# Patient Record
Sex: Male | Born: 1980 | Race: Black or African American | Hispanic: No | Marital: Single | Smoking: Current every day smoker
Health system: Southern US, Community
[De-identification: ages and names within clinical notes are randomized; demographics above are authoritative.]

## PROBLEM LIST (undated history)

## (undated) ENCOUNTER — Ambulatory Visit (HOSPITAL_COMMUNITY): Admission: EM | Payer: Self-pay

## (undated) ENCOUNTER — Ambulatory Visit (HOSPITAL_COMMUNITY): Admission: EM | Payer: MEDICAID

## (undated) ENCOUNTER — Ambulatory Visit (HOSPITAL_COMMUNITY): Admission: EM | Discharge status: Absent without leave | Payer: No Payment, Other

## (undated) ENCOUNTER — Emergency Department (HOSPITAL_COMMUNITY): Admission: EM | Payer: Self-pay

## (undated) ENCOUNTER — Emergency Department (HOSPITAL_COMMUNITY): Admission: EM | Payer: MEDICAID | Source: Home / Self Care

## (undated) DIAGNOSIS — Z8619 Personal history of other infectious and parasitic diseases: Secondary | ICD-10-CM

## (undated) DIAGNOSIS — I38 Endocarditis, valve unspecified: Secondary | ICD-10-CM

## (undated) DIAGNOSIS — B20 Human immunodeficiency virus [HIV] disease: Secondary | ICD-10-CM

## (undated) DIAGNOSIS — Z21 Asymptomatic human immunodeficiency virus [HIV] infection status: Secondary | ICD-10-CM

## (undated) DIAGNOSIS — R569 Unspecified convulsions: Secondary | ICD-10-CM

## (undated) DIAGNOSIS — B1920 Unspecified viral hepatitis C without hepatic coma: Secondary | ICD-10-CM

## (undated) HISTORY — DX: Personal history of other infectious and parasitic diseases: Z86.19

## (undated) HISTORY — PX: WRIST SURGERY: SHX841

## (undated) HISTORY — PX: OTHER SURGICAL HISTORY: SHX169

---

## 2002-04-05 ENCOUNTER — Emergency Department (HOSPITAL_COMMUNITY): Admission: EM | Admit: 2002-04-05 | Discharge: 2002-04-06 | Payer: Self-pay | Admitting: *Deleted

## 2003-11-26 ENCOUNTER — Emergency Department (HOSPITAL_COMMUNITY): Admission: EM | Admit: 2003-11-26 | Discharge: 2003-11-26 | Payer: Self-pay

## 2003-11-28 ENCOUNTER — Emergency Department (HOSPITAL_COMMUNITY): Admission: EM | Admit: 2003-11-28 | Discharge: 2003-11-28 | Payer: Self-pay | Admitting: Emergency Medicine

## 2006-10-09 ENCOUNTER — Emergency Department (HOSPITAL_COMMUNITY): Admission: EM | Admit: 2006-10-09 | Discharge: 2006-10-09 | Payer: Self-pay | Admitting: Emergency Medicine

## 2006-12-27 ENCOUNTER — Emergency Department (HOSPITAL_COMMUNITY): Admission: EM | Admit: 2006-12-27 | Discharge: 2006-12-28 | Payer: Self-pay | Admitting: Emergency Medicine

## 2007-01-07 ENCOUNTER — Emergency Department (HOSPITAL_COMMUNITY): Admission: EM | Admit: 2007-01-07 | Discharge: 2007-01-07 | Payer: Self-pay | Admitting: Emergency Medicine

## 2007-05-30 ENCOUNTER — Emergency Department (HOSPITAL_COMMUNITY): Admission: EM | Admit: 2007-05-30 | Discharge: 2007-05-30 | Payer: Self-pay | Admitting: Emergency Medicine

## 2009-07-04 ENCOUNTER — Emergency Department (HOSPITAL_COMMUNITY): Admission: EM | Admit: 2009-07-04 | Discharge: 2009-07-04 | Payer: Self-pay | Admitting: Emergency Medicine

## 2009-07-11 ENCOUNTER — Emergency Department (HOSPITAL_COMMUNITY): Admission: EM | Admit: 2009-07-11 | Discharge: 2009-07-11 | Payer: Self-pay | Admitting: Emergency Medicine

## 2010-01-24 ENCOUNTER — Emergency Department (HOSPITAL_COMMUNITY): Admission: EM | Admit: 2010-01-24 | Discharge: 2010-01-24 | Payer: Self-pay | Admitting: Emergency Medicine

## 2010-08-12 ENCOUNTER — Emergency Department (HOSPITAL_COMMUNITY)
Admission: EM | Admit: 2010-08-12 | Discharge: 2010-08-12 | Discharge status: Against Medical Advice | Payer: Self-pay | Attending: Emergency Medicine | Admitting: Emergency Medicine

## 2010-08-12 DIAGNOSIS — M25569 Pain in unspecified knee: Secondary | ICD-10-CM | POA: Insufficient documentation

## 2010-09-19 LAB — ETHANOL: Alcohol, Ethyl (B): <5 mg/dL (ref 0–10)

## 2010-09-19 LAB — POCT I-STAT, CHEM 8
BUN: 8 mg/dL (ref 6–23)
Calcium, Ion: 1.14 mmol/L (ref 1.12–1.32)
Chloride: 106 mEq/L (ref 96–112)
HCT: 49 % (ref 39.0–52.0)
Potassium: 3.9 mEq/L (ref 3.5–5.1)

## 2010-10-05 ENCOUNTER — Emergency Department (HOSPITAL_COMMUNITY): Payer: Self-pay

## 2010-10-05 ENCOUNTER — Emergency Department (HOSPITAL_COMMUNITY)
Admission: EM | Admit: 2010-10-05 | Discharge: 2010-10-05 | Disposition: A | Discharge status: Home / Self Care | Payer: Self-pay | Attending: Emergency Medicine | Admitting: Emergency Medicine

## 2010-10-05 DIAGNOSIS — M542 Cervicalgia: Secondary | ICD-10-CM | POA: Insufficient documentation

## 2010-10-05 DIAGNOSIS — S0083XA Contusion of other part of head, initial encounter: Secondary | ICD-10-CM | POA: Insufficient documentation

## 2010-10-05 DIAGNOSIS — R51 Headache: Secondary | ICD-10-CM | POA: Insufficient documentation

## 2010-10-05 DIAGNOSIS — Y9229 Other specified public building as the place of occurrence of the external cause: Secondary | ICD-10-CM | POA: Insufficient documentation

## 2010-10-05 DIAGNOSIS — IMO0002 Reserved for concepts with insufficient information to code with codable children: Secondary | ICD-10-CM | POA: Insufficient documentation

## 2010-10-05 DIAGNOSIS — M546 Pain in thoracic spine: Secondary | ICD-10-CM | POA: Insufficient documentation

## 2010-10-05 DIAGNOSIS — S0003XA Contusion of scalp, initial encounter: Secondary | ICD-10-CM | POA: Insufficient documentation

## 2010-12-11 ENCOUNTER — Emergency Department (HOSPITAL_COMMUNITY)
Admission: EM | Admit: 2010-12-11 | Discharge: 2010-12-11 | Disposition: A | Discharge status: Home / Self Care | Payer: Self-pay | Attending: Emergency Medicine | Admitting: Emergency Medicine

## 2010-12-11 DIAGNOSIS — IMO0002 Reserved for concepts with insufficient information to code with codable children: Secondary | ICD-10-CM | POA: Insufficient documentation

## 2010-12-11 DIAGNOSIS — Y92009 Unspecified place in unspecified non-institutional (private) residence as the place of occurrence of the external cause: Secondary | ICD-10-CM | POA: Insufficient documentation

## 2010-12-11 DIAGNOSIS — S058X9A Other injuries of unspecified eye and orbit, initial encounter: Secondary | ICD-10-CM | POA: Insufficient documentation

## 2011-04-12 LAB — COMPREHENSIVE METABOLIC PANEL
Albumin: 4.9
BUN: 13
CO2: 25
Glucose, Bld: 74
Potassium: 4
Sodium: 139
Total Protein: 7.9

## 2011-04-12 LAB — CBC
HCT: 45.5
Hemoglobin: 15.9
MCHC: 34.9
MCV: 93.6
Platelets: 184
RBC: 4.87

## 2011-04-12 LAB — URINALYSIS, ROUTINE W REFLEX MICROSCOPIC
Glucose, UA: NEGATIVE
Nitrite: NEGATIVE
Urobilinogen, UA: 1
pH: 5.5

## 2011-04-12 LAB — LIPASE, BLOOD: Lipase: 15

## 2011-04-12 LAB — DIFFERENTIAL
Basophils Absolute: 0
Basophils Relative: 0
Eosinophils Absolute: 0 — ABNORMAL LOW
Eosinophils Relative: 0

## 2012-05-20 ENCOUNTER — Encounter (HOSPITAL_COMMUNITY): Payer: Self-pay | Admitting: Emergency Medicine

## 2012-05-20 ENCOUNTER — Emergency Department (HOSPITAL_COMMUNITY)
Admission: EM | Admit: 2012-05-20 | Discharge: 2012-05-20 | Disposition: A | Discharge status: Home / Self Care | Payer: Self-pay | Attending: Emergency Medicine | Admitting: Emergency Medicine

## 2012-05-20 DIAGNOSIS — S1093XA Contusion of unspecified part of neck, initial encounter: Secondary | ICD-10-CM | POA: Insufficient documentation

## 2012-05-20 DIAGNOSIS — S0083XA Contusion of other part of head, initial encounter: Secondary | ICD-10-CM

## 2012-05-20 DIAGNOSIS — S0501XA Injury of conjunctiva and corneal abrasion without foreign body, right eye, initial encounter: Secondary | ICD-10-CM

## 2012-05-20 DIAGNOSIS — S51811A Laceration without foreign body of right forearm, initial encounter: Secondary | ICD-10-CM

## 2012-05-20 DIAGNOSIS — S058X9A Other injuries of unspecified eye and orbit, initial encounter: Secondary | ICD-10-CM | POA: Insufficient documentation

## 2012-05-20 DIAGNOSIS — S0003XA Contusion of scalp, initial encounter: Secondary | ICD-10-CM | POA: Insufficient documentation

## 2012-05-20 MED ORDER — PREDNISOLONE ACETATE 1 % OP SUSP
1.0000 [drp] | Freq: Once | OPHTHALMIC | Status: AC
  Administered 2012-05-20: 1 [drp] via OPHTHALMIC
  Filled 2012-05-20: qty 1

## 2012-05-20 MED ORDER — TETRACAINE HCL 0.5 % OP SOLN
1.0000 [drp] | Freq: Once | OPHTHALMIC | Status: AC
  Administered 2012-05-20: 1 [drp] via OPHTHALMIC
  Filled 2012-05-20: qty 2

## 2012-05-20 MED ORDER — BACITRACIN ZINC 500 UNIT/GM EX OINT
1.0000 "application " | TOPICAL_OINTMENT | Freq: Two times a day (BID) | CUTANEOUS | Status: DC
  Administered 2012-05-20: 1 via TOPICAL

## 2012-05-20 MED ORDER — LIDOCAINE-EPINEPHRINE 1 %-1:100000 IJ SOLN
10.0000 mL | Freq: Once | INTRAMUSCULAR | Status: AC
  Administered 2012-05-20: 10 mL
  Filled 2012-05-20: qty 10

## 2012-05-20 MED ORDER — CIPROFLOXACIN HCL 0.3 % OP SOLN
1.0000 [drp] | Freq: Once | OPHTHALMIC | Status: AC
  Administered 2012-05-20: 1 [drp] via OPHTHALMIC
  Filled 2012-05-20: qty 2.5

## 2012-05-20 MED ORDER — OXYCODONE-ACETAMINOPHEN 5-325 MG PO TABS
2.0000 | ORAL_TABLET | Freq: Once | ORAL | Status: AC
  Administered 2012-05-20: 2 via ORAL
  Filled 2012-05-20: qty 2

## 2012-05-20 MED ORDER — FLUORESCEIN SODIUM 1 MG OP STRP
1.0000 | ORAL_STRIP | Freq: Once | OPHTHALMIC | Status: AC
  Administered 2012-05-20: 1 via OPHTHALMIC
  Filled 2012-05-20: qty 2

## 2012-05-20 MED ORDER — OXYCODONE-ACETAMINOPHEN 5-325 MG PO TABS: 1.0000 | ORAL_TABLET | ORAL | Status: DC | PRN
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
## 2013-09-12 ENCOUNTER — Encounter (HOSPITAL_BASED_OUTPATIENT_CLINIC_OR_DEPARTMENT_OTHER): Payer: Self-pay | Admitting: Emergency Medicine

## 2013-09-12 ENCOUNTER — Emergency Department (HOSPITAL_BASED_OUTPATIENT_CLINIC_OR_DEPARTMENT_OTHER)
Admission: EM | Admit: 2013-09-12 | Discharge: 2013-09-12 | Disposition: A | Discharge status: Home / Self Care | Payer: Self-pay | Attending: Emergency Medicine | Admitting: Emergency Medicine

## 2013-09-12 DIAGNOSIS — K1120 Sialoadenitis, unspecified: Secondary | ICD-10-CM | POA: Insufficient documentation

## 2013-09-12 MED ORDER — CLINDAMYCIN HCL 150 MG PO CAPS
300.0000 mg | ORAL_CAPSULE | Freq: Once | ORAL | Status: AC
  Administered 2013-09-12: 300 mg via ORAL
  Filled 2013-09-12: qty 2

## 2013-09-12 MED ORDER — IBUPROFEN 800 MG PO TABS
800.0000 mg | ORAL_TABLET | Freq: Once | ORAL | Status: AC
  Administered 2013-09-12: 800 mg via ORAL
  Filled 2013-09-12: qty 1

## 2013-09-12 MED ORDER — CLINDAMYCIN HCL 300 MG PO CAPS: 300.0000 mg | ORAL_CAPSULE | Freq: Three times a day (TID) | ORAL | Status: DC

## 2013-09-12 MED ORDER — IBUPROFEN 800 MG PO TABS: 800.0000 mg | ORAL_TABLET | Freq: Three times a day (TID) | ORAL | Status: DC

## 2013-09-12 NOTE — ED Provider Notes (Signed)
CSN: [id number]     Arrival date & time 09/12/13  1604 History   First MD Initiated Contact with Patient 09/12/13 1607     Chief Complaint  Patient presents with  . Oral Swelling     (Consider location/radiation/quality/duration/timing/severity/associated sxs/prior Treatment) HPI Pt presents with c/o pain in tongue and swelling of tongue  Symptoms have been ongoing for the past 3 days.  Pt has not tried anything for his symptoms.  States he sees a small dot on the under side of his tongue.  No sore throat.  No itching or rash, no lip swelling.  No new substances ingested.  Pt endorses drinking alcohol today- his speech is slurred due to intoxication.  Not due to tongue abnormalities.  No fever, no drainage. Pain is worse with palpation of tongue.   There are no other associated systemic symptoms, there are no other alleviating or modifying factors.   History reviewed. No pertinent past medical history. History reviewed. No pertinent past surgical history. No family history on file. History  Substance Use Topics  . Smoking status: Never Smoker   . Smokeless tobacco: Not on file  . Alcohol Use: Yes    Review of Systems ROS reviewed and all otherwise negative except for mentioned in HPI    Allergies  Review of patient's allergies indicates no known allergies.  Home Medications   Current Outpatient Rx  Name  Route  Sig  Dispense  Refill  . clindamycin (CLEOCIN) 300 MG capsule   Oral   Take 1 capsule (300 mg total) by mouth 3 (three) times daily.   30 capsule   0   . ibuprofen (ADVIL,MOTRIN) 800 MG tablet   Oral   Take 1 tablet (800 mg total) by mouth 3 (three) times daily.   21 tablet   0   . oxyCODONE-acetaminophen (PERCOCET) 5-325 MG per tablet   Oral   Take 1 tablet by mouth every 4 (four) hours as needed for pain.   20 tablet   0    BP 123/78  Pulse 88  Temp(Src) 98.5 F (36.9 C) (Oral)  Resp 20  Ht 5' 11.5" (1.816 m)  Wt 180 lb (81.647 kg)  BMI 24.76  kg/m2  SpO2 98% Vitals reviewed Physical Exam Physical Examination: General appearance - alert, well appearing, and in no distress Mental status - alert, oriented to person, place, and time Eyes - no conjunctival injection, no scleral icterus Mouth - mucous membranes moist, pharynx normal without lesions, tongue with approx 1cm area of swelling towards tip of tongue, on underside of tongue there is small pinpoint dot with some surrounding erythema, area is tender to palpation, no swelling under the tongue, OP clear, no swelling of uvula or remainder of tongue Neck - supple, no significant adenopathy Chest - clear to auscultation, no wheezes, rales or rhonchi, symmetric air entry Heart - normal rate, regular rhythm, normal S1, S2, no murmurs, rubs, clicks or gallops Extremities - peripheral pulses normal, no pedal edema, no clubbing or cyanosis Skin - normal coloration and turgor, no rashes Psych- intoxicated appearing  ED Course  Procedures (including critical care time)  4:46 PM pt requesting narcotic pain medication.  I have explained to patient that I am giving him ibuprofen to address his pain.  I do not feel comfortable prescribing a narcotic pain medication for his symptoms today. I have directly and openly discussed this with patient.  Of note, pt endorses drinking alcohol today and appears somewhat intoxicated making narcotics even  less desirable clinically.  He states that he needs "immediate gratification" and that he is going to call his mother about not being prescribed narcotics.  I have discussed with him that he should take ibuprofen three times daily, warm compresses, sour candy, and anbitiocs as prescribed for his symptoms- the goal being to reduce his pain and infection.  He was also given ENT followup information if symptoms persist.  Labs Review Labs Reviewed - No data to display Imaging Review No results found.   EKG Interpretation None      MDM   Final diagnoses:   Sialadenitis      Pt treated with ibuprofen and given rx for clindamycin for likely sialedinitis.  Pt in no acute distress, maintaining his airway, tongue swelling is localized to tip of tongue.  See note above, pt requesting narcotics but due to his relatively minor complaint in addition to appearing intoxicated I do not feel comfortable giving this patient narcotic pain medications.  I feel that ibuprofen may actually help his sympotm more due to its anti inflammatory properties.  PT given information for ENT followup.  Discharged with strict return precautions.  Pt agreeable with plan.  Ethelda ChickMartha K Linker, MD 09/16/13 [phone]

## 2013-09-12 NOTE — ED Notes (Signed)
Tongue swelling x 3 days. Sore throat. Speech is slurred. He admits to alcohol today.

## 2013-09-12 NOTE — Discharge Instructions (Signed)
Return to the ED with any concerns including fever/chills, difficulty breathing or swallowing, decreased level of alertness/lethargy, or any other alarming symptoms

## 2014-06-26 ENCOUNTER — Encounter (HOSPITAL_BASED_OUTPATIENT_CLINIC_OR_DEPARTMENT_OTHER): Payer: Self-pay | Admitting: *Deleted

## 2014-06-26 ENCOUNTER — Emergency Department (HOSPITAL_BASED_OUTPATIENT_CLINIC_OR_DEPARTMENT_OTHER): Payer: Self-pay

## 2014-06-26 ENCOUNTER — Emergency Department (HOSPITAL_BASED_OUTPATIENT_CLINIC_OR_DEPARTMENT_OTHER)
Admission: EM | Admit: 2014-06-26 | Discharge: 2014-06-26 | Disposition: A | Discharge status: Home / Self Care | Payer: Self-pay | Attending: Emergency Medicine | Admitting: Emergency Medicine

## 2014-06-26 DIAGNOSIS — T1490XA Injury, unspecified, initial encounter: Secondary | ICD-10-CM

## 2014-06-26 DIAGNOSIS — S92251A Displaced fracture of navicular [scaphoid] of right foot, initial encounter for closed fracture: Secondary | ICD-10-CM | POA: Insufficient documentation

## 2014-06-26 DIAGNOSIS — Y9371 Activity, boxing: Secondary | ICD-10-CM | POA: Insufficient documentation

## 2014-06-26 DIAGNOSIS — Y998 Other external cause status: Secondary | ICD-10-CM | POA: Insufficient documentation

## 2014-06-26 DIAGNOSIS — S62001A Unspecified fracture of navicular [scaphoid] bone of right wrist, initial encounter for closed fracture: Secondary | ICD-10-CM

## 2014-06-26 DIAGNOSIS — Z792 Long term (current) use of antibiotics: Secondary | ICD-10-CM | POA: Insufficient documentation

## 2014-06-26 DIAGNOSIS — Y9239 Other specified sports and athletic area as the place of occurrence of the external cause: Secondary | ICD-10-CM | POA: Insufficient documentation

## 2014-06-26 DIAGNOSIS — X58XXXA Exposure to other specified factors, initial encounter: Secondary | ICD-10-CM | POA: Insufficient documentation

## 2014-06-26 MED ORDER — HYDROCODONE-ACETAMINOPHEN 5-325 MG PO TABS: 1.0000 | ORAL_TABLET | Freq: Four times a day (QID) | ORAL | Status: DC | PRN

## 2014-06-26 MED ORDER — HYDROCODONE-ACETAMINOPHEN 5-325 MG PO TABS
1.0000 | ORAL_TABLET | Freq: Once | ORAL | Status: AC
  Administered 2014-06-26: 1 via ORAL

## 2014-06-26 MED ORDER — HYDROCODONE-ACETAMINOPHEN 5-325 MG PO TABS
ORAL_TABLET | ORAL | Status: AC
  Filled 2014-06-26: qty 1
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
## 2014-06-26 NOTE — ED Notes (Signed)
Pt amb to room 1 with quick steady gait in nad. Pt reports injuring his right wrist while practicing boxing moves this morning. Denies any other c/o.

## 2014-06-26 NOTE — ED Notes (Signed)
Pt cont await his ride home. States his pain is "much better".

## 2014-06-26 NOTE — ED Provider Notes (Addendum)
CSN: [id number]     Arrival date & time 06/26/14  1026 History   First MD Initiated Contact with Patient 06/26/14 1051     Chief Complaint  Patient presents with  . Wrist Pain     (Consider location/radiation/quality/duration/timing/severity/associated sxs/prior Treatment) Patient is a 33 y.o. male presenting with wrist pain. The history is provided by the patient.  Wrist Pain This is a new (was boxing with gloves and went to throw a punch and developed severe pain in the right wrist) problem. The current episode started less than 1 hour ago. The problem occurs constantly. The problem has not changed since onset.Associated symptoms comments: Pain and swelling in the right wrist.  Mild tingling in the fingers.  No elbow or shoulder pain. The symptoms are aggravated by bending and twisting. Nothing relieves the symptoms. He has tried rest for the symptoms. The treatment provided no relief.    History reviewed. No pertinent past medical history. History reviewed. No pertinent past surgical history. History reviewed. No pertinent family history. History  Substance Use Topics  . Smoking status: Never Smoker   . Smokeless tobacco: Not on file  . Alcohol Use: Yes    Review of Systems  All other systems reviewed and are negative.     Allergies  Review of patient's allergies indicates no known allergies.  Home Medications   Prior to Admission medications   Medication Sig Start Date End Date Taking? Authorizing Provider  clindamycin (CLEOCIN) 300 MG capsule Take 1 capsule (300 mg total) by mouth 3 (three) times daily. 09/12/13   Ethelda ChickMartha K Linker, MD  ibuprofen (ADVIL,MOTRIN) 800 MG tablet Take 1 tablet (800 mg total) by mouth 3 (three) times daily. 09/12/13   Ethelda ChickMartha K Linker, MD  oxyCODONE-acetaminophen (PERCOCET) 5-325 MG per tablet Take 1 tablet by mouth every 4 (four) hours as needed for pain. 05/20/12   Vida RollerBrian D Miller, MD   There were no vitals taken for this visit. Physical Exam    Constitutional: He is oriented to person, place, and time. He appears well-developed and well-nourished. No distress.  HENT:  Head: Normocephalic and atraumatic.  Eyes: EOM are normal. Pupils are equal, round, and reactive to light.  Cardiovascular: Normal rate and regular rhythm.   Pulmonary/Chest: Effort normal.  Musculoskeletal:       Right wrist: He exhibits decreased range of motion, tenderness, bony tenderness, swelling and deformity.       Arms: Neurological: He is alert and oriented to person, place, and time.  Skin: Skin is warm and dry.  Psychiatric: He has a normal mood and affect.  Nursing note and vitals reviewed.   ED Course  Procedures (including critical care time) Labs Review Labs Reviewed - No data to display  Imaging Review Dg Wrist Complete Right  06/26/2014   CLINICAL DATA:  Pain following boxing injury  EXAM: RIGHT WRIST - COMPLETE 3+ VIEW  COMPARISON:  January 24, 2010.  FINDINGS: Frontal, oblique, lateral, and ulnar deviation scaphoid images were obtained. There is a lucency in the mid scaphoid region, concerning for nondisplaced fracture in this area. No other evidence of potential fracture. No dislocation. Joint spaces appear intact. No erosive change.  IMPRESSION: Lucency mid scaphoid concerning for nondisplaced fracture in this area. No other evidence of fracture. No dislocation.   Electronically Signed   By: Bretta BangWilliam  Woodruff M.D.   On: 06/26/2014 11:07     EKG Interpretation None      MDM   Final diagnoses:  Trauma  Scaphoid fracture of wrist, right, closed, initial encounter    Patient with an injury to his wrist when he was boxing today. Neurovascularly intact but significant swelling over the distal right radius. Plain films pending.  11:18 AM Pt with scaphoid fracture.  Placed in thumb spica and f/u with Dr. Elesa HackerHudnall  Justin Sternberg, MD 06/26/14 1118  Justin SproutWhitney Krzysztof Reichelt, MD 06/26/14 1120

## 2014-07-08 ENCOUNTER — Encounter (HOSPITAL_COMMUNITY): Payer: Self-pay | Admitting: Emergency Medicine

## 2014-07-08 ENCOUNTER — Telehealth: Payer: Self-pay | Admitting: Infectious Disease

## 2014-07-08 ENCOUNTER — Emergency Department (HOSPITAL_COMMUNITY)
Admission: EM | Admit: 2014-07-08 | Discharge: 2014-07-08 | Disposition: A | Discharge status: Home / Self Care | Payer: Self-pay | Attending: Emergency Medicine | Admitting: Emergency Medicine

## 2014-07-08 DIAGNOSIS — Z21 Asymptomatic human immunodeficiency virus [HIV] infection status: Secondary | ICD-10-CM

## 2014-07-08 DIAGNOSIS — Z792 Long term (current) use of antibiotics: Secondary | ICD-10-CM | POA: Insufficient documentation

## 2014-07-08 DIAGNOSIS — B20 Human immunodeficiency virus [HIV] disease: Secondary | ICD-10-CM

## 2014-07-08 DIAGNOSIS — Z79899 Other long term (current) drug therapy: Secondary | ICD-10-CM | POA: Insufficient documentation

## 2014-07-08 DIAGNOSIS — K1379 Other lesions of oral mucosa: Secondary | ICD-10-CM | POA: Insufficient documentation

## 2014-07-08 DIAGNOSIS — K1370 Unspecified lesions of oral mucosa: Secondary | ICD-10-CM

## 2014-07-08 DIAGNOSIS — Z791 Long term (current) use of non-steroidal anti-inflammatories (NSAID): Secondary | ICD-10-CM | POA: Insufficient documentation

## 2014-07-08 LAB — RAPID HIV SCREEN (WH-MAU): Rapid HIV Screen: REACTIVE — AB

## 2014-07-08 LAB — MONONUCLEOSIS SCREEN: MONO SCREEN: NEGATIVE

## 2014-07-08 MED ORDER — LIDOCAINE VISCOUS 2 % MT SOLN
15.0000 mL | Freq: Once | OROMUCOSAL | Status: AC
  Administered 2014-07-08: 15 mL via OROMUCOSAL
  Filled 2014-07-08: qty 15

## 2014-07-08 MED ORDER — MAGIC MOUTHWASH: 5.0000 mL | Freq: Three times a day (TID) | ORAL | Status: DC | PRN

## 2014-07-08 MED ORDER — ACETAMINOPHEN 500 MG PO TABS
1000.0000 mg | ORAL_TABLET | Freq: Once | ORAL | Status: AC
  Administered 2014-07-08: 1000 mg via ORAL
  Filled 2014-07-08: qty 2

## 2014-07-08 NOTE — ED Notes (Addendum)
Pt sts over past week he has been having increasing mouth and throat pain. Multiple ulcers noted to mucous membranes. Airway intact. Pt speaking in full sentences.

## 2014-07-08 NOTE — Discharge Instructions (Signed)
You may mix Benadryl and Maalox 1-1 liquid solution, swish in your mouth and spit for oral lesions and discomfort in your mouth. Follow-up with infectious diseases and with the [facility]. Return to the ER with any worsening of symptoms.   AIDS AIDS (Acquired Immune Deficiency Syndrome) is a severe viral infection caused by the Human Immunodeficiency Virus (HIV). This virus destroys a person's resistance to disease and certain cancers. It is transmitted through blood, blood products, and body fluids. Although the fear of AIDS has grown faster than the epidemic, it is important to note that AIDS is not spread by casual contact and is easily killed by hot water, soap, bleach, and most antiseptics. HOW THE AIDS INFECTION WORKS Once HIV enters the body it affects T-helper (T-4) lymphocytes. These are white blood cells that are crucial for the immune system. Once they become infected, they become factories for producing the AIDS Virus. Eventually these infected T-4 cells die. This leaves the victim susceptible to infection and certain cancers. While anyone can get AIDS, you are unlikely to get this disease unless you indulge in high-risk behavior. Some of these high-risk behaviors are promiscuous sex, having close relationships with HIV-positive people, and the sharing of needles. This illness was initially more common in homosexual men, but as time progresses, it will most probably affect an equal number of men and women. Babies born to women who are infected, have greater chances of getting AIDS. Infection with HIV may cause a brief, mild illness with fever several weeks after contact. More serious symptoms do not develop until months or years later, so a person can be infected with the virus without showing any symptoms at all. At this stage of the infection there is no way of knowing you are infected unless you have a blood or mouth scraping test for the AIDS virus. SYMPTOMS    Fevers, night sweats, general weakness, enlarged lymph nodes.  Chest pain, pneumonia, chronic cough, shortness of breath.  Weight loss, diarrhea, difficulty swallowing, rectal problems.  Headaches, personality changes, problems with vision and memory.  Skin tumors (patchy dark areas) and infections. There is no cure or vaccine for AIDS at the present time. Anti-viral antibiotic drugs have been shown to stop the virus from multiplying, which helps prolong health. The best treatment against AIDS is prevention. If you have been infected with HIV, careful medical follow-up with regular blood tests is necessary.  Do not take part in risky behaviors. These include sharing needles and syringes or other sharp instruments like razors with others, having unprotected sex with high-risk people (homosexuals, bisexuals, or prostitutes), and engaging in anal sex (with or without a condom).  For further information about AIDS, please call your caregiver, the health department, or the Center for Disease Control: 800-342-AIDS. MISCONCEPTIONS ABOUT AIDS  You can not get AIDS through casual contact. This includes sitting next to an AIDS infected person, being coughed on, living with, swimming with, eating food prepared by, sitting or lying next to someone with AIDS.  It is not caught from toilet seats, from showers, bath tubs, water fountains, phones, drinking glasses, or food touched or used by people with AIDS. Casual kissing will probably not transmit the disease. "[place] kissing" (putting one's tongue in another's mouth) is probably not a good idea as the AIDS Virus is present in saliva.  You will not get AIDS by donating blood. The needles used by blood banks are sterile and disposable.  There is no evidence that AIDS  is transmitted through tears.  AIDS is not caught through mosquitoes.  Children with AIDS will not pass AIDS to other children in school without exchange of blood products or engaging in  sex. In school, a child with AIDS is actually at greater risk because of their weak immune status. Their susceptibility to the viruses and germs (bacteria) carried by children without AIDS is great. TESTING FOR AIDS  An ELISA (enzyme-linked immunoabsorbent assay) is a blood test available to let you know if you have contracted AIDS. If this test is positive, it is usually repeated.  If positive a second time, a second test known as the Western blot test is performed. If the Western blot test is positive, it means you have been infected with HIV. PREVENTION  The best way to prevent AIDS is to avoid high-risk behavior.  The outlook for defeating AIDS is good. Millions of research dollars are being spent on creating a vaccine to prevent the disease as well as providing a cure. New drugs appear to be extremely effective at controlling the disease.  Your caregiver will educate you in all the most effective and current treatments. Document Released: 06/17/2000 Document Revised: 09/12/2011 Document Reviewed: 06/13/2008 [facility] Patient Information 2015 [place], [place]. This information is not intended to replace advice given to you by your health care provider. Make sure you discuss any questions you have with your health care provider.   Emergency Department Resource Guide 1) Find a Doctor and Pay Out of Pocket Although you won't have to find out who is covered by your insurance plan, it is a good idea to ask around and get recommendations. You will then need to call the office and see if the doctor you have chosen will accept you as a new patient and what types of options they offer for patients who are self-pay. Some doctors offer discounts or will set up payment plans for their patients who do not have insurance, but you will need to ask so you aren't surprised when you get to your appointment.  2) Contact Your Local Health Department Not all health departments have doctors that can see patients  for sick visits, but many do, so it is worth a call to see if yours does. If you don't know where your local health department is, you can check in your phone book. The CDC also has a tool to help you locate your state's health department, and many state websites also have listings of all of their local health departments.  3) Find a Walk-in Clinic If your illness is not likely to be very severe or complicated, you may want to try a walk in clinic. These are popping up all over the country in pharmacies, drugstores, and shopping centers. They're usually staffed by nurse practitioners or physician assistants that have been trained to treat common illnesses and complaints. They're usually fairly quick and inexpensive. However, if you have serious medical issues or chronic medical problems, these are probably not your best option.  No Primary Care Doctor: - Call Health Connect at  [phone] - they can help you locate a primary care doctor that  accepts your insurance, provides certain services, etc. - Physician Referral Service- [phone]  Chronic Pain Problems: Organization         Address  Phone   Notes  [facility]  [phone] Patients need to be referred by their primary care doctor.   Medication Assistance: Organization  Address  Phone   Notes  [facility] Medication [facility] [address] [place], [place] [address] [phone] --Must be a resident of [facility] [place] -- Must have NO insurance coverage whatsoever (no Medicaid/ Medicare, etc.) -- The pt. MUST have a primary care doctor that directs their care regularly and follows them in the community   MedAssist  [phone]   [employer]  [phone]    Agencies that provide inexpensive medical care: Organization         Address  Phone   Notes  [facility]  [phone]   [facility]    [phone]) [phone]   [facility] [address] [place], [place] [address] [phone]   [facility] [address], [place] [phone]   Planned Parenthood    [phone](336) [phone]   [facility]    [phone]   Community Health and [facility]  [address], [place] Phone:  [phone], Fax:  [phone]) [phone] Hours of Operation:  9 am - 6 pm, M-F.  Also accepts Medicaid/Medicare and self-pay.  [facility]  [address], [place] Phone: [phone], Fax: [phone]) [phone]. Hours of Operation:  8:30 am - 5:30 pm, M-F.  Also accepts Medicaid and self-pay.  [facility] [place] [address], [place] Phone: [phone]) [phone]   [facility] [address], [place], [place] [phone], Ext. [phone] Mondays & Thursdays: 7-9 AM.  First 15 patients are seen on a first come, first serve basis.    Medicaid-accepting [facility] Providers:  Organization         Address  Phone   Notes  [facility] [address], [place] [phone] Also accepts self-pay patients.  [facility] [address], [place]  [phone]   [facility] [address], [place] [phone]   [facility] [address] [place] [address], [place] [phone]) [phone]   Renaye RakersVeita Bland [address], [place]   [phone] Only accepts [place] Access [place] patients after they have their name applied to their card.   Self-Pay (no insurance) in [facility]:  Organization         Address  Phone   Notes  Sickle Cell Patients, [facility] [address], [place] [phone]) [phone]   [facility] [address], [place] [phone]   [facility] [place]  [address],  [phone]) [phone]   [facility]/Dr. Osei-Bonsu  [address] [place]  [place] [address], [place] or [id number], [place] [phone] Phone number for both [place] and [place] locations is the same.  Urgent Medical and [facility] [address], [place] [phone]   [facility] [address] [place] [address], [place] or [address] [phone]) [phone]) [phone]   [facility] [address], [place] [phone]) [phone], phone; [phone], fax Sees patients 1st and 3rd Saturday of every month.  Must not qualify for public or private insurance (i.e. Medicaid, Medicare, [place] Health Choice, Veterans' Benefits)  Household income should be no more than 200% of the poverty level The clinic cannot treat you if you are pregnant or think you  are pregnant  Sexually transmitted diseases are not treated at the clinic.    Dental Care: Organization         Address  Phone  Notes  [facility] of [facility] [address] [place], [place] [phone] Accepts children up to age 10 who are enrolled in [place] or [place] Health Choice; pregnant women with a Medicaid card; and children who have applied for Medicaid or [place] Health Choice, but were declined, whose parents can pay a reduced fee at time of service.  [facility] of [facility]  [address], [place] [phone] Accepts children up to age 30 who are enrolled in [place] or [place] Health Choice; pregnant women with a Medicaid card; and children who have applied for Medicaid or  Health Choice, but were declined, whose parents can pay a reduced fee at time of service.  [facility]  [address] [place], [place] [phone] Patients are seen by appointment only. Walk-ins are not accepted. [facility] will see patients 10 years of age and older. Monday - Tuesday (8am-5pm) Most Wednesdays (8:30-5pm) $30 per visit, cash only  [facility] Adult Dental Access PROGRAM  [address], [facility] [phone] Patients are seen by appointment only. Walk-ins are not accepted. [facility] will see patients 62 years of age and older. One Wednesday Evening (Monthly: Volunteer Based).  $30 per visit, cash only  [employer]  [phone] for adults; Children under age 67, call Graduate Pediatric Dentistry at [phone]. Children aged 53-14, please call [phone] to request a pediatric application.  Dental services are provided in all areas of dental care including fillings, crowns and bridges, complete and partial dentures, implants, gum treatment, root canals, and extractions. Preventive care is also provided. Treatment is provided to both adults and children. Patients are selected via a lottery and there is often a waiting list.   [facility] [address] [place]  [phone] [email or web address]   [facility] [address] [place], [place] [phone], Ext. [phone] Second and Fourth Thursday of each month, opens at 6:30 AM; Clinic ends at 9 AM.  Patients are seen on a first-come first-served basis, and a limited number are seen during each clinic.   [facility]  [address] [place], [place] [phone]   Eligibility Requirements You must have lived in [place], [place], or [place] counties for at least the last three months.   You cannot be eligible for state or federal sponsored [place], including [employer], [place], or [employer].   You generally cannot be eligible for healthcare insurance through your employer.    How to apply: Eligibility screenings are held every Tuesday and Wednesday afternoon from 1:00 pm until 4:00 pm. You do not need an appointment for the interview!  [facility] [address] [place], [place] [phone]   [facility] Health Department  [phone]   [facility] Health Department  [phone]    [facility] Health Department  [phone]    Behavioral Health Resources in the Community: Intensive Outpatient Programs Organization         Address  Phone  Notes  [facility] [address] [place], [place] [phone]   [facility] Outpatient [address] [place], [place] [phone]   ADS: Alcohol & Drug Svcs [address]  Dr, [place], [place]   [address] [place] [place] [address],  [place], [place] or [phone]   Substance Abuse Resources Organization         Address  Phone  Notes  Alcohol and Drug Services  [phone]   [place]  [phone]   [facility]   Chinita Pester  [phone]   Residential & Outpatient Substance Abuse Program  [phone]   Psychological Services Organization         Address  Phone  Notes  [facility]  [place]  [phone]   [place] [address], [place] or [phone]    Mobile Crisis Teams Organization         Address  Phone  Notes  [facility], Mobile Crisis Care Unit  [phone]   [facility]  [address] [place], [place]   [place] Levels [address], [place] [phone]    Self-Help/Support Groups Organization         Address  Phone             Notes  [place] of [place] - variety of support groups  [place] Call for more information  Narcotics Anonymous (NA), Caring Services [address], [employer] [place]  2 meetings at this location   Special educational needs teacher         Address  Phone  Notes  ASAP [facility]idential [facility] [place],    [place]  [phone]   [facility]  [address] [place] [address], [place], [place]   Ewing Worden, [place]  [phone] Admissions: 8am-3pm M-F  Incentives Substance [place] [address],    [place], [place] [phone]   [facility] [address] [place], [place], [place]   [facility] [address]  [place], [place]   Insight Programs - Intensive Outpatient [address], [place], [place]   [facility] ([place] [address].,  [place], [place] [phone] or [phone]   [facility] Services (RTS) [address], [place], [place] Accepts Medicaid  [facility] [address]  [place] [phone] Substance Abuse/Addiction Treatment   [facility] Organization         Address  Phone  Notes  [facility]  [phone]   Domenic Schwab, PhD [address] Arlis Porta [place], [place]   [phone] or [phone]   [place], [place] [phone]   [facility] [address] [place], [place] [phone] Insurance/Medicaid/sponsorship through [facility] and Families [address]                                    [place], [place] [phone] [place] [address] [place], [place] [phone]    Dr. Adele Schilder  [phone]   [facility] of National Dept. 1) [address] [place] 2) [place] 3)  [place] [address], [place] [phone]  [phone]   [place] [phone])  [phone] or [phone] (After Hours)    ° ° ° °

## 2014-07-08 NOTE — ED Provider Notes (Signed)
CSN: [id number]     Arrival date & time 07/08/14  1645 History  This chart was scribed for non-physician practitioner, Jinny Sanders, PA-C, working with Benny Lennert, MD, by Bronson Curb, ED Scribe. This patient was seen in room TR09C/TR09C and the patient's care was started at 5:23 PM.     Chief Complaint  Patient presents with  . Mouth Lesions     The history is provided by the patient. No language interpreter was used.     HPI Comments: Justin [name] is a 34 y.o. male, with no significant medical history, who presents to the Emergency Department complaining of gradually worsening, constant burning/tingling sensation in the mouth for the past week. Patient notes associated tongue swelling and painful, "white" mouth lesions that appeared in the last 24 hours. He states he feels as though his tounge is "blocking his airway". He denies history of prior similar episodes. Patient states he is currently sexually active with 4 different partners, both male and male. He denies fever or abdominal pain. Patient is UTD on his immunizations and states he is very healthy and maintains proper oral hygiene.   History reviewed. No pertinent past medical history. History reviewed. No pertinent past surgical history. No family history on file. History  Substance Use Topics  . Smoking status: Never Smoker   . Smokeless tobacco: Not on file  . Alcohol Use: Yes    Review of Systems  Constitutional: Negative for fever.  HENT: Positive for mouth sores.        Tongue sores  Gastrointestinal: Negative for abdominal pain.      Allergies  Review of patient's allergies indicates no known allergies.  Home Medications   Prior to Admission medications   Medication Sig Start Date End Date Taking? Authorizing Provider  Alum & Mag Hydroxide-Simeth (MAGIC MOUTHWASH) SOLN Take 5 mLs by mouth 3 (three) times daily as needed for mouth pain. 07/08/14   Monte Fantasia, PA-C  clindamycin (CLEOCIN) 300 MG  capsule Take 1 capsule (300 mg total) by mouth 3 (three) times daily. 09/12/13   Ethelda Chick, MD  HYDROcodone-acetaminophen (NORCO/VICODIN) 5-325 MG per tablet Take 1-2 tablets by mouth every 6 (six) hours as needed for moderate pain or severe pain. 06/26/14   Gwyneth Sprout, MD  ibuprofen (ADVIL,MOTRIN) 800 MG tablet Take 1 tablet (800 mg total) by mouth 3 (three) times daily. 09/12/13   Ethelda Chick, MD  oxyCODONE-acetaminophen (PERCOCET) 5-325 MG per tablet Take 1 tablet by mouth every 4 (four) hours as needed for pain. 05/20/12   Vida Roller, MD   Triage Vitals: BP 101/76 mmHg  Pulse 108  Temp(Src) 98.1 F (36.7 C) (Oral)  Resp 18  Ht 6' (1.829 m)  Wt 173 lb (78.472 kg)  BMI 23.46 kg/m2  SpO2 96%  Physical Exam  Constitutional: He is oriented to person, place, and time. He appears well-developed and well-nourished. No distress.  HENT:  Head: Normocephalic and atraumatic.  Mouth/Throat: Oral lesions present.  Multiple ulcerations on the roof of the mouth, top and bottom anterior gums, and the inferior aspect of the tongue.  Eyes: Conjunctivae and EOM are normal.  Neck: Neck supple. No tracheal deviation present.  Cardiovascular: Normal rate.   Pulmonary/Chest: Effort normal and breath sounds normal. No respiratory distress.  Lungs clear, equally and bilaterally.  Abdominal: Soft. Bowel sounds are normal. There is no hepatosplenomegaly. There is no tenderness.  No hepatosplenomegaly.  Musculoskeletal: Normal range of motion.  Lymphadenopathy:  He has cervical adenopathy.  There is mild anterior cervical lymphadenopathy.  Neurological: He is alert and oriented to person, place, and time.  Skin: Skin is warm and dry.  Psychiatric: He has a normal mood and affect. His behavior is normal.  Nursing note and vitals reviewed.   ED Course  Procedures (including critical care time)  DIAGNOSTIC STUDIES: Oxygen Saturation is 96% on room air, adequate by my interpretation.     COORDINATION OF CARE: At 1732 Discussed treatment plan with patient which includes labs. Patient agrees.   Labs Review Labs Reviewed  RAPID HIV SCREEN ([facility]) - Abnormal; Notable for the following:    SUDS Rapid HIV Screen Reactive (*)    All other components within normal limits  MONONUCLEOSIS SCREEN  HIV ANTIBODY (ROUTINE TESTING)    Imaging Review No results found.   EKG Interpretation None      MDM   Final diagnoses:  HIV (human immunodeficiency virus infection)  Lesion of oral mucosa   Patient here with oral lesions beginning today with description of a possible prodrome yesterday. Patient complaining of oral swelling and tongue swelling, choking on saliva, however there is no obvious oral, tongue or tonsillar obstruction. No obstruction noted in patient's oropharynx. Patient moving air well, and tolerating fluids well. Patient states he has this feeling swelling due to his pain, however is in no acute distress respiratory or otherwise. Patient nontoxic tachycardic, afebrile, nontachypneic, non-hypoxic and in no obvious distress of any kind. Ulcers noted to be glucose, gingiva and tongue. Ulcers are shallow and non-erythematous or vesicular. Concern for possible primary HIV infection, will follow up with rapid HIV and Monospot test. Patient with significant sexual history of for current partners both male and male.   rapid HIV reactive tonight. I informed patient of these results, and strongly encouraged him to follow-up with infectious diseases. I spent approximately 20 minutes in counseling patient on HIV infections and providing education for same. I again strongly encouraged follow-up at either the infectious disease clinic or [facility]. I discussed return precautions with patient, and patient verbalized agreement and understanding of this plan. I recommended Magic mouthwash for patients oral lesions. I encouraged patient to call or return to ER with  any worsening of symptoms or should he have any questions or concerns.  I personally performed the services described in this documentation, which was scribed in my presence. The recorded information has been reviewed and is accurate.  BP 103/71 mmHg  Pulse 95  Temp(Src) 98.1 F (36.7 C) (Oral)  Resp 18  Ht 6' (1.829 m)  Wt 173 lb (78.472 kg)  BMI 23.46 kg/m2  SpO2 98%  Signed,  Ladona MowJoe Janett Kamath, PA-C 2:50 AM  Patient seen and discussed with Dr. Bethann BerkshireJoseph Zammit, MD  Monte FantasiaJoseph W Jessaca Philippi, PA-C 07/09/14 [id number]  Benny LennertJoseph L Zammit, MD 07/11/14 1325

## 2014-07-08 NOTE — ED Notes (Addendum)
Pt asked if the PA could give him something for pain. Pt states that he feels like he is having difficulty breathing when he is sitting up and laying down. Pt states that he is "constantly choking on my saliva now, I feel like my tongue is blocking my airway".

## 2014-07-08 NOTE — Telephone Encounter (Signed)
Patient had rapid HIV test positive from ED.   Cannot discern from notes if he was informed of results  Can one of you clinic MD's clarify what has happened with this gentleman.   Looks like he needs phone call and intake

## 2014-07-08 NOTE — ED Notes (Signed)
Pt states that the white spots on his tongue appeared today and the darker spots on his upper and lower gums appeared about 1 week ago. Pt states the spots, his lips, and throat burn and sting. Pt states that it is painful to talk and swallow.

## 2014-07-08 NOTE — ED Notes (Addendum)
Pt states that he would like his blood to be drawn to make sure that he does not have an infection. Pt states that he doesn't want any stone un-turned and that she "strongly strongly strongly suggests and wants blood work to be done".

## 2014-07-09 ENCOUNTER — Telehealth: Payer: Self-pay | Admitting: Infectious Diseases

## 2014-07-09 NOTE — Telephone Encounter (Signed)
Thanks Jeff.

## 2014-07-09 NOTE — Telephone Encounter (Signed)
Will call him today thanks

## 2014-07-09 NOTE — Telephone Encounter (Signed)
Called both #s listed. Neither are working #s.  Will have DIS track pt.

## 2014-07-12 ENCOUNTER — Encounter (HOSPITAL_COMMUNITY): Payer: Self-pay | Admitting: *Deleted

## 2014-07-12 ENCOUNTER — Emergency Department (HOSPITAL_COMMUNITY)
Admission: EM | Admit: 2014-07-12 | Discharge: 2014-07-12 | Disposition: A | Discharge status: Home / Self Care | Payer: Self-pay | Attending: Emergency Medicine | Admitting: Emergency Medicine

## 2014-07-12 DIAGNOSIS — K122 Cellulitis and abscess of mouth: Secondary | ICD-10-CM | POA: Insufficient documentation

## 2014-07-12 DIAGNOSIS — Z21 Asymptomatic human immunodeficiency virus [HIV] infection status: Secondary | ICD-10-CM | POA: Insufficient documentation

## 2014-07-12 DIAGNOSIS — Z79899 Other long term (current) drug therapy: Secondary | ICD-10-CM | POA: Insufficient documentation

## 2014-07-12 DIAGNOSIS — K644 Residual hemorrhoidal skin tags: Secondary | ICD-10-CM | POA: Insufficient documentation

## 2014-07-12 DIAGNOSIS — K209 Esophagitis, unspecified without bleeding: Secondary | ICD-10-CM

## 2014-07-12 DIAGNOSIS — K121 Other forms of stomatitis: Secondary | ICD-10-CM

## 2014-07-12 HISTORY — DX: Human immunodeficiency virus (HIV) disease: B20

## 2014-07-12 HISTORY — DX: Asymptomatic human immunodeficiency virus (hiv) infection status: Z21

## 2014-07-12 MED ORDER — NAPROXEN 500 MG PO TABS: 500.0000 mg | ORAL_TABLET | Freq: Two times a day (BID) | ORAL | Status: DC

## 2014-07-12 MED ORDER — HYDROCORTISONE 2.5 % RE CREA: TOPICAL_CREAM | RECTAL | Status: DC

## 2014-07-12 MED ORDER — HYDROCODONE-ACETAMINOPHEN 5-325 MG PO TABS
2.0000 | ORAL_TABLET | Freq: Once | ORAL | Status: AC
  Administered 2014-07-12: 2 via ORAL
  Filled 2014-07-12: qty 2

## 2014-07-12 MED ORDER — HYDROCODONE-ACETAMINOPHEN 5-325 MG PO TABS: 2.0000 | ORAL_TABLET | ORAL | Status: DC | PRN

## 2014-07-12 MED ORDER — CEFTRIAXONE SODIUM 250 MG IJ SOLR
250.0000 mg | Freq: Once | INTRAMUSCULAR | Status: AC
  Administered 2014-07-12: 250 mg via INTRAMUSCULAR
  Filled 2014-07-12: qty 250

## 2014-07-12 MED ORDER — PENICILLIN G BENZATHINE 1200000 UNIT/2ML IM SUSP
2.4000 10*6.[IU] | Freq: Once | INTRAMUSCULAR | Status: AC
  Administered 2014-07-12: 2.4 10*6.[IU] via INTRAMUSCULAR
  Filled 2014-07-12: qty 4

## 2014-07-12 MED ORDER — DOCUSATE SODIUM 100 MG PO CAPS: 100.0000 mg | ORAL_CAPSULE | Freq: Two times a day (BID) | ORAL | Status: DC

## 2014-07-12 MED ORDER — FLUCONAZOLE 200 MG PO TABS: 200.0000 mg | ORAL_TABLET | Freq: Every day | ORAL | Status: AC

## 2014-07-12 MED ORDER — GI COCKTAIL ~~LOC~~
30.0000 mL | Freq: Once | ORAL | Status: AC
  Administered 2014-07-12: 30 mL via ORAL
  Filled 2014-07-12: qty 30

## 2014-07-12 MED ORDER — FLUCONAZOLE 100 MG PO TABS
400.0000 mg | ORAL_TABLET | Freq: Once | ORAL | Status: AC
  Administered 2014-07-12: 400 mg via ORAL
  Filled 2014-07-12: qty 4

## 2014-07-12 MED ORDER — LIDOCAINE HCL (PF) 1 % IJ SOLN
0.9000 mL | Freq: Once | INTRAMUSCULAR | Status: AC
  Administered 2014-07-12: 0.9 mL
  Filled 2014-07-12: qty 5

## 2014-07-12 MED ORDER — AZITHROMYCIN 250 MG PO TABS
1000.0000 mg | ORAL_TABLET | Freq: Once | ORAL | Status: AC
  Administered 2014-07-12: 1000 mg via ORAL
  Filled 2014-07-12: qty 4

## 2014-07-12 NOTE — ED Provider Notes (Signed)
CSN: [id number]     Arrival date & time 07/12/14  1450 History   First MD Initiated Contact with Patient 07/12/14 1651     Chief Complaint  Patient presents with  . Rectal Pain     (Consider location/radiation/quality/duration/timing/severity/associated sxs/prior Treatment) HPI Comments: The patient is a 34 year old male with a recent diagnosis of HIV, he reports having mouth sores which were treated at the last visit, he was prescribed Magic mouthwash however this was unable to be filled according to the patient because the pharmacy stated that it needed to be written in a different way. He reports that the sores are slightly worsening, he now has significant pharyngitis and difficulty swallowing but no difficulty breathing. He denies fevers or chills nausea or vomiting or abdominal pain but does state that he is having some increased pain around his anus and at the base of the scrotum. His symptoms are persistent, gradually worsening, associated with a small amount of bleeding on the toilet paper. This week he was contacted by the infectious disease clinic and set up with an appointment, he is getting his insurance this week and will be seeing specialists necessary to treat his HIV. He is unsure when he contracted this illness but states that approximately one year ago he took a home HIV test and it was negative.  The history is provided by the patient.    Past Medical History  Diagnosis Date  . HIV (human immunodeficiency virus infection)    History reviewed. No pertinent past surgical history. History reviewed. No pertinent family history. History  Substance Use Topics  . Smoking status: Never Smoker   . Smokeless tobacco: Not on file  . Alcohol Use: Yes    Review of Systems  All other systems reviewed and are negative.     Allergies  Review of patient's allergies indicates no known allergies.  Home Medications   Prior to Admission medications   Medication Sig Start Date  End Date Taking? Authorizing Provider  Alum & Mag Hydroxide-Simeth (MAGIC MOUTHWASH) SOLN Take 5 mLs by mouth 3 (three) times daily as needed for mouth pain. 07/08/14   Monte Fantasia, PA-C  clindamycin (CLEOCIN) 300 MG capsule Take 1 capsule (300 mg total) by mouth 3 (three) times daily. Patient not taking: Reported on 07/12/2014 09/12/13   Ethelda Chick, MD  docusate sodium (COLACE) 100 MG capsule Take 1 capsule (100 mg total) by mouth every 12 (twelve) hours. 07/12/14   Vida Roller, MD  fluconazole (DIFLUCAN) 200 MG tablet Take 1 tablet (200 mg total) by mouth daily. 07/12/14 07/19/14  Vida Roller, MD  HYDROcodone-acetaminophen (NORCO/VICODIN) 5-325 MG per tablet Take 2 tablets by mouth every 4 (four) hours as needed. 07/12/14   Vida Roller, MD  hydrocortisone (ANUSOL-HC) 2.5 % rectal cream Apply rectally 2 times daily 07/12/14   Vida Roller, MD  ibuprofen (ADVIL,MOTRIN) 800 MG tablet Take 1 tablet (800 mg total) by mouth 3 (three) times daily. Patient not taking: Reported on 07/12/2014 09/12/13   Ethelda Chick, MD  naproxen (NAPROSYN) 500 MG tablet Take 1 tablet (500 mg total) by mouth 2 (two) times daily with a meal. 07/12/14   Vida Roller, MD  oxyCODONE-acetaminophen (PERCOCET) 5-325 MG per tablet Take 1 tablet by mouth every 4 (four) hours as needed for pain. Patient not taking: Reported on 07/12/2014 05/20/12   Vida Roller, MD   BP 126/87 mmHg  Pulse 101  Temp(Src) 98.6 F (37 C) (Oral)  Resp 18  SpO2 99% Physical Exam  Constitutional: He appears well-developed and well-nourished. No distress.  HENT:  Head: Normocephalic and atraumatic.  Mouth/Throat: Oropharynx is clear and moist. No oropharyngeal exudate.  Multiple shallow aphthous ulcers on the inner buccal mucosa of the upper and lower lips and on the surface of the tongue, no pharyngeal ulcers seen, uvula is slightly elongated but not angioedema. Phonation is normal, tolerating secretions  Eyes: Conjunctivae and EOM are  normal. Pupils are equal, round, and reactive to light. Right eye exhibits no discharge. Left eye exhibits no discharge. No scleral icterus.  Neck: Normal range of motion. Neck supple. No JVD present. No thyromegaly present.  Cardiovascular: Normal rate, regular rhythm, normal heart sounds and intact distal pulses.  Exam reveals no gallop and no friction rub.   No murmur heard. Pulmonary/Chest: Effort normal and breath sounds normal. No respiratory distress. He has no wheezes. He has no rales.  Abdominal: Soft. Bowel sounds are normal. He exhibits no distension and no mass. There is no tenderness.  Genitourinary:  There is 1 single nonthrombosed tender hemorrhoid, nonbleeding, surrounding small gray, one to 2 mm in size papular masses surrounding the anus, several shallow ulcerations at the base of the scrotum  Musculoskeletal: Normal range of motion. He exhibits no edema or tenderness.  Lymphadenopathy:    He has no cervical adenopathy.  Neurological: He is alert. Coordination normal.  Skin: Skin is warm and dry. No rash noted. No erythema.  Psychiatric: He has a normal mood and affect. His behavior is normal.  Nursing note and vitals reviewed.   ED Course  Procedures (including critical care time) Labs Review Labs Reviewed  GC/CHLAMYDIA PROBE AMP  RPR  HIV ANTIBODY (ROUTINE TESTING)    Imaging Review No results found.    MDM   Final diagnoses:  External hemorrhoid  Esophagitis  Mouth ulcers    The patient will undergo conservative thyroid treatment with stool softeners and topical cream, he will be referred from the infectious disease clinic as needed for possible anal warts, no emergent indication for treatment of those at this time. Given these ulcerations in both his mouth and on his genitalia he will be treated for syphilis though it does not have a classic appearance there is not another more likely diagnosis at this time. Testing for gonorrhea chlamydia and syphilis is  underway, vital signs unremarkable, the patient does not appear terribly uncomfortable, he will receive a GI cocktail and outpatient treatment for what could be candidal esophagitis.  Medications given as below, hand written prescription for Magic mouthwash given.  Meds given in ED:  Medications  gi cocktail (Maalox,Lidocaine,Donnatal) (not administered)  cefTRIAXone (ROCEPHIN) injection 250 mg (not administered)  azithromycin (ZITHROMAX) tablet 1,000 mg (not administered)  penicillin g benzathine (BICILLIN LA) 1200000 UNIT/2ML injection 2.4 Million Units (not administered)  HYDROcodone-acetaminophen (NORCO/VICODIN) 5-325 MG per tablet 2 tablet (not administered)  fluconazole (DIFLUCAN) tablet 400 mg (not administered)  lidocaine (PF) (XYLOCAINE) 1 % injection 0.9 mL (not administered)    New Prescriptions   DOCUSATE SODIUM (COLACE) 100 MG CAPSULE    Take 1 capsule (100 mg total) by mouth every 12 (twelve) hours.   FLUCONAZOLE (DIFLUCAN) 200 MG TABLET    Take 1 tablet (200 mg total) by mouth daily.   HYDROCODONE-ACETAMINOPHEN (NORCO/VICODIN) 5-325 MG PER TABLET    Take 2 tablets by mouth every 4 (four) hours as needed.   HYDROCORTISONE (ANUSOL-HC) 2.5 % RECTAL CREAM    Apply rectally 2 times  daily   NAPROXEN (NAPROSYN) 500 MG TABLET    Take 1 tablet (500 mg total) by mouth 2 (two) times daily with a meal.      Vida RollerBrian D Audris Speaker, MD 07/12/14 1816

## 2014-07-12 NOTE — Discharge Instructions (Signed)
He may try biotin mouthwashes to help with your sores in your mouth Take Diflucan once a day for 14 days to help with the sore throat and swallowing We have treated U for gonorrhea, chlamydia and syphilis while you were here, your tests will not come back for 1-2 days, you'll be contacted by phone if they are positive. Please follow-up closely with the infectious disease clinic, they can refer you to dermatology for treatment of your anal condition   Pelvic Infection  If you have been diagnosed with a pelvic infection such as a sexually transmitted disease, you will need to be treated with antibiotics. Please take the medicines as prescribed. Some of these tests do not come back for 1-2 days in which case if they turn positive you will receive a phone call to let you know. If you are contacted and do have an infection consistent with a sexually transmitted disease, then you will need to tell any and all sexual partners that you have had in the last 6 months no so that they can be tested and treated as well. If you should develop severe or worsening pain in your abdomen or the pelvis or develop severe fevers,nausea or vomiting that prevent you from taking your medications, return to the emergency department immediately. Otherwise contact your local physician or county health department for a follow up appointment to complete STD testing including HIV and syphilis.  See the list of phone numbers below.  RESOURCE GUIDE  Dental Problems  Patients with Medicaid: [facility]                     [facility] [id number]                                           [phone] Phone:  [phone]                                                  Phone:  [phone]  If unable to pay or uninsured, contact:  [facility] or [facility] to become qualified for the adult dental clinic.  Chronic Pain Problems Contact [facility]   [phone] Patients need to be referred by their primary care doctor.  Insufficient Money for Medicine Contact [employer]:  call "211" or [facility] [phone].  No Primary Care Doctor Call [facility]  [phone] Other agencies that provide inexpensive medical care    [facility]  [phone]    [facility]  [phone]    [facility]  [phone]    [facility]  [phone]    [facility]  [phone]    [facility]  [phone]  [facility]ological [facility]  [phone] [facility]  [phone] [facility]   [phone] (emergency services [phone])  Substance Abuse Resources [facility]  [phone] [facility] [phone] [facility] [phone] [facility] [phone] Residential & Outpatient Substance Abuse Program  [phone]  Abuse/Neglect [facility] Child [facility] Hotline [phone] [facility] Hotline [phone] (After Hours)  Emergency Shelter [facility] [phone](336) [phone]  Maternity Homes Room  at the [place] [phone] [place] [phone]  MRSA Hotline #:   (989) 017-1907    [facility] Dept. [address]      [place] Phone:  [phone]                                   Phone:  [phone]                 Phone:  [place] Phone:  [place] [phone] (After Hours)

## 2014-07-12 NOTE — ED Notes (Signed)
Pt reports recently being diagnosed with HIV+, has sore throat and rectal pain. Reports having rectal lesions and tongue swelling (which he was seen for on 1/5). Airway is intact at triage.

## 2014-07-14 LAB — GC/CHLAMYDIA PROBE AMP
CT Probe RNA: NEGATIVE
GC PROBE AMP APTIMA: NEGATIVE

## 2014-07-16 LAB — HIV 1/2 SUPPLEMENTAL AB TEST
HIV 1 AB: REACTIVE — AB
HIV 2 AB: NONREACTIVE

## 2014-07-16 LAB — HIV ANTIBODY (ROUTINE TESTING W REFLEX)
HIV 1/O/2 Abs-Index Value: >50 — ABNORMAL HIGH (ref <1.00)
HIV-1/HIV-2 Ab: REACTIVE — AB

## 2014-07-16 LAB — RPR, QUANT. (REFLEX)

## 2014-07-16 LAB — RPR: RPR Ser Ql: REACTIVE — AB

## 2014-07-16 NOTE — Telephone Encounter (Signed)
This pt needs apt asap thanks

## 2014-07-17 ENCOUNTER — Telehealth (HOSPITAL_BASED_OUTPATIENT_CLINIC_OR_DEPARTMENT_OTHER): Payer: Self-pay | Admitting: Emergency Medicine

## 2014-07-17 NOTE — Telephone Encounter (Addendum)
Post ED Visit - Positive Culture Follow-up  Culture report reviewed by antimicrobial stewardship pharmacist: []  Wes Dulaney, Pharm.D., BCPS []  Celedonio MiyamotoJeremy Frens, Pharm.D., BCPS []  Georgina PillionElizabeth Martin, Pharm.D., BCPS []  [place] MonroeMinh Pham, [address]Pharm[address]D., BCPS, AAHIVP []  Estella HuskMichelle Turner, Pharm.D., BCPS, AAHIVP []  Elder CyphersLorie Poole, [address]Pharm.D., BCPS  Positive syphyllis culture Treated with rocephin and zithromax, was not treated for syphyllis, chart handoff to EDP   Berle MullMiller, Jimmylee Ratterree 07/17/2014, 12:36 PM

## 2014-07-20 ENCOUNTER — Telehealth: Payer: Self-pay | Admitting: Emergency Medicine

## 2014-07-22 ENCOUNTER — Telehealth (HOSPITAL_BASED_OUTPATIENT_CLINIC_OR_DEPARTMENT_OTHER): Payer: Self-pay | Admitting: Emergency Medicine

## 2014-07-23 ENCOUNTER — Telehealth: Payer: Self-pay

## 2014-07-23 NOTE — Telephone Encounter (Signed)
[phone] is number given by listed number of [phone].  No answer I am attempting to reach patient to schedule office visit for newly diagnosed HIV.  Multiple attempts made to reach patient without success.  I will make Bridge Counselor referral.   Laurell Josephsammy K Pietrina Jagodzinski, RN

## 2014-07-24 LAB — FLUORESCENT TREPONEMAL AB(FTA)-IGG-BLD: Fluorescent Treponemal Ab, IgG: REACTIVE — AB

## 2014-08-05 ENCOUNTER — Telehealth: Payer: Self-pay

## 2014-08-05 NOTE — Telephone Encounter (Signed)
Left appointment information at [phone] at patient's request.   Information also given to Selena BattenKim, Child psychotherapistocial Worker at [facility]   Laurell Josephsammy K Aurea Aronov, RN

## 2014-08-19 ENCOUNTER — Ambulatory Visit: Payer: Self-pay

## 2014-08-19 DIAGNOSIS — B20 Human immunodeficiency virus [HIV] disease: Secondary | ICD-10-CM

## 2014-08-19 DIAGNOSIS — Z8619 Personal history of other infectious and parasitic diseases: Secondary | ICD-10-CM

## 2014-08-19 LAB — COMPLETE METABOLIC PANEL WITH GFR
ALK PHOS: 94 U/L (ref 39–117)
ALT: 10 U/L (ref 0–53)
AST: 17 U/L (ref 0–37)
Albumin: 4.4 g/dL (ref 3.5–5.2)
BILIRUBIN TOTAL: 1.7 mg/dL — AB (ref 0.2–1.2)
BUN: 13 mg/dL (ref 6–23)
CO2: 30 mEq/L (ref 19–32)
Calcium: 9.6 mg/dL (ref 8.4–10.5)
Chloride: 104 mEq/L (ref 96–112)
Creat: 0.99 mg/dL (ref 0.50–1.35)
GFR, Est African American: >89 mL/min
Glucose, Bld: 75 mg/dL (ref 70–99)
POTASSIUM: 4.4 meq/L (ref 3.5–5.3)
SODIUM: 140 meq/L (ref 135–145)
TOTAL PROTEIN: 7.7 g/dL (ref 6.0–8.3)

## 2014-08-19 LAB — CBC WITH DIFFERENTIAL/PLATELET
BASOS PCT: 1 % (ref 0–1)
Basophils Absolute: 0 10*3/uL (ref 0.0–0.1)
EOS ABS: 0.1 10*3/uL (ref 0.0–0.7)
EOS PCT: 2 % (ref 0–5)
HCT: 45.9 % (ref 39.0–52.0)
HEMOGLOBIN: 15.5 g/dL (ref 13.0–17.0)
LYMPHS ABS: 1.8 10*3/uL (ref 0.7–4.0)
Lymphocytes Relative: 42 % (ref 12–46)
MCH: 30.5 pg (ref 26.0–34.0)
MCHC: 33.8 g/dL (ref 30.0–36.0)
MCV: 90.2 fL (ref 78.0–100.0)
MPV: 9.6 fL (ref 8.6–12.4)
Monocytes Absolute: 0.3 10*3/uL (ref 0.1–1.0)
Monocytes Relative: 8 % (ref 3–12)
NEUTROS PCT: 47 % (ref 43–77)
Neutro Abs: 2 10*3/uL (ref 1.7–7.7)
Platelets: 195 10*3/uL (ref 150–400)
RBC: 5.09 MIL/uL (ref 4.22–5.81)
RDW: 13.2 % (ref 11.5–15.5)
WBC: 4.3 10*3/uL (ref 4.0–10.5)

## 2014-08-19 LAB — LIPID PANEL
CHOLESTEROL: 159 mg/dL (ref 0–200)
HDL: 57 mg/dL (ref >39)
LDL Cholesterol: 93 mg/dL (ref 0–99)
TRIGLYCERIDES: 47 mg/dL (ref <150)
Total CHOL/HDL Ratio: 2.8 Ratio
VLDL: 9 mg/dL (ref 0–40)

## 2014-08-20 LAB — URINE CYTOLOGY ANCILLARY ONLY
CHLAMYDIA, DNA PROBE: NEGATIVE
Neisseria Gonorrhea: NEGATIVE

## 2014-08-20 LAB — URINALYSIS
Bilirubin Urine: NEGATIVE
GLUCOSE, UA: NEGATIVE mg/dL
Hgb urine dipstick: NEGATIVE
KETONES UR: NEGATIVE mg/dL
Leukocytes, UA: NEGATIVE
NITRITE: NEGATIVE
Protein, ur: NEGATIVE mg/dL
Specific Gravity, Urine: 1.025 (ref 1.005–1.030)
UROBILINOGEN UA: 0.2 mg/dL (ref 0.0–1.0)
pH: 5 (ref 5.0–8.0)

## 2014-08-20 LAB — HEPATITIS A ANTIBODY, TOTAL: Hep A Total Ab: NONREACTIVE

## 2014-08-20 LAB — HIV-1 RNA ULTRAQUANT REFLEX TO GENTYP+
HIV 1 RNA Quant: 13497 copies/mL — ABNORMAL HIGH (ref <20)
HIV-1 RNA QUANT, LOG: 4.13 {Log} — AB (ref <1.30)

## 2014-08-20 LAB — FLUORESCENT TREPONEMAL AB(FTA)-IGG-BLD: Fluorescent Treponemal ABS: REACTIVE — AB

## 2014-08-20 LAB — HEPATITIS B CORE ANTIBODY, TOTAL: Hep B Core Total Ab: REACTIVE — AB

## 2014-08-20 LAB — HEPATITIS C ANTIBODY: HCV AB: NEGATIVE

## 2014-08-20 LAB — T-HELPER CELL (CD4) - (RCID CLINIC ONLY)
CD4 % Helper T Cell: 22 % — ABNORMAL LOW (ref 33–55)
CD4 T Cell Abs: 420 /uL (ref 400–2700)

## 2014-08-20 LAB — RPR TITER: RPR Titer: 1:32 {titer} — AB

## 2014-08-20 LAB — HEPATITIS B SURFACE ANTIGEN: Hepatitis B Surface Ag: NEGATIVE

## 2014-08-20 LAB — HEPATITIS B SURFACE ANTIBODY,QUALITATIVE: HEP B S AB: POSITIVE — AB

## 2014-08-20 LAB — RPR: RPR: REACTIVE — AB

## 2014-08-20 NOTE — Progress Notes (Signed)
[facility]. He presented with symptoms of swollen neck glands and sore throat for 2 weeks.  He was surprised by results because he thought he was being safe.    5 tattoos all on Right arm performed in shop. Piercings in lip, ear and back of neck removed 3 years ago.  No medical records to request.   Positive Hep B core antibody 07-19-14.  Laurell Josephsammy K King, RN

## 2014-08-23 LAB — HLA B*5701: HLA-B*5701 w/rflx HLA-B High: NEGATIVE

## 2014-08-25 DIAGNOSIS — Z8619 Personal history of other infectious and parasitic diseases: Secondary | ICD-10-CM

## 2014-08-25 HISTORY — DX: Personal history of other infectious and parasitic diseases: Z86.19

## 2014-08-26 LAB — HIV-1 GENOTYPR PLUS

## 2014-09-02 ENCOUNTER — Encounter: Payer: Self-pay | Admitting: Internal Medicine

## 2014-09-02 ENCOUNTER — Ambulatory Visit (INDEPENDENT_AMBULATORY_CARE_PROVIDER_SITE_OTHER): Payer: Self-pay | Admitting: Internal Medicine

## 2014-09-02 VITALS — BP 123/74 | HR 80 | Temp 98.5°F | Ht 72.0 in | Wt 171.0 lb

## 2014-09-02 DIAGNOSIS — Z8619 Personal history of other infectious and parasitic diseases: Secondary | ICD-10-CM

## 2014-09-02 DIAGNOSIS — B20 Human immunodeficiency virus [HIV] disease: Secondary | ICD-10-CM

## 2014-09-02 MED ORDER — ELVITEG-COBIC-EMTRICIT-TENOFAF 150-150-200-10 MG PO TABS: 1.0000 | ORAL_TABLET | Freq: Every day | ORAL | Status: DC

## 2014-09-02 NOTE — Progress Notes (Signed)
HPI: Luiz BlareQuadyr Berringer is a 34 y.o. male who presents to the [facility] today for newly diagnosed HIV.  Allergies: No Known Allergies  Vitals: Temp: 98.5 F (36.9 C) (03/01 1443) Temp Source: Oral (03/01 1443) BP: 123/74 mmHg (03/01 1443) Pulse Rate: 80 (03/01 1443)  Past Medical History: Past Medical History  Diagnosis Date  . HIV (human immunodeficiency virus infection)   . History of syphilis 08/25/2014    Treated by [facility] 07-2014.    Social History: History   Social History  . Marital Status: Single    Spouse Name: N/A  . Number of Children: N/A  . Years of Education: N/A   Social History Main Topics  . Smoking status: Never Smoker   . Smokeless tobacco: Never Used  . Alcohol Use: 1.2 oz/week    2 Standard drinks or equivalent per week     Comment: occasional  . 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 Sexual Activity:    Partners: Male    Birth Control/ Protection: Condom     Comment: given condoms   Other Topics Concern  . None   Social History Narrative    Labs: HIV 1 RNA QUANT (copies/mL)  Date Value  08/19/2014 13497*   CD4 T CELL ABS (/uL)  Date Value  08/19/2014 420   HEP B S AB (no units)  Date Value  08/19/2014 POS*   HEPATITIS B SURFACE AG (no units)  Date Value  08/19/2014 NEGATIVE   HCV AB (no units)  Date Value  08/19/2014 NEGATIVE    CrCl: Estimated Creatinine Clearance: 116.5 mL/min (by C-G formula based on Cr of 0.99).  Lipids:    Component Value Date/Time   CHOL 159 08/19/2014 1151   TRIG 47 08/19/2014 1151   HDL 57 08/19/2014 1151   CHOLHDL 2.8 08/19/2014 1151   VLDL 9 08/19/2014 1151   LDLCALC 93 08/19/2014 1151    Assessment: 34 yo m who presents to the [facility] today for newly diagnosed HIV.  Discussed new antiretroviral therapy with the patient including side effects and importance of compliance.  Educated him on the importance of taking his medication at the  same time everyday and to take it with food. Also educated the patient on lifestyle habits and choices and answered any questions the patient had.  Provided the patient with a keychain pill box.  Recommendations: Genvoya (elvitegravir 150 mg/cobicistat 150 mg/emtricitabine 200 mg/tenofovir alafenamide 10 mg) one tablet once daily  Blayne Garlick L. Roseanne RenoStewart, PharmD Clinical Infectious Disease Pharmacist [facility] 09/02/2014, 3:31 PM

## 2014-09-02 NOTE — Progress Notes (Signed)
Patient ID: Justin [name], male   DOB: 05/24/1981, 34 y.o.   MRN: [id number]  Patient ID: Justin [name], male    DOB: 05/24/1981, 34 y.o.   MRN: [id number]  HPI:   He is here for new patient evaluation of HIV. He was diagnosed during emergency room visit where he presented with some cervical lymphadenopathy and discomfort. He does not remember being tested prior to that. He has a partner that has been stable and they use protection. His partner recently tested negative. His CD4 count is 420 and his viral load is 13,000.  He is otherwise not on any medications area no other medical problems. He is interested in treatment and is ready to start. He was recently also diagnosed with syphilis and [facility] the [facility]. He also has had a history of chlamydia that has been treated.   He has had no recent exposures to HIV outside of his current partner. He thinks he got this years before his partner since he is negative.  Past Medical History  Diagnosis Date  . HIV (human immunodeficiency virus infection)   . History of syphilis 08/25/2014    Treated by [facility] 07-2014.    Prior to Admission medications   Medication Sig Start Date End Date Taking? Authorizing Provider  ibuprofen (ADVIL,MOTRIN) 800 MG tablet Take 1 tablet (800 mg total) by mouth 3 (three) times daily. 09/12/13  Yes Ethelda ChickMartha K Linker, MD  elvitegravir-cobicistat-emtricitabine-tenofovir (GENVOYA) 150-150-200-10 MG TABS tablet Take 1 tablet by mouth daily with breakfast. 09/02/14   Gardiner Barefootobert W Linsie Lupo, MD    No Known Allergies  History  Substance Use Topics  . Smoking status: Never Smoker   . Smokeless tobacco: Never Used  . Alcohol Use: 1.2 oz/week    2 Standard drinks or equivalent per week     Comment: occasional    Family History  Problem Relation Age of Onset  . Adopted: Yes  . Family history unknown: Yes    Review of Systems A comprehensive review of systems was negative.   Filed Vitals:   09/02/14 1443  BP: 123/74   Pulse: 80  Temp: 98.5 F (36.9 C)   in no apparent distress and alert HEENT: anicteric Cor RRR and No murmurs clear Bowel sounds are normal, liver is not enlarged, spleen is not enlarged peripheral pulses normal, no pedal edema, no clubbing or cyanosis negative for - jaundice, spider hemangioma, telangiectasia, palmar erythema, ecchymosis and atrophy GU: no penile lesions, no inguinal lad, no discharge  Lab Results  Component Value Date   HIV1RNAQUANT 13497* 08/19/2014   No components found for: HIV1GENOTYPRPLUS No components found for: THELPERCELL  Assessment: new HIV patient.  I discussed the medications, the mechanisms of resistance and need to take medication daily. I discussed the drug assistance program. I discussed the long-term prognosis. All questions answered.  Plan: 1) he will start Genvoya when available through the drug assistance program 2) he will follow-up after starting his medication to make sure he is doing well  3) he was also counseled by the pharmacist 4) syphilis was treated by the health department

## 2014-09-10 ENCOUNTER — Ambulatory Visit: Payer: Self-pay | Admitting: *Deleted

## 2014-09-10 VITALS — BP 98/67 | HR 97 | Temp 97.8°F | Resp 16 | Ht 72.0 in | Wt 171.5 lb

## 2014-09-10 DIAGNOSIS — Z006 Encounter for examination for normal comparison and control in clinical research program: Secondary | ICD-10-CM

## 2014-09-10 LAB — CBC WITH DIFFERENTIAL/PLATELET
Basophils Absolute: 0 10*3/uL (ref 0.0–0.1)
Basophils Relative: 0 % (ref 0–1)
Eosinophils Absolute: 0.1 10*3/uL (ref 0.0–0.7)
Eosinophils Relative: 2 % (ref 0–5)
HEMATOCRIT: 45.3 % (ref 39.0–52.0)
Hemoglobin: 15.6 g/dL (ref 13.0–17.0)
LYMPHS ABS: 1.4 10*3/uL (ref 0.7–4.0)
Lymphocytes Relative: 34 % (ref 12–46)
MCH: 30.6 pg (ref 26.0–34.0)
MCHC: 34.4 g/dL (ref 30.0–36.0)
MCV: 88.8 fL (ref 78.0–100.0)
MPV: 9.8 fL (ref 8.6–12.4)
Monocytes Absolute: 0.5 10*3/uL (ref 0.1–1.0)
Monocytes Relative: 13 % — ABNORMAL HIGH (ref 3–12)
NEUTROS PCT: 51 % (ref 43–77)
Neutro Abs: 2 10*3/uL (ref 1.7–7.7)
PLATELETS: 182 10*3/uL (ref 150–400)
RBC: 5.1 MIL/uL (ref 4.22–5.81)
RDW: 13.4 % (ref 11.5–15.5)
WBC: 4 10*3/uL (ref 4.0–10.5)

## 2014-09-10 LAB — BILIRUBIN,DIRECT & INDIRECT (FRACTIONATED)
Bilirubin, Direct: 0.4 mg/dL — ABNORMAL HIGH (ref 0.0–0.3)
Indirect Bilirubin: 1.3 mg/dL — ABNORMAL HIGH (ref 0.2–1.2)

## 2014-09-10 LAB — COMPREHENSIVE METABOLIC PANEL
ALK PHOS: 87 U/L (ref 39–117)
ALT: 15 U/L (ref 0–53)
AST: 25 U/L (ref 0–37)
Albumin: 4.3 g/dL (ref 3.5–5.2)
BUN: 14 mg/dL (ref 6–23)
CALCIUM: 10 mg/dL (ref 8.4–10.5)
CO2: 23 meq/L (ref 19–32)
Chloride: 105 mEq/L (ref 96–112)
Creat: 1.14 mg/dL (ref 0.50–1.35)
Glucose, Bld: 63 mg/dL — ABNORMAL LOW (ref 70–99)
Potassium: 4 mEq/L (ref 3.5–5.3)
Sodium: 141 mEq/L (ref 135–145)
Total Bilirubin: 1.7 mg/dL — ABNORMAL HIGH (ref 0.2–1.2)
Total Protein: 7.4 g/dL (ref 6.0–8.3)

## 2014-09-10 NOTE — Progress Notes (Signed)
Paulo FruitQuadyr is here to screen for the A5353 ACTG study, providing treatment for patients new to therapy using 2 agents, Tivicay and Lamivudine. Informed consent was obtained after we reviewed the consent thoroughly with him and answered all his questions. He understands it is completely voluntary and he can withdraw consent at any time. He knows he will be given treatment for HIV for 52 weeks and he is expected to keep all study appts and visits with his ID physician. He says his current partner is HIV negative and that they always use condoms so he is unsure of where he got HIV or for how long he has had it. I explained to him how the viral load and CD4 are indicators of health and he wants to start treatment. He has asked for a dental referral for a broken molar, general cleaning and condoms. He denies any current problems and says he is very healthy other than the HIV.  Entry is planned for March 24th once we get the integrase genotype results back.

## 2014-09-11 LAB — HEPATITIS B SURFACE ANTIGEN: HEP B S AG: NEGATIVE

## 2014-09-25 ENCOUNTER — Ambulatory Visit (INDEPENDENT_AMBULATORY_CARE_PROVIDER_SITE_OTHER): Payer: Self-pay | Admitting: *Deleted

## 2014-09-25 VITALS — BP 106/74 | HR 102 | Temp 98.4°F | Resp 18 | Wt 174.8 lb

## 2014-09-25 DIAGNOSIS — Z006 Encounter for examination for normal comparison and control in clinical research program: Secondary | ICD-10-CM

## 2014-09-25 LAB — POCT URINALYSIS DIPSTICK
BILIRUBIN UA: NEGATIVE
Blood, UA: NEGATIVE
Glucose, UA: NEGATIVE
Ketones, UA: NEGATIVE
LEUKOCYTES UA: NEGATIVE
Nitrite, UA: NEGATIVE
PH UA: 6.5
Protein, UA: NEGATIVE
Spec Grav, UA: 1.025
Urobilinogen, UA: 4

## 2014-09-25 LAB — COMPREHENSIVE METABOLIC PANEL
ALT: 25 U/L (ref 0–53)
AST: 28 U/L (ref 0–37)
Albumin: 4.4 g/dL (ref 3.5–5.2)
Alkaline Phosphatase: 84 U/L (ref 39–117)
BUN: 10 mg/dL (ref 6–23)
CALCIUM: 9.5 mg/dL (ref 8.4–10.5)
CO2: 23 mEq/L (ref 19–32)
Chloride: 100 mEq/L (ref 96–112)
Creat: 1.04 mg/dL (ref 0.50–1.35)
GLUCOSE: 75 mg/dL (ref 70–99)
POTASSIUM: 3.7 meq/L (ref 3.5–5.3)
Sodium: 141 mEq/L (ref 135–145)
TOTAL PROTEIN: 7.5 g/dL (ref 6.0–8.3)
Total Bilirubin: 1.8 mg/dL — ABNORMAL HIGH (ref 0.2–1.2)

## 2014-09-25 LAB — LIPID PANEL
CHOL/HDL RATIO: 2.8 ratio
Cholesterol: 136 mg/dL (ref 0–200)
HDL: 49 mg/dL (ref >=40)
LDL Cholesterol: 74 mg/dL (ref 0–99)
Triglycerides: 64 mg/dL (ref <150)
VLDL: 13 mg/dL (ref 0–40)

## 2014-09-25 LAB — BILIRUBIN, DIRECT: BILIRUBIN DIRECT: 0.4 mg/dL — AB (ref 0.0–0.3)

## 2014-09-25 NOTE — Progress Notes (Signed)
   Subjective:    Patient ID: Justin [name], male    DOB: 11/28/1980, 34 y.o.   MRN: [id number]  HPI    Review of Systems     Objective:   Physical Exam  Constitutional: He is oriented to person, place, and time. He appears well-developed and well-nourished.  HENT:  Head: Normocephalic.  Eyes: Conjunctivae are normal. Pupils are equal, round, and reactive to light.  Neck: Normal range of motion.  Cardiovascular: Normal rate, regular rhythm, normal heart sounds and intact distal pulses.   Pulmonary/Chest: Effort normal and breath sounds normal. No respiratory distress. He has no wheezes.  Abdominal: Soft. Bowel sounds are normal. There is no tenderness.  Musculoskeletal: Normal range of motion. He exhibits no edema.  Neurological: He is alert and oriented to person, place, and time.  Skin: Skin is warm and dry. No rash noted.  Psychiatric: He has a normal mood and affect. His behavior is normal. Judgment and thought content normal.          Assessment & Plan:   Justin [name] is here for A5353 entry visit. Assessment unchanged since last study visit. Vital signs stable and fasting labs drawn with no problems. Study medications were dispensed and we discussed appropriate way to administer with potential side effects. He verbalized understanding and verbalized instructions on how to take medication. I answered questions he had about it. He received $50 gift card for visit. Will call in 1 week to assess adherence and any issues that may arise. Next appt scheduled for April 7. 2016 @ 2pm. Tacey HeapElisha Elgene Coral RN

## 2014-09-26 LAB — HEPATITIS C ANTIBODY: HCV AB: NEGATIVE

## 2014-10-07 ENCOUNTER — Other Ambulatory Visit: Payer: Self-pay | Admitting: *Deleted

## 2014-10-07 ENCOUNTER — Ambulatory Visit (INDEPENDENT_AMBULATORY_CARE_PROVIDER_SITE_OTHER): Payer: Self-pay | Admitting: *Deleted

## 2014-10-07 ENCOUNTER — Encounter: Payer: Self-pay | Admitting: Internal Medicine

## 2014-10-07 ENCOUNTER — Ambulatory Visit (INDEPENDENT_AMBULATORY_CARE_PROVIDER_SITE_OTHER): Payer: Self-pay | Admitting: Internal Medicine

## 2014-10-07 VITALS — BP 117/77 | HR 92 | Temp 98.1°F | Ht 72.0 in | Wt 175.0 lb

## 2014-10-07 VITALS — BP 112/76 | HR 89 | Temp 97.7°F | Resp 16 | Wt 175.5 lb

## 2014-10-07 DIAGNOSIS — B20 Human immunodeficiency virus [HIV] disease: Secondary | ICD-10-CM

## 2014-10-07 DIAGNOSIS — Z21 Asymptomatic human immunodeficiency virus [HIV] infection status: Secondary | ICD-10-CM

## 2014-10-07 DIAGNOSIS — Z006 Encounter for examination for normal comparison and control in clinical research program: Secondary | ICD-10-CM

## 2014-10-07 MED ORDER — DOLUTEGRAVIR SODIUM 50 MG PO TABS: 50.0000 mg | ORAL_TABLET | Freq: Every day | ORAL | Status: DC

## 2014-10-07 MED ORDER — LAMIVUDINE 300 MG PO TABS: 300.0000 mg | ORAL_TABLET | Freq: Every day | ORAL | Status: DC

## 2014-10-07 NOTE — Progress Notes (Signed)
Justin [name] is here for his week 2 study visit. He has noticed having trouble sleeping since he started his meds, it began about 2 days after he actually started. He says it makes him feel wound up and can't fall asleep. He has tried benadryl which has helped with sleep. He denies any other problems with the meds and has missed one dose in the past 2 weeks, He will return in 2 weeks for the next study visit.

## 2014-10-07 NOTE — Assessment & Plan Note (Signed)
He is doing well on the study medication. Continue with the same and I will see him in 6 months for routine follow-up and he will see the study coordinator during the interim.

## 2014-10-07 NOTE — Progress Notes (Signed)
   Subjective:    Patient ID: Justin [name], male    DOB: August 21, 1980, 34 y.o.   MRN: [id number]  HPI He comes in here for follow-up of HIV. I saw him as a new patient last visit and initially was in the start him on Genvoya however he agreed to be an hour study and is taking dolutegravir with Emtriva.  He is tolerating this well. He is having some difficulty with sleep though this has been relieved with Benadryl. Asking about interactions with Benadryl. No issues with the medication otherwise. He is getting labs done by the study coordinator.   Review of Systems  Constitutional: Negative for fever and chills.  Gastrointestinal: Negative for nausea and diarrhea.  Skin: Negative for rash.  Neurological: Negative for dizziness and light-headedness.       Objective:   Physical Exam  Constitutional: He appears well-developed and well-nourished. No distress.  Eyes: No scleral icterus.  Cardiovascular: Normal rate, regular rhythm and normal heart sounds.   No murmur heard. Pulmonary/Chest: Effort normal and breath sounds normal. No respiratory distress.  Skin: No rash noted.          Assessment & Plan:

## 2014-10-21 ENCOUNTER — Ambulatory Visit (INDEPENDENT_AMBULATORY_CARE_PROVIDER_SITE_OTHER): Payer: Self-pay | Admitting: *Deleted

## 2014-10-21 ENCOUNTER — Encounter: Payer: Self-pay | Admitting: *Deleted

## 2014-10-21 VITALS — BP 101/68 | HR 87 | Temp 97.5°F | Resp 17 | Wt 181.0 lb

## 2014-10-21 DIAGNOSIS — Z006 Encounter for examination for normal comparison and control in clinical research program: Secondary | ICD-10-CM

## 2014-10-21 LAB — COMPREHENSIVE METABOLIC PANEL
ALBUMIN: 4.2 g/dL (ref 3.5–5.2)
ALT: 58 U/L — AB (ref 0–53)
AST: 43 U/L — ABNORMAL HIGH (ref 0–37)
Alkaline Phosphatase: 96 U/L (ref 39–117)
BILIRUBIN TOTAL: 1.2 mg/dL (ref 0.2–1.2)
BUN: 11 mg/dL (ref 6–23)
CHLORIDE: 103 meq/L (ref 96–112)
CO2: 27 meq/L (ref 19–32)
Calcium: 9 mg/dL (ref 8.4–10.5)
Creat: 1.01 mg/dL (ref 0.50–1.35)
GLUCOSE: 89 mg/dL (ref 70–99)
POTASSIUM: 3.8 meq/L (ref 3.5–5.3)
Sodium: 140 mEq/L (ref 135–145)
Total Protein: 6.9 g/dL (ref 6.0–8.3)

## 2014-10-21 LAB — BILIRUBIN, DIRECT: BILIRUBIN DIRECT: 0.3 mg/dL (ref 0.0–0.3)

## 2014-10-21 NOTE — Progress Notes (Signed)
Justin [name] is here for A5353 wk4 and [phone] entry. We reviewed [phone] informed consent together. I fully explained the consent, risks, benefits, responsibilities, and answered questions. Participant verbalized understanding and signed the consent witnessed by me. I gave a copy of the signed consent to participant. Assessment unchanged since last study visit. He state he has missed a couple of days of medications. I went over the importance of taking them consistently and the risk of resistance. We reviewed how to take medications. I gave him a key chain pill bottle to help. Vital are normal and non-fasting labs were obtained. He received $50 gift card for [phone] and $10 gift card for [phone]. Will call him in two weeks to assess adherence and will see him back on Nov 04, 2014 @ 2pm. Tacey HeapElisha Nia Nathaniel RN

## 2014-11-06 ENCOUNTER — Encounter: Payer: Self-pay | Admitting: Infectious Disease

## 2014-11-06 LAB — CD4/CD8 (T-HELPER/T-SUPPRESSOR CELL)
CD4%: 22.8
CD4%: 25.1
CD4: 388
CD4: 402
CD8 T CELL SUPPRESSOR: 53.4
CD8 T CELL SUPPRESSOR: 57
CD8: 854
CD8: 969

## 2014-11-06 LAB — HIV-1 RNA QUANT-NO REFLEX-BLD
HIV-1 RNA Viral Load: 9993
HIV-1 RNA Viral Load: <40

## 2014-11-19 ENCOUNTER — Encounter: Payer: Self-pay | Admitting: Infectious Disease

## 2014-11-19 ENCOUNTER — Telehealth: Payer: Self-pay | Admitting: *Deleted

## 2014-11-21 NOTE — Telephone Encounter (Signed)
Justin [name] called about what steps he should take for his care. He recently moved to [place] with a friend. He stated the reason for the move was that it was hard for him to get a job in [place] as well as support. He really wants to stay on study but fears he may not make the appointments and wants to know what options he has. I told him:  He could continue in research if he could keep his appointments. He currently receives his ARVs thru research. He could continue to be seen by Dr. Daiva EvesVan Dam.  If he decided it would be difficult to keep his appointment for research then we would need to take him off study and get him into care in [place] with an ID clinic.   He stated that he would try to come in next week for a research appointment. [name] RN

## 2014-11-26 ENCOUNTER — Encounter (INDEPENDENT_AMBULATORY_CARE_PROVIDER_SITE_OTHER): Payer: Self-pay | Admitting: *Deleted

## 2014-11-26 VITALS — BP 124/77 | HR 76 | Temp 98.3°F | Resp 18 | Wt 184.5 lb

## 2014-11-26 DIAGNOSIS — Z006 Encounter for examination for normal comparison and control in clinical research program: Secondary | ICD-10-CM

## 2014-11-27 NOTE — Progress Notes (Signed)
Paulo FruitQuadyr is here for A5353, week 12. [place] reasons. (Will get address at next visit) His number is the same. C/o a small productive cough that started Sunday as well as a "swollen lymph node". Upon exam he does present with a right palpable sub-mandibular lymph node. All others are non-palpable. Could possibly be from a "cold". I told him to let me know if it got worse and will recheck at next visit. His left elbow and thighs (bilat) have been sore for the past 2 weeks (intermittent). This has not impacted his daily routine. He forgot to bring his pill bottles an leftover pills. He states he has 1 left of each medicine. Study medications were dispensed to him. Blood was not obtained at this visit because we can not process them at this time. His vital signs are stable. He received $100 gift card for visit and travel. Next appointment is scheduled for December 18, 2014 @ 2pm.

## 2014-12-18 ENCOUNTER — Other Ambulatory Visit: Payer: Self-pay | Admitting: *Deleted

## 2014-12-18 ENCOUNTER — Encounter (INDEPENDENT_AMBULATORY_CARE_PROVIDER_SITE_OTHER): Payer: Self-pay | Admitting: *Deleted

## 2014-12-18 VITALS — BP 122/79 | HR 102 | Temp 98.1°F | Resp 18 | Wt 179.5 lb

## 2014-12-18 DIAGNOSIS — Z006 Encounter for examination for normal comparison and control in clinical research program: Secondary | ICD-10-CM

## 2014-12-18 DIAGNOSIS — Z21 Asymptomatic human immunodeficiency virus [HIV] infection status: Secondary | ICD-10-CM

## 2014-12-18 LAB — COMPREHENSIVE METABOLIC PANEL
ALK PHOS: 125 U/L — AB (ref 39–117)
ALT: 364 U/L — ABNORMAL HIGH (ref 0–53)
AST: 119 U/L — ABNORMAL HIGH (ref 0–37)
Albumin: 4.2 g/dL (ref 3.5–5.2)
BILIRUBIN TOTAL: 1.4 mg/dL — AB (ref 0.2–1.2)
BUN: 13 mg/dL (ref 6–23)
CALCIUM: 9.4 mg/dL (ref 8.4–10.5)
CO2: 25 mEq/L (ref 19–32)
CREATININE: 1.16 mg/dL (ref 0.50–1.35)
Chloride: 105 mEq/L (ref 96–112)
GLUCOSE: 144 mg/dL — AB (ref 70–99)
Potassium: 4.3 mEq/L (ref 3.5–5.3)
Sodium: 139 mEq/L (ref 135–145)
Total Protein: 7.3 g/dL (ref 6.0–8.3)

## 2014-12-18 LAB — BILIRUBIN,DIRECT & INDIRECT (FRACTIONATED)
BILIRUBIN INDIRECT: 1 mg/dL (ref 0.2–1.2)
Bilirubin, Direct: 0.4 mg/dL — ABNORMAL HIGH (ref 0.0–0.3)

## 2014-12-18 MED ORDER — DOLUTEGRAVIR SODIUM 50 MG PO TABS: 50.0000 mg | ORAL_TABLET | Freq: Every day | ORAL | Status: DC

## 2014-12-18 MED ORDER — LAMIVUDINE 300 MG PO TABS: 300.0000 mg | ORAL_TABLET | Freq: Every day | ORAL | Status: DC

## 2014-12-18 NOTE — Progress Notes (Signed)
Justin [name] is here for his week 12 study visit. His adherence seems to be good though he says he missed a dose 2 days ago. Pill counts were accurate. He now lives in [place] rides the bus to make his appts. He wishes to come back here for his visits and treatment. He says he was bitten by a tick on the top of his head 2 days ago and is concerned about lyme disease. I told him that it was seen rarely around here and if he developed a rash or flulike illness over the next few weeks then he probably should go see someone about it. He will return in 4 weeks for the next study visit.

## 2014-12-19 ENCOUNTER — Other Ambulatory Visit: Payer: Self-pay | Admitting: *Deleted

## 2014-12-19 DIAGNOSIS — Z21 Asymptomatic human immunodeficiency virus [HIV] infection status: Secondary | ICD-10-CM

## 2014-12-19 MED ORDER — LAMIVUDINE 300 MG PO TABS: 300.0000 mg | ORAL_TABLET | Freq: Every day | ORAL | Status: DC

## 2014-12-25 ENCOUNTER — Telehealth: Payer: Self-pay | Admitting: *Deleted

## 2014-12-25 NOTE — Telephone Encounter (Signed)
Called Justin [name] to check on him. His chemistry results from 6/16 showed elevated liver enzymes, grade 3 ALT (364) and grade 2 AST (119) His ALP was 125 and glucose was 144 non fasting. He denied any symptoms of abd pain, nausea, vomiting, fever, rash etc. He said he felt great. It was his birthday the day before the lab draw and he said he had drunk a 12 pack of beer, which may have caused the elevation in enzymes. He was instructed to call back for any sx suggestive of hepatic problems. He said he would refrain from drinking before his next visit so we could get a clearer assessment of his liver function.

## 2014-12-25 NOTE — Telephone Encounter (Signed)
Thanks Selena Batten that is a good idea for him NOT to drink large quantities of alcohol before coming for labs. Also evidence perhaps etoh not best for this pt in particular!

## 2015-01-07 ENCOUNTER — Encounter: Payer: Self-pay | Admitting: Internal Medicine

## 2015-01-09 ENCOUNTER — Encounter: Payer: Self-pay | Admitting: Infectious Disease

## 2015-01-09 LAB — CD4/CD8 (T-HELPER/T-SUPPRESSOR CELL)
CD4%: 28.9
CD4: 434
CD8 % Suppressor T Cell: 50.3
CD8: 755

## 2015-01-09 LAB — HIV-1 RNA QUANT-NO REFLEX-BLD

## 2015-01-20 ENCOUNTER — Encounter (INDEPENDENT_AMBULATORY_CARE_PROVIDER_SITE_OTHER): Payer: Self-pay | Admitting: *Deleted

## 2015-01-20 VITALS — BP 105/72 | HR 102 | Temp 98.1°F | Resp 16 | Wt 177.5 lb

## 2015-01-20 DIAGNOSIS — Z006 Encounter for examination for normal comparison and control in clinical research program: Secondary | ICD-10-CM

## 2015-01-20 NOTE — Progress Notes (Signed)
Paulo FruitQuadyr is here for his week 16 study visit. He denies any problems, but did mention that he wanted someone to give him a certified letter so he can call his dog a service dog, so he could take him on the bus back to [place]. I told him that we couldn't do that since it is not connected to his HIV and he became a little upset. I did offer him contact with the [facility] in [place] if he needs any counseling. He said he had been working 2 jobs but it was too much so he has given notice for one of the jobs. He is still living in [place] is outside of [place] and travels on the bus to get here and stays overnight in a hotel.

## 2015-02-11 ENCOUNTER — Encounter (INDEPENDENT_AMBULATORY_CARE_PROVIDER_SITE_OTHER): Payer: Self-pay | Admitting: *Deleted

## 2015-02-11 ENCOUNTER — Encounter: Payer: Self-pay | Admitting: *Deleted

## 2015-02-11 VITALS — BP 107/80 | HR 90 | Temp 98.3°F | Resp 17 | Wt 176.0 lb

## 2015-02-11 DIAGNOSIS — Z006 Encounter for examination for normal comparison and control in clinical research program: Secondary | ICD-10-CM

## 2015-02-11 NOTE — Progress Notes (Signed)
Justin [name] is here for A5353, week 20. He was seen at [facility] for a voluntary eval and that lasted 1.5 days. He was reluctant to sign a release of information. He states that dealing with moving and his status - his mood is more depressed and he felt he needed to talk with someone. He is persistent on getting a license to make his dog certified and someone has told him that any doctor can do this. He wants to be able to bring his dog with him on public buses. I went online to research this and found that he would first need to be evaluated by a psychologist or psychiatrist and they would have to write a letter that talks about his emotional instability and how his dog helps him to cope with this. Then he would need to get an MD to write a letter agreeing with the findings. He would then need to certify/license the dog. I would think the dog would need to be evaluated as well. Website: [employer].co.za   Non fasting labs were drawn and vitals stable. He forgot to bring his medication. Study meds were dispensed and he received $150 for visit and travel. Next appt is for 9/7. Tacey Heap RN

## 2015-03-04 ENCOUNTER — Encounter: Payer: Self-pay | Admitting: Infectious Disease

## 2015-03-04 LAB — HIV-1 RNA QUANT-NO REFLEX-BLD

## 2015-03-10 ENCOUNTER — Encounter: Payer: Self-pay | Admitting: *Deleted

## 2015-03-10 VITALS — BP 100/62 | HR 103 | Temp 98.2°F | Resp 16

## 2015-03-10 DIAGNOSIS — Z006 Encounter for examination for normal comparison and control in clinical research program: Secondary | ICD-10-CM

## 2015-03-10 LAB — COMPREHENSIVE METABOLIC PANEL
ALBUMIN: 4 g/dL (ref 3.6–5.1)
ALT: 36 U/L (ref 9–46)
AST: 23 U/L (ref 10–40)
Alkaline Phosphatase: 73 U/L (ref 40–115)
BILIRUBIN TOTAL: 1.6 mg/dL — AB (ref 0.2–1.2)
BUN: 11 mg/dL (ref 7–25)
CO2: 27 mmol/L (ref 20–31)
CREATININE: 1.05 mg/dL (ref 0.60–1.35)
Calcium: 9.5 mg/dL (ref 8.6–10.3)
Chloride: 104 mmol/L (ref 98–110)
Glucose, Bld: 67 mg/dL (ref 65–99)
Potassium: 3.6 mmol/L (ref 3.5–5.3)
SODIUM: 138 mmol/L (ref 135–146)
TOTAL PROTEIN: 6.9 g/dL (ref 6.1–8.1)

## 2015-03-10 LAB — CD4/CD8 (T-HELPER/T-SUPPRESSOR CELL)
CD4 T CELL HELPER: 28.7
CD4: 603
CD8 % Suppressor T Cell: 48.7
CD8: 1023

## 2015-03-10 LAB — HIV-1 RNA QUANT-NO REFLEX-BLD: HIV-1 RNA Viral Load: <40

## 2015-03-10 NOTE — Progress Notes (Signed)
Justin [name] showed up unexpectedly at lunchtime and wanted to do his week 24 visit. He says he had come from out of town and had someone waiting on him. He has lost his phone and the only way to contact him is through his sister. [phone]. He says his life has been out of control lately and he plans to move back to [place] in 2 weeks. He says he has been depressed but refusing to stay and talk with the counselor. He did have someone waiting on him outside and telling him to hurry up.[name] He says he has a resource at the [employer], (Kim Hertzing?") who has been helping him. 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 We discussed the need to get in care with a regular counselor, but he is acting very evasive. I have scheduled an appointment with Dr. Daiva Eves in 3 weeks and he promises me he will keep it.I asked him if he thought his HIV meds were making him depressed and he said no, and admits that the drugs are making him "crash". His next research visit is in 2 months.

## 2015-03-12 LAB — BILIRUBIN,DIRECT & INDIRECT (FRACTIONATED)
BILIRUBIN DIRECT: 0.4 mg/dL — AB (ref <=0.2)
BILIRUBIN INDIRECT: 1.2 mg/dL (ref 0.2–1.2)

## 2015-03-12 LAB — BILIRUBIN, TOTAL: Total Bilirubin: 1.6 mg/dL — ABNORMAL HIGH (ref 0.2–1.2)

## 2015-03-31 ENCOUNTER — Ambulatory Visit: Payer: Self-pay | Admitting: Infectious Disease

## 2015-05-04 ENCOUNTER — Encounter (INDEPENDENT_AMBULATORY_CARE_PROVIDER_SITE_OTHER): Payer: Self-pay | Admitting: *Deleted

## 2015-05-04 VITALS — BP 143/83 | HR 100 | Temp 98.2°F | Wt 179.5 lb

## 2015-05-04 DIAGNOSIS — Z006 Encounter for examination for normal comparison and control in clinical research program: Secondary | ICD-10-CM

## 2015-05-04 LAB — CBC WITH DIFFERENTIAL/PLATELET
BASOS PCT: 0 % (ref 0–1)
Basophils Absolute: 0 10*3/uL (ref 0.0–0.1)
EOS ABS: 0 10*3/uL (ref 0.0–0.7)
EOS PCT: 0 % (ref 0–5)
HCT: 44.6 % (ref 39.0–52.0)
Hemoglobin: 15.9 g/dL (ref 13.0–17.0)
LYMPHS ABS: 1.8 10*3/uL (ref 0.7–4.0)
Lymphocytes Relative: 25 % (ref 12–46)
MCH: 33.1 pg (ref 26.0–34.0)
MCHC: 35.7 g/dL (ref 30.0–36.0)
MCV: 92.9 fL (ref 78.0–100.0)
MPV: 9.5 fL (ref 8.6–12.4)
Monocytes Absolute: 0.6 10*3/uL (ref 0.1–1.0)
Monocytes Relative: 9 % (ref 3–12)
Neutro Abs: 4.8 10*3/uL (ref 1.7–7.7)
Neutrophils Relative %: 66 % (ref 43–77)
Platelets: 203 10*3/uL (ref 150–400)
RBC: 4.8 MIL/uL (ref 4.22–5.81)
RDW: 12.5 % (ref 11.5–15.5)
WBC: 7.2 10*3/uL (ref 4.0–10.5)

## 2015-05-04 LAB — COMPREHENSIVE METABOLIC PANEL
ALBUMIN: 4.6 g/dL (ref 3.6–5.1)
ALK PHOS: 85 U/L (ref 40–115)
ALT: 118 U/L — AB (ref 9–46)
AST: 37 U/L (ref 10–40)
BUN: 12 mg/dL (ref 7–25)
CALCIUM: 9.9 mg/dL (ref 8.6–10.3)
CO2: 24 mmol/L (ref 20–31)
Chloride: 103 mmol/L (ref 98–110)
Creat: 0.98 mg/dL (ref 0.60–1.35)
Glucose, Bld: 81 mg/dL (ref 65–99)
POTASSIUM: 4.1 mmol/L (ref 3.5–5.3)
Sodium: 139 mmol/L (ref 135–146)
TOTAL PROTEIN: 7.7 g/dL (ref 6.1–8.1)
Total Bilirubin: 1.5 mg/dL — ABNORMAL HIGH (ref 0.2–1.2)

## 2015-05-04 LAB — HIV-1 RNA QUANT-NO REFLEX-BLD

## 2015-05-04 LAB — PHOSPHORUS: PHOSPHORUS: 3 mg/dL (ref 2.5–4.5)

## 2015-05-04 LAB — BILIRUBIN, DIRECT: BILIRUBIN DIRECT: 0.3 mg/dL — AB (ref <=0.2)

## 2015-05-04 NOTE — Progress Notes (Signed)
Here for A5353 wk 32 visit. Continues to complain of some anxiety but refuses counseling at this time. [place] and ADAP has expired. Met with [facility] today to renew. Next study visit in December. Will need doctor visit some time after next study visit.

## 2015-05-04 NOTE — Progress Notes (Signed)
[facility] notified via fax. Adeliz Tonkinson M, RN   

## 2015-05-20 ENCOUNTER — Encounter: Payer: Self-pay | Admitting: Internal Medicine

## 2015-06-27 ENCOUNTER — Emergency Department (HOSPITAL_COMMUNITY)
Admission: EM | Admit: 2015-06-27 | Discharge: 2015-06-28 | Disposition: A | Discharge status: Home / Self Care | Payer: Self-pay | Attending: Emergency Medicine | Admitting: Emergency Medicine

## 2015-06-27 ENCOUNTER — Emergency Department (HOSPITAL_COMMUNITY): Payer: Self-pay

## 2015-06-27 ENCOUNTER — Encounter (HOSPITAL_COMMUNITY): Payer: Self-pay | Admitting: *Deleted

## 2015-06-27 DIAGNOSIS — Z791 Long term (current) use of non-steroidal anti-inflammatories (NSAID): Secondary | ICD-10-CM | POA: Insufficient documentation

## 2015-06-27 DIAGNOSIS — M25531 Pain in right wrist: Secondary | ICD-10-CM

## 2015-06-27 DIAGNOSIS — Z21 Asymptomatic human immunodeficiency virus [HIV] infection status: Secondary | ICD-10-CM | POA: Insufficient documentation

## 2015-06-27 DIAGNOSIS — Y998 Other external cause status: Secondary | ICD-10-CM | POA: Insufficient documentation

## 2015-06-27 DIAGNOSIS — Z79899 Other long term (current) drug therapy: Secondary | ICD-10-CM | POA: Insufficient documentation

## 2015-06-27 DIAGNOSIS — S52612A Displaced fracture of left ulna styloid process, initial encounter for closed fracture: Secondary | ICD-10-CM | POA: Insufficient documentation

## 2015-06-27 DIAGNOSIS — S4991XA Unspecified injury of right shoulder and upper arm, initial encounter: Secondary | ICD-10-CM | POA: Insufficient documentation

## 2015-06-27 DIAGNOSIS — Y92410 Unspecified street and highway as the place of occurrence of the external cause: Secondary | ICD-10-CM | POA: Insufficient documentation

## 2015-06-27 DIAGNOSIS — S80211A Abrasion, right knee, initial encounter: Secondary | ICD-10-CM | POA: Insufficient documentation

## 2015-06-27 DIAGNOSIS — S50311A Abrasion of right elbow, initial encounter: Secondary | ICD-10-CM | POA: Insufficient documentation

## 2015-06-27 DIAGNOSIS — Z8619 Personal history of other infectious and parasitic diseases: Secondary | ICD-10-CM | POA: Insufficient documentation

## 2015-06-27 DIAGNOSIS — S29001A Unspecified injury of muscle and tendon of front wall of thorax, initial encounter: Secondary | ICD-10-CM | POA: Insufficient documentation

## 2015-06-27 DIAGNOSIS — Y9389 Activity, other specified: Secondary | ICD-10-CM | POA: Insufficient documentation

## 2015-06-27 LAB — CBC
HEMATOCRIT: 42.9 % (ref 39.0–52.0)
Hemoglobin: 14.6 g/dL (ref 13.0–17.0)
MCH: 33 pg (ref 26.0–34.0)
MCHC: 34 g/dL (ref 30.0–36.0)
MCV: 96.8 fL (ref 78.0–100.0)
PLATELETS: 207 10*3/uL (ref 150–400)
RBC: 4.43 MIL/uL (ref 4.22–5.81)
RDW: 12.9 % (ref 11.5–15.5)
WBC: 5.2 10*3/uL (ref 4.0–10.5)

## 2015-06-27 LAB — PROTIME-INR
INR: 0.96 (ref 0.00–1.49)
Prothrombin Time: 13 seconds (ref 11.6–15.2)
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
## 2015-06-27 NOTE — ED Notes (Signed)
Pt stated "I've been drinking.  I am HIV positive."  Pt denies hitting head or LOC.
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
## 2015-06-28 LAB — ETHANOL: Alcohol, Ethyl (B): 75 mg/dL — ABNORMAL HIGH (ref <5)

## 2015-06-28 LAB — COMPREHENSIVE METABOLIC PANEL
ALK PHOS: 84 U/L (ref 38–126)
ALT: 111 U/L — AB (ref 17–63)
AST: 69 U/L — ABNORMAL HIGH (ref 15–41)
Albumin: 4.2 g/dL (ref 3.5–5.0)
Anion gap: 8 (ref 5–15)
BILIRUBIN TOTAL: 1.2 mg/dL (ref 0.3–1.2)
BUN: 7 mg/dL (ref 6–20)
CALCIUM: 9.2 mg/dL (ref 8.9–10.3)
CO2: 26 mmol/L (ref 22–32)
CREATININE: 1.11 mg/dL (ref 0.61–1.24)
Chloride: 109 mmol/L (ref 101–111)
GFR calc non Af Amer: >60 mL/min (ref >60)
GLUCOSE: 87 mg/dL (ref 65–99)
Potassium: 3.7 mmol/L (ref 3.5–5.1)
SODIUM: 143 mmol/L (ref 135–145)
TOTAL PROTEIN: 7.3 g/dL (ref 6.5–8.1)

## 2015-06-28 MED ORDER — HYDROCODONE-ACETAMINOPHEN 5-325 MG PO TABS
2.0000 | ORAL_TABLET | Freq: Once | ORAL | Status: AC
  Administered 2015-06-28: 2 via ORAL
  Filled 2015-06-28: qty 2

## 2015-06-28 MED ORDER — HYDROCODONE-ACETAMINOPHEN 5-325 MG PO TABS: 1.0000 | ORAL_TABLET | Freq: Two times a day (BID) | ORAL | Status: DC | PRN

## 2015-06-28 MED ORDER — HYDROMORPHONE HCL 1 MG/ML IJ SOLN
1.0000 mg | Freq: Once | INTRAMUSCULAR | Status: AC
  Administered 2015-06-28: 1 mg via INTRAMUSCULAR
  Filled 2015-06-28: qty 1

## 2015-06-28 MED ORDER — BACITRACIN ZINC 500 UNIT/GM EX OINT
TOPICAL_OINTMENT | Freq: Once | CUTANEOUS | Status: AC
  Administered 2015-06-28: 1 via TOPICAL
  Filled 2015-06-28: qty 0.9

## 2015-06-28 NOTE — Discharge Instructions (Signed)
Cast or Splint Care Justin [name], take Tylenol as needed for your pain. If your pain becomes severe take Norco. See an orthopedic surgery within the next 3 days for close follow-up. If symptoms worsen or if your cast feels too tight come back to emergency department immediately. Thank you. Casts and splints support injured limbs and keep bones from moving while they heal.  HOME CARE  Keep the cast or splint uncovered during the drying period.  A plaster cast can take 24 to 48 hours to dry.  A fiberglass cast will dry in less than 1 hour.  Do not rest the cast on anything harder than a pillow for 24 hours.  Do not put weight on your injured limb. Do not put pressure on the cast. Wait for your doctor's approval.  Keep the cast or splint dry.  Cover the cast or splint with a plastic bag during baths or wet weather.  If you have a cast over your chest and belly (trunk), take sponge baths until the cast is taken off.  If your cast gets wet, dry it with a towel or blow dryer. Use the cool setting on the blow dryer.  Keep your cast or splint clean. Wash a dirty cast with a damp cloth.  Do not put any objects under your cast or splint.  Do not scratch the skin under the cast with an object. If itching is a problem, use a blow dryer on a cool setting over the itchy area.  Do not trim or cut your cast.  Do not take out the padding from inside your cast.  Exercise your joints near the cast as told by your doctor.  Raise (elevate) your injured limb on 1 or 2 pillows for the first 1 to 3 days. GET HELP IF:  Your cast or splint cracks.  Your cast or splint is too tight or too loose.  You itch badly under the cast.  Your cast gets wet or has a soft spot.  You have a bad smell coming from the cast.  You get an object stuck under the cast.  Your skin around the cast becomes red or sore.  You have new or more pain after the cast is put on. GET HELP RIGHT AWAY IF:  You have fluid  leaking through the cast.  You cannot move your fingers or toes.  Your fingers or toes turn blue or white or are cool, painful, or puffy (swollen).  You have tingling or lose feeling (numbness) around the injured area.  You have bad pain or pressure under the cast.  You have trouble breathing or have shortness of breath.  You have chest pain.   This information is not intended to replace advice given to you by your health care provider. Make sure you discuss any questions you have with your health care provider.   Document Released: 10/20/2010 Document Revised: 02/20/2013 Document Reviewed: 12/27/2012 Elsevier Interactive Patient Education 2016 [employer] Inc[employer] Ulnar Fracture An ulnar fracture is a break in the ulna bone, which is the forearm bone that is located on the same side as your little finger. Your forearm is the part of your arm that is between your elbow and your wrist. It is made up of two bones: the radius and ulna. The ulna forms the point of your elbow at its upper end. The lower end can be felt on the outside of your wrist. An ulnar fracture can happen near the wrist or elbow or in  the middle of your forearm. Middle forearm fractures usually break both the radius and the ulna. CAUSES A heavy, direct blow to the forearm is the most common cause of an ulnar fracture. It takes a lot of force to break a bone in your forearm. This type of injury may be caused by:  An accident, such as a car or bike accident.  Falling with your arm outstretched. RISK FACTORS You may be at greater risk for an ulnar fracture if you:  Play contact sports.  Have a condition that causes your bones to be weak or thin (osteoporosis). SIGNS AND SYMPTOMS  An ulnar fracture causes pain immediately after the injury. You may need to support your forearm with your other hand. Other signs and symptoms include:  An abnormal bend or bump in your arm (deformity).  Swelling.  Bruising.  Numbness or  weakness in your hand.  Inability to turn your hand from side to side (rotate). DIAGNOSIS Your health care provider may diagnose an ulnar fracture based on:  Your symptoms.  Your medical history, including any recent injury.  A physical exam. Your health care provider will look for any deformity and feel for tenderness over the break. Your health care provider will also check whether the bone is out of place.  An X-ray exam to confirm the diagnosis and learn more about the type of fracture. TREATMENT The goals of treatment are to get the bone in proper position for healing and to keep it from moving so it will heal over time. Your treatment will depend on many factors, especially the type of fracture that you have.  If the fractured bone:  Is in the correct position (nondisplaced), you may only need to wear a cast or a splint.  Has a slightly displaced fracture, you may need to have the bones moved back into place manually (closed reduction) before the splint or cast is put on.  You may have a temporary splint before you have a plaster cast. The splint allows room for some swelling. After a few days, a cast can replace the splint.  You may have to wear the cast for about 6 weeks or as directed by your health care provider.  The cast may be changed after about 3 weeks or as directed by your health care provider.  After your cast is taken off, you may need physical therapy to regain full movement in your wrist or elbow.  You may need emergency surgery if you have:  A fractured bone that is out of position (displaced).  A fracture with multiple fragments (comminuted fracture).  A fracture that breaks the skin (open fracture). This type of fracture may require surgical wires, plates, or screws to hold the bone in place.  You may have X-rays every couple of weeks to check on your healing. HOME CARE INSTRUCTIONS  Keep the injured arm above the level of your heart while you are  sitting or lying down. This helps to reduce swelling and pain.  Apply ice to the injured area:  Put ice in a plastic bag.  Place a towel between your skin and the bag.  Leave the ice on for 20 minutes, 2-3 times per day.  Move your fingers often to avoid stiffness and to minimize swelling.  If you have a plaster or fiberglass cast:  Do not try to scratch the skin under the cast using sharp or pointed objects.  Check the skin around the cast every day. You may put  lotion on any red or sore areas.  Keep your cast dry and clean.  If you have a plaster splint:  Wear the splint as directed.  Loosen the elastic around the splint if your fingers become numb and tingle, or if they turn cold and blue.  Do not put pressure on any part of your cast until it is fully hardened. Rest your cast only on a pillow for the first 24 hours.  Protect your cast or splint while bathing or showering, as directed by your health care provider. Do not put your cast or splint into water.  Take medicines only as directed by your health care provider.  Return to activities, such as sports, as directed by your health care provider. Ask your health care provider what activities are safe for you.  Keep all follow-up visits as directed by your health care provider. This is important. SEEK MEDICAL CARE IF:  Your pain medicine is not helping.  Your cast gets damaged or it breaks.  Your cast becomes loose.  Your cast gets wet.  You have more severe pain or swelling than you did before the cast.  You have severe pain when stretching your fingers.  You continue to have pain or stiffness in your elbow or your wrist after your cast is taken off. SEEK IMMEDIATE MEDICAL CARE IF:  You cannot move your fingers.  You lose feeling in your fingers or your hand.  Your hand or your fingers turn cold and pale or blue.  You notice a bad smell coming from your cast.  You have drainage from underneath your  cast.  You have new stains from blood or drainage seeping through your cast.   This information is not intended to replace advice given to you by your health care provider. Make sure you discuss any questions you have with your health care provider.   Document Released: 12/01/2005 Document Revised: 07/11/2014 Document Reviewed: 11/27/2013 Elsevier Interactive Patient Education [employer] Elsevier Inc[employer]

## 2015-06-28 NOTE — ED Notes (Signed)
Bed: WA15 Expected date:  Expected time:  Means of arrival:  Comments: HOLD 
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
## 2015-06-28 NOTE — ED Notes (Signed)
Pt asked about the wait time and his results. Explained to pt that his results were in process. Pt asked for arm band to be taken off because he was just in pain and wanted to go home. Explained to pt that if he left it would be AMA. Armband removed and pt left AMA. RN Sharrie RothmanKim K aware

## 2015-07-01 ENCOUNTER — Encounter (INDEPENDENT_AMBULATORY_CARE_PROVIDER_SITE_OTHER): Payer: Self-pay | Admitting: *Deleted

## 2015-07-01 VITALS — BP 111/76 | HR 99 | Temp 98.3°F | Resp 16 | Wt 175.5 lb

## 2015-07-01 DIAGNOSIS — Z006 Encounter for examination for normal comparison and control in clinical research program: Secondary | ICD-10-CM

## 2015-07-01 LAB — CBC WITH DIFFERENTIAL/PLATELET
Basophils Absolute: 0 10*3/uL (ref 0.0–0.1)
Basophils Relative: 0 % (ref 0–1)
EOS ABS: 0 10*3/uL (ref 0.0–0.7)
EOS PCT: 1 % (ref 0–5)
HEMATOCRIT: 47.6 % (ref 39.0–52.0)
Hemoglobin: 16.9 g/dL (ref 13.0–17.0)
LYMPHS ABS: 1.3 10*3/uL (ref 0.7–4.0)
LYMPHS PCT: 36 % (ref 12–46)
MCH: 33.4 pg (ref 26.0–34.0)
MCHC: 35.5 g/dL (ref 30.0–36.0)
MCV: 94.1 fL (ref 78.0–100.0)
MONO ABS: 0.4 10*3/uL (ref 0.1–1.0)
MONOS PCT: 11 % (ref 3–12)
MPV: 9.6 fL (ref 8.6–12.4)
Neutro Abs: 1.8 10*3/uL (ref 1.7–7.7)
Neutrophils Relative %: 52 % (ref 43–77)
PLATELETS: 218 10*3/uL (ref 150–400)
RBC: 5.06 MIL/uL (ref 4.22–5.81)
RDW: 13.3 % (ref 11.5–15.5)
WBC: 3.5 10*3/uL — ABNORMAL LOW (ref 4.0–10.5)

## 2015-07-01 LAB — COMPREHENSIVE METABOLIC PANEL
ALBUMIN: 4.6 g/dL (ref 3.6–5.1)
ALT: 89 U/L — ABNORMAL HIGH (ref 9–46)
AST: 53 U/L — AB (ref 10–40)
Alkaline Phosphatase: 78 U/L (ref 40–115)
BILIRUBIN TOTAL: 1.5 mg/dL — AB (ref 0.2–1.2)
BUN: 9 mg/dL (ref 7–25)
CHLORIDE: 102 mmol/L (ref 98–110)
CO2: 26 mmol/L (ref 20–31)
CREATININE: 1.12 mg/dL (ref 0.60–1.35)
Calcium: 10.1 mg/dL (ref 8.6–10.3)
Glucose, Bld: 67 mg/dL (ref 65–99)
Potassium: 4.3 mmol/L (ref 3.5–5.3)
SODIUM: 142 mmol/L (ref 135–146)
Total Protein: 7.9 g/dL (ref 6.1–8.1)

## 2015-07-01 LAB — PHOSPHORUS: Phosphorus: 2.8 mg/dL (ref 2.5–4.5)

## 2015-07-01 LAB — BILIRUBIN, DIRECT: Bilirubin, Direct: 0.3 mg/dL — ABNORMAL HIGH (ref <=0.2)
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
## 2015-07-24 ENCOUNTER — Encounter: Payer: Self-pay | Admitting: Internal Medicine

## 2015-07-24 LAB — HIV-1 RNA QUANT-NO REFLEX-BLD

## 2015-08-04 ENCOUNTER — Encounter: Payer: Self-pay | Admitting: Internal Medicine

## 2015-09-08 ENCOUNTER — Ambulatory Visit: Payer: Self-pay | Admitting: Internal Medicine

## 2015-09-18 ENCOUNTER — Telehealth: Payer: Self-pay | Admitting: *Deleted

## 2015-09-18 NOTE — Telephone Encounter (Signed)
Justin [name] called and wanted to know why we hadn't called him about his appointments. He had already missed his week 48 A5353 study visit and his ADAP/ryan white appts. I told him we had been trying to call him for the past 6 weeks and we are now going to have to take him off study. He claims that he gave me his new phone number and that I dropped the ball. He became very belligerent, was slurring his words and I told him I would not speak to him while he acted that way. He did not give us any new phone numbers for his contact and his sister's number had been disconnected. I told him he would need to call and rescheduled appointments for the MD and financial counseling and that he was officially off study.

## 2015-09-21 ENCOUNTER — Telehealth: Payer: Self-pay | Admitting: *Deleted

## 2015-09-21 NOTE — Telephone Encounter (Signed)
Paulo FruitQuadyr called over the weekend and left a message on my line. He spoke of almost being out of medicine and "thank god he had a stash left" because he knew "this would happen". He wanted me to call back and tell him whats up. I called him this morning at the number he left ([phone]) and left a message reminding him that he spoke with Selena BattenKim

## 2015-09-22 ENCOUNTER — Ambulatory Visit: Payer: Self-pay

## 2015-09-23 NOTE — Telephone Encounter (Signed)
Paulo FruitQuadyr called over the weekend and left a message on my line. He spoke of almost being out of medicine and "thank god he had a stash left" because he knew "this would happen". He wanted me to call back and tell him whats up. I called him this morning at the number he left ([phone]) and left a message reminding him that he spoke with Selena BattenKim and that he has an appointment with a financial counselor here to get him set up for ART Medications.

## 2015-10-20 ENCOUNTER — Emergency Department (HOSPITAL_COMMUNITY)
Admission: EM | Admit: 2015-10-20 | Discharge: 2015-10-20 | Disposition: A | Discharge status: Home / Self Care | Payer: Worker's Compensation | Attending: Emergency Medicine | Admitting: Emergency Medicine

## 2015-10-20 ENCOUNTER — Encounter (HOSPITAL_COMMUNITY): Payer: Self-pay | Admitting: *Deleted

## 2015-10-20 ENCOUNTER — Emergency Department (HOSPITAL_COMMUNITY): Payer: Worker's Compensation

## 2015-10-20 DIAGNOSIS — Y990 Civilian activity done for income or pay: Secondary | ICD-10-CM | POA: Diagnosis not present

## 2015-10-20 DIAGNOSIS — W231XXA Caught, crushed, jammed, or pinched between stationary objects, initial encounter: Secondary | ICD-10-CM | POA: Diagnosis not present

## 2015-10-20 DIAGNOSIS — Z79899 Other long term (current) drug therapy: Secondary | ICD-10-CM | POA: Diagnosis not present

## 2015-10-20 DIAGNOSIS — S62512B Displaced fracture of proximal phalanx of left thumb, initial encounter for open fracture: Secondary | ICD-10-CM | POA: Diagnosis not present

## 2015-10-20 DIAGNOSIS — S61012A Laceration without foreign body of left thumb without damage to nail, initial encounter: Secondary | ICD-10-CM | POA: Insufficient documentation

## 2015-10-20 DIAGNOSIS — Z791 Long term (current) use of non-steroidal anti-inflammatories (NSAID): Secondary | ICD-10-CM | POA: Insufficient documentation

## 2015-10-20 DIAGNOSIS — Y9389 Activity, other specified: Secondary | ICD-10-CM | POA: Insufficient documentation

## 2015-10-20 DIAGNOSIS — Y9289 Other specified places as the place of occurrence of the external cause: Secondary | ICD-10-CM | POA: Insufficient documentation

## 2015-10-20 DIAGNOSIS — B20 Human immunodeficiency virus [HIV] disease: Secondary | ICD-10-CM | POA: Diagnosis not present

## 2015-10-20 DIAGNOSIS — S62502B Fracture of unspecified phalanx of left thumb, initial encounter for open fracture: Secondary | ICD-10-CM

## 2015-10-20 DIAGNOSIS — IMO0002 Reserved for concepts with insufficient information to code with codable children: Secondary | ICD-10-CM

## 2015-10-20 DIAGNOSIS — S6991XA Unspecified injury of right wrist, hand and finger(s), initial encounter: Secondary | ICD-10-CM | POA: Diagnosis present

## 2015-10-20 MED ORDER — LIDOCAINE HCL (PF) 1 % IJ SOLN
5.0000 mL | Freq: Once | INTRAMUSCULAR | Status: AC
  Administered 2015-10-20: 5 mL via INTRADERMAL
  Filled 2015-10-20: qty 5

## 2015-10-20 MED ORDER — HYDROCODONE-ACETAMINOPHEN 5-325 MG PO TABS
1.0000 | ORAL_TABLET | Freq: Once | ORAL | Status: AC
  Administered 2015-10-20: 1 via ORAL
  Filled 2015-10-20: qty 1

## 2015-10-20 MED ORDER — CEPHALEXIN 250 MG PO CAPS
500.0000 mg | ORAL_CAPSULE | Freq: Once | ORAL | Status: AC
  Administered 2015-10-20: 500 mg via ORAL
  Filled 2015-10-20: qty 2

## 2015-10-20 MED ORDER — HYDROMORPHONE HCL 1 MG/ML IJ SOLN: 1.0000 mg | Freq: Once | INTRAMUSCULAR | Status: DC

## 2015-10-20 MED ORDER — CEPHALEXIN 500 MG PO CAPS: 500.0000 mg | ORAL_CAPSULE | Freq: Four times a day (QID) | ORAL | Status: DC

## 2015-10-20 MED ORDER — OXYCODONE-ACETAMINOPHEN 5-325 MG PO TABS: 1.0000 | ORAL_TABLET | ORAL | Status: DC | PRN

## 2015-10-20 MED ORDER — IBUPROFEN 400 MG PO TABS
800.0000 mg | ORAL_TABLET | Freq: Once | ORAL | Status: AC
  Administered 2015-10-20: 800 mg via ORAL
  Filled 2015-10-20: qty 2

## 2015-10-20 MED ORDER — HYDROMORPHONE HCL 1 MG/ML IJ SOLN
0.5000 mg | Freq: Once | INTRAMUSCULAR | Status: AC
  Administered 2015-10-20: 0.5 mg via INTRAMUSCULAR
  Filled 2015-10-20: qty 1

## 2015-10-20 NOTE — ED Notes (Signed)
Ortho paged for [facility]

## 2015-10-20 NOTE — ED Provider Notes (Signed)
CSN: [id number]     Arrival date & time 10/20/15  1721 History  By signing my name below, I, [name], attest that this documentation has been prepared under the direction and in the presence of Cheri FowlerKayla Mechille Varghese, PA-C Electronically Signed: [name], ED Scribe. 10/20/2015. 5:51 PM.   Chief Complaint  Patient presents with  . Finger Injury      The history is provided by the patient. No language interpreter was used.    Justin [name] is a 35 y.o. male with a medical hx of HIV who presents to the Emergency Department complaining of right thumb injury occurring PTA. Pt reports that his left thumb was crushed while at work by the "sword" on a welding machine. Pt notes that he was cleaning out the copper from the machine and noticed an obstruction and while trying to get the area cleared, his left thumb was sliced with the "sword." Pt reports that he is UTD on his tetanus at this time. Pt is left hand dominant. Pt states that he works for a [employer]. Pt is having associated symptoms of laceration to left thumb and left thumb swelling. He notes that he has tried dressing the wound without medications for the relief of his symptoms. He denies numbness and any other symptoms. Denies use of blood thinners.    Past Medical History  Diagnosis Date  . HIV (human immunodeficiency virus infection) (HCC)   . History of syphilis 08/25/2014    Treated by [facility] 07-2014.   History reviewed. No pertinent past surgical history. Family History  Problem Relation Age of Onset  . Adopted: Yes  . Family history unknown: Yes   Social History  Substance Use Topics  . Smoking status: Never Smoker   . Smokeless tobacco: Never Used  . Alcohol Use: 1.2 oz/week    2 Standard drinks or equivalent per week     Comment: occasional    Review of Systems  Musculoskeletal: Positive for joint swelling and arthralgias.  Skin: Positive for wound (laceration to left thumb). Negative for color change.   Neurological: Negative for numbness.       Tingling to right thumb  All other systems reviewed and are negative.     Allergies  Review of patient's allergies indicates no known allergies.  Home Medications   Prior to Admission medications   Medication Sig Start Date End Date Taking? Authorizing Provider  dolutegravir (TIVICAY) 50 MG tablet Take 1 tablet (50 mg total) by mouth daily. 12/18/14   Randall Hissornelius N Van Dam, MD  HYDROcodone-acetaminophen (NORCO/VICODIN) 5-325 MG tablet Take 1 tablet by mouth 2 (two) times daily as needed for severe pain. 06/28/15   Tomasita CrumbleAdeleke Oni, MD  ibuprofen (ADVIL,MOTRIN) 800 MG tablet Take 1 tablet (800 mg total) by mouth 3 (three) times daily. 09/12/13   Jerelyn ScottMartha Linker, MD  lamivudine (EPIVIR) 300 MG tablet Take 1 tablet (300 mg total) by mouth daily. This is study provided, do not fill prescription 12/19/14   Rich Reiningornelius N Van Dam, MD   BP 130/78 mmHg  Pulse 79  Temp(Src) 98.1 F (36.7 C) (Oral)  Resp 20  Ht 6' (1.829 m)  Wt 78.472 kg  BMI 23.46 kg/m2  SpO2 100% Physical Exam  Constitutional: He is oriented to person, place, and time. He appears well-developed and well-nourished.  HENT:  Head: Normocephalic and atraumatic.  Eyes: Conjunctivae are normal.  Neck: Normal range of motion.  Cardiovascular:  Pulses:      Radial pulses are 2+ on  the left side.  Capillary refill less than 3 seconds distal to injury.   Pulmonary/Chest: Effort normal. No respiratory distress.  Abdominal: He exhibits no distension.  Musculoskeletal:  Full active ROM of left thumb in abduction, adduction, flexion, extension, and finger thumb opposition. Deformity to left thumb with mild swelling.   Neurological: He is alert and oriented to person, place, and time.  Strength and sensation intact distal to injury.  Skin: Skin is warm and dry.  2 cm curved laceration to dorsal aspect of left thumb at base of MCP. No foreign bodies visualized or palpated in a bloodless field.  Laceration through epidermidis to subcutaneous tissue. No visualization of bone.  Nursing note and vitals reviewed.   ED Course  Procedures (including critical care time)  LACERATION REPAIR Performed by: Cheri Fowler Consent: Verbal consent obtained. Risks and benefits: risks, benefits and alternatives were discussed Patient identity confirmed: provided demographic data Time out performed prior to procedure Prepped and Draped in normal sterile fashion Wound explored Laceration Location: base of left thumb Laceration Length: 3cm No Foreign Bodies seen or palpated Anesthesia: local infiltration Local anesthetic: lidocaine 12% without epinephrine Anesthetic total: 5 ml Irrigation method: syringe Amount of cleaning: standard Skin closure: 5-0 prolene Number of sutures or staples: 6 Technique: simple interrupted Patient tolerance: Patient tolerated the procedure well with no immediate complications.  DIAGNOSTIC STUDIES: Oxygen Saturation is 100% on RA, nl by my interpretation.    COORDINATION OF CARE: 5:47 PM Discussed treatment plan with pt at bedside which includes UDS, left thumb xray, Norco, and pt agreed to plan.  6:58 PM- Consult with Hand surgeon, Dr. Amanda Pea, who recommends that the wound be irrigated and repaired. Pt will meet in office on 10/22/15 thursday morning at 8 am for further evaluation. Pt will also be Rx keflex 4 times daily.   7:03 PM- Prior to procedure beginning, Pt states "When I broke my wrist, they gave me something starting with "D", "D", "dilaudid", I need that before this is completed because its a lot of pain." Pt requesting dilaudid injection before laceration repair be completed.   Labs Review Labs Reviewed  URINE RAPID DRUG SCREEN, HOSP PERFORMED    Imaging Review Dg Finger Thumb Left  10/20/2015  CLINICAL DATA:  Crush injury. Injury at work, finger got caught in a machine. Laceration. EXAM: LEFT THUMB 2+V COMPARISON:  None. FINDINGS: Minimally  comminuted oblique fracture of the thumb proximal phalanx without significant displacement. No intra-articular extension. Soft tissue defect about the radial aspect of the metacarpal phalangeal joint consistent with laceration, with minimal adjacent soft tissue air. No radiopaque foreign body. IMPRESSION: Minimally comminuted oblique fracture of the thumb proximal phalanx. No intra-articular extension. Electronically Signed   By: Rubye Oaks M.D.   On: 10/20/2015 18:19   I have personally reviewed and evaluated these images and lab results as part of my medical decision-making.   EKG Interpretation None      MDM   Final diagnoses:  Thumb fracture, left, open, initial encounter  Laceration    Patient X-Ray shows minimally comminuted oblique fracture of thumb proximal phalanx.  No visualization of bone.  OR irrigation not indicated. No intra-articular extension.  Spoke with Dr. Amanda Pea, will perform thorough irrigation and close wound.  Discharge home with Keflex and Percocet.  Follow up with Dr. Amanda Pea, Thursday at 8 AM. Patient will be discharged home & is agreeable with above plan. Returns precautions discussed. Pt appears safe for discharge.  Patient continually asking for pain  medication and demanding faster treatment.  Security called to diffuse situation.  Patient attempted to leave AMA multiple times while demanding we fix him faster.  Discussed with patient multiple times that we are working as efficiently and quickly as we can to take care of all patients.  Patient ultimately left before signing discharge paperwork.  He did receive discharge prescriptions; however, he continually refused to sign "because it was taking up too much time, and that he had to leave".  After walking around the hallway holding his discharge paperwork stating we were taking too long, he left without signing.   I personally performed the services described in this documentation, which was scribed in my  presence. The recorded information has been reviewed and is accurate.    Cheri Fowler, PA-C 10/21/15 0028  Mancel Bale, MD 10/21/15 1059

## 2015-10-20 NOTE — Progress Notes (Signed)
Orthopedic Tech Progress Note Patient Details:  Justin [name] 23-May-1981 [id number]  Ortho Devices Type of Ortho Device: Thumb spica splint Ortho Device/Splint Location: lue Ortho Device/Splint Interventions: Ordered, Application   Trinna PostMartinez, Janel Beane J 10/20/2015, 8:20 PM

## 2015-10-20 NOTE — ED Notes (Signed)
Pt was at work and finger was crushed by the sword on the welding machine. Obvious deformity with laceration.
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
## 2015-10-20 NOTE — Discharge Instructions (Signed)
X-rays today showed a fracture at the base of your thumb. We've repaired your laceration with 6 sutures. Please follow-up with Dr. Cheree DittoGraham office on Thursday at 8 AM. Take Keflex 4 times a day which is an antibiotic that will help prevent infection. He may take Percocet every 4 hours as needed for pain. May also take 800 mg of ibuprofen for pain as well.  Finger Fracture Fractures of fingers are breaks in the bones of the fingers. There are many types of fractures. There are different ways of treating these fractures. Your health care provider will discuss the best way to treat your fracture. CAUSES Traumatic injury is the main cause of broken fingers. These include:  Injuries while playing sports.  Workplace injuries.  Falls. RISK FACTORS Activities that can increase your risk of finger fractures include:  Sports.  Workplace activities that involve machinery.  A condition called osteoporosis, which can make your bones less dense and cause them to fracture more easily. SIGNS AND SYMPTOMS The main symptoms of a broken finger are pain and swelling within 15 minutes after the injury. Other symptoms include:  Bruising of your finger.  Stiffness of your finger.  Numbness of your finger.  Exposed bones (compound fracture) if the fracture is severe. DIAGNOSIS  The best way to diagnose a broken bone is with X-ray imaging. Additionally, your health care provider will use this X-ray image to evaluate the position of the broken finger bones.  TREATMENT  Finger fractures can be treated with:   Nonreduction--This means the bones are in place. The finger is splinted without changing the positions of the bone pieces. The splint is usually left on for about a week to 10 days. This will depend on your fracture and what your health care provider thinks.  Closed reduction--The bones are put back into position without using surgery. The finger is then splinted.  Open reduction and internal  fixation--The fracture site is opened. Then the bone pieces are fixed into place with pins or some type of hardware. This is seldom required. It depends on the severity of the fracture. HOME CARE INSTRUCTIONS   Follow your health care provider's instructions regarding activities, exercises, and physical therapy.  Only take over-the-counter or prescription medicines for pain, discomfort, or fever as directed by your health care provider. SEEK MEDICAL CARE IF: You have pain or swelling that limits the motion or use of your fingers. SEEK IMMEDIATE MEDICAL CARE IF:  Your finger becomes numb. MAKE SURE YOU:   Understand these instructions.  Will watch your condition.  Will get help right away if you are not doing well or get worse.   This information is not intended to replace advice given to you by your health care provider. Make sure you discuss any questions you have with your health care provider.   Document Released: 10/02/2000 Document Revised: 04/10/2013 Document Reviewed: 01/30/2013 Elsevier Interactive Patient Education [employer]2016 Elsevier Inc.

## 2015-10-20 NOTE — ED Notes (Signed)
Pt snatched paperwork off counter without receiving discharge teaching and refused to sign for discharge and vitals. When asked to sign pt became verbally aggressive and stormed out.

## 2015-10-20 NOTE — ED Notes (Signed)
Pt being verbally abusive and aggressive with staff.  Pt is upset about pain medication administered, PA Kayla to bedside to speak with patient with this RN and offered further medication, pt began to cuss and yell and was unhappy with medication offered. Pt upset about delay, currently waiting for return call from hand surgeon to confirm plan of care and patient is aware of delay. We stepped out of room and called security and [employer] to bedside.

## 2015-10-22 ENCOUNTER — Ambulatory Visit: Payer: Self-pay

## 2015-12-14 ENCOUNTER — Telehealth: Payer: Self-pay | Admitting: *Deleted

## 2015-12-14 DIAGNOSIS — Z21 Asymptomatic human immunodeficiency virus [HIV] infection status: Secondary | ICD-10-CM

## 2015-12-14 MED ORDER — LAMIVUDINE 300 MG PO TABS: 300.0000 mg | ORAL_TABLET | Freq: Every day | ORAL | Status: DC

## 2015-12-14 MED ORDER — DOLUTEGRAVIR SODIUM 50 MG PO TABS: 50.0000 mg | ORAL_TABLET | Freq: Every day | ORAL | Status: DC

## 2015-12-14 NOTE — Telephone Encounter (Signed)
[facility] Application. 

## 2016-01-04 ENCOUNTER — Ambulatory Visit: Payer: Self-pay | Admitting: Internal Medicine

## 2016-01-27 ENCOUNTER — Emergency Department (HOSPITAL_COMMUNITY)
Admission: EM | Admit: 2016-01-27 | Discharge: 2016-01-27 | Disposition: A | Discharge status: Home / Self Care | Payer: Self-pay | Attending: Dermatology | Admitting: Dermatology

## 2016-01-27 ENCOUNTER — Encounter (HOSPITAL_COMMUNITY): Payer: Self-pay

## 2016-01-27 DIAGNOSIS — Z79899 Other long term (current) drug therapy: Secondary | ICD-10-CM | POA: Insufficient documentation

## 2016-01-27 DIAGNOSIS — Z5321 Procedure and treatment not carried out due to patient leaving prior to being seen by health care provider: Secondary | ICD-10-CM | POA: Insufficient documentation

## 2016-01-27 DIAGNOSIS — R531 Weakness: Secondary | ICD-10-CM | POA: Insufficient documentation

## 2016-01-27 NOTE — ED Triage Notes (Signed)
Pt is out of his HIV medications and in general doesn't feel good, he says he loses his train of thought, and he's thirsty.

## 2016-01-27 NOTE — ED Triage Notes (Signed)
Pt called again from triage, no answer 

## 2016-01-27 NOTE — ED Notes (Signed)
Offered pt a room in the back and he said he was ok in the waiting room

## 2016-02-27 ENCOUNTER — Emergency Department (HOSPITAL_COMMUNITY)
Admission: EM | Admit: 2016-02-27 | Discharge: 2016-02-27 | Disposition: A | Discharge status: Home / Self Care | Payer: Self-pay | Attending: Emergency Medicine | Admitting: Emergency Medicine

## 2016-02-27 ENCOUNTER — Encounter (HOSPITAL_COMMUNITY): Payer: Self-pay | Admitting: Emergency Medicine

## 2016-02-27 DIAGNOSIS — Z7721 Contact with and (suspected) exposure to potentially hazardous body fluids: Secondary | ICD-10-CM | POA: Insufficient documentation

## 2016-02-27 DIAGNOSIS — Z578 Occupational exposure to other risk factors: Secondary | ICD-10-CM

## 2016-02-27 DIAGNOSIS — Z791 Long term (current) use of non-steroidal anti-inflammatories (NSAID): Secondary | ICD-10-CM | POA: Insufficient documentation

## 2016-02-27 DIAGNOSIS — Z79899 Other long term (current) drug therapy: Secondary | ICD-10-CM | POA: Insufficient documentation

## 2016-02-27 LAB — RAPID HIV SCREEN (HIV 1/2 AB+AG)
HIV 1/2 Antibodies: REACTIVE — AB
HIV-1 P24 Antigen - HIV24: NONREACTIVE

## 2016-02-27 NOTE — Discharge Instructions (Signed)
Follow up at the [facility] office to discuss renewal of your medications.
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
## 2016-02-28 LAB — HIV-1/2 AB - DIFFERENTIATION
HIV 1 Ab: POSITIVE — AB
HIV 2 Ab: NEGATIVE

## 2016-02-28 LAB — HEPATITIS B SURFACE ANTIGEN: Hepatitis B Surface Ag: NEGATIVE

## 2016-02-29 LAB — T-HELPER CELLS (CD4) COUNT (NOT AT ARMC)
CD4 T CELL ABS: 500 /uL (ref 400–2700)
CD4 T CELL HELPER: 28 % — AB (ref 33–55)

## 2016-03-01 LAB — HEPATITIS C ANTIBODY (REFLEX): HCV Ab: >11 s/co ratio — ABNORMAL HIGH (ref 0.0–0.9)

## 2016-03-01 LAB — COMMENT2 - HEP PANEL

## 2016-06-23 ENCOUNTER — Encounter: Payer: Self-pay | Admitting: Internal Medicine

## 2016-06-23 ENCOUNTER — Ambulatory Visit: Payer: Self-pay

## 2016-09-06 ENCOUNTER — Other Ambulatory Visit (INDEPENDENT_AMBULATORY_CARE_PROVIDER_SITE_OTHER): Payer: Self-pay

## 2016-09-06 ENCOUNTER — Other Ambulatory Visit: Payer: Self-pay | Admitting: *Deleted

## 2016-09-06 DIAGNOSIS — B20 Human immunodeficiency virus [HIV] disease: Secondary | ICD-10-CM

## 2016-09-06 DIAGNOSIS — Z113 Encounter for screening for infections with a predominantly sexual mode of transmission: Secondary | ICD-10-CM

## 2016-09-06 LAB — COMPLETE METABOLIC PANEL WITH GFR
ALBUMIN: 4.3 g/dL (ref 3.6–5.1)
ALK PHOS: 68 U/L (ref 40–115)
ALT: 26 U/L (ref 9–46)
AST: 23 U/L (ref 10–40)
BUN: 11 mg/dL (ref 7–25)
CALCIUM: 10 mg/dL (ref 8.6–10.3)
CHLORIDE: 104 mmol/L (ref 98–110)
CO2: 30 mmol/L (ref 20–31)
CREATININE: 1.08 mg/dL (ref 0.60–1.35)
GFR, Est Non African American: 88 mL/min (ref >=60)
Glucose, Bld: 71 mg/dL (ref 65–99)
POTASSIUM: 4.6 mmol/L (ref 3.5–5.3)
Sodium: 141 mmol/L (ref 135–146)
Total Bilirubin: 1 mg/dL (ref 0.2–1.2)
Total Protein: 7.2 g/dL (ref 6.1–8.1)

## 2016-09-06 LAB — CBC WITH DIFFERENTIAL/PLATELET
Basophils Absolute: 0 cells/uL (ref 0–200)
Basophils Relative: 0 %
EOS ABS: 54 {cells}/uL (ref 15–500)
Eosinophils Relative: 1 %
HCT: 48 % (ref 38.5–50.0)
Hemoglobin: 16.2 g/dL (ref 13.2–17.1)
LYMPHS PCT: 34 %
Lymphs Abs: 1836 cells/uL (ref 850–3900)
MCH: 32.5 pg (ref 27.0–33.0)
MCHC: 33.8 g/dL (ref 32.0–36.0)
MCV: 96.4 fL (ref 80.0–100.0)
MONO ABS: 378 {cells}/uL (ref 200–950)
MPV: 9.7 fL (ref 7.5–12.5)
Monocytes Relative: 7 %
NEUTROS PCT: 58 %
Neutro Abs: 3132 cells/uL (ref 1500–7800)
Platelets: 207 10*3/uL (ref 140–400)
RBC: 4.98 MIL/uL (ref 4.20–5.80)
RDW: 12.8 % (ref 11.0–15.0)
WBC: 5.4 10*3/uL (ref 3.8–10.8)

## 2016-09-07 LAB — T-HELPER CELL (CD4) - (RCID CLINIC ONLY)
CD4 T CELL HELPER: 23 % — AB (ref 33–55)
CD4 T Cell Abs: 460 /uL (ref 400–2700)

## 2016-09-07 LAB — RPR

## 2016-09-07 LAB — URINE CYTOLOGY ANCILLARY ONLY
CHLAMYDIA, DNA PROBE: NEGATIVE
Neisseria Gonorrhea: NEGATIVE

## 2016-09-08 LAB — HIV-1 RNA QUANT-NO REFLEX-BLD
HIV 1 RNA Quant: 1510 copies/mL — ABNORMAL HIGH
HIV-1 RNA Quant, Log: 3.18 Log copies/mL — ABNORMAL HIGH

## 2016-09-09 ENCOUNTER — Encounter: Payer: Self-pay | Admitting: Internal Medicine

## 2016-09-26 ENCOUNTER — Ambulatory Visit (INDEPENDENT_AMBULATORY_CARE_PROVIDER_SITE_OTHER): Payer: Self-pay | Admitting: Internal Medicine

## 2016-09-26 ENCOUNTER — Encounter: Payer: Self-pay | Admitting: Internal Medicine

## 2016-09-26 VITALS — BP 107/80 | HR 124 | Temp 98.2°F | Ht 72.0 in | Wt 165.0 lb

## 2016-09-26 DIAGNOSIS — Z8619 Personal history of other infectious and parasitic diseases: Secondary | ICD-10-CM

## 2016-09-26 DIAGNOSIS — F419 Anxiety disorder, unspecified: Secondary | ICD-10-CM

## 2016-09-26 DIAGNOSIS — F1910 Other psychoactive substance abuse, uncomplicated: Secondary | ICD-10-CM | POA: Insufficient documentation

## 2016-09-26 DIAGNOSIS — B20 Human immunodeficiency virus [HIV] disease: Secondary | ICD-10-CM

## 2016-09-26 MED ORDER — BICTEGRAVIR-EMTRICITAB-TENOFOV 50-200-25 MG PO TABS: 1.0000 | ORAL_TABLET | Freq: Every day | ORAL | 5 refills | Status: DC

## 2016-09-26 NOTE — Assessment & Plan Note (Signed)
Will go to [facility] and discuss if any anti-anxiety medications is appropriate for him.  I explained tha tI do not prescribe benzodiazepines

## 2016-09-26 NOTE — Assessment & Plan Note (Signed)
Repeat titer is negative

## 2016-09-26 NOTE — Assessment & Plan Note (Signed)
Will start him on Biktarvy.  Labs in 3 weeks and follow up with me in 4 weeks.
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
## 2016-09-26 NOTE — Progress Notes (Signed)
CC: Follow up for HIV  Interval history: he comes in to reestablish care.  He was last seen by me in April 2016 and then was in a study and taking Tivicay and Emtriva.  He was doing well and had a suppressed virus but fell out of care, mainly attributed by him due to drug use and anxiety.  He is back now with a CD4 of 460 and viral load of 1510.  He is insterested in getting back into care.  He continues to be anxious, occasional methamphetamine use and hoping to get a better handle on this.  No weight loss, no diarrhea  Asking for Xanax for his anxiety  Prior to Admission medications   Medication Sig Start Date End Date Taking? Authorizing Provider  bictegravir-emtricitabine-tenofovir AF (BIKTARVY) 50-200-25 MG TABS tablet Take 1 tablet by mouth daily. 09/26/16   Gardiner Barefootobert W Comer, MD    Review of Systems Constitutional: negative for fatigue, malaise and anorexia Respiratory: negative for cough or hemoptysis Gastrointestinal: negative for diarrhea Integument/breast: negative for rash All other systems reviewed and are negative    Physical Exam: CONSTITUTIONAL:in no apparent distress  Vitals:   09/26/16 1511  BP: 107/80  Pulse: (!) 124  Temp: 98.2 F (36.8 C)   Eyes: anicteric HENT: no thrush, no cervical lymphadenopathy Respiratory: Normal respiratory effort; CTA B Cardiovascular: tachy rr GI: soft, nt Psych: some pressured speech but reversible, some slurred component to his speech but appropriate  Lab Results  Component Value Date   HIV1RNAQUANT 1,510 (H) 09/06/2016   HIV1RNAQUANT 13,497 (H) 08/19/2014   No components found for: HIV1GENOTYPRPLUS No components found for: THELPERCELL  SH: continued drug use but not daily

## 2016-10-03 ENCOUNTER — Ambulatory Visit: Payer: Self-pay

## 2016-10-17 ENCOUNTER — Other Ambulatory Visit: Payer: Self-pay

## 2016-10-24 ENCOUNTER — Ambulatory Visit: Payer: Self-pay | Admitting: Internal Medicine

## 2017-02-13 ENCOUNTER — Ambulatory Visit: Payer: Self-pay

## 2017-02-14 ENCOUNTER — Ambulatory Visit: Payer: Self-pay | Admitting: Internal Medicine

## 2017-02-14 ENCOUNTER — Ambulatory Visit: Payer: Self-pay

## 2017-02-21 ENCOUNTER — Encounter (HOSPITAL_COMMUNITY): Payer: Self-pay | Admitting: Family Medicine

## 2017-02-21 ENCOUNTER — Emergency Department (HOSPITAL_COMMUNITY): Payer: Self-pay

## 2017-02-21 DIAGNOSIS — M79642 Pain in left hand: Secondary | ICD-10-CM | POA: Insufficient documentation

## 2017-02-21 DIAGNOSIS — Y929 Unspecified place or not applicable: Secondary | ICD-10-CM | POA: Insufficient documentation

## 2017-02-21 DIAGNOSIS — Y999 Unspecified external cause status: Secondary | ICD-10-CM | POA: Insufficient documentation

## 2017-02-21 DIAGNOSIS — M25522 Pain in left elbow: Secondary | ICD-10-CM | POA: Insufficient documentation

## 2017-02-21 DIAGNOSIS — M25512 Pain in left shoulder: Secondary | ICD-10-CM | POA: Insufficient documentation

## 2017-02-21 DIAGNOSIS — Y939 Activity, unspecified: Secondary | ICD-10-CM | POA: Insufficient documentation

## 2017-02-21 DIAGNOSIS — Z5321 Procedure and treatment not carried out due to patient leaving prior to being seen by health care provider: Secondary | ICD-10-CM | POA: Insufficient documentation

## 2017-02-21 DIAGNOSIS — W11XXXA Fall on and from ladder, initial encounter: Secondary | ICD-10-CM | POA: Insufficient documentation

## 2017-02-21 NOTE — ED Triage Notes (Signed)
Patient was on a 8 foot ladder and fell. Patient is complaining of headache, left shoulder, left elbow, and left hand pain. Patient is unsure if he had a syncopal episode. Patient is alert, oriented x 4. Patient reports he is slightly nausea and lightheaded. Patient denies vomiting. Pt had a steady gait to triage. Patient was arguing that he had to answer social history questions for triage. Pt using cell phone while being triaged.

## 2017-02-22 ENCOUNTER — Emergency Department (HOSPITAL_COMMUNITY)
Admission: EM | Admit: 2017-02-22 | Discharge: 2017-02-22 | Disposition: A | Discharge status: Home / Self Care | Payer: Self-pay | Attending: Emergency Medicine | Admitting: Emergency Medicine

## 2017-02-22 NOTE — ED Notes (Signed)
Pt called to reassess vitals. 

## 2017-02-27 ENCOUNTER — Encounter: Payer: Self-pay | Admitting: Internal Medicine

## 2017-04-24 ENCOUNTER — Other Ambulatory Visit: Payer: Self-pay | Admitting: Internal Medicine

## 2017-05-03 ENCOUNTER — Ambulatory Visit (INDEPENDENT_AMBULATORY_CARE_PROVIDER_SITE_OTHER): Payer: Self-pay | Admitting: Internal Medicine

## 2017-05-03 ENCOUNTER — Ambulatory Visit (INDEPENDENT_AMBULATORY_CARE_PROVIDER_SITE_OTHER): Payer: Self-pay | Admitting: Licensed Clinical Social Worker

## 2017-05-03 ENCOUNTER — Encounter: Payer: Self-pay | Admitting: Internal Medicine

## 2017-05-03 VITALS — BP 134/78 | HR 105 | Temp 97.7°F | Ht 72.0 in | Wt 180.0 lb

## 2017-05-03 DIAGNOSIS — F1111 Opioid abuse, in remission: Secondary | ICD-10-CM

## 2017-05-03 DIAGNOSIS — F1910 Other psychoactive substance abuse, uncomplicated: Secondary | ICD-10-CM

## 2017-05-03 DIAGNOSIS — F1020 Alcohol dependence, uncomplicated: Secondary | ICD-10-CM

## 2017-05-03 DIAGNOSIS — F1220 Cannabis dependence, uncomplicated: Secondary | ICD-10-CM

## 2017-05-03 DIAGNOSIS — Z79899 Other long term (current) drug therapy: Secondary | ICD-10-CM

## 2017-05-03 DIAGNOSIS — R7881 Bacteremia: Secondary | ICD-10-CM

## 2017-05-03 DIAGNOSIS — Z113 Encounter for screening for infections with a predominantly sexual mode of transmission: Secondary | ICD-10-CM

## 2017-05-03 DIAGNOSIS — B20 Human immunodeficiency virus [HIV] disease: Secondary | ICD-10-CM

## 2017-05-03 DIAGNOSIS — F1411 Cocaine abuse, in remission: Secondary | ICD-10-CM

## 2017-05-03 DIAGNOSIS — F1521 Other stimulant dependence, in remission: Secondary | ICD-10-CM

## 2017-05-03 DIAGNOSIS — B9561 Methicillin susceptible Staphylococcus aureus infection as the cause of diseases classified elsewhere: Secondary | ICD-10-CM

## 2017-05-03 NOTE — Assessment & Plan Note (Signed)
Will check his labs today and confirm compliance and rtc 3 months

## 2017-05-03 NOTE — Assessment & Plan Note (Signed)
He was seen by the counselor today

## 2017-05-03 NOTE — Progress Notes (Signed)
   Subjective:    Patient ID: Justin [name], male    DOB: [birth year], 36 y.o.   MRN: [id number]  HPI Here for follow up of HIV Has a history of poor compliance and drug use including methamphetamine infection and recently developed mitral valve endocarditis, right wrist septic arthritis, left arm abscess in the setting of MSSA bacteremia. He [facility] in the [facility] for the duration of 6 weeks of IV cefzolin.   He since has be scared off from doing any drugs and considers this a 'wake up call' and has been clean since his hospitalization.  He is much better now and feels better.  Has not yet been to any support group.  He has continued to take Biktarvy and really no missed doses.  CD4 was down in the hospital but in the setting of acute infection.  His wrist is slowly improving and he will try to go back to Dr. Amanda PeaGramig whom he saw in the past for follow up once he is able to.    No associated n/v/d with Biktarvy, tolerating it well.    Review of Systems  Constitutional: Negative for chills, fatigue and fever.  Musculoskeletal: Positive for arthralgias. Negative for myalgias.       Wrist discomfort  Skin: Negative for rash.       Objective:   Physical Exam  Constitutional: He appears well-developed and well-nourished. No distress.  HENT:  Mouth/Throat: No oropharyngeal exudate.  Eyes: No scleral icterus.  Cardiovascular: Normal rate, regular rhythm and normal heart sounds.   No murmur heard. Pulmonary/Chest: Effort normal and breath sounds normal. No respiratory distress.  Musculoskeletal:  Right wrist with a little warmth, decreased rom Incision well-healed  Lymphadenopathy:    He has no cervical adenopathy.  Skin: No rash noted.   SH: now drug free, no alcohol       Assessment & Plan:

## 2017-05-03 NOTE — Assessment & Plan Note (Signed)
Will screen today 

## 2017-05-03 NOTE — Assessment & Plan Note (Signed)
I will do a surveillance blood culture to assure clearance

## 2017-05-03 NOTE — Assessment & Plan Note (Signed)
Lipid panel screen today

## 2017-05-04 LAB — CBC WITH DIFFERENTIAL/PLATELET
BASOS ABS: 18 {cells}/uL (ref 0–200)
Basophils Relative: 0.4 %
EOS PCT: 1.8 %
Eosinophils Absolute: 81 cells/uL (ref 15–500)
HCT: 48.4 % (ref 38.5–50.0)
Hemoglobin: 16.9 g/dL (ref 13.2–17.1)
Lymphs Abs: 1872 cells/uL (ref 850–3900)
MCH: 32.8 pg (ref 27.0–33.0)
MCHC: 34.9 g/dL (ref 32.0–36.0)
MCV: 94 fL (ref 80.0–100.0)
MONOS PCT: 8.1 %
MPV: 9.6 fL (ref 7.5–12.5)
NEUTROS PCT: 48.1 %
Neutro Abs: 2165 cells/uL (ref 1500–7800)
PLATELETS: 204 10*3/uL (ref 140–400)
RBC: 5.15 10*6/uL (ref 4.20–5.80)
RDW: 11.7 % (ref 11.0–15.0)
TOTAL LYMPHOCYTE: 41.6 %
WBC mixed population: 365 cells/uL (ref 200–950)
WBC: 4.5 10*3/uL (ref 3.8–10.8)

## 2017-05-04 LAB — COMPLETE METABOLIC PANEL WITH GFR
AG RATIO: 1.6 (calc) (ref 1.0–2.5)
ALBUMIN MSPROF: 4.7 g/dL (ref 3.6–5.1)
ALKALINE PHOSPHATASE (APISO): 86 U/L (ref 40–115)
ALT: 20 U/L (ref 9–46)
AST: 17 U/L (ref 10–40)
BILIRUBIN TOTAL: 1 mg/dL (ref 0.2–1.2)
BUN: 10 mg/dL (ref 7–25)
CHLORIDE: 104 mmol/L (ref 98–110)
CO2: 22 mmol/L (ref 20–32)
Calcium: 9.8 mg/dL (ref 8.6–10.3)
Creat: 1.01 mg/dL (ref 0.60–1.35)
GFR, EST AFRICAN AMERICAN: 110 mL/min/{1.73_m2} (ref >=60)
GFR, Est Non African American: 95 mL/min/{1.73_m2} (ref >=60)
GLUCOSE: 99 mg/dL (ref 65–99)
Globulin: 2.9 g/dL (calc) (ref 1.9–3.7)
POTASSIUM: 4 mmol/L (ref 3.5–5.3)
Sodium: 138 mmol/L (ref 135–146)
TOTAL PROTEIN: 7.6 g/dL (ref 6.1–8.1)

## 2017-05-04 LAB — LIPID PANEL
CHOLESTEROL: 149 mg/dL (ref <200)
HDL: 65 mg/dL (ref >40)
LDL Cholesterol (Calc): 71 mg/dL (calc)
NON-HDL CHOLESTEROL (CALC): 84 mg/dL (ref <130)
Total CHOL/HDL Ratio: 2.3 (calc) (ref <5.0)
Triglycerides: 53 mg/dL (ref <150)

## 2017-05-04 LAB — RPR TITER

## 2017-05-04 LAB — FLUORESCENT TREPONEMAL AB(FTA)-IGG-BLD: FLUORESCENT TREPONEMAL ABS: REACTIVE — AB

## 2017-05-04 LAB — RPR: RPR: REACTIVE — AB

## 2017-05-05 ENCOUNTER — Telehealth: Payer: Self-pay | Admitting: *Deleted

## 2017-05-05 LAB — HIV-1 RNA QUANT-NO REFLEX-BLD
HIV 1 RNA QUANT: NOT DETECTED {copies}/mL
HIV-1 RNA Quant, Log: <1.3 Log copies/mL

## 2017-05-05 LAB — T-HELPER CELL (CD4) - (RCID CLINIC ONLY)
CD4 % Helper T Cell: 26 % — ABNORMAL LOW (ref 33–55)
CD4 T Cell Abs: 540 /uL (ref 400–2700)

## 2017-05-05 LAB — URINE CYTOLOGY ANCILLARY ONLY
Chlamydia: NEGATIVE
NEISSERIA GONORRHEA: NEGATIVE

## 2017-05-05 NOTE — Telephone Encounter (Signed)
RN called patient to relay results and advice, but no answer and no voicemail. Will try again. Andree CossHowell, Franciscojavier Wronski M, RN
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
## 2017-05-09 LAB — CULTURE, BLOOD (SINGLE)
MICRO NUMBER:: 81221692
Result:: NO GROWTH
SPECIMEN QUALITY:: ADEQUATE

## 2017-05-11 ENCOUNTER — Ambulatory Visit: Payer: Self-pay | Admitting: Licensed Clinical Social Worker

## 2017-05-12 ENCOUNTER — Telehealth: Payer: Self-pay | Admitting: Licensed Clinical Social Worker

## 2017-05-12 NOTE — Telephone Encounter (Signed)
[facility] to missed appointment yesterday and was not able to leave message.  Phone rang and rang.  Vergia AlbertsSherry Jayzen Paver, [facility]

## 2017-05-26 ENCOUNTER — Other Ambulatory Visit: Payer: Self-pay | Admitting: Internal Medicine

## 2017-08-16 ENCOUNTER — Ambulatory Visit: Payer: Self-pay | Admitting: Internal Medicine

## 2017-08-17 ENCOUNTER — Ambulatory Visit (INDEPENDENT_AMBULATORY_CARE_PROVIDER_SITE_OTHER): Payer: Self-pay | Admitting: Internal Medicine

## 2017-08-17 ENCOUNTER — Encounter: Payer: Self-pay | Admitting: Internal Medicine

## 2017-08-17 VITALS — BP 122/79 | HR 89 | Temp 97.8°F | Ht 72.0 in | Wt 184.0 lb

## 2017-08-17 DIAGNOSIS — F1910 Other psychoactive substance abuse, uncomplicated: Secondary | ICD-10-CM

## 2017-08-17 DIAGNOSIS — B182 Chronic viral hepatitis C: Secondary | ICD-10-CM | POA: Insufficient documentation

## 2017-08-17 DIAGNOSIS — B20 Human immunodeficiency virus [HIV] disease: Secondary | ICD-10-CM

## 2017-08-17 HISTORY — DX: Chronic viral hepatitis C: B18.2

## 2017-08-17 NOTE — Progress Notes (Signed)
   Subjective:    Patient ID: Justin [name], male    DOB: 09/01/1980, 37 y.o.   MRN: [id number]  HPI Here for follow up of HIV Has a history of poor compliance and drug use including methamphetamine infection and recently developed mitral valve endocarditis, right wrist septic arthritis, left arm abscess in the setting of MSSA bacteremia. He [facility] for the duration of 6 weeks of IV cefzolin.   He since has be scared off from doing any drugs and considers this a 'wake up call' and has been clean since his hospitalization.  He is much better now and feels better.  Has not yet been to any support group.  He has continued to take Biktarvy and really no missed doses.  CD4 was down in the hospital but in the setting of acute infection.  His wrist is slowly improving and he will try to go back to Dr. Amanda PeaGramig whom he saw in the past for follow up once he is able to.    No associated n/v/d with Biktarvy, tolerating it well.    Review of Systems  Musculoskeletal: Negative for arthralgias.  Skin: Negative for rash.  Neurological: Negative for dizziness.       Objective:   Physical Exam  Constitutional: He appears well-developed and well-nourished. No distress.  HENT:  Mouth/Throat: No oropharyngeal exudate.  Eyes: No scleral icterus.  Cardiovascular: Normal rate, regular rhythm and normal heart sounds.  No murmur heard. Pulmonary/Chest: Effort normal and breath sounds normal. No respiratory distress.  Lymphadenopathy:    He has no cervical adenopathy.  Skin: No rash noted.  Psychiatric: He has a normal mood and affect.  Some redirectable pressured speech   SH: remains drug free, no alcohol       Assessment & Plan:

## 2017-08-17 NOTE — Assessment & Plan Note (Signed)
Doing well, labs today and rtc about 4 months.

## 2017-08-17 NOTE — Assessment & Plan Note (Signed)
Remains drug free and doing well.

## 2017-08-17 NOTE — Assessment & Plan Note (Addendum)
New evaluation for this. I will check his labs, ultrasound and rtc in 1 month and willconsider treatment

## 2017-08-18 LAB — HEPATITIS A ANTIBODY, TOTAL: Hepatitis A AB,Total: NONREACTIVE

## 2017-08-18 LAB — PROTIME-INR
INR: 1
Prothrombin Time: 10.1 s (ref 9.0–11.5)

## 2017-08-18 LAB — T-HELPER CELL (CD4) - (RCID CLINIC ONLY)
CD4 T CELL ABS: 660 /uL (ref 400–2700)
CD4 T CELL HELPER: 27 % — AB (ref 33–55)

## 2017-08-19 LAB — HIV-1 RNA QUANT-NO REFLEX-BLD
HIV 1 RNA Quant: <20 copies/mL — AB
HIV-1 RNA QUANT, LOG: DETECTED {Log_copies}/mL — AB

## 2017-08-21 ENCOUNTER — Ambulatory Visit (HOSPITAL_COMMUNITY): Payer: Self-pay

## 2017-08-23 LAB — HEPATITIS C RNA QUANTITATIVE
HCV Quantitative Log: 6.05 Log IU/mL — ABNORMAL HIGH
HCV RNA, PCR, QN: 1120000 [IU]/mL — AB

## 2017-08-23 LAB — HEPATITIS C GENOTYPE: HCV Genotype: 3

## 2017-08-24 ENCOUNTER — Encounter: Payer: Self-pay | Admitting: Internal Medicine

## 2017-09-15 ENCOUNTER — Ambulatory Visit (HOSPITAL_COMMUNITY)
Admission: RE | Admit: 2017-09-15 | Discharge: 2017-09-15 | Disposition: A | Discharge status: Home / Self Care | Payer: Self-pay | Source: Ambulatory Visit | Attending: Internal Medicine | Admitting: Internal Medicine

## 2017-09-15 DIAGNOSIS — B1920 Unspecified viral hepatitis C without hepatic coma: Secondary | ICD-10-CM | POA: Insufficient documentation

## 2017-09-15 DIAGNOSIS — B20 Human immunodeficiency virus [HIV] disease: Secondary | ICD-10-CM | POA: Insufficient documentation

## 2017-09-26 ENCOUNTER — Encounter: Payer: Self-pay | Admitting: Internal Medicine

## 2017-09-26 ENCOUNTER — Ambulatory Visit (INDEPENDENT_AMBULATORY_CARE_PROVIDER_SITE_OTHER): Payer: Self-pay | Admitting: Internal Medicine

## 2017-09-26 DIAGNOSIS — B20 Human immunodeficiency virus [HIV] disease: Secondary | ICD-10-CM

## 2017-09-26 DIAGNOSIS — F1910 Other psychoactive substance abuse, uncomplicated: Secondary | ICD-10-CM

## 2017-09-26 DIAGNOSIS — B182 Chronic viral hepatitis C: Secondary | ICD-10-CM

## 2017-09-26 MED ORDER — GLECAPREVIR-PIBRENTASVIR 100-40 MG PO TABS: 3.0000 | ORAL_TABLET | Freq: Every day | ORAL | 2 refills | Status: DC

## 2017-09-26 MED ORDER — BICTEGRAVIR-EMTRICITAB-TENOFOV 50-200-25 MG PO TABS: 1.0000 | ORAL_TABLET | Freq: Every day | ORAL | 11 refills | Status: DC

## 2017-09-26 NOTE — Assessment & Plan Note (Signed)
He remains drug free and I encouraged him.

## 2017-09-26 NOTE — Assessment & Plan Note (Signed)
I discussed treatment options and will get him on Mavyret via [facility].  Sent today.  He will follow up in 4 weeks with me

## 2017-09-26 NOTE — Progress Notes (Signed)
   Subjective:    Patient ID: Justin [name], male    DOB: Mar 08, 1981, 37 y.o.   MRN: [id number]  HPI Here for follow up of chronic hepatitis C. He is coinfected with HIV and well-controlled on Biktarvy. Previously with signficant drug use and now remains clean.   Has chronic hepatitis C with genotype 3.  Ultrasound without any cirrhosis.  No associated fatigue.  Last CD4 660 and viral load < 20 x 2.     Review of Systems  Constitutional: Negative for fatigue.  Gastrointestinal: Negative for diarrhea and nausea.  Skin: Negative for rash.       Objective:   Physical Exam  Constitutional: He appears well-developed and well-nourished. No distress.  HENT:  Mouth/Throat: No oropharyngeal exudate.  Eyes: No scleral icterus.  Cardiovascular: Normal rate, regular rhythm and normal heart sounds.  No murmur heard. Pulmonary/Chest: Effort normal and breath sounds normal. No respiratory distress.  Skin: No rash noted.   SH: remains drug-free       Assessment & Plan:

## 2017-09-26 NOTE — Assessment & Plan Note (Signed)
He is doing well on Biktarvy and refills sent today.  rtc 3-4 months for this

## 2017-11-01 ENCOUNTER — Ambulatory Visit: Payer: Self-pay | Admitting: Internal Medicine

## 2017-11-07 ENCOUNTER — Encounter: Payer: Self-pay | Admitting: Internal Medicine

## 2017-11-07 ENCOUNTER — Ambulatory Visit (INDEPENDENT_AMBULATORY_CARE_PROVIDER_SITE_OTHER): Payer: Self-pay | Admitting: Internal Medicine

## 2017-11-07 ENCOUNTER — Ambulatory Visit (INDEPENDENT_AMBULATORY_CARE_PROVIDER_SITE_OTHER): Payer: Self-pay | Admitting: Licensed Clinical Social Worker

## 2017-11-07 VITALS — BP 116/78 | HR 70 | Temp 97.9°F | Ht 72.0 in | Wt 180.4 lb

## 2017-11-07 DIAGNOSIS — F1910 Other psychoactive substance abuse, uncomplicated: Secondary | ICD-10-CM

## 2017-11-07 DIAGNOSIS — B20 Human immunodeficiency virus [HIV] disease: Secondary | ICD-10-CM

## 2017-11-07 DIAGNOSIS — B182 Chronic viral hepatitis C: Secondary | ICD-10-CM

## 2017-11-07 DIAGNOSIS — F3181 Bipolar II disorder: Secondary | ICD-10-CM

## 2017-11-07 NOTE — Assessment & Plan Note (Signed)
Remains drug free.   He met with our counselor today to discuss continued coping mechanisms and other issues.

## 2017-11-07 NOTE — Progress Notes (Signed)
   Subjective:    Patient ID: Justin [name], male    DOB: 1981/01/02, 37 y.o.   MRN: [id number]  HPI Here for follow up of chronic hepatitis C. He is a patient of mine with well controlled HIV on Biktarvy and now on treatment with Mavyret for 8 weeks for hepatitis C stemming from his history of drug abuse.  Some associated fatigue and initially some headache but now resolved.  Hepatitis A non-immune, hepatitis B immune. Continues to remain drug free and feeling good.  Some concern expressed with his dog and how he would feel if he lost his dog which has been his support mechanism.     Review of Systems  Constitutional: Positive for fatigue. Negative for unexpected weight change.  Gastrointestinal: Negative for diarrhea.  Skin: Negative for rash.  Neurological: Negative for dizziness and headaches.       Objective:   Physical Exam  Constitutional: He appears well-developed and well-nourished. No distress.  HENT:  Mouth/Throat: No oropharyngeal exudate.  Eyes: No scleral icterus.  Cardiovascular: Normal rate, regular rhythm and normal heart sounds.  No murmur heard. Pulmonary/Chest: Effort normal and breath sounds normal. No respiratory distress.  Skin: No rash noted.   SH: some marijuana but remains otherwise drug-free       Assessment & Plan:

## 2017-11-07 NOTE — Assessment & Plan Note (Signed)
Will check his rna today and he can return in July for EOT lab and usual HIV labs.

## 2017-11-07 NOTE — Assessment & Plan Note (Signed)
Doing well, continuing on his Biktarvy.

## 2017-11-07 NOTE — BH Specialist Note (Signed)
Integrated Behavioral Health Initial Visit  MRN: [id number] Name: Justin [name]  Number of Integrated Behavioral Health Clinician visits:: 1/6 Session Start time: 11:52am  Session End time: 12:12pm Total time: 20 minutes  Type of Service: Integrated Behavioral Health- Individual/Family Interpretor:No. Interpretor Name and Language: n/a   Warm Hand Off Completed.       SUBJECTIVE: Justin [name] is a 37 y.o. male accompanied by self Patient was referred by Dr [name]  for hypomania. Patient reports the following symptoms/concerns: heightened energy levels, trouble with sleep, racing thoughts, increased cravings/dreams of using  Duration of problem: ; Severity of problem: moderate  OBJECTIVE: Mood: Euphoric and Affect: Labile Risk of harm to self or others: No plan to harm self or others  LIFE CONTEXT: Family and Social: Patient reports that he lives in a bad environment, as almost everyone who ever comes to his house drinks and uses cocaine. 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 He states that he has been sober "without any help" since that time and that his life is going better than it has in a long time but he needs to learn ways to cope without the substances. Patient has also had some unwanted thoughts that are of a more depressing nature; for example, his dog is his main therapeutic support and he cannot stop thinking about the dog dying. School/Work: Patient has applied for disability and is waiting to hear a decision. 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    GOALS ADDRESSED: Patient will: 1. Reduce symptoms of: hypomania 2. Increase knowledge and/or ability of: coping through sobriety    INTERVENTIONS: Interventions utilized: Motivational Interviewing and Supportive Counseling     ASSESSMENT: Patient currently experiencing hypomanic symptoms: racing thoughts, pressured speech, labile affect, expansive mood, trouble sleeping, increased energy, some troublesome/intrusive thoughts, possible grandiosity (slight). History and symptomology are congruent with a diagnosis of Bipolar II Disorder. Patient processed with counselor his desire for recovery and how he has been progressing thus far. Counselor explored with him how success and celebration can be a trigger just as much as bad fortune. Patient and counselor discussed ways to handle positive success at this point, including spending time with positive and supportive friends (bandmates) and having an accountability partner.    Patient may benefit from ongoing counseling and referral to a psychiatrist.  PLAN: 1. Follow up with behavioral health clinician on : 5/13@ 2:30   Angus Palms, LCSW

## 2017-11-09 LAB — HEPATITIS C RNA QUANTITATIVE
HCV QUANT LOG: NOT DETECTED {Log_IU}/mL
HCV RNA, PCR, QN: NOT DETECTED [IU]/mL

## 2017-11-13 ENCOUNTER — Ambulatory Visit: Payer: Self-pay | Admitting: Licensed Clinical Social Worker

## 2018-01-08 ENCOUNTER — Ambulatory Visit: Payer: Self-pay | Admitting: Internal Medicine

## 2018-03-08 ENCOUNTER — Ambulatory Visit: Payer: Self-pay | Admitting: Internal Medicine

## 2018-03-13 ENCOUNTER — Encounter (HOSPITAL_COMMUNITY): Payer: Self-pay | Admitting: Emergency Medicine

## 2018-03-13 ENCOUNTER — Emergency Department (HOSPITAL_COMMUNITY)
Admission: EM | Admit: 2018-03-13 | Discharge: 2018-03-13 | Disposition: A | Discharge status: Home / Self Care | Payer: Self-pay | Attending: Emergency Medicine | Admitting: Emergency Medicine

## 2018-03-13 DIAGNOSIS — T401X1A Poisoning by heroin, accidental (unintentional), initial encounter: Secondary | ICD-10-CM | POA: Insufficient documentation

## 2018-03-13 DIAGNOSIS — Z79899 Other long term (current) drug therapy: Secondary | ICD-10-CM | POA: Insufficient documentation

## 2018-03-13 DIAGNOSIS — Z21 Asymptomatic human immunodeficiency virus [HIV] infection status: Secondary | ICD-10-CM | POA: Insufficient documentation

## 2018-03-13 DIAGNOSIS — Z87891 Personal history of nicotine dependence: Secondary | ICD-10-CM | POA: Insufficient documentation

## 2018-03-13 DIAGNOSIS — F419 Anxiety disorder, unspecified: Secondary | ICD-10-CM | POA: Insufficient documentation

## 2018-03-13 LAB — BASIC METABOLIC PANEL
Anion gap: 10 (ref 5–15)
BUN: 11 mg/dL (ref 6–20)
CO2: 25 mmol/L (ref 22–32)
CREATININE: 1.2 mg/dL (ref 0.61–1.24)
Calcium: 9.2 mg/dL (ref 8.9–10.3)
Chloride: 107 mmol/L (ref 98–111)
Glucose, Bld: 118 mg/dL — ABNORMAL HIGH (ref 70–99)
Potassium: 3.5 mmol/L (ref 3.5–5.1)
SODIUM: 142 mmol/L (ref 135–145)

## 2018-03-13 LAB — CBC WITH DIFFERENTIAL/PLATELET
BASOS PCT: 0 %
Basophils Absolute: 0 10*3/uL (ref 0.0–0.1)
EOS ABS: 0 10*3/uL (ref 0.0–0.7)
EOS PCT: 1 %
HCT: 44.1 % (ref 39.0–52.0)
Hemoglobin: 15.2 g/dL (ref 13.0–17.0)
LYMPHS ABS: 1.6 10*3/uL (ref 0.7–4.0)
Lymphocytes Relative: 23 %
MCH: 33.9 pg (ref 26.0–34.0)
MCHC: 34.5 g/dL (ref 30.0–36.0)
MCV: 98.4 fL (ref 78.0–100.0)
Monocytes Absolute: 0.9 10*3/uL (ref 0.1–1.0)
Monocytes Relative: 12 %
NEUTROS PCT: 64 %
Neutro Abs: 4.6 10*3/uL (ref 1.7–7.7)
PLATELETS: 215 10*3/uL (ref 150–400)
RBC: 4.48 MIL/uL (ref 4.22–5.81)
RDW: 12.8 % (ref 11.5–15.5)
WBC: 7.2 10*3/uL (ref 4.0–10.5)

## 2018-03-13 MED ORDER — ONDANSETRON 4 MG PO TBDP
4.0000 mg | ORAL_TABLET | Freq: Once | ORAL | Status: AC
  Administered 2018-03-13: 4 mg via ORAL
  Filled 2018-03-13: qty 1

## 2018-03-13 NOTE — ED Provider Notes (Signed)
[name] DEPT Provider Note   CSN: [id number] Arrival date & time: 03/13/18  1926     History   Chief Complaint Chief Complaint  Patient presents with  . Drug Overdose    heroin    HPI Justin [name] is a 37 y.o. male.  37 yo M with a chief complaint of heroin overdose.  Patient recently never used before.  He met a friend who had some and he decided to try it.  Look up surrounded by EMS professionals.  Had a nasal trumpet in place.  He is having some abdominal cramping and some nausea.  Denies any coingestions.  Denies any trouble breathing or chest pain.  The history is provided by the patient.  Drug Overdose  This is a new problem. The current episode started less than 1 hour ago. The problem occurs rarely. The problem has been resolved. Associated symptoms include abdominal pain (cramping). Pertinent negatives include no chest pain, no headaches and no shortness of breath. Nothing aggravates the symptoms. Nothing relieves the symptoms. He has tried nothing for the symptoms. The treatment provided no relief.    Past Medical History:  Diagnosis Date  . History of syphilis 08/25/2014   Treated by [facility] 07-2014.  [name] HIV (human immunodeficiency virus infection) [facility])     Patient Active Problem List   Diagnosis Date Noted  . Chronic hepatitis C without hepatic coma ([place]) 08/17/2017  . Screening examination for venereal disease 05/03/2017  . Encounter for long-term (current) use of high-risk medication 05/03/2017  . Anxiety 09/26/2016  . Substance abuse ([place]) 09/26/2016  . Human immunodeficiency virus (HIV) disease ([place]) 09/02/2014  . History of syphilis 08/25/2014    History reviewed. No pertinent surgical history.      Home Medications    Prior to Admission medications   Medication Sig Start Date End Date Taking? Authorizing Provider  bictegravir-emtricitabine-tenofovir AF (BIKTARVY) 50-200-25 MG TABS tablet Take 1 tablet by mouth  daily. 09/26/17  Yes Comer, Okey Regal, MD  Glecaprevir-Pibrentasvir (MAVYRET) 100-40 MG TABS Take 3 tablets by mouth daily. Patient not taking: Reported on 03/13/2018 09/26/17   Thayer Headings, MD    Family History Family History  Adopted: Yes  Family history unknown: Yes    Social History Social History   Tobacco Use  . Smoking status: Former Smoker    Packs/day: 1.00    Types: Cigarettes  . Smokeless tobacco: Never Used  Substance Use Topics  . Alcohol use: Yes    Alcohol/week: 2.0 standard drinks    Types: 2 Standard drinks or equivalent per week    Comment: Twice a year  . Drug use: No    Frequency: 14.0 times per week    Types: Marijuana, Methamphetamines    Comment: Denies but has hisotry      Allergies   Patient has no known allergies.   Review of Systems Review of Systems  Constitutional: Negative for chills and fever.  HENT: Negative for congestion and facial swelling.   Eyes: Negative for discharge and visual disturbance.  Respiratory: Negative for shortness of breath.   Cardiovascular: Negative for chest pain and palpitations.  Gastrointestinal: Positive for abdominal pain (cramping) and nausea. Negative for diarrhea and vomiting.  Musculoskeletal: Negative for arthralgias and myalgias.  Skin: Negative for color change and rash.  Neurological: Negative for tremors, syncope and headaches.  Psychiatric/Behavioral: Negative for confusion and dysphoric mood.     Physical Exam Updated Vital Signs BP 137/90 (BP Location: Right Arm)  Pulse (!) 54   Temp (!) 97.5 F (36.4 C) (Oral)   Resp 16   SpO2 100%   Physical Exam  Constitutional: He is oriented to person, place, and time. He appears well-developed and well-nourished.  HENT:  Head: Normocephalic and atraumatic.  Eyes: Pupils are equal, round, and reactive to light. EOM are normal.  Neck: Normal range of motion. Neck supple. No JVD present.  Cardiovascular: Normal rate and regular rhythm. Exam  reveals no gallop and no friction rub.  No murmur heard. Pulmonary/Chest: No respiratory distress. He has no wheezes.  Abdominal: He exhibits no distension and no mass. There is no tenderness. There is no rebound and no guarding.  Musculoskeletal: Normal range of motion.  Neurological: He is alert and oriented to person, place, and time.  Skin: No rash noted. He is diaphoretic. No pallor.  Psychiatric: He has a normal mood and affect. His behavior is normal.  Nursing note and vitals reviewed.    ED Treatments / Results  Labs (all labs ordered are listed, but only abnormal results are displayed) Labs Reviewed  BASIC METABOLIC PANEL - Abnormal; Notable for the following components:      Result Value   Glucose, Bld 118 (*)    All other components within normal limits  CBC WITH DIFFERENTIAL/PLATELET    EKG None  Radiology No results found.  Procedures Procedures (including critical care time)  Medications Ordered in ED Medications  ondansetron (ZOFRAN-ODT) disintegrating tablet 4 mg (4 mg Oral Given 03/13/18 2035)     Initial Impression / Assessment and Plan / ED Course  I have reviewed the triage vital signs and the nursing notes.  Pertinent labs & imaging results that were available during my care of the patient were reviewed by me and considered in my medical decision making (see chart for details).     37 yo M with a chief complaint of heroin overdose.  Patient is a bit sleepy on my initial exam and is diaphoretic.  Will obtain lab work and observe in the ED.  Labs unremarkable, hgb normal.  BMP with mild glucose elevation. Improved mental status.  D/c home.  11:37 PM:  I have discussed the diagnosis/risks/treatment options with the patient and family and believe the pt to be eligible for discharge home to follow-up with PCP. We also discussed returning to the ED immediately if new or worsening sx occur. We discussed the sx which are most concerning (e.g., sudden  worsening pain, fever, inability to tolerate by mouth) that necessitate immediate return. Medications administered to the patient during their visit and any new prescriptions provided to the patient are listed below.  Medications given during this visit Medications  ondansetron (ZOFRAN-ODT) disintegrating tablet 4 mg (4 mg Oral Given 03/13/18 2035)      The patient appears reasonably screen and/or stabilized for discharge and I doubt any other medical condition or other [facility] requiring further screening, evaluation, or treatment in the ED at this time prior to discharge.    Final Clinical Impressions(s) / ED Diagnoses   Final diagnoses:  Accidental overdose of heroin, initial encounter [facility])    ED Discharge Orders    None       Deno Etienne, DO 03/13/18 2337

## 2018-03-13 NOTE — ED Notes (Signed)
Bed: WA04 Expected date:  Expected time:  Means of arrival:  Comments: Heroin overdose

## 2018-03-13 NOTE — ED Notes (Signed)
ED Provider at bedside. 

## 2018-03-13 NOTE — ED Notes (Signed)
Justin [name] (Brother) [phone] (cell)

## 2018-03-13 NOTE — Discharge Instructions (Signed)
There is help if you need it.  Please do not use dirty needles, this could cause you a severe infection to your skin, heart or spinal cord.  This could kill you or leave you permanently disabled.   ° °[place] [facility] to the Opioid Problem ([facility]STOP) °Fixed; mobile; peer-based °Justin [name] °(336) 505-8122 °[email or web address] °Fixed site exchange at [employer], Wednesdays (2-5pm) and Thursdays (3-8pm). °[address] °[place], [place] [address] °Call or text to arrange mobile and peer exchange, Mondays (1-4pm) and Fridays (4-7pm). °Serving [place] ° °

## 2018-03-13 NOTE — ED Triage Notes (Addendum)
Per EMS, pt. From home with complaint of heroin overdose at around  0600pm this evening , friend reported to EMS that pt. Was found agonal and unresponsive. Narcan 0.5mg  inj given, pt. Became conscious, alert and oriented x4. Denied of SOB nor chest pain, calm and cooperative.

## 2018-05-09 ENCOUNTER — Encounter: Payer: Self-pay | Admitting: Internal Medicine

## 2018-05-14 ENCOUNTER — Other Ambulatory Visit: Payer: Self-pay | Admitting: *Deleted

## 2018-05-14 ENCOUNTER — Other Ambulatory Visit: Payer: Self-pay

## 2018-05-14 DIAGNOSIS — Z113 Encounter for screening for infections with a predominantly sexual mode of transmission: Secondary | ICD-10-CM

## 2018-05-14 DIAGNOSIS — B182 Chronic viral hepatitis C: Secondary | ICD-10-CM

## 2018-05-14 DIAGNOSIS — B20 Human immunodeficiency virus [HIV] disease: Secondary | ICD-10-CM

## 2018-05-14 DIAGNOSIS — Z79899 Other long term (current) drug therapy: Secondary | ICD-10-CM

## 2018-05-28 ENCOUNTER — Encounter: Payer: Self-pay | Admitting: Internal Medicine

## 2018-08-21 ENCOUNTER — Other Ambulatory Visit: Payer: Self-pay

## 2018-08-21 DIAGNOSIS — B20 Human immunodeficiency virus [HIV] disease: Secondary | ICD-10-CM

## 2018-08-21 DIAGNOSIS — B182 Chronic viral hepatitis C: Secondary | ICD-10-CM

## 2018-08-21 DIAGNOSIS — Z79899 Other long term (current) drug therapy: Secondary | ICD-10-CM

## 2018-08-21 DIAGNOSIS — Z113 Encounter for screening for infections with a predominantly sexual mode of transmission: Secondary | ICD-10-CM

## 2018-08-22 ENCOUNTER — Encounter: Payer: Self-pay | Admitting: Internal Medicine

## 2018-08-22 LAB — T-HELPER CELL (CD4) - (RCID CLINIC ONLY)
CD4 % Helper T Cell: 25 % — ABNORMAL LOW (ref 33–55)
CD4 T Cell Abs: 450 /uL (ref 400–2700)

## 2018-08-23 LAB — COMPLETE METABOLIC PANEL WITH GFR
AG RATIO: 1.2 (calc) (ref 1.0–2.5)
ALBUMIN MSPROF: 4.2 g/dL (ref 3.6–5.1)
ALT: 13 U/L (ref 9–46)
AST: 19 U/L (ref 10–40)
Alkaline phosphatase (APISO): 92 U/L (ref 36–130)
BILIRUBIN TOTAL: 1.2 mg/dL (ref 0.2–1.2)
BUN: 12 mg/dL (ref 7–25)
CALCIUM: 10 mg/dL (ref 8.6–10.3)
CO2: 27 mmol/L (ref 20–32)
Chloride: 102 mmol/L (ref 98–110)
Creat: 1.11 mg/dL (ref 0.60–1.35)
GFR, EST AFRICAN AMERICAN: 98 mL/min/{1.73_m2} (ref >=60)
GFR, EST NON AFRICAN AMERICAN: 84 mL/min/{1.73_m2} (ref >=60)
Globulin: 3.5 g/dL (calc) (ref 1.9–3.7)
Glucose, Bld: 120 mg/dL — ABNORMAL HIGH (ref 65–99)
Potassium: 4.3 mmol/L (ref 3.5–5.3)
Sodium: 138 mmol/L (ref 135–146)
TOTAL PROTEIN: 7.7 g/dL (ref 6.1–8.1)

## 2018-08-23 LAB — CBC WITH DIFFERENTIAL/PLATELET
ABSOLUTE MONOCYTES: 554 {cells}/uL (ref 200–950)
BASOS PCT: 0.3 %
Basophils Absolute: 19 cells/uL (ref 0–200)
EOS ABS: 63 {cells}/uL (ref 15–500)
Eosinophils Relative: 1 %
HCT: 43.1 % (ref 38.5–50.0)
Hemoglobin: 14.9 g/dL (ref 13.2–17.1)
LYMPHS ABS: 1651 {cells}/uL (ref 850–3900)
MCH: 31.4 pg (ref 27.0–33.0)
MCHC: 34.6 g/dL (ref 32.0–36.0)
MCV: 90.9 fL (ref 80.0–100.0)
MPV: 10.2 fL (ref 7.5–12.5)
Monocytes Relative: 8.8 %
Neutro Abs: 4013 cells/uL (ref 1500–7800)
Neutrophils Relative %: 63.7 %
PLATELETS: 241 10*3/uL (ref 140–400)
RBC: 4.74 10*6/uL (ref 4.20–5.80)
RDW: 11.9 % (ref 11.0–15.0)
TOTAL LYMPHOCYTE: 26.2 %
WBC: 6.3 10*3/uL (ref 3.8–10.8)

## 2018-08-23 LAB — HEPATITIS C RNA QUANTITATIVE
HCV QUANT LOG: 1.9 {Log_IU}/mL — AB
HCV RNA, PCR, QN: 80 [IU]/mL — AB

## 2018-08-23 LAB — LIPID PANEL
Cholesterol: 164 mg/dL (ref <200)
HDL: 47 mg/dL (ref >=40)
LDL CHOLESTEROL (CALC): 103 mg/dL — AB
NON-HDL CHOLESTEROL (CALC): 117 mg/dL (ref <130)
Total CHOL/HDL Ratio: 3.5 (calc) (ref <5.0)
Triglycerides: 58 mg/dL (ref <150)

## 2018-08-23 LAB — HIV-1 RNA QUANT-NO REFLEX-BLD
HIV 1 RNA QUANT: DETECTED {copies}/mL — AB
HIV-1 RNA Quant, Log: <1.3 Log copies/mL — AB

## 2018-08-23 LAB — RPR: RPR Ser Ql: NONREACTIVE

## 2018-09-17 ENCOUNTER — Encounter: Payer: Self-pay | Admitting: Internal Medicine

## 2018-09-27 ENCOUNTER — Encounter: Payer: Self-pay | Admitting: Internal Medicine

## 2018-10-02 ENCOUNTER — Ambulatory Visit: Payer: Self-pay | Admitting: Internal Medicine

## 2018-10-05 ENCOUNTER — Other Ambulatory Visit: Payer: Self-pay | Admitting: Internal Medicine

## 2018-10-19 ENCOUNTER — Other Ambulatory Visit: Payer: Self-pay | Admitting: Internal Medicine

## 2018-10-23 ENCOUNTER — Telehealth: Payer: Self-pay | Admitting: Internal Medicine

## 2018-10-23 NOTE — Telephone Encounter (Signed)
Patient has no refills left on his medication and has been out for 3 days.  He is working all day today so he can't be reached by phone.  I told him to check with [facility] later this afternoon.

## 2018-11-27 ENCOUNTER — Emergency Department (HOSPITAL_COMMUNITY): Payer: Self-pay

## 2018-11-27 ENCOUNTER — Encounter (HOSPITAL_COMMUNITY): Payer: Self-pay | Admitting: Family Medicine

## 2018-11-27 ENCOUNTER — Emergency Department (HOSPITAL_COMMUNITY)
Admission: EM | Admit: 2018-11-27 | Discharge: 2018-11-28 | Disposition: A | Discharge status: Home / Self Care | Payer: Self-pay | Attending: Emergency Medicine | Admitting: Emergency Medicine

## 2018-11-27 DIAGNOSIS — Z79899 Other long term (current) drug therapy: Secondary | ICD-10-CM | POA: Insufficient documentation

## 2018-11-27 DIAGNOSIS — Z21 Asymptomatic human immunodeficiency virus [HIV] infection status: Secondary | ICD-10-CM | POA: Insufficient documentation

## 2018-11-27 DIAGNOSIS — R51 Headache: Secondary | ICD-10-CM | POA: Insufficient documentation

## 2018-11-27 DIAGNOSIS — F17210 Nicotine dependence, cigarettes, uncomplicated: Secondary | ICD-10-CM | POA: Insufficient documentation

## 2018-11-27 DIAGNOSIS — R569 Unspecified convulsions: Secondary | ICD-10-CM | POA: Insufficient documentation

## 2018-11-27 HISTORY — DX: Endocarditis, valve unspecified: I38

## 2018-11-27 LAB — URINALYSIS, ROUTINE W REFLEX MICROSCOPIC
Bilirubin Urine: NEGATIVE
Glucose, UA: NEGATIVE mg/dL
Hgb urine dipstick: NEGATIVE
Ketones, ur: NEGATIVE mg/dL
Leukocytes,Ua: NEGATIVE
Nitrite: NEGATIVE
Protein, ur: NEGATIVE mg/dL
Specific Gravity, Urine: 1.038 — ABNORMAL HIGH (ref 1.005–1.030)
pH: 5 (ref 5.0–8.0)

## 2018-11-27 LAB — CBC WITH DIFFERENTIAL/PLATELET
Abs Immature Granulocytes: 0.02 10*3/uL (ref 0.00–0.07)
Basophils Absolute: 0 10*3/uL (ref 0.0–0.1)
Basophils Relative: 0 %
Eosinophils Absolute: 0.2 10*3/uL (ref 0.0–0.5)
Eosinophils Relative: 3 %
HCT: 40.2 % (ref 39.0–52.0)
Hemoglobin: 13.2 g/dL (ref 13.0–17.0)
Immature Granulocytes: 0 %
Lymphocytes Relative: 26 %
Lymphs Abs: 1.6 10*3/uL (ref 0.7–4.0)
MCH: 32.5 pg (ref 26.0–34.0)
MCHC: 32.8 g/dL (ref 30.0–36.0)
MCV: 99 fL (ref 80.0–100.0)
Monocytes Absolute: 0.6 10*3/uL (ref 0.1–1.0)
Monocytes Relative: 10 %
Neutro Abs: 4 10*3/uL (ref 1.7–7.7)
Neutrophils Relative %: 61 %
Platelets: 143 10*3/uL — ABNORMAL LOW (ref 150–400)
RBC: 4.06 MIL/uL — ABNORMAL LOW (ref 4.22–5.81)
RDW: 12 % (ref 11.5–15.5)
WBC: 6.4 10*3/uL (ref 4.0–10.5)
nRBC: 0 % (ref 0.0–0.2)

## 2018-11-27 LAB — COMPREHENSIVE METABOLIC PANEL
ALT: 214 U/L — ABNORMAL HIGH (ref 0–44)
AST: 83 U/L — ABNORMAL HIGH (ref 15–41)
Albumin: 3.7 g/dL (ref 3.5–5.0)
Alkaline Phosphatase: 103 U/L (ref 38–126)
Anion gap: 6 (ref 5–15)
BUN: 11 mg/dL (ref 6–20)
CO2: 24 mmol/L (ref 22–32)
Calcium: 8.8 mg/dL — ABNORMAL LOW (ref 8.9–10.3)
Chloride: 107 mmol/L (ref 98–111)
Creatinine, Ser: 0.94 mg/dL (ref 0.61–1.24)
GFR calc Af Amer: >60 mL/min (ref >60)
GFR calc non Af Amer: >60 mL/min (ref >60)
Glucose, Bld: 94 mg/dL (ref 70–99)
Potassium: 3.6 mmol/L (ref 3.5–5.1)
Sodium: 137 mmol/L (ref 135–145)
Total Bilirubin: 1.1 mg/dL (ref 0.3–1.2)
Total Protein: 7 g/dL (ref 6.5–8.1)

## 2018-11-27 LAB — RAPID URINE DRUG SCREEN, HOSP PERFORMED
Amphetamines: NOT DETECTED
Barbiturates: NOT DETECTED
Benzodiazepines: NOT DETECTED
Cocaine: NOT DETECTED
Opiates: POSITIVE — AB
Tetrahydrocannabinol: POSITIVE — AB

## 2018-11-27 LAB — ETHANOL: Alcohol, Ethyl (B): <10 mg/dL (ref <10)

## 2018-11-27 LAB — ACETAMINOPHEN LEVEL: Acetaminophen (Tylenol), Serum: <10 ug/mL — ABNORMAL LOW (ref 10–30)

## 2018-11-27 LAB — CBG MONITORING, ED: Glucose-Capillary: 75 mg/dL (ref 70–99)

## 2018-11-27 LAB — SALICYLATE LEVEL: Salicylate Lvl: <7 mg/dL (ref 2.8–30.0)

## 2018-11-27 MED ORDER — SODIUM CHLORIDE (PF) 0.9 % IJ SOLN
INTRAMUSCULAR | Status: AC
  Filled 2018-11-27: qty 50

## 2018-11-27 MED ORDER — IOHEXOL 300 MG/ML  SOLN
75.0000 mL | Freq: Once | INTRAMUSCULAR | Status: AC | PRN
  Administered 2018-11-27: 20:00:00 75 mL via INTRAVENOUS

## 2018-11-27 MED ORDER — SODIUM CHLORIDE 0.9 % IV BOLUS
1000.0000 mL | Freq: Once | INTRAVENOUS | Status: AC
  Administered 2018-11-27: 1000 mL via INTRAVENOUS

## 2018-11-27 NOTE — ED Provider Notes (Signed)
Assumed care from [place] at shift change.  See prior notes for full H&P.  Briefly, 38 y.o. M with history of HIV here after witnessed seizure at home after doing IV heroin.  Apparently he does this frequently but has never had a seizure.  Work-up thus far has been reassuring.  No fevers.  Last CD4 count was 450 and viral load was undetectable 08/21/18.  CT head w/ and w/out contrast obtained and is reassuring.  Plan:  Awaiting UA and can likely discharge.  Results for orders placed or performed during the hospital encounter of 11/27/18  CBC WITH DIFFERENTIAL  Result Value Ref Range   WBC 6.4 4.0 - 10.5 K/uL   RBC 4.06 (L) 4.22 - 5.81 MIL/uL   Hemoglobin 13.2 13.0 - 17.0 g/dL   HCT 40.940.2 81.139.0 - 91.452.0 %   MCV 99.0 80.0 - 100.0 fL   MCH 32.5 26.0 - 34.0 pg   MCHC 32.8 30.0 - 36.0 g/dL   RDW 78.212.0 95.611.5 - 21.315.5 %   Platelets 143 (L) 150 - 400 K/uL   nRBC 0.0 0.0 - 0.2 %   Neutrophils Relative % 61 %   Neutro Abs 4.0 1.7 - 7.7 K/uL   Lymphocytes Relative 26 %   Lymphs Abs 1.6 0.7 - 4.0 K/uL   Monocytes Relative 10 %   Monocytes Absolute 0.6 0.1 - 1.0 K/uL   Eosinophils Relative 3 %   Eosinophils Absolute 0.2 0.0 - 0.5 K/uL   Basophils Relative 0 %   Basophils Absolute 0.0 0.0 - 0.1 K/uL   Immature Granulocytes 0 %   Abs Immature Granulocytes 0.02 0.00 - 0.07 K/uL  Comprehensive metabolic panel  Result Value Ref Range   Sodium 137 135 - 145 mmol/L   Potassium 3.6 3.5 - 5.1 mmol/L   Chloride 107 98 - 111 mmol/L   CO2 24 22 - 32 mmol/L   Glucose, Bld 94 70 - 99 mg/dL   BUN 11 6 - 20 mg/dL   Creatinine, Ser 0.860.94 0.61 - 1.24 mg/dL   Calcium 8.8 (L) 8.9 - 10.3 mg/dL   Total Protein 7.0 6.5 - 8.1 g/dL   Albumin 3.7 3.5 - 5.0 g/dL   AST 83 (H) 15 - 41 U/L   ALT 214 (H) 0 - 44 U/L   Alkaline Phosphatase 103 38 - 126 U/L   Total Bilirubin 1.1 0.3 - 1.2 mg/dL   GFR calc non Af Amer >60 >60 mL/min   GFR calc Af Amer >60 >60 mL/min   Anion gap 6 5 - 15  Ethanol  Result Value Ref Range   Alcohol, Ethyl (B) <10 <10 mg/dL  Rapid urine drug screen (hospital performed)  Result Value Ref Range   Opiates POSITIVE (A) NONE DETECTED   Cocaine NONE DETECTED NONE DETECTED   Benzodiazepines NONE DETECTED NONE DETECTED   Amphetamines NONE DETECTED NONE DETECTED   Tetrahydrocannabinol POSITIVE (A) NONE DETECTED   Barbiturates NONE DETECTED NONE DETECTED  Acetaminophen level  Result Value Ref Range   Acetaminophen (Tylenol), Serum <10 (L) 10 - 30 ug/mL  Salicylate level  Result Value Ref Range   Salicylate Lvl <7.0 2.8 - 30.0 mg/dL  Urinalysis, Routine w reflex microscopic  Result Value Ref Range   Color, Urine AMBER (A) YELLOW   APPearance CLEAR CLEAR   Specific Gravity, Urine 1.038 (H) 1.005 - 1.030   pH 5.0 5.0 - 8.0   Glucose, UA NEGATIVE NEGATIVE mg/dL   Hgb urine dipstick NEGATIVE NEGATIVE  Bilirubin Urine NEGATIVE NEGATIVE   Ketones, ur NEGATIVE NEGATIVE mg/dL   Protein, ur NEGATIVE NEGATIVE mg/dL   Nitrite NEGATIVE NEGATIVE   Leukocytes,Ua NEGATIVE NEGATIVE  CBG monitoring, ED  Result Value Ref Range   Glucose-Capillary 75 70 - 99 mg/dL   Ct Head W Or Wo Contrast  Result Date: 11/27/2018 CLINICAL DATA:  New onset seizure EXAM: CT HEAD WITHOUT AND WITH CONTRAST TECHNIQUE: Contiguous axial images were obtained from the base of the skull through the vertex without and with intravenous contrast CONTRAST:  74mL OMNIPAQUE IOHEXOL 300 MG/ML  SOLN COMPARISON:  Head CT 06/27/2015 FINDINGS: Brain: There is no mass, hemorrhage or extra-axial collection. The size and configuration of the ventricles and extra-axial CSF spaces are normal. The brain parenchyma is normal, without acute or chronic infarction. No abnormal enhancement. Vascular: No abnormal hyperdensity of the major intracranial arteries or dural venous sinuses. No intracranial atherosclerosis. Skull: The visualized skull base, calvarium and extracranial soft tissues are normal. Sinuses/Orbits: No fluid levels or advanced  mucosal thickening of the visualized paranasal sinuses. No mastoid or middle ear effusion. The orbits are normal. IMPRESSION: Normal head CT with and without contrast Electronically Signed   By: Deatra Robinson M.D.   On: 11/27/2018 20:43   UA negative.  UDs + for opiates and THC.  Patient has remained seizure free here in the ED.  His vitals are stable.  He has not fever or other meningeal signs on exam.  Seizure likely from drug use.  I have given him driving restrictions until cleared by neurology.  He can return here for any new/acute changes.   Garlon Hatchet, PA-C 11/28/18 Evonnie Pat, MD 11/28/18 [phone]

## 2018-11-27 NOTE — ED Triage Notes (Signed)
Patient is was picked up from home and transported via [facility]. According to EMS, patient had a witnessed seizure by a friend that lasted around 2 minutes. Patient was post-itcal but coming around.

## 2018-11-27 NOTE — ED Provider Notes (Signed)
[facility]-EMERGENCY DEPT Provider Note   CSN: [id number] Arrival date & time: 11/27/18  1842    History   Chief Complaint Chief Complaint  Patient presents with  . Seizures    HPI Justin [name] is a 38 y.o. male with history of HIV, hepatitis, polysubstance abuse presents today following seizure-like activity.  Following history obtained directly from patient.  Patient reports that he was injecting heroin at approximately 5:30 PM today and then afterwards went to go lay down in his bed.  When patient awoke EMS was standing above him.  Patient reports that he has a mild generalized throbbing headache with no other complaints.  Patient was informed by his friend and EMS that he had a seizure, patient reports that he has never had a seizure in the past.  Patient reports that he was in his bed throughout the episode and denies any fall or injury.  Patient denies any history of fevers/chills, nausea/vomiting, diarrhea, abdominal pain, chest pain/shortness of breath, cough, neck pain, back pain or pain to the extremities.  He denies any vision changes numbness weakness or tingling.  Patient denies any other complaints.  Of note patient denies alcohol use states he has not drink any alcohol in the past several months denies history of alcohol withdrawal. - History obtained from triage note in accordance with patient history. - No mention by triage note of any Narcan being administered by EMS.  Patient denies any medications by EMS prior to arrival.  EMS has left prior to my initial evaluation.    HPI  Past Medical History:  Diagnosis Date  . Endocarditis   . History of syphilis 08/25/2014   Treated by [facility] 07-2014.  [name] HIV (human immunodeficiency virus infection) [facility])     Patient Active Problem List   Diagnosis Date Noted  . Chronic hepatitis C without hepatic coma (HCC) 08/17/2017  . Screening examination for venereal disease 05/03/2017  . Encounter for  long-term (current) use of high-risk medication 05/03/2017  . Anxiety 09/26/2016  . Substance abuse (HCC) 09/26/2016  . Human immunodeficiency virus (HIV) disease (HCC) 09/02/2014  . History of syphilis 08/25/2014    Past Surgical History:  Procedure Laterality Date  . hand surgery Right         Home Medications    Prior to Admission medications   Medication Sig Start Date End Date Taking? Authorizing Provider  BIKTARVY 50-200-25 MG TABS tablet TAKE 1 TABLET BY MOUTH DAILY 10/19/18   Comer, Belia Heman, MD  Glecaprevir-Pibrentasvir (MAVYRET) 100-40 MG TABS Take 3 tablets by mouth daily. Patient not taking: Reported on 03/13/2018 09/26/17   [name], MD    Family History Family History  Adopted: Yes  Family history unknown: Yes    Social History Social History   Tobacco Use  . Smoking status: Current Every Day Smoker    Types: Cigarettes  . Smokeless tobacco: Never Used  Substance Use Topics  . Alcohol use: Yes    Comment: 1-2 a week   . Drug use: Yes    Frequency: 14.0 times per week    Types: Marijuana, Methamphetamines    Comment: Heroine     Allergies   Patient has no known allergies.   Review of Systems Review of Systems  Constitutional: Negative.  Negative for chills and fever.  Eyes: Negative.  Negative for photophobia and visual disturbance.  Respiratory: Negative.  Negative for cough and shortness of breath.   Cardiovascular: Negative.  Negative for chest pain.  Gastrointestinal: Negative.  Negative for abdominal pain, diarrhea, nausea and vomiting.  Genitourinary: Negative.  Negative for dysuria and hematuria.  Musculoskeletal: Negative.  Negative for arthralgias, back pain, myalgias and neck pain.  Neurological: Positive for seizures (2 minutes of seizure-like activity witnessed by friend) and headaches (Mild generalized). Negative for dizziness, syncope and weakness.  All other systems reviewed and are negative.  Physical Exam Updated Vital  Signs BP 113/71   Pulse 79   Temp 98.7 F (37.1 C) (Oral)   Resp 12   Ht 6' (1.829 m)   Wt 79.4 kg   SpO2 96%   BMI 23.73 kg/m   Physical Exam Constitutional:      General: He is not in acute distress.    Appearance: Normal appearance. He is well-developed. He is not ill-appearing or diaphoretic.  HENT:     Head: Normocephalic and atraumatic.     Right Ear: External ear normal.     Left Ear: External ear normal.     Nose: Nose normal.  Eyes:     General: Vision grossly intact. Gaze aligned appropriately.     Pupils: Pupils are equal, round, and reactive to light.  Neck:     Musculoskeletal: Normal range of motion.     Trachea: Trachea and phonation normal. No tracheal deviation.  Cardiovascular:     Rate and Rhythm: Normal rate and regular rhythm.     Heart sounds: Normal heart sounds.  Pulmonary:     Effort: Pulmonary effort is normal. No respiratory distress.  Chest:     Chest wall: No tenderness or crepitus.  Abdominal:     General: There is no distension.     Palpations: Abdomen is soft.     Tenderness: There is no abdominal tenderness. There is no guarding or rebound.  Musculoskeletal: Normal range of motion.     Comments: No midline C/T/L spinal tenderness to palpation, no paraspinal muscle tenderness, no deformity, crepitus, or step-off noted. No sign of injury to the neck or back.  Skin:    General: Skin is warm and dry.  Neurological:     Mental Status: He is alert.     GCS: GCS eye subscore is 4. GCS verbal subscore is 5. GCS motor subscore is 6.     Comments: Speech is clear and goal oriented, follows commands Major Cranial nerves without deficit, no facial droop Moves extremities without ataxia, coordination intact  Psychiatric:        Behavior: Behavior normal.    ED Treatments / Results  Labs (all labs ordered are listed, but only abnormal results are displayed) Labs Reviewed  CBC WITH DIFFERENTIAL/PLATELET - Abnormal; Notable for the following  components:      Result Value   RBC 4.06 (*)    Platelets 143 (*)    All other components within normal limits  COMPREHENSIVE METABOLIC PANEL - Abnormal; Notable for the following components:   Calcium 8.8 (*)    AST 83 (*)    ALT 214 (*)    All other components within normal limits  ACETAMINOPHEN LEVEL - Abnormal; Notable for the following components:   Acetaminophen (Tylenol), Serum <10 (*)    All other components within normal limits  ETHANOL  SALICYLATE LEVEL  RAPID URINE DRUG SCREEN, HOSP PERFORMED  URINALYSIS, ROUTINE W REFLEX MICROSCOPIC  CBG MONITORING, ED  CBG MONITORING, ED    EKG EKG Interpretation  Date/Time:  Tuesday Nov 27 2018 19:02:32 EDT Ventricular Rate:  73 PR Interval:  QRS Duration: 91 QT Interval:  364 QTC Calculation: 402 R Axis:   9 Text Interpretation:  Sinus rhythm Confirmed by Loren Racer ([id number]) on 11/27/2018 7:27:43 PM   Radiology Ct Head W Or Wo Contrast  Result Date: 11/27/2018 CLINICAL DATA:  New onset seizure EXAM: CT HEAD WITHOUT AND WITH CONTRAST TECHNIQUE: Contiguous axial images were obtained from the base of the skull through the vertex without and with intravenous contrast CONTRAST:  75mL OMNIPAQUE IOHEXOL 300 MG/ML  SOLN COMPARISON:  Head CT 06/27/2015 FINDINGS: Brain: There is no mass, hemorrhage or extra-axial collection. The size and configuration of the ventricles and extra-axial CSF spaces are normal. The brain parenchyma is normal, without acute or chronic infarction. No abnormal enhancement. Vascular: No abnormal hyperdensity of the major intracranial arteries or dural venous sinuses. No intracranial atherosclerosis. Skull: The visualized skull base, calvarium and extracranial soft tissues are normal. Sinuses/Orbits: No fluid levels or advanced mucosal thickening of the visualized paranasal sinuses. No mastoid or middle ear effusion. The orbits are normal. IMPRESSION: Normal head CT with and without contrast Electronically  Signed   By: Deatra Robinson M.D.   On: 11/27/2018 20:43    Procedures Procedures (including critical care time)  Medications Ordered in ED Medications  sodium chloride (PF) 0.9 % injection (has no administration in time range)  sodium chloride 0.9 % bolus 1,000 mL (1,000 mLs Intravenous New Bag/Given (Non-Interop) 11/27/18 2043)  iohexol (OMNIPAQUE) 300 MG/ML solution 75 mL (75 mLs Intravenous Contrast Given 11/27/18 2021)     Initial Impression / Assessment and Plan / ED Course  I have reviewed the triage vital signs and the nursing notes.  Pertinent labs & imaging results that were available during my care of the patient were reviewed by me and considered in my medical decision making (see chart for details).    38 year old male with history of HIV, hepatitis, syphilis treated in 2016 presents today for witnessed seizure-like activity after using heroin.  He was lying in his bed when this occurred and denies any fall or injury.  Lab work and imaging ordered for new onset seizure versus heroin overdose, case discussed with Dr. Ranae Palms, will obtain CT head with/without contrast to evaluate for infection  CBC nonacute CMP with elevated AST/ALT, patient with history of hepatitis, he is having no abdominal pain, nausea or vomiting, suspect related to his history of hepatitis Tylenol level negative Ethanol level negative Salicylate level negative CBG 75 EKG:  Sinus rhythm Confirmed by Loren Racer  CT head with/without contrast:  IMPRESSION: Normal head CT with and without contrast   10:10 PM: Patient reassessed sleeping easily arousable to voice.  States that he is feeling well and has no complaints at this time he is requesting to sleep longer.  Vital signs stable, heart rate 80 bpm on monitor, SPO2 100% on room air.  He is aware of her need for urine sample and will states that he will continue trying.  Care handoff given to Sharilyn Sites, PA-C at shift change, plan of care this time  is to await UA/UDS anticipate discharge.   Note: Portions of this report may have been transcribed using voice recognition software. Every effort was made to ensure accuracy; however, inadvertent computerized transcription errors may still be present. Final Clinical Impressions(s) / ED Diagnoses   Final diagnoses:  None    ED Discharge Orders    None       Elizabeth Palau 11/27/18 2216    Loren Racer, MD 11/28/18  1649  

## 2018-11-27 NOTE — Discharge Instructions (Addendum)
Drugs are detrimental to your health, we recommend you stop using them immediately. Follow-up with neurology. No driving until cleared by neurology. Return here for any new/acute changes.

## 2018-11-27 NOTE — ED Notes (Signed)
Bed: WA01 Expected date:  Expected time:  Means of arrival:  Comments: EMS seizxures

## 2018-11-27 NOTE — ED Notes (Signed)
Pt informed that urine sample is needed. Urinal at bedside

## 2018-11-28 NOTE — ED Notes (Signed)
Patient refused to sign for his discharge instructions. Reviewed discharge instructions with patient and provided them to patient.

## 2019-01-10 ENCOUNTER — Telehealth: Payer: Self-pay | Admitting: *Deleted

## 2019-01-10 NOTE — Telephone Encounter (Signed)
Patient called for medication refill of Biktarvy.  He is overdue for a visit and labs. 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  He is currently couch surfing, does not have a phone of his own.  He needs to make work a priority so that he can get back on his feet.  RN offered an appointment with pharmacy/financial counseling that works with his work schedule. 7/16 1:45.  He accepted.  For the time being, he would like all calls to be made to his friend Marcene Brawn at [phone], leave a message for "Cowboy". Patient was also being treated for hepatitis C. Landis Gandy, RN

## 2019-01-17 ENCOUNTER — Other Ambulatory Visit: Payer: Self-pay

## 2019-01-17 ENCOUNTER — Ambulatory Visit (INDEPENDENT_AMBULATORY_CARE_PROVIDER_SITE_OTHER): Payer: Self-pay | Admitting: Pharmacist

## 2019-01-17 ENCOUNTER — Ambulatory Visit: Payer: Self-pay

## 2019-01-17 DIAGNOSIS — B20 Human immunodeficiency virus [HIV] disease: Secondary | ICD-10-CM

## 2019-01-17 DIAGNOSIS — Z113 Encounter for screening for infections with a predominantly sexual mode of transmission: Secondary | ICD-10-CM

## 2019-01-17 DIAGNOSIS — Z79899 Other long term (current) drug therapy: Secondary | ICD-10-CM

## 2019-01-17 DIAGNOSIS — B182 Chronic viral hepatitis C: Secondary | ICD-10-CM

## 2019-01-17 NOTE — Progress Notes (Signed)
HPI: Justin [name] is a 38 y.o. male who presents to the [facility] for HIV follow-up after several no shows with Dr. Linus Salmons.   Patient Active Problem List   Diagnosis Date Noted  . Chronic hepatitis C without hepatic coma ([place]) 08/17/2017  . Screening examination for venereal disease 05/03/2017  . Encounter for long-term (current) use of high-risk medication 05/03/2017  . Anxiety 09/26/2016  . Substance abuse ([place]) 09/26/2016  . Human immunodeficiency virus (HIV) disease ([place]) 09/02/2014  . History of syphilis 08/25/2014    Patient's Medications  New Prescriptions   No medications on file  Previous Medications   BIKTARVY 50-200-25 MG TABS TABLET    TAKE 1 TABLET BY MOUTH DAILY   GLECAPREVIR-PIBRENTASVIR (MAVYRET) 100-40 MG TABS    Take 3 tablets by mouth daily.  Modified Medications   No medications on file  Discontinued Medications   No medications on file    Allergies: No Known Allergies  Past Medical History: Past Medical History:  Diagnosis Date  . Endocarditis   . History of syphilis 08/25/2014   Treated by [facility] 07-2014.  [name] HIV (human immunodeficiency virus infection) ([place])     Social History: Social History   Socioeconomic History  . Marital status: Widowed    Spouse name: Not on file  . Number of children: Not on file  . Years of education: Not on file  . Highest education level: Not on file  Occupational History  . Not on file  Social Needs  . Financial resource strain: Not on file  . Food insecurity    Worry: Not on file    Inability: Not on file  . Transportation needs    Medical: Not on file    Non-medical: Not on file  Tobacco Use  . Smoking status: Current Every Day Smoker    Types: Cigarettes  . Smokeless tobacco: Never Used  Substance and Sexual Activity  . Alcohol use: Yes    Comment: 1-2 a week   . Drug use: Yes    Frequency: 14.0 times per week    Types: Marijuana, Methamphetamines    Comment: Heroine  . Sexual  activity: Yes    Partners: Male    Birth control/protection: Condom    Comment: given condoms  Lifestyle  . Physical activity    Days per week: Not on file    Minutes per session: Not on file  . Stress: Not on file  Relationships  . Social Herbalist on phone: Not on file    Gets together: Not on file    Attends religious service: Not on file    Active member of club or organization: Not on file    Attends meetings of clubs or organizations: Not on file    Relationship status: Not on file  Other Topics Concern  . Not on file  Social History Narrative  . Not on file    Labs: Lab Results  Component Value Date   HIV1RNAQUANT <20 DETECTED (A) 08/21/2018   HIV1RNAQUANT <20 DETECTED (A) 08/17/2017   HIV1RNAQUANT <20 NOT DETECTED 05/03/2017   HIV1RNAVL <40 07/01/2015   HIV1RNAVL <40 05/04/2015   HIV1RNAVL <40 03/10/2015   CD4TABS 450 08/21/2018   CD4TABS 660 08/17/2017   CD4TABS 540 05/03/2017    RPR and STI Lab Results  Component Value Date   LABRPR NON-REACTIVE 08/21/2018   LABRPR REACTIVE (A) 05/03/2017   LABRPR NON REAC 09/06/2016   LABRPR REACTIVE (A) 08/19/2014  LABRPR Reactive (A) 07/12/2014   RPRTITER 1:1 (H) 05/03/2017   RPRTITER 1:32 (A) 08/19/2014    STI Results GC GC CT CT  Latest Ref Rng & Units - NEGATIVE - NEGATIVE  05/03/2017 Negative - Negative -  09/06/2016 Negative - Negative -  08/19/2014 NG: Negative - CT: Negative -  07/12/2014 - NEGATIVE - NEGATIVE    Hepatitis B Lab Results  Component Value Date   HEPBSAB POS (A) 08/19/2014   HEPBSAG Negative 02/27/2016   HEPBCAB REACTIVE (A) 08/19/2014   Hepatitis C Lab Results  Component Value Date   HCVRNAPCRQN 80 (H) 08/21/2018   Hepatitis A Lab Results  Component Value Date   HAV NON-REACTIVE 08/17/2017   Lipids: Lab Results  Component Value Date   CHOL 164 08/21/2018   TRIG 58 08/21/2018   HDL 47 08/21/2018   CHOLHDL 3.5 08/21/2018   VLDL 13 09/25/2014   LDLCALC 103 (H)  08/21/2018    Current HIV Regimen: Biktarvy  Assessment: Justin [name] is here today to follow-up for his HIV infection.  He has no showed Dr. Luciana Axeomer several times and was last seen on 11/07/17.  He was doing well on Biktarvy at that time and had started taking Mavyret for 8 weeks for his chronic Hepatitis C.  He never followed up after that.  He had labs drawn back in February that showed an undetectable HIV viral load and a Hep C viral load of 80.  I asked him about his Mavyret and he stated that he thought he took all 2 months of the medication.  He did say he only took 2 pills once daily and Mavyret should have been 3 pills once daily with food.  He doesn't remember it coming in a box/package either, so I highly doubt that he took the whole regimen.  His Hep C viral load was low but detectable indicating that he either failed or did not complete treatment, but I will check another viral load today.  He states that he takes his Biktarvy every day and does not miss any doses.  He tolerates it very well with no side effects.  He does not take any other medications.  He was recently in the ED in May for having a seizure after heroin use. He states that he is doing well now and doesn't need anything from us.  I will check routine labs today and reschedule him with Dr. Luciana Axeomer for 6 weeks. I assume that his Hep C viral load will still be positive. He knows to call at least 24 hours in advance to cancel/reschedule in order to not be a "no show" for the clinic.  He did meet with Olegario MessierKathy today to renew his HMAP.  Plan: - Continue Biktarvy PO once daily - HIV RNA, Hep C RNA, CD4, urine cytology, RPR, CMET, CBC w diff, lipid panel today - F/u with Dr. Luciana Axeomer 8/26 at 145pm   L. , PharmD, BCIDP, AAHIVP, CPP Infectious Diseases Clinical Pharmacist [facility] 01/17/2019, 4:42 PM

## 2019-01-18 LAB — T-HELPER CELL (CD4) - (RCID CLINIC ONLY)
CD4 % Helper T Cell: 32 % — ABNORMAL LOW (ref 33–65)
CD4 T Cell Abs: 740 /uL (ref 400–1790)

## 2019-01-22 LAB — URINE CYTOLOGY ANCILLARY ONLY
Chlamydia: NEGATIVE
Neisseria Gonorrhea: NEGATIVE

## 2019-01-23 LAB — COMPREHENSIVE METABOLIC PANEL
AG Ratio: 1.4 (calc) (ref 1.0–2.5)
ALT: 25 U/L (ref 9–46)
AST: 26 U/L (ref 10–40)
Albumin: 4.1 g/dL (ref 3.6–5.1)
Alkaline phosphatase (APISO): 82 U/L (ref 36–130)
BUN: 9 mg/dL (ref 7–25)
CO2: 27 mmol/L (ref 20–32)
Calcium: 9.3 mg/dL (ref 8.6–10.3)
Chloride: 105 mmol/L (ref 98–110)
Creat: 1.19 mg/dL (ref 0.60–1.35)
Globulin: 3 g/dL (calc) (ref 1.9–3.7)
Glucose, Bld: 85 mg/dL (ref 65–99)
Potassium: 3.9 mmol/L (ref 3.5–5.3)
Sodium: 141 mmol/L (ref 135–146)
Total Bilirubin: 0.7 mg/dL (ref 0.2–1.2)
Total Protein: 7.1 g/dL (ref 6.1–8.1)

## 2019-01-23 LAB — CBC WITH DIFFERENTIAL/PLATELET
Absolute Monocytes: 372 cells/uL (ref 200–950)
Basophils Absolute: 31 cells/uL (ref 0–200)
Basophils Relative: 0.6 %
Eosinophils Absolute: 71 cells/uL (ref 15–500)
Eosinophils Relative: 1.4 %
HCT: 41 % (ref 38.5–50.0)
Hemoglobin: 14.1 g/dL (ref 13.2–17.1)
Lymphs Abs: 2555 cells/uL (ref 850–3900)
MCH: 32 pg (ref 27.0–33.0)
MCHC: 34.4 g/dL (ref 32.0–36.0)
MCV: 93.2 fL (ref 80.0–100.0)
MPV: 10.2 fL (ref 7.5–12.5)
Monocytes Relative: 7.3 %
Neutro Abs: 2071 cells/uL (ref 1500–7800)
Neutrophils Relative %: 40.6 %
Platelets: 175 10*3/uL (ref 140–400)
RBC: 4.4 10*6/uL (ref 4.20–5.80)
RDW: 11.2 % (ref 11.0–15.0)
Total Lymphocyte: 50.1 %
WBC: 5.1 10*3/uL (ref 3.8–10.8)

## 2019-01-23 LAB — RPR: RPR Ser Ql: REACTIVE — AB

## 2019-01-23 LAB — LIPID PANEL
Cholesterol: 139 mg/dL (ref <200)
HDL: 46 mg/dL (ref >=40)
LDL Cholesterol (Calc): 77 mg/dL (calc)
Non-HDL Cholesterol (Calc): 93 mg/dL (calc) (ref <130)
Total CHOL/HDL Ratio: 3 (calc) (ref <5.0)
Triglycerides: 75 mg/dL (ref <150)

## 2019-01-23 LAB — FLUORESCENT TREPONEMAL AB(FTA)-IGG-BLD: Fluorescent Treponemal ABS: REACTIVE — AB

## 2019-01-23 LAB — RPR TITER: RPR Titer: 1:1 {titer} — ABNORMAL HIGH

## 2019-01-23 LAB — HEPATITIS C RNA QUANTITATIVE
HCV Quantitative Log: 5.37 Log IU/mL — ABNORMAL HIGH
HCV RNA, PCR, QN: 237000 IU/mL — ABNORMAL HIGH

## 2019-01-23 LAB — HIV-1 RNA QUANT-NO REFLEX-BLD
HIV 1 RNA Quant: <20 copies/mL
HIV-1 RNA Quant, Log: <1.3 Log copies/mL

## 2019-01-23 NOTE — Progress Notes (Signed)
Attempted to call patient regarding RPR and Hepatitis C viral load results. No answer, no VM. He has an appointment in August with Dr. Linus Salmons.. hopefully he shows up.

## 2019-01-30 ENCOUNTER — Encounter: Payer: Self-pay | Admitting: Internal Medicine

## 2019-02-27 ENCOUNTER — Ambulatory Visit (INDEPENDENT_AMBULATORY_CARE_PROVIDER_SITE_OTHER): Payer: Self-pay | Admitting: Internal Medicine

## 2019-02-27 ENCOUNTER — Encounter: Payer: Self-pay | Admitting: Internal Medicine

## 2019-02-27 ENCOUNTER — Other Ambulatory Visit: Payer: Self-pay

## 2019-02-27 VITALS — BP 104/62 | HR 74 | Temp 98.1°F | Wt 180.0 lb

## 2019-02-27 DIAGNOSIS — Z23 Encounter for immunization: Secondary | ICD-10-CM

## 2019-02-27 DIAGNOSIS — B20 Human immunodeficiency virus [HIV] disease: Secondary | ICD-10-CM

## 2019-02-27 DIAGNOSIS — B182 Chronic viral hepatitis C: Secondary | ICD-10-CM

## 2019-02-27 DIAGNOSIS — F1910 Other psychoactive substance abuse, uncomplicated: Secondary | ICD-10-CM

## 2019-03-01 NOTE — Assessment & Plan Note (Signed)
Seems to be doing well at this time

## 2019-03-01 NOTE — Assessment & Plan Note (Addendum)
Relapsed.  I will now try Vosevi for 12 weeks once reconfirmed and genotype available.   He will return in 4 weeks.

## 2019-03-01 NOTE — Progress Notes (Signed)
   Subjective:    Patient ID: Justin [name], male    DOB: 01/17/1981, 38 y.o.   MRN: [id number]  HPI Here for follow up of HIV and chronic hepatitis C. He has multiple no shows but he saw pharmacy last month and has been on his [place] with no issues.  Labs with a CD4 of 740 and viral load undetectable. He completed treatment for hepatitis C last year but unfortunately the viral load is again positive.  He denies any drug use and he reports good compliance with the [place] which he took for 8 weeks. No new issues otherwise.      Review of Systems  Constitutional: Negative for fatigue and unexpected weight change.  Gastrointestinal: Negative for diarrhea.  Neurological: Negative for dizziness.       Objective:   Physical Exam Constitutional:      Appearance: Normal appearance.  Eyes:     General: No scleral icterus. Cardiovascular:     Rate and Rhythm: Normal rate and regular rhythm.     Heart sounds: No murmur.  Pulmonary:     Effort: Pulmonary effort is normal.  Skin:    Findings: No rash.  Neurological:     Mental Status: He is alert.  Psychiatric:        Mood and Affect: Mood normal.   SH: remains drug free        Assessment & Plan:

## 2019-03-01 NOTE — Assessment & Plan Note (Signed)
Doing well despite sporadic follow up  Will check his viral load again today and recheck in 6 months

## 2019-03-06 ENCOUNTER — Ambulatory Visit (HOSPITAL_COMMUNITY)
Admission: RE | Admit: 2019-03-06 | Discharge: 2019-03-06 | Disposition: A | Discharge status: Home / Self Care | Payer: PRIVATE HEALTH INSURANCE | Source: Ambulatory Visit | Attending: Internal Medicine | Admitting: Internal Medicine

## 2019-03-06 ENCOUNTER — Other Ambulatory Visit: Payer: Self-pay

## 2019-03-06 DIAGNOSIS — B20 Human immunodeficiency virus [HIV] disease: Secondary | ICD-10-CM | POA: Diagnosis present

## 2019-03-06 DIAGNOSIS — B182 Chronic viral hepatitis C: Secondary | ICD-10-CM | POA: Insufficient documentation

## 2019-03-08 ENCOUNTER — Telehealth: Payer: Self-pay | Admitting: Pharmacy Technician

## 2019-03-08 ENCOUNTER — Other Ambulatory Visit: Payer: Self-pay | Admitting: Internal Medicine

## 2019-03-08 DIAGNOSIS — B182 Chronic viral hepatitis C: Secondary | ICD-10-CM

## 2019-03-08 LAB — HIV-1 RNA QUANT-NO REFLEX-BLD
HIV 1 RNA Quant: <20 copies/mL
HIV-1 RNA Quant, Log: <1.3 Log copies/mL

## 2019-03-08 LAB — HEPATITIS C GENOTYPE

## 2019-03-08 MED ORDER — SOFOSBUVIR-VELPATASVIR 400-100 MG PO TABS: 1.0000 | ORAL_TABLET | Freq: Every day | ORAL | 2 refills | Status: DC

## 2019-03-08 NOTE — Telephone Encounter (Signed)
[facility] Patient Advocate Encounter  Application for Justin [name] was completed and faxed to [employer] [facility]. The patient is aware of the process and has asked that the medication be shipped to the clinic for pickup. He provided his new cell number of [phone]. He does not have transportation and will need to arrange a ride to have the medication picked up.

## 2019-03-13 NOTE — Telephone Encounter (Signed)
Patient has been accepted into the [employer] Patient Assistance Program to receive his medication at no charge for the 12 weeks. His enrollment period is 03/08/2019 to 05/31/2019. [facility] will reach out to set up the first shipment, he originally requested that the medication be shipped to clinic for ease of coordination. Tried to call the patient but had to leave a voicemail because he was at work. Patient stated he works ten hour shifts, will try again later this afternoon.

## 2019-03-18 ENCOUNTER — Other Ambulatory Visit: Payer: Self-pay | Admitting: Pharmacist

## 2019-03-18 DIAGNOSIS — B20 Human immunodeficiency virus [HIV] disease: Secondary | ICD-10-CM

## 2019-03-18 DIAGNOSIS — B182 Chronic viral hepatitis C: Secondary | ICD-10-CM

## 2019-03-18 MED ORDER — BIKTARVY 50-200-25 MG PO TABS: 1.0000 | ORAL_TABLET | Freq: Every day | ORAL | 1 refills | Status: DC

## 2019-03-18 NOTE — Telephone Encounter (Addendum)
[facility] Patient Advocate Encounter  [facility] left a voicemail on 09/11 to confirm shipment of the patient's medication. I spoke with them and arranged for the medication to be sent to the clinic. They transferred me to [place] to get the address changed, medication will arrive 03/20/2019. Patient will send his roommate Dorothea Ogle Call to pick up the medication.  Medication arrived today, Tyler's number is disconnected at the time. Called Toryn's number and spoke with him about hours available to pick up the medication. He will come this week after 4:00pm to pick up Epclusa.  Patient picked up first refill on 09/17 @ 16:47

## 2019-03-20 ENCOUNTER — Encounter: Payer: Self-pay | Admitting: Pharmacy Technician

## 2019-04-08 ENCOUNTER — Ambulatory Visit: Payer: Self-pay | Admitting: Internal Medicine

## 2019-04-15 NOTE — Telephone Encounter (Signed)
[facility] Patient Advocate Encounter  [facility] called to schedule patient's next shipment of Epclusa. Predicted to arrive 10/14. Called patient to let him know when to pick up the medication from the clinic.

## 2019-04-16 NOTE — Telephone Encounter (Addendum)
[facility] Patient Advocate Encounter  Medication has arrived to the clinic and patient has been made aware.   Medication was never picked up, multiple attempts were made to contact patient. Reached his roommate this morning and he said they would come to the clinic 10/21 to get his Epclusa and then [employer] to pick up [employer].

## 2019-04-17 ENCOUNTER — Ambulatory Visit: Payer: PRIVATE HEALTH INSURANCE | Admitting: Pharmacist

## 2019-04-22 ENCOUNTER — Telehealth: Payer: Self-pay | Admitting: Pharmacy Technician

## 2019-04-22 NOTE — Telephone Encounter (Signed)
[facility] Patient Advocate Encounter  Completed and sent [employer] Access application for [place] for this patient who is uninsured.    Patient is approved 04/22/2019 through 05/23/2019.  BIN      [id number] PCN    [id number] GRP    [id number] ID        [id number]  I have given this information to his pharmacy.  This will bridge him until HMAP is approved.   Venida Jarvis. Nadara Mustard [facility]ious [facility] Phone: [phone] Fax:  [phone]

## 2019-05-01 ENCOUNTER — Other Ambulatory Visit: Payer: Self-pay | Admitting: Pharmacist

## 2019-05-01 DIAGNOSIS — B20 Human immunodeficiency virus [HIV] disease: Secondary | ICD-10-CM

## 2019-05-06 ENCOUNTER — Telehealth: Payer: Self-pay | Admitting: Pharmacist

## 2019-05-06 NOTE — Telephone Encounter (Addendum)
Patient started 12 weeks of Epclusa on 9/17. He was supposed to come and pick up his 2nd month on 10/12 but never showed up to get it.  It was still in the drawer when I checked this morning.  He should have run out 2 weeks ago.

## 2019-05-06 NOTE — Telephone Encounter (Signed)
Update: patient is currently in jail

## 2019-05-06 NOTE — Telephone Encounter (Signed)
That explains it.  Thanks for following up.

## 2019-05-06 NOTE — Telephone Encounter (Addendum)
[facility] Patient Advocate Encounter  [facility] called to set up his final shipment of medication. They have it set to arrive the week of November 9th. When it arrives, we will call the patient to arrange for him or his roommate, Dorothea Ogle, to pick up the medication.  I spoke with Dorothea Ogle after speaking with [facility] and he informed me that the patient is currently in jail and to have him removed as a contact for assistance. I will contact [facility] and update them to pause his account before the nov 9th shipment.

## 2019-05-07 NOTE — Telephone Encounter (Signed)
No problem.

## 2019-05-28 ENCOUNTER — Other Ambulatory Visit: Payer: Self-pay | Admitting: Pharmacist

## 2019-05-28 DIAGNOSIS — B20 Human immunodeficiency virus [HIV] disease: Secondary | ICD-10-CM

## 2019-06-18 ENCOUNTER — Other Ambulatory Visit: Payer: Self-pay | Admitting: Internal Medicine

## 2019-06-18 DIAGNOSIS — B20 Human immunodeficiency virus [HIV] disease: Secondary | ICD-10-CM

## 2019-08-06 ENCOUNTER — Inpatient Hospital Stay (HOSPITAL_COMMUNITY): Payer: Self-pay

## 2019-08-06 ENCOUNTER — Ambulatory Visit (HOSPITAL_COMMUNITY): Payer: Self-pay

## 2019-08-06 ENCOUNTER — Emergency Department (HOSPITAL_COMMUNITY): Payer: Self-pay

## 2019-08-06 ENCOUNTER — Inpatient Hospital Stay (HOSPITAL_COMMUNITY)
Admission: EM | Admit: 2019-08-06 | Discharge: 2019-08-09 | DRG: 442 | Disposition: A | Discharge status: Home / Self Care | Payer: Self-pay | Attending: Student in an Organized Health Care Education/Training Program | Admitting: Student in an Organized Health Care Education/Training Program

## 2019-08-06 DIAGNOSIS — F410 Panic disorder [episodic paroxysmal anxiety] without agoraphobia: Secondary | ICD-10-CM | POA: Diagnosis present

## 2019-08-06 DIAGNOSIS — F17210 Nicotine dependence, cigarettes, uncomplicated: Secondary | ICD-10-CM

## 2019-08-06 DIAGNOSIS — E860 Dehydration: Secondary | ICD-10-CM | POA: Diagnosis present

## 2019-08-06 DIAGNOSIS — R7989 Other specified abnormal findings of blood chemistry: Secondary | ICD-10-CM

## 2019-08-06 DIAGNOSIS — Z20822 Contact with and (suspected) exposure to covid-19: Secondary | ICD-10-CM | POA: Diagnosis present

## 2019-08-06 DIAGNOSIS — F1010 Alcohol abuse, uncomplicated: Secondary | ICD-10-CM | POA: Diagnosis present

## 2019-08-06 DIAGNOSIS — Z841 Family history of disorders of kidney and ureter: Secondary | ICD-10-CM

## 2019-08-06 DIAGNOSIS — Z833 Family history of diabetes mellitus: Secondary | ICD-10-CM

## 2019-08-06 DIAGNOSIS — R809 Proteinuria, unspecified: Secondary | ICD-10-CM | POA: Diagnosis present

## 2019-08-06 DIAGNOSIS — E872 Acidosis: Secondary | ICD-10-CM

## 2019-08-06 DIAGNOSIS — N179 Acute kidney failure, unspecified: Secondary | ICD-10-CM | POA: Diagnosis present

## 2019-08-06 DIAGNOSIS — B182 Chronic viral hepatitis C: Secondary | ICD-10-CM

## 2019-08-06 DIAGNOSIS — G9340 Encephalopathy, unspecified: Secondary | ICD-10-CM | POA: Diagnosis present

## 2019-08-06 DIAGNOSIS — Z9114 Patient's other noncompliance with medication regimen: Secondary | ICD-10-CM

## 2019-08-06 DIAGNOSIS — F1290 Cannabis use, unspecified, uncomplicated: Secondary | ICD-10-CM | POA: Diagnosis present

## 2019-08-06 DIAGNOSIS — Z560 Unemployment, unspecified: Secondary | ICD-10-CM

## 2019-08-06 DIAGNOSIS — E876 Hypokalemia: Secondary | ICD-10-CM | POA: Diagnosis present

## 2019-08-06 DIAGNOSIS — R1011 Right upper quadrant pain: Secondary | ICD-10-CM

## 2019-08-06 DIAGNOSIS — F10929 Alcohol use, unspecified with intoxication, unspecified: Secondary | ICD-10-CM

## 2019-08-06 DIAGNOSIS — F1110 Opioid abuse, uncomplicated: Secondary | ICD-10-CM | POA: Diagnosis present

## 2019-08-06 DIAGNOSIS — F329 Major depressive disorder, single episode, unspecified: Secondary | ICD-10-CM | POA: Diagnosis present

## 2019-08-06 DIAGNOSIS — G40909 Epilepsy, unspecified, not intractable, without status epilepticus: Secondary | ICD-10-CM | POA: Diagnosis present

## 2019-08-06 DIAGNOSIS — Z818 Family history of other mental and behavioral disorders: Secondary | ICD-10-CM

## 2019-08-06 DIAGNOSIS — F1990 Other psychoactive substance use, unspecified, uncomplicated: Secondary | ICD-10-CM | POA: Diagnosis present

## 2019-08-06 DIAGNOSIS — K7290 Hepatic failure, unspecified without coma: Principal | ICD-10-CM | POA: Diagnosis present

## 2019-08-06 DIAGNOSIS — Z825 Family history of asthma and other chronic lower respiratory diseases: Secondary | ICD-10-CM

## 2019-08-06 DIAGNOSIS — G40919 Epilepsy, unspecified, intractable, without status epilepticus: Secondary | ICD-10-CM

## 2019-08-06 DIAGNOSIS — K7682 Hepatic encephalopathy: Secondary | ICD-10-CM

## 2019-08-06 DIAGNOSIS — Y904 Blood alcohol level of 80-99 mg/100 ml: Secondary | ICD-10-CM

## 2019-08-06 DIAGNOSIS — Z79899 Other long term (current) drug therapy: Secondary | ICD-10-CM

## 2019-08-06 DIAGNOSIS — R4182 Altered mental status, unspecified: Secondary | ICD-10-CM

## 2019-08-06 DIAGNOSIS — B20 Human immunodeficiency virus [HIV] disease: Secondary | ICD-10-CM | POA: Diagnosis present

## 2019-08-06 LAB — RAPID URINE DRUG SCREEN, HOSP PERFORMED
Amphetamines: POSITIVE — AB
Barbiturates: NOT DETECTED
Benzodiazepines: NOT DETECTED
Cocaine: NOT DETECTED
Opiates: NOT DETECTED
Tetrahydrocannabinol: POSITIVE — AB

## 2019-08-06 LAB — CBC
HCT: 44.6 % (ref 39.0–52.0)
Hemoglobin: 15 g/dL (ref 13.0–17.0)
MCH: 33.2 pg (ref 26.0–34.0)
MCHC: 33.6 g/dL (ref 30.0–36.0)
MCV: 98.7 fL (ref 80.0–100.0)
Platelets: 207 10*3/uL (ref 150–400)
RBC: 4.52 MIL/uL (ref 4.22–5.81)
RDW: 12.3 % (ref 11.5–15.5)
WBC: 9.8 10*3/uL (ref 4.0–10.5)
nRBC: 0 % (ref 0.0–0.2)

## 2019-08-06 LAB — OSMOLALITY: Osmolality: 295 mOsm/kg (ref 275–295)

## 2019-08-06 LAB — URINALYSIS, ROUTINE W REFLEX MICROSCOPIC
Bilirubin Urine: NEGATIVE
Glucose, UA: NEGATIVE mg/dL
Hgb urine dipstick: NEGATIVE
Ketones, ur: 80 mg/dL — AB
Leukocytes,Ua: NEGATIVE
Nitrite: NEGATIVE
Protein, ur: 100 mg/dL — AB
Specific Gravity, Urine: 1.021 (ref 1.005–1.030)
pH: 5 (ref 5.0–8.0)

## 2019-08-06 LAB — COMPREHENSIVE METABOLIC PANEL
ALT: 102 U/L — ABNORMAL HIGH (ref 0–44)
ALT: 100 U/L — ABNORMAL HIGH (ref 0–44)
AST: 113 U/L — ABNORMAL HIGH (ref 15–41)
AST: 107 U/L — ABNORMAL HIGH (ref 15–41)
Albumin: 4.6 g/dL (ref 3.5–5.0)
Albumin: 4.7 g/dL (ref 3.5–5.0)
Alkaline Phosphatase: 94 U/L (ref 38–126)
Alkaline Phosphatase: 91 U/L (ref 38–126)
Anion gap: 19 — ABNORMAL HIGH (ref 5–15)
Anion gap: 18 — ABNORMAL HIGH (ref 5–15)
BUN: 22 mg/dL — ABNORMAL HIGH (ref 6–20)
BUN: 18 mg/dL (ref 6–20)
CO2: 19 mmol/L — ABNORMAL LOW (ref 22–32)
CO2: 20 mmol/L — ABNORMAL LOW (ref 22–32)
Calcium: 9.1 mg/dL (ref 8.9–10.3)
Calcium: 9.2 mg/dL (ref 8.9–10.3)
Chloride: 97 mmol/L — ABNORMAL LOW (ref 98–111)
Chloride: 101 mmol/L (ref 98–111)
Creatinine, Ser: 1.96 mg/dL — ABNORMAL HIGH (ref 0.61–1.24)
Creatinine, Ser: 1.68 mg/dL — ABNORMAL HIGH (ref 0.61–1.24)
GFR calc Af Amer: 23 mL/min — ABNORMAL LOW (ref >60)
GFR calc Af Amer: 59 mL/min — ABNORMAL LOW (ref >60)
GFR calc non Af Amer: 20 mL/min — ABNORMAL LOW (ref >60)
GFR calc non Af Amer: 51 mL/min — ABNORMAL LOW (ref >60)
Glucose, Bld: 95 mg/dL (ref 70–99)
Glucose, Bld: 90 mg/dL (ref 70–99)
Potassium: 3.5 mmol/L (ref 3.5–5.1)
Potassium: 4.3 mmol/L (ref 3.5–5.1)
Sodium: 135 mmol/L (ref 135–145)
Sodium: 139 mmol/L (ref 135–145)
Total Bilirubin: 3.1 mg/dL — ABNORMAL HIGH (ref 0.3–1.2)
Total Bilirubin: 3.1 mg/dL — ABNORMAL HIGH (ref 0.3–1.2)
Total Protein: 8.1 g/dL (ref 6.5–8.1)
Total Protein: 8.1 g/dL (ref 6.5–8.1)

## 2019-08-06 LAB — CBC WITH DIFFERENTIAL/PLATELET
Abs Immature Granulocytes: 0.11 10*3/uL — ABNORMAL HIGH (ref 0.00–0.07)
Basophils Absolute: 0 10*3/uL (ref 0.0–0.1)
Basophils Relative: 0 %
Eosinophils Absolute: 0 10*3/uL (ref 0.0–0.5)
Eosinophils Relative: 0 %
HCT: 45.9 % (ref 39.0–52.0)
Hemoglobin: 15.3 g/dL (ref 13.0–17.0)
Immature Granulocytes: 2 %
Lymphocytes Relative: 16 %
Lymphs Abs: 0.8 10*3/uL (ref 0.7–4.0)
MCH: 32.8 pg (ref 26.0–34.0)
MCHC: 33.3 g/dL (ref 30.0–36.0)
MCV: 98.5 fL (ref 80.0–100.0)
Monocytes Absolute: 0.8 10*3/uL (ref 0.1–1.0)
Monocytes Relative: 15 %
Neutro Abs: 3.4 10*3/uL (ref 1.7–7.7)
Neutrophils Relative %: 67 %
Platelets: 193 10*3/uL (ref 150–400)
RBC: 4.66 MIL/uL (ref 4.22–5.81)
RDW: 12.3 % (ref 11.5–15.5)
WBC: 5.2 10*3/uL (ref 4.0–10.5)
nRBC: 0 % (ref 0.0–0.2)

## 2019-08-06 LAB — POCT I-STAT 7, (LYTES, BLD GAS, ICA,H+H)
Acid-base deficit: 7 mmol/L — ABNORMAL HIGH (ref 0.0–2.0)
Bicarbonate: 18.5 mmol/L — ABNORMAL LOW (ref 20.0–28.0)
Calcium, Ion: 1.19 mmol/L (ref 1.15–1.40)
HCT: 40 % (ref 39.0–52.0)
Hemoglobin: 13.6 g/dL (ref 13.0–17.0)
O2 Saturation: 95 %
Potassium: 4.3 mmol/L (ref 3.5–5.1)
Sodium: 137 mmol/L (ref 135–145)
TCO2: 20 mmol/L — ABNORMAL LOW (ref 22–32)
pCO2 arterial: 38 mmHg (ref 32.0–48.0)
pH, Arterial: 7.295 — ABNORMAL LOW (ref 7.350–7.450)
pO2, Arterial: 86 mmHg (ref 83.0–108.0)

## 2019-08-06 LAB — RESPIRATORY PANEL BY RT PCR (FLU A&B, COVID)
Influenza A by PCR: NEGATIVE
Influenza B by PCR: NEGATIVE
SARS Coronavirus 2 by RT PCR: NEGATIVE

## 2019-08-06 LAB — SODIUM, URINE, RANDOM: Sodium, Ur: 87 mmol/L

## 2019-08-06 LAB — HIV ANTIBODY (ROUTINE TESTING W REFLEX): HIV Screen 4th Generation wRfx: REACTIVE — AB

## 2019-08-06 LAB — ETHANOL: Alcohol, Ethyl (B): <10 mg/dL (ref <10)

## 2019-08-06 LAB — MAGNESIUM: Magnesium: 2.4 mg/dL (ref 1.7–2.4)

## 2019-08-06 LAB — CBG MONITORING, ED: Glucose-Capillary: 97 mg/dL (ref 70–99)

## 2019-08-06 LAB — PHOSPHORUS: Phosphorus: 3.1 mg/dL (ref 2.5–4.6)

## 2019-08-06 LAB — OSMOLALITY, URINE: Osmolality, Ur: 770 mOsm/kg (ref 300–900)

## 2019-08-06 LAB — AMMONIA: Ammonia: 85 umol/L — ABNORMAL HIGH (ref 9–35)

## 2019-08-06 MED ORDER — BICTEGRAVIR-EMTRICITAB-TENOFOV 50-200-25 MG PO TABS
1.0000 | ORAL_TABLET | Freq: Every day | ORAL | Status: DC
  Administered 2019-08-07 – 2019-08-09 (×3): 1 via ORAL
  Filled 2019-08-06 (×4): qty 1

## 2019-08-06 MED ORDER — SODIUM CHLORIDE 0.9 % IV BOLUS
1000.0000 mL | Freq: Once | INTRAVENOUS | Status: AC
  Administered 2019-08-06: 1000 mL via INTRAVENOUS

## 2019-08-06 MED ORDER — PROMETHAZINE HCL 25 MG PO TABS: 12.5000 mg | ORAL_TABLET | Freq: Four times a day (QID) | ORAL | Status: DC | PRN

## 2019-08-06 MED ORDER — ADULT MULTIVITAMIN W/MINERALS CH
1.0000 | ORAL_TABLET | Freq: Every day | ORAL | Status: DC
  Administered 2019-08-06 – 2019-08-09 (×4): 1 via ORAL
  Filled 2019-08-06 (×4): qty 1

## 2019-08-06 MED ORDER — LACTATED RINGERS IV SOLN: INTRAVENOUS | Status: DC

## 2019-08-06 MED ORDER — LEVETIRACETAM 500 MG PO TABS
500.0000 mg | ORAL_TABLET | Freq: Two times a day (BID) | ORAL | Status: DC
  Administered 2019-08-06 – 2019-08-09 (×6): 500 mg via ORAL
  Filled 2019-08-06 (×8): qty 1

## 2019-08-06 MED ORDER — CHLORDIAZEPOXIDE HCL 25 MG PO CAPS
25.0000 mg | ORAL_CAPSULE | Freq: Once | ORAL | Status: AC
  Administered 2019-08-06: 25 mg via ORAL
  Filled 2019-08-06: qty 1

## 2019-08-06 MED ORDER — SENNOSIDES-DOCUSATE SODIUM 8.6-50 MG PO TABS: 1.0000 | ORAL_TABLET | Freq: Every evening | ORAL | Status: DC | PRN

## 2019-08-06 MED ORDER — CHLORDIAZEPOXIDE HCL 25 MG PO CAPS
25.0000 mg | ORAL_CAPSULE | Freq: Once | ORAL | Status: AC
  Administered 2019-08-06: 18:00:00 25 mg via ORAL
  Filled 2019-08-06: qty 1

## 2019-08-06 MED ORDER — THIAMINE HCL 100 MG PO TABS
100.0000 mg | ORAL_TABLET | Freq: Every day | ORAL | Status: DC
  Administered 2019-08-06 – 2019-08-09 (×4): 100 mg via ORAL
  Filled 2019-08-06 (×4): qty 1

## 2019-08-06 MED ORDER — ACETAMINOPHEN 650 MG RE SUPP: 650.0000 mg | Freq: Four times a day (QID) | RECTAL | Status: DC | PRN

## 2019-08-06 MED ORDER — HEPARIN SODIUM (PORCINE) 5000 UNIT/ML IJ SOLN
5000.0000 [IU] | Freq: Three times a day (TID) | INTRAMUSCULAR | Status: DC
  Administered 2019-08-07 – 2019-08-09 (×7): 5000 [IU] via SUBCUTANEOUS
  Filled 2019-08-06 (×8): qty 1

## 2019-08-06 MED ORDER — LORAZEPAM 2 MG/ML IJ SOLN
1.0000 mg | INTRAMUSCULAR | Status: DC | PRN
  Filled 2019-08-06: qty 1

## 2019-08-06 MED ORDER — LACTULOSE 10 GM/15ML PO SOLN
10.0000 g | Freq: Once | ORAL | Status: AC
  Administered 2019-08-06: 15:00:00 10 g via ORAL
  Filled 2019-08-06 (×2): qty 15

## 2019-08-06 MED ORDER — ACETAMINOPHEN 325 MG PO TABS
650.0000 mg | ORAL_TABLET | Freq: Four times a day (QID) | ORAL | Status: DC | PRN
  Administered 2019-08-07 – 2019-08-09 (×5): 650 mg via ORAL
  Filled 2019-08-06 (×4): qty 2

## 2019-08-06 MED ORDER — LORAZEPAM 1 MG PO TABS
1.0000 mg | ORAL_TABLET | ORAL | Status: DC | PRN
  Administered 2019-08-06: 16:00:00 2 mg via ORAL
  Filled 2019-08-06: qty 2

## 2019-08-06 MED ORDER — FOLIC ACID 1 MG PO TABS
1.0000 mg | ORAL_TABLET | Freq: Every day | ORAL | Status: DC
  Administered 2019-08-06 – 2019-08-09 (×4): 1 mg via ORAL
  Filled 2019-08-06 (×4): qty 1

## 2019-08-06 NOTE — ED Notes (Signed)
Pt transported to CT ?

## 2019-08-06 NOTE — Progress Notes (Signed)
Arrived at room to do EEG.  Patient is refusing all testing and wants to call a cab to leave.  RN aware.

## 2019-08-06 NOTE — ED Triage Notes (Signed)
Per EMS- neighbor called EMS for pt, pt found sleeping outside. Altered, lethargic. Upon arrival no IV access. Pt can wake up to verbal/painful stimuli, reports ETOH denies drug use. 22G PIV placed to L hand. Warm blankets applied.

## 2019-08-06 NOTE — Progress Notes (Signed)
Pt arrived to room by W/C from ED. Pt is alert and verbal. Pt up pacing floor. Telemetry applied to patient but patient is refusing to keep the telemetry on. MD pagedHyacinth Meeker, MD from teaching services. Pt is up walking back and forth from his room to the desk. MD aware.

## 2019-08-06 NOTE — ED Notes (Signed)
Pt much more awake and responsive than when arrived. Still confused at times, speaking to people who are not in room at times but can be engaged and answer some questions appropriately.

## 2019-08-06 NOTE — H&P (Addendum)
Date: 08/06/2019               Patient Name:  Justin [name] MRN: [id number]  DOB: [birth year] Age / Sex: 39 y.o., male   PCP: Patient, No Pcp Per              Medical Service: Internal Medicine Teaching Service              Attending Physician: Dr. Oswaldo Done, Marquita Palms, *    First Contact: Bailey Mech, MS 4 Pager: [phone]  Second Contact: Dr. Lorenso Courier Pager: [phone]            After Hours (After 5p/  First Contact Pager: [phone]  weekends / holidays): Second Contact Pager: [phone]   Chief Complaint: hepatic encephalopathy  History of Present Illness:  [name] is a 39 yo male with pmh of chronic Hep C on Biktarvy, HIV, polysubstance use disorder, and hx of syphilis who presents today with altered mental status.  Patient is unaware of how he arrived at the hospital and does not recall events leading up to arrival at the ED.  Per EMS, a neighbor called EMS regarding the patient, stating he was found unconscious outside on the ground, able to awakened with verbal/stimuli.   Patient states he had a seizure yesterday, witnessed by his friend.  His friend has told him that during his seizure, he smacked his lips and his body shook.  He recalls refilling Keppra a couple days ago, but did not take a dose today.  Per patient, first seizure occurred less than one year ago, which is consistent with chart review.  Per chart review, in 11/2018 patient had a seizure after using IV heroine, then presented to the ED.   Patient also endorses hx of panic attacks, with last episode yesterday afternoon.  Patient had CP and SOB at that time, but denies currently.  Per patient, he was incarcerated for 91 days and was released yesterday 2/1.  He does not recall who picked him up once released, but remembers staying with his best friend, Rob yesterday.  He admits to drinking a 6 pack of beer, using IV heroine, meth, and marijuana yesterday.  Prior to incarceration, he drank an 18 pack of  beer daily.   He denies complicated alcohol withdrawal, stating his seizures have not been related to alcohol use.  Patient states he has a hx of STDs, including syphilis.  He is not currently sexually active -- last sexual partner was 3 years ago.    Patient denies nausea, vomiting, diarrhea, constipation.   Per Dr. Zachery Dauer' note 07/31/2019, pt was treated for sinus infection with abx on 1/25 (patient unsure type).  Patient states no longer taking abx.    Meds:  Biktarvy 50-200-25mg  daily Keppra 500mg  BID  Allergies: Allergies as of 08/06/2019   (Not on File)   PMH:  HIV disease, well-controlled on medications Seizure disorder Alcohol use disorder Opioid use disorder Syphilis  Family History:  -Mother - diabetes -Brother - bipolar DO -Brother - asthma -Maternal uncle - kidney failure  Social History:  Incarcerated for past 91 days and released yesterday 2/1.  Previously stayed with his mother in [place].  Patient endorses smoking cigarettes (ppd), drinking alcohol (18 beers daily), using marijuana, meth and IV heroine.  Last used all substances on 08/05/2019.   Patient is not currently sexually active.  Last sexual partner was 3 years ago.   Patient gave permission to call mother to gather collateral and  inform her of his hospitalization, but explicitly asked not to sure utox information with her.   Review of Systems: A complete ROS was negative except as per HPI.   Physical Exam: Blood pressure 93/68, pulse 92, temperature 98.3 F (36.8 C), temperature source Oral, resp. rate 16, SpO2 98 %.  Gen:  Drowsy-appearing male in NAD; tearful at times during the interview HEENT:  Normocephalic, atraumatic.  No tongue lesions.   Pulm:  CTAB, no wheezes or crackles CV:  RRR, no murmurs, rubs gallops Abd:  Soft, non-distended, non-tender Extremities:  No LE edema bilaterally Neuro:  A&Ox2.  Oriented to self and hospital, but not town or time-- thought it was April 2017.  CNII-XII  intact, no asterixis, sensation intact throughout facial, upper, and lower extremity, rapid alternating movements intact MSK: Bilateral upper and lower extremity 5/5, fine motor strength intact   Imaging/Studies:   US Abdomen RUQ: Negative [place].  No gallstones or wall thickening.  No liver lesions noted.  Portal vein is patent.    CT Head WO Contrast: Normal head CT.  No evidence of acute infarction, hemorrhage, hydrocephalus, extra-axial collection or mass lesion/mass effect.  Negative for fracture or focal lesion.  No hyperdense vessel or unexpected calcification.   Assessment & Plan by Problem: Principal Problem:   Acute encephalopathy Active Problems:   Substance use disorder   HIV disease (HCC)   Acute kidney injury (HCC)  #Encephalopathy:  Likely post-ictal state, though ddx also includes hepatic encephalopathy, HIV encephalitis, neurosyphilis, stroke, alcohol intoxication or alcohol withdrawal, or substance induced (heroin, meth,etoh).  Ammonia level 85.  Patient had reactive RPR and reactive fluorescent treponemal ab in July 2020, but RPR titer 1:1 so unlikely neurosyphilis.  Neuro exam and head CT were normal without signs of acute infarction, hemorrhage or injury. Patient denies hx of complicated alcohol withdrawal, but will start CIWA protocol and given librium at admission. Ethanol level < 10 on admission.  Utox was positive for amphetamines and marijuana, which is consistent with patient history.   -  Librium 25 mg once  -  CIWA protocol with sx triggered ativan -  Seizure disorder work-up, as below -  Monitor on telemetry -- though patient declined -  NPO until pass bedside swallow study -  Lactulose 10g oral, once  #Seizure Disorder:  Patient has seizure disorder with unknown etiology.  First seizure was in May 2020 after IV heroine use. He has been on Keppra on and off since that time; treatment was interrupted due to incarceration.  With recent alcohol and drug use, this is a  likely trigger for seizures.  Additionally, patient missed several doses of Keppra.   -  EEG -  Levetiracetam level -  Consider neurology c/s  #AKI:  Cr 1.96, BUN 22.  Baseline Cr ~1.  Likely pre-renal due to poor hydration.  UA with ketones and protein is consistent with this etiology.    -urine studies -F/u BMP -repeat ua   #Anion gap metabolic acidosis with appropriate respiratory compensation:  pH 7.2, bicarb 18.5.  AG 19.  Likely due to alcohol.  Will continue to monitor BMP.  #Chronic Hepatitis C, genotype 1a:  HVC RNA 61,100 on 1/27. Saw Dr. Staci Righter at [facility] in Aug 2020.   Per chart review, he was not able to pick up Epclusa in Oct 2020 due to incarceration (last picked up in Sept 2020).  S[facility] Samara Snide at [facility] on 1/27.  Of note, on 1/27 Hep B  core ab reactive, but negative hepatitis B surface antigen indicates resolved Hep B infection.   -  Advise outpatient f/u with Dr. Linus Salmons for continuation of Epclusa  #HIV:  Last seen by Dr. Sharen Hones at [facility] on 1/27. At that time, he viral load was undetectable and CD4>700.   Currently takes Bikyarvy once daily without side effects. -  Continue Biktarvy  #Hx of Syphilis:  Reactive RPR, reactive fluorescent treponemal ab, but RPR titier 1:1 in July 2020.  According to chart review, results were not shared with patient as he was unable to be reached by phone.  Chlamydia/gonorrhea testing negative on 1/27.    #Polysubstance use disorder  Alcohol use disorder:  Utox positive for me -  Librium once 08/05/18, as above -  CIWA, as above -  Folic acid 1 mg daily -  Thiamine 100 mg daily  #Recent incarceration:  For 90 days, released on 08/05/19.  On 1/27, quantiferon gold negative.    Dispo: Admit patient to Inpatient with expected length of stay greater than 2 midnights.   Johny Blamer, Medical Student 08/06/2019, 3:20 PM   Attestation for Student Documentation:  I personally was  present and performed or re-performed the history, physical exam and medical decision-making activities of this service and have verified that the service and findings are accurately documented in the student's note.  Lars Mage, MD 08/06/2019, 6:19 PM

## 2019-08-06 NOTE — ED Notes (Signed)
ED TO INPATIENT HANDOFF REPORT  ED Nurse Name and Phone #:  Anan Dapolito [phone]  S Name/Age/Gender Justin [name] 39 y.o. male Room/Bed: H021C/H021C  Code Status   Code Status: Full Code  Home/SNF/Other Home Patient oriented to: self Is this baseline? No   Triage Complete: Triage complete  Chief Complaint Acute encephalopathy [G93.40]  Triage Note Per EMS- neighbor called EMS for pt, pt found sleeping outside. Altered, lethargic. Upon arrival no IV access. Pt can wake up to verbal/painful stimuli, reports ETOH denies drug use. 22G PIV placed to L hand. Warm blankets applied.     Allergies Not on File  Level of Care/Admitting Diagnosis ED Disposition    ED Disposition Condition Comment   Admit  Hospital Area: [name] [[id number]]  Level of Care: Telemetry Medical [104]  Covid Evaluation: Asymptomatic Screening Protocol (No Symptoms)  Diagnosis: Acute encephalopathy [741287]  Admitting Physician: Tyson Alias [[id number]]  Attending Physician: Tyson Alias [[id number]]  Estimated length of stay: past midnight tomorrow  Certification:: I certify this patient will need inpatient services for at least 2 midnights       B Medical/Surgery History   A IV Location/Drains/Wounds Patient Lines/Drains/Airways Status   Active Line/Drains/Airways    Name:   Placement date:   Placement time:   Site:   Days:   Peripheral IV 08/06/19 Left Hand   08/06/19    0742    Hand   less than 1          Intake/Output Last 24 hours  Intake/Output Summary (Last 24 hours) at 08/06/2019 1519 Last data filed at 08/06/2019 1129 Gross per 24 hour  Intake 2000 ml  Output --  Net 2000 ml    Labs/Imaging Results for orders placed or performed during the hospital encounter of 08/06/19 (from the past 48 hour(s))  CBG monitoring, ED     Status: None   Collection Time: 08/06/19  7:48 AM  Result Value Ref Range   Glucose-Capillary 97 70 - 99 mg/dL  CBC with  Differential     Status: Abnormal   Collection Time: 08/06/19  7:57 AM  Result Value Ref Range   WBC 5.2 4.0 - 10.5 K/uL   RBC 4.66 4.22 - 5.81 MIL/uL   Hemoglobin 15.3 13.0 - 17.0 g/dL   HCT 83.6 62.9 - 47.6 %   MCV 98.5 80.0 - 100.0 fL   MCH 32.8 26.0 - 34.0 pg   MCHC 33.3 30.0 - 36.0 g/dL   RDW 54.6 50.3 - 54.6 %   Platelets 193 150 - 400 K/uL   nRBC 0.0 0.0 - 0.2 %   Neutrophils Relative % 67 %   Neutro Abs 3.4 1.7 - 7.7 K/uL   Lymphocytes Relative 16 %   Lymphs Abs 0.8 0.7 - 4.0 K/uL   Monocytes Relative 15 %   Monocytes Absolute 0.8 0.1 - 1.0 K/uL   Eosinophils Relative 0 %   Eosinophils Absolute 0.0 0.0 - 0.5 K/uL   Basophils Relative 0 %   Basophils Absolute 0.0 0.0 - 0.1 K/uL   Immature Granulocytes 2 %   Abs Immature Granulocytes 0.11 (H) 0.00 - 0.07 K/uL    Comment: Performed at [facility], [address], [place], [place] [address]  Comprehensive metabolic panel     Status: Abnormal   Collection Time: 08/06/19  7:57 AM  Result Value Ref Range   Sodium 135 135 - 145 mmol/L   Potassium 3.5 3.5 - 5.1 mmol/L  Chloride 97 (L) 98 - 111 mmol/L   CO2 19 (L) 22 - 32 mmol/L   Glucose, Bld 95 70 - 99 mg/dL   BUN 22 (H) 6 - 20 mg/dL    Comment: QA FLAGS AND/OR RANGES MODIFIED BY DEMOGRAPHIC UPDATE ON 02/02 AT 0956   Creatinine, Ser 1.96 (H) 0.61 - 1.24 mg/dL   Calcium 9.1 8.9 - 16.1 mg/dL   Total Protein 8.1 6.5 - 8.1 g/dL   Albumin 4.6 3.5 - 5.0 g/dL   AST 096 (H) 15 - 41 U/L   ALT 102 (H) 0 - 44 U/L   Alkaline Phosphatase 94 38 - 126 U/L   Total Bilirubin 3.1 (H) 0.3 - 1.2 mg/dL   GFR calc non Af Amer 20 (L) >60 mL/min   GFR calc Af Amer 23 (L) >60 mL/min   Anion gap 19 (H) 5 - 15    Comment: Performed at [facility], [address], [place], [place] [address]  Ethanol     Status: None   Collection Time: 08/06/19  7:57 AM  Result Value Ref Range   Alcohol, Ethyl (B) <10 <10 mg/dL    Comment: (NOTE) Lowest detectable limit for serum alcohol is 10  mg/dL. For medical purposes only. Performed at [facility], [address], [place], [place] [address]   Ammonia     Status: Abnormal   Collection Time: 08/06/19  9:48 AM  Result Value Ref Range   Ammonia 85 (H) 9 - 35 umol/L    Comment: Performed at [facility], [address] [place], [place] [address]  Rapid urine drug screen (hospital performed)     Status: Abnormal   Collection Time: 08/06/19  1:22 PM  Result Value Ref Range   Opiates NONE DETECTED NONE DETECTED   Cocaine NONE DETECTED NONE DETECTED   Benzodiazepines NONE DETECTED NONE DETECTED   Amphetamines POSITIVE (A) NONE DETECTED   Tetrahydrocannabinol POSITIVE (A) NONE DETECTED   Barbiturates NONE DETECTED NONE DETECTED    Comment: (NOTE) DRUG SCREEN FOR MEDICAL PURPOSES ONLY.  IF CONFIRMATION IS NEEDED FOR ANY PURPOSE, NOTIFY LAB WITHIN 5 DAYS. LOWEST DETECTABLE LIMITS FOR URINE DRUG SCREEN Drug Class                     Cutoff (ng/mL) Amphetamine and metabolites    1000 Barbiturate and metabolites    200 Benzodiazepine                 200 Tricyclics and metabolites     300 Opiates and metabolites        300 Cocaine and metabolites        300 THC                            50 Performed at [facility], [address] [place], [place] [address]   Urinalysis, Routine w reflex microscopic     Status: Abnormal   Collection Time: 08/06/19  1:22 PM  Result Value Ref Range   Color, Urine YELLOW YELLOW   APPearance CLEAR CLEAR   Specific Gravity, Urine 1.021 1.005 - 1.030   pH 5.0 5.0 - 8.0   Glucose, UA NEGATIVE NEGATIVE mg/dL   Hgb urine dipstick NEGATIVE NEGATIVE   Bilirubin Urine NEGATIVE NEGATIVE   Ketones, ur 80 (A) NEGATIVE mg/dL   Protein, ur 308 (A) NEGATIVE mg/dL   Nitrite NEGATIVE NEGATIVE  Leukocytes,Ua NEGATIVE NEGATIVE   RBC / HPF 0-5 0 - 5 RBC/hpf   WBC, UA 0-5 0 - 5 WBC/hpf   Bacteria, UA RARE (A) NONE SEEN   Mucus PRESENT    Hyaline Casts, UA PRESENT     Comment:  Performed at [facility], [place] [address] [place], [place] [address]  I-STAT 7, (LYTES, BLD GAS, ICA, H+H)     Status: Abnormal   Collection Time: 08/06/19  2:29 PM  Result Value Ref Range   pH, Arterial 7.295 (L) 7.350 - 7.450   pCO2 arterial 38.0 32.0 - 48.0 mmHg   pO2, Arterial 86.0 83.0 - 108.0 mmHg   Bicarbonate 18.5 (L) 20.0 - 28.0 mmol/L   TCO2 20 (L) 22 - 32 mmol/L   O2 Saturation 95.0 %   Acid-base deficit 7.0 (H) 0.0 - 2.0 mmol/L   Sodium 137 135 - 145 mmol/L   Potassium 4.3 3.5 - 5.1 mmol/L   Calcium, Ion 1.19 1.15 - 1.40 mmol/L   HCT 40.0 39.0 - 52.0 %   Hemoglobin 13.6 13.0 - 17.0 g/dL   Patient temperature HIDE    Collection site RADIAL, ALLEN'S TEST ACCEPTABLE    Drawn by RT    Sample type ARTERIAL    CT Head Wo Contrast  Result Date: 08/06/2019 CLINICAL DATA:  Altered mental status, lethargic, ETOH EXAM: CT HEAD WITHOUT CONTRAST TECHNIQUE: Contiguous axial images were obtained from the base of the skull through the vertex without intravenous contrast. COMPARISON:  11/27/2018 FINDINGS: Brain: No evidence of acute infarction, hemorrhage, hydrocephalus, extra-axial collection or mass lesion/mass effect. Vascular: No hyperdense vessel or unexpected calcification. Skull: Normal. Negative for fracture or focal lesion. Sinuses/Orbits: Partial opacification of the left maxillary sinus. Visualized paranasal sinuses and mastoid air cells are otherwise clear. Other: None. IMPRESSION: Normal head CT. Electronically Signed   By: Julian Hy M.D.   On: 08/06/2019 12:04   US Abdomen Limited RUQ  Result Date: 08/06/2019 CLINICAL DATA:  Right upper quadrant pain. EXAM: ULTRASOUND ABDOMEN LIMITED RIGHT UPPER QUADRANT COMPARISON:  Abdominal ultrasound 03/06/2019. FINDINGS: Gallbladder: No gallstones or wall thickening visualized. No sonographic Murphy sign noted by sonographer. Common bile duct: Diameter: 0.5 cm Liver: No focal lesion identified. Within normal limits in parenchymal  echogenicity. Portal vein is patent on color Doppler imaging with normal direction of blood flow towards the liver. Other: None. IMPRESSION: Negative exam. Electronically Signed   By: Inge Rise M.D.   On: 08/06/2019 11:25    Pending Labs Unresulted Labs (From admission, onward)    Start     Ordered   08/07/19 0500  Comprehensive metabolic panel  Tomorrow morning,   R     08/06/19 1442   08/07/19 0500  CBC  Tomorrow morning,   R     08/06/19 1442   08/06/19 1444  Comprehensive metabolic panel  Once,   STAT     08/06/19 1444   08/06/19 1444  Magnesium  Once,   STAT     08/06/19 1444   08/06/19 1444  Phosphorus  Once,   STAT     08/06/19 1444   08/06/19 1444  CBC  Once,   STAT     08/06/19 1444   08/06/19 1437  CBC  (heparin)  Once,   STAT    Comments: Baseline for heparin therapy IF NOT ALREADY DRAWN.  Notify MD if PLT < 100 K.    08/06/19 1442   08/06/19 1430  HIV Antibody (routine testing  w rflx)  (HIV Antibody (Routine testing w reflex) panel)  Once,   STAT     08/06/19 1442   08/06/19 1330  Sodium, urine, random  Once,   STAT     08/06/19 1330   08/06/19 1330  Osmolality, urine  Once,   STAT     08/06/19 1330   08/06/19 1330  Osmolality  Once,   STAT     08/06/19 1330   08/06/19 1325  Blood gas, arterial  ONCE - STAT,   R     08/06/19 1325   08/06/19 1230  Respiratory Panel by RT PCR (Flu A&B, Covid) - Nasopharyngeal Swab  (Tier 2 Respiratory Panel by RT PCR (Flu A&B, Covid) (TAT 2 hrs))  Once,   STAT    Question Answer Comment  Is this test for diagnosis or screening Screening   Symptomatic for COVID-19 as defined by CDC No   Hospitalized for COVID-19 No   Admitted to ICU for COVID-19 No   Previously tested for COVID-19 No   Resident in a congregate (group) care setting Unknown   Employed in healthcare setting Unknown      08/06/19 1229   08/06/19 0937  Urine culture  ONCE - STAT,   STAT     08/06/19 0936          Vitals/Pain Today's Vitals   08/06/19  1100 08/06/19 1115 08/06/19 1130 08/06/19 1333  BP: 118/61 102/68 102/71 93/68  Pulse:    92  Resp:    16  Temp:    98.3 F (36.8 C)  TempSrc:    Oral  SpO2:    98%    Isolation Precautions No active isolations  Medications Medications  lactulose (CHRONULAC) 10 GM/15ML solution 10 g (has no administration in time range)  heparin injection 5,000 Units (has no administration in time range)  acetaminophen (TYLENOL) tablet 650 mg (has no administration in time range)    Or  acetaminophen (TYLENOL) suppository 650 mg (has no administration in time range)  senna-docusate (Senokot-S) tablet 1 tablet (has no administration in time range)  promethazine (PHENERGAN) tablet 12.5 mg (has no administration in time range)  folic acid (FOLVITE) tablet 1 mg (has no administration in time range)  thiamine tablet 100 mg (has no administration in time range)  multivitamin with minerals tablet 1 tablet (has no administration in time range)  LORazepam (ATIVAN) tablet 1-4 mg (has no administration in time range)    Or  LORazepam (ATIVAN) injection 1-4 mg (has no administration in time range)  chlordiazePOXIDE (LIBRIUM) capsule 25 mg (has no administration in time range)  sodium chloride 0.9 % bolus 1,000 mL (0 mLs Intravenous Stopped 08/06/19 0923)  sodium chloride 0.9 % bolus 1,000 mL (0 mLs Intravenous Stopped 08/06/19 1129)    Mobility walks High fall risk   Focused Assessments Neuro Assessment Handoff:  Swallow screen pass? Yes          Neuro Assessment: Exceptions to WDL Neuro Checks:      Last Documented NIHSS Modified Score:   Has TPA been given? No If patient is a Neuro Trauma and patient is going to OR before floor call report to 4N Charge nurse: [phone] or [phone]     R Recommendations: See Admitting Provider Note  Report given to:   Additional Notes:

## 2019-08-06 NOTE — ED Notes (Signed)
Waiting on lactulose from pharmacy. Note send to pharm at 1437 by other RN

## 2019-08-06 NOTE — ED Provider Notes (Signed)
[name] EMERGENCY DEPARTMENT Provider Note   CSN: [id number] Arrival date & time: 08/06/19  0762     History Chief Complaint  Patient presents with  . Altered Mental Status    Justin [name] is a 39 y.o. male.  The history is provided by the patient and medical records. No language interpreter was used.  Altered Mental Status Presenting symptoms: no combativeness and no confusion   Presenting symptoms comment:  Somnolence  Severity:  Moderate Most recent episode:  Today Episode history:  Single Timing:  Constant Progression:  Improving Chronicity:  New Context: alcohol use   Associated symptoms: no abdominal pain, no agitation, no difficulty breathing, no fever, no hallucinations, no headaches, no light-headedness, no nausea, no palpitations, no rash, no seizures, no slurred speech and no vomiting        No past medical history on file.  There are no problems to display for this patient.    No family history on file.  Social History   Tobacco Use  . Smoking status: Not on file  Substance Use Topics  . Alcohol use: Not on file  . Drug use: Not on file    Home Medications Prior to Admission medications   Not on File    Allergies    Patient has no allergy information on record.  Review of Systems   Review of Systems  Constitutional: Positive for fatigue. Negative for chills, diaphoresis and fever.  HENT: Negative for congestion.   Eyes: Negative for visual disturbance.  Respiratory: Negative for cough, chest tightness, shortness of breath and wheezing.   Cardiovascular: Negative for chest pain, palpitations and leg swelling.  Gastrointestinal: Negative for abdominal pain, constipation, diarrhea, nausea and vomiting.  Genitourinary: Negative for dysuria and frequency.  Musculoskeletal: Negative for back pain, neck pain and neck stiffness.  Skin: Negative for rash and wound.  Neurological: Negative for seizures, light-headedness  and headaches.  Psychiatric/Behavioral: Negative for agitation, confusion and hallucinations. The patient is not nervous/anxious.   All other systems reviewed and are negative.   Physical Exam Updated Vital Signs BP 114/75   Pulse (!) 114   Temp (!) 97.1 F (36.2 C) (Tympanic)   Resp 14   SpO2 95%   Physical Exam Vitals and nursing note reviewed.  Constitutional:      General: He is not in acute distress.    Appearance: He is well-developed. He is not ill-appearing, toxic-appearing or diaphoretic.  HENT:     Head: Normocephalic and atraumatic.     Nose: No congestion or rhinorrhea.     Mouth/Throat:     Mouth: Mucous membranes are dry.     Pharynx: No oropharyngeal exudate or posterior oropharyngeal erythema.  Eyes:     Conjunctiva/sclera: Conjunctivae normal.     Pupils: Pupils are equal, round, and reactive to light.  Cardiovascular:     Rate and Rhythm: Regular rhythm. Tachycardia present.     Pulses: Normal pulses.     Heart sounds: No murmur.  Pulmonary:     Effort: Pulmonary effort is normal. No respiratory distress.     Breath sounds: Normal breath sounds.  Abdominal:     General: Abdomen is flat.     Palpations: Abdomen is soft.     Tenderness: There is no abdominal tenderness. There is no right CVA tenderness, left CVA tenderness, guarding or rebound.  Musculoskeletal:        General: No tenderness.     Cervical back: Neck supple. No tenderness.  Skin:    General: Skin is warm and dry.     Capillary Refill: Capillary refill takes less than 2 seconds.     Findings: No erythema.  Neurological:     General: No focal deficit present.     Mental Status: He is alert.     Cranial Nerves: No cranial nerve deficit.     Sensory: No sensory deficit.     Motor: No weakness.  Psychiatric:        Mood and Affect: Mood normal.     ED Results / Procedures / Treatments   Labs (all labs ordered are listed, but only abnormal results are displayed) Labs Reviewed  CBC  WITH DIFFERENTIAL/PLATELET - Abnormal; Notable for the following components:      Result Value   Abs Immature Granulocytes 0.11 (*)    All other components within normal limits  COMPREHENSIVE METABOLIC PANEL - Abnormal; Notable for the following components:   Chloride 97 (*)    CO2 19 (*)    BUN 22 (*)    Creatinine, Ser 1.96 (*)    AST 113 (*)    ALT 102 (*)    Total Bilirubin 3.1 (*)    GFR calc non Af Amer 20 (*)    GFR calc Af Amer 23 (*)    Anion gap 19 (*)    All other components within normal limits  AMMONIA - Abnormal; Notable for the following components:   Ammonia 85 (*)    All other components within normal limits  RAPID URINE DRUG SCREEN, HOSP PERFORMED - Abnormal; Notable for the following components:   Amphetamines POSITIVE (*)    Tetrahydrocannabinol POSITIVE (*)    All other components within normal limits  URINALYSIS, ROUTINE W REFLEX MICROSCOPIC - Abnormal; Notable for the following components:   Ketones, ur 80 (*)    Protein, ur 100 (*)    Bacteria, UA RARE (*)    All other components within normal limits  POCT I-STAT 7, (LYTES, BLD GAS, ICA,H+H) - Abnormal; Notable for the following components:   pH, Arterial 7.295 (*)    Bicarbonate 18.5 (*)    TCO2 20 (*)    Acid-base deficit 7.0 (*)    All other components within normal limits  RESPIRATORY PANEL BY RT PCR (FLU A&B, COVID)  URINE CULTURE  ETHANOL  SODIUM, URINE, RANDOM  OSMOLALITY, URINE  BLOOD GAS, ARTERIAL  OSMOLALITY  HIV ANTIBODY (ROUTINE TESTING W REFLEX)  CBC  COMPREHENSIVE METABOLIC PANEL  MAGNESIUM  PHOSPHORUS  CBC  CBG MONITORING, ED    EKG None  Radiology DG Chest 2 View  Result Date: 08/06/2019 CLINICAL DATA:  39 year old male with chest pain. EXAM: CHEST - 2 VIEW COMPARISON:  Chest radiograph dated 06/27/2015. FINDINGS: The heart size and mediastinal contours are within normal limits. Both lungs are clear. The visualized skeletal structures are unremarkable. IMPRESSION: No  active cardiopulmonary disease. Electronically Signed   By: Elgie Collard M.D.   On: 08/06/2019 15:17   CT Head Wo Contrast  Result Date: 08/06/2019 CLINICAL DATA:  Altered mental status, lethargic, ETOH EXAM: CT HEAD WITHOUT CONTRAST TECHNIQUE: Contiguous axial images were obtained from the base of the skull through the vertex without intravenous contrast. COMPARISON:  11/27/2018 FINDINGS: Brain: No evidence of acute infarction, hemorrhage, hydrocephalus, extra-axial collection or mass lesion/mass effect. Vascular: No hyperdense vessel or unexpected calcification. Skull: Normal. Negative for fracture or focal lesion. Sinuses/Orbits: Partial opacification of the left maxillary sinus. Visualized paranasal sinuses and mastoid air  cells are otherwise clear. Other: None. IMPRESSION: Normal head CT. Electronically Signed   By: Julian Hy M.D.   On: 08/06/2019 12:04   US Abdomen Limited RUQ  Result Date: 08/06/2019 CLINICAL DATA:  Right upper quadrant pain. EXAM: ULTRASOUND ABDOMEN LIMITED RIGHT UPPER QUADRANT COMPARISON:  Abdominal ultrasound 03/06/2019. FINDINGS: Gallbladder: No gallstones or wall thickening visualized. No sonographic Murphy sign noted by sonographer. Common bile duct: Diameter: 0.5 cm Liver: No focal lesion identified. Within normal limits in parenchymal echogenicity. Portal vein is patent on color Doppler imaging with normal direction of blood flow towards the liver. Other: None. IMPRESSION: Negative exam. Electronically Signed   By: Inge Rise M.D.   On: 08/06/2019 11:25    Procedures Procedures (including critical care time)  Medications Ordered in ED Medications  heparin injection 5,000 Units (has no administration in time range)  acetaminophen (TYLENOL) tablet 650 mg (has no administration in time range)    Or  acetaminophen (TYLENOL) suppository 650 mg (has no administration in time range)  senna-docusate (Senokot-S) tablet 1 tablet (has no administration in time  range)  promethazine (PHENERGAN) tablet 12.5 mg (has no administration in time range)  folic acid (FOLVITE) tablet 1 mg (1 mg Oral Given 08/06/19 1536)  thiamine tablet 100 mg (100 mg Oral Given 08/06/19 1536)  multivitamin with minerals tablet 1 tablet (1 tablet Oral Given 08/06/19 1536)  LORazepam (ATIVAN) tablet 1-4 mg (2 mg Oral Given 08/06/19 1537)    Or  LORazepam (ATIVAN) injection 1-4 mg ( Intravenous See Alternative 08/06/19 1537)  sodium chloride 0.9 % bolus 1,000 mL (0 mLs Intravenous Stopped 08/06/19 0923)  sodium chloride 0.9 % bolus 1,000 mL (0 mLs Intravenous Stopped 08/06/19 1129)  lactulose (CHRONULAC) 10 GM/15ML solution 10 g (10 g Oral Given 08/06/19 1529)  chlordiazePOXIDE (LIBRIUM) capsule 25 mg (25 mg Oral Given 08/06/19 1537)    ED Course  I have reviewed the triage vital signs and the nursing notes.  Pertinent labs & imaging results that were available during my care of the patient were reviewed by me and considered in my medical decision making (see chart for details).    MDM Rules/Calculators/A&P                      [name] Doe is a 39 y.o. male with an unknown past medical history who presents for altered mental status.  According to law enforcement and EMS, patient was found lying down outside this morning.  Patient initially was somnolent and not arousable other than to pain but during transport and on arrival to the emergency department he is now waking up answering some questions.  He does report he drank some alcohol last night but denies any drug or medication use.  He denies any pain at this time.  He denies any nausea or vomiting.  Denies any recent fevers or chills.  Denies other complaints.  Denies any medical history.  Glucose on arrival was normal.  On exam, lungs clear and chest is nontender.  Abdomen is nontender.  Patient moving all extremities.  Patient is somnolent and drowsy but otherwise does not appear to have focal deficits.  Symmetric smile.  Pupils  are symmetric and reactive with normal extraocular movements.  His mucous membranes do appear dry suggestive of dehydration.  Skin is dry.  Suspect alcohol intoxication contributing to the altered mental status.  We will give him some fluids and allow him to metabolize.  Due to the somnolence, we  will get some screening labs and alcohol level on him at this time.  If he does not have any further abnormalities and is feeling better, anticipate discharge home.     9:41 AM Labs have began to return and there are some abnormalities.  Liver function is elevated as is bilirubin.  Anion gap is elevated but his glucose is normal, doubt DKA.  CBC reassuring.  Alcohol is negative.  Unclear etiology of symptoms however, patient now reports he has had seizures before and takes Keppra.  He also reports that he has having some right upper quadrant upper abdominal pain and tenderness.  Will get right upper quadrant ultrasound to rule out acute cholecystitis and he will still give fluids due to the elevated creatinine.  I do suspect he may have had a seizure and is acting postictal as this gradually improves.  On reassessment, he denies fevers, chills, neck pain, neck stiffness.  Does have a mild headache.  Will get CT head as well given the altered mental status.  Suspect postictal state.  11:53 AM Labs have begun to return.  Patient's ammonia is elevated at 85.  Suspect a degree of hepatic encephalopathy.  Right upper quadrant ultrasound was negative.  She is still awaiting CT head.  Clinically I now suspect that he is having hepatic encephalopathy contributing to altered mental status.  I also suspect he may have had a seizure with postictal state.  After head CT is completed, he will be admitted for the confusion and hepatic encephalopathy.  Chart review was able to be performed and it appears that patient actually has HIV as well as prior hep C.  It also appears that he has previously had syphilis and chlamydia  for which she was treated for.  Given the ammonia being elevated I suspect the symptoms are more related to hepatic encephalopathy as opposed to a new diagnosis of neurosyphilis.  He denies any focal neurologic deficits on my exam but was somewhat confused.  We will see what CT head shows.   Anticipate admission.  CT head is unremarkable.  Chest x-ray shows no pneumonia.  Due to the hepatic encephalopathy and altered mental status, he will be admitted.  Patient admitted for further management.  Final Clinical Impression(s) / ED Diagnoses Final diagnoses:  RUQ abdominal pain  Altered mental status, unspecified altered mental status type  Hepatic encephalopathy (HCC)  Increased ammonia level    Clinical Impression: 1. Altered mental status, unspecified altered mental status type   2. RUQ abdominal pain   3. Hepatic encephalopathy (HCC)   4. Increased ammonia level     Disposition: Admit  This note was prepared with assistance of Dragon voice recognition software. Occasional wrong-word or sound-a-like substitutions may have occurred due to the inherent limitations of voice recognition software.     Neidy Guerrieri, Canary Brim, MD 08/06/19 1600

## 2019-08-07 DIAGNOSIS — Z87438 Personal history of other diseases of male genital organs: Secondary | ICD-10-CM

## 2019-08-07 DIAGNOSIS — Z7289 Other problems related to lifestyle: Secondary | ICD-10-CM

## 2019-08-07 DIAGNOSIS — F1990 Other psychoactive substance use, unspecified, uncomplicated: Secondary | ICD-10-CM | POA: Diagnosis present

## 2019-08-07 DIAGNOSIS — N179 Acute kidney failure, unspecified: Secondary | ICD-10-CM | POA: Diagnosis present

## 2019-08-07 DIAGNOSIS — B20 Human immunodeficiency virus [HIV] disease: Secondary | ICD-10-CM | POA: Diagnosis present

## 2019-08-07 DIAGNOSIS — E876 Hypokalemia: Secondary | ICD-10-CM

## 2019-08-07 LAB — COMPREHENSIVE METABOLIC PANEL
ALT: 73 U/L — ABNORMAL HIGH (ref 0–44)
AST: 81 U/L — ABNORMAL HIGH (ref 15–41)
Albumin: 3.7 g/dL (ref 3.5–5.0)
Alkaline Phosphatase: 62 U/L (ref 38–126)
Anion gap: 14 (ref 5–15)
BUN: 11 mg/dL (ref 6–20)
CO2: 23 mmol/L (ref 22–32)
Calcium: 8.8 mg/dL — ABNORMAL LOW (ref 8.9–10.3)
Chloride: 104 mmol/L (ref 98–111)
Creatinine, Ser: 1.11 mg/dL (ref 0.61–1.24)
GFR calc Af Amer: >60 mL/min (ref >60)
GFR calc non Af Amer: >60 mL/min (ref >60)
Glucose, Bld: 81 mg/dL (ref 70–99)
Potassium: 3.3 mmol/L — ABNORMAL LOW (ref 3.5–5.1)
Sodium: 141 mmol/L (ref 135–145)
Total Bilirubin: 2.5 mg/dL — ABNORMAL HIGH (ref 0.3–1.2)
Total Protein: 6.4 g/dL — ABNORMAL LOW (ref 6.5–8.1)

## 2019-08-07 LAB — CBC
HCT: 38.5 % — ABNORMAL LOW (ref 39.0–52.0)
Hemoglobin: 12.9 g/dL — ABNORMAL LOW (ref 13.0–17.0)
MCH: 32.7 pg (ref 26.0–34.0)
MCHC: 33.5 g/dL (ref 30.0–36.0)
MCV: 97.7 fL (ref 80.0–100.0)
Platelets: 168 10*3/uL (ref 150–400)
RBC: 3.94 MIL/uL — ABNORMAL LOW (ref 4.22–5.81)
RDW: 12.4 % (ref 11.5–15.5)
WBC: 6.2 10*3/uL (ref 4.0–10.5)
nRBC: 0 % (ref 0.0–0.2)

## 2019-08-07 LAB — URINE CULTURE: Culture: NO GROWTH

## 2019-08-07 MED ORDER — POTASSIUM CHLORIDE 20 MEQ/15ML (10%) PO SOLN
40.0000 meq | Freq: Once | ORAL | Status: AC
  Administered 2019-08-07: 15:00:00 40 meq via ORAL
  Filled 2019-08-07: qty 30

## 2019-08-07 MED ORDER — POTASSIUM CHLORIDE 20 MEQ/15ML (10%) PO SOLN
40.0000 meq | Freq: Once | ORAL | Status: AC
  Administered 2019-08-07: 08:00:00 40 meq via ORAL
  Filled 2019-08-07: qty 30

## 2019-08-07 NOTE — Progress Notes (Signed)
Subjective:  HPI: [name] is a 39 yo male with pmh of chronic Hep C, HIV on Biktarvy, polysubstance use disorder, and hx of syphilis who presents today with   Overnight:  Patient was agitated yesterday evening and initially refused labs and EEG.  Patient did eventually agree to the labs, but did not agree to the EEG.    This morning:  Patient remained confused, stating he went to his mother's house house this morning.  Patient is oriented to self and place, but not time -- he believes it is April 2017 (same as yesterday).   He endorses some neck stiffness and nausea. He denies acute changes in vision.    Objective:  Vital signs in last 24 hours: Vitals:   08/07/19 0055 08/07/19 0333 08/07/19 0649 08/07/19 0815  BP: 110/68 118/76  114/77  Pulse: (!) 101 91  88  Resp: 20   20  Temp: 98.1 F (36.7 C) 97.9 F (36.6 C)  98.3 F (36.8 C)  TempSrc: Oral Oral  Oral  SpO2: 96% 97%  100%  Weight:   87.4 kg   Height:       Weight change:   Intake/Output Summary (Last 24 hours) at 08/07/2019 0818 Last data filed at 08/06/2019 1129 Gross per 24 hour  Intake 2000 ml  Output -  Net 2000 ml   Physical Exam: Gen: Tired-appearing male in NAD HEENT:Normocephalic, atraumatic.  Pulm: CTAB, no wheezes or crackles CV: RRR, no murmurs, rubs gallops Abd: Soft, non-distended, non-tender Extremities: No LE edema bilaterally Neuro: A&Ox2.  Oriented to self and hospital, but not town or time-- thought it was April 2017.  CNII-XII grossly intact, no asterixis MSK:  Neck with full ROM, no tenderness along cervical vertebrae Skin:  No Rash  Imaging/Studies:  US Abdomen RUQ:  08/06/2019.  Negative [place].  No gallstones or wall thickening.  No liver lesions noted.  Portal vein is patent.    CT Head WO Contrast: 08/06/2019.  Normal head CT.  No evidence of acute infarction, hemorrhage, hydrocephalus, extra-axial collection or mass lesion/mass effect.  Negative for fracture or focal  lesion.  No hyperdense vessel or unexpected calcification.  Labs: CBC Latest Ref Rng & Units 08/07/2019 08/06/2019 08/06/2019  WBC 4.0 - 10.5 K/uL 6.2 9.8 -  Hemoglobin 13.0 - 17.0 g/dL 12.9(L) 15.0 13.6  Hematocrit 39.0 - 52.0 % 38.5(L) 44.6 40.0  Platelets 150 - 400 K/uL 168 207 -    CMP Latest Ref Rng & Units 08/07/2019 08/06/2019 08/06/2019  Glucose 70 - 99 mg/dL 81 90 -  BUN 6 - 20 mg/dL 11 18 -  Creatinine 0.61 - 1.24 mg/dL 1.11 1.68(H) -  Sodium 135 - 145 mmol/L 141 139 137  Potassium 3.5 - 5.1 mmol/L 3.3(L) 4.3 4.3  Chloride 98 - 111 mmol/L 104 101 -  CO2 22 - 32 mmol/L 23 20(L) -  Calcium 8.9 - 10.3 mg/dL 8.8(L) 9.2 -  Total Protein 6.5 - 8.1 g/dL 6.4(L) 8.1 -  Total Bilirubin 0.3 - 1.2 mg/dL 2.5(H) 3.1(H) -  Alkaline Phos 38 - 126 U/L 62 91 -  AST 15 - 41 U/L 81(H) 107(H) -  ALT 0 - 44 U/L 73(H) 100(H) -    Assessment/Plan:  Active Problems:   Acute encephalopathy  [name] is a 39 yo male with pmh of chronic Hep C, HIV on Biktarvy, polysubstance use disorder, and hx of syphilis who was admitted for encephalopathy  #Encephalopathy:  Etiology unclear at this  time.  Initially thought confusion was consistent with post-ictal state, though patient presented this morning with similar state of confusion from yesterday with no evidence of seizure activity.  It now seems more likely that he is having lingering effects of substances used two days ago, causing brain fog.  DDx also includes neurosyphilis, given hx of syphilis infection and HIV.  Patient had reactive RPR and reactive fluorescent treponemal ab in July 2020, but RPR titer 1:1 -- though neurosyphilis can still occur.  Though, no otologic or ocular symptoms, and CD4 count >700, HIV viral load undetectable and patient is taking antiretroviral.  Neuro exam and head CT were normal without signs of acute infarction, hemorrhage or injury.  There is no indication for CSF analysis at this time.  Patient denies hx of complicated  alcohol withdrawal, and there is low risk of withdrawal given recent incarceration.  Ethanol level < 10 on admission.  Utox was positive for amphetamines and marijuana, which is consistent with patient history.   -  Continue to monitor mental status -  Call mother for collateral -  CIWA protocol without sx triggered ativan -  Seizure disorder work-up, as below  #Seizure Disorder:  Patient has seizure disorder with unknown etiology.  First seizure was in May 2020 after IV heroine use. He has been on Keppra on and off since that time; treatment was interrupted due to incarceration.  With recent alcohol and drug use, this is a likely trigger for seizures.  Additionally, patient missed several doses of Keppra.   -  Levetiracetam level pending -  Consider neurology c/s  #Hypokalemia:  K+ 3.3 on 2/3.  Likely due to AKI.  Will continue to follow BMP and replete as needed.   -  KCl 40 mg BID, oral packet  #AKI, resolved:  Cr 1.96 on 2/2 --> 1.11 on 2/3.  Baseline Cr ~1.  Likely pre-renal due to poor hydration.  UA with ketones and protein is consistent with this etiology.  Urine studies normal (urine sodium 87, urine osmolality 770) and serum osmolality wnl (295). -Recommend repeat UA in outpatient setting to ensure resolved proteinuria  #Anion gap metabolic acidosis with appropriate respiratory compensation, resolved:  On admission 2/2, pH 7.2, bicarb 18.5, AG 19.  Likely due to alcohol.  Bicarb normalized on BMP today 2/3.  Will continue to monitor BMP.  #Chronic Hepatitis C, genotype 1a:  HVC RNA 61,100 on 1/27. Saw Dr. Staci Righter at [facility] in Aug 2020.   Per chart review, he was not able to pick up Epclusa in Oct 2020 due to incarceration (last picked up in Sept 2020).  Saw Dr. Samara Snide at [facility] on 1/27.  Of note, on 1/27 Hep B core ab reactive, but negative hepatitis B surface antigen indicates resolved Hep B infection.   -  Advise outpatient f/u with Dr. [name] for continuation of  Epclusa  #HIV:  Last seen by Dr. Samara Snide at [facility] on 1/27. At that time, he viral load was undetectable and CD4>700.   Currently takes Bikyarvy once daily without side effects.  -  Continue Biktarvy  #Hx of Syphilis:  Reactive RPR, reactive fluorescent treponemal ab, but RPR titier 1:1 in July 2020. As above, neurosyphilis remains on differential.  According to chart review, results were not shared with patient as he was unable to be reached by phone.  Chlamydia/gonorrhea testing negative on 1/27.    #Polysubstance use disorder  Alcohol use disorder:  Utox  positive for amphetamines and marijuana.   -  Folic acid 1 mg daily -  Thiamine 100 mg daily -  Multivitamin  #Recent incarceration:  For 90 days, released on 08/05/19.  On 1/27, quantiferon gold negative.      LOS: 1 day   Pauline Aus, Medical Student 08/07/2019, 8:18 AM

## 2019-08-07 NOTE — Progress Notes (Addendum)
Collateral from mother:   With patient's permission, medical student Peggye Pitt spoke with patient's mother, Justin [name] ([phone]) on 08/06/2018.  [name] requested not to discuss his drug use with his mother, but gave permission to discuss all other medical issues, including syphilis and HIV.  Patient's mother states that she last saw her son in October 2020.  At that time he told her he "couldn't wait for her to die."  Per patient's mother, Justin [name] does not recall telling her this.  When asked about Justin [name]'s baseline mental status, Justin [name] reports that Justin [name] may have started to have memory problems last October, but had no other specific examples. Justin [name] will not allow her son to stay with her after discharge and suggested looking into group homes.  She has distanced herself from her son due to his "lifestyle" choices, and references his substance use.  She states he can be "sweet, then sour."  Mother states she is a recovering drug addict herself and has been clean for 10 years.  She reports she has a heart condition and diabetes and is not able to care for her son at this time.  She would like to continue to receive updates about her son during his hospitalization.

## 2019-08-08 ENCOUNTER — Encounter (HOSPITAL_COMMUNITY): Payer: Self-pay | Admitting: Student in an Organized Health Care Education/Training Program

## 2019-08-08 DIAGNOSIS — Z652 Problems related to release from prison: Secondary | ICD-10-CM

## 2019-08-08 DIAGNOSIS — Z590 Homelessness: Secondary | ICD-10-CM

## 2019-08-08 HISTORY — PX: LUMBAR PUNCTURE: PRO88

## 2019-08-08 LAB — COMPREHENSIVE METABOLIC PANEL
ALT: 67 U/L — ABNORMAL HIGH (ref 0–44)
AST: 60 U/L — ABNORMAL HIGH (ref 15–41)
Albumin: 3.3 g/dL — ABNORMAL LOW (ref 3.5–5.0)
Alkaline Phosphatase: 70 U/L (ref 38–126)
Anion gap: 8 (ref 5–15)
BUN: 7 mg/dL (ref 6–20)
CO2: 28 mmol/L (ref 22–32)
Calcium: 8.7 mg/dL — ABNORMAL LOW (ref 8.9–10.3)
Chloride: 105 mmol/L (ref 98–111)
Creatinine, Ser: 0.87 mg/dL (ref 0.61–1.24)
GFR calc Af Amer: >60 mL/min (ref >60)
GFR calc non Af Amer: >60 mL/min (ref >60)
Glucose, Bld: 90 mg/dL (ref 70–99)
Potassium: 3.4 mmol/L — ABNORMAL LOW (ref 3.5–5.1)
Sodium: 141 mmol/L (ref 135–145)
Total Bilirubin: 1.4 mg/dL — ABNORMAL HIGH (ref 0.3–1.2)
Total Protein: 6.1 g/dL — ABNORMAL LOW (ref 6.5–8.1)

## 2019-08-08 LAB — CSF CELL COUNT WITH DIFFERENTIAL
RBC Count, CSF: 0 /mm3
RBC Count, CSF: 225 /mm3 — ABNORMAL HIGH
Tube #: 1
Tube #: 3
WBC, CSF: 0 /mm3 (ref 0–5)
WBC, CSF: 0 /mm3 (ref 0–5)

## 2019-08-08 LAB — PROTEIN AND GLUCOSE, CSF
Glucose, CSF: 65 mg/dL (ref 40–70)
Total  Protein, CSF: 38 mg/dL (ref 15–45)

## 2019-08-08 LAB — HIV-1/2 AB - DIFFERENTIATION
HIV 1 Ab: POSITIVE — AB
HIV 2 Ab: NEGATIVE

## 2019-08-08 LAB — LEVETIRACETAM LEVEL: Levetiracetam Lvl: 1.3 ug/mL — ABNORMAL LOW (ref 10.0–40.0)

## 2019-08-08 MED ORDER — POTASSIUM CHLORIDE CRYS ER 20 MEQ PO TBCR
40.0000 meq | EXTENDED_RELEASE_TABLET | Freq: Once | ORAL | Status: AC
  Administered 2019-08-08: 10:00:00 40 meq via ORAL
  Filled 2019-08-08: qty 2

## 2019-08-08 MED ORDER — SERTRALINE HCL 50 MG PO TABS
25.0000 mg | ORAL_TABLET | Freq: Every day | ORAL | Status: DC
  Administered 2019-08-08 – 2019-08-09 (×2): 25 mg via ORAL
  Filled 2019-08-08 (×2): qty 1

## 2019-08-08 MED ORDER — POTASSIUM CHLORIDE CRYS ER 20 MEQ PO TBCR
40.0000 meq | EXTENDED_RELEASE_TABLET | Freq: Once | ORAL | Status: AC
  Administered 2019-08-08: 40 meq via ORAL
  Filled 2019-08-08: qty 2

## 2019-08-08 MED ORDER — NICOTINE 21 MG/24HR TD PT24
21.0000 mg | MEDICATED_PATCH | Freq: Every day | TRANSDERMAL | Status: DC
  Administered 2019-08-08 – 2019-08-09 (×2): 21 mg via TRANSDERMAL
  Filled 2019-08-08 (×2): qty 1

## 2019-08-08 NOTE — Progress Notes (Signed)
Subjective:  [name] is a 39 yo male with pmh of chronic Hep C, HIV on Biktarvy, polysubstance use disorder, and hx of syphilis (treated 08/2014)  This morning, patient had completed breakfast and had attempted to call some friends to arrange for a place to stay post-discharge.    He is oriented to place and situation, though not time.  He is aware that he was found outside in someone's home and was admitted for a possible seizure, though he believes he has been in the hospital for 2 weeks (this is day 2 of hospitalization).  He picks up his medications at [facility] and gets his prescriptions through the [employer].    He continues to endorse headache, though improved, and neck stiffness.  Denies ringing in the ears today.   Objective:  Vital signs in last 24 hours: Vitals:   08/08/19 0000 08/08/19 0400 08/08/19 0423 08/08/19 0842  BP: 112/68 110/74  114/76  Pulse: 83 84  86  Resp: 16 16  20   Temp: 98.3 F (36.8 C) 98.3 F (36.8 C)  97.8 F (36.6 C)  TempSrc: Axillary Oral  Oral  SpO2: 94% 96%  98%  Weight:   87.8 kg   Height:       Weight change: 1.617 kg  Intake/Output Summary (Last 24 hours) at 08/08/2019 1018 Last data filed at 08/07/2019 2100 Gross per 24 hour  Intake 240 ml  Output --  Net 240 ml   Physical Exam: Gen: Tired-appearing male in NAD HEENT:Normocephalic, atraumatic.  Neuro: A&Ox2.  Oriented to self, hospital, and situation but not time-- thought it was 2021.   CNII-XII grossly intact MSK:  Neck with full ROM Skin:  No Rash  Imaging/Studies:  2022 Abdomen RUQ:  08/06/2019.  Negative 10/04/2019.  No gallstones or wall thickening.  No liver lesions noted.  Portal vein is patent.    CT Head WO Contrast: 08/06/2019.  Normal head CT.  No evidence of acute infarction, hemorrhage, hydrocephalus, extra-axial collection or mass lesion/mass effect.  Negative for fracture or focal lesion.  No hyperdense vessel or unexpected  calcification.  Labs: CBC Latest Ref Rng & Units 08/07/2019 08/06/2019 08/06/2019  WBC 4.0 - 10.5 K/uL 6.2 9.8 -  Hemoglobin 13.0 - 17.0 g/dL 12.9(L) 15.0 13.6  Hematocrit 39.0 - 52.0 % 38.5(L) 44.6 40.0  Platelets 150 - 400 K/uL 168 207 -    CMP Latest Ref Rng & Units 08/08/2019 08/07/2019 08/06/2019  Glucose 70 - 99 mg/dL 90 81 90  BUN 6 - 20 mg/dL 7 11 18   Creatinine 0.61 - 1.24 mg/dL 10/04/2019 2.44)  Sodium 135 - 145 mmol/L 141 141 139  Potassium 3.5 - 5.1 mmol/L 3.4(L) 3.3(L) 4.3  Chloride 98 - 111 mmol/L 105 104 101  CO2 22 - 32 mmol/L 28 23 20(L)  Calcium 8.9 - 10.3 mg/dL 0.10) 2.72(Z) 9.2  Total Protein 6.5 - 8.1 g/dL 6.1(L) 6.4(L) 8.1  Total Bilirubin 0.3 - 1.2 mg/dL 3.6(U) 2.5(H) 3.1(H)  Alkaline Phos 38 - 126 U/L 70 62 91  AST 15 - 41 U/L 60(H) 81(H) 107(H)  ALT 0 - 44 U/L 67(H) 73(H) 100(H)    Assessment/Plan:  Principal Problem:   Acute encephalopathy Active Problems:   Substance use disorder   HIV disease (HCC)   Acute kidney injury [facility])  [name] is a 39 yo male with pmh of chronic Hep C, HIV on Biktarvy, polysubstance use disorder, and hx of syphilis who was  admitted for encephalopathy  #Encephalopathy:  Etiology unclear at this time.  Initially thought confusion was consistent with post-ictal state, though continues to present with similar state of confusion in days following admission with no evidence of seizure activity.  It seems unlikely that he is experiencing lingering effects of substances used several days ago, and withdrawal is unlikely as patient was incarcerated for 3 months prior to admission.  We must continue to consider neurosyphilis, given hx of syphilis infection and HIV (CD4 count >700, HIV viral load undetectable and patient is taking antiretroviral), confusion, neck stiffness and tinnitis.  Patient had reactive RPR and reactive fluorescent treponemal AB in July 2020, but RPR titer 1:1 -- though neurosyphilis can still occur.  Ddx also  includes functional encephalopathy, HIV associated dementia, or HSV encephalopathy.  It is reassuring that patient remains afebrile and hemodynamically stable, and neuro exam and head CT were normal without signs of acute infarction, hemorrhage or injury.  We will complete a lumbar puncture today to analyze CSF.   -  Lumbar puncture for CSF analysis -  Continue to monitor mental status -  CIWA protocol without sx triggered ativan -  Seizure disorder work-up/tx, as below  #Seizure Disorder:  Patient has known seizure disorder with unknown etiology.  First seizure was in May 2020 after IV heroine use. He has been on Keppra on and off since that time; treatment was interrupted due to incarceration.  Levetiracetam level is below therapeutic level (1.3 on 08/06/19), which is consistent with patient hx as he reports missing several doses of Keppra. Alcohol and drug use are likely triggers for seizures.   -  Continue Keppra -  Consider neurology referral at discharge  #Hypokalemia:  K+ 3.3 on 2/3 --> 3.4 on 2/4.  Will continue to follow BMP and replete as needed.   -  Repleted K+ today 2/4  #AKI, resolved:  Cr 1.96 on 2/2 --> now normalized.  Baseline Cr ~1.  Likely pre-renal due to poor hydration.  UA with ketones and protein is consistent with this etiology.  Urine studies normal (urine sodium 87, urine osmolality 770) and serum osmolality wnl (295). -Recommend repeat UA in outpatient setting to ensure resolved proteinuria  #Anion gap metabolic acidosis with appropriate respiratory compensation, resolved:  On admission, pH 7.2, bicarb 18.5, AG 19.  Likely due to alcohol.  Bicarb normalized on BMP on 2/3.  Will continue to monitor BMP.  #Chronic Hepatitis C, genotype 1a:  HVC RNA 61,100 on 1/27. Saw Dr. Staci Righter at [facility] in Aug 2020.   Per chart review, he was not able to pick up Epclusa in Oct 2020 due to incarceration (last picked up in Sept 2020).  Saw Dr. Samara Snide at [facility] on 1/27.   Of note, on 1/27 Hep B core ab reactive, but negative hepatitis B surface antigen indicates resolved Hep B infection.   -  Advise outpatient f/u with Dr. [name] for continuation of Epclusa  #HIV:  Last seen by Dr. Samara Snide at [facility] on 1/27. At that time, viral load was undetectable and CD4>700.   Currently takes Bikyarvy once daily without side effects.  -  Continue Biktarvy  #Hx of Syphilis:  Treated Feb 2016.  Reactive RPR, reactive fluorescent treponemal ab, but RPR titier 1:1 in July 2020. As above, neurosyphilis remains on differential.  According to chart review, results were not shared with patient as he was unable to be reached by phone.  Chlamydia/gonorrhea testing  negative on 1/27.    #Polysubstance use disorder  Alcohol use disorder:  Utox positive for amphetamines and marijuana.   -  Folic acid 1 mg daily -  Thiamine 100 mg daily -  Multivitamin -  Discuss resources prior to discharge.  #Recent incarceration:  For 90 days, released on 08/05/19.  On 1/27, quantiferon gold negative.      LOS: 2 days   Johny Blamer, Medical Student 08/08/2019, 10:18 AM

## 2019-08-08 NOTE — TOC Progression Note (Signed)
Transition of Care [facility]) - Progression Note    Patient Details  Name: Justin [name] MRN: [id number] Date of Birth: Aug 27, 1980  Transition [facility] Care [facility] CM/SW Contact  Terrilee Croak, Student-Social Work Phone Number: 08/08/2019, 3:47 PM  Clinical Narrative:    MSW Intern went to speak with pt to get information on discharge details. PT stated that his primary care physician is Dr. Joylene Grapes from [facility] [address]. He denied resources for substance abuse, he said he just wanted to have fun after getting out of jail, and that he wasn't addicted. He stated that he did not know where exactly he was, and did not have any place to go when discharging. Resources were given for shelters in the area, and pt will follow up with shelters. SW will continue to follow.        Expected Discharge Plan and Services                                                 Social Determinants of Health (SDOH) Interventions    Readmission Risk Interventions No flowsheet data found.

## 2019-08-08 NOTE — Procedures (Addendum)
Lumbar Puncture Procedure Note  Pre-operative Diagnosis:  Person living with HIV admitted with encephalopathy  Indications: Evaluate for neurosyphilis  Procedure Details:  Informed consent was obtained after explanation of the risks and benefits of the procedure, refer to the consent documentation.  Time-out performed immediately prior to the procedure.  Patient was placed in the sitting position.  The superior aspect of the iliac crests were identified, with the traverse demarcating the L4-L5 interspace.  A bedside ultrasound was used to help identify the spinous processes at L3 - L4 and the intervertebral space was located and marked.  This area was prepped and draped in the usual sterile fashion. Maximum sterile technique was used including antiseptics, cap, gloves, gown, hand hygiene, mask, and sterile sheet.  Local anesthesia with 1% lidocaine was applied subcutaneously then deep to the skin. The spinal needle with trocar was introduced with frequent removal of the trocar to evaluate for cerebrospinal fluid. Two passes were required to find the CSF. Opening pressure was not necessary. Samples were collected in four separate tubes and sent to the lab after proper labeling. The spinal needle with trocar was removed, with minimal bleeding noted upon removal. A sterile bandage was placed over the puncture site after holding pressure.  Findings: 29mL of clear spinal fluid was obtained. Tube 1 was sent for CSF cell count with differential  Tube 2 was sent for Protein and glucose, CSF Tube 3 was sent for VDRL, CSF Tube 4 was sent for CSF cell count with differential (repeat)        Condition:   The patient tolerated the procedure well, but did develop a headache upon completion of the procedure  Complications: None; patient tolerated the procedure well.  Plan: Pt to remain supine for 1 hour. Provided tylenol for headache.    Procedure performed by Peggye Pitt, MS4   Attending  Attestation:  I was present for the entirety of the procedure and personally performed key portions.  Erlinda Hong, MD

## 2019-08-09 ENCOUNTER — Encounter: Payer: Self-pay | Admitting: Internal Medicine

## 2019-08-09 DIAGNOSIS — F329 Major depressive disorder, single episode, unspecified: Secondary | ICD-10-CM

## 2019-08-09 LAB — BASIC METABOLIC PANEL
Anion gap: 8 (ref 5–15)
BUN: 11 mg/dL (ref 6–20)
CO2: 27 mmol/L (ref 22–32)
Calcium: 9.4 mg/dL (ref 8.9–10.3)
Chloride: 105 mmol/L (ref 98–111)
Creatinine, Ser: 1.05 mg/dL (ref 0.61–1.24)
GFR calc Af Amer: >60 mL/min (ref >60)
GFR calc non Af Amer: >60 mL/min (ref >60)
Glucose, Bld: 139 mg/dL — ABNORMAL HIGH (ref 70–99)
Potassium: 4.3 mmol/L (ref 3.5–5.1)
Sodium: 140 mmol/L (ref 135–145)

## 2019-08-09 LAB — RPR: RPR Ser Ql: NONREACTIVE

## 2019-08-09 LAB — VDRL, CSF: VDRL Quant, CSF: NONREACTIVE

## 2019-08-09 MED ORDER — SERTRALINE HCL 25 MG PO TABS: 25.0000 mg | ORAL_TABLET | Freq: Every day | ORAL | 0 refills | Status: DC

## 2019-08-09 MED ORDER — BIKTARVY 50-200-25 MG PO TABS: 1.0000 | ORAL_TABLET | Freq: Every day | ORAL | 0 refills | Status: DC

## 2019-08-09 MED ORDER — LEVETIRACETAM 500 MG PO TABS: 500.0000 mg | ORAL_TABLET | Freq: Two times a day (BID) | ORAL | 0 refills | Status: DC

## 2019-08-09 NOTE — Discharge Summary (Addendum)
Name: Justin [name] MRN: [id number] DOB: 06/12/1981 39 y.o. PCP: Patient, No Pcp Per  Date of Admission: 08/06/2019  7:27 AM Date of Discharge: 08/09/2019 Attending Physician: Lalla Brothers, MD  Discharge Diagnosis: 1.  Encephalopathy 2.  Seizure Disorder 3.  Depression/Anxiety  4.  Chronic Hepatitis C, genotype 1a 5.  HIV 6.  Hx of Syphilis 7.  Polysubstance use disorder  Alcohol use disorder 8.  Recent incarceration  Discharge Medications: Allergies as of 08/09/2019   No Known Allergies      Medication List     TAKE these medications    Biktarvy 50-200-25 MG Tabs tablet Generic drug: bictegravir-emtricitabine-tenofovir AF Take 1 tablet by mouth daily.   levETIRAcetam 500 MG tablet Commonly known as: Keppra Take 1 tablet (500 mg total) by mouth 2 (two) times daily.   sertraline 25 MG tablet Commonly known as: ZOLOFT Take 1 tablet (25 mg total) by mouth daily.        Disposition and follow-up:   Justin [name] was discharged from [facility] in Stable condition.  At the hospital follow up visit please address:  1.   Depression:   -  F/u re: mood with PHQ9.  Started sertraline during inpatient stay.  Uptitrate if tolerating well.    Proteinuria:  Likely due to dehydration. - Consider repeat UA in outpatient setting to ensure resolved proteinuria  Chronic Hepatitis C, genotype 1a: -  Advise outpatient f/u with Dr. Linus Salmons for continuation of Epclusa  HIV: -  Continue Biktarvy and continue f/u with Infectious Disease  2.  Labs / imaging needed at time of follow-up:    -  Consider UA, as above  3.  Pending labs/ test needing follow-up:  -  CSF VDRL  Follow-up Appointments: Follow-up Information     [facility] Follow up.   Why: Call for follow-up appointment to occur within the next week. Contact information: [address] [facility]  Course by problem list: Justin [name] is a 39 yo male with pmh of chronic Hep C, HIV on Biktarvy, polysubstance use disorder, and hx of syphilis (treated 08/2014) who was admitted for encephalopathy  #Encephalopathy:  Most likely etiology is functional encephalopathy.  Initially confusion thought to be post-ictal, though continued to present with similar state of confusion in days following admission with no evidence of seizure activity.  Possible that he was experiencing lingering effects of substances used day prior to admission.  Withdrawal is unlikely as patient was incarcerated for 3 months prior to admission. Neurosyphilis considered given hx of syphilis infection and HIV (CD4 count >700, HIV viral load undetectable and patient is taking antiretroviral), confusion, neck stiffness and tinnitis. Though this was ruled out as CSF with 0 RBC and WBC, normal glucose and protein, no organisms present, VDRL still pending. On 08/09/19, RPR negative.  In July 2020, patient had reactive RPR and reactive fluorescent treponemal AB, but RPR titer 1:1.  Ddx also included HIV associated dementia, or HSV encephalopathy (0 RBCs in CSF).  It is reassuring that patient remains afebrile and hemodynamically stable, and neuro exam and head CT were normal without signs of acute infarction, hemorrhage or injury.  Despite his memory deficits, he does show good capacity to make his own medical decisions and will be safe to discharge home.   #Seizure Disorder:  Patient has known seizure disorder with unknown etiology.  First seizure was in May 2020 after IV heroine use.  Levetiracetam level is below therapeutic level (1.3 on 08/06/19), which is consistent with patient hx as he reports missing several doses of Keppra. Alcohol and drug use are likely triggers for seizures.  Counseled patient on risks of alcohol and drug use as triggers for seizures and advised that he continue Keppra for seizure prevention   #Depression:  Patient  tearful at times during hospitalization and expressed feeling overwhelmed by social stressors (I.e., recent incarceration, relationship with mother, unstable housing, substance use disorder).  With group decision making, patient was amenable to starting an anti-depressant.  Started sertraline 25 mg daily and advised follow-up with [facility] clinic for re-evaluation of mood and adjustment of medications.     #Hypokalemia, resolved:  K+ 3.3 on 2/3 --> 3.4 on 2/4 --> 4.3 on 2/5 after repletion.     #AKI, resolved:  Cr 1.96 on 2/2 --> now normalized.  Baseline Cr ~1.  Likely pre-renal due to poor hydration.  UA with ketones and protein is consistent with this etiology.  Urine studies normal (urine sodium 87, urine osmolality 770) and serum osmolality wnl (295).  Recommend repeat UA in outpatient setting to ensure resolved proteinuria.     #Anion gap metabolic acidosis with appropriate respiratory compensation, resolved:  On admission, pH 7.2, bicarb 18.5, AG 19.  Likely due to alcohol.  Bicarb normalized on BMP on 2/3.  Will continue to monitor BMP.   #Chronic Hepatitis C, genotype 1a:  HVC RNA 61,100 on 1/27. Saw Dr. Staci Righter at [facility] in Aug 2020.   Per chart review, he was not able to pick up Epclusa in Oct 2020 due to incarceration (last picked up in Sept 2020).  Saw Dr. Samara Snide at [facility] on 1/27.  Of note, on 1/27 Hep B core ab reactive, but negative hepatitis B surface antigen indicates resolved Hep B infection.   -  Advise outpatient f/u with Dr. [name] for continuation of Epclusa   #HIV:  Last seen by Dr. Samara Snide at [facility] on 1/27. At that time, viral load was undetectable and CD4>700.   Currently takes Bikyarvy once daily without side effects.  -  Continue Biktarvy   #Hx of Syphilis:  Treated Feb 2016.  On 08/09/19, RPR negative.  In July 2020, reactive RPR, reactive fluorescent treponemal ab, but RPR titier 1:1.   Chlamydia/gonorrhea testing negative on 1/27.     #Polysubstance use disorder  Alcohol use disorder:  Utox positive for amphetamines and marijuana.  Consistent with patient hx -  Folic acid 1 mg daily -  Thiamine 100 mg daily -  Multivitamin   #Recent incarceration:  For 90 days, released on 08/05/19.  On 1/27, quantiferon gold negative.    Discharge Vitals:   BP 113/78 (BP Location: Left Arm)   Pulse 70   Temp 98 F (36.7 C) (Oral)   Resp 20   Ht 6' (1.829 m)   Wt 87.8 kg   SpO2 98%   BMI 26.25 kg/m   Pertinent Labs, Studies, and Procedures:   CMP Latest Ref Rng & Units 08/09/2019 08/08/2019 08/07/2019  Glucose 70 - 99 mg/dL 657(Q) 90 81  BUN 6 - 20 mg/dL 11 7 11   Creatinine 0.61 - 1.24 mg/dL 4.69 6.29  Sodium 135 - 145 mmol/L 140 141 141  Potassium 3.5 - 5.1 mmol/L 4.3 3.4(L) 3.3(L)  Chloride 98 - 111 mmol/L 105 105 104  CO2 22 - 32 mmol/L 27 28 23   Calcium 8.9 - 10.3 mg/dL 9.4 ) 0.0(B)  Total Protein 6.5 - 8.1 g/dL - 6.1(L) 6.4(L)  Total Bilirubin 0.3 - 1.2 mg/dL - 7.0(W) 2.5(H)  Alkaline Phos 38 - 126 U/L - 70 62  AST 15 - 41 U/L - 60(H) 81(H)  ALT 0 - 44 U/L - 67(H) 73(H)    CSF analysis:   Reference Range 1 d ago  (08/08/19) 1 d ago  (08/08/19)  Tube #   3  1   Color, CSF COLORLESS COLORLESS  COLORLESS   Appearance, CSF CLEAR CLEAR  CLEAR   Supernatant   NOT INDICATED  NOT INDICATED   RBC Count, CSF 0 /cu mm 0  225High    WBC, CSF 0 - 5 /cu mm 0  0   Lymphs, CSF 40 - 80 % RARE  RARE   Other Cells, CSF   TOO FEW TO COUNT, SMEAR AVAILABLE FOR REVIEW  TOO FEW TO COUNT, SMEAR AVAILABLE FOR REVIEW CM   Comment: Performed at [facility], [address], [place], [place]  Segmented Neutrophils-CSF     RARE R   Monocyte-Macrophage-Spinal Fluid     RARE R       Ref Range & Units 1 d ago  Glucose, CSF 40 - 70 mg/dL 65   Total  Protein, CSF 15 - 45 mg/dL 38     CSF culture: Component 1 d ago  Specimen Description CSF   Special Requests Normal     Gram Stain WBC PRESENT, PREDOMINANTLY MONONUCLEAR  NO ORGANISMS SEEN  CYTOSPIN SMEAR  Performed at [facility], [address] [place], [place]   Culture PENDING   Report Status PENDING    50388 Abdomen RUQ:  08/06/2019.  Negative 10/04/2019.  No gallstones or wall thickening.  No liver lesions noted.  Portal vein is patent.     CT Head WO Contrast: 08/06/2019.  Normal head CT.  No evidence of acute infarction, hemorrhage, hydrocephalus, extra-axial collection or mass lesion/mass effect.  Negative for fracture or focal lesion.  No hyperdense vessel or unexpected calcification.  Discharge Instructions: Discharge Instructions     Call MD for:  persistant nausea and vomiting   Complete by: As directed    Call MD for:  temperature >100.4   Complete by: As directed    Diet - low sodium heart healthy   Complete by: As directed    Discharge instructions   Complete by: As directed    Mr. Linson, it was a pleasure taking care of you.  You were seen in the hospital for confusion, which we think may have been due to a combination of your seizure disorder and substance use.  Because of your history of a syphilis infection, we did a lumbar puncture to see if you had a syphilis infection in your spinal fluid.  This test was negative and there were no signs of neurosyphilis.  We strongly advise that you stop drinking alcohol and using other substances, as these can be triggers for seizures.  It is also important for you to continue to take Keppra to prevent seizures.  Please call the [facility] to make sure you a follow-up appointment within the next week. It is also important to follow up with [facility] regarding treatment for Hepatitis C.   Increase activity slowly   Complete by: As directed  Signed: Levora Dredge, MD 08/09/2019, 3:04 PM

## 2019-08-09 NOTE — TOC Transition Note (Signed)
Transition of Care [facility]) - CM/SW Discharge Note   Patient Details  Name: Justin [name] MRN: [id number] Date of Birth: 01-10-1981  Transition of Care [facility]) CM/SW Contact:  Kermit Balo, RN Phone Number: 08/09/2019, 11:53 AM   Clinical Narrative:    Pt discharging to brothers home and brother to provide transportation.  Pt states he has discount at the pharmacy his medications were sent to and needs no further assist.  [facility] has picked him up in their clinic.  No further needs per CM.   Final next level of care: Home/Self Care Barriers to Discharge: Inadequate or no insurance, Barriers Unresolved (comment)   Patient Goals and CMS Choice        Discharge Placement                       Discharge Plan and Services                                     Social Determinants of Health (SDOH) Interventions     Readmission Risk Interventions No flowsheet data found.

## 2019-08-09 NOTE — Progress Notes (Signed)
Patient discharged home with family member. Patient updated on new medications and expressed understanding. PIV removed without difficulty, all belongings with pt. Patient left the unit ambulatory with RN.

## 2019-08-09 NOTE — Progress Notes (Signed)
Subjective:  Patient states he has arranged housing post-discharge.  His back is somewhat sore from lumbar puncture yesterday, but overall he is doing well.    Objective:  Vital signs in last 24 hours: Vitals:   08/09/19 0402 08/09/19 0700 08/09/19 0807 08/09/19 1150  BP: 94/61 (!) 82/56 (!) 89/65 113/78  Pulse: 69 80 75 70  Resp: 17 20 18 20   Temp: 98.2 F (36.8 C) 98.4 F (36.9 C)  98 F (36.7 C)  TempSrc: Oral Oral  Oral  SpO2: 96% 96%  98%  Weight:      Height:       Weight change:   Intake/Output Summary (Last 24 hours) at 08/09/2019 1346 Last data filed at 08/09/2019 0949 Gross per 24 hour  Intake 720 ml  Output --  Net 720 ml   Physical Exam: General Resting in bed, no acute distress  Pulmonary Breathing comfortably on room air, no cough, no distress   Neurology Alert and answers questions appropriately, no gross deficit   Labs: CBC Latest Ref Rng & Units 08/07/2019 08/06/2019 08/06/2019  WBC 4.0 - 10.5 K/uL 6.2 9.8 -  Hemoglobin 13.0 - 17.0 g/dL 12.9(L) 15.0 13.6  Hematocrit 39.0 - 52.0 % 38.5(L) 44.6 40.0  Platelets 150 - 400 K/uL 168 207 -    CMP Latest Ref Rng & Units 08/09/2019 08/08/2019 08/07/2019  Glucose 70 - 99 mg/dL 10/05/2019) 90 81  BUN 6 - 20 mg/dL 11 7 11   Creatinine 0.61 - 1.24 mg/dL 841(Y 6.06  Sodium 135 - 145 mmol/L 140 141 141  Potassium 3.5 - 5.1 mmol/L 4.3 3.4(L) 3.3(L)  Chloride 98 - 111 mmol/L 105 105 104  CO2 22 - 32 mmol/L 27 28 23   Calcium 8.9 - 10.3 mg/dL 9.4 3.01) 6.01)  Total Protein 6.5 - 8.1 g/dL - 6.1(L) 6.4(L)  Total Bilirubin 0.3 - 1.2 mg/dL - ) 2.5(H)  Alkaline Phos 38 - 126 U/L - 70 62  AST 15 - 41 U/L - 60(H) 81(H)  ALT 0 - 44 U/L - 67(H) 73(H)   CSF analysis:  Reference Range 1 d ago  (08/08/19) 1 d ago  (08/08/19)  Tube #  3  1   Color, CSF COLORLESS COLORLESS  COLORLESS   Appearance, CSF CLEAR CLEAR  CLEAR   Supernatant  NOT INDICATED  NOT INDICATED   RBC Count, CSF 0 /cu mm 0  225High    WBC, CSF 0 - 5 /cu mm 0   0   Lymphs, CSF 40 - 80 % RARE  RARE   Other Cells, CSF  TOO FEW TO COUNT, SMEAR AVAILABLE FOR REVIEW  TOO FEW TO COUNT, SMEAR AVAILABLE FOR REVIEW CM   Comment: Performed at [facility], [address] [place], [facility] [address]  Segmented Neutrophils-CSF   RARE R   Monocyte-Macrophage-Spinal Fluid   RARE R     Ref Range & Units 1 d ago  Glucose, CSF 40 - 70 mg/dL 65   Total Protein, CSF 15 - 45 mg/dL 38    CSF culture: Component 1 d ago  Specimen Description CSF   Special Requests Normal   Gram Stain WBC PRESENT, PREDOMINANTLY MONONUCLEAR  NO ORGANISMS SEEN  CYTOSPIN SMEAR  Performed at [facility], [address] [place], [facility] [address]   Culture PENDING   Report Status PENDING    Assessment/Plan:  Principal Problem:   Acute encephalopathy Active Problems:   Substance use disorder  HIV disease ([place])   Acute kidney injury [facility])  [name] is a 39 yo male with pmh of chronic Hep C, HIV on Biktarvy, polysubstance use disorder, and hx of syphilis (treated 08/2014) who was admitted for encephalopathy  #Encephalopathy:  Most likely etiology is functional encephalopathy.  Initially confusion thought to be post-ictal, though continued to present with similar state of confusion in days following admission with no evidence of seizure activity.  Possible that he was experiencing lingering effects of substances used day prior to admission.  Withdrawal is unlikely as patient was incarcerated for 3 months prior to admission. Neurosyphilis considered given hx of syphilis infection and HIV (CD4 count >700, HIV viral load undetectable and patient is taking antiretroviral), confusion, neck stiffness and tinnitis. Though this was ruled out as CSF with 0 RBC and WBC, normal glucose and protein, no organisms present, VDRL still pending. On 08/09/19, RPR negative.  In July 2020, patient had reactive RPR and reactive fluorescent treponemal AB, but RPR titer 1:1.  Ddx also HIV  associated dementia, or HSV encephalopathy (0 RBCs in CSF).  It is reassuring that patient remains afebrile and hemodynamically stable, and neuro exam and head CT were normal without signs of acute infarction, hemorrhage or injury.  Despite his memory deficits, he does show good capacity to make his own medical decisions and will be safe to discharge home. -  CSF VDRL pending -  Seizure disorder work-up/tx, as below  #Seizure Disorder:  Patient has known seizure disorder with unknown etiology.  First seizure was in May 2020 after IV heroine use. He has been on Keppra on and off since that time; treatment was interrupted due to incarceration.  Levetiracetam level is below therapeutic level (1.3 on 08/06/19), which is consistent with patient hx as he reports missing several doses of Keppra. Alcohol and drug use are likely triggers for seizures.   -  Continue Keppra -  Counseled patient on risks of alcohol and drug use as triggers for seizures  #Depression:  Patient tearful at times during hospitalization and expressed feeling overwhelmed by social stressors (I.e., recent incarceration, relationship with mother, unstable housing, substance use disorder).  With group decision making, patient was amenable to starting an anti-depressant.   -  Start sertraline 25 mg daily -  Advise f/u with primary care provider on discharge.  #Hypokalemia, resolved:  K+ 3.3 on 2/3 --> 3.4 on 2/4 --> 4.3 on 2/5 after repletion.    #AKI, resolved:  Cr 1.96 on 2/2 --> now normalized.  Baseline Cr ~1.  Likely pre-renal due to poor hydration.  UA with ketones and protein is consistent with this etiology.  Urine studies normal (urine sodium 87, urine osmolality 770) and serum osmolality wnl (295). -Recommend repeat UA in outpatient setting to ensure resolved proteinuria  #Anion gap metabolic acidosis with appropriate respiratory compensation, resolved:  On admission, pH 7.2, bicarb 18.5, AG 19.  Likely due to alcohol.  Bicarb  normalized on BMP on 2/3.  Will continue to monitor BMP.  #Chronic Hepatitis C, genotype 1a:  HVC RNA 61,100 on 1/27. Saw Dr. Scharlene Gloss at [facility] in Aug 2020.   Per chart review, he was not able to pick up Epclusa in Oct 2020 due to incarceration (last picked up in Sept 2020).  Saw Dr. Sharen Hones at [facility] on 1/27.  Of note, on 1/27 Hep B core ab reactive, but negative hepatitis B surface antigen indicates resolved Hep B infection.   -  Advise outpatient f/u with Dr. [name] for continuation of Epclusa  #HIV:  Last seen by Dr. Samara Snide at [facility] on 1/27. At that time, viral load was undetectable and CD4>700.   Currently takes Bikyarvy once daily without side effects.  -  Continue Biktarvy  #Hx of Syphilis:  Treated Feb 2016.  On 08/09/19, RPR negative.  In July 2020, reactive RPR, reactive fluorescent treponemal ab, but RPR titier 1:1.  Chlamydia/gonorrhea testing negative on 1/27.    #Polysubstance use disorder  Alcohol use disorder:  Utox positive for amphetamines and marijuana.  Consistent with patient hx -  Folic acid 1 mg daily -  Thiamine 100 mg daily -  Multivitamin  #Recent incarceration:  For 90 days, released on 08/05/19.  On 1/27, quantiferon gold negative.      LOS: 3 days   Pauline Aus, Medical Student 08/09/2019, 1:46 PM

## 2019-08-11 ENCOUNTER — Other Ambulatory Visit: Payer: Self-pay

## 2019-08-11 ENCOUNTER — Emergency Department (HOSPITAL_COMMUNITY)
Admission: EM | Admit: 2019-08-11 | Discharge: 2019-08-11 | Disposition: A | Discharge status: Home / Self Care | Payer: PRIVATE HEALTH INSURANCE | Attending: Emergency Medicine | Admitting: Emergency Medicine

## 2019-08-11 ENCOUNTER — Encounter (HOSPITAL_COMMUNITY): Payer: Self-pay

## 2019-08-11 DIAGNOSIS — Z79899 Other long term (current) drug therapy: Secondary | ICD-10-CM | POA: Insufficient documentation

## 2019-08-11 DIAGNOSIS — T40601A Poisoning by unspecified narcotics, accidental (unintentional), initial encounter: Secondary | ICD-10-CM

## 2019-08-11 DIAGNOSIS — B20 Human immunodeficiency virus [HIV] disease: Secondary | ICD-10-CM | POA: Insufficient documentation

## 2019-08-11 DIAGNOSIS — T401X1A Poisoning by heroin, accidental (unintentional), initial encounter: Secondary | ICD-10-CM | POA: Insufficient documentation

## 2019-08-11 DIAGNOSIS — F17210 Nicotine dependence, cigarettes, uncomplicated: Secondary | ICD-10-CM | POA: Insufficient documentation

## 2019-08-11 LAB — CBC WITH DIFFERENTIAL/PLATELET
Abs Immature Granulocytes: 0.04 10*3/uL (ref 0.00–0.07)
Basophils Absolute: 0 10*3/uL (ref 0.0–0.1)
Basophils Relative: 0 %
Eosinophils Absolute: 0 10*3/uL (ref 0.0–0.5)
Eosinophils Relative: 0 %
HCT: 53.6 % — ABNORMAL HIGH (ref 39.0–52.0)
Hemoglobin: 17.3 g/dL — ABNORMAL HIGH (ref 13.0–17.0)
Immature Granulocytes: 1 %
Lymphocytes Relative: 18 %
Lymphs Abs: 1.3 10*3/uL (ref 0.7–4.0)
MCH: 33.3 pg (ref 26.0–34.0)
MCHC: 32.3 g/dL (ref 30.0–36.0)
MCV: 103.1 fL — ABNORMAL HIGH (ref 80.0–100.0)
Monocytes Absolute: 0.5 10*3/uL (ref 0.1–1.0)
Monocytes Relative: 8 %
Neutro Abs: 5.3 10*3/uL (ref 1.7–7.7)
Neutrophils Relative %: 73 %
Platelets: 217 10*3/uL (ref 150–400)
RBC: 5.2 MIL/uL (ref 4.22–5.81)
RDW: 12.5 % (ref 11.5–15.5)
WBC: 7.2 10*3/uL (ref 4.0–10.5)
nRBC: 0 % (ref 0.0–0.2)

## 2019-08-11 LAB — COMPREHENSIVE METABOLIC PANEL
ALT: 106 U/L — ABNORMAL HIGH (ref 0–44)
AST: 82 U/L — ABNORMAL HIGH (ref 15–41)
Albumin: 4.4 g/dL (ref 3.5–5.0)
Alkaline Phosphatase: 77 U/L (ref 38–126)
Anion gap: 11 (ref 5–15)
BUN: 16 mg/dL (ref 6–20)
CO2: 27 mmol/L (ref 22–32)
Calcium: 9.7 mg/dL (ref 8.9–10.3)
Chloride: 100 mmol/L (ref 98–111)
Creatinine, Ser: 1.18 mg/dL (ref 0.61–1.24)
GFR calc Af Amer: >60 mL/min (ref >60)
GFR calc non Af Amer: >60 mL/min (ref >60)
Glucose, Bld: 94 mg/dL (ref 70–99)
Potassium: 3.6 mmol/L (ref 3.5–5.1)
Sodium: 138 mmol/L (ref 135–145)
Total Bilirubin: 1.3 mg/dL — ABNORMAL HIGH (ref 0.3–1.2)
Total Protein: 8.3 g/dL — ABNORMAL HIGH (ref 6.5–8.1)

## 2019-08-11 LAB — CSF CULTURE W GRAM STAIN
Culture: NO GROWTH
Special Requests: NORMAL

## 2019-08-11 LAB — ETHANOL: Alcohol, Ethyl (B): <10 mg/dL (ref <10)

## 2019-08-11 LAB — AMMONIA: Ammonia: 29 umol/L (ref 9–35)

## 2019-08-11 MED ORDER — SODIUM CHLORIDE 0.9 % IV BOLUS
1000.0000 mL | Freq: Once | INTRAVENOUS | Status: AC
  Administered 2019-08-11: 1000 mL via INTRAVENOUS

## 2019-08-11 MED ORDER — NALOXONE HCL 2 MG/2ML IJ SOSY
PREFILLED_SYRINGE | INTRAMUSCULAR | Status: AC
  Filled 2019-08-11: qty 2

## 2019-08-11 MED ORDER — NALOXONE HCL 0.4 MG/ML IJ SOLN
0.4000 mg | Freq: Once | INTRAMUSCULAR | Status: AC
  Administered 2019-08-11: 0.2 mg via INTRAVENOUS

## 2019-08-11 NOTE — Discharge Instructions (Addendum)
You were seen in the emergency department after being unresponsive.  Your symptoms improved with some Narcan.  We are providing you with some resources if you are looking for assistance with substance abuse.  Please return to the emergency department if any worsening symptoms.

## 2019-08-11 NOTE — ED Triage Notes (Signed)
Patient arrived after a heroin overdose, friends on scene administered 2mg  of Narcan prior to arrival. Patient responding to pain. On nonbreather.

## 2019-08-11 NOTE — ED Notes (Signed)
Patient provided with paper scrub top.

## 2019-08-11 NOTE — ED Notes (Signed)
Patient able to maintain oxygen saturation above 95% while ambulating around the room. Declines any dizziness.

## 2019-08-11 NOTE — ED Provider Notes (Signed)
[facility] DEPT Provider Note   CSN: [id number] Arrival date & time: 08/11/19  2006     History Chief Complaint  Patient presents with  . Drug Overdose    Justin [name] is a 39 y.o. male.  He is brought in by EMS after [place]  He was found to have very slow respiratory rate so was given 2 mg Narcan with some improvement in his mental status.  Level 5 caveat secondary to altered mental status.  On review of prior records he was admitted to [facility] just 6 days ago with a similar unresponsive episode.  Ultimately it was felt to be mostly due to substance abuse although he has an underlying hep C history and his ammonia was elevated.  He also has a history of HIV and so had a spinal tap to exclude CNS infection.  He was discharged 2 days ago.  The history is provided by the EMS personnel.  Drug Overdose This is a recurrent problem. The problem has not changed since onset.He has tried nothing for the symptoms. The treatment provided no relief.       Past Medical History:  Diagnosis Date  . Endocarditis   . History of syphilis 08/25/2014   Treated by [facility] 07-2014.  [name] HIV (human immunodeficiency virus infection) [facility])     Patient Active Problem List   Diagnosis Date Noted  . Substance use disorder 08/07/2019  . HIV disease ([place]) 08/07/2019  . Acute kidney injury ([place]) 08/07/2019  . Acute encephalopathy 08/06/2019  . Chronic hepatitis C without hepatic coma ([place]) 08/17/2017  . Screening examination for venereal disease 05/03/2017  . Encounter for long-term (current) use of high-risk medication 05/03/2017  . Anxiety 09/26/2016  . Substance abuse ([place]) 09/26/2016  . Human immunodeficiency virus (HIV) disease ([place]) 09/02/2014  . History of syphilis 08/25/2014    Past Surgical History:  Procedure Laterality Date  . hand surgery Right   . LUMBAR PUNCTURE  08/08/2019           Family  History  Adopted: Yes  Family history unknown: Yes    Social History   Tobacco Use  . Smoking status: Current Every Day Smoker    Types: Cigarettes  . Smokeless tobacco: Never Used  Substance Use Topics  . Alcohol use: Yes    Comment: 1-2 a week   . Drug use: Yes    Frequency: 14.0 times per week    Types: Marijuana, Methamphetamines    Comment: Heroine    Home Medications Prior to Admission medications   Medication Sig Start Date End Date Taking? Authorizing Provider  bictegravir-emtricitabine-tenofovir AF (BIKTARVY) 50-200-25 MG TABS tablet Take 1 tablet by mouth daily. 08/09/19   Axel Filler, MD  BIKTARVY [phone] MG TABS tablet TAKE 1 TABLET BY MOUTH DAILY 05/01/19   Comer, Okey Regal, MD  levETIRAcetam (KEPPRA) 500 MG tablet Take 1 tablet (500 mg total) by mouth 2 (two) times daily. 08/09/19   Axel Filler, MD  sertraline (ZOLOFT) 25 MG tablet Take 1 tablet (25 mg total) by mouth daily. 08/09/19   Axel Filler, MD  Sofosbuvir-Velpatasvir (EPCLUSA) 400-100 MG TABS Take 1 tablet by mouth daily. 03/08/19   Comer, Okey Regal, MD    Allergies    Patient has no known allergies.  Review of Systems   Review of Systems  Unable to perform ROS: Mental status change    Physical Exam  Updated Vital Signs BP 137/79   Pulse 66   Resp 11   SpO2 100%   Physical Exam Vitals and nursing note reviewed.  Constitutional:      General: He is not in acute distress.    Appearance: He is well-developed.  HENT:     Head: Normocephalic and atraumatic.  Eyes:     Conjunctiva/sclera: Conjunctivae normal.     Pupils: Pupils are equal, round, and reactive to light.  Cardiovascular:     Rate and Rhythm: Normal rate and regular rhythm.     Pulses: Normal pulses.     Heart sounds: No murmur.  Pulmonary:     Effort: Pulmonary effort is normal. Bradypnea present. No respiratory distress.     Breath sounds: Normal breath sounds.  Abdominal:     Palpations: Abdomen is  soft.     Tenderness: There is no abdominal tenderness.  Musculoskeletal:        General: No deformity or signs of injury. Normal range of motion.     Cervical back: Neck supple.  Skin:    General: Skin is warm and dry.     Capillary Refill: Capillary refill takes less than 2 seconds.  Neurological:     Comments: Patient will briefly open eyes to voice.  Withdraws to pain.     ED Results / Procedures / Treatments   Labs (all labs ordered are listed, but only abnormal results are displayed) Labs Reviewed  CBC WITH DIFFERENTIAL/PLATELET - Abnormal; Notable for the following components:      Result Value   Hemoglobin 17.3 (*)    HCT 53.6 (*)    MCV 103.1 (*)    All other components within normal limits  COMPREHENSIVE METABOLIC PANEL - Abnormal; Notable for the following components:   Total Protein 8.3 (*)    AST 82 (*)    ALT 106 (*)    Total Bilirubin 1.3 (*)    All other components within normal limits  ETHANOL  AMMONIA    EKG None  Radiology No results found.  Procedures Procedures (including critical care time)  Medications Ordered in ED Medications  naloxone (NARCAN) injection 0.4 mg (0.2 mg Intravenous Given by Other 08/11/19 2018)  sodium chloride 0.9 % bolus 1,000 mL (0 mLs Intravenous Stopped 08/11/19 2319)    ED Course  I have reviewed the triage vital signs and the nursing notes.  Pertinent labs & imaging results that were available during my care of the patient were reviewed by me and considered in my medical decision making (see chart for details).  Clinical Course as of Aug 11 836  Wynelle Link Aug 11, 2019  2021 Intoxication, overdose, postictal, metabolic derangement, head injury   [MB]  2056 Reevaluated patient, he still unresponsive although respiratory rate is between 7 and 10/min.   [MB]  2206 Reevaluated patient he is now waking up and conversant to voice.  He is not sure what he took tonight to make himself unresponsive.   [MB]  2218 Labs showing  some mild elevations in his AST and ALT although this has been seen before.  Is a history of hepatitis.   [MB]  2310 Patient has remained off of oxygen and satting well and awake and alert.  In all that he needs to be admitted to the hospital.  He said he would call for a ride home.   [MB]    Clinical Course User Index [MB] [name], MD   MDM Rules/Calculators/A&P  Final Clinical Impression(s) / ED Diagnoses Final diagnoses:  Opiate overdose, accidental or unintentional, initial encounter [facility])    Rx / DC Orders ED Discharge Orders    None       [name], MD 08/12/19 [phone]

## 2019-08-21 ENCOUNTER — Emergency Department (HOSPITAL_COMMUNITY)
Admission: EM | Admit: 2019-08-21 | Discharge: 2019-08-21 | Disposition: A | Discharge status: Home / Self Care | Payer: Self-pay | Attending: Emergency Medicine | Admitting: Emergency Medicine

## 2019-08-21 ENCOUNTER — Other Ambulatory Visit: Payer: Self-pay

## 2019-08-21 DIAGNOSIS — R404 Transient alteration of awareness: Secondary | ICD-10-CM | POA: Insufficient documentation

## 2019-08-21 DIAGNOSIS — Z79899 Other long term (current) drug therapy: Secondary | ICD-10-CM | POA: Insufficient documentation

## 2019-08-21 DIAGNOSIS — B20 Human immunodeficiency virus [HIV] disease: Secondary | ICD-10-CM | POA: Insufficient documentation

## 2019-08-21 LAB — CBC WITH DIFFERENTIAL/PLATELET
Abs Immature Granulocytes: 0.02 10*3/uL (ref 0.00–0.07)
Basophils Absolute: 0 10*3/uL (ref 0.0–0.1)
Basophils Relative: 0 %
Eosinophils Absolute: 0 10*3/uL (ref 0.0–0.5)
Eosinophils Relative: 0 %
HCT: 52.4 % — ABNORMAL HIGH (ref 39.0–52.0)
Hemoglobin: 16.8 g/dL (ref 13.0–17.0)
Immature Granulocytes: 0 %
Lymphocytes Relative: 10 %
Lymphs Abs: 0.7 10*3/uL (ref 0.7–4.0)
MCH: 32.2 pg (ref 26.0–34.0)
MCHC: 32.1 g/dL (ref 30.0–36.0)
MCV: 100.4 fL — ABNORMAL HIGH (ref 80.0–100.0)
Monocytes Absolute: 0.4 10*3/uL (ref 0.1–1.0)
Monocytes Relative: 5 %
Neutro Abs: 6.1 10*3/uL (ref 1.7–7.7)
Neutrophils Relative %: 85 %
Platelets: 251 10*3/uL (ref 150–400)
RBC: 5.22 MIL/uL (ref 4.22–5.81)
RDW: 11.8 % (ref 11.5–15.5)
WBC: 7.2 10*3/uL (ref 4.0–10.5)
nRBC: 0 % (ref 0.0–0.2)

## 2019-08-21 LAB — COMPREHENSIVE METABOLIC PANEL
ALT: 59 U/L — ABNORMAL HIGH (ref 0–44)
AST: 38 U/L (ref 15–41)
Albumin: 4.4 g/dL (ref 3.5–5.0)
Alkaline Phosphatase: 89 U/L (ref 38–126)
Anion gap: 8 (ref 5–15)
BUN: 7 mg/dL (ref 6–20)
CO2: 31 mmol/L (ref 22–32)
Calcium: 9.3 mg/dL (ref 8.9–10.3)
Chloride: 102 mmol/L (ref 98–111)
Creatinine, Ser: 1.16 mg/dL (ref 0.61–1.24)
GFR calc Af Amer: >60 mL/min (ref >60)
GFR calc non Af Amer: >60 mL/min (ref >60)
Glucose, Bld: 105 mg/dL — ABNORMAL HIGH (ref 70–99)
Potassium: 3.8 mmol/L (ref 3.5–5.1)
Sodium: 141 mmol/L (ref 135–145)
Total Bilirubin: 1.4 mg/dL — ABNORMAL HIGH (ref 0.3–1.2)
Total Protein: 8.3 g/dL — ABNORMAL HIGH (ref 6.5–8.1)

## 2019-08-21 LAB — ETHANOL: Alcohol, Ethyl (B): <10 mg/dL (ref <10)

## 2019-08-21 LAB — MAGNESIUM: Magnesium: 2.2 mg/dL (ref 1.7–2.4)

## 2019-08-21 MED ORDER — SODIUM CHLORIDE 0.9 % IV BOLUS
500.0000 mL | Freq: Once | INTRAVENOUS | Status: AC
  Administered 2019-08-21: 500 mL via INTRAVENOUS

## 2019-08-21 NOTE — ED Provider Notes (Signed)
[facility] DEPT Provider Note   CSN: [id number] Arrival date & time: 08/21/19  1920     History No chief complaint on file.   Justin [name] is a 39 y.o. male.  HPI    Patient presents after a possible seizure, episode of asked mental status. Patient is awake, alert, states that he had episode of anxiousness, subsequently awoke with EMS providers overhead. EMS providers provide history initially, and the patient subsequently. EMS providers note that the patient was listless on their arrival, with bradypnea.  The provided oxygen and Narcan, the patient had improvement in route.  He also may have received compressions by friend who was a bystander. The patient self notes that he used heroin a few days ago after having been clean for quite some time, but has not used any since that time. He denies substantial pain, does have mild headache, denies weakness in any extremity, confusion, disorientation, fever chills or complaints. Initially the patient notes that has been taking medication regularly, but on repeat exam he notes that he has missed a dose of his antiepileptic medication in the past day, and has missed some of the recently prescribed Zoloft as well.   Past Medical History:  Diagnosis Date  . Endocarditis   . History of syphilis 08/25/2014   Treated by [facility] 07-2014.  [name] HIV (human immunodeficiency virus infection) [facility])     Patient Active Problem List   Diagnosis Date Noted  . Substance use disorder 08/07/2019  . HIV disease ([place]) 08/07/2019  . Acute kidney injury ([place]) 08/07/2019  . Acute encephalopathy 08/06/2019  . Chronic hepatitis C without hepatic coma ([place]) 08/17/2017  . Screening examination for venereal disease 05/03/2017  . Encounter for long-term (current) use of high-risk medication 05/03/2017  . Anxiety 09/26/2016  . Substance abuse ([place]) 09/26/2016  . Human immunodeficiency virus (HIV) disease ([place]) 09/02/2014  . History  of syphilis 08/25/2014    Past Surgical History:  Procedure Laterality Date  . hand surgery Right   . LUMBAR PUNCTURE  08/08/2019           Family History  Adopted: Yes  Family history unknown: Yes    Social History   Tobacco Use  . Smoking status: Current Every Day Smoker    Types: Cigarettes  . Smokeless tobacco: Never Used  Substance Use Topics  . Alcohol use: Yes    Comment: 1-2 a week   . Drug use: Yes    Frequency: 14.0 times per week    Types: Marijuana, Methamphetamines    Comment: Heroine    Home Medications Prior to Admission medications   Medication Sig Start Date End Date Taking? Authorizing Provider  bictegravir-emtricitabine-tenofovir AF (BIKTARVY) 50-200-25 MG TABS tablet Take 1 tablet by mouth daily. 08/09/19  Yes Axel Filler, MD  levETIRAcetam (KEPPRA) 500 MG tablet Take 1 tablet (500 mg total) by mouth 2 (two) times daily. 08/09/19  Yes Axel Filler, MD  sertraline (ZOLOFT) 25 MG tablet Take 1 tablet (25 mg total) by mouth daily. 08/09/19  Yes Axel Filler, MD  BIKTARVY 50-200-25 MG TABS tablet TAKE 1 TABLET BY MOUTH DAILY Patient not taking: Reported on 08/21/2019 05/01/19   Thayer Headings, MD  Sofosbuvir-Velpatasvir (EPCLUSA) 400-100 MG TABS Take 1 tablet by mouth daily. Patient not taking: Reported on 08/21/2019 03/08/19   Thayer Headings, MD    Allergies    Patient has no known allergies.  Review of Systems   Review of Systems  Constitutional:       Per HPI, otherwise negative  HENT:       Per HPI, otherwise negative  Respiratory:       Per HPI, otherwise negative  Cardiovascular:       Per HPI, otherwise negative  Gastrointestinal: Negative for vomiting.  Endocrine:       Negative aside from HPI  Genitourinary:       Neg aside from HPI   Musculoskeletal:       Per HPI, otherwise negative  Skin: Negative.   Allergic/Immunologic: Positive for immunocompromised state.  Neurological: Positive for seizures.  Negative for syncope.    Physical Exam Updated Vital Signs BP (!) 130/98   Pulse 75   Temp 97.9 F (36.6 C) (Oral)   Resp 16   SpO2 96%   Physical Exam Vitals and nursing note reviewed.  Constitutional:      General: He is not in acute distress.    Appearance: He is well-developed.  HENT:     Head: Normocephalic and atraumatic.  Eyes:     Conjunctiva/sclera: Conjunctivae normal.  Cardiovascular:     Rate and Rhythm: Normal rate and regular rhythm.  Pulmonary:     Effort: Pulmonary effort is normal. No respiratory distress.     Breath sounds: No stridor.  Abdominal:     General: There is no distension.  Skin:    General: Skin is warm and dry.  Neurological:     Mental Status: He is alert and oriented to person, place, and time.     ED Results / Procedures / Treatments   Labs (all labs ordered are listed, but only abnormal results are displayed) Labs Reviewed  COMPREHENSIVE METABOLIC PANEL - Abnormal; Notable for the following components:      Result Value   Glucose, Bld 105 (*)    Total Protein 8.3 (*)    ALT 59 (*)    Total Bilirubin 1.4 (*)    All other components within normal limits  CBC WITH DIFFERENTIAL/PLATELET - Abnormal; Notable for the following components:   HCT 52.4 (*)    MCV 100.4 (*)    All other components within normal limits  ETHANOL  MAGNESIUM     Procedures Procedures (including critical care time)  Medications Ordered in ED Medications  sodium chloride 0.9 % bolus 500 mL (500 mLs Intravenous New Bag/Given (Non-Interop) 08/21/19 2019)    ED Course  I have reviewed the triage vital signs and the nursing notes.  Pertinent labs & imaging results that were available during my care of the patient were reviewed by me and considered in my medical decision making (see chart for details).   9:24 PM On repeat exam patient awake, alert, in no distress per We discussed all findings at length. Patient has no ongoing complaints, again  reiterates that he took no new narcotics today, believes that he had a seizure.  MDM Rules/Calculators/A&P                     Patient with history of multiple medical issues including HIV, seizure disorder, recent heroin overdose presents after an episode of altered mental status that the patient believes seizure, but is characteristically more consistent with possible overdose.  On however, here the patient is awake, alert, has unremarkable labs, has hours of monitoring with no notable findings, and after this.  Is appropriate for discharge.  On he and I discussed at length the importance of following up with his physician,  abstaining from illicit substances, and return precautions. Final Clinical Impression(s) / ED Diagnoses Final diagnoses:  Transient alteration of awareness     Gerhard Munch, MD 08/21/19 2126

## 2019-08-21 NOTE — ED Triage Notes (Signed)
Per EMS; Pt is coming from home with c/o overdose. Patients friend found patient with agonal breathing and started CPR . Pt had a pulse upon EMS arrival. Pt does not recall what happened. Pt states he felt as if he was going to have a panic attack and took keppra and zofran. Pt denies taking any other medications. No seizure activity noted. [place] pupils.   EMS VITALS: BP 120/80  RR 6 and is now 20 HR 90 BP 120/80  EMS MEDS 2mg  [place] nasal

## 2019-08-21 NOTE — ED Notes (Signed)
Patient was verbalized discharge instructions. Pt had no further questions at this time. NAD. 

## 2019-08-21 NOTE — Discharge Instructions (Signed)
As discussed, your evaluation today has been largely reassuring.  But, it is important that you monitor your condition carefully, and do not hesitate to return to the ED if you develop new, or concerning changes in your condition. ? ?Otherwise, please follow-up with your physician for appropriate ongoing care. ? ?

## 2019-09-02 ENCOUNTER — Other Ambulatory Visit: Payer: Self-pay | Admitting: Internal Medicine

## 2019-09-05 ENCOUNTER — Other Ambulatory Visit: Payer: Self-pay | Admitting: Student in an Organized Health Care Education/Training Program

## 2019-09-15 ENCOUNTER — Other Ambulatory Visit: Payer: Self-pay | Admitting: Student in an Organized Health Care Education/Training Program

## 2019-09-17 ENCOUNTER — Other Ambulatory Visit: Payer: Self-pay

## 2019-09-26 ENCOUNTER — Telehealth: Payer: Self-pay

## 2019-09-26 NOTE — Telephone Encounter (Signed)
Attempted to call patient to schedule overdue appointment with office. Patient has not been in office since 02/2019. Left voicemail requesting patient call office back for appointment. Lorenso Courier, [place]

## 2019-10-29 ENCOUNTER — Other Ambulatory Visit: Payer: Self-pay | Admitting: *Deleted

## 2019-10-29 ENCOUNTER — Encounter: Payer: Self-pay | Admitting: Internal Medicine

## 2019-10-29 ENCOUNTER — Ambulatory Visit: Payer: Self-pay

## 2019-10-29 ENCOUNTER — Other Ambulatory Visit: Payer: Self-pay

## 2019-10-29 DIAGNOSIS — B20 Human immunodeficiency virus [HIV] disease: Secondary | ICD-10-CM

## 2019-10-30 LAB — T-HELPER CELL (CD4) - (RCID CLINIC ONLY)
CD4 % Helper T Cell: 30 % — ABNORMAL LOW (ref 33–65)
CD4 T Cell Abs: 800 /uL (ref 400–1790)

## 2019-10-31 ENCOUNTER — Emergency Department (HOSPITAL_COMMUNITY)
Admission: EM | Admit: 2019-10-31 | Discharge: 2019-10-31 | Disposition: A | Discharge status: Home / Self Care | Payer: Self-pay | Attending: Emergency Medicine | Admitting: Emergency Medicine

## 2019-10-31 ENCOUNTER — Emergency Department (HOSPITAL_COMMUNITY): Payer: Self-pay

## 2019-10-31 ENCOUNTER — Encounter (HOSPITAL_COMMUNITY): Payer: Self-pay

## 2019-10-31 ENCOUNTER — Other Ambulatory Visit: Payer: Self-pay

## 2019-10-31 DIAGNOSIS — S0081XA Abrasion of other part of head, initial encounter: Secondary | ICD-10-CM | POA: Insufficient documentation

## 2019-10-31 DIAGNOSIS — S80212A Abrasion, left knee, initial encounter: Secondary | ICD-10-CM | POA: Insufficient documentation

## 2019-10-31 DIAGNOSIS — Y999 Unspecified external cause status: Secondary | ICD-10-CM | POA: Insufficient documentation

## 2019-10-31 DIAGNOSIS — S21112A Laceration without foreign body of left front wall of thorax without penetration into thoracic cavity, initial encounter: Secondary | ICD-10-CM | POA: Insufficient documentation

## 2019-10-31 DIAGNOSIS — B182 Chronic viral hepatitis C: Secondary | ICD-10-CM | POA: Insufficient documentation

## 2019-10-31 DIAGNOSIS — S80211A Abrasion, right knee, initial encounter: Secondary | ICD-10-CM | POA: Insufficient documentation

## 2019-10-31 DIAGNOSIS — Y929 Unspecified place or not applicable: Secondary | ICD-10-CM | POA: Insufficient documentation

## 2019-10-31 DIAGNOSIS — Y939 Activity, unspecified: Secondary | ICD-10-CM | POA: Insufficient documentation

## 2019-10-31 DIAGNOSIS — B20 Human immunodeficiency virus [HIV] disease: Secondary | ICD-10-CM | POA: Insufficient documentation

## 2019-10-31 DIAGNOSIS — T148XXA Other injury of unspecified body region, initial encounter: Secondary | ICD-10-CM

## 2019-10-31 HISTORY — DX: Unspecified viral hepatitis C without hepatic coma: B19.20

## 2019-10-31 MED ORDER — LIDOCAINE-EPINEPHRINE (PF) 2 %-1:200000 IJ SOLN
10.0000 mL | Freq: Once | INTRAMUSCULAR | Status: AC
  Administered 2019-10-31: 10 mL via INTRADERMAL
  Filled 2019-10-31: qty 20
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
## 2019-10-31 NOTE — ED Notes (Signed)
Pt climbing out of bed, removing his own IV, removing his leads and monitoring equipment and stating he is ready to leave. Pt is naked and walking out of room. Offered pants, pt stated "I am ready to go". Pt wrapped blanket around self and ambulated w/ steady gait to lobby. Once in lobby, again confirmed " you would like to walk out of here like this?" Pt stated "I have no choice" Again advised pt he could come back and wait for sutures. [employer] then spoke to pt and ambulated w/ him back to room. Pt then accepted pants at that time
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
## 2019-11-01 ENCOUNTER — Encounter (HOSPITAL_COMMUNITY): Payer: Self-pay

## 2019-11-01 LAB — COMPLETE METABOLIC PANEL WITH GFR
AG Ratio: 1.6 (calc) (ref 1.0–2.5)
ALT: 22 U/L (ref 9–46)
AST: 25 U/L (ref 10–40)
Albumin: 4 g/dL (ref 3.6–5.1)
Alkaline phosphatase (APISO): 94 U/L (ref 36–130)
BUN: 7 mg/dL (ref 7–25)
CO2: 30 mmol/L (ref 20–32)
Calcium: 9.4 mg/dL (ref 8.6–10.3)
Chloride: 106 mmol/L (ref 98–110)
Creat: 0.91 mg/dL (ref 0.60–1.35)
GFR, Est African American: 123 mL/min/{1.73_m2} (ref >=60)
GFR, Est Non African American: 107 mL/min/{1.73_m2} (ref >=60)
Globulin: 2.5 g/dL (calc) (ref 1.9–3.7)
Glucose, Bld: 104 mg/dL — ABNORMAL HIGH (ref 65–99)
Potassium: 4.4 mmol/L (ref 3.5–5.3)
Sodium: 143 mmol/L (ref 135–146)
Total Bilirubin: 0.6 mg/dL (ref 0.2–1.2)
Total Protein: 6.5 g/dL (ref 6.1–8.1)

## 2019-11-01 LAB — CBC WITH DIFFERENTIAL/PLATELET
Absolute Monocytes: 763 cells/uL (ref 200–950)
Basophils Absolute: 28 cells/uL (ref 0–200)
Basophils Relative: 0.4 %
Eosinophils Absolute: 112 cells/uL (ref 15–500)
Eosinophils Relative: 1.6 %
HCT: 41.2 % (ref 38.5–50.0)
Hemoglobin: 14.2 g/dL (ref 13.2–17.1)
Lymphs Abs: 2695 cells/uL (ref 850–3900)
MCH: 32.3 pg (ref 27.0–33.0)
MCHC: 34.5 g/dL (ref 32.0–36.0)
MCV: 93.8 fL (ref 80.0–100.0)
MPV: 10.1 fL (ref 7.5–12.5)
Monocytes Relative: 10.9 %
Neutro Abs: 3402 cells/uL (ref 1500–7800)
Neutrophils Relative %: 48.6 %
Platelets: 255 10*3/uL (ref 140–400)
RBC: 4.39 10*6/uL (ref 4.20–5.80)
RDW: 11.3 % (ref 11.0–15.0)
Total Lymphocyte: 38.5 %
WBC: 7 10*3/uL (ref 3.8–10.8)

## 2019-11-01 LAB — HIV-1 RNA QUANT-NO REFLEX-BLD
HIV 1 RNA Quant: <20 copies/mL
HIV-1 RNA Quant, Log: <1.3 Log copies/mL

## 2019-11-14 ENCOUNTER — Encounter: Payer: Self-pay | Admitting: Internal Medicine

## 2019-11-17 ENCOUNTER — Emergency Department (HOSPITAL_COMMUNITY): Payer: No Typology Code available for payment source

## 2019-11-17 ENCOUNTER — Encounter (HOSPITAL_COMMUNITY): Payer: Self-pay | Admitting: Emergency Medicine

## 2019-11-17 ENCOUNTER — Other Ambulatory Visit: Payer: Self-pay

## 2019-11-17 ENCOUNTER — Emergency Department (HOSPITAL_COMMUNITY)
Admission: EM | Admit: 2019-11-17 | Discharge: 2019-11-17 | Disposition: A | Discharge status: Home / Self Care | Payer: No Typology Code available for payment source | Attending: Emergency Medicine | Admitting: Emergency Medicine

## 2019-11-17 ENCOUNTER — Other Ambulatory Visit: Payer: Self-pay | Admitting: Student in an Organized Health Care Education/Training Program

## 2019-11-17 DIAGNOSIS — F10920 Alcohol use, unspecified with intoxication, uncomplicated: Secondary | ICD-10-CM | POA: Insufficient documentation

## 2019-11-17 DIAGNOSIS — Y92410 Unspecified street and highway as the place of occurrence of the external cause: Secondary | ICD-10-CM | POA: Diagnosis not present

## 2019-11-17 DIAGNOSIS — Y906 Blood alcohol level of 120-199 mg/100 ml: Secondary | ICD-10-CM | POA: Diagnosis not present

## 2019-11-17 DIAGNOSIS — Y9389 Activity, other specified: Secondary | ICD-10-CM | POA: Insufficient documentation

## 2019-11-17 DIAGNOSIS — S0990XA Unspecified injury of head, initial encounter: Secondary | ICD-10-CM | POA: Diagnosis present

## 2019-11-17 DIAGNOSIS — Y998 Other external cause status: Secondary | ICD-10-CM | POA: Diagnosis not present

## 2019-11-17 DIAGNOSIS — M25512 Pain in left shoulder: Secondary | ICD-10-CM | POA: Diagnosis not present

## 2019-11-17 DIAGNOSIS — Z23 Encounter for immunization: Secondary | ICD-10-CM | POA: Insufficient documentation

## 2019-11-17 DIAGNOSIS — S0081XA Abrasion of other part of head, initial encounter: Secondary | ICD-10-CM | POA: Diagnosis not present

## 2019-11-17 LAB — PROTIME-INR
INR: 0.9 (ref 0.8–1.2)
Prothrombin Time: 12.2 seconds (ref 11.4–15.2)

## 2019-11-17 LAB — CBC WITH DIFFERENTIAL/PLATELET
Abs Immature Granulocytes: 0 10*3/uL (ref 0.00–0.07)
Basophils Absolute: 0 10*3/uL (ref 0.0–0.1)
Basophils Relative: 1 %
Eosinophils Absolute: 0.1 10*3/uL (ref 0.0–0.5)
Eosinophils Relative: 1 %
HCT: 44.2 % (ref 39.0–52.0)
Hemoglobin: 14.5 g/dL (ref 13.0–17.0)
Immature Granulocytes: 0 %
Lymphocytes Relative: 44 %
Lymphs Abs: 3 10*3/uL (ref 0.7–4.0)
MCH: 33.3 pg (ref 26.0–34.0)
MCHC: 32.8 g/dL (ref 30.0–36.0)
MCV: 101.6 fL — ABNORMAL HIGH (ref 80.0–100.0)
Monocytes Absolute: 0.5 10*3/uL (ref 0.1–1.0)
Monocytes Relative: 7 %
Neutro Abs: 3.3 10*3/uL (ref 1.7–7.7)
Neutrophils Relative %: 47 %
Platelets: 256 10*3/uL (ref 150–400)
RBC: 4.35 MIL/uL (ref 4.22–5.81)
RDW: 13.6 % (ref 11.5–15.5)
WBC: 7 10*3/uL (ref 4.0–10.5)
nRBC: 0 % (ref 0.0–0.2)

## 2019-11-17 LAB — I-STAT CHEM 8, ED
BUN: 8 mg/dL (ref 6–20)
Calcium, Ion: 1.09 mmol/L — ABNORMAL LOW (ref 1.15–1.40)
Chloride: 109 mmol/L (ref 98–111)
Creatinine, Ser: 1.2 mg/dL (ref 0.61–1.24)
Glucose, Bld: 89 mg/dL (ref 70–99)
HCT: 41 % (ref 39.0–52.0)
Hemoglobin: 13.9 g/dL (ref 13.0–17.0)
Potassium: 3.4 mmol/L — ABNORMAL LOW (ref 3.5–5.1)
Sodium: 145 mmol/L (ref 135–145)
TCO2: 25 mmol/L (ref 22–32)

## 2019-11-17 LAB — RAPID URINE DRUG SCREEN, HOSP PERFORMED
Amphetamines: NOT DETECTED
Barbiturates: NOT DETECTED
Benzodiazepines: NOT DETECTED
Cocaine: NOT DETECTED
Opiates: NOT DETECTED
Tetrahydrocannabinol: NOT DETECTED

## 2019-11-17 LAB — COMPREHENSIVE METABOLIC PANEL
ALT: 22 U/L (ref 0–44)
AST: 29 U/L (ref 15–41)
Albumin: 3.9 g/dL (ref 3.5–5.0)
Alkaline Phosphatase: 83 U/L (ref 38–126)
Anion gap: 10 (ref 5–15)
BUN: 7 mg/dL (ref 6–20)
CO2: 24 mmol/L (ref 22–32)
Calcium: 9 mg/dL (ref 8.9–10.3)
Chloride: 110 mmol/L (ref 98–111)
Creatinine, Ser: 0.99 mg/dL (ref 0.61–1.24)
GFR calc Af Amer: >60 mL/min (ref >60)
GFR calc non Af Amer: >60 mL/min (ref >60)
Glucose, Bld: 90 mg/dL (ref 70–99)
Potassium: 4 mmol/L (ref 3.5–5.1)
Sodium: 144 mmol/L (ref 135–145)
Total Bilirubin: 0.7 mg/dL (ref 0.3–1.2)
Total Protein: 7.3 g/dL (ref 6.5–8.1)

## 2019-11-17 LAB — ETHANOL: Alcohol, Ethyl (B): 138 mg/dL — ABNORMAL HIGH (ref <10)

## 2019-11-17 MED ORDER — TETANUS-DIPHTHERIA TOXOIDS TD 5-2 LFU IM INJ
0.5000 mL | INJECTION | Freq: Once | INTRAMUSCULAR | Status: AC
  Administered 2019-11-17: 0.5 mL via INTRAMUSCULAR
  Filled 2019-11-17: qty 0.5

## 2019-11-17 MED ORDER — IOHEXOL 300 MG/ML  SOLN
100.0000 mL | Freq: Once | INTRAMUSCULAR | Status: AC | PRN
  Administered 2019-11-17: 100 mL via INTRAVENOUS

## 2019-11-17 NOTE — ED Notes (Signed)
Patient verbalizes understanding of discharge instructions. Opportunity for questioning and answers were provided. Armband removed by staff, pt discharged from ED and ambulated to lobby to wait for ride.

## 2019-11-17 NOTE — ED Notes (Signed)
PT transported to CT>
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
## 2019-11-17 NOTE — Discharge Instructions (Addendum)
Please return for any problem.  Follow-up with your regular care provider as instructed.  Treat your pain with ice and Tylenol as instructed.  Drink alcohol in moderation.

## 2019-11-18 ENCOUNTER — Encounter (HOSPITAL_COMMUNITY): Payer: Self-pay

## 2019-11-18 LAB — TYPE AND SCREEN
ABO/RH(D): O POS
Antibody Screen: NEGATIVE

## 2019-11-18 LAB — ABO/RH: ABO/RH(D): O POS
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
## 2019-11-29 ENCOUNTER — Emergency Department (HOSPITAL_COMMUNITY)
Admission: EM | Admit: 2019-11-29 | Discharge: 2019-12-06 | Disposition: A | Discharge status: Home / Self Care | Payer: PRIVATE HEALTH INSURANCE | Attending: Emergency Medicine | Admitting: Emergency Medicine

## 2019-11-29 ENCOUNTER — Emergency Department (HOSPITAL_COMMUNITY): Payer: PRIVATE HEALTH INSURANCE

## 2019-11-29 ENCOUNTER — Encounter (HOSPITAL_COMMUNITY): Payer: Self-pay | Admitting: Emergency Medicine

## 2019-11-29 ENCOUNTER — Other Ambulatory Visit: Payer: Self-pay

## 2019-11-29 DIAGNOSIS — F1520 Other stimulant dependence, uncomplicated: Secondary | ICD-10-CM | POA: Insufficient documentation

## 2019-11-29 DIAGNOSIS — Y939 Activity, unspecified: Secondary | ICD-10-CM | POA: Insufficient documentation

## 2019-11-29 DIAGNOSIS — Y929 Unspecified place or not applicable: Secondary | ICD-10-CM | POA: Insufficient documentation

## 2019-11-29 DIAGNOSIS — Z20822 Contact with and (suspected) exposure to covid-19: Secondary | ICD-10-CM | POA: Insufficient documentation

## 2019-11-29 DIAGNOSIS — R0789 Other chest pain: Secondary | ICD-10-CM | POA: Insufficient documentation

## 2019-11-29 DIAGNOSIS — F1020 Alcohol dependence, uncomplicated: Secondary | ICD-10-CM | POA: Insufficient documentation

## 2019-11-29 DIAGNOSIS — Y999 Unspecified external cause status: Secondary | ICD-10-CM | POA: Insufficient documentation

## 2019-11-29 DIAGNOSIS — S92512A Displaced fracture of proximal phalanx of left lesser toe(s), initial encounter for closed fracture: Secondary | ICD-10-CM | POA: Insufficient documentation

## 2019-11-29 DIAGNOSIS — F333 Major depressive disorder, recurrent, severe with psychotic symptoms: Secondary | ICD-10-CM | POA: Insufficient documentation

## 2019-11-29 DIAGNOSIS — R45851 Suicidal ideations: Secondary | ICD-10-CM

## 2019-11-29 DIAGNOSIS — M542 Cervicalgia: Secondary | ICD-10-CM | POA: Insufficient documentation

## 2019-11-29 DIAGNOSIS — R519 Headache, unspecified: Secondary | ICD-10-CM | POA: Insufficient documentation

## 2019-11-29 LAB — RAPID URINE DRUG SCREEN, HOSP PERFORMED
Amphetamines: NOT DETECTED
Barbiturates: NOT DETECTED
Benzodiazepines: NOT DETECTED
Cocaine: NOT DETECTED
Opiates: NOT DETECTED
Tetrahydrocannabinol: POSITIVE — AB

## 2019-11-29 LAB — COMPREHENSIVE METABOLIC PANEL
ALT: 21 U/L (ref 0–44)
AST: 25 U/L (ref 15–41)
Albumin: 4 g/dL (ref 3.5–5.0)
Alkaline Phosphatase: 83 U/L (ref 38–126)
Anion gap: 9 (ref 5–15)
BUN: 8 mg/dL (ref 6–20)
CO2: 26 mmol/L (ref 22–32)
Calcium: 9.1 mg/dL (ref 8.9–10.3)
Chloride: 107 mmol/L (ref 98–111)
Creatinine, Ser: 0.99 mg/dL (ref 0.61–1.24)
GFR calc Af Amer: >60 mL/min (ref >60)
GFR calc non Af Amer: >60 mL/min (ref >60)
Glucose, Bld: 99 mg/dL (ref 70–99)
Potassium: 4.3 mmol/L (ref 3.5–5.1)
Sodium: 142 mmol/L (ref 135–145)
Total Bilirubin: 0.9 mg/dL (ref 0.3–1.2)
Total Protein: 6.7 g/dL (ref 6.5–8.1)

## 2019-11-29 LAB — CBC
HCT: 44 % (ref 39.0–52.0)
Hemoglobin: 14.2 g/dL (ref 13.0–17.0)
MCH: 32.4 pg (ref 26.0–34.0)
MCHC: 32.3 g/dL (ref 30.0–36.0)
MCV: 100.5 fL — ABNORMAL HIGH (ref 80.0–100.0)
Platelets: 228 10*3/uL (ref 150–400)
RBC: 4.38 MIL/uL (ref 4.22–5.81)
RDW: 13 % (ref 11.5–15.5)
WBC: 5.9 10*3/uL (ref 4.0–10.5)
nRBC: 0 % (ref 0.0–0.2)

## 2019-11-29 LAB — SALICYLATE LEVEL: Salicylate Lvl: <7 mg/dL — ABNORMAL LOW (ref 7.0–30.0)

## 2019-11-29 LAB — ETHANOL: Alcohol, Ethyl (B): <10 mg/dL (ref <10)

## 2019-11-29 LAB — ACETAMINOPHEN LEVEL: Acetaminophen (Tylenol), Serum: <10 ug/mL — ABNORMAL LOW (ref 10–30)

## 2019-11-29 MED ORDER — IOHEXOL 300 MG/ML  SOLN
75.0000 mL | Freq: Once | INTRAMUSCULAR | Status: AC | PRN
  Administered 2019-11-29: 75 mL via INTRAVENOUS

## 2019-11-29 MED ORDER — ACETAMINOPHEN 325 MG PO TABS
650.0000 mg | ORAL_TABLET | ORAL | Status: DC | PRN
  Administered 2019-11-30 – 2019-12-05 (×9): 650 mg via ORAL
  Filled 2019-11-29 (×9): qty 2

## 2019-11-29 MED ORDER — LEVETIRACETAM 500 MG PO TABS
500.0000 mg | ORAL_TABLET | Freq: Two times a day (BID) | ORAL | Status: DC
  Administered 2019-11-30 – 2019-12-06 (×14): 500 mg via ORAL
  Filled 2019-11-29 (×14): qty 1

## 2019-11-29 MED ORDER — ACETAMINOPHEN 500 MG PO TABS
1000.0000 mg | ORAL_TABLET | Freq: Once | ORAL | Status: AC
  Administered 2019-11-29: 1000 mg via ORAL
  Filled 2019-11-29: qty 2
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
## 2019-11-29 NOTE — Progress Notes (Signed)
Orthopedic Tech Progress Note Patient Details:  Justin [name] 02/07/1981 [id number]  Ortho Devices Type of Ortho Device: Buddy tape Ortho Device/Splint Location: LLE Ortho Device/Splint Interventions: Application   Post Interventions Patient Tolerated: Well   [name] [name] 11/29/2019, 10:43 PM

## 2019-11-29 NOTE — ED Notes (Signed)
Pt to imaging
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
## 2019-11-30 ENCOUNTER — Other Ambulatory Visit: Payer: Self-pay

## 2019-11-30 LAB — SARS CORONAVIRUS 2 BY RT PCR (HOSPITAL ORDER, PERFORMED IN ~~LOC~~ HOSPITAL LAB): SARS Coronavirus 2: NEGATIVE

## 2019-11-30 MED ORDER — STERILE WATER FOR INJECTION IJ SOLN
INTRAMUSCULAR | Status: AC
  Administered 2019-11-30: 1.2 mL
  Filled 2019-11-30: qty 10

## 2019-11-30 MED ORDER — BICTEGRAVIR-EMTRICITAB-TENOFOV 50-200-25 MG PO TABS
1.0000 | ORAL_TABLET | Freq: Every day | ORAL | Status: DC
  Administered 2019-11-30 – 2019-12-06 (×7): 1 via ORAL
  Filled 2019-11-30 (×7): qty 1

## 2019-11-30 MED ORDER — LORAZEPAM 1 MG PO TABS
1.0000 mg | ORAL_TABLET | ORAL | Status: AC | PRN
  Administered 2019-11-30: 1 mg via ORAL
  Filled 2019-11-30: qty 1

## 2019-11-30 MED ORDER — SERTRALINE HCL 25 MG PO TABS
25.0000 mg | ORAL_TABLET | Freq: Every day | ORAL | Status: DC
  Administered 2019-11-30 – 2019-12-03 (×4): 25 mg via ORAL
  Filled 2019-11-30 (×4): qty 1

## 2019-11-30 MED ORDER — ZIPRASIDONE MESYLATE 20 MG IM SOLR
20.0000 mg | Freq: Two times a day (BID) | INTRAMUSCULAR | Status: DC | PRN
  Administered 2019-11-30: 20 mg via INTRAMUSCULAR
  Filled 2019-11-30: qty 20

## 2019-11-30 NOTE — ED Notes (Signed)
Ordered breakfast--Justin [name] 
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
## 2019-11-30 NOTE — ED Notes (Addendum)
Pt in shower. Bed traded out from stretcher for comfort. ALL Belongings inventoried - 1 Labeled Belongings Bag - Locker #10 - No Valuables Envelope - I-Pod in bag.

## 2019-11-30 NOTE — ED Notes (Addendum)
Pt asking to see the nurse so that the RN can explain to him what it means for him to be seeing the words = "leave" "kill". Pt then asked RN if she is the one telling him these words. States he began experiencing hallucinations last pm. States he has not ever experienced them before. Pt then began crying - no tears noted. Post-op shoe given for left foot d/t injury and ice pack also given. Pt has been asking for urinal - pt ambulated to bathroom as directed x 2 this am thus far. Pt noted w/limp d/t foot injury. BH SW aware and will advise BH NP so psych meds may be ordered. Pt also had asked earlier for HIV med - received order.

## 2019-11-30 NOTE — ED Notes (Signed)
IVC in process  

## 2019-11-30 NOTE — ED Notes (Signed)
States he did not get enough food for breakfast - pt being given snack as requested.

## 2019-11-30 NOTE — ED Notes (Signed)
Pt arrived to Rm 51 via stretcher - wearing burgundy scrubs - Sitter w/pt.

## 2019-11-30 NOTE — ED Notes (Signed)
Pt attempted to leave ED - ambulated to Exit door - Pt escorted back to room. Pt states he is seeing the words "Kill" "Leave" "Make it happen". States he remains SI - "I will make it happen when I leave".

## 2019-11-30 NOTE — ED Notes (Signed)
Pt given coffee and graham crackers as requested - voiced understanding of Medical Clearance Pt Policy - copy of form given. Pt asking for special meals to be ordered - "High calorie" - advised pt house trays only and that he may have extra snacks at snack times - voiced understanding.

## 2019-11-30 NOTE — ED Notes (Signed)
Pt verbalized understanding IVC'd - States he was not aware he could not leave but now he is and is agreement to "follow any rules you may have". IVC paperwork - Copy faxed to [facility] - Copy sent to Medical Records - Original placed in folder for Magistrate - ALL 3 sets on clipboard.

## 2019-11-30 NOTE — ED Notes (Signed)
Pt given sandwich 

## 2019-11-30 NOTE — Progress Notes (Signed)
Patient meets criteria for inpatient treatment. There are no available beds at [facility] CSW faxed referrals to the following facilities for review:  [facility] [address] [place] [facility]  TTS will continue to seek bed placement.   Trula Slade, MSW, LCSW Clinical Social Worker 11/30/2019 8:34 AM

## 2019-12-01 MED ORDER — LORAZEPAM 1 MG PO TABS
1.0000 mg | ORAL_TABLET | Freq: Once | ORAL | Status: AC | PRN
  Administered 2019-12-01: 1 mg via ORAL
  Filled 2019-12-01: qty 1

## 2019-12-01 NOTE — ED Notes (Signed)
IVC-SI-Inpt  Breakfast Ordered

## 2019-12-01 NOTE — ED Notes (Signed)
Pt attempted to be argumentative w/staff - refusing to wear face mask when ambulated to bathroom and then closed the door to room after being asked not to do so. Re-TTS being performed.

## 2019-12-01 NOTE — ED Provider Notes (Signed)
Pt labs, vitals, and RN notes reviewed.   In brief, pt presenting for SI and hallucinations. IVC'd yesterday while attempting to leave. BH team recommends inpatient, placement pending.     Alveria Apley, PA-C 12/01/19 1155    Tilden Fossa, MD 12/01/19 [phone]

## 2019-12-01 NOTE — ED Notes (Signed)
Pt requesting geodon shot that we discussed earlier. States that the voices have gotten worse. Asking this RN to explain the voices to him. This RN stated to pt that I am unable to explain the voices that he is hearing, but he is in a safe place and that no one is going to hurt him. Pt cooperative at this time. Pt returned to his room, looking under bed "for whoever is talking to me".

## 2019-12-01 NOTE — ED Notes (Signed)
Pt asking for Ambien to help him sleep. Dr Juleen China aware - order received for Ativan d/t pt has advised experiences AVH intermittently so no Ambien. Pt voiced understanding this is a one-time order. Pt eating snack given.

## 2019-12-01 NOTE — BH Assessment (Signed)
[facility] Assessment Progress Note   Patient was seen for re-assessment.  He states that he was given medication last night to help him sleep because he states that the voices were really getting loud.  Patient states that he is tired of living the way he is living and being on the streets and not having any resources.  He states that he wants to start his life over, but if he continues to have to live like he is living, that he has thought about overdosing or walking in front of a bus.  He states that he cannot continue going on this way.  Patient states that he has friends in [place] that would help him if he could just get there.  However, he states that in [place] that he has not been able to function.  Patient states that he saw his family get killed in his mind, but he does not know if it was a delusion or if it really happened.  Patient states, "I just need assistance in getting the help that I need."  TTS staffed case with Hillery Jacks, NP who continues to recommend inpatient treatment.

## 2019-12-01 NOTE — ED Notes (Signed)
Pt states that he is hearing voices. "Leave, hurt, kill, run" "Are you saying them" "they are whispered in my ear". Assured pt that this RN and other staff are not saying these words to pt. Pt requesting something to help with anxiety. "I need some of the Ativan I had earlier" Told pt that he does not have an order for that. Pt polite and cooperative at this time.

## 2019-12-02 MED ORDER — HYDROXYZINE HCL 25 MG PO TABS
25.0000 mg | ORAL_TABLET | Freq: Four times a day (QID) | ORAL | Status: DC | PRN
  Administered 2019-12-02 – 2019-12-03 (×2): 25 mg via ORAL
  Filled 2019-12-02 (×2): qty 1

## 2019-12-02 MED ORDER — ARIPIPRAZOLE 5 MG PO TABS
5.0000 mg | ORAL_TABLET | Freq: Every day | ORAL | Status: DC
  Administered 2019-12-02 – 2019-12-06 (×5): 5 mg via ORAL
  Filled 2019-12-02 (×5): qty 1

## 2019-12-02 NOTE — ED Notes (Signed)
Pt talking on phone at nurses station. 
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
## 2019-12-02 NOTE — ED Notes (Signed)
Ordered breakfast 

## 2019-12-02 NOTE — Progress Notes (Signed)
Patient ID: Justin [name], male   DOB: Nov 09, 1980, 39 y.o.   MRN: [id number]  Medication recommendation  Case discussed during [facility] treatment team this morning and medications recommended to manage mental health symptoms described as depression,  auditory hallucinations, and paranoia. I will order Abilify 5 mg po daily to mange psychosis and to augment Zoloft for depression. Reviewed EKG and there are no concerns with QtTc ( 468). Patient continues to meet criteria for inpatient psychiatric treatment. CSW will follow-up on referrals as there is no appropriate bed available here at [facility].

## 2019-12-02 NOTE — ED Notes (Signed)
Pt taking a shower 

## 2019-12-03 MED ORDER — SERTRALINE HCL 50 MG PO TABS
50.0000 mg | ORAL_TABLET | Freq: Every day | ORAL | Status: DC
  Administered 2019-12-04 – 2019-12-06 (×3): 50 mg via ORAL
  Filled 2019-12-03 (×3): qty 1

## 2019-12-03 MED ORDER — HYDROXYZINE HCL 25 MG PO TABS
25.0000 mg | ORAL_TABLET | Freq: Four times a day (QID) | ORAL | Status: DC | PRN
  Administered 2019-12-04 – 2019-12-05 (×4): 25 mg via ORAL
  Filled 2019-12-03 (×4): qty 1

## 2019-12-03 NOTE — ED Notes (Signed)
Lunch Tray Ordered 

## 2019-12-03 NOTE — ED Notes (Signed)
Per Staffing Office, no Sitter available for pt. Pt being monitored by staff. Pt aware of tx plan - continuing to seek Inpt and order for Zoloft increased to 50mg . Pt voiced appreciation and understanding.

## 2019-12-03 NOTE — ED Notes (Signed)
Patient denies pain and is resting comfortably.  

## 2019-12-03 NOTE — ED Notes (Signed)
Notified [facility] pt did not receive lunch tray. Pt aware advised they will "expedite" it.

## 2019-12-03 NOTE — ED Notes (Signed)
Sitter arrived to bedside. Pt noted to be lying on bed w/eyes closed. Respirations even, unlabored.
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
## 2019-12-03 NOTE — Progress Notes (Signed)
Pt continues to meet inpatient criteria. Referral information has been sent to the following hospitals for review:  [facility]  Disposition will continue to follow.   Wells Guiles, LCSW, LCAS Disposition CSW [facility]/TTS [phone]
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
## 2019-12-04 MED ORDER — ZOLPIDEM TARTRATE 5 MG PO TABS
5.0000 mg | ORAL_TABLET | Freq: Every evening | ORAL | Status: DC | PRN
  Administered 2019-12-04 – 2019-12-05 (×2): 5 mg via ORAL
  Filled 2019-12-04 (×2): qty 1
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
## 2019-12-04 NOTE — ED Notes (Signed)
Per staffing office, no sitter available. Charge nurse aware. Pt being monitored by staff. Pt cooperative and calm. Very nice.

## 2019-12-04 NOTE — BH Assessment (Signed)
Re-faxed referrals to the following facilities for consideration of a bed.    [facility] Details       [facility] Details       [facility] Details       [facility] Details       [facility] Details       [facility] Details       [facility] Details       [facility] Details       [facility] Details       [facility] Details       [facility] Details       [facility] Details       [facility] Details       [facility] Details       [facility] Details       [facility] Details       [facility] Details       [facility] Details       [facility] Details       [facility] Details       [facility]

## 2019-12-04 NOTE — ED Notes (Signed)
Patient at nurses station to use phone.

## 2019-12-04 NOTE — ED Notes (Signed)
Breakfast ordered 

## 2019-12-04 NOTE — ED Notes (Signed)
Lunch Tray Ordered @ 1044.  

## 2019-12-05 NOTE — ED Notes (Signed)
Pt discussed that he does think inpatient will be most beneficial for him. Has not been taking his zoloft regularly. He is willing to wait patiently for a bed.
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
## 2019-12-05 NOTE — ED Notes (Signed)
Breakfast ordered 

## 2019-12-05 NOTE — ED Notes (Signed)
TC to [facility]  For information on bed for Pt.

## 2019-12-05 NOTE — ED Notes (Signed)
lunch tray ordered

## 2019-12-05 NOTE — ED Notes (Signed)
Pt outside having outside time with sitter.

## 2019-12-05 NOTE — ED Notes (Signed)
IVC-SI-Inpt Dinner ordered

## 2019-12-05 NOTE — Care Management (Signed)
No appropriate beds at [facility] presently.   Patient has been referred to the following facilities: Re-faxed referrals to the following facilities for consideration of a bed.    [facility] Details       [facility] Details       [facility] Details       [facility] Details       [facility] Details       [facility] Details       [facility] Details       [facility] Details       [facility] Details       [facility] Details       [facility] Details       [facility] Details       [facility] Details       [facility] Details       [facility] Details       [facility] Details       [facility] Details       [facility] Details       [facility] Details       [facility] Details       [facility]

## 2019-12-06 MED ORDER — SERTRALINE HCL 50 MG PO TABS: 50.0000 mg | ORAL_TABLET | Freq: Every day | ORAL | 0 refills | Status: DC

## 2019-12-06 MED ORDER — ARIPIPRAZOLE 5 MG PO TABS
5.0000 mg | ORAL_TABLET | Freq: Every day | ORAL | 0 refills | Status: DC
Start: 2019-12-06 — End: 2020-06-27

## 2019-12-06 MED FILL — ARIPiprazole 5 MG TABS: 5 | 30 days supply | Qty: 30 | Fill #0

## 2019-12-06 MED FILL — SERTRALINE HCL 50 MG TABLET: 50 | 30 days supply | Qty: 30 | Fill #0

## 2019-12-06 NOTE — ED Notes (Signed)
PT given 30 day supply of ambilify and zoloft from [facility] pharmacy.
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
## 2019-12-06 NOTE — ED Notes (Signed)
Lunch Tray Ordered @ 1048. °
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
## 2019-12-06 NOTE — Progress Notes (Signed)
CSW and RN CM obtained clothing for patient along with his medications from [facility] pharmacy, in addition to homeless resources - all were given to Lanora Manis, [place] for the patient.  Edwin Dada, MSW, LCSW-A Transitions of Care  Clinical Social Worker  [facility] Emergency Departments  Medical ICU [phone]

## 2019-12-12 ENCOUNTER — Other Ambulatory Visit: Payer: Self-pay

## 2019-12-12 ENCOUNTER — Emergency Department (HOSPITAL_COMMUNITY)
Admission: EM | Admit: 2019-12-12 | Discharge: 2019-12-12 | Disposition: A | Discharge status: Home / Self Care | Payer: PRIVATE HEALTH INSURANCE | Attending: Emergency Medicine | Admitting: Emergency Medicine

## 2019-12-12 ENCOUNTER — Emergency Department (HOSPITAL_COMMUNITY): Payer: PRIVATE HEALTH INSURANCE

## 2019-12-12 DIAGNOSIS — S61214A Laceration without foreign body of right ring finger without damage to nail, initial encounter: Secondary | ICD-10-CM

## 2019-12-12 DIAGNOSIS — W230XXA Caught, crushed, jammed, or pinched between moving objects, initial encounter: Secondary | ICD-10-CM | POA: Insufficient documentation

## 2019-12-12 DIAGNOSIS — S61212A Laceration without foreign body of right middle finger without damage to nail, initial encounter: Secondary | ICD-10-CM | POA: Insufficient documentation

## 2019-12-12 DIAGNOSIS — Y929 Unspecified place or not applicable: Secondary | ICD-10-CM | POA: Insufficient documentation

## 2019-12-12 DIAGNOSIS — S51011A Laceration without foreign body of right elbow, initial encounter: Secondary | ICD-10-CM | POA: Insufficient documentation

## 2019-12-12 DIAGNOSIS — B20 Human immunodeficiency virus [HIV] disease: Secondary | ICD-10-CM | POA: Insufficient documentation

## 2019-12-12 DIAGNOSIS — F17210 Nicotine dependence, cigarettes, uncomplicated: Secondary | ICD-10-CM | POA: Insufficient documentation

## 2019-12-12 DIAGNOSIS — Y999 Unspecified external cause status: Secondary | ICD-10-CM | POA: Insufficient documentation

## 2019-12-12 DIAGNOSIS — Y9389 Activity, other specified: Secondary | ICD-10-CM | POA: Insufficient documentation

## 2019-12-12 MED ORDER — MORPHINE SULFATE (PF) 4 MG/ML IV SOLN
4.0000 mg | Freq: Once | INTRAVENOUS | Status: AC
  Administered 2019-12-12: 4 mg via INTRAVENOUS
  Filled 2019-12-12: qty 1

## 2019-12-12 MED ORDER — ONDANSETRON HCL 4 MG/2ML IJ SOLN
4.0000 mg | Freq: Once | INTRAMUSCULAR | Status: AC
  Administered 2019-12-12: 4 mg via INTRAVENOUS
  Filled 2019-12-12: qty 2

## 2019-12-12 MED ORDER — BUPIVACAINE HCL 0.5 % IJ SOLN
20.0000 mL | Freq: Once | INTRAMUSCULAR | Status: DC
  Filled 2019-12-12: qty 20

## 2019-12-12 NOTE — Discharge Instructions (Signed)
Please go straight to Dr. Carollee Massed office for evaluation.

## 2019-12-12 NOTE — Consult Note (Signed)
Reason for Consult:Right hand tendon injury Referring Physician: Thomes Lolling is an 39 y.o. male.  HPI: Justin [name] had his hand slammed in a door last night. He had significant lacerations and came to the ED for evaluation. He was noted to have a likely flexor tendon injury and hand surgery was consulted. He is RHD and currently unemployed.  Past Medical History:  Diagnosis Date  . Endocarditis   . Hepatitis C   . History of syphilis 08/25/2014   Treated by [facility] 07-2014.  [name] HIV (human immunodeficiency virus infection) (HCC)     Past Surgical History:  Procedure Laterality Date  . hand surgery Right   . LUMBAR PUNCTURE  08/08/2019        Family History  Adopted: Yes  Family history unknown: Yes    Social History:  reports that he has been smoking cigarettes. He has never used smokeless tobacco. He reports current alcohol use. He reports current drug use. Frequency: 14.00 times per week. Drugs: Marijuana and Methamphetamines.  Allergies: No Known Allergies  Medications: I have reviewed the patient's current medications.  No results found for this or any previous visit (from the past 48 hour(s)).  DG Elbow Complete Right  Result Date: 12/12/2019 CLINICAL DATA:  Posttraumatic elbow pain EXAM: RIGHT ELBOW - COMPLETE 3+ VIEW COMPARISON:  None. FINDINGS: There is no evidence of fracture, dislocation, or joint effusion. Corticated ossicles at the olecranon process. Probable medial-sided soft tissue swelling. IMPRESSION: Negative for fracture or subluxation. Electronically Signed   By: Marnee Spring M.D.   On: 12/12/2019 05:39   DG Forearm Right  Result Date: 12/12/2019 CLINICAL DATA:  Posttraumatic right upper extremity pain. EXAM: RIGHT FOREARM - 2 VIEW COMPARISON:  None. FINDINGS: There is no evidence of fracture or other focal bone lesions. Soft tissues are unremarkable. IMPRESSION: Negative. Electronically Signed   By: Marnee Spring M.D.   On: 12/12/2019 05:31   DG  Wrist Complete Right  Result Date: 12/12/2019 CLINICAL DATA:  Posttraumatic wrist injury EXAM: RIGHT WRIST - COMPLETE 3+ VIEW COMPARISON:  None. FINDINGS: Lateral-sided intercarpal coalition or ankylosis with adjacent intercarpal degenerative spurring and sclerosis. No acute fracture or dislocation. There is a healing fourth metacarpal neck fracture. IMPRESSION: 1. No acute finding. 2. 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 3. Healing fourth metacarpal neck fracture. Electronically Signed   By: Marnee Spring M.D.   On: 12/12/2019 05:31   DG Hand Complete Right  Result Date: 12/12/2019 CLINICAL DATA:  Posttraumatic hand pain. EXAM: RIGHT HAND - COMPLETE 3+ VIEW COMPARISON:  None. FINDINGS: Skin injury on the palmar surface of the middle finger. No underlying dislocation or fracture. No opaque foreign body. Fourth metacarpal neck fracture which is nonacute based on periosteal reaction Coalition appearance versus ankylosis of the lateral sided intercarpal joint with medial sided intercarpal spurring. IMPRESSION: 1. Soft tissue injury to the middle finger without acute fracture. 2. Subacute, healing fourth metacarpal neck fracture without displacement. 3. 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 Electronically Signed   By: Marnee Spring M.D.   On: 12/12/2019 05:30    Review of Systems  HENT: Negative for ear discharge, ear pain, hearing loss and tinnitus.   Eyes: Negative for photophobia and pain.  Respiratory: Negative for cough and shortness of breath.   Cardiovascular: Negative for chest pain.  Gastrointestinal: Negative for abdominal pain, nausea and vomiting.  Genitourinary: Negative for dysuria, flank pain, frequency  and urgency.  Musculoskeletal: Positive for myalgias (Right hand/elbow). Negative for back pain and neck pain.  Neurological: Negative for dizziness and headaches.   Hematological: Does not bruise/bleed easily.  Psychiatric/Behavioral: The patient is not nervous/anxious.    Blood pressure 112/76, pulse 100, temperature 98.6 F (37 C), temperature source Oral, resp. rate (!) 22, height 5\' 11"  (1.803 m), weight 79.4 kg, SpO2 96 %. Physical Exam  Constitutional: He appears well-developed. No distress.  HENT:  Head: Normocephalic and atraumatic.  Eyes: Conjunctivae are normal. Right eye exhibits no discharge. Left eye exhibits no discharge. No scleral icterus.  Cardiovascular: Normal rate and regular rhythm.  Respiratory: Effort normal. No respiratory distress.  Musculoskeletal:     Cervical back: Normal range of motion.     Comments: Right shoulder, elbow, wrist, digits- Closed lacerations to palmar long and ring fingers, unable to flex at long DIP joint, no instability, no blocks to motion  Sens  Ax/R/M/U intact  Mot   Ax/ R/ PIN/ M/ AIN/ U intact  Rad 2+   Neurological: He is alert.  Skin: Skin is warm and dry. He is not diaphoretic.  Psychiatric: His behavior is normal.    Assessment/Plan: Right long finger flexor tendon injury -- Dr. Grandville Silos to see today in office to discuss surgery.     [name], PA-C Orthopedic Surgery [phone] 12/12/2019, 8:50 AM
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
## 2019-12-12 NOTE — ED Provider Notes (Signed)
Ortho PA Earney Hamburg evaluated pt and requests pt discharge. Pt to go directly to Dr. Carollee Massed office for evaluation.   Cambrie Sonnenfeld, [place] N, PA-C 12/12/19 0910    Dione Booze, MD 12/12/19 [phone]
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
## 2020-01-01 ENCOUNTER — Emergency Department (HOSPITAL_COMMUNITY)
Admission: EM | Admit: 2020-01-01 | Discharge: 2020-01-02 | Disposition: A | Discharge status: Other Acute Inpatient Hospital | Payer: Self-pay | Attending: Emergency Medicine | Admitting: Emergency Medicine

## 2020-01-01 ENCOUNTER — Other Ambulatory Visit: Payer: Self-pay

## 2020-01-01 ENCOUNTER — Encounter (HOSPITAL_COMMUNITY): Payer: Self-pay | Admitting: *Deleted

## 2020-01-01 DIAGNOSIS — Z20822 Contact with and (suspected) exposure to covid-19: Secondary | ICD-10-CM | POA: Insufficient documentation

## 2020-01-01 DIAGNOSIS — R45851 Suicidal ideations: Secondary | ICD-10-CM | POA: Insufficient documentation

## 2020-01-01 DIAGNOSIS — F17210 Nicotine dependence, cigarettes, uncomplicated: Secondary | ICD-10-CM | POA: Insufficient documentation

## 2020-01-01 DIAGNOSIS — G40909 Epilepsy, unspecified, not intractable, without status epilepticus: Secondary | ICD-10-CM | POA: Insufficient documentation

## 2020-01-01 HISTORY — DX: Unspecified convulsions: R56.9

## 2020-01-01 LAB — CBG MONITORING, ED: Glucose-Capillary: 116 mg/dL — ABNORMAL HIGH (ref 70–99)
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
## 2020-01-02 ENCOUNTER — Ambulatory Visit (HOSPITAL_COMMUNITY)
Admission: EM | Admit: 2020-01-02 | Discharge: 2020-01-03 | Disposition: A | Discharge status: Home / Self Care | Payer: No Payment, Other | Attending: Psychiatry | Admitting: Psychiatry

## 2020-01-02 DIAGNOSIS — B20 Human immunodeficiency virus [HIV] disease: Secondary | ICD-10-CM

## 2020-01-02 DIAGNOSIS — F17210 Nicotine dependence, cigarettes, uncomplicated: Secondary | ICD-10-CM | POA: Insufficient documentation

## 2020-01-02 DIAGNOSIS — R45851 Suicidal ideations: Secondary | ICD-10-CM | POA: Insufficient documentation

## 2020-01-02 DIAGNOSIS — F419 Anxiety disorder, unspecified: Secondary | ICD-10-CM | POA: Insufficient documentation

## 2020-01-02 DIAGNOSIS — Z79899 Other long term (current) drug therapy: Secondary | ICD-10-CM | POA: Insufficient documentation

## 2020-01-02 DIAGNOSIS — Z915 Personal history of self-harm: Secondary | ICD-10-CM | POA: Insufficient documentation

## 2020-01-02 DIAGNOSIS — F332 Major depressive disorder, recurrent severe without psychotic features: Secondary | ICD-10-CM

## 2020-01-02 DIAGNOSIS — R569 Unspecified convulsions: Secondary | ICD-10-CM | POA: Insufficient documentation

## 2020-01-02 LAB — BASIC METABOLIC PANEL
Anion gap: 13 (ref 5–15)
BUN: 9 mg/dL (ref 6–20)
CO2: 26 mmol/L (ref 22–32)
Calcium: 9.1 mg/dL (ref 8.9–10.3)
Chloride: 102 mmol/L (ref 98–111)
Creatinine, Ser: 1.17 mg/dL (ref 0.61–1.24)
GFR calc Af Amer: >60 mL/min (ref >60)
GFR calc non Af Amer: >60 mL/min (ref >60)
Glucose, Bld: 98 mg/dL (ref 70–99)
Potassium: 3.8 mmol/L (ref 3.5–5.1)
Sodium: 141 mmol/L (ref 135–145)

## 2020-01-02 LAB — POCT URINE DRUG SCREEN - MANUAL ENTRY (I-SCREEN)
POC Amphetamine UR: NOT DETECTED
POC Buprenorphine (BUP): NOT DETECTED
POC Cocaine UR: NOT DETECTED
POC Marijuana UR: NOT DETECTED
POC Methadone UR: NOT DETECTED
POC Methamphetamine UR: NOT DETECTED
POC Morphine: NOT DETECTED
POC Oxazepam (BZO): NOT DETECTED
POC Oxycodone UR: NOT DETECTED
POC Secobarbital (BAR): NOT DETECTED

## 2020-01-02 LAB — CBC WITH DIFFERENTIAL/PLATELET
Abs Immature Granulocytes: 0.01 10*3/uL (ref 0.00–0.07)
Basophils Absolute: 0 10*3/uL (ref 0.0–0.1)
Basophils Relative: 0 %
Eosinophils Absolute: 0 10*3/uL (ref 0.0–0.5)
Eosinophils Relative: 0 %
HCT: 46.1 % (ref 39.0–52.0)
Hemoglobin: 15.6 g/dL (ref 13.0–17.0)
Immature Granulocytes: 0 %
Lymphocytes Relative: 41 %
Lymphs Abs: 1.5 10*3/uL (ref 0.7–4.0)
MCH: 32.8 pg (ref 26.0–34.0)
MCHC: 33.8 g/dL (ref 30.0–36.0)
MCV: 96.8 fL (ref 80.0–100.0)
Monocytes Absolute: 0.5 10*3/uL (ref 0.1–1.0)
Monocytes Relative: 13 %
Neutro Abs: 1.7 10*3/uL (ref 1.7–7.7)
Neutrophils Relative %: 46 %
Platelets: 112 10*3/uL — ABNORMAL LOW (ref 150–400)
RBC: 4.76 MIL/uL (ref 4.22–5.81)
RDW: 11.8 % (ref 11.5–15.5)
WBC: 3.7 10*3/uL — ABNORMAL LOW (ref 4.0–10.5)
nRBC: 0 % (ref 0.0–0.2)

## 2020-01-02 LAB — TSH: TSH: 0.942 u[IU]/mL (ref 0.350–4.500)

## 2020-01-02 LAB — CBG MONITORING, ED: Glucose-Capillary: 88 mg/dL (ref 70–99)

## 2020-01-02 LAB — SARS CORONAVIRUS 2 BY RT PCR (HOSPITAL ORDER, PERFORMED IN ~~LOC~~ HOSPITAL LAB): SARS Coronavirus 2: NEGATIVE

## 2020-01-02 MED ORDER — LORAZEPAM 1 MG PO TABS
1.0000 mg | ORAL_TABLET | ORAL | Status: AC | PRN
  Administered 2020-01-02: 1 mg via ORAL
  Filled 2020-01-02: qty 1

## 2020-01-02 MED ORDER — ARIPIPRAZOLE 5 MG PO TABS
5.0000 mg | ORAL_TABLET | Freq: Every day | ORAL | Status: DC
  Administered 2020-01-03: 5 mg via ORAL
  Filled 2020-01-02: qty 1

## 2020-01-02 MED ORDER — BICTEGRAVIR-EMTRICITAB-TENOFOV 50-200-25 MG PO TABS
1.0000 | ORAL_TABLET | Freq: Every day | ORAL | Status: DC
  Administered 2020-01-02: 1 via ORAL
  Filled 2020-01-02: qty 1

## 2020-01-02 MED ORDER — LEVETIRACETAM 500 MG PO TABS
1000.0000 mg | ORAL_TABLET | Freq: Once | ORAL | Status: AC
  Administered 2020-01-02: 1000 mg via ORAL
  Filled 2020-01-02: qty 2

## 2020-01-02 MED ORDER — SERTRALINE HCL 50 MG PO TABS: 50.0000 mg | ORAL_TABLET | Freq: Every day | ORAL | Status: DC

## 2020-01-02 MED ORDER — ALUM & MAG HYDROXIDE-SIMETH 200-200-20 MG/5ML PO SUSP: 30.0000 mL | ORAL | Status: DC | PRN

## 2020-01-02 MED ORDER — LORAZEPAM 1 MG PO TABS
1.0000 mg | ORAL_TABLET | Freq: Once | ORAL | Status: AC
  Administered 2020-01-02: 1 mg via ORAL
  Filled 2020-01-02: qty 1

## 2020-01-02 MED ORDER — TRAZODONE HCL 50 MG PO TABS
50.0000 mg | ORAL_TABLET | Freq: Once | ORAL | Status: AC
  Administered 2020-01-02: 50 mg via ORAL

## 2020-01-02 MED ORDER — TRAZODONE HCL 50 MG PO TABS
50.0000 mg | ORAL_TABLET | Freq: Every evening | ORAL | Status: DC | PRN
  Administered 2020-01-02: 50 mg via ORAL
  Filled 2020-01-02 (×2): qty 1

## 2020-01-02 MED ORDER — ARIPIPRAZOLE 5 MG PO TABS: 5.0000 mg | ORAL_TABLET | Freq: Every day | ORAL | Status: DC

## 2020-01-02 MED ORDER — SERTRALINE HCL 50 MG PO TABS
50.0000 mg | ORAL_TABLET | Freq: Every day | ORAL | Status: DC
  Administered 2020-01-03: 50 mg via ORAL
  Filled 2020-01-02: qty 1

## 2020-01-02 MED ORDER — HYDROXYZINE HCL 25 MG PO TABS
25.0000 mg | ORAL_TABLET | Freq: Four times a day (QID) | ORAL | Status: DC | PRN
  Administered 2020-01-02: 25 mg via ORAL
  Filled 2020-01-02: qty 1

## 2020-01-02 MED ORDER — LEVETIRACETAM 500 MG PO TABS
500.0000 mg | ORAL_TABLET | Freq: Two times a day (BID) | ORAL | Status: DC
  Administered 2020-01-02: 500 mg via ORAL
  Filled 2020-01-02: qty 1

## 2020-01-02 MED ORDER — ACETAMINOPHEN 325 MG PO TABS
650.0000 mg | ORAL_TABLET | Freq: Four times a day (QID) | ORAL | Status: DC | PRN
  Administered 2020-01-02 – 2020-01-03 (×2): 650 mg via ORAL
  Filled 2020-01-02 (×2): qty 2

## 2020-01-02 MED ORDER — OLANZAPINE 5 MG PO TBDP: 5.0000 mg | ORAL_TABLET | Freq: Three times a day (TID) | ORAL | Status: DC | PRN

## 2020-01-02 MED ORDER — ACETAMINOPHEN 325 MG PO TABS: 650.0000 mg | ORAL_TABLET | ORAL | Status: DC | PRN

## 2020-01-02 MED ORDER — ZIPRASIDONE MESYLATE 20 MG IM SOLR: 20.0000 mg | INTRAMUSCULAR | Status: DC | PRN

## 2020-01-02 MED ORDER — SERTRALINE HCL 50 MG PO TABS
50.0000 mg | ORAL_TABLET | Freq: Every day | ORAL | Status: DC
  Administered 2020-01-02: 50 mg via ORAL
  Filled 2020-01-02: qty 1

## 2020-01-02 MED ORDER — MAGNESIUM HYDROXIDE 400 MG/5ML PO SUSP: 30.0000 mL | Freq: Every day | ORAL | Status: DC | PRN

## 2020-01-02 MED ORDER — ZOLPIDEM TARTRATE 5 MG PO TABS: 5.0000 mg | ORAL_TABLET | Freq: Every evening | ORAL | Status: DC | PRN

## 2020-01-02 MED ORDER — SOFOSBUVIR-VELPATASVIR 400-100 MG PO TABS: 1.0000 | ORAL_TABLET | Freq: Every day | ORAL | Status: DC

## 2020-01-02 NOTE — ED Notes (Signed)
Pt A&O x 4, resting at present, calm & cooperative, pleasant interactive with staff.  Meal given.  No distress noted, monitoring for safety.

## 2020-01-02 NOTE — ED Notes (Signed)
Pt provided with warm blankets x 2.

## 2020-01-02 NOTE — ED Notes (Signed)
TTS Machine placed at bedside.
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
## 2020-01-02 NOTE — ED Notes (Signed)
Patient alert, oriented and ambulatory. Patient voices SI with plan to overdose on sleep medication. Patient gives verbal contract for safety while on unit. Patient denies HI A/V/H. Patient oriented to unit and staff. Given food and fluids. Patient provided support and encouragement. Monitoring continues and patient remains safe on unit.

## 2020-01-02 NOTE — [facility] Counselor (Signed)
Clinician called TTS cart however there was no answer. Clinician to check back.   Redmond Pulling, MS, [facility], [facility] Triage Specialist [phone].

## 2020-01-02 NOTE — ED Provider Notes (Signed)
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 2.5  1.9 - 3.7 g/dL  (calc) Final   AG Ratio 10/29/2019 1.6  1.0 - 2.5 (calc) Final   Total Bilirubin 10/29/2019 0.6  0.2 - 1.2 mg/dL Final   Alkaline phosphatase (APISO) 10/29/2019 94  36 - 130 U/L Final   AST 10/29/2019 25  10 - 40 U/L Final   ALT 10/29/2019 22  9 - 46 U/L Final   WBC 10/29/2019 7.0  3.8 - 10.8 Thousand/uL Final   RBC 10/29/2019 4.39  4.20 - 5.80 Million/uL Final   Hemoglobin 10/29/2019 14.2  13.2 - 17.1 g/dL Final   HCT 21/30/8657 41.2  38 - 50 % Final   MCV 10/29/2019 93.8  80.0 - 100.0 fL Final   MCH 10/29/2019 32.3  27.0 - 33.0 pg Final   MCHC 10/29/2019 34.5  32.0 - 36.0 g/dL Final   RDW 84/69/6295 11.3  11.0 - 15.0 % Final   Platelets 10/29/2019 255  140 - 400 Thousand/uL Final   MPV 10/29/2019 10.1  7.5 - 12.5 fL Final   Neutro Abs 10/29/2019 3,402  1,500 - 7,800 cells/uL Final   Lymphs Abs 10/29/2019 2,695  850 - 3,900 cells/uL Final   Absolute Monocytes 10/29/2019 763  200 - 950 cells/uL Final   Eosinophils Absolute 10/29/2019 112  15 - 500 cells/uL Final   Basophils Absolute 10/29/2019 28  0 - 200 cells/uL Final   Neutrophils Relative % 10/29/2019 48.6  % Final   Total Lymphocyte 10/29/2019 38.5  % Final   Monocytes Relative 10/29/2019 10.9  % Final   Eosinophils Relative 10/29/2019 1.6  % Final   Basophils Relative 10/29/2019 0.4  % Final  Admission on 08/21/2019, Discharged on 08/21/2019  Component Date Value Ref Range Status   Sodium 08/21/2019 141  135 - 145 mmol/L Final   Potassium 08/21/2019 3.8  3.5 - 5.1 mmol/L Final   Chloride 08/21/2019 102  98 - 111 mmol/L Final   CO2 08/21/2019 31  22 - 32 mmol/L Final   Glucose, Bld 08/21/2019 105* 70 - 99 mg/dL Final   BUN 28/41/3244 7  6 - 20 mg/dL Final   Creatinine, Ser 08/21/2019 1.16  0.61 - 1.24 mg/dL Final   Calcium 07/06/7251 9.3  8.9 - 10.3 mg/dL Final   Total Protein 66/44/0347 8.3* 6.5 - 8.1 g/dL Final   Albumin 42/59/5638 4.4  3.5 - 5.0 g/dL Final   AST 75/64/3329 38  15 - 41  U/L Final   ALT 08/21/2019 59* 0 - 44 U/L Final   Alkaline Phosphatase 08/21/2019 89  38 - 126 U/L Final   Total Bilirubin 08/21/2019 1.4* 0.3 - 1.2 mg/dL Final   GFR calc non Af Amer 08/21/2019 >60  >60 mL/min Final   GFR calc Af Amer 08/21/2019 >60  >60 mL/min Final   Anion gap 08/21/2019 8  5 - 15 Final   Performed at [facility], [address], [place], [place] [address]   Alcohol, Ethyl (B) 08/21/2019 <10  <10 mg/dL Final   Comment: (NOTE) Lowest detectable limit for serum alcohol is 10 mg/dL. For medical purposes only. Performed at [facility], [address], [place], [place] [address]  WBC 08/21/2019 7.2  4.0 - 10.5 K/uL Final   RBC 08/21/2019 5.22  4.22 - 5.81 MIL/uL Final   Hemoglobin 08/21/2019 16.8  13.0 - 17.0 g/dL Final   HCT 40/98/1191 52.4* 39 - 52 % Final   MCV 08/21/2019 100.4* 80.0 - 100.0 fL Final   MCH 08/21/2019 32.2  26.0 - 34.0 pg Final   MCHC 08/21/2019 32.1  30.0 - 36.0 g/dL Final   RDW 47/82/9562 11.8  11.5 - 15.5 % Final   Platelets 08/21/2019 251  150 - 400 K/uL Final   nRBC 08/21/2019 0.0  0.0 - 0.2 % Final   Neutrophils Relative % 08/21/2019 85  % Final   Neutro Abs 08/21/2019 6.1  1.7 - 7.7 K/uL Final   Lymphocytes Relative 08/21/2019 10  % Final   Lymphs Abs 08/21/2019 0.7  0.7 - 4.0 K/uL Final   Monocytes Relative 08/21/2019 5  % Final   Monocytes Absolute 08/21/2019 0.4  0 - 1 K/uL Final   Eosinophils Relative 08/21/2019 0  % Final   Eosinophils Absolute 08/21/2019 0.0  0 - 0 K/uL Final   Basophils Relative 08/21/2019 0  % Final   Basophils Absolute 08/21/2019 0.0  0 - 0 K/uL Final   Immature Granulocytes 08/21/2019 0  % Final   Abs Immature Granulocytes 08/21/2019 0.02  0.00 - 0.07 K/uL Final   Performed at [facility], [address], [place], [place] [address]   Magnesium 08/21/2019 2.2  1.7 - 2.4 mg/dL Final   Performed at [facility], [address] [place], [place] [address]  Admission on 08/11/2019, Discharged on 08/11/2019  Component Date Value Ref Range Status   WBC 08/11/2019 7.2  4.0 - 10.5 K/uL Final   RBC 08/11/2019 5.20  4.22 - 5.81 MIL/uL Final   Hemoglobin 08/11/2019 17.3* 13.0 - 17.0 g/dL Final   HCT 96/29/5284 53.6* 39 - 52 % Final   MCV 08/11/2019 103.1* 80.0 - 100.0 fL Final   MCH 08/11/2019 33.3  26.0 - 34.0 pg Final   MCHC 08/11/2019 32.3  30.0 - 36.0 g/dL Final   RDW 13/24/4010 12.5  11.5 - 15.5 % Final   Platelets 08/11/2019 217  150 - 400 K/uL Final   nRBC 08/11/2019 0.0  0.0 - 0.2 % Final   Neutrophils Relative % 08/11/2019 73  % Final   Neutro Abs 08/11/2019 5.3  1.7 - 7.7 K/uL Final   Lymphocytes Relative 08/11/2019 18  % Final   Lymphs Abs 08/11/2019 1.3  0.7 - 4.0 K/uL Final   Monocytes Relative 08/11/2019 8  % Final   Monocytes Absolute 08/11/2019 0.5  0 - 1 K/uL Final   Eosinophils Relative 08/11/2019 0  % Final   Eosinophils Absolute 08/11/2019 0.0  0 - 0 K/uL Final   Basophils Relative 08/11/2019 0  % Final   Basophils Absolute 08/11/2019 0.0  0 - 0 K/uL Final   Immature Granulocytes 08/11/2019 1  % Final   Abs Immature Granulocytes 08/11/2019 0.04  0.00 - 0.07 K/uL Final   Performed at [facility], [address] [place], [place] [address]   Alcohol, Ethyl (B) 08/11/2019 <10  <10 mg/dL Final   Comment: (NOTE) Lowest detectable limit for serum alcohol is 10 mg/dL. For medical purposes only. Performed at [facility], [address] [place], [place] [address]    Sodium 08/11/2019 138  135 - 145 mmol/L Final   Potassium 08/11/2019 3.6  3.5 -  5.1 mmol/L Final   Chloride 08/11/2019 100  98 - 111 mmol/L Final   CO2 08/11/2019 27  22 - 32 mmol/L Final   Glucose, Bld 08/11/2019 94  70 - 99 mg/dL Final   BUN 36/64/4034 16  6 - 20 mg/dL Final   Creatinine, Ser 08/11/2019 1.18  0.61 - 1.24 mg/dL Final    Calcium 74/25/9563 9.7  8.9 - 10.3 mg/dL Final   Total Protein 87/56/4332 8.3* 6.5 - 8.1 g/dL Final   Albumin 95/18/8416 4.4  3.5 - 5.0 g/dL Final   AST 60/63/0160 82* 15 - 41 U/L Final   ALT 08/11/2019 106* 0 - 44 U/L Final   Alkaline Phosphatase 08/11/2019 77  38 - 126 U/L Final   Total Bilirubin 08/11/2019 1.3* 0.3 - 1.2 mg/dL Final   GFR calc non Af Amer 08/11/2019 >60  >60 mL/min Final   GFR calc Af Amer 08/11/2019 >60  >60 mL/min Final   Anion gap 08/11/2019 11  5 - 15 Final   Performed at [facility], [address], [place], [place] [address]   Ammonia 08/11/2019 29  9 - 35 umol/L Final   Performed at [facility], [address], [place], [place] [address]  No results displayed because visit has over 200 results.      Allergies: Patient has no known allergies.  PTA Medications: (Not in a hospital admission)   Medical Decision Making  Admit patient to the [facility] for continuous observation. Continue ED medications: Abilify 5 mg PO daily and Zoloft 50 mg PO daily.      Recommendations  Based on my evaluation the patient does not appear to have an emergency medical condition.  Maryfrances Bunnell, FNP 01/02/20  4:44 PM

## 2020-01-02 NOTE — ED Provider Notes (Signed)
[facility]-EMERGENCY DEPT Provider Note   CSN: [id number] Arrival date & time: 01/01/20  2000     History Chief Complaint  Patient presents with  . Seizures    Justin [name] is a 39 y.o. male.  HPI     39 year old male comes in a chief complaint of seizures and suicidal ideations.  Patient has history of HIV, seizure disorder, remote history of endocarditis and drug use.  He reports that he was imprisoned, and while in prison he had a seizure-like activity.  He was not getting his HIV meds or seizure meds while he was in the jail.  Patient also states that he was supposed to get tendon repair done by hand surgeon, but because of his imprisonment he missed that appointment.  Finally patient is also having suicidal ideations.  Past Medical History:  Diagnosis Date  . Endocarditis   . Hepatitis C   . History of syphilis 08/25/2014   Treated by [facility] 07-2014.  [name] HIV (human immunodeficiency virus infection) (HCC)   . Seizures [facility])     Patient Active Problem List   Diagnosis Date Noted  . Substance use disorder 08/07/2019  . HIV disease (HCC) 08/07/2019  . Acute kidney injury (HCC) 08/07/2019  . Acute encephalopathy 08/06/2019  . Chronic hepatitis C without hepatic coma (HCC) 08/17/2017  . Screening examination for venereal disease 05/03/2017  . Encounter for long-term (current) use of high-risk medication 05/03/2017  . Anxiety 09/26/2016  . Substance abuse (HCC) 09/26/2016  . Human immunodeficiency virus (HIV) disease (HCC) 09/02/2014  . History of syphilis 08/25/2014    Past Surgical History:  Procedure Laterality Date  . hand surgery Right   . LUMBAR PUNCTURE  08/08/2019           Family History  Adopted: Yes  Family history unknown: Yes    Social History   Tobacco Use  . Smoking status: Current Every Day Smoker    Types: Cigarettes  . Smokeless tobacco: Never Used  Vaping Use  . Vaping Use: Never used  Substance Use Topics  .  Alcohol use: Yes    Comment: 1-2 a week   . Drug use: Yes    Frequency: 14.0 times per week    Types: Marijuana    Home Medications Prior to Admission medications   Medication Sig Start Date End Date Taking? Authorizing Provider  ARIPiprazole (ABILIFY) 5 MG tablet Take 1 tablet (5 mg total) by mouth daily. 12/06/19  Yes [name], MD  bictegravir-emtricitabine-tenofovir AF (BIKTARVY) 50-200-25 MG TABS tablet Take 1 tablet by mouth daily. 11/18/19  Yes Comer, Belia Heman, MD  levETIRAcetam (KEPPRA) 500 MG tablet Take 1 tablet (500 mg total) by mouth 2 (two) times daily. 08/09/19  Yes Tyson Alias, MD  sertraline (ZOLOFT) 50 MG tablet Take 1 tablet (50 mg total) by mouth daily. 12/06/19  Yes [name], MD  Sofosbuvir-Velpatasvir (EPCLUSA) 400-100 MG TABS Take 1 tablet by mouth daily. Patient not taking: Reported on 11/29/2019 03/08/19   [name], MD    Allergies    Patient has no known allergies.  Review of Systems   Review of Systems  Constitutional: Positive for activity change.  Respiratory: Negative for shortness of breath.   Cardiovascular: Negative for chest pain.  Allergic/Immunologic: Negative for immunocompromised state.  Hematological: Does not bruise/bleed easily.  Psychiatric/Behavioral: Positive for suicidal ideas.  All other systems reviewed and are negative.   Physical Exam Updated Vital Signs BP 113/83  Pulse (!) 107   Temp 98.6 F (37 C)   Resp 17   Ht 5\' 11"  (1.803 m)   Wt 79.4 kg   SpO2 95%   BMI 24.41 kg/m   Physical Exam Vitals and nursing note reviewed.  Constitutional:      Appearance: He is well-developed.  HENT:     Head: Atraumatic.  Cardiovascular:     Rate and Rhythm: Normal rate.  Pulmonary:     Effort: Pulmonary effort is normal.  Musculoskeletal:     Cervical back: Neck supple.     Comments: Patient has repaired wound to the digits, with sutures intact and no signs of cellulitis, purulence.  Skin:    General:  Skin is warm.  Neurological:     Mental Status: He is alert and oriented to person, place, and time.     ED Results / Procedures / Treatments   Labs (all labs ordered are listed, but only abnormal results are displayed) Labs Reviewed  CBG MONITORING, ED - Abnormal; Notable for the following components:      Result Value   Glucose-Capillary 116 (*)    All other components within normal limits  SARS CORONAVIRUS 2 BY RT PCR (HOSPITAL ORDER, PERFORMED IN [facility])  BASIC METABOLIC PANEL  CBC WITH DIFFERENTIAL/PLATELET  CBG MONITORING, ED    EKG None  Radiology No results found.  Procedures Procedures (including critical care time)  Medications Ordered in ED Medications  OLANZapine zydis (ZYPREXA) disintegrating tablet 5 mg (has no administration in time range)    And  LORazepam (ATIVAN) tablet 1 mg (1 mg Oral Given 01/02/20 0033)    And  ziprasidone (GEODON) injection 20 mg (has no administration in time range)  acetaminophen (TYLENOL) tablet 650 mg (has no administration in time range)  zolpidem (AMBIEN) tablet 5 mg (has no administration in time range)  Sofosbuvir-Velpatasvir 400-100 MG TABS 1 tablet (has no administration in time range)  bictegravir-emtricitabine-tenofovir AF (BIKTARVY) 50-200-25 MG per tablet 1 tablet (has no administration in time range)  levETIRAcetam (KEPPRA) tablet 500 mg (has no administration in time range)  sertraline (ZOLOFT) tablet 50 mg (has no administration in time range)  levETIRAcetam (KEPPRA) tablet 1,000 mg (1,000 mg Oral Given 01/02/20 0033)    ED Course  I have reviewed the triage vital signs and the nursing notes.  Pertinent labs & imaging results that were available during my care of the patient were reviewed by me and considered in my medical decision making (see chart for details).    MDM Rules/Calculators/A&P                          39 year old comes in with multiple complaints.  It appears that he has  seizure disorder and has not received seizure medications for the last 18 days.  We have given him oral Keppra load.  Labs in the ER appear to be reassuring.  He is medically cleared.  In addition patient complains of missing appointment with orthopedic surgeon.  I have sent a message to Dr. 24 about the reason for the missed appointment, and have advised patient to call Dr. Janee Morn again.  Finally patient is having SI.  He is medically cleared for psych eval and I have discussed the case with Janee Morn, NP at [facility], and she will accept the patient.  Final Clinical Impression(s) / ED Diagnoses Final diagnoses:  Suicidal ideations  Seizure disorder (HCC)    Rx /  DC Orders ED Discharge Orders    None       Derwood Kaplan, MD 01/02/20 [phone]

## 2020-01-02 NOTE — BH Assessment (Signed)
[facility] Assessment Progress Note  Per Berneice Heinrich, FNP, pt is to be is transferred to the [facility].  Reola Calkins, NP agrees to accept pt.  Please call report to [phone].  Pt is to be transported via [employer].  Pt's nurse has been notified.  Doylene Canning, [place] Behavioral Health Coordinator [phone]

## 2020-01-02 NOTE — Patient Outreach (Signed)
ED Peer Support Specialist Patient Intake (Complete at intake & 30-60 Day Follow-up)  Name: Justin [name]  MRN: [id number]  Age: 39 y.o.   Date of Admission: 01/02/2020  Intake: Initial Comments:      Primary Reason Admitted: Suicidal ideations Seizure disorder    Lab values: Alcohol/ETOH: Negative Positive UDS? No Amphetamines: No Barbiturates: No Benzodiazepines: No Cocaine: No Opiates: No Cannabinoids: No  Demographic information: Gender: Male Ethnicity: African American Marital Status: Single Insurance Status: Uninsured/Self-pay Ecologist (Work Neurosurgeon, Physicist, medical, etc.: No Lives with: Alone Living situation: Homeless  Reported Patient History: Patient reported health conditions: Anxiety disorders Patient aware of HIV and hepatitis status: No  In past year, has patient visited ED for any reason? Yes  Number of ED visits: 3  Reason(s) for visit: Various reasons  In past year, has patient been hospitalized for any reason? No  Number of hospitalizations:    Reason(s) for hospitalization:    In past year, has patient been arrested? No  Number of arrests:    Reason(s) for arrest:    In past year, has patient been incarcerated? No  Number of incarcerations:    Reason(s) for incarceration:    In past year, has patient received medication-assisted treatment? No  In past year, patient received the following treatments:    In past year, has patient received any harm reduction services?    Did this include any of the following?    In past year, has patient received care from a mental health provider for diagnosis other than SUD? No  In past year, is this first time patient has overdosed? No  Number of past overdoses:    In past year, is this first time patient has been hospitalized for an overdose? No  Number of hospitalizations for overdose(s):    Is patient currently receiving treatment for a mental health  diagnosis? No  Patient reports experiencing difficulty participating in SUD treatment: No    Most important reason(s) for this difficulty?    Has patient received prior services for treatment? No  In past, patient has received services from following agencies:    Plan of Care:  Suggested follow up at these agencies/treatment centers: Other (comment)  Other information:  [facility] met with Pt an was able to gain information to better assist Pt. [facility] was made aware that Pt has just been released from jail and found himself in a bad situation. Pt stated that he wants to get out of town to start working on bettering the quality of his life. [facility] discussed a few options that Pt may benefit from there services.     Justin [name], [place]  01/02/2020 2:01 PM

## 2020-01-03 NOTE — ED Notes (Signed)
Pt resting comfortably at present, no distress noted.  Monitoring for safety. 

## 2020-01-03 NOTE — ED Notes (Signed)
Ambilitory, calm, cooperative, alert x4, laying on bed awake having vitals taken. No new issues noted

## 2020-01-03 NOTE — ED Notes (Signed)
Pt sleeping, no distress noted, resting comfortably.  Monitoring for safety.

## 2020-01-03 NOTE — ED Notes (Signed)
Nurse Discharge Note:  D:Patient denies SI/HI/AVH at this time. Pt appears calm and cooperative, and no distress noted.  A: All Personal items in locker returned to pt. Pt given AVS and it was reviewed with patient and he verbalized understanding. Patient escorted off unit and transported home via [facility].  R:  Pt States she will comply with outpatient / other services, and take medications as prescribed.

## 2020-01-03 NOTE — ED Notes (Signed)
Pt sleeping at present, no distress noted, monitoring for safety. 

## 2020-01-03 NOTE — ED Notes (Signed)
Patient resting with eyes closed.Respirations even and non labored no distress noted.

## 2020-01-03 NOTE — ED Provider Notes (Signed)
FBC/OBS ASAP Discharge Summary  Date and Time: 01/03/2020 12:11 PM  Name: Justin [name]  MRN:  [id number]   Discharge Diagnoses:  Final diagnoses:  Severe episode of recurrent major depressive disorder, without psychotic features (HCC)  Human immunodeficiency virus (HIV) disease (HCC)    Subjective: Patient first reported that he was continued to be suicidal and that his biggest concern was getting out of [place] due to concerns of been an altercation with someone.  Patient had recently gotten out of jail and is now trying to find a place to stay.  Patient kept stating that he wanted to be out of the state.  He reported he did speak with peers support yesterday and they were trying to help him with arrangements for transportation to get out of the state.  [place] came and spoke with the patient and the patient denied having any suicidal ideations and denied any homicidal ideations with a plan for him to discharge to a safe place.  Patient denied having any auditory or visual hallucinations as well.  Stay Summary: Patient was transferred here from the ED for continuous observation overnight.  Patient had reported having suicidal ideations.  Patient was restarted on his Abilify and Zoloft.  Patient was seen on the unit and interacting with peers and staff appropriately and was joking and talking to numerous people as well as laughing.  Patient did not appear to be depressed and did not appear to have any signs or symptoms of agitation or threatening behavior.  Patient also did not appear to be responding to any internal or external stimuli.  Plan was arranged with peer support specialist and the patient was provided with transportation to safe transport.  Patient was discharged with peers for specialist.  Total Time spent with patient: 20 minutes  Past Psychiatric History: suicidal ideations, MDD, substance abuse Past Medical History:  Past Medical History:  Diagnosis Date    Endocarditis    Hepatitis C    History of syphilis 08/25/2014   [facility]reated by [facility]-2014.   HIV (human immunodeficiency virus infection) (HCC)    Seizures (HCC)     Past Surgical History:  Procedure Laterality Date   hand surgery Right    LUMBAR PUNCTURE  08/08/2019       Family History:  Family History  Adopted: Yes  Family history unknown: Yes   Family Psychiatric History: None reported Social History:  Social History   Substance and Sexual Activity  Alcohol Use Yes   Comment: 1-2 a week      Social History   Substance and Sexual Activity  Drug Use Yes   Frequency: 14.0 times per week   Types: Marijuana    Social History   Socioeconomic History   Marital status: Widowed    Spouse name: Not on file   Number of children: Not on file   Years of education: Not on file   Highest education level: Not on file  Occupational History   Not on file  Tobacco Use   Smoking status: Current Every Day Smoker    Types: Cigarettes   Smokeless tobacco: Never Used  Vaping Use   Vaping Use: Never used  Substance and Sexual Activity   Alcohol use: Yes    Comment: 1-2 a week    Drug use: Yes    Frequency: 14.0 times per week    Types: Marijuana   Sexual activity: Yes    Partners: Male    Birth control/protection: Condom  Comment: given condoms  Other Topics Concern   Not on file  Social History Narrative   ** Merged History Encounter **       ** Merged History Encounter **       ** Merged History Encounter **       Social Determinants of Corporate investment banker Strain:    Difficulty of Paying Living Expenses:   Food Insecurity:    Worried About Programme researcher, broadcasting/film/video in the Last Year:    Barista in the Last Year:   Transportation Needs:    Freight forwarder (Medical):    Lack of Transportation (Non-Medical):   Physical Activity:    Days of Exercise per Week:    Minutes of Exercise per Session:   Stress:    Feeling of  Stress :   Social Connections:    Frequency of Communication with Friends and Family:    Frequency of Social Gatherings with Friends and Family:    Attends Religious Services:    Active Member of Clubs or Organizations:    Attends Engineer, structural:    Marital Status:    SDOH:  SDOH Screenings   Alcohol Screen:    Last Alcohol Screening Score (AUDIT):   Depression (PHQ2-9): Low Risk    PHQ-2 Score: 0  Financial Resource Strain:    Difficulty of Paying Living Expenses:   Food Insecurity:    Worried About Programme researcher, broadcasting/film/video in the Last Year:    Barista in the Last Year:   Housing:    Last Housing Risk Score:   Physical Activity:    Days of Exercise per Week:    Minutes of Exercise per Session:   Social Connections:    Frequency of Communication with Friends and Family:    Frequency of Social Gatherings with Friends and Family:    Attends Religious Services:    Active Member of Clubs or Organizations:    Attends Engineer, structural:    Marital Status:   Stress:    Feeling of Stress :   Tobacco Use: High Risk   Smoking Tobacco Use: Current Every Day Smoker   Smokeless Tobacco Use: Never Used  Transportation Needs:    Freight forwarder (Medical):    Lack of Transportation (Non-Medical):     Has this patient used any form of tobacco in the last 30 days? (Cigarettes, Smokeless Tobacco, Cigars, and/or Pipes) A prescription for an FDA-approved tobacco cessation medication was offered at discharge and the patient refused  Current Medications:  Current Facility-Administered Medications  Medication Dose Route Frequency Provider Last Rate Last Admin   acetaminophen (TYLENOL) tablet 650 mg  650 mg Oral Q6H PRN Jennings Corado, Gerlene Burdock, FNP   650 mg at 01/03/20 0946   alum & mag hydroxide-simeth (MAALOX/MYLANTA) 200-200-20 MG/5ML suspension 30 mL  30 mL Oral Q4H PRN Dalores Weger, [name] B, FNP       ARIPiprazole (ABILIFY) tablet 5 mg   5 mg Oral Daily Eiliana Drone B, FNP   5 mg at 01/03/20 1601   hydrOXYzine (ATARAX/VISTARIL) tablet 25 mg  25 mg Oral Q6H PRN Massimiliano Rohleder, Gerlene Burdock, FNP   25 mg at 01/02/20 2132   magnesium hydroxide (MILK OF MAGNESIA) suspension 30 mL  30 mL Oral Daily PRN Sydny Schnitzler, [name] B, FNP       sertraline (ZOLOFT) tablet 50 mg  50 mg Oral Daily Araiyah Cumpton, [name] B, FNP   50 mg at  01/03/20 0906   traZODone (DESYREL) tablet 50 mg  50 mg Oral QHS PRN Alaria Oconnor, Gerlene Burdock, FNP   50 mg at 01/02/20 2132   Current Outpatient Medications  Medication Sig Dispense Refill   ARIPiprazole (ABILIFY) 5 MG tablet Take 1 tablet (5 mg total) by mouth daily. 30 tablet 0   bictegravir-emtricitabine-tenofovir AF (BIKTARVY) 50-200-25 MG TABS tablet Take 1 tablet by mouth daily. 30 tablet 2   levETIRAcetam (KEPPRA) 500 MG tablet Take 1 tablet (500 mg total) by mouth 2 (two) times daily. 60 tablet 0   sertraline (ZOLOFT) 50 MG tablet Take 1 tablet (50 mg total) by mouth daily. 30 tablet 0   Sofosbuvir-Velpatasvir (EPCLUSA) 400-100 MG TABS Take 1 tablet by mouth daily. (Patient not taking: Reported on 11/29/2019) 28 tablet 2    PTA Medications: (Not in a hospital admission)   Musculoskeletal  Strength & Muscle Tone: within normal limits Gait & Station: normal Patient leans: N/A  Psychiatric Specialty Exam  Presentation  General Appearance: Casual;Appropriate for Environment  Eye Contact:Good  Speech:Normal Rate  Speech Volume:Normal  Handedness:Right   Mood and Affect  Mood:Euthymic  Affect:Congruent   Thought Process  Thought Processes:Coherent  Descriptions of Associations:Intact  Orientation:Full (Time, Place and Person)  Thought Content:WDL  Hallucinations:Hallucinations: None  Ideas of Reference:None  Suicidal Thoughts:Suicidal Thoughts: No SI Active Intent and/or Plan: With Intent;With Plan  Homicidal Thoughts:Homicidal Thoughts: No   Sensorium  Memory:Immediate Good;Recent Good;Remote  Good  Judgment:Fair  Insight:Fair   Executive Functions  Concentration:Fair  Attention Span:Fair  Recall:Fair  Fund of Knowledge:Fair  Language:Fair   Psychomotor Activity  Psychomotor Activity:Psychomotor Activity: Normal   Assets  Assets:Communication Skills;Desire for Improvement;Social Support;Transportation   Sleep  Sleep:Sleep: Fair   Physical Exam  Physical Exam Vitals and nursing note reviewed.  Constitutional:      Appearance: He is well-developed.  Cardiovascular:     Rate and Rhythm: Normal rate.  Pulmonary:     Effort: Pulmonary effort is normal.  Musculoskeletal:        General: Normal range of motion.  Skin:    General: Skin is warm.  Neurological:     Mental Status: He is alert and oriented to person, place, and time.    Review of Systems  Constitutional: Negative.   HENT: Negative.   Eyes: Negative.   Respiratory: Negative.   Cardiovascular: Negative.   Gastrointestinal: Negative.   Genitourinary: Negative.   Musculoskeletal: Negative.   Skin: Negative.   Neurological: Negative.   Endo/Heme/Allergies: Negative.    Blood pressure 108/74, pulse 94, temperature 98.5 F (36.9 C), temperature source Oral, resp. rate 16, SpO2 100 %. There is no height or weight on file to calculate BMI.  Demographic Factors:  Male and Low socioeconomic status  Loss Factors: NA  Historical Factors: NA  Risk Reduction Factors:   Positive social support  Continued Clinical Symptoms:  Previous Psychiatric Diagnoses and Treatments  Cognitive Features That Contribute To Risk:  None    Suicide Risk:  Minimal: No identifiable suicidal ideation.  Patients presenting with no risk factors but with morbid ruminations; may be classified as minimal risk based on the severity of the depressive symptoms  Plan Of Care/Follow-up recommendations:  Continue activity as tolerated. Continue diet as recommended by your PCP. Ensure to keep all appointments with  outpatient providers.  Disposition: Discharge with peer support and transportation provided by safe transport  Maryfrances Bunnell, FNP 01/03/2020, 12:11 PM

## 2020-01-03 NOTE — ED Notes (Signed)
Pt belongings in locker #29. 

## 2020-01-04 ENCOUNTER — Encounter (HOSPITAL_COMMUNITY): Payer: Self-pay

## 2020-01-04 ENCOUNTER — Encounter (HOSPITAL_COMMUNITY): Payer: Self-pay | Admitting: Emergency Medicine

## 2020-01-04 ENCOUNTER — Emergency Department (HOSPITAL_COMMUNITY)
Admission: EM | Admit: 2020-01-04 | Discharge: 2020-01-04 | Disposition: A | Discharge status: Home / Self Care | Payer: Self-pay | Attending: Emergency Medicine | Admitting: Emergency Medicine

## 2020-01-04 ENCOUNTER — Emergency Department (HOSPITAL_COMMUNITY)
Admission: EM | Admit: 2020-01-04 | Discharge: 2020-01-05 | Disposition: A | Discharge status: Home / Self Care | Payer: Self-pay | Attending: Emergency Medicine | Admitting: Emergency Medicine

## 2020-01-04 ENCOUNTER — Other Ambulatory Visit: Payer: Self-pay

## 2020-01-04 DIAGNOSIS — R451 Restlessness and agitation: Secondary | ICD-10-CM | POA: Insufficient documentation

## 2020-01-04 DIAGNOSIS — Z21 Asymptomatic human immunodeficiency virus [HIV] infection status: Secondary | ICD-10-CM | POA: Insufficient documentation

## 2020-01-04 DIAGNOSIS — R456 Violent behavior: Secondary | ICD-10-CM | POA: Insufficient documentation

## 2020-01-04 DIAGNOSIS — F1510 Other stimulant abuse, uncomplicated: Secondary | ICD-10-CM | POA: Insufficient documentation

## 2020-01-04 DIAGNOSIS — R45851 Suicidal ideations: Secondary | ICD-10-CM | POA: Insufficient documentation

## 2020-01-04 DIAGNOSIS — Z79899 Other long term (current) drug therapy: Secondary | ICD-10-CM | POA: Insufficient documentation

## 2020-01-04 DIAGNOSIS — Z046 Encounter for general psychiatric examination, requested by authority: Secondary | ICD-10-CM | POA: Insufficient documentation

## 2020-01-04 DIAGNOSIS — F419 Anxiety disorder, unspecified: Secondary | ICD-10-CM | POA: Insufficient documentation

## 2020-01-04 DIAGNOSIS — F331 Major depressive disorder, recurrent, moderate: Secondary | ICD-10-CM | POA: Insufficient documentation

## 2020-01-04 DIAGNOSIS — T43624A Poisoning by amphetamines, undetermined, initial encounter: Secondary | ICD-10-CM | POA: Insufficient documentation

## 2020-01-04 DIAGNOSIS — Z591 Inadequate housing: Secondary | ICD-10-CM | POA: Insufficient documentation

## 2020-01-04 DIAGNOSIS — F22 Delusional disorders: Secondary | ICD-10-CM | POA: Insufficient documentation

## 2020-01-04 DIAGNOSIS — F17210 Nicotine dependence, cigarettes, uncomplicated: Secondary | ICD-10-CM | POA: Insufficient documentation

## 2020-01-04 DIAGNOSIS — N179 Acute kidney failure, unspecified: Secondary | ICD-10-CM | POA: Insufficient documentation

## 2020-01-04 DIAGNOSIS — R443 Hallucinations, unspecified: Secondary | ICD-10-CM | POA: Insufficient documentation

## 2020-01-04 DIAGNOSIS — Z5321 Procedure and treatment not carried out due to patient leaving prior to being seen by health care provider: Secondary | ICD-10-CM | POA: Insufficient documentation

## 2020-01-04 LAB — COMPREHENSIVE METABOLIC PANEL
ALT: 31 U/L (ref 0–44)
AST: 50 U/L — ABNORMAL HIGH (ref 15–41)
Albumin: 5 g/dL (ref 3.5–5.0)
Alkaline Phosphatase: 100 U/L (ref 38–126)
Anion gap: 18 — ABNORMAL HIGH (ref 5–15)
BUN: 10 mg/dL (ref 6–20)
CO2: 21 mmol/L — ABNORMAL LOW (ref 22–32)
Calcium: 10.3 mg/dL (ref 8.9–10.3)
Chloride: 101 mmol/L (ref 98–111)
Creatinine, Ser: 2.12 mg/dL — ABNORMAL HIGH (ref 0.61–1.24)
GFR calc Af Amer: 44 mL/min — ABNORMAL LOW (ref >60)
GFR calc non Af Amer: 38 mL/min — ABNORMAL LOW (ref >60)
Glucose, Bld: 132 mg/dL — ABNORMAL HIGH (ref 70–99)
Potassium: 3.7 mmol/L (ref 3.5–5.1)
Sodium: 140 mmol/L (ref 135–145)
Total Bilirubin: 2.1 mg/dL — ABNORMAL HIGH (ref 0.3–1.2)
Total Protein: 8.9 g/dL — ABNORMAL HIGH (ref 6.5–8.1)

## 2020-01-04 LAB — RAPID URINE DRUG SCREEN, HOSP PERFORMED
Amphetamines: POSITIVE — AB
Barbiturates: NOT DETECTED
Benzodiazepines: NOT DETECTED
Cocaine: NOT DETECTED
Opiates: NOT DETECTED
Tetrahydrocannabinol: NOT DETECTED

## 2020-01-04 LAB — CBC
HCT: 50.6 % (ref 39.0–52.0)
Hemoglobin: 16.7 g/dL (ref 13.0–17.0)
MCH: 31.7 pg (ref 26.0–34.0)
MCHC: 33 g/dL (ref 30.0–36.0)
MCV: 96 fL (ref 80.0–100.0)
Platelets: 176 10*3/uL (ref 150–400)
RBC: 5.27 MIL/uL (ref 4.22–5.81)
RDW: 11.8 % (ref 11.5–15.5)
WBC: 9 10*3/uL (ref 4.0–10.5)
nRBC: 0 % (ref 0.0–0.2)

## 2020-01-04 LAB — ACETAMINOPHEN LEVEL: Acetaminophen (Tylenol), Serum: <10 ug/mL — ABNORMAL LOW (ref 10–30)

## 2020-01-04 LAB — ETHANOL: Alcohol, Ethyl (B): <10 mg/dL (ref <10)

## 2020-01-04 LAB — SALICYLATE LEVEL: Salicylate Lvl: <7 mg/dL — ABNORMAL LOW (ref 7.0–30.0)

## 2020-01-04 MED ORDER — LEVETIRACETAM 500 MG PO TABS
500.0000 mg | ORAL_TABLET | Freq: Two times a day (BID) | ORAL | Status: DC
  Administered 2020-01-04 – 2020-01-05 (×2): 500 mg via ORAL
  Filled 2020-01-04 (×2): qty 1

## 2020-01-04 MED ORDER — OLANZAPINE 5 MG PO TBDP
5.0000 mg | ORAL_TABLET | Freq: Once | ORAL | Status: AC | PRN
  Administered 2020-01-04: 5 mg via ORAL
  Filled 2020-01-04 (×2): qty 1

## 2020-01-04 MED ORDER — ARIPIPRAZOLE 5 MG PO TABS
5.0000 mg | ORAL_TABLET | Freq: Every day | ORAL | Status: DC
  Administered 2020-01-04 – 2020-01-05 (×2): 5 mg via ORAL
  Filled 2020-01-04 (×5): qty 1

## 2020-01-04 MED ORDER — LORAZEPAM 1 MG PO TABS
1.0000 mg | ORAL_TABLET | Freq: Once | ORAL | Status: AC | PRN
  Administered 2020-01-04: 1 mg via ORAL
  Filled 2020-01-04: qty 1

## 2020-01-04 MED ORDER — SERTRALINE HCL 50 MG PO TABS
50.0000 mg | ORAL_TABLET | Freq: Every day | ORAL | Status: DC
  Administered 2020-01-04 – 2020-01-05 (×2): 50 mg via ORAL
  Filled 2020-01-04 (×4): qty 1

## 2020-01-04 MED ORDER — HALOPERIDOL LACTATE 5 MG/ML IJ SOLN
5.0000 mg | Freq: Once | INTRAMUSCULAR | Status: AC
  Administered 2020-01-04: 5 mg via INTRAVENOUS
  Filled 2020-01-04: qty 1

## 2020-01-04 MED ORDER — BICTEGRAVIR-EMTRICITAB-TENOFOV 50-200-25 MG PO TABS
1.0000 | ORAL_TABLET | Freq: Every day | ORAL | Status: DC
  Administered 2020-01-05 (×2): 1 via ORAL
  Filled 2020-01-04 (×2): qty 1

## 2020-01-04 MED ORDER — LORAZEPAM 2 MG/ML IJ SOLN: 1.0000 mg | Freq: Once | INTRAMUSCULAR | Status: DC

## 2020-01-04 MED ORDER — SODIUM CHLORIDE 0.9 % IV BOLUS
2000.0000 mL | Freq: Once | INTRAVENOUS | Status: AC
  Administered 2020-01-04: 2000 mL via INTRAVENOUS

## 2020-01-04 NOTE — ED Notes (Signed)
I charted on wrong pt for vitals

## 2020-01-04 NOTE — ED Notes (Signed)
Pt is now resting in bed (was previously pacing the room, refused to sit even for IV which was inserted with the patient standing). Pt continues to share thoughts of paranoia.

## 2020-01-04 NOTE — ED Notes (Signed)
Approached pt, attempted to assess, pt ignores this RN, sits with legs crossed and stares at RN. Refuses to verbally acknowledge RN or change into scrubs. [employer] at bedside. Pt follows commands to sit in chair when RN enters room but otherwise ignores commands.

## 2020-01-04 NOTE — ED Provider Notes (Signed)
Went to [employer] station, agitated, tazed x 2, paranoid, thinks people are out to get him. Psychiatric history.  AKI today - getting fluids to correct. Will need to recheck BMET (pending). If AKI resolved, can be assessed by TTS. If not will need admission.   AKI cleared with fluid resuscitation. TTS evaluation ordered for psychiatric treatment.    Elpidio Anis, PA-C 01/05/20 0325    Dione Booze, MD 01/07/20 [phone]

## 2020-01-04 NOTE — ED Triage Notes (Signed)
Patient brought in by [employer] from hotel reporting that "people are after me trying to kill me because I gave someone HIV". Denies SI.

## 2020-01-04 NOTE — ED Notes (Signed)
Pt opens up to this RN about feeling that 4 men are "trying to get me. Help me." Encouraged pt that the ED is locked and there are police officers with him. Pt asks RN to keep her voice down. Accepts oral medications but refuses IV and to change clothes. Pt circles room, wears shirt backwards, puts fists into air at RN but quickly stops when prompted by police saying "Don't do that, she might think that you're going to hurt her."

## 2020-01-04 NOTE — ED Provider Notes (Signed)
[facility] EMERGENCY DEPARTMENT Provider Note   CSN: [id number] Arrival date & time: 01/04/20  1826     History No chief complaint on file.   Justin [name] is a 39 y.o. male with a past medical history of HIV, hepatitis C, substance abuse.  He was brought into the emergency department by [facility] and EMS.  He has a past medical history of methamphetamine abuse.  The patient was recently observe her night at [facility] for suicidal ideation.  Patient was discharged yesterday at noon.  He was brought back to the behavioral health urgent care today in the custody of police.  Please report that he was at the substation across the street from be hugged asking please officers to kill him by shooting him in the head or the face.  He is very tangential, he believes that the "black people" are out to get him.  He does admit to using methamphetamine.  When he was at the behavioral health urgent care he became extremely aggressive.  He states that he was trying to hug one of the officers however this was perceived as a threat.  The patient was tased x2.  He states that he was "jumped into the police gang."  He believes that he knew the police officer he tried to hug from his grinder account.  States that he is hearing and seeing things.  Patient states that he has not been taking his Keppra because he ran out of it.  Review of EMR shows that he was given an oral loading dose yesterday at [facility] emergency department.  HPI     Past Medical History:  Diagnosis Date  . Endocarditis   . Hepatitis C   . History of syphilis 08/25/2014   Treated by [facility] 07-2014.  [name] HIV (human immunodeficiency virus infection) (HCC)   . Seizures [facility])     Patient Active Problem List   Diagnosis Date Noted  . Substance use disorder 08/07/2019  . HIV disease (HCC) 08/07/2019  . Acute kidney injury (HCC) 08/07/2019  . Acute encephalopathy 08/06/2019  . Chronic hepatitis C without hepatic coma (HCC) 08/17/2017   . Screening examination for venereal disease 05/03/2017  . Encounter for long-term (current) use of high-risk medication 05/03/2017  . Anxiety 09/26/2016  . Substance abuse (HCC) 09/26/2016  . Human immunodeficiency virus (HIV) disease (HCC) 09/02/2014  . History of syphilis 08/25/2014    Past Surgical History:  Procedure Laterality Date  . hand surgery Right   . LUMBAR PUNCTURE  08/08/2019           Family History  Adopted: Yes  Family history unknown: Yes    Social History   Tobacco Use  . Smoking status: Current Every Day Smoker    Types: Cigarettes  . Smokeless tobacco: Never Used  Vaping Use  . Vaping Use: Never used  Substance Use Topics  . Alcohol use: Yes    Comment: 1-2 a week   . Drug use: Yes    Frequency: 14.0 times per week    Types: Marijuana    Home Medications Prior to Admission medications   Medication Sig Start Date End Date Taking? Authorizing Provider  ARIPiprazole (ABILIFY) 5 MG tablet Take 1 tablet (5 mg total) by mouth daily. 12/06/19   [name], MD  bictegravir-emtricitabine-tenofovir AF (BIKTARVY) 50-200-25 MG TABS tablet Take 1 tablet by mouth daily. 11/18/19   [name], MD  levETIRAcetam (KEPPRA) 500 MG tablet Take 1 tablet (500  mg total) by mouth 2 (two) times daily. 08/09/19   Tyson Alias, MD  sertraline (ZOLOFT) 50 MG tablet Take 1 tablet (50 mg total) by mouth daily. 12/06/19   [name], MD  Sofosbuvir-Velpatasvir (EPCLUSA) 400-100 MG TABS Take 1 tablet by mouth daily. Patient not taking: Reported on 11/29/2019 03/08/19   [name], MD    Allergies    Patient has no known allergies.  Review of Systems   Review of Systems  Ten systems reviewed and are negative for acute change, except as noted in the HPI.   Physical Exam Updated Vital Signs BP (!) 133/102 (BP Location: Right Wrist)   Pulse (!) 140   Resp 15   Physical Exam Vitals and nursing note reviewed.  Constitutional:      General: He  is not in acute distress.    Appearance: He is well-developed. He is not diaphoretic.  HENT:     Head: Normocephalic and atraumatic.  Eyes:     General: No scleral icterus.    Conjunctiva/sclera: Conjunctivae normal.  Cardiovascular:     Rate and Rhythm: Normal rate and regular rhythm.     Heart sounds: Normal heart sounds.  Pulmonary:     Effort: Pulmonary effort is normal. No respiratory distress.     Breath sounds: Normal breath sounds.  Abdominal:     Palpations: Abdomen is soft.     Tenderness: There is no abdominal tenderness.  Musculoskeletal:     Cervical back: Normal range of motion and neck supple.     Comments: Dissolvable sutures in place in the middle finger of the right hand  Skin:    General: Skin is warm and dry.  Neurological:     Mental Status: He is alert and oriented to person, place, and time.  Psychiatric:        Attention and Perception: He is inattentive. He perceives auditory and visual hallucinations.        Mood and Affect: Mood is anxious. Affect is labile.        Speech: Speech is delayed and tangential.        Behavior: Behavior is hyperactive.        Thought Content: Thought content is paranoid and delusional. Thought content includes suicidal ideation.        Cognition and Memory: Cognition is not impaired.        Judgment: Judgment is impulsive.     ED Results / Procedures / Treatments   Labs (all labs ordered are listed, but only abnormal results are displayed) Labs Reviewed  COMPREHENSIVE METABOLIC PANEL - Abnormal; Notable for the following components:      Result Value   CO2 21 (*)    Glucose, Bld 132 (*)    Creatinine, Ser 2.12 (*)    Total Protein 8.9 (*)    AST 50 (*)    Total Bilirubin 2.1 (*)    GFR calc non Af Amer 38 (*)    GFR calc Af Amer 44 (*)    Anion gap 18 (*)    All other components within normal limits  SALICYLATE LEVEL - Abnormal; Notable for the following components:   Salicylate Lvl <7.0 (*)    All other  components within normal limits  ACETAMINOPHEN LEVEL - Abnormal; Notable for the following components:   Acetaminophen (Tylenol), Serum <10 (*)    All other components within normal limits  RAPID URINE DRUG SCREEN, HOSP PERFORMED - Abnormal; Notable for the following components:  Amphetamines POSITIVE (*)    All other components within normal limits  ETHANOL  CBC    EKG None  Radiology No results found.  Procedures Procedures (including critical care time)  Medications Ordered in ED Medications  ARIPiprazole (ABILIFY) tablet 5 mg (5 mg Oral Given 01/04/20 2028)  bictegravir-emtricitabine-tenofovir AF (BIKTARVY) 50-200-25 MG per tablet 1 tablet (has no administration in time range)  levETIRAcetam (KEPPRA) tablet 500 mg (500 mg Oral Given 01/04/20 2029)  sertraline (ZOLOFT) tablet 50 mg (50 mg Oral Given 01/04/20 2029)  sodium chloride 0.9 % bolus 2,000 mL (has no administration in time range)  OLANZapine zydis (ZYPREXA) disintegrating tablet 5 mg (5 mg Oral Given 01/04/20 2029)    And  LORazepam (ATIVAN) tablet 1 mg (1 mg Oral Given 01/04/20 2028)    ED Course  I have reviewed the triage vital signs and the nursing notes.  Pertinent labs & imaging results that were available during my care of the patient were reviewed by me and considered in my medical decision making (see chart for details).    MDM Rules/Calculators/A&P                          Patient here brought in by [employer] after methamphetamine intoxication, agitated and paranoid behavior.  He was tased twice.  Work-up revealed acute kidney injury.  Patient's gotten 2 L of fluid.  He will need repeat vital signs.  We will recheck a BMP.  If his kidney function improving that he can be medically cleared for psych evaluation.  If it is not improving then would add a CK and admit for AKI.  He has received medications as listed above.  I discussed the case with PA up still who will assume care of the patient. Final Clinical  Impression(s) / ED Diagnoses Final diagnoses:  None    Rx / DC Orders ED Discharge Orders    None       Arthor Captain, PA-C 01/05/20 0023    Dione Booze, MD 01/07/20 [phone]

## 2020-01-04 NOTE — ED Triage Notes (Addendum)
Patient arrived in [employer] custody from [facility] after attempting to grab officers taser/ patient also seen at [facility] ED today and states that he left prior to being seen. Patient anxious and unable to sit still, pacing in triage. Denies SI/HI. States that crowds make him nervous and states not prescribed meds.

## 2020-01-04 NOTE — ED Notes (Signed)
Per triage RN, pt requested to leave after arriving with [employer]. Pt was yelling "Fuck this shit, I'm leaving." Per triage RN, pt picked up security wand and was waving it in the air. Pt was taken escorted off premises by [employer] per staff request.

## 2020-01-04 NOTE — ED Notes (Signed)
Trial hallway bed. Pt agrees but is distant with increased paranoia, less able to follow directions in hallway. Sitter now at bedside. Attempted to meet needs (given drink, blanket). Allowed pt to go back to room.

## 2020-01-04 NOTE — ED Provider Notes (Signed)
Behavioral Health Medical Screening Exam  Justin [name] is a 39 y.o. male.  Total Time spent with patient: 20 minutes  Psychiatric Specialty Exam: Patient presented to [facility] accompanied by police. He is known to the behavioral health staff and has had several admissions in the past 6 months. His most recent admission was on 6/30 for suicidal ideations and he was transferred to [facility] on 7/1 for observation.  He was agitated and paranoid upon arrival. He was assuming a stance of aggression toward one of the male staff RN's. He was then attempting to enter the police car and when he could not he took off running. The police pursued him and returned him to [facility]. Initially, the police stated he was not IVC'able because he was not a danger to himself. Once he was returned to [facility] he became very aggressive and reached to grad one of the officers.Patient is a known methamphetamine user and appeared highly impaired at the time of his arrival to the rear [place] area at [facility]. He was subsequently tazed x 2 and placed in handcuffs. This took several police officers to achieve. EMS arrived on scene and patient was transported to the emergency room for further evaluation and possible IVC.   Presentation  General Appearance: Casual, dressed appropriately Eye Contact: Fair Speech: Pressured, Tangential Speech Volume: Loud Handedness:Right   Mood and Affect  Mood: Agitated, aggressive, combative Affect:Congruent   Thought Process  Thought Processes:Coherent  Descriptions of Associations: Unable to assess due to patient's condition Orientation:Full (Time, Place and Person)  Thought Content:WDL  Hallucinations:None  Ideas of Reference:None  Suicidal Thoughts: Unable to assess due to patient's condition Homicidal Thoughts:  Unable to assess due to patient's condition  Sensorium  Memory:  Unable to assess due to patient's condition Judgment: Poor Insight: Poor  Art therapist   Concentration:Fair  Attention Span:Fair  Recall:Fair  Fund of Knowledge:Fair  Language:Fair   Psychomotor Activity  Psychomotor Activity:Normal   Assets  Assets:  Unable to assess due to patient's condition  Sleep  Sleep:Fair  Number of hours: No data recorded  Physical Exam: Physical Exam ROS There were no vitals taken for this visit. There is no height or weight on file to calculate BMI.  Musculoskeletal: Strength & Muscle Tone: spastic, increased and aggressive Gait & Station: normal Patient leans: N/A   Recommendations:  Based on my evaluation the patient does not appear to have an emergency medical condition.   Laveda Abbe, NP 01/04/2020, 5:45 PM

## 2020-01-05 LAB — BASIC METABOLIC PANEL WITH GFR
Anion gap: 8 (ref 5–15)
BUN: 9 mg/dL (ref 6–20)
CO2: 17 mmol/L — ABNORMAL LOW (ref 22–32)
Calcium: 6.9 mg/dL — ABNORMAL LOW (ref 8.9–10.3)
Chloride: 116 mmol/L — ABNORMAL HIGH (ref 98–111)
Creatinine, Ser: 0.96 mg/dL (ref 0.61–1.24)
GFR calc Af Amer: >60 mL/min
GFR calc non Af Amer: >60 mL/min
Glucose, Bld: 75 mg/dL (ref 70–99)
Potassium: 3.1 mmol/L — ABNORMAL LOW (ref 3.5–5.1)
Sodium: 141 mmol/L (ref 135–145)

## 2020-01-05 MED ORDER — ACETAMINOPHEN 325 MG PO TABS
650.0000 mg | ORAL_TABLET | Freq: Once | ORAL | Status: AC
  Administered 2020-01-05: 650 mg via ORAL
  Filled 2020-01-05: qty 2

## 2020-01-05 MED ORDER — SODIUM CHLORIDE 0.9 % IV BOLUS
1000.0000 mL | Freq: Once | INTRAVENOUS | Status: AC
  Administered 2020-01-05: 1000 mL via INTRAVENOUS

## 2020-01-05 NOTE — BH Assessment (Signed)
Comprehensive Clinical Assessment (CCA) Screening, Triage and Referral Note  01/05/2020 DARRALL STREY [id number]    Visit Diagnosis: I77.824 Adverse effect of Amphetamines; F33.1 MDD, recurrent, moderate; Z59.1 Inadequate housing Disposition: Berneice Heinrich, NP recommends psychiatric clearance.   Pt presents involuntarily to [facility] via [employer]. Pt was at [facility] for continuous observation 01/02/20-01/03/20. He later presented last evening to [facility] for assessment but became physically aggressive toward police and taken to [facility]. This morning pt states he is feeling remorseful for his behavior. He states he wants to stop using drugs that he has not had previous substance abuse tx. Pt reports non-compliance with psychiatric medication rx. Pt denies homicidal ideation. He initially reported SI with thought to overdose on unisom. Pt later stated he would be safe from self-harm if discharged from ED. Pt stated he is interested in being connected to substance abuse tx and outpt psychiatric tx. Pt's discharge information gives phone number to [facility] where pt was instructed to phone or show up to make an outpt counseling appointment tomorrow. Pt states current stressors include ongoing drug use and homelessness.   ? MSE: Pt is dressed in scrubs, alert, oriented x4 with normal speech and normal motor behavior. Eye contact is good. Pt's mood is pleasant and affect is even and constricted. Affect is congruent with mood. Thought process is coherent and relevant. There is no indication pt is currently responding to internal stimuli or experiencing delusional thought content. Pt was cooperative throughout assessment.   Disposition:  Berneice Heinrich, NP recommends psychiatric clearance.     ICD-10-CM   1. AKI (acute kidney injury) (HCC)  N17.9   2. 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 Paranoid behavior (HCC)  F22     Patient Reported Information How did you hear about [place]? Legal  System   Referral name: Aggressive toward police when they brought pt to [facility] for assessment   Whom do you see for routine medical problems? Primary Care   Practice/Facility Name: [facility]   Practice/Facility Phone Number: No data recorded  Name of Contact: No data recorded  Contact Number: [phone]   Contact Fax Number: [phone]   Prescriber Name: [facility].  How Long Has This Been Causing You Problems? 1 wk - 1 month  Have You Recently Been in Any Inpatient Treatment (Hospital/Detox/Crisis Center/28-Day Program)? No  Have You Ever Received Services From [facility] Before? Yes   Who Do You See at [facility]? ED  Have You Recently Had Any Thoughts About Hurting Yourself? Yes   Are You Planning to Commit Suicide/Harm Yourself At This time?  Yes  Have you Recently Had Thoughts About Hurting Someone [name]? No (Pt denies.)   Have You Used Any Alcohol or Drugs in the Past 24 Hours? yes What Do You Feel Would Help You the Most Today? Other (Comment);Medication;Therapy (inpatient tx)  Do You Currently Have a Therapist/Psychiatrist? No    CCA Screening Triage Referral Assessment Type of Contact: Tele-Assessment   Is this Initial or Reassessment? Initial Assessment   Date Telepsych consult ordered in [facility]:  01/05/20   Time Telepsych consult ordered in [facility]:  0906   Name and Contact of Legal Guardian:  Self  If Minor and Not Living with Parent(s), Who has Custody? Self  Is CPS involved or ever been involved? Never  Is APS involved or ever been involved? Never  Patient Determined To Be At Risk for Harm To Self  or Others Based on Review of Patient Reported Information or Presenting Complaint? Yes, for Self-Harm  Location of Assessment: [facility] ED  Does Patient Present under Involuntary Commitment? Yes  [place] of Residence: [place]   Determination of Need: Emergent (2 hours)   Options For Referral: Outpatient Therapy;Other: Comment;Mobile  Crisis (peer support consult)   Disposition: Berneice Heinrich, NP recommends psychiatric clearance.  Zachrey Deutscher Suzan Nailer, LCSW

## 2020-01-05 NOTE — ED Notes (Signed)
Pt angry and throwing pills on floor. Pt calling nurse " F...[name]'Bitch'

## 2020-01-05 NOTE — ED Notes (Signed)
Pt's breakfast arrived 

## 2020-01-05 NOTE — ED Notes (Signed)
Lunch tray ordered 

## 2020-01-05 NOTE — ED Notes (Signed)
Patient verbalizes understanding of discharge instructions. Opportunity for questioning and answers were provided. Armband removed by staff, pt discharged from ED ambulatory.   

## 2020-01-05 NOTE — ED Notes (Signed)
Lunch Tray Ordered @ 1040. 

## 2020-01-05 NOTE — ED Notes (Signed)
TC to [facility] for TTS assessment.  Plan to start the eval.

## 2020-01-05 NOTE — ED Notes (Signed)
TTS in progress 

## 2020-01-05 NOTE — ED Provider Notes (Addendum)
Care of the patient assumed at the change of shift. TTS not able to complete assessment early this morning so will call back and attempt again. Patient is belligerent, cursing and throwing his medications at the RN. Will try to contact TTS, patient advised that any disruptive, aggressive or unsafe behavior would require sedation.    Pollyann Savoy, MD 01/05/20 [phone]  1:39 PM Patient evaluated by TTS who recommend psych clearance, reversal of IVC and outpatient followup at [facility].    Pollyann Savoy, MD 01/05/20 1340

## 2020-01-05 NOTE — BHH Counselor (Signed)
Clinician attempted to call pt's nurse to see if the pt is ready to be assessed however clinician was on hold for over six minutes before ending the call.   Clinician noted the pt is in the hall, pt has to be placed in a private room for assessment.   Please call clinician once the pt is placed in a private room and is ready to be assessed.   Redmond Pulling, MS, [facility], [facility] Triage Specialist [phone].

## 2020-01-05 NOTE — ED Notes (Signed)
IVC paperwork rescinded by Dr Bernette Mayers - Copy faxed to [facility] - Copy sent to Medical Records - Original placed in folder for [facility].

## 2020-01-06 ENCOUNTER — Other Ambulatory Visit: Payer: Self-pay | Admitting: Student in an Organized Health Care Education/Training Program

## 2020-01-07 ENCOUNTER — Ambulatory Visit (HOSPITAL_COMMUNITY)
Admission: EM | Admit: 2020-01-07 | Discharge: 2020-01-07 | Disposition: A | Discharge status: Other Acute Inpatient Hospital | Payer: No Payment, Other | Attending: Psychiatry | Admitting: Psychiatry

## 2020-01-07 ENCOUNTER — Encounter (HOSPITAL_COMMUNITY): Payer: Self-pay

## 2020-01-07 ENCOUNTER — Emergency Department (HOSPITAL_COMMUNITY)
Admission: EM | Admit: 2020-01-07 | Discharge: 2020-01-08 | Disposition: A | Discharge status: Home / Self Care | Payer: Self-pay | Attending: Emergency Medicine | Admitting: Emergency Medicine

## 2020-01-07 DIAGNOSIS — F332 Major depressive disorder, recurrent severe without psychotic features: Secondary | ICD-10-CM | POA: Insufficient documentation

## 2020-01-07 DIAGNOSIS — Z79899 Other long term (current) drug therapy: Secondary | ICD-10-CM | POA: Insufficient documentation

## 2020-01-07 DIAGNOSIS — R569 Unspecified convulsions: Secondary | ICD-10-CM | POA: Insufficient documentation

## 2020-01-07 DIAGNOSIS — F22 Delusional disorders: Secondary | ICD-10-CM | POA: Insufficient documentation

## 2020-01-07 DIAGNOSIS — R410 Disorientation, unspecified: Secondary | ICD-10-CM | POA: Insufficient documentation

## 2020-01-07 DIAGNOSIS — Z20822 Contact with and (suspected) exposure to covid-19: Secondary | ICD-10-CM | POA: Insufficient documentation

## 2020-01-07 DIAGNOSIS — F1590 Other stimulant use, unspecified, uncomplicated: Secondary | ICD-10-CM | POA: Insufficient documentation

## 2020-01-07 DIAGNOSIS — R443 Hallucinations, unspecified: Secondary | ICD-10-CM

## 2020-01-07 DIAGNOSIS — F17210 Nicotine dependence, cigarettes, uncomplicated: Secondary | ICD-10-CM | POA: Insufficient documentation

## 2020-01-07 DIAGNOSIS — Z21 Asymptomatic human immunodeficiency virus [HIV] infection status: Secondary | ICD-10-CM | POA: Insufficient documentation

## 2020-01-07 DIAGNOSIS — F23 Brief psychotic disorder: Secondary | ICD-10-CM

## 2020-01-07 LAB — SALICYLATE LEVEL: Salicylate Lvl: <7 mg/dL — ABNORMAL LOW (ref 7.0–30.0)

## 2020-01-07 LAB — COMPREHENSIVE METABOLIC PANEL
ALT: 23 U/L (ref 0–44)
AST: 35 U/L (ref 15–41)
Albumin: 4 g/dL (ref 3.5–5.0)
Alkaline Phosphatase: 77 U/L (ref 38–126)
Anion gap: 10 (ref 5–15)
BUN: 5 mg/dL — ABNORMAL LOW (ref 6–20)
CO2: 25 mmol/L (ref 22–32)
Calcium: 9.2 mg/dL (ref 8.9–10.3)
Chloride: 107 mmol/L (ref 98–111)
Creatinine, Ser: 1.06 mg/dL (ref 0.61–1.24)
GFR calc Af Amer: >60 mL/min (ref >60)
GFR calc non Af Amer: >60 mL/min (ref >60)
Glucose, Bld: 114 mg/dL — ABNORMAL HIGH (ref 70–99)
Potassium: 3.4 mmol/L — ABNORMAL LOW (ref 3.5–5.1)
Sodium: 142 mmol/L (ref 135–145)
Total Bilirubin: 1.5 mg/dL — ABNORMAL HIGH (ref 0.3–1.2)
Total Protein: 7.1 g/dL (ref 6.5–8.1)

## 2020-01-07 LAB — CBC WITH DIFFERENTIAL/PLATELET
Abs Immature Granulocytes: 0.01 10*3/uL (ref 0.00–0.07)
Basophils Absolute: 0 10*3/uL (ref 0.0–0.1)
Basophils Relative: 0 %
Eosinophils Absolute: 0 10*3/uL (ref 0.0–0.5)
Eosinophils Relative: 0 %
HCT: 43 % (ref 39.0–52.0)
Hemoglobin: 14.3 g/dL (ref 13.0–17.0)
Immature Granulocytes: 0 %
Lymphocytes Relative: 36 %
Lymphs Abs: 1.8 10*3/uL (ref 0.7–4.0)
MCH: 32.2 pg (ref 26.0–34.0)
MCHC: 33.3 g/dL (ref 30.0–36.0)
MCV: 96.8 fL (ref 80.0–100.0)
Monocytes Absolute: 0.5 10*3/uL (ref 0.1–1.0)
Monocytes Relative: 10 %
Neutro Abs: 2.6 10*3/uL (ref 1.7–7.7)
Neutrophils Relative %: 54 %
Platelets: 188 10*3/uL (ref 150–400)
RBC: 4.44 MIL/uL (ref 4.22–5.81)
RDW: 11.8 % (ref 11.5–15.5)
WBC: 4.9 10*3/uL (ref 4.0–10.5)
nRBC: 0 % (ref 0.0–0.2)

## 2020-01-07 LAB — ACETAMINOPHEN LEVEL: Acetaminophen (Tylenol), Serum: <10 ug/mL — ABNORMAL LOW (ref 10–30)

## 2020-01-07 LAB — ETHANOL: Alcohol, Ethyl (B): <10 mg/dL (ref <10)

## 2020-01-07 MED ORDER — HALOPERIDOL LACTATE 5 MG/ML IJ SOLN
5.0000 mg | Freq: Once | INTRAMUSCULAR | Status: AC
  Administered 2020-01-07: 5 mg via INTRAMUSCULAR
  Filled 2020-01-07: qty 1

## 2020-01-07 NOTE — ED Triage Notes (Signed)
Pt arrived w/ [employer] today from [facility] after aggressive behavior. Pt recently seen here as IVC that was rescinded and discharged.

## 2020-01-07 NOTE — BHH Counselor (Addendum)
Clinician called TTS cart for pt however the cart continued to ring/no answer. Clinician will check back if no answer she will move to the next consult in que.   Clinician gave registration the fax number [phone]) to [facility] due not getting pt's IVC paperwork in Onbase.    Please call when the pt is able to be seen.   Redmond Pulling, MS, [facility], [facility] Triage Specialist [phone].

## 2020-01-07 NOTE — ED Notes (Signed)
Pt agitated, refusing to sit in stretcher. Pt remains in handcuffs, [employer] present.

## 2020-01-07 NOTE — BHH Counselor (Signed)
Pt's IVC paperwork to be faxed.     Redmond Pulling, MS, [facility], [facility] Triage Specialist [phone].

## 2020-01-07 NOTE — BHH Counselor (Signed)
Clinician called back no answer on TTS cart. Clinician to move to next in que. Please call when pt is able to be assessed. Pt's IVC paperwork has not been received.    Redmond Pulling, MS, [facility], [facility] Triage Specialist [phone].

## 2020-01-07 NOTE — ED Provider Notes (Signed)
[name] EMERGENCY DEPARTMENT Provider Note   CSN: [id number] Arrival date & time: 01/07/20  1306     History Chief Complaint  Patient presents with  . Aggressive Behavior    Justin [name] is a 39 y.o. male history HIV, seizures, hepatitis C, polysubstance abuse.  Patient arrives today with police for aggressive behavioral, sent over from behavioral health Hospital.  Level 5 caveat aggressive behavior and psychosis. HPI     Past Medical History:  Diagnosis Date  . Endocarditis   . Hepatitis C   . History of syphilis 08/25/2014   Treated by [facility] 07-2014.  [name] HIV (human immunodeficiency virus infection) (HCC)   . Seizures [facility])     Patient Active Problem List   Diagnosis Date Noted  . Substance use disorder 08/07/2019  . HIV disease (HCC) 08/07/2019  . Acute kidney injury (HCC) 08/07/2019  . Acute encephalopathy 08/06/2019  . Chronic hepatitis C without hepatic coma (HCC) 08/17/2017  . Screening examination for venereal disease 05/03/2017  . Encounter for long-term (current) use of high-risk medication 05/03/2017  . Anxiety 09/26/2016  . Substance abuse (HCC) 09/26/2016  . Human immunodeficiency virus (HIV) disease (HCC) 09/02/2014  . History of syphilis 08/25/2014    Past Surgical History:  Procedure Laterality Date  . hand surgery Right   . LUMBAR PUNCTURE  08/08/2019           Family History  Adopted: Yes  Family history unknown: Yes    Social History   Tobacco Use  . Smoking status: Current Every Day Smoker    Types: Cigarettes  . Smokeless tobacco: Never Used  Vaping Use  . Vaping Use: Never used  Substance Use Topics  . Alcohol use: Yes    Comment: 1-2 a week   . Drug use: Yes    Frequency: 14.0 times per week    Types: Marijuana    Home Medications Prior to Admission medications   Medication Sig Start Date End Date Taking? Authorizing Provider  ARIPiprazole (ABILIFY) 5 MG tablet Take 1 tablet (5 mg total) by mouth  daily. 12/06/19   [name], MD  bictegravir-emtricitabine-tenofovir AF (BIKTARVY) 50-200-25 MG TABS tablet Take 1 tablet by mouth daily. 11/18/19   [name], MD  levETIRAcetam (KEPPRA) 500 MG tablet Take 1 tablet (500 mg total) by mouth 2 (two) times daily. 08/09/19   Tyson Alias, MD  sertraline (ZOLOFT) 50 MG tablet Take 1 tablet (50 mg total) by mouth daily. 12/06/19   [name], MD  Sofosbuvir-Velpatasvir (EPCLUSA) 400-100 MG TABS Take 1 tablet by mouth daily. Patient not taking: Reported on 11/29/2019 03/08/19   [name], MD    Allergies    Patient has no known allergies.  Review of Systems   Review of Systems  Unable to perform ROS: Psychiatric disorder    Physical Exam Updated Vital Signs There were no vitals taken for this visit.  Physical Exam Constitutional:      Appearance: He is well-developed and normal weight. He is not ill-appearing or toxic-appearing.  HENT:     Head: Normocephalic and atraumatic.     Jaw: There is normal jaw occlusion.     Right Ear: External ear normal.     Left Ear: External ear normal.     Nose: Nose normal.  Eyes:     General: Vision grossly intact. Gaze aligned appropriately.     Extraocular Movements: Extraocular movements intact.  Neck:  Trachea: Trachea and phonation normal.  Musculoskeletal:     Cervical back: Normal range of motion and neck supple.     Comments: Moves extremities x4 spontaneously without pain.  Normal gait.  Is pointed straight to hospitalist and  Neurological:     Mental Status: He is alert.     Comments: Speech is clear and goal oriented, follows commands Major Cranial nerves without deficit, no facial droop  Psychiatric:        Mood and Affect: Affect is labile.        Speech: Speech is rapid and pressured.        Behavior: Behavior is agitated and aggressive.        Thought Content: Thought content is paranoid.     ED Results / Procedures / Treatments   Labs (all labs  ordered are listed, but only abnormal results are displayed) Labs Reviewed  SARS CORONAVIRUS 2 BY RT PCR (HOSPITAL ORDER, PERFORMED IN [facility])  COMPREHENSIVE METABOLIC PANEL  ETHANOL  RAPID URINE DRUG SCREEN, HOSP PERFORMED  CBC WITH DIFFERENTIAL/PLATELET  ACETAMINOPHEN LEVEL  SALICYLATE LEVEL    EKG None  Radiology No results found.  Procedures Procedures (including critical care time)  Medications Ordered in ED Medications  haloperidol lactate (HALDOL) injection 5 mg (5 mg Intramuscular Given 01/07/20 1353)    ED Course  I have reviewed the triage vital signs and the nursing notes.  Pertinent labs & imaging results that were available during my care of the patient were reviewed by me and considered in my medical decision making (see chart for details).    MDM Rules/Calculators/A&P                          Additional History Obtained: 1. Nursing notes from this visit. 2. Law enforcement at bedside provided additional history. 3. Reviewed behavioral health nurse practitioner note from today, it appears patient was sent to this emergency department for medical clearance. - Patient agitated aggressive and pacing around the emergency department, verbally redirected by law enforcement.  He is concerned about a crack in the sky.  Medical clearance labs ordered.  Due to acute psychosis patient appears to be danger to himself and others, 5 mg Haldol was given intramuscularly.  IVC paperwork completed.  Patient will need reevaluation and TTS evaluation follow-up on labs.  Care handoff given to Sophia Caccavale PA-C at shift change. Disposition per oncoming team.  Note: Portions of this report may have been transcribed using voice recognition software. Every effort was made to ensure accuracy; however, inadvertent computerized transcription errors may still be present. Final Clinical Impression(s) / ED Diagnoses Final diagnoses:  None    Rx / DC Orders ED  Discharge Orders    None       Elizabeth Palau 01/07/20 1509    Benjiman Core, MD 01/07/20 [phone]

## 2020-01-07 NOTE — ED Provider Notes (Signed)
Behavioral Health Medical Screening Exam  Justin [name] is a 39 y.o. male.  Patient presents to the [facility] with [employer].  Reporting that he had contacted the police department because he stated "discussed falling and there is a hole in the O zone."  Patient was refusing to answer questions to the staff and was not answering questions for the officers.  Patient refused to get his vital signs checked.  I went to assess the patient and he denied using any drugs and when asked why he was brought to the crisis center the patient reported "because the sky is falling and there is a hole in the O zone and I need to fix it."  Patient would make minimal eye contact and appeared to be paranoid.  Difficult to decide if patient is having hallucinations but is delusional about the sky falling.  Patient refused to answer some questions, but did not stated that he was prescribed Keppra and that he had a seizure earlier today.  Patient agreed to go to the emergency department to be evaluated for medical clearance.  Patient will be evaluated by TTS staff once he is there and medically cleared.  Total Time spent with patient: 20 minutes  Psychiatric Specialty Exam  Presentation  General Appearance:Disheveled  Eye Contact:Fleeting  Speech:Normal Rate  Speech Volume:Normal  Handedness:Right   Mood and Affect  Mood:Dysphoric  Affect:Blunt   Thought Process  Thought Processes:Disorganized  Descriptions of Associations:Circumstantial  Orientation:None  Thought Content:Illogical;Delusions;Paranoid Ideation;Rumination  Hallucinations:Other (comment) (Would not answer)  Ideas of Reference:Paranoia;Delusions  Suicidal Thoughts:No With Intent;With Plan  Homicidal Thoughts:No   Sensorium  Memory:Immediate Poor;Recent Poor;Remote Poor  Judgment:Impaired  Insight:Lacking   Executive Functions  Concentration:Poor  Attention Span:Poor  Recall:Poor  Fund of  Knowledge:Poor  Language:Fair   Psychomotor Activity  Psychomotor Activity:Decreased   Assets  Assets:Desire for Improvement   Sleep  Sleep:Fair  Number of hours: No data recorded  Physical Exam: Physical Exam Nursing note reviewed.  Constitutional:      Appearance: He is well-developed.  Pulmonary:     Effort: Pulmonary effort is normal.  Musculoskeletal:        General: Normal range of motion.  Skin:    General: Skin is dry.  Neurological:     Mental Status: He is alert. He is disoriented.  Psychiatric:        Mood and Affect: Affect is blunt.        Speech: Speech is delayed.        Thought Content: Thought content is paranoid and delusional.        Cognition and Memory: Cognition is impaired. Memory is impaired.    Review of Systems  Constitutional: Negative.   HENT: Negative.   Eyes: Negative.   Respiratory: Negative.   Cardiovascular: Negative.   Gastrointestinal: Negative.   Genitourinary: Negative.   Musculoskeletal: Negative.   Skin: Negative.   Neurological:       Reported having a seizure today  Endo/Heme/Allergies: Negative.   Psychiatric/Behavioral: Positive for hallucinations.       Paranoid, delusions   There were no vitals taken for this visit. There is no height or weight on file to calculate BMI.  Musculoskeletal: Strength & Muscle Tone: within normal limits Gait & Station: normal Patient leans: N/A   Recommendations:  Based on my evaluation the patient appears to have an emergency medical condition for which I recommend the patient be transferred to the emergency department for further evaluation.  Gerlene Burdock Macenzie Burford,  FNP 01/07/2020, 12:47 PM

## 2020-01-07 NOTE — ED Provider Notes (Signed)
  Physical Exam  There were no vitals taken for this visit.  Physical Exam  ED Course/Procedures     Procedures  MDM   Pt signed out to me by B Olevia Bowens, PA-C. Please see previous note for further history.   In brief, pt sent to the ED for medical clearance from [facility]. Pt presenting with hallucinations and delusions. He was aggressive with staff, requiring sedation with haldol. Pt under IVC. pending screening labs and TTS placement.   On chart review, pt was seen in the ED 2 days ago for similar. He was cleared and instructed to f/u with the UC, which is where he went today before being sent back to the ED.   Pending tts.   The patient has been placed in psychiatric observation due to the need to provide a safe environment for the patient while obtaining psychiatric consultation and evaluation, as well as ongoing medical and medication management to treat the patient's condition.  The patient has been placed under full IVC at this time.          Alveria Apley, PA-C 01/07/20 2336    Maia Plan, MD 01/10/20 1348

## 2020-01-07 NOTE — BHH Counselor (Addendum)
Per Gabriel Rung, RN pt to be placed in private room in about 20 minutes. IVC paperwork still pending.   [facility] fax: [phone].   Redmond Pulling, MS, [facility], [facility] Triage Specialist [phone].

## 2020-01-08 LAB — RAPID URINE DRUG SCREEN, HOSP PERFORMED
Amphetamines: POSITIVE — AB
Barbiturates: NOT DETECTED
Benzodiazepines: NOT DETECTED
Cocaine: NOT DETECTED
Opiates: NOT DETECTED
Tetrahydrocannabinol: POSITIVE — AB

## 2020-01-08 LAB — SARS CORONAVIRUS 2 BY RT PCR (HOSPITAL ORDER, PERFORMED IN ~~LOC~~ HOSPITAL LAB): SARS Coronavirus 2: NEGATIVE

## 2020-01-08 MED ORDER — LORAZEPAM 2 MG/ML IJ SOLN
1.0000 mg | Freq: Once | INTRAMUSCULAR | Status: AC
  Administered 2020-01-08: 1 mg via INTRAMUSCULAR
  Filled 2020-01-08: qty 1

## 2020-01-08 MED ORDER — HALOPERIDOL LACTATE 5 MG/ML IJ SOLN
INTRAMUSCULAR | Status: AC
  Filled 2020-01-08: qty 1

## 2020-01-08 MED ORDER — DROPERIDOL 2.5 MG/ML IJ SOLN
5.0000 mg | Freq: Once | INTRAMUSCULAR | Status: AC
  Administered 2020-01-08: 5 mg via INTRAMUSCULAR
  Filled 2020-01-08: qty 2

## 2020-01-08 NOTE — BHH Counselor (Signed)
Spoke to [employer], Charity fundraiser to set up cart in 10 minutes.    Redmond Pulling, MS, [facility], [facility] Triage Specialist [phone].

## 2020-01-08 NOTE — Discharge Planning (Signed)
RNCM made aware of disposition plan of [facility] per bed meeting.  RNCM left message with Peer Support as pt is interested in detox (meth) as well as rehab.  Will update team with recommendations and plan.

## 2020-01-08 NOTE — Discharge Planning (Signed)
Peer support Arlys John) at bedside.

## 2020-01-08 NOTE — ED Notes (Signed)
Breakfast ordered 

## 2020-01-08 NOTE — Care Management (Signed)
Patient being discharged denies any SI,HI, AH Safe transport arranged to transport to [facility] arranged by daytime TOC staff. Reviewed discharge paper verbalized understanding offered opportunity for questions no additional questions or concerns voiced. Patient escorted by sitter to waiting area for safe transport ride.

## 2020-01-08 NOTE — Discharge Planning (Signed)
RNCM arranged safe transport to [facility].

## 2020-01-08 NOTE — ED Notes (Signed)
Pt up to bathroom.

## 2020-01-08 NOTE — ED Notes (Addendum)
Pt wanded by security and bed searched. Pt allowed to have phone for 1 hour. Per security.

## 2020-01-08 NOTE — ED Notes (Signed)
IV rescind paperwork faxed

## 2020-01-08 NOTE — ED Notes (Signed)
Belongings returned to pt

## 2020-01-08 NOTE — ED Notes (Signed)
Pt is lucid this morning; reports that the meth is what made him "crazy" and he "thought people in hospital had bats and were going to break his bones" yesterday but feels lot better today. He reports he was off meth for while but then got back on it. Reassessment occurring now.

## 2020-01-08 NOTE — ED Notes (Signed)
Per bed meeting: [facility]

## 2020-01-08 NOTE — ED Notes (Signed)
Security at bedside

## 2020-01-08 NOTE — ED Notes (Signed)
IVC has been rescinded 

## 2020-01-08 NOTE — ED Notes (Signed)
Pt given sandwich bag and srite

## 2020-01-08 NOTE — BH Assessment (Signed)
Comprehensive Clinical Assessment (CCA) Note  01/08/2020 Justin [name] [id number]  Justin [name] is a 39 year old male who presents involuntary and unaccompanied to [facility]. Clinician asked the pt, "what brought you to the hospital?" Pt reported, "I thought the sky was falling, there was a crack in the atmosphere for about a week." Pt reported, he fears the sun, the moon and stars are going to crash into Earth. Pt reported, he wants to get it over with and kill himself, by running into traffic or blowing his brains out. Pt reported, he can get access to guns. Pt reported, he's not able to maintain housing in fear of the sky falling. Pt reported, he's been sleeping in parking lots. Pt reported, he was kicked out of his residence because they thought he was going to hurt himself. Pt reported, he think people in the hospital are trying to hit him with a baseball bat. Pt reported, he can hear the clinking of the bat. Pt denies, HI, self-injurious behaviors.    Per IVC paperwork: "Patient is aggressive and agitated with visual hallucinations and delusions of a hole in the sky. He is uncooperative with staff and law enforcement. Reports of methamphetamine use. Currently acutely psychotic appears a danger to himself and others.   Pt presents, tearful at times, with logical, coherent speech. Pt's eye contact was good. Pt's mood was pleasant, depressed. Pt's affect was congruent with mood. Pt's judgement was impaired. Pt's insight and impulse control was poor. Pt reported, if discharged from [facility] he could not contract for safety.  Disposition: Renaye Rakers, NP recommends overnight observation. Disposition discussed with Justin Rung, RN.   Diagnosis: Major Depressive Disorder, recurrent, severe with psychotic features.                     Amphetamine-type substance use Disorder, Moderate.  PHQ9 SCORE ONLY 01/08/2020 02/27/2019 11/07/2017  PHQ-9 Total Score 27 0 0   Visit Diagnosis:   No diagnosis found.    CCA  Screening, Triage and Referral (STR)  Patient Reported Information How did you hear about [place]? Other (Comment) ([employer].)  Referral name: [employer].  Referral phone number: No data recorded  Whom do you see for routine medical problems? I don't have a doctor  Practice/Facility Name: [facility]  Practice/Facility Phone Number: No data recorded Name of Contact: No data recorded Contact Number: [phone]  Contact Fax Number: [phone]  Prescriber Name: [facility].  Prescriber Address (if known): No data recorded  What Is the Reason for Your Visit/Call Today? No data recorded How Long Has This Been Causing You Problems? > than 6 months  What Do You Feel Would Help You the Most Today? Other (Comment);Medication;Therapy (inpatient tx)   Have You Recently Been in Any Inpatient Treatment (Hospital/Detox/Crisis Center/28-Day Program)? Yes  Name/Location of Program/Hospital:Pt was at [facility] a few days ago for continuing observation unit. Pt was seen at [facility] on 01/05/2020.  How Long Were You There? Overnight.  When Were You Discharged? 01/03/20 (Pt was discharged the same day (01/05/2020).)   Have You Ever Received Services From [facility] Before? Yes  Who Do You See at [facility]? Pt was seen at [facility] and [facility].   Have You Recently Had Any Thoughts About Hurting Yourself? Yes  Are You Planning to Commit Suicide/Harm Yourself At This time? Yes   Have you Recently Had Thoughts About Hurting Someone Justin [name]? No (Pt denies.)  Explanation: No data recorded  Have You Used Any Alcohol  or Drugs in the Past 24 Hours? Yes  How Long Ago Did You Use Drugs or Alcohol? No data recorded What Did You Use and How Much? Methamphetamines.   Do You Currently Have a Therapist/Psychiatrist? No (Pt denies.)  Name of Therapist/Psychiatrist: No data recorded  Have You Been Recently Discharged From Any Office Practice or Programs? No  Explanation of Discharge From  Practice/Program: No data recorded    CCA Screening Triage Referral Assessment Type of Contact: Tele-Assessment  Is this Initial or Reassessment? Initial Assessment  Date Telepsych consult ordered in [facility]:  01/07/20  Time Telepsych consult ordered in [facility]:  1637   Patient Reported Information Reviewed? Yes  Patient Left Without Being Seen? No data recorded Reason for Not Completing Assessment: No data recorded  Collateral Involvement: None.   Does Patient Have a Automotive engineer Guardian? No data recorded Name and Contact of Legal Guardian: Self  If Minor and Not Living with Parent(s), Who has Custody? Self  Is CPS involved or ever been involved? Never  Is APS involved or ever been involved? Never   Patient Determined To Be At Risk for Harm To Self or Others Based on Review of Patient Reported Information or Presenting Complaint? Yes, for Self-Harm  Method: No data recorded Availability of Means: No data recorded Intent: No data recorded Notification Required: No data recorded Additional Information for Danger to Others Potential: No data recorded Additional Comments for Danger to Others Potential: No data recorded Are There Guns or Other Weapons in Your Home? No data recorded Types of Guns/Weapons: No data recorded Are These Weapons Safely Secured?                            No data recorded Who Could Verify You Are Able To Have These Secured: No data recorded Do You Have any Outstanding Charges, Pending Court Dates, Parole/Probation? No data recorded Contacted To Inform of Risk of Harm To Self or Others: No data recorded  Location of Assessment: [facility] ED   Does Patient Present under Involuntary Commitment? Yes  IVC Papers Initial File Date: 01/07/20   [place] of Residence: [place]   Patient Currently Receiving the Following Services: Not Receiving Services   Determination of Need: Emergent (2 hours)   Options For Referral: Other: Comment (Overnight  observation.)     CCA Biopsychosocial  Intake/Chief Complaint:     Mental Health Symptoms Depression:     Mania:     Anxiety:      Psychosis:     Trauma:     Obsessions:     Compulsions:     Inattention:     Hyperactivity/Impulsivity:     Oppositional/Defiant Behaviors:     Emotional Irregularity:     Other Mood/Personality Symptoms:      Mental Status Exam Appearance and self-care  Stature:     Weight:     Clothing:     Grooming:     Cosmetic use:     Posture/gait:     Motor activity:     Sensorium  Attention:     Concentration:     Orientation:     Recall/memory:     Affect and Mood  Affect:     Mood:     Relating  Eye contact:     Facial expression:     Attitude toward examiner:     Thought and Language  Speech flow:    Thought content:  Preoccupation:     Hallucinations:     Organization:     Company secretaryxecutive Functions  Fund of Knowledge:     Intelligence:     Abstraction:     Judgement:     Reality Testing:     Insight:     Decision Making:     Social Functioning  Social Maturity:     Social Judgement:     Stress  Stressors:     Coping Ability:     Skill Deficits:     Supports:       Religion:    Leisure/Recreation:    Exercise/Diet:    CCA Employment/Education  Employment/Work Situation:    Education:    CCA Family/Childhood History  Family and Relationship History:    Childhood History:     Child/Adolescent Assessment:     CCA Substance Use  Alcohol/Drug Use: Alcohol / Drug Use Pain Medications: See MAR Prescriptions: See MAR Over the Counter: See MAR History of alcohol / drug use?: Yes Substance #1 Name of Substance 1: Methamhetamines. 1 - Age of First Use: UTA 1 - Amount (size/oz): Per pt, using a gram every two weeks. 1 - Frequency: UTA 1 - Duration: UTA 1 - Last Use / Amount: UTA  ASAM's:  Six Dimensions of Multidimensional Assessment  Dimension 1:  Acute Intoxication and/or Withdrawal Potential:       Dimension 2:  Biomedical Conditions and Complications:      Dimension 3:  Emotional, Behavioral, or Cognitive Conditions and Complications:     Dimension 4:  Readiness to Change:     Dimension 5:  Relapse, Continued use, or Continued Problem Potential:     Dimension 6:  Recovery/Living Environment:     ASAM Severity Score:    ASAM Recommended Level of Treatment:     Substance use Disorder (SUD)    Recommendations for Services/Supports/Treatments:    DSM5 Diagnoses: Patient Active Problem List   Diagnosis Date Noted  . Substance use disorder 08/07/2019  . HIV disease (HCC) 08/07/2019  . Acute kidney injury (HCC) 08/07/2019  . Acute encephalopathy 08/06/2019  . Chronic hepatitis C without hepatic coma (HCC) 08/17/2017  . Screening examination for venereal disease 05/03/2017  . Encounter for long-term (current) use of high-risk medication 05/03/2017  . Anxiety 09/26/2016  . Substance abuse (HCC) 09/26/2016  . Human immunodeficiency virus (HIV) disease (HCC) 09/02/2014  . History of syphilis 08/25/2014    Referrals to Alternative Service(s): Referred to Alternative Service(s):   Place:   Date:   Time:    Referred to Alternative Service(s):   Place:   Date:   Time:    Referred to Alternative Service(s):   Place:   Date:   Time:    Referred to Alternative Service(s):   Place:   Date:   Time:     Redmond Pullingreylese D Avaiah Stempel  Comprehensive Clinical Assessment (CCA) Screening, Triage and Referral Note  01/08/2020 Mitzie NaQuadyr L Demonbreun [id number]  Visit Diagnosis: No diagnosis found.  Patient Reported Information How did you hear about us? Other (Comment) ([employer].)   Referral name: [employer].   Referral phone number: No data recorded Whom do you see for routine medical problems? I don't have a doctor   Practice/Facility Name: [facility]   Practice/Facility Phone Number: No data recorded  Name of Contact: No data recorded  Contact Number: [phone]   Contact Fax Number:  [phone]   Prescriber Name: [facility].   Prescriber Address (if known): No data recorded What  Is the Reason for Your Visit/Call Today? No data recorded How Long Has This Been Causing You Problems? > than 6 months  Have You Recently Been in Any Inpatient Treatment (Hospital/Detox/Crisis Center/28-Day Program)? Yes   Name/Location of Program/Hospital:Pt was at [facility] a few days ago for continuing observation unit. Pt was seen at [facility] on 01/05/2020.   How Long Were You There? Overnight.   When Were You Discharged? 01/03/20 (Pt was discharged the same day (01/05/2020).)  Have You Ever Received Services From [employer] [facility] Before? Yes   Who Do You See at [facility]? Pt was seen at [facility] and [facility].  Have You Recently Had Any Thoughts About Hurting Yourself? Yes   Are You Planning to Commit Suicide/Harm Yourself At This time?  Yes  Have you Recently Had Thoughts About Hurting Someone Justin [name]? No (Pt denies.)   Explanation: No data recorded Have You Used Any Alcohol or Drugs in the Past 24 Hours? Yes   How Long Ago Did You Use Drugs or Alcohol?  No data recorded  What Did You Use and How Much? Methamphetamines.  What Do You Feel Would Help You the Most Today? Other (Comment);Medication;Therapy (inpatient tx)  Do You Currently Have a Therapist/Psychiatrist? No (Pt denies.)   Name of Therapist/Psychiatrist: No data recorded  Have You Been Recently Discharged From Any Office Practice or Programs? No   Explanation of Discharge From Practice/Program:  No data recorded    CCA Screening Triage Referral Assessment Type of Contact: Tele-Assessment   Is this Initial or Reassessment? Initial Assessment   Date Telepsych consult ordered in [facility]:  01/07/20   Time Telepsych consult ordered in [facility]:  1637  Patient Reported Information Reviewed? Yes   Patient Left Without Being Seen? No data recorded  Reason for Not Completing Assessment: No data recorded Collateral  Involvement: None.  Does Patient Have a Automotive engineer Guardian? No data recorded  Name and Contact of Legal Guardian:  Self  If Minor and Not Living with Parent(s), Who has Custody? Self  Is CPS involved or ever been involved? Never  Is APS involved or ever been involved? Never  Patient Determined To Be At Risk for Harm To Self or Others Based on Review of Patient Reported Information or Presenting Complaint? Yes, for Self-Harm   Method: No data recorded  Availability of Means: No data recorded  Intent: No data recorded  Notification Required: No data recorded  Additional Information for Danger to Others Potential:  No data recorded  Additional Comments for Danger to Others Potential:  No data recorded  Are There Guns or Other Weapons in Your Home?  No data recorded   Types of Guns/Weapons: No data recorded   Are These Weapons Safely Secured?                              No data recorded   Who Could Verify You Are Able To Have These Secured:    No data recorded Do You Have any Outstanding Charges, Pending Court Dates, Parole/Probation? No data recorded Contacted To Inform of Risk of Harm To Self or Others: No data recorded Location of Assessment: [facility] ED  Does Patient Present under Involuntary Commitment? Yes   IVC Papers Initial File Date: 01/07/20   [place] of Residence: [place]  Patient Currently Receiving the Following Services: Not Receiving Services   Determination of Need: Emergent (2 hours)   Options For Referral: Other: Comment (Overnight observation.)  Redmond Pulling, [facility]   Redmond Pulling, MS, [facility], [facility] Triage Specialist [phone]

## 2020-01-08 NOTE — Progress Notes (Addendum)
Patient ID: Justin [name], male   DOB: [birth year], 39 y.o.   MRN: [id number]   Psychiatric reassessment   HPI: Justin [name] is a 39 year old male who presents involuntary and unaccompanied to [facility]. Clinician asked the pt, "what brought you to the hospital?" Pt reported, "I thought the sky was falling, there was a crack in the atmosphere for about a week." Pt reported, he fears the sun, the moon and stars are going to crash into Earth. Pt reported, he wants to get it over with and kill himself, by running into traffic or blowing his brains out. Pt reported, he can get access to guns. Pt reported, he's not able to maintain housing in fear of the sky falling. Pt reported, he's been sleeping in parking lots. Pt reported, he was kicked out of his residence because they thought he was going to hurt himself. Pt reported, he think people in the hospital are trying to hit him with a baseball bat. Pt reported, he can hear the clinking of the bat. Pt denies, HI, self-injurious behaviors.    Per IVC paperwork: "Patient is aggressive and agitated with visual hallucinations and delusions of a hole in the sky. He is uncooperative with staff and law enforcement. Reports of methamphetamine use. Currently acutely psychotic appears a danger to himself and others.   Psychiatric evaluation: Justin [name] is a 39 year old male who presented involuntary to [facility] with concerns as noted above. During this evaluation, he is pleasant, alert and oriented x4, calm and cooperative. He acknowledges his reason for admission stating," I am here because I thought the sky was falling. I said that there was a crack in the atmosphere along with other crazy things. I said that I wanted to kill myself but that wasn't true I was just using drugs." He admitted to smoking," 3/4 bowls" of meth yesterday. He stated that for the past week, he has been smoking meth daily although he has a long history of meth use. He denies active SI with  plan or intent although stated," sometimes I think if I have to continue to live like this, doing meth and living wherever, then I am better off dead." He stated that he has no place to live which is another significant  stressor and when he lives on the street, he engages in more of the drug use. He denies, HI, self-injurious behaviors and current AVH however notes that when he uses meth, he experience hallucinations. He reported a very distant psychiatric hospitalization during his childhood. He denied having current outpatient psychiatric services. He denied access to guns. Reported his substance abuse history is also significant for alcohol however, reports his alcohol use is infrequent. Stated he is open to rehab or other substance abuse treatment. He denied other concern at this time.   Disposition: Patient denies SI.HI, and current psychosis. He does not appear internally or externally preoccupied or in acute distress. He reported that his psychosis was secondary to his use of methamphetamines which he used prior to presenting to the ED. He stated that he was homeless and identified this as a stressor. We discussed [employer] and he was receptive to going to receive care. CSW to work on this recommendation along with providing other community resources as appropriate. At this time, there is no evidence of imminent risk to self or others at present.  Patient does not meet criteria for psychiatric inpatient admission and is psychiatrically cleared with the above treatment  plan.

## 2020-01-08 NOTE — ED Notes (Signed)
Pt with personal cell phone, pt aware that he is not supposed to have this. Requested phone to secure with belongings. Pt agitated and refusing. Security notified. Will notify MD.

## 2020-01-08 NOTE — ED Notes (Signed)
Pt gave this RN his phone. Verified name and DOB with pt to pt label. Cell phone secured in personal belongings bag in locker 14, inventory sheet updated to reflect this.

## 2020-01-16 ENCOUNTER — Other Ambulatory Visit: Payer: Self-pay | Admitting: Student in an Organized Health Care Education/Training Program

## 2020-01-27 ENCOUNTER — Telehealth: Payer: Self-pay

## 2020-01-27 NOTE — Telephone Encounter (Signed)
-----   Message from [facility] sent at 01/27/2020 12:12 PM EDT ----- Patient called to move appointment to an earlier day, but I informed patient Dr. [name] does not have anything until 8/16. York Spaniel he is have sinus congestion and it could be because he was off his medication. If someone could give him a call to see what he can do in the meantime [phone]

## 2020-01-27 NOTE — Telephone Encounter (Signed)
Called patient to follow up on message from front desk. Patient not available at this time. Not able to leave voicemail.  Justin [name], [place]

## 2020-02-13 ENCOUNTER — Other Ambulatory Visit: Payer: Self-pay

## 2020-02-13 ENCOUNTER — Encounter: Payer: Self-pay | Admitting: Internal Medicine

## 2020-02-13 ENCOUNTER — Ambulatory Visit (INDEPENDENT_AMBULATORY_CARE_PROVIDER_SITE_OTHER): Payer: Self-pay | Admitting: Internal Medicine

## 2020-02-13 VITALS — BP 144/89 | HR 110 | Wt 202.0 lb

## 2020-02-13 DIAGNOSIS — F1990 Other psychoactive substance use, unspecified, uncomplicated: Secondary | ICD-10-CM

## 2020-02-13 DIAGNOSIS — G9340 Encephalopathy, unspecified: Secondary | ICD-10-CM

## 2020-02-13 DIAGNOSIS — Z79899 Other long term (current) drug therapy: Secondary | ICD-10-CM

## 2020-02-13 DIAGNOSIS — Z113 Encounter for screening for infections with a predominantly sexual mode of transmission: Secondary | ICD-10-CM

## 2020-02-13 DIAGNOSIS — B182 Chronic viral hepatitis C: Secondary | ICD-10-CM

## 2020-02-13 DIAGNOSIS — F419 Anxiety disorder, unspecified: Secondary | ICD-10-CM

## 2020-02-13 DIAGNOSIS — B20 Human immunodeficiency virus [HIV] disease: Secondary | ICD-10-CM

## 2020-02-13 MED ORDER — LEVETIRACETAM 500 MG PO TABS: 500.0000 mg | ORAL_TABLET | Freq: Two times a day (BID) | ORAL | 5 refills | Status: DC

## 2020-02-13 MED ORDER — BIKTARVY 50-200-25 MG PO TABS: 1.0000 | ORAL_TABLET | Freq: Every day | ORAL | 5 refills | Status: DC

## 2020-02-14 ENCOUNTER — Encounter: Payer: Self-pay | Admitting: Internal Medicine

## 2020-02-14 LAB — T-HELPER CELL (CD4) - (RCID CLINIC ONLY)
CD4 % Helper T Cell: 33 % (ref 33–65)
CD4 T Cell Abs: 429 /uL (ref 400–1790)

## 2020-02-14 LAB — URINE CYTOLOGY ANCILLARY ONLY
Chlamydia: NEGATIVE
Comment: NEGATIVE
Comment: NORMAL
Neisseria Gonorrhea: NEGATIVE

## 2020-02-14 NOTE — Assessment & Plan Note (Signed)
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  I will have him referred to neurology for evaluation.

## 2020-02-14 NOTE — Assessment & Plan Note (Signed)
Unclear if he completed treatment and will recheck his RNA next visit.

## 2020-02-14 NOTE — Progress Notes (Signed)
   Subjective:    Patient ID: Justin [name], male    DOB: 07-24-1980, 39 y.o.   MRN: [id number]  HPI He is here after a long absence for follow up of HIV I last saw him 1 year ago and he continued to struggle with drug abuse and this has been an ongoing issue.  He is back now and feels he is in a good place to get back on track.  He is here with a friend who is a CMA and is helping him with his appointments and medication.  He has not restarted.  He has been off and on Biktarvy over the last year.  He is not seeing counseling or psychiatry.  He has been having seizures by his report and has been off his Keppra.  He is having issues with memory.     Review of Systems  Constitutional: Negative for fatigue and unexpected weight change.  Gastrointestinal: Negative for diarrhea and nausea.  Skin: Negative for rash.  Neurological: Positive for seizures.       Memory issues.        Objective:   Physical Exam Eyes:     General: No scleral icterus. Cardiovascular:     Rate and Rhythm: Normal rate and regular rhythm.     Heart sounds: No murmur heard.   Pulmonary:     Effort: Pulmonary effort is normal. No respiratory distress.  Skin:    Findings: No rash.  Neurological:     General: No focal deficit present.     Mental Status: He is alert and oriented to person, place, and time.  Psychiatric:        Mood and Affect: Mood normal.    SH: no current drug use       Assessment & Plan:

## 2020-02-14 NOTE — Assessment & Plan Note (Addendum)
He is currently poorly controlled, symptomatic.  He will go ahead and restart the Biktarvy and will get him back in about 1 month.  I will check his labs today.  I discussed resistance and will be checking next visit on medication.

## 2020-02-14 NOTE — Assessment & Plan Note (Signed)
Will screen 

## 2020-02-14 NOTE — Assessment & Plan Note (Signed)
Will get him in to our counselor.

## 2020-02-16 LAB — CBC WITH DIFFERENTIAL/PLATELET
Absolute Monocytes: 510 cells/uL (ref 200–950)
Basophils Absolute: 41 cells/uL (ref 0–200)
Basophils Relative: 0.7 %
Eosinophils Absolute: 93 cells/uL (ref 15–500)
Eosinophils Relative: 1.6 %
HCT: 47 % (ref 38.5–50.0)
Hemoglobin: 16 g/dL (ref 13.2–17.1)
Lymphs Abs: 2192 cells/uL (ref 850–3900)
MCH: 32.3 pg (ref 27.0–33.0)
MCHC: 34 g/dL (ref 32.0–36.0)
MCV: 94.8 fL (ref 80.0–100.0)
MPV: 9.9 fL (ref 7.5–12.5)
Monocytes Relative: 8.8 %
Neutro Abs: 2964 cells/uL (ref 1500–7800)
Neutrophils Relative %: 51.1 %
Platelets: 243 10*3/uL (ref 140–400)
RBC: 4.96 10*6/uL (ref 4.20–5.80)
RDW: 11.8 % (ref 11.0–15.0)
Total Lymphocyte: 37.8 %
WBC: 5.8 10*3/uL (ref 3.8–10.8)

## 2020-02-16 LAB — COMPLETE METABOLIC PANEL WITH GFR
AG Ratio: 1.6 (calc) (ref 1.0–2.5)
ALT: 17 U/L (ref 9–46)
AST: 14 U/L (ref 10–40)
Albumin: 4.2 g/dL (ref 3.6–5.1)
Alkaline phosphatase (APISO): 100 U/L (ref 36–130)
BUN: 18 mg/dL (ref 7–25)
CO2: 31 mmol/L (ref 20–32)
Calcium: 9.9 mg/dL (ref 8.6–10.3)
Chloride: 105 mmol/L (ref 98–110)
Creat: 1.32 mg/dL (ref 0.60–1.35)
GFR, Est African American: 78 mL/min/{1.73_m2} (ref >=60)
GFR, Est Non African American: 67 mL/min/{1.73_m2} (ref >=60)
Globulin: 2.7 g/dL (calc) (ref 1.9–3.7)
Glucose, Bld: 84 mg/dL (ref 65–99)
Potassium: 4.6 mmol/L (ref 3.5–5.3)
Sodium: 143 mmol/L (ref 135–146)
Total Bilirubin: 0.6 mg/dL (ref 0.2–1.2)
Total Protein: 6.9 g/dL (ref 6.1–8.1)

## 2020-02-16 LAB — RPR TITER: RPR Titer: 1:1 {titer} — ABNORMAL HIGH

## 2020-02-16 LAB — FLUORESCENT TREPONEMAL AB(FTA)-IGG-BLD: Fluorescent Treponemal ABS: REACTIVE — AB

## 2020-02-16 LAB — HIV-1 RNA QUANT-NO REFLEX-BLD
HIV 1 RNA Quant: <20 Copies/mL
HIV-1 RNA Quant, Log: <1.3 Log cps/mL

## 2020-02-16 LAB — LIPID PANEL
Cholesterol: 152 mg/dL (ref <200)
HDL: 52 mg/dL (ref >=40)
LDL Cholesterol (Calc): 80 mg/dL (calc)
Non-HDL Cholesterol (Calc): 100 mg/dL (calc) (ref <130)
Total CHOL/HDL Ratio: 2.9 (calc) (ref <5.0)
Triglycerides: 100 mg/dL (ref <150)

## 2020-02-16 LAB — RPR: RPR Ser Ql: REACTIVE — AB

## 2020-02-17 ENCOUNTER — Encounter: Payer: Self-pay | Admitting: Neurology

## 2020-02-25 ENCOUNTER — Ambulatory Visit: Payer: Self-pay

## 2020-02-25 ENCOUNTER — Other Ambulatory Visit: Payer: Self-pay

## 2020-02-25 ENCOUNTER — Ambulatory Visit (HOSPITAL_COMMUNITY)
Admission: EM | Admit: 2020-02-25 | Discharge: 2020-02-25 | Disposition: A | Discharge status: Home / Self Care | Payer: No Payment, Other | Attending: Psychiatry | Admitting: Psychiatry

## 2020-02-25 DIAGNOSIS — Z1330 Encounter for screening examination for mental health and behavioral disorders, unspecified: Secondary | ICD-10-CM | POA: Insufficient documentation

## 2020-02-25 DIAGNOSIS — F1990 Other psychoactive substance use, unspecified, uncomplicated: Secondary | ICD-10-CM

## 2020-02-25 DIAGNOSIS — F1994 Other psychoactive substance use, unspecified with psychoactive substance-induced mood disorder: Secondary | ICD-10-CM

## 2020-02-25 DIAGNOSIS — F1590 Other stimulant use, unspecified, uncomplicated: Secondary | ICD-10-CM | POA: Insufficient documentation

## 2020-02-25 DIAGNOSIS — F22 Delusional disorders: Secondary | ICD-10-CM | POA: Insufficient documentation

## 2020-02-25 NOTE — ED Notes (Addendum)
Pt refused vitals 

## 2020-02-25 NOTE — ED Notes (Signed)
Pt brought in voluntarily with PD. Presenting as paranoid stating his whole family is dead. Patient is confused (adamant he has a belt on, does not; adamant that door is jammed, door is not.; etc.). Pt continues to shut assessment room door and attempted to shut door behind Clinical research associate.   Writer continues to stand outside assessment room to monitor pt for safety.

## 2020-02-25 NOTE — ED Provider Notes (Signed)
Behavioral Health Medical Screening Exam  Justin [name] is a 39 y.o. male.  Patient presented back for second time today via [employer].  After patient's first visit he had left walking to the parking lot and per report had walked over to the police department a couple hours later.  Patient came in willingly with the police department and finally admitted to using methamphetamines last night.  Patient continued to deny any suicidal or homicidal ideations and denied any hallucinations.  Patient speech is clear he does have some minor bizarre behaviors.  He reports that he does not feel that he needs to be admitted to the hospital he just needs to stop using methamphetamines.  Patient did request for assistance with getting to [place], [place].  Police Department agreed to take patient to this area because he had friends to stay with.  Patient reported that he was also concerned because he had intercourse with another male and he did not tell them that he was HIV positive.  Patient continued to deny any suicidal or homicidal ideations and denied any hallucinations at this time.  Patient was discharged to the police department with transportation home.  Total Time spent with patient: 20 minutes  Psychiatric Specialty Exam  Presentation  General Appearance:Disheveled  Eye Contact:Fair  Speech:Clear and Coherent;Normal Rate  Speech Volume:Normal  Handedness:Right   Mood and Affect  Mood:Anxious  Affect:Appropriate   Thought Process  Thought Processes:Coherent  Descriptions of Associations:Intact  Orientation:Full (Time, Place and Person)  Thought Content:Paranoid Ideation  Hallucinations:None  Ideas of Reference:None  Suicidal Thoughts:No With Intent;With Plan  Homicidal Thoughts:No   Sensorium  Memory:Immediate Fair;Recent Fair;Remote Fair  Judgment:Fair  Insight:Fair   Executive Functions  Concentration:Fair  Attention Span:Fair  Recall:Fair  Fund of  Knowledge:Fair  Language:Good   Psychomotor Activity  Psychomotor Activity:Increased   Assets  Assets:Housing;Desire for Improvement   Sleep  Sleep:Fair  Number of hours: No data recorded  Physical Exam: Physical Exam Vitals and nursing note reviewed.  Constitutional:      Appearance: He is well-developed.  Pulmonary:     Effort: Pulmonary effort is normal.  Musculoskeletal:        General: Normal range of motion.  Skin:    General: Skin is warm.  Neurological:     Mental Status: He is alert and oriented to person, place, and time.    Review of Systems  Constitutional: Negative.   HENT: Negative.   Eyes: Negative.   Respiratory: Negative.   Cardiovascular: Negative.   Gastrointestinal: Negative.   Genitourinary: Negative.   Musculoskeletal: Negative.   Skin: Negative.   Neurological: Negative.   Endo/Heme/Allergies: Negative.   Psychiatric/Behavioral: Positive for substance abuse.   There were no vitals taken for this visit. There is no height or weight on file to calculate BMI.  Musculoskeletal: Strength & Muscle Tone: within normal limits Gait & Station: normal Patient leans: N/A   Recommendations:  Based on my evaluation the patient does not appear to have an emergency medical condition.  Gerlene Burdock Ashea Winiarski, FNP 02/25/2020, 11:12 AM

## 2020-02-25 NOTE — Discharge Instructions (Addendum)
Take medications as prescribed

## 2020-02-25 NOTE — ED Provider Notes (Signed)
Behavioral Health Medical Screening Exam  Justin [name] is a 39 y.o. male.  Patient is a 39 year old male that presented voluntarily with [employer].  Patient is calm and cooperative and goes to and he closes the door behind him and states that he needs a medically cleared stable.  He states that someone.  Patient is given time to clear his mind and he states that he will discuss with [place].  That he has not seen his mother in a long time and feels that she may have been murdered.  The patient is able to tell me his address where he lives and states that he lives with friends.  Patient is alert and oriented and does appear to be slightly paranoid.  However patient does not endorse any suicidal or homicidal ideations and does not appear to be responding to any internal or external stimuli.  Patient does not endorse having any hallucinations.  Patient has a known history of methamphetamine use.  The patient then states that he would like to go back out to the lobby and wait a little while before he has any more conversation with [place].  Patient is not presenting with any type of behavior that indicates need for IVC. Patient was brought to the lobby and he exits the building and goes to the parking lot.  Total Time spent with patient: 20 minutes  Psychiatric Specialty Exam  Presentation  General Appearance:Disheveled  Eye Contact:Fair  Speech:Clear and Coherent;Normal Rate  Speech Volume:Normal  Handedness:Right   Mood and Affect  Mood:Anxious  Affect:Appropriate   Thought Process  Thought Processes:Coherent  Descriptions of Associations:Intact  Orientation:Full (Time, Place and Person)  Thought Content:Paranoid Ideation  Hallucinations:None  Ideas of Reference:None  Suicidal Thoughts:No With Intent;With Plan  Homicidal Thoughts:No   Sensorium  Memory:Immediate Fair;Recent Fair;Remote Fair  Judgment:Fair  Insight:Fair   Executive Functions  Concentration:Fair  Attention  Span:Fair  Recall:Fair  Fund of Knowledge:Fair  Language:Good   Psychomotor Activity  Psychomotor Activity:Increased   Assets  Assets:Housing;Desire for Improvement   Sleep  Sleep:Fair  Number of hours: No data recorded  Physical Exam: Physical Exam Vitals and nursing note reviewed.  Constitutional:      Appearance: He is well-developed.  Cardiovascular:     Rate and Rhythm: Normal rate.  Pulmonary:     Effort: Pulmonary effort is normal.  Musculoskeletal:        General: Normal range of motion.  Skin:    General: Skin is warm.  Neurological:     Mental Status: He is alert and oriented to person, place, and time.    Review of Systems  Constitutional: Negative.   HENT: Negative.   Eyes: Negative.   Respiratory: Negative.   Cardiovascular: Negative.   Gastrointestinal: Negative.   Genitourinary: Negative.   Musculoskeletal: Negative.   Skin: Negative.   Neurological: Negative.   Endo/Heme/Allergies: Negative.    Resp. rate 20. There is no height or weight on file to calculate BMI.  Musculoskeletal: Strength & Muscle Tone: within normal limits Gait & Station: normal Patient leans: N/A   Recommendations:  Based on my evaluation the patient does not appear to have an emergency medical condition.  Justin Burdock Kaydon Husby, FNP 02/25/2020, 8:14 AM

## 2020-02-25 NOTE — BHH Counselor (Signed)
Patient returned to [facility] with police escort. Patient with documented history of malingering. Patient states he used meth around 12:00 PM. Patient has moments of clarity while speaking. It is unclear if he is understanding writers questions however does state he does not want to be here. Patient does not meet IVC criteria at this time. Patient to be transported to his brother's home via [employer].
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
## 2020-03-06 ENCOUNTER — Emergency Department (HOSPITAL_COMMUNITY)
Admission: EM | Admit: 2020-03-06 | Discharge: 2020-03-06 | Disposition: A | Discharge status: Home / Self Care | Payer: Self-pay | Attending: Emergency Medicine | Admitting: Emergency Medicine

## 2020-03-06 ENCOUNTER — Ambulatory Visit (HOSPITAL_COMMUNITY)
Admission: EM | Admit: 2020-03-06 | Discharge: 2020-03-06 | Disposition: A | Discharge status: Other Acute Inpatient Hospital | Payer: No Payment, Other

## 2020-03-06 ENCOUNTER — Other Ambulatory Visit: Payer: Self-pay

## 2020-03-06 DIAGNOSIS — Z5321 Procedure and treatment not carried out due to patient leaving prior to being seen by health care provider: Secondary | ICD-10-CM | POA: Insufficient documentation

## 2020-03-06 DIAGNOSIS — Z008 Encounter for other general examination: Secondary | ICD-10-CM | POA: Insufficient documentation

## 2020-03-06 NOTE — ED Provider Notes (Signed)
Patient presented to [facility] voluntarily transported by Patent examiner.  Attempted to complete MSE and assessment however patient unable to participate in assessment at this time.  Patient presents with restless behavior and inability to participate in assessment.  Patient noted to have no shoes and no pants.  Patient appears to have paranoid ideations states, "I want to go somewhere safe."  Patient appears to be under the influence of substance.  Patient would benefit from medical clearance in emergency department. Law enforcement officers verbalized plan to transport patient to emergency department, patient verbalizes understanding and agreement. Patient reviewed with Dr. Nelly Rout.  Report provided to [facility] emergency department physician, Dr Criss Alvine.

## 2020-03-17 ENCOUNTER — Encounter: Payer: Self-pay | Admitting: Internal Medicine

## 2020-03-19 ENCOUNTER — Ambulatory Visit: Payer: Self-pay | Admitting: Internal Medicine

## 2020-03-20 ENCOUNTER — Emergency Department
Admission: EM | Admit: 2020-03-20 | Discharge: 2020-03-21 | Disposition: A | Discharge status: Home / Self Care | Payer: Self-pay | Attending: Emergency Medicine | Admitting: Emergency Medicine

## 2020-03-20 ENCOUNTER — Other Ambulatory Visit: Payer: Self-pay

## 2020-03-20 DIAGNOSIS — R451 Restlessness and agitation: Secondary | ICD-10-CM | POA: Insufficient documentation

## 2020-03-20 DIAGNOSIS — F600 Paranoid personality disorder: Secondary | ICD-10-CM | POA: Insufficient documentation

## 2020-03-20 DIAGNOSIS — F19959 Other psychoactive substance use, unspecified with psychoactive substance-induced psychotic disorder, unspecified: Secondary | ICD-10-CM

## 2020-03-20 DIAGNOSIS — F1914 Other psychoactive substance abuse with psychoactive substance-induced mood disorder: Secondary | ICD-10-CM | POA: Insufficient documentation

## 2020-03-20 DIAGNOSIS — F17210 Nicotine dependence, cigarettes, uncomplicated: Secondary | ICD-10-CM | POA: Insufficient documentation

## 2020-03-20 LAB — COMPREHENSIVE METABOLIC PANEL
ALT: 46 U/L — ABNORMAL HIGH (ref 0–44)
AST: 61 U/L — ABNORMAL HIGH (ref 15–41)
Albumin: 5 g/dL (ref 3.5–5.0)
Alkaline Phosphatase: 89 U/L (ref 38–126)
Anion gap: 26 — ABNORMAL HIGH (ref 5–15)
BUN: 13 mg/dL (ref 6–20)
CO2: 12 mmol/L — ABNORMAL LOW (ref 22–32)
Calcium: 9.9 mg/dL (ref 8.9–10.3)
Chloride: 99 mmol/L (ref 98–111)
Creatinine, Ser: 1.38 mg/dL — ABNORMAL HIGH (ref 0.61–1.24)
GFR calc Af Amer: >60 mL/min (ref >60)
GFR calc non Af Amer: >60 mL/min (ref >60)
Glucose, Bld: 150 mg/dL — ABNORMAL HIGH (ref 70–99)
Potassium: 3.4 mmol/L — ABNORMAL LOW (ref 3.5–5.1)
Sodium: 137 mmol/L (ref 135–145)
Total Bilirubin: 3.3 mg/dL — ABNORMAL HIGH (ref 0.3–1.2)
Total Protein: 8.8 g/dL — ABNORMAL HIGH (ref 6.5–8.1)

## 2020-03-20 LAB — CBC
HCT: 50.5 % (ref 39.0–52.0)
Hemoglobin: 17.5 g/dL — ABNORMAL HIGH (ref 13.0–17.0)
MCH: 32.6 pg (ref 26.0–34.0)
MCHC: 34.7 g/dL (ref 30.0–36.0)
MCV: 94 fL (ref 80.0–100.0)
Platelets: 214 10*3/uL (ref 150–400)
RBC: 5.37 MIL/uL (ref 4.22–5.81)
RDW: 12.5 % (ref 11.5–15.5)
WBC: 8.5 10*3/uL (ref 4.0–10.5)
nRBC: 0 % (ref 0.0–0.2)

## 2020-03-20 LAB — ETHANOL: Alcohol, Ethyl (B): <10 mg/dL

## 2020-03-20 LAB — SALICYLATE LEVEL: Salicylate Lvl: <7 mg/dL — ABNORMAL LOW (ref 7.0–30.0)

## 2020-03-20 LAB — ACETAMINOPHEN LEVEL: Acetaminophen (Tylenol), Serum: <10 ug/mL — ABNORMAL LOW (ref 10–30)

## 2020-03-20 LAB — CK: Total CK: 401 U/L — ABNORMAL HIGH (ref 49–397)

## 2020-03-20 MED ORDER — SODIUM CHLORIDE 0.9 % IV BOLUS
1000.0000 mL | Freq: Once | INTRAVENOUS | Status: AC
  Administered 2020-03-20: 1000 mL via INTRAVENOUS

## 2020-03-20 MED ORDER — LORAZEPAM 2 MG/ML IJ SOLN
2.0000 mg | Freq: Once | INTRAMUSCULAR | Status: AC
  Administered 2020-03-20: 2 mg via INTRAMUSCULAR

## 2020-03-20 MED ORDER — ZIPRASIDONE MESYLATE 20 MG IM SOLR
20.0000 mg | Freq: Once | INTRAMUSCULAR | Status: AC
  Administered 2020-03-20: 20 mg via INTRAMUSCULAR

## 2020-03-20 MED ORDER — DIPHENHYDRAMINE HCL 50 MG/ML IJ SOLN
50.0000 mg | Freq: Once | INTRAMUSCULAR | Status: AC
  Administered 2020-03-20: 50 mg via INTRAMUSCULAR

## 2020-03-20 NOTE — ED Notes (Signed)
Pt willing to take geodon, ativan and benardyl. Charge Sport and exercise psychologist, Gaffer and [employer] at bedside

## 2020-03-20 NOTE — ED Notes (Signed)
Pt resting comfortably at this time, [employer] at bedside
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
## 2020-03-20 NOTE — ED Notes (Addendum)
Pt here with [employer] in no shirt, gray shorts and orange sandals. Pt dressed out by this RN and [employer]. Cellphone placed in belonging bag with pt clothes.

## 2020-03-20 NOTE — ED Notes (Signed)
Pt resting at this time, RR even and unlabored. [employer] at bedside.

## 2020-03-20 NOTE — ED Notes (Signed)
Pt resting comfortably, [employer] at bedside

## 2020-03-20 NOTE — BH Assessment (Signed)
This writer attempted to see patient, patient is currently unable to participate in assessment, will attempt at later time.
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
## 2020-03-20 NOTE — ED Notes (Signed)
Pt resting at this time, RR Even and unlabored. [employer] at bedside.
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
## 2020-03-21 MED ORDER — LEVETIRACETAM 500 MG PO TABS
500.0000 mg | ORAL_TABLET | Freq: Two times a day (BID) | ORAL | Status: DC
  Administered 2020-03-21: 500 mg via ORAL
  Filled 2020-03-21: qty 1

## 2020-03-21 MED ORDER — ZIPRASIDONE MESYLATE 20 MG IM SOLR: 20.0000 mg | Freq: Four times a day (QID) | INTRAMUSCULAR | Status: DC | PRN

## 2020-03-21 MED ORDER — BICTEGRAVIR-EMTRICITAB-TENOFOV 50-200-25 MG PO TABS
1.0000 | ORAL_TABLET | Freq: Every day | ORAL | Status: DC
  Administered 2020-03-21: 1 via ORAL
  Filled 2020-03-21: qty 1

## 2020-03-21 MED ORDER — LORAZEPAM 2 MG/ML IJ SOLN: 2.0000 mg | Freq: Four times a day (QID) | INTRAMUSCULAR | Status: DC | PRN

## 2020-03-21 MED ORDER — ZIPRASIDONE MESYLATE 20 MG IM SOLR
20.0000 mg | Freq: Once | INTRAMUSCULAR | Status: AC
  Administered 2020-03-21: 20 mg via INTRAMUSCULAR

## 2020-03-21 MED ORDER — DIPHENHYDRAMINE HCL 50 MG/ML IJ SOLN: 50.0000 mg | Freq: Four times a day (QID) | INTRAMUSCULAR | Status: DC | PRN

## 2020-03-21 NOTE — ED Notes (Signed)
Pt continues to stand in the doorway looking through window at ED staff.  Door remains closed and officer outside.

## 2020-03-21 NOTE — ED Notes (Signed)
Pt opening door and officer informs him that doors must be closed.  (Pt is flight risk).  Pt continues to open the door after officer closes it.

## 2020-03-21 NOTE — ED Notes (Signed)
Pt laying in bed calm. Door remains closed with security outside and blinds open.

## 2020-03-21 NOTE — ED Notes (Signed)
Pt given sandwich tray and drink. 

## 2020-03-21 NOTE — ED Notes (Signed)
Pt talking about police shooting him and fears that people are laughing about him outside of room. Pt continues to state that all his friends have left him because he posted about transmitting HIV and he is now alone to "mastribate on hinge."

## 2020-03-21 NOTE — ED Notes (Signed)
After talking with multiple staff, pt willingly took Geodon shot.

## 2020-03-21 NOTE — ED Provider Notes (Signed)
-----------------------------------------   4:00 AM on 03/21/2020 -----------------------------------------  Patient angry because nurse turned off the computer.  He was using the computer in the treatment room to post to his Facebook. He is increasingly agitated and unable to be verbally redirected necessitating IM calming agent.   ----------------------------------------- 5:26 AM on 03/21/2020 -----------------------------------------  Patient willingly took IM Geodon and is currently resting in no acute distress.   Irean Hong, MD 03/21/20 [phone]

## 2020-03-21 NOTE — ED Notes (Signed)
IVC/pending psych consult 

## 2020-03-21 NOTE — Final Progress Note (Signed)
Physician Final Progress Note  Patient ID: Justin [name] MRN: [id number] DOB/AGE: March 19, 1981 39 y.o.  Admit date: 03/20/2020 Admitting provider: No admitting provider for patient encounter. Discharge date: 03/21/2020   Admission Diagnoses:  Methamphetamine Intoxication and Dependence     Discharge Diagnoses:  Active Problems:   * No active hospital problems. *   Same   Consults:   TTS   Psych MD ER MD    Significant Findings/ Diagnostic Studies:  Meth positive in urine   Procedures:  Observation and rounds   Discharge Condition: { fair  Disposition:  he wants to back to friends house   Diet: { as tolerated   Discharge Activity: as tolerated    Patient has come down from the effects of methamphetamine   He now wants to go home  His acting out and all have subsided but remains with poor insight judgement reliability   But says he contracts for safety  He is not interested in rehab or recovery   Alert strange odd rehearsed  aa male at rest Fake phony persona Wants to go home  Oriented times four Consciousness not clouded or fluctuant Mood elevated and strange  No frank other psychosis or mania for thought content and process Paranoia and all subsided  No shouting spells Memory no other new change ---intact  Rapport strange eye contact strange No active SI HI or plans  Movements no shakes tremors or tics Judgement insight reliability intelligence fund of knowledge all poor  Abstraction poor  Concentration and attention fair  Speech  Normal but anxious in general   Leans not known Handedness not known Handedness not known  Musculoskeletal normal  Gait and station normal Recall fair Assets not known ADL's--no new change Akathisia none Psychomotor no elavation Cognition strange Sleep as  Usual  Aims not done         Post meth intoxication poor insight and all no desire for true recovery wants to go to friends house.     IVC rescinded   No  psych Meds  TTS notified  Will tell ER MD as well                 Total time spent taking care of this patient: 30-40 minutes  Signed: Roselind Messier 03/21/2020, 10:35 AM

## 2020-03-21 NOTE — ED Notes (Signed)
RN passing by room to see patient sitting on computer posting to Facebook. RN entered room and asked patient to log off computer, pt continued to post and delayed logging off. Rn asked again and shut down computer at which point pt became frustrated stating, he could leave if he wanted to and "you have no right to keep me here." Pt is addiment he is okay to leave but continues to escalate, taking IV out and jumping from one subject to another while talking. Pt continues to stand in doorway mimicking the movements and stance of RN. Security attempting to talk patient down and second RN at bedside to attempt talking to patient as well. MD aware.

## 2020-03-21 NOTE — ED Notes (Signed)
Pt closed blinds in room.  I opened them and pt was attempting to get onto facebook on the computer.  Pt instructed that blinds remain open and the computer is off limits.

## 2020-03-21 NOTE — ED Notes (Signed)
Pt cooperative at this time. Pt wants to see psychiatrist. Explained to pt that it will be a little while.
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
## 2020-03-22 ENCOUNTER — Emergency Department (HOSPITAL_COMMUNITY)
Admission: EM | Admit: 2020-03-22 | Discharge: 2020-03-23 | Disposition: A | Discharge status: Home / Self Care | Payer: Self-pay | Attending: Emergency Medicine | Admitting: Emergency Medicine

## 2020-03-22 ENCOUNTER — Emergency Department (HOSPITAL_COMMUNITY): Payer: Self-pay

## 2020-03-22 ENCOUNTER — Other Ambulatory Visit: Payer: Self-pay

## 2020-03-22 DIAGNOSIS — Z79899 Other long term (current) drug therapy: Secondary | ICD-10-CM | POA: Insufficient documentation

## 2020-03-22 DIAGNOSIS — F17210 Nicotine dependence, cigarettes, uncomplicated: Secondary | ICD-10-CM | POA: Insufficient documentation

## 2020-03-22 DIAGNOSIS — F1520 Other stimulant dependence, uncomplicated: Secondary | ICD-10-CM | POA: Diagnosis present

## 2020-03-22 DIAGNOSIS — F1514 Other stimulant abuse with stimulant-induced mood disorder: Secondary | ICD-10-CM | POA: Insufficient documentation

## 2020-03-22 DIAGNOSIS — F1990 Other psychoactive substance use, unspecified, uncomplicated: Secondary | ICD-10-CM | POA: Diagnosis present

## 2020-03-22 DIAGNOSIS — F1594 Other stimulant use, unspecified with stimulant-induced mood disorder: Secondary | ICD-10-CM

## 2020-03-22 DIAGNOSIS — F23 Brief psychotic disorder: Secondary | ICD-10-CM | POA: Insufficient documentation

## 2020-03-22 DIAGNOSIS — Z20822 Contact with and (suspected) exposure to covid-19: Secondary | ICD-10-CM | POA: Insufficient documentation

## 2020-03-22 LAB — RAPID URINE DRUG SCREEN, HOSP PERFORMED
Amphetamines: POSITIVE — AB
Barbiturates: NOT DETECTED
Benzodiazepines: POSITIVE — AB
Cocaine: NOT DETECTED
Opiates: NOT DETECTED
Tetrahydrocannabinol: NOT DETECTED

## 2020-03-22 LAB — CBC WITH DIFFERENTIAL/PLATELET
Abs Immature Granulocytes: 0.01 10*3/uL (ref 0.00–0.07)
Basophils Absolute: 0 10*3/uL (ref 0.0–0.1)
Basophils Relative: 0 %
Eosinophils Absolute: 0 10*3/uL (ref 0.0–0.5)
Eosinophils Relative: 0 %
HCT: 47.5 % (ref 39.0–52.0)
Hemoglobin: 16.2 g/dL (ref 13.0–17.0)
Immature Granulocytes: 0 %
Lymphocytes Relative: 22 %
Lymphs Abs: 1.6 10*3/uL (ref 0.7–4.0)
MCH: 32.5 pg (ref 26.0–34.0)
MCHC: 34.1 g/dL (ref 30.0–36.0)
MCV: 95.4 fL (ref 80.0–100.0)
Monocytes Absolute: 0.8 10*3/uL (ref 0.1–1.0)
Monocytes Relative: 11 %
Neutro Abs: 4.8 10*3/uL (ref 1.7–7.7)
Neutrophils Relative %: 67 %
Platelets: 191 10*3/uL (ref 150–400)
RBC: 4.98 MIL/uL (ref 4.22–5.81)
RDW: 12.5 % (ref 11.5–15.5)
WBC: 7.3 10*3/uL (ref 4.0–10.5)
nRBC: 0 % (ref 0.0–0.2)

## 2020-03-22 LAB — ACETAMINOPHEN LEVEL: Acetaminophen (Tylenol), Serum: <10 ug/mL — ABNORMAL LOW (ref 10–30)

## 2020-03-22 LAB — COMPREHENSIVE METABOLIC PANEL
ALT: 49 U/L — ABNORMAL HIGH (ref 0–44)
AST: 70 U/L — ABNORMAL HIGH (ref 15–41)
Albumin: 5 g/dL (ref 3.5–5.0)
Alkaline Phosphatase: 78 U/L (ref 38–126)
Anion gap: 15 (ref 5–15)
BUN: 10 mg/dL (ref 6–20)
CO2: 21 mmol/L — ABNORMAL LOW (ref 22–32)
Calcium: 9.7 mg/dL (ref 8.9–10.3)
Chloride: 101 mmol/L (ref 98–111)
Creatinine, Ser: 1.03 mg/dL (ref 0.61–1.24)
GFR calc Af Amer: >60 mL/min (ref >60)
GFR calc non Af Amer: >60 mL/min (ref >60)
Glucose, Bld: 144 mg/dL — ABNORMAL HIGH (ref 70–99)
Potassium: 3.2 mmol/L — ABNORMAL LOW (ref 3.5–5.1)
Sodium: 137 mmol/L (ref 135–145)
Total Bilirubin: 3.8 mg/dL — ABNORMAL HIGH (ref 0.3–1.2)
Total Protein: 8.1 g/dL (ref 6.5–8.1)

## 2020-03-22 LAB — SALICYLATE LEVEL: Salicylate Lvl: <7 mg/dL — ABNORMAL LOW (ref 7.0–30.0)

## 2020-03-22 LAB — SARS CORONAVIRUS 2 BY RT PCR (HOSPITAL ORDER, PERFORMED IN ~~LOC~~ HOSPITAL LAB): SARS Coronavirus 2: NEGATIVE

## 2020-03-22 LAB — ETHANOL: Alcohol, Ethyl (B): <10 mg/dL (ref <10)

## 2020-03-22 MED ORDER — LORAZEPAM 2 MG/ML IJ SOLN
2.0000 mg | Freq: Once | INTRAMUSCULAR | Status: AC
  Administered 2020-03-22: 2 mg via INTRAMUSCULAR
  Filled 2020-03-22: qty 1

## 2020-03-22 MED ORDER — DIPHENHYDRAMINE HCL 50 MG/ML IJ SOLN
50.0000 mg | Freq: Once | INTRAMUSCULAR | Status: AC
  Administered 2020-03-22: 50 mg via INTRAMUSCULAR
  Filled 2020-03-22: qty 1

## 2020-03-22 MED ORDER — ZIPRASIDONE MESYLATE 20 MG IM SOLR
20.0000 mg | Freq: Once | INTRAMUSCULAR | Status: AC
  Administered 2020-03-22: 20 mg via INTRAMUSCULAR
  Filled 2020-03-22: qty 20

## 2020-03-22 MED ORDER — LEVETIRACETAM 500 MG PO TABS
500.0000 mg | ORAL_TABLET | Freq: Two times a day (BID) | ORAL | Status: DC
  Administered 2020-03-22 – 2020-03-23 (×2): 500 mg via ORAL
  Filled 2020-03-22 (×2): qty 1

## 2020-03-22 MED ORDER — LORAZEPAM 1 MG PO TABS
2.0000 mg | ORAL_TABLET | Freq: Once | ORAL | Status: AC
  Administered 2020-03-22: 2 mg via ORAL
  Filled 2020-03-22: qty 2

## 2020-03-22 MED ORDER — ACETAMINOPHEN 500 MG PO TABS
1000.0000 mg | ORAL_TABLET | Freq: Once | ORAL | Status: AC
  Administered 2020-03-22: 1000 mg via ORAL
  Filled 2020-03-22: qty 2

## 2020-03-22 MED ORDER — HALOPERIDOL 5 MG PO TABS
5.0000 mg | ORAL_TABLET | Freq: Once | ORAL | Status: AC
  Administered 2020-03-22: 5 mg via ORAL
  Filled 2020-03-22: qty 1

## 2020-03-22 MED ORDER — BICTEGRAVIR-EMTRICITAB-TENOFOV 50-200-25 MG PO TABS
1.0000 | ORAL_TABLET | Freq: Every day | ORAL | Status: DC
  Administered 2020-03-22 – 2020-03-23 (×2): 1 via ORAL
  Filled 2020-03-22 (×2): qty 1

## 2020-03-22 MED ORDER — STERILE WATER FOR INJECTION IJ SOLN
INTRAMUSCULAR | Status: AC
  Filled 2020-03-22: qty 10
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
## 2020-03-22 NOTE — ED Notes (Signed)
Both of the patient's emergency contact numbers are not in service. RN attempted to call to give update and was unable to.

## 2020-03-22 NOTE — ED Notes (Signed)
Pt refused to change into scrubs, and get vitals taken
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
## 2020-03-23 ENCOUNTER — Encounter (HOSPITAL_COMMUNITY): Payer: Self-pay | Admitting: Registered Nurse

## 2020-03-23 DIAGNOSIS — F23 Brief psychotic disorder: Secondary | ICD-10-CM | POA: Insufficient documentation

## 2020-03-23 DIAGNOSIS — F1594 Other stimulant use, unspecified with stimulant-induced mood disorder: Secondary | ICD-10-CM

## 2020-03-23 DIAGNOSIS — F1520 Other stimulant dependence, uncomplicated: Secondary | ICD-10-CM | POA: Diagnosis present

## 2020-03-23 DIAGNOSIS — F1990 Other psychoactive substance use, unspecified, uncomplicated: Secondary | ICD-10-CM

## 2020-03-23 NOTE — Consult Note (Addendum)
[facility] Face-to-Face Psychiatry Consult   Reason for Consult:  Substance induced behavior Referring Physician:  Liberty Handy, PA-C Patient Identification: Justin [name] MRN:  [id number] Principal Diagnosis: Methamphetamine-induced mood disorder (HCC) Diagnosis:  Principal Problem:   Methamphetamine-induced mood disorder (HCC) Active Problems:   Substance use disorder   Methamphetamine use disorder, severe, dependence (HCC)   Total Time spent with patient: 30 minutes  Subjective:   Justin [name] is a 39 y.o. male patient admitted to [facility] ED after presenting under the influence of methamphetamine with erratic behavior.  HPI:  Justin [name], 39 y.o., male patient seen face to face by this provider, consulted with Dr. Lucianne Muss; and chart reviewed on 03/23/20.  On evaluation Keni L Vanasten reports he is doing fine this morning and that he is ready to go home.  Patient states that he is usually good until he does meth and then he acts out. States that he is ready to get help with his meth use now.  Patient states he lives with his mother and that he is employed.   During evaluation Bradford L Mateja is alert/oriented x 4; calm/cooperative; and mood is congruent with affect.  He does not appear to be responding to internal/external stimuli or delusional thoughts.  Patient denies suicidal/self-harm/homicidal ideation, psychosis, and paranoia.  Patient answered question appropriately.  Peer support ordered to assist with substance use services    Past Psychiatric History: Meth use disorder, substance induced mood disorder  Risk to Self:  No Risk to Others:  No Prior Inpatient Therapy:  Yes Prior Outpatient Therapy:  Yes  Past Medical History:  Past Medical History:  Diagnosis Date   Endocarditis    Hepatitis C    History of syphilis 08/25/2014   Treated by [facility] 07-2014.   HIV (human immunodeficiency virus infection) (HCC)    Seizures (HCC)     Past Surgical History:   Procedure Laterality Date   hand surgery Right    LUMBAR PUNCTURE  08/08/2019       Family History:  Family History  Adopted: Yes  Family history unknown: Yes   Family Psychiatric  History:  Unaware Social History:  Social History   Substance and Sexual Activity  Alcohol Use Yes   Comment: 1-2 a week      Social History   Substance and Sexual Activity  Drug Use Yes   Frequency: 14.0 times per week   Types: Marijuana    Social History   Socioeconomic History   Marital status: Widowed    Spouse name: Not on file   Number of children: Not on file   Years of education: Not on file   Highest education level: Not on file  Occupational History   Not on file  Tobacco Use   Smoking status: Current Every Day Smoker    Types: Cigarettes   Smokeless tobacco: Never Used  Vaping Use   Vaping Use: Never used  Substance and Sexual Activity   Alcohol use: Yes    Comment: 1-2 a week    Drug use: Yes    Frequency: 14.0 times per week    Types: Marijuana   Sexual activity: Yes    Partners: Male    Birth control/protection: Condom    Comment: given condoms  Other Topics Concern   Not on file  Social History Narrative   ** Merged History Encounter **       ** Merged History Encounter **       **  Merged History Encounter **       Social Determinants of Health   Financial Resource Strain:    Difficulty of Paying Living Expenses: Not on file  Food Insecurity:    Worried About Programme researcher, broadcasting/film/videounning Out of Food in the Last Year: Not on file   [employer] Out of Food in the Last Year: Not on file  Transportation Needs:    Lack of Transportation (Medical): Not on file   Lack of Transportation (Non-Medical): Not on file  Physical Activity:    Days of Exercise per Week: Not on file   Minutes of Exercise per Session: Not on file  Stress:    Feeling of Stress : Not on file  Social Connections:    Frequency of Communication with Friends and Family: Not on file    Frequency of Social Gatherings with Friends and Family: Not on file   Attends Religious Services: Not on file   Active Member of Clubs or Organizations: Not on file   Attends BankerClub or Organization Meetings: Not on file   Marital Status: Not on file   Additional Social History:    Allergies:  No Known Allergies  Labs:  Results for orders placed or performed during the hospital encounter of 03/22/20 (from the past 48 hour(s))  SARS Coronavirus 2 by RT PCR (hospital order, performed in [facility] lab) Nasopharyngeal Nasopharyngeal Swab     Status: None   Collection Time: 03/22/20  9:09 AM   Specimen: Nasopharyngeal Swab  Result Value Ref Range   SARS Coronavirus 2 NEGATIVE NEGATIVE    Comment: (NOTE) SARS-CoV-2 target nucleic acids are NOT DETECTED.  The SARS-CoV-2 RNA is generally detectable in upper and lower respiratory specimens during the acute phase of infection. The lowest concentration of SARS-CoV-2 viral copies this assay can detect is 250 copies / mL. A negative result does not preclude SARS-CoV-2 infection and should not be used as the sole basis for treatment or other patient management decisions.  A negative result may occur with improper specimen collection / handling, submission of specimen other than nasopharyngeal swab, presence of viral mutation(s) within the areas targeted by this assay, and inadequate number of viral copies (<250 copies / mL). A negative result must be combined with clinical observations, patient history, and epidemiological information.  Fact Sheet for Patients:   BoilerBrush.com.cyhttps://www.fda.gov/media/136312/download  Fact Sheet for Healthcare Providers: https://pope.com/https://www.fda.gov/media/136313/download  This test is not yet approved or  cleared by the [place] FDA and has been authorized for detection and/or diagnosis of SARS-CoV-2 by FDA under an Emergency Use Authorization (EUA).  This EUA will remain in effect (meaning this test can be  used) for the duration of the COVID-19 declaration under Section 564(b)(1) of the Act, 21 U.S.C. section 360bbb-3(b)(1), unless the authorization is terminated or revoked sooner.  Performed at [facility], [address] [place], [place] [address]   Comprehensive metabolic panel     Status: Abnormal   Collection Time: 03/22/20  9:09 AM  Result Value Ref Range   Sodium 137 135 - 145 mmol/L   Potassium 3.2 (L) 3.5 - 5.1 mmol/L   Chloride 101 98 - 111 mmol/L   CO2 21 (L) 22 - 32 mmol/L   Glucose, Bld 144 (H) 70 - 99 mg/dL    Comment: Glucose reference range applies only to samples taken after fasting for at least 8 hours.   BUN 10 6 - 20 mg/dL   Creatinine, Ser 6.041.03 0.61 - 1.24 mg/dL  Calcium 9.7 8.9 - 10.3 mg/dL   Total Protein 8.1 6.5 - 8.1 g/dL   Albumin 5.0 3.5 - 5.0 g/dL   AST 70 (H) 15 - 41 U/L   ALT 49 (H) 0 - 44 U/L   Alkaline Phosphatase 78 38 - 126 U/L   Total Bilirubin 3.8 (H) 0.3 - 1.2 mg/dL   GFR calc non Af Amer >60 >60 mL/min   GFR calc Af Amer >60 >60 mL/min   Anion gap 15 5 - 15    Comment: Performed at [facility], [address] [place], [place] [address]  Ethanol     Status: None   Collection Time: 03/22/20  9:09 AM  Result Value Ref Range   Alcohol, Ethyl (B) <10 <10 mg/dL    Comment: (NOTE) Lowest detectable limit for serum alcohol is 10 mg/dL.  For medical purposes only. Performed at [facility], [address], [place], [place] [address]   CBC with Diff     Status: None   Collection Time: 03/22/20  9:09 AM  Result Value Ref Range   WBC 7.3 4.0 - 10.5 K/uL   RBC 4.98 4.22 - 5.81 MIL/uL   Hemoglobin 16.2 13.0 - 17.0 g/dL   HCT 36.6 39 - 52 %   MCV 95.4 80.0 - 100.0 fL   MCH 32.5 26.0 - 34.0 pg   MCHC 34.1 30.0 - 36.0 g/dL   RDW 44.0 34.7 - 42.5 %   Platelets 191 150 - 400 K/uL   nRBC 0.0 0.0 - 0.2 %   Neutrophils Relative % 67 %   Neutro Abs 4.8 1.7 - 7.7 K/uL   Lymphocytes  Relative 22 %   Lymphs Abs 1.6 0.7 - 4.0 K/uL   Monocytes Relative 11 %   Monocytes Absolute 0.8 0 - 1 K/uL   Eosinophils Relative 0 %   Eosinophils Absolute 0.0 0 - 0 K/uL   Basophils Relative 0 %   Basophils Absolute 0.0 0 - 0 K/uL   Immature Granulocytes 0 %   Abs Immature Granulocytes 0.01 0.00 - 0.07 K/uL    Comment: Performed at [facility], [address], [place], [place] [address]  Salicylate level     Status: Abnormal   Collection Time: 03/22/20  9:09 AM  Result Value Ref Range   Salicylate Lvl <7.0 (L) 7.0 - 30.0 mg/dL    Comment: Performed at [facility], [address] [place], [place] [address]  Acetaminophen level     Status: Abnormal   Collection Time: 03/22/20  9:09 AM  Result Value Ref Range   Acetaminophen (Tylenol), Serum <10 (L) 10 - 30 ug/mL    Comment: (NOTE) Therapeutic concentrations vary significantly. A range of 10-30 ug/mL  may be an effective concentration for many patients. However, some  are best treated at concentrations outside of this range. Acetaminophen concentrations >150 ug/mL at 4 hours after ingestion  and >50 ug/mL at 12 hours after ingestion are often associated with  toxic reactions.  Performed at [facility], [address] [place], [place] [address]   Urine rapid drug screen (hosp performed)     Status: Abnormal   Collection Time: 03/22/20 12:04 PM  Result Value Ref Range   Opiates NONE DETECTED NONE DETECTED   Cocaine NONE DETECTED NONE DETECTED   Benzodiazepines POSITIVE (A) NONE DETECTED   Amphetamines POSITIVE (A) NONE DETECTED   Tetrahydrocannabinol NONE DETECTED NONE DETECTED   Barbiturates NONE  DETECTED NONE DETECTED    Comment: (NOTE) DRUG SCREEN FOR MEDICAL PURPOSES ONLY.  IF CONFIRMATION IS NEEDED FOR ANY PURPOSE, NOTIFY LAB WITHIN 5 DAYS.  LOWEST DETECTABLE LIMITS FOR URINE DRUG SCREEN Drug Class                     Cutoff (ng/mL) Amphetamine and  metabolites    1000 Barbiturate and metabolites    200 Benzodiazepine                 200 Tricyclics and metabolites     300 Opiates and metabolites        300 Cocaine and metabolites        300 THC                            50 Performed at [facility], [address] [place], [place] [address]     Current Facility-Administered Medications  Medication Dose Route Frequency Provider Last Rate Last Admin   bictegravir-emtricitabine-tenofovir AF (BIKTARVY) 50-200-25 MG per tablet 1 tablet  1 tablet Oral Daily Gwyneth Sprout, MD   1 tablet at 03/23/20 0756   levETIRAcetam (KEPPRA) tablet 500 mg  500 mg Oral BID Gwyneth Sprout, MD   500 mg at 03/23/20 9323   Current Outpatient Medications  Medication Sig Dispense Refill   ARIPiprazole (ABILIFY) 5 MG tablet Take 1 tablet (5 mg total) by mouth daily. (Patient not taking: Reported on 02/13/2020) 30 tablet 0   bictegravir-emtricitabine-tenofovir AF (BIKTARVY) 50-200-25 MG TABS tablet Take 1 tablet by mouth daily. (Patient not taking: Reported on 03/22/2020) 30 tablet 5   levETIRAcetam (KEPPRA) 500 MG tablet Take 1 tablet (500 mg total) by mouth 2 (two) times daily. (Patient not taking: Reported on 03/22/2020) 60 tablet 5   sertraline (ZOLOFT) 50 MG tablet Take 1 tablet (50 mg total) by mouth daily. (Patient not taking: Reported on 02/13/2020) 30 tablet 0    Musculoskeletal: Strength & Muscle Tone: within normal limits Gait & Station: normal Patient leans: N/A  Psychiatric Specialty Exam: Physical Exam Vitals and nursing note reviewed. Exam conducted with a chaperone present.  Constitutional:      Appearance: Normal appearance.  HENT:     Head: Normocephalic.  Pulmonary:     Effort: Pulmonary effort is normal.  Musculoskeletal:        General: Normal range of motion.     Cervical back: Normal range of motion.  Skin:    General: Skin is warm and dry.  Neurological:     Mental Status: He is alert.   Psychiatric:        Attention and Perception: Attention and perception normal.        Mood and Affect: Mood and affect normal.        Speech: Speech normal.        Behavior: Behavior normal. Behavior is cooperative.        Thought Content: Thought content normal.        Cognition and Memory: Cognition and memory normal.        Judgment: Judgment is impulsive.     Review of Systems  Psychiatric/Behavioral: Negative for behavioral problems, confusion, hallucinations (Denies) and self-injury. Suicidal ideas: Denies. The patient is not nervous/anxious.        Patient states his main problem is meth use "I am fine and usually get along with everybody until I do that meth."  Patient states  he is ready to get help with his substances use at this time.  Discussed peer support to assist with services.    All other systems reviewed and are negative.   Blood pressure 94/80, pulse 94, temperature 97.8 F (36.6 C), temperature source Oral, resp. rate 16, SpO2 99 %.There is no height or weight on file to calculate BMI.  General Appearance: Casual and Soiled clothing  Eye Contact:  Good  Speech:  Clear and Coherent and Normal Rate  Volume:  Normal  Mood:  "Good"   Affect:  Appropriate and Congruent  Thought Process:  Coherent, Goal Directed and Descriptions of Associations: Intact  Orientation:  Full (Time, Place, and Person)  Thought Content:  WDL  Suicidal Thoughts:  No  Homicidal Thoughts:  No  Memory:  Immediate;   Good Recent;   Good  Judgement:  Intact  Insight:  Present  Psychomotor Activity:  Normal  Concentration:  Concentration: Good and Attention Span: Good  Recall:  Good  Fund of Knowledge:  Fair  Language:  Good  Akathisia:  No  Handed:  Right  AIMS (if indicated):     Assets:  Communication Skills Desire for Improvement Housing Social Support  ADL's:  Intact  Cognition:  WNL  Sleep:        Treatment Plan Summary: Plan Peer support to assist with substance use  services  Disposition:  Psychiatrically cleared No evidence of imminent risk to self or others at present.   Patient does not meet criteria for psychiatric inpatient admission. Supportive therapy provided about ongoing stressors. Discussed crisis plan, support from social network, calling 911, coming to the Emergency Department, and calling Suicide Hotline.  Jazir Newey, NP 03/23/2020 12:11 PM

## 2020-03-23 NOTE — ED Notes (Signed)
Patient coming out into the hall stating that he is sick of waiting for peer support and that he wants his discharge papers.  Patient made ware of the fact that he is IVC'd at this time and he is not allowed to leave until an order is in.  He stated who is going to stop me.  Security and [employer] called to area to ensure patient does not leave.

## 2020-03-23 NOTE — ED Notes (Signed)
Peer support at bedside speaking with patient.

## 2020-03-23 NOTE — Patient Outreach (Signed)
ED Peer Support Specialist Patient Intake (Complete at intake & 30-60 Day Follow-up)  Name: Justin [name]  MRN: [id number]  Age: 39 y.o.   Date of Admission: 03/23/2020  Intake: Initial Comments:      Primary Reason Admitted: Psychiatric Evaluation, IVC'd   Lab values: Alcohol/ETOH: Positive Positive UDS? Yes Amphetamines: Yes Barbiturates: No Benzodiazepines: Yes Cocaine: No Opiates: No Cannabinoids: No  Demographic information: Gender: Male Ethnicity: African American Marital Status: Single Insurance Status: Uninsured/Self-pay Ecologist (Work Neurosurgeon, Physicist, medical, etc.: Yes Lives with: Friend/Rommate Living situation: House/Apartment  Reported Patient History: Patient reported health conditions: None Patient aware of HIV and hepatitis status: No  In past year, has patient visited ED for any reason? Yes  Number of ED visits: 2  Reason(s) for visit: various reasons  In past year, has patient been hospitalized for any reason? Yes  Number of hospitalizations:    Reason(s) for hospitalization:    In past year, has patient been arrested? Yes  Number of arrests:    Reason(s) for arrest:    In past year, has patient been incarcerated? No  Number of incarcerations:    Reason(s) for incarceration:    In past year, has patient received medication-assisted treatment? No  In past year, patient received the following treatments: Non-residential community treatment  In past year, has patient received any harm reduction services? No  Did this include any of the following?    In past year, has patient received care from a mental health provider for diagnosis other than SUD? No  In past year, is this first time patient has overdosed? No  Number of past overdoses:    In past year, is this first time patient has been hospitalized for an overdose? No  Number of hospitalizations for overdose(s):    Is patient currently  receiving treatment for a mental health diagnosis? No  Patient reports experiencing difficulty participating in SUD treatment: No    Most important reason(s) for this difficulty?    Has patient received prior services for treatment? Yes  In past, patient has received services from following agencies:    Plan of Care:  Suggested follow up at these agencies/treatment centers: Other (comment)  Other information: CPSS met with Pt an was able to process on what services Pt are seeking at this time. CPSS asked about the facility that Pt was to attend since the last visit. CPSS completed series of questions an was able to gain information to better understand what services CPSS can offer. CPSS discussed a few options an will issue a list of services for Pt at this time.    Justin [name], [place]  03/23/2020 12:36 PM

## 2020-03-23 NOTE — ED Notes (Signed)
Meal tray given 

## 2020-03-23 NOTE — ED Provider Notes (Addendum)
Emergency Medicine Observation Re-evaluation Note  Justin [name] is a 39 y.o. male, seen on rounds today.  Pt initially presented to the ED for complaints of Psychiatric Evaluation and IVC'd Currently, the patient is sleeping.  Physical Exam  BP 112/79   Pulse (!) 101   Temp 97.8 F (36.6 C) (Oral)   Resp 16   SpO2 100%  Physical Exam CONSTITUTIONAL: Well-appearing, NAD NEURO: Sleeping with no focal deficits ENT/NECK:  supple, no JVD CARDIO: Regular rate, well-perfused PULM: No increased work of breathing GI/GU: Nondistended MSK/SPINE:  No gross deformities, no edema SKIN:  no rash, atraumatic PSYCH: Deferred  ED Course / MDM  EKG:   I have reviewed the labs performed to date as well as medications administered while in observation.  Recent changes in the last 24 hours include none.  Plan  Current plan is for reassessment this morning by psych. Patient is under full IVC at this time.  Addendum 12:49 PM: Patient has been cleared by psych, IVC rescinded, appropriate for discharge.   Justin Sous, MD 03/23/20 0355    Justin Sous, MD 03/23/20 1249

## 2020-03-23 NOTE — BH Assessment (Signed)
[facility] Assessment Progress Note  Per [name], FNP, this pt does not require psychiatric hospitalization at this time.  Pt presents under IVC initiated by EDP Hilario Quarry, MD, which has been rescinded by Nelly Rout, MD.  Pt is psychiatrically cleared.  Discharge instructions include referral information for area substance abuse treatment providers.  Pt would also benefit from seeing Peer Support Specialists, and a peer support consult has been ordered for pt.  Pt's nurse has been notified.  Doylene Canning, MA Triage Specialist [phone]

## 2020-03-23 NOTE — Discharge Instructions (Signed)
To help you maintain a sober lifestyle, a substance abuse treatment program may be beneficial to you.  Contact one of the following facilities at your earliest opportunity to ask about enrolling: ° °RESIDENTIAL PROGRAMS: ° °     [facility] °     [address] °     [place], [place] [address] °     [phone] ° °     [facility] °     [address] °     [place], [place] [address] °     [phone] ° °     [facility] °     [address] °     , [place] [address] °     [phone] ° °     [facility] °     [address] °     [place], [place] [address] °     [phone] ° °     [facility] °     [address] °     [place], [place] [address] °     [phone] ° °OUTPATIENT PROGRAMS: ° °     [facility] °     [address] °     , [place] [address] °     [phone] °     Ask about their Substance Abuse Intensive Outpatient Program.  They also offer psychiatry/medication management and therapy.  New patients are being seen in their walk-in clinic.  Walk-in hours are Monday - Thursday from 8:00 am - 11:00 am for psychiatry, and Friday from 1:00 pm - 4:00 pm for therapy.  Walk-in patients are seen on a first come, first served basis, so try to arrive as early as possible for the best chance of being seen the same day. ° °

## 2020-03-26 ENCOUNTER — Other Ambulatory Visit: Payer: Self-pay

## 2020-03-26 ENCOUNTER — Emergency Department (HOSPITAL_COMMUNITY)
Admission: EM | Admit: 2020-03-26 | Discharge: 2020-03-27 | Disposition: A | Discharge status: Home / Self Care | Payer: Self-pay | Attending: Emergency Medicine | Admitting: Emergency Medicine

## 2020-03-26 ENCOUNTER — Encounter (HOSPITAL_COMMUNITY): Payer: Self-pay | Admitting: Emergency Medicine

## 2020-03-26 DIAGNOSIS — F22 Delusional disorders: Secondary | ICD-10-CM | POA: Insufficient documentation

## 2020-03-26 DIAGNOSIS — Z20822 Contact with and (suspected) exposure to covid-19: Secondary | ICD-10-CM | POA: Insufficient documentation

## 2020-03-26 DIAGNOSIS — F1594 Other stimulant use, unspecified with stimulant-induced mood disorder: Secondary | ICD-10-CM | POA: Diagnosis present

## 2020-03-26 DIAGNOSIS — F1924 Other psychoactive substance dependence with psychoactive substance-induced mood disorder: Secondary | ICD-10-CM | POA: Insufficient documentation

## 2020-03-26 DIAGNOSIS — Z21 Asymptomatic human immunodeficiency virus [HIV] infection status: Secondary | ICD-10-CM | POA: Insufficient documentation

## 2020-03-26 DIAGNOSIS — F17210 Nicotine dependence, cigarettes, uncomplicated: Secondary | ICD-10-CM | POA: Insufficient documentation

## 2020-03-26 LAB — CBC WITH DIFFERENTIAL/PLATELET
Abs Immature Granulocytes: 0.02 10*3/uL (ref 0.00–0.07)
Basophils Absolute: 0 10*3/uL (ref 0.0–0.1)
Basophils Relative: 1 %
Eosinophils Absolute: 0.1 10*3/uL (ref 0.0–0.5)
Eosinophils Relative: 1 %
HCT: 50.8 % (ref 39.0–52.0)
Hemoglobin: 17.1 g/dL — ABNORMAL HIGH (ref 13.0–17.0)
Immature Granulocytes: 0 %
Lymphocytes Relative: 37 %
Lymphs Abs: 2.6 10*3/uL (ref 0.7–4.0)
MCH: 32.7 pg (ref 26.0–34.0)
MCHC: 33.7 g/dL (ref 30.0–36.0)
MCV: 97.1 fL (ref 80.0–100.0)
Monocytes Absolute: 0.8 10*3/uL (ref 0.1–1.0)
Monocytes Relative: 11 %
Neutro Abs: 3.5 10*3/uL (ref 1.7–7.7)
Neutrophils Relative %: 50 %
Platelets: 233 10*3/uL (ref 150–400)
RBC: 5.23 MIL/uL (ref 4.22–5.81)
RDW: 12.2 % (ref 11.5–15.5)
WBC: 7.1 10*3/uL (ref 4.0–10.5)
nRBC: 0 % (ref 0.0–0.2)

## 2020-03-26 LAB — RESP PANEL BY RT PCR (RSV, FLU A&B, COVID)
Influenza A by PCR: NEGATIVE
Influenza B by PCR: NEGATIVE
Respiratory Syncytial Virus by PCR: NEGATIVE
SARS Coronavirus 2 by RT PCR: NEGATIVE

## 2020-03-26 LAB — ETHANOL: Alcohol, Ethyl (B): <10 mg/dL (ref <10)

## 2020-03-26 LAB — COMPREHENSIVE METABOLIC PANEL
ALT: 36 U/L (ref 0–44)
AST: 37 U/L (ref 15–41)
Albumin: 4.9 g/dL (ref 3.5–5.0)
Alkaline Phosphatase: 86 U/L (ref 38–126)
Anion gap: 15 (ref 5–15)
BUN: 13 mg/dL (ref 6–20)
CO2: 25 mmol/L (ref 22–32)
Calcium: 10.2 mg/dL (ref 8.9–10.3)
Chloride: 102 mmol/L (ref 98–111)
Creatinine, Ser: 1.15 mg/dL (ref 0.61–1.24)
GFR calc Af Amer: >60 mL/min (ref >60)
GFR calc non Af Amer: >60 mL/min (ref >60)
Glucose, Bld: 96 mg/dL (ref 70–99)
Potassium: 3.8 mmol/L (ref 3.5–5.1)
Sodium: 142 mmol/L (ref 135–145)
Total Bilirubin: 1.8 mg/dL — ABNORMAL HIGH (ref 0.3–1.2)
Total Protein: 8.7 g/dL — ABNORMAL HIGH (ref 6.5–8.1)

## 2020-03-26 LAB — RAPID URINE DRUG SCREEN, HOSP PERFORMED
Amphetamines: POSITIVE — AB
Barbiturates: NOT DETECTED
Benzodiazepines: POSITIVE — AB
Cocaine: NOT DETECTED
Opiates: NOT DETECTED
Tetrahydrocannabinol: POSITIVE — AB

## 2020-03-26 LAB — SALICYLATE LEVEL: Salicylate Lvl: <7 mg/dL — ABNORMAL LOW (ref 7.0–30.0)

## 2020-03-26 LAB — ACETAMINOPHEN LEVEL: Acetaminophen (Tylenol), Serum: <10 ug/mL — ABNORMAL LOW (ref 10–30)

## 2020-03-26 MED ORDER — ZIPRASIDONE MESYLATE 20 MG IM SOLR: 20.0000 mg | INTRAMUSCULAR | Status: DC | PRN

## 2020-03-26 NOTE — ED Provider Notes (Signed)
[name]-EMERGENCY DEPT Provider Note   CSN: [id number] Arrival date & time: 03/26/20  1910     History Chief Complaint  Patient presents with  . Psychiatric Evaluation    Justin [name] is a 39 y.o. male.   Mental Health Problem Presenting symptoms: agitation, bizarre behavior, delusional, disorganized speech, disorganized thought process and hallucinations   Degree of incapacity (severity):  Severe Onset quality:  Gradual Timing:  Constant Progression:  Worsening Treatment compliance:  Untreated Relieved by:  Nothing      Past Medical History:  Diagnosis Date  . Endocarditis   . Hepatitis C   . History of syphilis 08/25/2014   Treated by [facility] 07-2014.  [name] HIV (human immunodeficiency virus infection) (HCC)   . Seizures [facility])     Patient Active Problem List   Diagnosis Date Noted  . Methamphetamine use disorder, severe, dependence (HCC) 03/23/2020  . 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 Acute psychosis (HCC)   . Substance use disorder 08/07/2019  . Encephalopathy 08/06/2019  . Chronic hepatitis C without hepatic coma (HCC) 08/17/2017  . Screening examination for venereal disease 05/03/2017  . Encounter for long-term (current) use of high-risk medication 05/03/2017  . Anxiety 09/26/2016  . Substance abuse (HCC) 09/26/2016  . Human immunodeficiency virus (HIV) disease (HCC) 09/02/2014  . History of syphilis 08/25/2014    Past Surgical History:  Procedure Laterality Date  . hand surgery Right   . LUMBAR PUNCTURE  08/08/2019           Family History  Adopted: Yes  Family history unknown: Yes    Social History   Tobacco Use  . Smoking status: Current Every Day Smoker    Types: Cigarettes  . Smokeless tobacco: Never Used  Vaping Use  . Vaping Use: Never used  Substance Use Topics  . Alcohol use: Yes    Comment: 1-2 a week   . Drug use: Yes    Frequency: 14.0 times per week    Types: Marijuana,  Methamphetamines    Home Medications Prior to Admission medications   Medication Sig Start Date End Date Taking? Authorizing Provider  ARIPiprazole (ABILIFY) 5 MG tablet Take 1 tablet (5 mg total) by mouth daily. Patient not taking: Reported on 02/13/2020 12/06/19   [name], MD  bictegravir-emtricitabine-tenofovir AF (BIKTARVY) 50-200-25 MG TABS tablet Take 1 tablet by mouth daily. Patient not taking: Reported on 03/22/2020 02/13/20   [name], MD  levETIRAcetam (KEPPRA) 500 MG tablet Take 1 tablet (500 mg total) by mouth 2 (two) times daily. Patient not taking: Reported on 03/22/2020 02/13/20   [name], MD  sertraline (ZOLOFT) 50 MG tablet Take 1 tablet (50 mg total) by mouth daily. Patient not taking: Reported on 02/13/2020 12/06/19   [name], MD    Allergies    Patient has no known allergies.  Review of Systems   Review of Systems  Unable to perform ROS: Acuity of condition  Psychiatric/Behavioral: Positive for agitation and hallucinations.  Patient not cooperative with review of systems  Physical Exam Updated Vital Signs BP (!) 117/94   Pulse 100   Temp 97.8 F (36.6 C) (Oral)   Resp 18   SpO2 99%   Physical Exam Vitals and nursing note reviewed. Exam conducted with a chaperone present.  Constitutional:      General: He is not in acute distress.    Appearance: Normal appearance.  HENT:     Head: Normocephalic and atraumatic.  Nose: No rhinorrhea.  Eyes:     General:        Right eye: No discharge.        Left eye: No discharge.     Conjunctiva/sclera: Conjunctivae normal.  Cardiovascular:     Rate and Rhythm: Normal rate and regular rhythm.  Pulmonary:     Effort: Pulmonary effort is normal.     Breath sounds: No stridor.  Chest:    Abdominal:     General: Abdomen is flat. There is no distension.     Palpations: Abdomen is soft.  Musculoskeletal:        General: No deformity or signs of injury.  Skin:    General: Skin is  warm and dry.  Neurological:     General: No focal deficit present.     Mental Status: He is alert. Mental status is at baseline.     Motor: No weakness.  Psychiatric:        Mood and Affect: Mood normal. Affect is labile.        Speech: Speech is rapid and pressured and tangential.        Behavior: Behavior is agitated.        Thought Content: Thought content is paranoid and delusional.     ED Results / Procedures / Treatments   Labs (all labs ordered are listed, but only abnormal results are displayed) Labs Reviewed  CBC WITH DIFFERENTIAL/PLATELET - Abnormal; Notable for the following components:      Result Value   Hemoglobin 17.1 (*)    All other components within normal limits  COMPREHENSIVE METABOLIC PANEL - Abnormal; Notable for the following components:   Total Protein 8.7 (*)    Total Bilirubin 1.8 (*)    All other components within normal limits  SALICYLATE LEVEL - Abnormal; Notable for the following components:   Salicylate Lvl <7.0 (*)    All other components within normal limits  ACETAMINOPHEN LEVEL - Abnormal; Notable for the following components:   Acetaminophen (Tylenol), Serum <10 (*)    All other components within normal limits  RESP PANEL BY RT PCR (RSV, FLU A&B, COVID)  ETHANOL  RAPID URINE DRUG SCREEN, HOSP PERFORMED    EKG None  Radiology No results found.  Procedures Procedures (including critical care time)  Medications Ordered in ED Medications  ziprasidone (GEODON) injection 20 mg (has no administration in time range)    ED Course  I have reviewed the triage vital signs and the nursing notes.  Pertinent labs & imaging results that were available during my care of the patient were reviewed by me and considered in my medical decision making (see chart for details).    MDM Rules/Calculators/A&P                          Concerning behavior with delusions and manic type behavior, rapid pressured speech, delusional thoughts.  Appears to  be responding to internal stimuli.  Reports polysubstance abuse by the patient.  IVC filled out by family, first exam completed by me.  Psych labs will be sent.  Vital signs are stable he is afebrile appears to have no significant trauma.  He is stable for psychiatric evaluation pending labs  Laboratory studies for coingestions are unremarkable.  CBC unremarkable.  Chemistries unremarkable.  EKG reviewed by me shows no acute ischemic change interval abnormality or arrhythmia.  There is wandering baseline difficult to fully interpret.  He still waiting for his  UDS and viral testing, from my standpoint he is now medically cleared and awaiting psychiatric evaluation.  As needed Geodon is ordered for agitation  Final Clinical Impression(s) / ED Diagnoses Final diagnoses:  Delusion [facility])    Rx / DC Orders ED Discharge Orders    None       Sabino Donovan, MD 03/26/20 2202

## 2020-03-26 NOTE — ED Triage Notes (Signed)
Pt BIB [facility] after [employer] by friends pt Bx very Manic and hyper verbal . Pt admits to Meth use and states it makes him act out and is apologetic about his threatening and aggressive bx on arrival. Intially refusing to change into purple scrubs but was convinced  By staff to comply. Pt has rambling conversation about the death of family members but report from sheriff states pt had recent death of a close friend and has been decompensated since then.

## 2020-03-27 MED ORDER — SERTRALINE HCL 50 MG PO TABS
50.0000 mg | ORAL_TABLET | Freq: Every day | ORAL | Status: DC
  Administered 2020-03-27: 50 mg via ORAL
  Filled 2020-03-27: qty 1

## 2020-03-27 MED ORDER — ARIPIPRAZOLE 5 MG PO TABS
5.0000 mg | ORAL_TABLET | Freq: Every day | ORAL | Status: DC
  Administered 2020-03-27: 5 mg via ORAL
  Filled 2020-03-27: qty 1

## 2020-03-27 MED ORDER — BICTEGRAVIR-EMTRICITAB-TENOFOV 50-200-25 MG PO TABS
1.0000 | ORAL_TABLET | Freq: Every day | ORAL | Status: DC
  Administered 2020-03-27: 1 via ORAL
  Filled 2020-03-27: qty 1

## 2020-03-27 MED ORDER — LEVETIRACETAM 500 MG PO TABS
500.0000 mg | ORAL_TABLET | Freq: Two times a day (BID) | ORAL | Status: DC
  Administered 2020-03-27: 500 mg via ORAL
  Filled 2020-03-27: qty 1

## 2020-03-27 NOTE — ED Provider Notes (Addendum)
Emergency Medicine Observation Re-evaluation Note  Justin [name] is a 39 y.o. male, seen on rounds today.  Pt initially presented to the ED for complaints of Psychiatric Evaluation Currently, the patient is awaiting psychiatric evaluation.  Physical Exam  BP (!) 117/94   Pulse 100   Temp 97.8 F (36.6 C) (Oral)   Resp 18   SpO2 99%  Physical Exam General: Calm, resting Cardiac: Regular rate Lungs: Breathing easily, no retractions Psych: Resting, calm at this time  ED Course / MDM  EKG:EKG Interpretation  Date/Time:  Thursday March 26 2020 20:46:02 EDT Ventricular Rate:  79 PR Interval:  144 QRS Duration: 108 QT Interval:  396 QTC Calculation: 454 R Axis:   -22 Text Interpretation: Normal sinus rhythm Normal ECG Since last tracing rate slower Confirmed by Linwood Dibbles [id number]) on 03/27/2020 9:06:44 AM    I have reviewed the labs performed to date as well as medications administered while in observation.  Recent changes in the last 24 hours include increased agitation early this morning.  Security officers had to be called.  Plan  Current plan is for psychiatric evaluation, he has not been evaluated by TTS yet.    Linwood Dibbles, MD 03/27/20 0908  Pt was cleared by psychiatry.  No need for inpatient treatment.  Outpatient follow-up Ralph Dowdy, MD 03/27/20 1025

## 2020-03-27 NOTE — Progress Notes (Signed)
TOC CSW Consult request has been received. CSW attempting to follow up at present time.   CSW will continue to follow for transportation needs.  Justin [name], MSW, LCSW-A Pronouns:  She, Her, Hers                  [facility] ED Transitions of CareClinical Social Worker [email or web address] [phone]

## 2020-03-27 NOTE — BH Assessment (Signed)
Assessment Note  Justin [name] is an 39 y.o. male that presents this date with IVC. Patient denies any S/I, H/I or AVH. Patient reports he has been using methamphetamines prior to arrival and he "just got really high." Patient has been seen before for the same. Per notes patient was last assessed on 03/22/20 when he presented with similar symptoms. Patient denies any current mental health symptoms or having a OP provider. Patient denies any prior mental health history of prior attempts or gestures at self harm. Patient denies any access to firearms or history of abuse. Patient is asking to be discharged this date and reports he is not interested in any OP services to address substance abuse issues at this time.   Per Arlana Pouch NP note this date:  Patient states "I am a piece loving hippie but I had a wild weekend."  Patient assessed by nurse practitioner.  Patient alert and oriented, answers appropriately.  Patient pleasant cooperative during assessment.  Patient reports chronic use of substances including methamphetamine and other hallucinogens.  Patient reports "I know that I get paranoid when I use substances but when the intensity wears down I know that no one is out to get me."  Patient reports that his roommate who petition for involuntary commitment also uses substances, he believes that involuntary commitment was not warranted.  Patient denies suicidal and homicidal ideations.  Patient denies self-harm behaviors.  Patient denies auditory visual hallucinations.  Patient does not appear to be responding to internal stimuli and there is no evidence of delusional thought content.  Patient denies symptoms of paranoia.  Patient resides in [place] with a friend.  Patient denies access to weapons.  Patient is currently not employed but seeking employment.  Patient reports use of amphetamines and marijuana.  Patient states "I do not have a real problem, I am not doing drugs every day, I do not buy  the drugs."  Patient offered support and encouragement.  Patient declines peers support substance use consult at this time.  Patient denies readiness to stop using substances at this time.  Patient declines any person to call for collateral today.  Arlana Pouch NP is reccommended patient be discharged this date.   Diagnosis: Substance induced mood disorder  Past Medical History:  Past Medical History:  Diagnosis Date  . Endocarditis   . Hepatitis C   . History of syphilis 08/25/2014   Treated by [facility] 07-2014.  [name] HIV (human immunodeficiency virus infection) (HCC)   . Seizures (HCC)     Past Surgical History:  Procedure Laterality Date  . hand surgery Right   . LUMBAR PUNCTURE  08/08/2019        Family History:  Family History  Adopted: Yes  Family history unknown: Yes    Social History:  reports that he has been smoking cigarettes. He has never used smokeless tobacco. He reports current alcohol use. He reports current drug use. Frequency: 14.00 times per week. Drugs: Marijuana and Methamphetamines.  Additional Social History:  Alcohol / Drug Use Pain Medications: See MAR Prescriptions: See MAR Over the Counter: See MAR History of alcohol / drug use?: Yes Longest period of sobriety (when/how long): Unknown Negative Consequences of Use: Personal relationships Withdrawal Symptoms:  (Denies) Substance #1 Name of Substance 1: Amphetamines 1 - Age of First Use: 17 1 - Amount (size/oz): Varies 1 - Frequency: Varies 1 - Duration: Ongoing 1 - Last Use / Amount: Prior to arrival unknown amount  CIWA: CIWA-Ar BP: (!) 117/94 Pulse  Rate: 100 COWS:    Allergies: No Known Allergies  Home Medications: (Not in a hospital admission)   OB/GYN Status:  No LMP for male patient.  General Assessment Data Location of Assessment: [facility] ED TTS Assessment: In system Is this a Tele or Face-to-Face Assessment?: Face-to-Face Is this an Initial Assessment or a Re-assessment for this encounter?:  Initial Assessment Patient Accompanied by:: N/A Language Other than English: No What gender do you identify as?: Male Date Telepsych consult ordered in [facility]: 03/27/20 Marital status: Single Living Arrangements: Alone Can pt return to current living arrangement?: Yes Admission Status: Involuntary Petitioner: Other (Friend) Is patient capable of signing voluntary admission?: Yes Referral Source: Other Insurance type: None  Medical Screening Exam [facility] Walk-in ONLY) Medical Exam completed: Yes  Crisis Care Plan Living Arrangements: Alone Name of Psychiatrist: None Name of Therapist: None  Education Status Is patient currently in school?: No Is the patient employed, unemployed or receiving disability?: Employed  Risk to self with the past 6 months Suicidal Ideation: No Has patient been a risk to self within the past 6 months prior to admission? : No Suicidal Intent: No Has patient had any suicidal intent within the past 6 months prior to admission? : No Is patient at risk for suicide?: No Suicidal Plan?: No Has patient had any suicidal plan within the past 6 months prior to admission? : No Access to Means: No What has been your use of drugs/alcohol within the last 12 months?: Current use Previous Attempts/Gestures: No How many times?: 0 Other Self Harm Risks:  (None) Triggers for Past Attempts: Unknown Intentional Self Injurious Behavior: None Family Suicide History: No Recent stressful life event(s): Other (Comment) (Excessive SA use) Persecutory voices/beliefs?: No Depression: No Depression Symptoms:  (NA) Substance abuse history and/or treatment for substance abuse?: Yes Suicide prevention information given to non-admitted patients: Not applicable  Risk to Others within the past 6 months Homicidal Ideation: No Does patient have any lifetime risk of violence toward others beyond the six months prior to admission? : No Thoughts of Harm to Others: No Current Homicidal  Intent: No Current Homicidal Plan: No Access to Homicidal Means: No Identified Victim: NA History of harm to others?: No Assessment of Violence: None Noted Violent Behavior Description: NA Does patient have access to weapons?: No Criminal Charges Pending?: No Describe Pending Criminal Charges: Dub Amis Does patient have a court date: Yes Court Date: 04/15/20 Is patient on probation?: No  Psychosis Hallucinations: None noted Delusions: None noted  Mental Status Report Appearance/Hygiene: In scrubs Eye Contact: Good Motor Activity: Freedom of movement Speech: Logical/coherent Level of Consciousness: Alert Mood: Pleasant Affect: Appropriate to circumstance Anxiety Level: Minimal Thought Processes: Coherent, Relevant Judgement: Partial Orientation: Person, Place, Time Obsessive Compulsive Thoughts/Behaviors: None  Cognitive Functioning Concentration: Normal Memory: Recent Intact, Remote Intact Is patient IDD: No Insight: Fair Impulse Control: Fair Appetite: Good Have you had any weight changes? : No Change Sleep: Decreased Total Hours of Sleep: 4 Vegetative Symptoms: None  ADLScreening [facility] Assessment Services) Patient's cognitive ability adequate to safely complete daily activities?: Yes Patient able to express need for assistance with ADLs?: Yes Independently performs ADLs?: Yes (appropriate for developmental age)  Prior Inpatient Therapy Prior Inpatient Therapy: No  Prior Outpatient Therapy Prior Outpatient Therapy: No Does patient have an ACCT team?: No Does patient have Intensive In-House Services?  : No Does patient have [facility] services? : No Does patient have P4CC services?: No  ADL Screening (condition at time of admission) Patient's cognitive ability  adequate to safely complete daily activities?: Yes Is the patient deaf or have difficulty hearing?: No Does the patient have difficulty seeing, even when wearing glasses/contacts?: No Does the  patient have difficulty concentrating, remembering, or making decisions?: No Patient able to express need for assistance with ADLs?: Yes Does the patient have difficulty dressing or bathing?: No Independently performs ADLs?: Yes (appropriate for developmental age) Does the patient have difficulty walking or climbing stairs?: No Weakness of Legs: None Weakness of Arms/Hands: None  Home Assistive Devices/Equipment Home Assistive Devices/Equipment: None  Therapy Consults (therapy consults require a physician order) PT Evaluation Needed: No OT Evalulation Needed: No SLP Evaluation Needed: No Abuse/Neglect Assessment (Assessment to be complete while patient is alone) Physical Abuse: Denies Verbal Abuse: Denies Sexual Abuse: Denies Exploitation of patient/patient's resources: Denies Self-Neglect: Denies Values / Beliefs Cultural Requests During Hospitalization: None Spiritual Requests During Hospitalization: None Consults Spiritual Care Consult Needed: No Transition of Care Team Consult Needed: No Advance Directives (For Healthcare) Does Patient Have a Medical Advance Directive?: No Would patient like information on creating a medical advance directive?: No - Patient declined          Disposition: Arlana Pouch NP is reccommended patient be discharged this date.  Disposition Initial Assessment Completed for this Encounter: Yes  On Site Evaluation by:   Reviewed with Physician:    Alfredia Ferguson 03/27/2020 12:12 PM

## 2020-03-27 NOTE — ED Notes (Signed)
Pt DC off unit to home per provider. Pt alert, calm, cooperative, no s/s of distress.DC informtation given to pt. Belongings given to pt.  Pt ambulatory off unit, escorted by RN. Pt transported by [facility].

## 2020-03-27 NOTE — Progress Notes (Signed)
TOC CSW  Spoke to pt, pt has signed Geneticist, molecular.  CSW contacted [facility].  [facility] has agreed to pick up pt.  CSW will continue to follow for dc needs.  Toshi Ishii Tarpley-Carter, MSW, LCSW-A Pronouns:  She, Her, Hers                  [facility] ED Transitions of CareClinical Social Worker Dearies [email or web address] [phone]

## 2020-03-27 NOTE — Consult Note (Signed)
[facility] Psych ED Discharge  03/27/2020 10:02 AM Justin [name]  MRN:  [id number] Principal Problem: Methamphetamine-induced mood disorder [facility]) Discharge Diagnoses: Principal Problem:   Methamphetamine-induced mood disorder (HCC)   Subjective: Patient states "I am a piece loving hippie but I had a wild weekend."  Patient assessed by nurse practitioner.  Patient alert and oriented, answers appropriately.  Patient pleasant cooperative during assessment.  Patient reports chronic use of substances including methamphetamine and other hallucinogens.  Patient reports "I know that I get paranoid when I use substances but when the intensity wears down I know that no one is out to get me."  Patient reports that his roommate who petition for involuntary commitment also uses substances, he believes that involuntary commitment was not warranted.  Patient denies suicidal and homicidal ideations.  Patient denies self-harm behaviors.  Patient denies auditory visual hallucinations.  Patient does not appear to be responding to internal stimuli and there is no evidence of delusional thought content.  Patient denies symptoms of paranoia.  Patient resides in [place] with a friend.  Patient denies access to weapons.  Patient is currently not employed but seeking employment.  Patient reports use of amphetamines and marijuana.  Patient states "I do not have a real problem, I am not doing drugs every day, I do not buy the drugs."  Patient offered support and encouragement.  Patient declines peers support substance use consult at this time.  Patient denies readiness to stop using substances at this time.  Patient declines any person to call for collateral today.   Total Time spent with patient: 30 minutes  Past Psychiatric History: Methamphetamine induced mood disorder, anxiety, substance abuse, acute psychosis  Past Medical History:  Past Medical History:  Diagnosis Date  . Endocarditis   . Hepatitis C   .  History of syphilis 08/25/2014   Treated by [facility] 07-2014.  [name] HIV (human immunodeficiency virus infection) (HCC)   . Seizures (HCC)     Past Surgical History:  Procedure Laterality Date  . hand surgery Right   . LUMBAR PUNCTURE  08/08/2019       Family History:  Family History  Adopted: Yes  Family history unknown: Yes   Family Psychiatric  History: None reported Social History:  Social History   Substance and Sexual Activity  Alcohol Use Yes   Comment: 1-2 a week      Social History   Substance and Sexual Activity  Drug Use Yes  . Frequency: 14.0 times per week  . Types: Marijuana, Methamphetamines    Social History   Socioeconomic History  . Marital status: Widowed    Spouse name: Not on file  . Number of children: Not on file  . Years of education: Not on file  . Highest education level: Not on file  Occupational History  . Not on file  Tobacco Use  . Smoking status: Current Every Day Smoker    Types: Cigarettes  . Smokeless tobacco: Never Used  Vaping Use  . Vaping Use: Never used  Substance and Sexual Activity  . Alcohol use: Yes    Comment: 1-2 a week   . Drug use: Yes    Frequency: 14.0 times per week    Types: Marijuana, Methamphetamines  . Sexual activity: Yes    Partners: Male    Birth control/protection: Condom    Comment: given condoms  Other Topics Concern  . Not on file  Social History Narrative   ** Merged History Encounter **       **  Merged History Encounter **       ** Merged History Encounter **       Social Determinants of Health   Financial Resource Strain:   . Difficulty of Paying Living Expenses: Not on file  Food Insecurity:   . Worried About Programme researcher, broadcasting/film/video in the Last Year: Not on file  . Ran Out of Food in the Last Year: Not on file  Transportation Needs:   . Lack of Transportation (Medical): Not on file  . Lack of Transportation (Non-Medical): Not on file  Physical Activity:   . Days of Exercise per Week: Not on  file  . Minutes of Exercise per Session: Not on file  Stress:   . Feeling of Stress : Not on file  Social Connections:   . Frequency of Communication with Friends and Family: Not on file  . Frequency of Social Gatherings with Friends and Family: Not on file  . Attends Religious Services: Not on file  . Active Member of Clubs or Organizations: Not on file  . Attends Banker Meetings: Not on file  . Marital Status: Not on file    Has this patient used any form of tobacco in the last 30 days? (Cigarettes, Smokeless Tobacco, Cigars, and/or Pipes) A prescription for an FDA-approved tobacco cessation medication was offered at discharge and the patient refused  Current Medications: Current Facility-Administered Medications  Medication Dose Route Frequency Provider Last Rate Last Admin  . ARIPiprazole (ABILIFY) tablet 5 mg  5 mg Oral Daily Linwood Dibbles, MD   5 mg at 03/27/20 0929  . bictegravir-emtricitabine-tenofovir AF (BIKTARVY) 50-200-25 MG per tablet 1 tablet  1 tablet Oral Daily Linwood Dibbles, MD   1 tablet at 03/27/20 0950  . levETIRAcetam (KEPPRA) tablet 500 mg  500 mg Oral BID Linwood Dibbles, MD   500 mg at 03/27/20 0930  . sertraline (ZOLOFT) tablet 50 mg  50 mg Oral Daily Linwood Dibbles, MD   50 mg at 03/27/20 8502  . ziprasidone (GEODON) injection 20 mg  20 mg Intramuscular PRN Sabino Donovan, MD       Current Outpatient Medications  Medication Sig Dispense Refill  . ARIPiprazole (ABILIFY) 5 MG tablet Take 1 tablet (5 mg total) by mouth daily. (Patient not taking: Reported on 02/13/2020) 30 tablet 0  . bictegravir-emtricitabine-tenofovir AF (BIKTARVY) 50-200-25 MG TABS tablet Take 1 tablet by mouth daily. (Patient not taking: Reported on 03/22/2020) 30 tablet 5  . levETIRAcetam (KEPPRA) 500 MG tablet Take 1 tablet (500 mg total) by mouth 2 (two) times daily. (Patient not taking: Reported on 03/22/2020) 60 tablet 5  . sertraline (ZOLOFT) 50 MG tablet Take 1 tablet (50 mg total) by mouth  daily. (Patient not taking: Reported on 02/13/2020) 30 tablet 0   PTA Medications: (Not in a hospital admission)   Musculoskeletal: Strength & Muscle Tone: within normal limits Gait & Station: normal Patient leans: N/A  Psychiatric Specialty Exam: Physical Exam Vitals and nursing note reviewed.  Constitutional:      Appearance: He is well-developed.  HENT:     Head: Normocephalic.  Cardiovascular:     Rate and Rhythm: Normal rate.  Pulmonary:     Effort: Pulmonary effort is normal.  Neurological:     Mental Status: He is alert and oriented to person, place, and time.  Psychiatric:        Attention and Perception: Attention and perception normal.        Mood and Affect: Mood and  affect normal.        Speech: Speech normal.        Behavior: Behavior normal. Behavior is cooperative.        Thought Content: Thought content normal.        Cognition and Memory: Cognition and memory normal.        Judgment: Judgment normal.     Review of Systems  Constitutional: Negative.   HENT: Negative.   Eyes: Negative.   Respiratory: Negative.   Cardiovascular: Negative.   Gastrointestinal: Negative.   Genitourinary: Negative.   Musculoskeletal: Negative.   Skin: Negative.   Neurological: Negative.   Psychiatric/Behavioral: Negative.     Blood pressure (!) 117/94, pulse 100, temperature 97.8 F (36.6 C), temperature source Oral, resp. rate 18, SpO2 99 %.There is no height or weight on file to calculate BMI.  General Appearance: Casual and Fairly Groomed  Eye Contact:  Good  Speech:  Clear and Coherent and Normal Rate  Volume:  Normal  Mood:  Euthymic  Affect:  Appropriate and Congruent  Thought Process:  Coherent, Goal Directed and Descriptions of Associations: Intact  Orientation:  Full (Time, Place, and Person)  Thought Content:  WDL and Logical  Suicidal Thoughts:  No  Homicidal Thoughts:  No  Memory:  Immediate;   Good Recent;   Good Remote;   Good  Judgement:  Fair   Insight:  Fair  Psychomotor Activity:  Normal  Concentration:  Concentration: Fair and Attention Span: Fair  Recall:  [employer] of Knowledge:  Fair  Language:  Fair  Akathisia:  No  Handed:  Right  AIMS (if indicated):     Assets:  Communication Skills Desire for Improvement Financial Resources/Insurance Housing Intimacy Leisure Time Physical Health Resilience Social Support  ADL's:  Intact  Cognition:  WNL  Sleep:        Demographic Factors:  Male and Unemployed  Loss Factors: NA  Historical Factors: NA  Risk Reduction Factors:   Living with another person, especially a relative, Positive social support, Positive therapeutic relationship and Positive coping skills or problem solving skills  Continued Clinical Symptoms:  Alcohol/Substance Abuse/Dependencies  Cognitive Features That Contribute To Risk:  None    Suicide Risk:  Minimal: No identifiable suicidal ideation.  Patients presenting with no risk factors but with morbid ruminations; may be classified as minimal risk based on the severity of the depressive symptoms    Plan Of Care/Follow-up recommendations:  Other:  Follow-up with outpatient psychiatry and substance use treatment resources.   Patient reviewed with Dr. Nelly Rout.  Recommend restart home medications including: -Abilify 5 mg daily -Zoloft 50 mg daily  Disposition: Patient cleared by psychiatry Patrcia Dolly, FNP 03/27/2020, 10:02 AM

## 2020-03-27 NOTE — ED Notes (Signed)
Justin [name] while displaying and angry affect and standing in a fight posture request to know how long before he would be seen by TTS. After verbally de-escalating Justin [name] Called the [facility] at [facility] who informed me that she is looking into how much longer wait time we have before he is evaluated.

## 2020-03-27 NOTE — Discharge Instructions (Signed)
For your behavioral health needs, you are advised to follow up with [facility].  They offer psychiatry/medication management, therapy and substance abuse treatment:         [facility]      [address]      [place], [place] [address]      [phone]      New patients are being seen in their walk-in clinic.  Walk-in hours are Monday - Thursday from 8:00 am - 11:00 am for psychiatry, and Friday from 1:00 pm - 4:00 pm for therapy.  Walk-in patients are seen on a first come, first served basis, so try to arrive as early as possible for the best chance of being seen the same day.

## 2020-03-27 NOTE — ED Notes (Addendum)
House coverage alerted to Justin Justin [name] aggressive behavior and security officers x4  Are on the unit at this time. Justin [name] observed counting staff members and locating exits and talking to himself under his breath in a attempt to prepare for elopement. Unable to reach TTS staff to find out where in line is Justin [name] [facility] contacted and asked to contact TTS for the information.

## 2020-03-27 NOTE — BH Assessment (Signed)
[facility] Assessment Progress Note  Per Berneice Heinrich, NP, this pt does not require psychiatric hospitalization at this time.  Pt presents under IVC initiated by pt's roommate and upheld by EDP Cherlynn Perches, MD, which has been rescinded by Nelly Rout, MD.  Pt is psychiatrically cleared.  Discharge instructions advise pt to follow up with [facility].  EDP Linwood Dibbles, MD and pt's nurse, Waynetta Sandy, have been notified.  Doylene Canning, MA Triage Specialist [phone]

## 2020-04-10 ENCOUNTER — Other Ambulatory Visit: Payer: Self-pay

## 2020-04-10 ENCOUNTER — Encounter (HOSPITAL_COMMUNITY): Payer: Self-pay

## 2020-04-10 ENCOUNTER — Emergency Department (HOSPITAL_COMMUNITY)
Admission: EM | Admit: 2020-04-10 | Discharge: 2020-04-12 | Disposition: A | Discharge status: Home / Self Care | Payer: Self-pay | Attending: Emergency Medicine | Admitting: Emergency Medicine

## 2020-04-10 DIAGNOSIS — F29 Unspecified psychosis not due to a substance or known physiological condition: Secondary | ICD-10-CM | POA: Insufficient documentation

## 2020-04-10 DIAGNOSIS — Z20822 Contact with and (suspected) exposure to covid-19: Secondary | ICD-10-CM | POA: Insufficient documentation

## 2020-04-10 DIAGNOSIS — F32A Depression, unspecified: Secondary | ICD-10-CM | POA: Insufficient documentation

## 2020-04-10 DIAGNOSIS — F1594 Other stimulant use, unspecified with stimulant-induced mood disorder: Secondary | ICD-10-CM | POA: Diagnosis present

## 2020-04-10 DIAGNOSIS — R569 Unspecified convulsions: Secondary | ICD-10-CM | POA: Insufficient documentation

## 2020-04-10 DIAGNOSIS — F23 Brief psychotic disorder: Secondary | ICD-10-CM | POA: Diagnosis present

## 2020-04-10 DIAGNOSIS — F17210 Nicotine dependence, cigarettes, uncomplicated: Secondary | ICD-10-CM | POA: Insufficient documentation

## 2020-04-10 DIAGNOSIS — R45851 Suicidal ideations: Secondary | ICD-10-CM | POA: Insufficient documentation

## 2020-04-10 LAB — CBC WITH DIFFERENTIAL/PLATELET
Abs Immature Granulocytes: 0.04 10*3/uL (ref 0.00–0.07)
Basophils Absolute: 0 10*3/uL (ref 0.0–0.1)
Basophils Relative: 0 %
Eosinophils Absolute: 0 10*3/uL (ref 0.0–0.5)
Eosinophils Relative: 0 %
HCT: 45.1 % (ref 39.0–52.0)
Hemoglobin: 15 g/dL (ref 13.0–17.0)
Immature Granulocytes: 0 %
Lymphocytes Relative: 20 %
Lymphs Abs: 2.1 10*3/uL (ref 0.7–4.0)
MCH: 32.1 pg (ref 26.0–34.0)
MCHC: 33.3 g/dL (ref 30.0–36.0)
MCV: 96.4 fL (ref 80.0–100.0)
Monocytes Absolute: 1.3 10*3/uL — ABNORMAL HIGH (ref 0.1–1.0)
Monocytes Relative: 13 %
Neutro Abs: 6.7 10*3/uL (ref 1.7–7.7)
Neutrophils Relative %: 67 %
Platelets: 243 10*3/uL (ref 150–400)
RBC: 4.68 MIL/uL (ref 4.22–5.81)
RDW: 12.3 % (ref 11.5–15.5)
WBC: 10.1 10*3/uL (ref 4.0–10.5)
nRBC: 0 % (ref 0.0–0.2)

## 2020-04-10 LAB — COMPREHENSIVE METABOLIC PANEL
ALT: 29 U/L (ref 0–44)
AST: 30 U/L (ref 15–41)
Albumin: 4.8 g/dL (ref 3.5–5.0)
Alkaline Phosphatase: 85 U/L (ref 38–126)
Anion gap: 12 (ref 5–15)
BUN: 16 mg/dL (ref 6–20)
CO2: 26 mmol/L (ref 22–32)
Calcium: 9.6 mg/dL (ref 8.9–10.3)
Chloride: 103 mmol/L (ref 98–111)
Creatinine, Ser: 1.18 mg/dL (ref 0.61–1.24)
GFR, Estimated: >60 mL/min (ref >60)
Glucose, Bld: 96 mg/dL (ref 70–99)
Potassium: 3.6 mmol/L (ref 3.5–5.1)
Sodium: 141 mmol/L (ref 135–145)
Total Bilirubin: 2.1 mg/dL — ABNORMAL HIGH (ref 0.3–1.2)
Total Protein: 8.1 g/dL (ref 6.5–8.1)

## 2020-04-10 LAB — ETHANOL: Alcohol, Ethyl (B): <10 mg/dL (ref <10)

## 2020-04-10 LAB — RESPIRATORY PANEL BY RT PCR (FLU A&B, COVID)
Influenza A by PCR: NEGATIVE
Influenza B by PCR: NEGATIVE
SARS Coronavirus 2 by RT PCR: NEGATIVE

## 2020-04-10 MED ORDER — BICTEGRAVIR-EMTRICITAB-TENOFOV 50-200-25 MG PO TABS
1.0000 | ORAL_TABLET | Freq: Every day | ORAL | Status: DC
  Administered 2020-04-11: 1 via ORAL
  Filled 2020-04-10 (×2): qty 1

## 2020-04-10 MED ORDER — LEVETIRACETAM 500 MG PO TABS
500.0000 mg | ORAL_TABLET | Freq: Once | ORAL | Status: AC
  Administered 2020-04-10: 500 mg via ORAL
  Filled 2020-04-10: qty 1

## 2020-04-10 MED ORDER — LEVETIRACETAM 500 MG PO TABS
500.0000 mg | ORAL_TABLET | Freq: Two times a day (BID) | ORAL | Status: DC
  Administered 2020-04-10 – 2020-04-11 (×3): 500 mg via ORAL
  Filled 2020-04-10 (×3): qty 1

## 2020-04-10 MED ORDER — ZIPRASIDONE MESYLATE 20 MG IM SOLR
10.0000 mg | Freq: Once | INTRAMUSCULAR | Status: AC
  Administered 2020-04-10: 10 mg via INTRAMUSCULAR
  Filled 2020-04-10: qty 20

## 2020-04-10 MED ORDER — ACETAMINOPHEN 325 MG PO TABS: 650.0000 mg | ORAL_TABLET | ORAL | Status: DC | PRN

## 2020-04-10 MED ORDER — FLUOXETINE HCL 20 MG PO CAPS
20.0000 mg | ORAL_CAPSULE | Freq: Every day | ORAL | Status: DC
  Administered 2020-04-11: 20 mg via ORAL
  Filled 2020-04-10: qty 1

## 2020-04-10 MED ORDER — ARIPIPRAZOLE 5 MG PO TABS
5.0000 mg | ORAL_TABLET | Freq: Every day | ORAL | Status: DC
  Administered 2020-04-11: 5 mg via ORAL
  Filled 2020-04-10: qty 1

## 2020-04-10 NOTE — ED Notes (Signed)
Patient told he needed to provide a urine sample, gave staff a sample of water in a cup. Wandering the halls, told he needed to return to his room, is now standing in the doorway. MD aware of behavior, states he cannot leave at this time D/T SI.

## 2020-04-10 NOTE — ED Provider Notes (Signed)
[facility]-EMERGENCY DEPT Provider Note   CSN: [id number] Arrival date & time: 04/10/20  1454     History Chief Complaint  Patient presents with  . Seizures  . Depression    Justin [name] is a 39 y.o. male.  Patient is a 39 year old male with a history of HIV, seizures, bipolar disorder and substance abuse who presents with seizures and depression.  He states he had a seizure yesterday.  He has not had any seizures today.  He had initially told triage that he was not taking the prescribed Keppra due to financial issues.  However after some considerable pondering on the matter, he says that he thinks that he is taking the Keppra.  It is unclear whether he is compliant or not.  He said he did not take it today.  He says he is taking his other medications.  He complains of hearing voices and depression.  He is having some thoughts of suicide but no plan.  He denies any recent illnesses other than he says he has been having some chest pain which may be from his seizure yesterday.        Past Medical History:  Diagnosis Date  . Endocarditis   . Hepatitis C   . History of syphilis 08/25/2014   Treated by [facility] 07-2014.  [name] HIV (human immunodeficiency virus infection) (HCC)   . Seizures [facility])     Patient Active Problem List   Diagnosis Date Noted  . Methamphetamine use disorder, severe, dependence (HCC) 03/23/2020  . 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 Acute psychosis (HCC)   . Substance use disorder 08/07/2019  . Encephalopathy 08/06/2019  . Chronic hepatitis C without hepatic coma (HCC) 08/17/2017  . Screening examination for venereal disease 05/03/2017  . Encounter for long-term (current) use of high-risk medication 05/03/2017  . Anxiety 09/26/2016  . Substance abuse (HCC) 09/26/2016  . Human immunodeficiency virus (HIV) disease (HCC) 09/02/2014  . History of syphilis 08/25/2014    Past Surgical History:  Procedure Laterality  Date  . hand surgery Right   . LUMBAR PUNCTURE  08/08/2019      . WRIST SURGERY         Family History  Adopted: Yes  Family history unknown: Yes    Social History   Tobacco Use  . Smoking status: Current Every Day Smoker    Types: Cigarettes  . Smokeless tobacco: Never Used  Vaping Use  . Vaping Use: Never used  Substance Use Topics  . Alcohol use: Yes    Comment: 1-2 a week   . Drug use: Yes    Frequency: 14.0 times per week    Types: Marijuana, Methamphetamines    Home Medications Prior to Admission medications   Medication Sig Start Date End Date Taking? Authorizing Provider  zonisamide (ZONEGRAN) 100 MG capsule Take 2 capsules by mouth every evening. 04/07/20 05/07/20 Yes [provider]  ARIPiprazole (ABILIFY) 5 MG tablet Take 1 tablet (5 mg total) by mouth daily. Patient not taking: Reported on 02/13/2020 12/06/19   [name], MD  bictegravir-emtricitabine-tenofovir AF (BIKTARVY) 50-200-25 MG TABS tablet Take 1 tablet by mouth daily. Patient not taking: Reported on 03/22/2020 02/13/20   [name], MD  FLUoxetine (PROZAC) 20 MG capsule Take 20 mg by mouth daily. 04/07/20   [provider]  levETIRAcetam (KEPPRA) 500 MG tablet Take 1 tablet (500 mg total) by mouth 2 (two) times daily. Patient not taking: Reported on 03/22/2020 02/13/20  [name], MD  sertraline (ZOLOFT) 50 MG tablet Take 1 tablet (50 mg total) by mouth daily. Patient not taking: Reported on 02/13/2020 12/06/19   [name], MD    Allergies    Patient has no known allergies.  Review of Systems   Review of Systems  Constitutional: Negative for chills, diaphoresis, fatigue and fever.  HENT: Negative for congestion, rhinorrhea and sneezing.   Eyes: Negative.   Respiratory: Negative for cough, chest tightness and shortness of breath.   Cardiovascular: Positive for chest pain. Negative for leg swelling.  Gastrointestinal: Negative for abdominal pain, blood in  stool, diarrhea, nausea and vomiting.  Genitourinary: Negative for difficulty urinating, flank pain, frequency and hematuria.  Musculoskeletal: Negative for arthralgias and back pain.  Skin: Negative for rash.  Neurological: Positive for seizures. Negative for dizziness, speech difficulty, weakness, numbness and headaches.  Psychiatric/Behavioral: Positive for dysphoric mood, hallucinations and suicidal ideas.    Physical Exam Updated Vital Signs BP 128/83   Pulse 99   Temp 98.1 F (36.7 C) (Oral)   Resp 18   Ht 5\' 11"  (1.803 m)   Wt 91.6 kg   SpO2 94%   BMI 28.17 kg/m   Physical Exam Constitutional:      Appearance: He is well-developed.  HENT:     Head: Normocephalic and atraumatic.  Eyes:     Pupils: Pupils are equal, round, and reactive to light.  Cardiovascular:     Rate and Rhythm: Normal rate and regular rhythm.     Heart sounds: Normal heart sounds.  Pulmonary:     Effort: Pulmonary effort is normal. No respiratory distress.     Breath sounds: Normal breath sounds. No wheezing or rales.  Chest:     Chest wall: No tenderness.  Abdominal:     General: Bowel sounds are normal.     Palpations: Abdomen is soft.     Tenderness: There is no abdominal tenderness. There is no guarding or rebound.  Musculoskeletal:        General: Normal range of motion.     Cervical back: Normal range of motion and neck supple.  Lymphadenopathy:     Cervical: No cervical adenopathy.  Skin:    General: Skin is warm and dry.     Findings: No rash.  Neurological:     Mental Status: He is alert and oriented to person, place, and time.     GCS: GCS eye subscore is 4. GCS verbal subscore is 5. GCS motor subscore is 6.     Sensory: Sensation is intact.     Motor: Motor function is intact.  Psychiatric:        Speech: Speech is delayed.        Behavior: Behavior is withdrawn.        Thought Content: Thought content includes suicidal ideation.     ED Results / Procedures / Treatments    Labs (all labs ordered are listed, but only abnormal results are displayed) Labs Reviewed  COMPREHENSIVE METABOLIC PANEL - Abnormal; Notable for the following components:      Result Value   Total Bilirubin 2.1 (*)    All other components within normal limits  CBC WITH DIFFERENTIAL/PLATELET - Abnormal; Notable for the following components:   Monocytes Absolute 1.3 (*)    All other components within normal limits  RESPIRATORY PANEL BY RT PCR (FLU A&B, COVID)  ETHANOL  RAPID URINE DRUG SCREEN, HOSP PERFORMED    EKG EKG Interpretation  Date/Time:  Friday April 10 2020 15:50:00 EDT Ventricular Rate:  88 PR Interval:    QRS Duration: 111 QT Interval:  374 QTC Calculation: 453 R Axis:   -10 Text Interpretation: Sinus arrhythmia Consider right atrial enlargement since last tracing no significant change Confirmed by Rolan Bucco [phone]) on 04/10/2020 4:06:59 PM   Radiology No results found.  Procedures Procedures (including critical care time)  Medications Ordered in ED Medications  acetaminophen (TYLENOL) tablet 650 mg (has no administration in time range)  ARIPiprazole (ABILIFY) tablet 5 mg (has no administration in time range)  bictegravir-emtricitabine-tenofovir AF (BIKTARVY) 50-200-25 MG per tablet 1 tablet (has no administration in time range)  FLUoxetine (PROZAC) capsule 20 mg (has no administration in time range)  levETIRAcetam (KEPPRA) tablet 500 mg (has no administration in time range)  levETIRAcetam (KEPPRA) tablet 500 mg (500 mg Oral Given 04/10/20 1720)  ziprasidone (GEODON) injection 10 mg (10 mg Intramuscular Given 04/10/20 1948)    ED Course  I have reviewed the triage vital signs and the nursing notes.  Pertinent labs & imaging results that were available during my care of the patient were reviewed by me and considered in my medical decision making (see chart for details).    MDM Rules/Calculators/A&P                          Patient is a 39 year old  male who presents with suicidal ideations and psychotic behavior.  He reported a seizure but he has not had any seizure activity today.  It is unclear whether he is compliant with his medicine.  He was given a dose of Keppra and started back on his regular medications.  His labs are nonconcerning.  He did get very agitated at 1 point in the ED and was wandering around the house.  He was given Geodon and IVC papers were initiated.  He currently is medically cleared and awaiting TTS evaluation. Final Clinical Impression(s) / ED Diagnoses Final diagnoses:  Psychosis, unspecified psychosis type (HCC)  Suicidal ideation    Rx / DC Orders ED Discharge Orders    None       Rolan Bucco, MD 04/10/20 2319

## 2020-04-10 NOTE — ED Triage Notes (Signed)
Pt came from home via EMS.  C/c Seizure activity in the past month. Not able to take prescribed Keppra d/t financial access issues. EMS reported that pt was confused at home, but he has been A&O x 4 since being in the truck and here at the ED. Pt did not want any IV or invasive procedure done w/ EMS, but he has given me verbal consent for tx today.  Pt reports hearing voices and seeing people that are not there. The voices are telling him to harm himself. He reports SI. He reports that he does not take meds for bipolar d/o. He is HIV positive and takes bictarvi for that.

## 2020-04-11 DIAGNOSIS — F23 Brief psychotic disorder: Secondary | ICD-10-CM

## 2020-04-11 LAB — RAPID URINE DRUG SCREEN, HOSP PERFORMED
Amphetamines: POSITIVE — AB
Barbiturates: NOT DETECTED
Benzodiazepines: NOT DETECTED
Cocaine: NOT DETECTED
Opiates: NOT DETECTED
Tetrahydrocannabinol: NOT DETECTED

## 2020-04-11 MED ORDER — ASENAPINE MALEATE 5 MG SL SUBL
10.0000 mg | SUBLINGUAL_TABLET | Freq: Every day | SUBLINGUAL | Status: DC | PRN
  Filled 2020-04-11: qty 2

## 2020-04-11 MED ORDER — GABAPENTIN 300 MG PO CAPS
300.0000 mg | ORAL_CAPSULE | Freq: Three times a day (TID) | ORAL | Status: DC
  Administered 2020-04-11 (×2): 300 mg via ORAL
  Filled 2020-04-11 (×2): qty 1

## 2020-04-11 MED ORDER — ZIPRASIDONE MESYLATE 20 MG IM SOLR
20.0000 mg | Freq: Once | INTRAMUSCULAR | Status: DC | PRN
Start: 2020-04-11 — End: 2020-04-11

## 2020-04-11 MED ORDER — RISPERIDONE 1 MG PO TABS
1.0000 mg | ORAL_TABLET | Freq: Two times a day (BID) | ORAL | Status: DC
  Administered 2020-04-11 (×2): 1 mg via ORAL
  Filled 2020-04-11 (×2): qty 1

## 2020-04-11 MED ORDER — ZIPRASIDONE MESYLATE 20 MG IM SOLR
10.0000 mg | Freq: Once | INTRAMUSCULAR | Status: AC | PRN
  Administered 2020-04-11: 10 mg via INTRAMUSCULAR
  Filled 2020-04-11: qty 20

## 2020-04-11 MED ORDER — GABAPENTIN 100 MG PO CAPS: 200.0000 mg | ORAL_CAPSULE | Freq: Two times a day (BID) | ORAL | Status: DC

## 2020-04-11 NOTE — ED Notes (Signed)
Pt continues to be in staff space, uncooperative. Pt acting like hearing voices.  Pt continues to try to leave, security has to talk to him and redirect, uncooperative,
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
## 2020-04-11 NOTE — Progress Notes (Addendum)
Chart reviewed and case discussed with TTS. Patient is well known to behavioral health and area emergency department. Patient was discharged from [facility] on 04/07/2020 after a 6 day stay due to stimulant induced psychosis. Patient was agitated in the ED earlier, Geodon was administered, and patient was placed under IVC. Home medications were ordered by EDP. Recommend overnight observation with reevaluation in the morning. Patient is not appropriate for [facility] at this time.

## 2020-04-11 NOTE — ED Notes (Signed)
Patient agitated and unable to console. Pt verbally abusive and very paranoid. Security and [employer] called. Geodon IM Given. Will continue to monitor.

## 2020-04-11 NOTE — ED Notes (Signed)
Pt alert, standing at door.  No s/s of distress
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
## 2020-04-11 NOTE — ED Notes (Signed)
Pt resting comfortably

## 2020-04-12 MED ORDER — DIPHENHYDRAMINE HCL 50 MG/ML IJ SOLN
50.0000 mg | Freq: Once | INTRAMUSCULAR | Status: AC
  Administered 2020-04-12: 50 mg via INTRAMUSCULAR
  Filled 2020-04-12: qty 1

## 2020-04-12 MED ORDER — HALOPERIDOL LACTATE 5 MG/ML IJ SOLN
10.0000 mg | Freq: Once | INTRAMUSCULAR | Status: AC
  Administered 2020-04-12: 10 mg via INTRAMUSCULAR
  Filled 2020-04-12: qty 2

## 2020-04-12 MED ORDER — LORAZEPAM 2 MG/ML IJ SOLN
2.0000 mg | Freq: Once | INTRAMUSCULAR | Status: AC
  Administered 2020-04-12: 2 mg via INTRAMUSCULAR
  Filled 2020-04-12: qty 1

## 2020-04-12 NOTE — ED Notes (Signed)
Patient is calm and sleeping,  Restraints removed and patient woke up.  Pt was told that we could release them if he could behave and stay in his room.  Warm blanket was given.

## 2020-04-12 NOTE — Discharge Instructions (Addendum)
Please follow up with one of following outpatient mental health agencies:   [facility]  (Assessment, Medication management and counseling) [address], [place], [place] [address] Phone: [phone] *Some walk-in assessment times available- call number above   [facility]  (Medication management and counseling) Address: [address], [place], [place] [address] Phone: [phone] *Accepts walk-in's M-F starting at 8am  [facility]  (Counseling specializing in trauma and co-occurring disorders) [email or web address] [address], [address] [place], [place], [address] Phone:[phone] Email: [email or web address]  [facility]  (Wellness classes, peer support) [address]  [place], [place] [address] Phone: [phone]  [facility]  (individual and group counseling)  [address] [place], [place] [address] [phone]  Suicide Prevention information listed below:  [facility] Behavioral Health Crisis Line: [phone]) [phone]  Mobile Crisis [facility]      Mobile Crisis Care Unit     [phone]

## 2020-04-12 NOTE — ED Notes (Signed)
Patient very agitated this morning. Shouting yelling and Verbally Abusive towards staff. [employer] and security called. Verbal escalation failed. Benadryl, Ativan and haldol IM given. Violent restraints applied. Will continue to monitor.

## 2020-04-12 NOTE — ED Notes (Signed)
I have attempted to awaken patient multiple times and he cannot stay awake.  Will continue to try as to discharge him home.

## 2020-04-12 NOTE — BHH Suicide Risk Assessment (Signed)
Suicide Risk Assessment    Discharge Assessment   [facility]   Principal Problem: Acute psychosis [facility]) Discharge Diagnoses: Principal Problem:   Acute psychosis (HCC) Active Problems:   Methamphetamine-induced mood disorder (HCC)  Subjective:"I am ready to go home.  I feel better today."  April 12, 2020: Patient seen via telepsych by this provider; chart reviewed and consulted with Dr. Jannifer Franklin on 04/12/20.  On evaluation Justin [name] reports the patient is laying in bed resting with this eyes closed.  He agrees to speak with this Clinical research associate and sits up in bed, and faces the telepsych monitor to begin the interview.  He tells me he is tired and would like to go home.  He required prn Geodon and soft restraints for agitaiton earlier this morning.  When asked about this he states, "I just don't understand why I am still here.  I thought I was going home yesterday."  During his hospital course, he was given risperdal to help with psychosis and gabapentin to help with substance abuse withdrawals.  Although there is intermittent agitation, this seems to be more related to his inability to go home and not altered mental status or drug induced psychosis.   Since admission, He symptomatically improved and is now calm and cooperative.; more clear and coherent today than he was on yesterday.  He makes good eye contact when communicating with this Clinical research associate.  He denies suicidal or homicidal ideations.  He does not appear to be delusional and no paranoia seen.  He denies audible or visual hallucinations and no longer seen responding to internal stimulus.  Denies side effects from medications.   Total Time spent with patient: 30 minutes  Musculoskeletal: Strength & Muscle Tone: did not assess via telepsych Gait & Station: normal, did not assess via telepsych Patient leans: N/A  Psychiatric Specialty Exam: @ROSBYAGE @  Blood pressure 122/77, pulse (!) 105, temperature (!) 97.5  F (36.4 C), temperature source Oral, resp. rate 18, height 5\' 11"  (1.803 m), weight 91.6 kg, SpO2 95 %.Body mass index is 28.17 kg/m.  General Appearance: Casual and Fairly Groomed  Eye Contact::  Good  Speech:  Clear and Coherent and Normal Rate409  Volume:  Normal  Mood:  Euthymic  Affect:  Congruent  Thought Process:  Coherent, Goal Directed and Descriptions of Associations: Intact  Orientation:  Full (Time, Place, and Person)  Thought Content:  Logical  Suicidal Thoughts:  No  Homicidal Thoughts:  No  Memory:  Immediate;   Good Recent;   Good Remote;   Good  Judgement:  Intact  Insight:  Fair, in the setting of continued drug abuse  Psychomotor Activity:  Normal  Concentration:  Good  Recall:  Good  Fund of Knowledge:Good  Language: Good  Akathisia:  NA  Handed:  Right  AIMS (if indicated):     Assets:  Communication Skills Housing Social Support  Sleep:   fair  Cognition: WNL  ADL's:  Intact   Mental Status Per Nursing Assessment::   On Admission:     Demographic Factors:  Male and Low socioeconomic status  Loss Factors: NA  Historical Factors: Family history of mental illness or substance abuse and Impulsivity  Risk Reduction Factors:   Living with another person, especially a relative  Continued Clinical Symptoms:  Alcohol/Substance Abuse/Dependencies  Cognitive Features That Contribute To Risk:  None    Suicide Risk:  Mild:  Suicidal ideation of limited frequency, intensity, duration, and specificity.  There are no identifiable  plans, no associated intent, mild dysphoria and related symptoms, good self-control (both objective and subjective assessment), few other risk factors, and identifiable protective factors, including available and accessible social support.   Plan Of Care/Follow-up recommendations:   Plan- As per above assessment, the patient is psych cleared and there are no current grounds for involuntary commitment at this time.?  Spoke  with Dr. Aileen Pilot who agrees to rescind the IVC at this time.    Patient is not currently interested in inpatient services, he is reluctant to receive outpatient resources but agrees to accept resources for outpatient mental health and substance abuse services. He would benefit from SW assistance with this.    ?  Spoke with Dr. Estell Harpin; informed of above recommendation and disposition.  Chales Abrahams, NP 04/12/2020, 10:51 AM

## 2020-04-18 ENCOUNTER — Other Ambulatory Visit: Payer: Self-pay

## 2020-04-18 ENCOUNTER — Ambulatory Visit (HOSPITAL_COMMUNITY)
Admission: EM | Admit: 2020-04-18 | Discharge: 2020-04-19 | Disposition: A | Discharge status: Home / Self Care | Payer: No Payment, Other | Attending: Psychiatry | Admitting: Psychiatry

## 2020-04-18 DIAGNOSIS — Z21 Asymptomatic human immunodeficiency virus [HIV] infection status: Secondary | ICD-10-CM | POA: Diagnosis not present

## 2020-04-18 DIAGNOSIS — Z20822 Contact with and (suspected) exposure to covid-19: Secondary | ICD-10-CM | POA: Diagnosis not present

## 2020-04-18 DIAGNOSIS — R569 Unspecified convulsions: Secondary | ICD-10-CM | POA: Insufficient documentation

## 2020-04-18 DIAGNOSIS — Z8619 Personal history of other infectious and parasitic diseases: Secondary | ICD-10-CM | POA: Insufficient documentation

## 2020-04-18 DIAGNOSIS — Z79899 Other long term (current) drug therapy: Secondary | ICD-10-CM | POA: Insufficient documentation

## 2020-04-18 DIAGNOSIS — F15950 Other stimulant use, unspecified with stimulant-induced psychotic disorder with delusions: Secondary | ICD-10-CM | POA: Insufficient documentation

## 2020-04-18 DIAGNOSIS — F333 Major depressive disorder, recurrent, severe with psychotic symptoms: Secondary | ICD-10-CM | POA: Insufficient documentation

## 2020-04-18 DIAGNOSIS — F1994 Other psychoactive substance use, unspecified with psychoactive substance-induced mood disorder: Secondary | ICD-10-CM | POA: Insufficient documentation

## 2020-04-18 DIAGNOSIS — F1594 Other stimulant use, unspecified with stimulant-induced mood disorder: Secondary | ICD-10-CM

## 2020-04-18 DIAGNOSIS — F17200 Nicotine dependence, unspecified, uncomplicated: Secondary | ICD-10-CM | POA: Diagnosis not present

## 2020-04-18 LAB — CBC WITH DIFFERENTIAL/PLATELET
Abs Immature Granulocytes: 0.02 10*3/uL (ref 0.00–0.07)
Basophils Absolute: 0 10*3/uL (ref 0.0–0.1)
Basophils Relative: 0 %
Eosinophils Absolute: 0 10*3/uL (ref 0.0–0.5)
Eosinophils Relative: 0 %
HCT: 47.8 % (ref 39.0–52.0)
Hemoglobin: 15.7 g/dL (ref 13.0–17.0)
Immature Granulocytes: 0 %
Lymphocytes Relative: 29 %
Lymphs Abs: 2.1 10*3/uL (ref 0.7–4.0)
MCH: 31.6 pg (ref 26.0–34.0)
MCHC: 32.8 g/dL (ref 30.0–36.0)
MCV: 96.2 fL (ref 80.0–100.0)
Monocytes Absolute: 0.7 10*3/uL (ref 0.1–1.0)
Monocytes Relative: 9 %
Neutro Abs: 4.5 10*3/uL (ref 1.7–7.7)
Neutrophils Relative %: 62 %
Platelets: 296 10*3/uL (ref 150–400)
RBC: 4.97 MIL/uL (ref 4.22–5.81)
RDW: 11.9 % (ref 11.5–15.5)
WBC: 7.4 10*3/uL (ref 4.0–10.5)
nRBC: 0 % (ref 0.0–0.2)

## 2020-04-18 LAB — COMPREHENSIVE METABOLIC PANEL
ALT: 48 U/L — ABNORMAL HIGH (ref 0–44)
AST: 42 U/L — ABNORMAL HIGH (ref 15–41)
Albumin: 4.5 g/dL (ref 3.5–5.0)
Alkaline Phosphatase: 99 U/L (ref 38–126)
Anion gap: 13 (ref 5–15)
BUN: 12 mg/dL (ref 6–20)
CO2: 25 mmol/L (ref 22–32)
Calcium: 9.7 mg/dL (ref 8.9–10.3)
Chloride: 99 mmol/L (ref 98–111)
Creatinine, Ser: 1.18 mg/dL (ref 0.61–1.24)
GFR, Estimated: >60 mL/min (ref >60)
Glucose, Bld: 103 mg/dL — ABNORMAL HIGH (ref 70–99)
Potassium: 3.3 mmol/L — ABNORMAL LOW (ref 3.5–5.1)
Sodium: 137 mmol/L (ref 135–145)
Total Bilirubin: 2.6 mg/dL — ABNORMAL HIGH (ref 0.3–1.2)
Total Protein: 8 g/dL (ref 6.5–8.1)

## 2020-04-18 LAB — POC SARS CORONAVIRUS 2 AG: SARS Coronavirus 2 Ag: NEGATIVE

## 2020-04-18 LAB — RESPIRATORY PANEL BY RT PCR (FLU A&B, COVID)
Influenza A by PCR: NEGATIVE
Influenza B by PCR: NEGATIVE
SARS Coronavirus 2 by RT PCR: NEGATIVE

## 2020-04-18 LAB — ETHANOL: Alcohol, Ethyl (B): <10 mg/dL (ref <10)

## 2020-04-18 LAB — LIPID PANEL
Cholesterol: 164 mg/dL (ref 0–200)
HDL: 58 mg/dL (ref >40)
LDL Cholesterol: 98 mg/dL (ref 0–99)
Total CHOL/HDL Ratio: 2.8 RATIO
Triglycerides: 39 mg/dL (ref <150)
VLDL: 8 mg/dL (ref 0–40)

## 2020-04-18 LAB — POCT URINE DRUG SCREEN - MANUAL ENTRY (I-SCREEN)
POC Amphetamine UR: POSITIVE — AB
POC Buprenorphine (BUP): NOT DETECTED
POC Cocaine UR: NOT DETECTED
POC Marijuana UR: NOT DETECTED
POC Methadone UR: NOT DETECTED
POC Methamphetamine UR: POSITIVE — AB
POC Morphine: NOT DETECTED
POC Oxazepam (BZO): NOT DETECTED
POC Oxycodone UR: NOT DETECTED
POC Secobarbital (BAR): NOT DETECTED

## 2020-04-18 MED ORDER — BICTEGRAVIR-EMTRICITAB-TENOFOV 50-200-25 MG PO TABS
1.0000 | ORAL_TABLET | Freq: Every day | ORAL | Status: DC
  Administered 2020-04-18 – 2020-04-19 (×2): 1 via ORAL
  Filled 2020-04-18: qty 7
  Filled 2020-04-18 (×2): qty 1

## 2020-04-18 MED ORDER — HALOPERIDOL 5 MG PO TABS
5.0000 mg | ORAL_TABLET | Freq: Once | ORAL | Status: AC
  Administered 2020-04-18: 5 mg via ORAL
  Filled 2020-04-18: qty 1

## 2020-04-18 MED ORDER — HALOPERIDOL LACTATE 5 MG/ML IJ SOLN
10.0000 mg | Freq: Once | INTRAMUSCULAR | Status: AC
  Filled 2020-04-18: qty 2

## 2020-04-18 MED ORDER — FLUOXETINE HCL 20 MG PO CAPS
20.0000 mg | ORAL_CAPSULE | Freq: Every day | ORAL | Status: DC
  Administered 2020-04-18 – 2020-04-19 (×2): 20 mg via ORAL
  Filled 2020-04-18: qty 1
  Filled 2020-04-18: qty 7
  Filled 2020-04-18: qty 1

## 2020-04-18 MED ORDER — ALUM & MAG HYDROXIDE-SIMETH 200-200-20 MG/5ML PO SUSP: 30.0000 mL | ORAL | Status: DC | PRN

## 2020-04-18 MED ORDER — LORAZEPAM 1 MG PO TABS
2.0000 mg | ORAL_TABLET | Freq: Once | ORAL | Status: AC
  Administered 2020-04-18: 2 mg via ORAL
  Filled 2020-04-18: qty 2

## 2020-04-18 MED ORDER — MAGNESIUM HYDROXIDE 400 MG/5ML PO SUSP: 30.0000 mL | Freq: Every day | ORAL | Status: DC | PRN

## 2020-04-18 MED ORDER — ARIPIPRAZOLE 5 MG PO TABS
5.0000 mg | ORAL_TABLET | Freq: Every day | ORAL | Status: DC
  Administered 2020-04-18 – 2020-04-19 (×2): 5 mg via ORAL
  Filled 2020-04-18: qty 7
  Filled 2020-04-18 (×2): qty 1

## 2020-04-18 MED ORDER — LEVETIRACETAM 500 MG PO TABS
500.0000 mg | ORAL_TABLET | Freq: Two times a day (BID) | ORAL | Status: DC
  Administered 2020-04-18 – 2020-04-19 (×3): 500 mg via ORAL
  Filled 2020-04-18 (×2): qty 1
  Filled 2020-04-18: qty 14
  Filled 2020-04-18: qty 1

## 2020-04-18 MED ORDER — ACETAMINOPHEN 325 MG PO TABS: 650.0000 mg | ORAL_TABLET | Freq: Four times a day (QID) | ORAL | Status: DC | PRN

## 2020-04-18 NOTE — BH Assessment (Signed)
Comprehensive Clinical Assessment (CCA) Note  04/18/2020 Justin [name] [id number]   Patient presented to the [facility] voluntarily.  He is well known to [employer] having been seen in the ED on a frequent basis.  Normally, patient presents while intoxicated on methamphetamine and highly delusional to the point he is difficult to re-direct.  Patient presents to the [facility] today with some paranoid and delusional thinking, but also admits to using some methamphetamine earlier today.  Patient is calm and cooperative.  He states that he feels like there is a hit out on him because he shared needles and he is HIV positive and he states that he had not told them.  He also states that people on social media are telling everyone that he is involved with child pornography which is not true.  Patient states that he can hear people talking about him and he thinks that the conversations are coming from friends and family.  as he is going through the assessment process.  However, these delusions and hallucinations are most likely attributed to his methamphetamine use.  Patient was recently at [facility] and was stabilized on medications and he was discharged from the hospital on 04/10/2020.  Unfortunately, he had to return to the same living situation that he was in prior to his hospitalization and he subsequently relapsed.  He has been non-compliant with his medications.  Patient denies SI/HI.  He states that he has been abusing alcohol and methamphetamine a few times a week.  Patient states that he is ready to get some help for himself, but states that he needs to break his cycle of coming to the hospital, being discharged back to the same living environment.  He states that he needs to be sent to a longer term program in another state, but for today, he is agreeable to go to a shorter term program to get stabilized.  Because his paranoid delusions are drug induced, he will be held overnight at the [facility].   However, if he is discharged back to the street, he is very likely to relapse.  TTS has contacted [facility] in [place] and they are willing to review him for possible placement in their facility tomorrow.  Justin [name], Money, NP, has worked with the [employer] and can get a seven day supply of his medications if he is accepted at the [facility].  Patient wants to go to a longer term program, but he has a court date on 04/30/20 that will either have to be continued or disposed of in order for a longer term program to accept him.  Patient is oriented and alert and accepts that his delusions are not real when re-directed.  He has been very cooperative in the assessment process.  Patient has characteristically poor judgment, limited insight and poor impulse control due to his drug use.  His thoughts are organized with the exception of his delusional thinking.  Visit Diagnosis:      ICD-10-CM   1. 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    Methamphetamine Use Disorder Severe         F15.20   CCA Screening, Triage and Referral (STR)  Patient Reported Information How did you hear about [place]? Self  Referral name: Patient presents via [employer] under IVC.  Referral phone number: No data recorded  Whom do you see  for routine medical problems? Other (Comment) Justin [name], [facility])  Practice/Facility Name: [facility]  Practice/Facility Phone Number: No data recorded Name of Contact: No data recorded Contact Number: [phone]  Contact Fax Number: [phone]  Prescriber Name: [facility].  Prescriber Address (if known): No data recorded  What Is the Reason for Your Visit/Call Today? Patient states that he is paranoid, admits to a small amount of methamphetamine use today.  Patient states that he feels like there is a hit out on him because he has HIV and shared needles and someone put  it on social media.  How Long Has This Been Causing You Problems? > than 6 months  What Do You Feel Would Help You the Most Today? Other (Comment) (patient would like to go to a dual diagnosis treatment program outside of [place])   Have You Recently Been in Any Inpatient Treatment (Hospital/Detox/Crisis Center/28-Day Program)? Yes  Name/Location of Program/Hospital:Pt was at [facility] recently, discharged on 10/06 after a week long stay.  How Long Were You There? One week  When Were You Discharged? 04/08/20   Have You Ever Received Services From [employer] Before? Yes  Who Do You See at [facility]? [facility] and ED visits   Have You Recently Had Any Thoughts About Hurting Yourself? No  Are You Planning to Commit Suicide/Harm Yourself At This time? No   Have you Recently Had Thoughts About Hurting Someone Justin [name]? No  Explanation: No data recorded  Have You Used Any Alcohol or Drugs in the Past 24 Hours? Yes  How Long Ago Did You Use Drugs or Alcohol? No data recorded What Did You Use and How Much? Meth - amt unknown "It wasn't that much"   Do You Currently Have a Therapist/Psychiatrist? No  Name of Therapist/Psychiatrist: No data recorded  Have You Been Recently Discharged From Any Office Practice or Programs? No  Explanation of Discharge From Practice/Program: No data recorded    CCA Screening Triage Referral Assessment Type of Contact: Face-to-Face  Is this Initial or Reassessment? Initial Assessment  Date Telepsych consult ordered in [facility]:  03/27/20  Time Telepsych consult ordered in [facility]:  1317   Patient Reported Information Reviewed? Yes  Patient Left Without Being Seen? No data recorded Reason for Not Completing Assessment: No data recorded  Collateral Involvement: No collateral information available   Does Patient Have a Court Appointed Legal Guardian? No data recorded Name and Contact of Legal Guardian: Self  If Minor and Not Living with Parent(s), Who  has Custody? Self  Is CPS involved or ever been involved? Never  Is APS involved or ever been involved? Never   Patient Determined To Be At Risk for Harm To Self or Others Based on Review of Patient Reported Information or Presenting Complaint? No  Method: Plan without intent (Get hit by a car or suicide by cop.)  Availability of Means: Has close by [employer] and police)  Intent: Vague intent or NA  Notification Required: No need or identified person  Additional Information for Danger to Others Potential: No data recorded Additional Comments for Danger to Others Potential: No data recorded Are There Guns or Other Weapons in Your Home? No  Types of Guns/Weapons: No data recorded Are These Weapons Safely Secured?                            No data recorded Who Could Verify You Are Able To Have These Secured:  No data recorded Do You Have any Outstanding Charges, Pending Court Dates, Parole/Probation? Trespassing/DWI/Assault on a Governmental Official 04/30/2020  Contacted To Inform of Risk of Harm To Self or Others: Family/Significant Other:   [facility] of Assessment[facility]   Does Patient Present under Involuntary Commitment? No  IVC Papers Initial File Date: 03/22/20   [place]   Patient Currently Receiving the Following Services: Medication Management   Determination of Need: Emergent (2 hours)   Options For Referral: Other: Comment;Inpatient Hospitalization (continuous observation)     CCA Biopsychosocial  Intake/Chief Complaint:  CCA Intake With Chief Complaint CCA Part Two Date: 04/18/20 CCA Part Two Time: 1245 Chief Complaint/Presenting Problem: [facility] some paranoid and delusional thinking, but also admits to using some methamphetamine earlier today.  Patient is calm and cooperative.  He states that he feels like there is a hit out on him because he shared needles and he is HIV positive and  he states that he had not told them.  He also states that people on social media are telling everyone that he is involved with child pornography which is not true.  Patient states that he can hear people talking about him as he is going through the assessment process.  However, these delusions and hallucinations are most likely attributed to his methamphetamine use. 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, forensicmusician Individual's Preferences: Patient would like to go to a program outside of [place] Individual's Abilities: Patient states that is good at boxing and music Type of Services Patient Feels Are Needed: Patient would like to get into a longer term program  Mental Health Symptoms Depression:  Depression: Difficulty Concentrating, Increase/decrease in appetite, Sleep (too much or little), Duration of symptoms greater than two weeks  Mania:  Mania: None  Anxiety:   Anxiety: Worrying, Difficulty concentrating  Psychosis:  Psychosis: Delusions, Hallucinations, Duration of symptoms less than six months  Trauma:  Trauma: None  Obsessions:  Obsessions: None  Compulsions:  Compulsions: None  Inattention:  Inattention: N/A  Hyperactivity/Impulsivity:  Hyperactivity/Impulsivity: N/A  Oppositional/Defiant Behaviors:  Oppositional/Defiant Behaviors: None  Emotional Irregularity:  Emotional Irregularity: Intense/unstable relationships, Potentially harmful impulsivity  Other Mood/Personality Symptoms:      Mental Status Exam Appearance and self-care  Stature:  Stature: Small  Weight:  Weight: Thin  Clothing:  Clothing: Dirty, Disheveled  Grooming:  Grooming: Neglected  Cosmetic use:  Cosmetic Use: Age appropriate  Posture/gait:  Posture/Gait: Normal  Motor activity:  Motor Activity: Not Remarkable  Sensorium  Attention:  Attention: Normal  Concentration:  Concentration: Preoccupied (When  [facility], he thinks people are talking about him and that it is his friends and family talking)  Orientation:  Orientation: Object, Person, Place, Situation, Time  Recall/memory:  Recall/Memory: Normal  Affect and Mood  Affect:  Affect: Depressed, Flat  Mood:  Mood: Depressed  Relating  Eye contact:  Eye Contact: Normal  Facial expression:  Facial Expression: Depressed  Attitude toward examiner:  Attitude Toward Examiner: Cooperative  Thought and Language  Speech flow: Speech Flow: Clear and Coherent  Thought content:  Thought Content: Appropriate to Mood and Circumstances  Preoccupation:  Preoccupations: Other (Comment) (thinks people are talikng about him)  Hallucinations:  Hallucinations: Auditory  Organization:     Company secretaryxecutive Functions  Fund of Knowledge:  Fund of Knowledge: Good  Intelligence:  Intelligence: Above Average  Abstraction:  Abstraction: Normal  Judgement:  Judgement: Impaired  Reality Testing:  Reality Testing: Distorted (methamphetamine induced delusions)  Insight:  Insight: Lacking  Decision Making:  Decision Making: Impulsive  Social Functioning  Social Maturity:  Social Maturity: Irresponsible  Social Judgement:  Social Judgement: "Garment/textile technologist  Stress  Stressors:  Stressors: Housing, Illness, Armed forces operational officer, Surveyor, quantity, Relationship  Coping Ability:  Coping Ability: Deficient supports  Skill Deficits:  Skill Deficits: Decision making  Supports:  Supports: Family     Religion: Religion/Spirituality Are You A Religious Person?:  (not assessed)  Leisure/Recreation: Leisure / Recreation Do You Have Hobbies?: Yes Leisure and Hobbies: music  Exercise/Diet: Exercise/Diet Do You Exercise?: Yes What Type of Exercise Do You Do?: Run/Walk (has no car, walks daily) How Many Times a Week Do You Exercise?: 6-7 times a week Have You Gained or Lost A Significant Amount of Weight in the Past Six Months?: No Do You Follow a Special Diet?: No Do  You Have Any Trouble Sleeping?: No   CCA Employment/Education  Employment/Work Situation: Employment / Work Situation Employment situation: Biomedical scientist job has been impacted by current illness: Yes Describe how patient's job has been impacted: unable to maintain employment What is the longest time patient has a held a job?: not assessed Where was the patient employed at that time?: not assessed Has patient ever been in the [employer]?: No  Education: Education Is Patient Currently Attending School?: Yes Last Grade Completed:  (not assessed) Name of High School: not assessed Did Garment/textile technologist From [employer]?:  (not assessed) Did You Attend Graduate School?: No Did You Have An Individualized Education Program (IIEP): No Did You Have Any Difficulty At School?: No Patient's Education Has Been Impacted by Current Illness: No   CCA Family/Childhood History  Family and Relationship History: Family history Marital status: Single Are you sexually active?: Yes What is your sexual orientation?: homosexual Has your sexual activity been affected by drugs, alcohol, medication, or emotional stress?: patient uses sex as a resource for housing and drugs Does patient have children?: No  Childhood History:  Childhood History By whom was/is the patient raised?: Mother Description of patient's relationship with caregiver when they were a child: not assessed Patient's description of current relationship with people who raised him/her: not asessed How were you disciplined when you got in trouble as a child/adolescent?: not assessed Does patient have siblings?: Yes Number of Siblings:  (not assessed) Description of patient's current relationship with siblings: TTS is aware that patient has one brother who is a negative influence on him Did patient suffer any verbal/emotional/physical/sexual abuse as a child?:  (not assessed) Did patient suffer from severe childhood neglect?:  (not  assessed) Has patient ever been sexually abused/assaulted/raped as an adolescent or adult?:  (not assessed) Was the patient ever a victim of a crime or a disaster?:  (not assessed) Witnessed domestic violence?:  (not assessed) Has patient been affected by domestic violence as an adult?:  (not assessed)  Child/Adolescent Assessment:     CCA Substance Use  Alcohol/Drug Use: Alcohol / Drug Use Pain Medications: See MAR Prescriptions: See MAR Over the Counter: See MAR History of alcohol / drug use?: Yes Longest period of sobriety (when/how long): Unknown Negative Consequences of Use: Personal relationships, Financial, Work / School Substance #1 Name of Substance 1: methamphetamine 1 - Age of First Use: unknown 1 - Amount (size/oz): varies 1 - Frequency: 1-2 times weekly 1 - Duration: on-going 1 - Last Use / Amount: last use was  today, amount unknown Substance #2 Name of Substance 2: alcohol 2 - Age of First Use: unknown 2 - Amount (size/oz): 2-3 beers 2 - Frequency: patient states that he drinks 2-3 days per week 2 - Duration: Ongoing 2 - Last Use / Amount: unknown                     ASAM's:  Six Dimensions of Multidimensional Assessment  Dimension 1:  Acute Intoxication and/or Withdrawal Potential:   Dimension 1:  Description of individual's past and current experiences of substance use and withdrawal: Patient has no current withdrawal symptoms.  However, when he is intoxicated on the drug, he is extremely paranoid and delusional  Dimension 2:  Biomedical Conditions and Complications:   Dimension 2:  Description of patient's biomedical conditions and  complications: When patient is abusing methamphetamine he is non-compliant with his HIV meds and becomes resistent to the benfit of the medication  Dimension 3:  Emotional, Behavioral, or Cognitive Conditions and Complications:  Dimension 3:  Description of emotional, behavioral, or cognitive conditions and  complications: Patient uses drugs to self-medicate his emotional issues.  He is generally non-complian with his medication for his depression  Dimension 4:  Readiness to Change:  Dimension 4:  Description of Readiness to Change criteria: Today, patient states that he is willing to take the steps necessary for him to change  Dimension 5:  Relapse, Continued use, or Continued Problem Potential:  Dimension 5:  Relapse, continued use, or continued problem potential critiera description: Patient has a minimal history of clean time and is a chronic relapser  Dimension 6:  Recovery/Living Environment:  Dimension 6:  Recovery/Iiving environment criteria description: Patient has an unstable living situation with minimal support  ASAM Severity Score: ASAM's Severity Rating Score: 15  ASAM Recommended Level of Treatment: ASAM Recommended Level of Treatment: Level III Residential Treatment   Substance use Disorder (SUD) Substance Use Disorder (SUD)  Checklist Symptoms of Substance Use: Continued use despite having a persistent/recurrent physical/psychological problem caused/exacerbated by use, Continued use despite persistent or recurrent social, interpersonal problems, caused or exacerbated by use, Large amounts of time spent to obtain, use or recover from the substance(s), Persistent desire or unsuccessful efforts to cut down or control use, Presence of craving or strong urge to use, Recurrent use that results in a failure to fulfill major role obligations (work, school, home), Repeated use in physically hazardous situations, Social, occupational, recreational activities given up or reduced due to use, Substance(s) often taken in larger amounts or over longer times than was intended  Recommendations for Services/Supports/Treatments: Recommendations for Services/Supports/Treatments Recommendations For Services/Supports/Treatments: Residential-Level 3  DSM5 Diagnoses: Patient Active Problem List   Diagnosis  Date Noted  . Severe episode of recurrent major depressive disorder, with psychotic features (HCC)   . Amphetamine and psychostimulant-induced psychosis with delusions (HCC)   . Methamphetamine use disorder, severe, dependence (HCC) 03/23/2020  . 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 Acute psychosis (HCC)   . Substance use disorder 08/07/2019  . Encephalopathy 08/06/2019  . Chronic hepatitis C without hepatic coma (HCC) 08/17/2017  . Screening examination for venereal disease 05/03/2017  . Encounter for long-term (current) use of high-risk medication 05/03/2017  . Anxiety 09/26/2016  . Substance abuse (HCC) 09/26/2016  . Human immunodeficiency virus (HIV) disease (HCC) 09/02/2014  . History of syphilis 08/25/2014    Disposition:  Per Reola Calkins, NP, Patient will be continuously monitored overnight and hopefully will be placed at the [facility]  if they feel like he is an appropriate admission.  Referrals to Alternative Service(s): Referred to Alternative Service(s):   Place:   Date:   Time:    Referred to Alternative Service(s):   Place:   Date:   Time:    Referred to Alternative Service(s):   Place:   Date:   Time:    Referred to Alternative Service(s):   Place:   Date:   Time:     Arnoldo Lenis Baron Parmelee

## 2020-04-18 NOTE — Progress Notes (Addendum)
Received Justin [name] this PM in the assessment room, he was cooperative with the Covid test and blood draw. He initially denied all of the psychiatric symptoms,  Then he endorsed + AV hallucinations. He endorsed feeling anxious and depressed with thoughts of hurting himself and others without a plan. His behavior mimic's his endorsements   He was given a roast beef sandwich and a glass of water. The skin assessment was completed with Kasie, MHT, noted tattoos on upper arms, no cuts, but old bruises with the appearance of drug tracks marks on his arms.

## 2020-04-18 NOTE — ED Notes (Signed)
Locker #[id number]  

## 2020-04-18 NOTE — ED Notes (Signed)
Pt sleeping at present, no distress noted, monitoring for safety. Calm at present.

## 2020-04-18 NOTE — ED Provider Notes (Signed)
Behavioral Health Admission H&P [facility] & OBS)  Date: 04/18/20 Patient Name: Justin [name] MRN: [id number] Chief Complaint: No chief complaint on file.     Diagnoses:  Final diagnoses:  Methamphetamine-induced mood disorder (HCC)    HPI: Patient is a 39 year old male who presented to the [facility] with [facility].  [facility] reports that the patient contacted them reporting that he was having hallucinations and wanted to get help before things got worse.  Patient reports that his hallucinations have begun a little bit worse.  He states that the voices are telling him that he has been doing some bad things and that his family is dead.  He states he knows that these are not true but they will not stop.  He states that the voices are so severe that he has had thoughts about killing himself or harming other people because he thinks that he is or the actual people around him that are telling him these things.  Patient reports compliance with his medications but then denies following up with outpatient psychiatric resources.  Patient states that he just wants to get stable and tenuous medications.  Patient does admit to using some methamphetamines recently and states that it was a small amount but does not report exactly when he last used these drugs.  PHQ 2-9:    ED from 04/10/2020 in [facility] ED from 01/07/2020 in [facility] EMERGENCY DEPARTMENT Office Visit from 09/26/2016 in [facility] for Infectious Disease  Thoughts that you would be better off dead, or of hurting yourself in some way Nearly every day Nearly every day --  PHQ-9 Total Score 22 27 0        ED from 04/10/2020 in [facility] from 03/26/2020 in [facility] ED from 01/04/2020 in [facility] EMERGENCY DEPARTMENT  C-SSRS RISK CATEGORY Error: Question 2 not populated No Risk Error: Q3, 4,  or 5 should not be populated when Q2 is No       Total Time spent with patient: 30 minutes  Musculoskeletal  Strength & Muscle Tone: within normal limits Gait & Station: normal Patient leans: N/A  Psychiatric Specialty Exam  Presentation General Appearance: Disheveled  Eye Contact:Fair  Speech:Clear and Coherent;Normal Rate  Speech Volume:Normal  Handedness:Right   Mood and Affect  Mood:Anxious  Affect:Constricted   Thought Process  Thought Processes:Coherent  Descriptions of Associations:Intact  Orientation:Full (Time, Place and Person)  Thought Content:Paranoid Ideation  Hallucinations:Hallucinations: None;Auditory Description of Auditory Hallucinations: Voices are telling my family is dead and bad things about himself  Ideas of Reference:None  Suicidal Thoughts:Suicidal Thoughts: Yes, Passive SI Passive Intent and/or Plan: Without Intent;Without Plan  Homicidal Thoughts:Homicidal Thoughts: No   Sensorium  Memory:Immediate Good;Recent Good;Remote Good  Judgment:Fair  Insight:Fair   Executive Functions  Concentration:Good  Attention Span:Good  Recall:Good  Fund of Knowledge:Fair  Language:Good   Psychomotor Activity  Psychomotor Activity:Psychomotor Activity: Normal   Assets  Assets:Communication Skills;Desire for Improvement;Housing   Sleep  Sleep:Sleep: Fair   Physical Exam Vitals and nursing note reviewed.  Constitutional:      Appearance: He is well-developed.  HENT:     Head: Normocephalic.  Eyes:     Pupils: Pupils are equal, round, and reactive to light.  Cardiovascular:     Rate and Rhythm: Normal rate.  Pulmonary:     Effort: Pulmonary effort is normal.  Musculoskeletal:  General: Normal range of motion.  Neurological:     Mental Status: He is alert and oriented to person, place, and time.    Review of Systems  Constitutional: Negative.   HENT: Negative.   Eyes: Negative.   Respiratory: Negative.    Cardiovascular: Negative.   Gastrointestinal: Negative.   Genitourinary: Negative.   Musculoskeletal: Negative.   Skin: Negative.   Neurological: Negative.   Endo/Heme/Allergies: Negative.   Psychiatric/Behavioral: Positive for hallucinations and substance abuse.    There were no vitals taken for this visit. There is no height or weight on file to calculate BMI.  Past Psychiatric History: Psychosis, polysubstance abuse, substance-induced mood disorder  Is the patient at risk to self? Yes  Has the patient been a risk to self in the past 6 months? Yes .    Has the patient been a risk to self within the distant past? Yes   Is the patient a risk to others? Yes   Has the patient been a risk to others in the past 6 months? No   Has the patient been a risk to others within the distant past? No   Past Medical History:  Past Medical History:  Diagnosis Date  . Endocarditis   . Hepatitis C   . History of syphilis 08/25/2014   Treated by [facility] 07-2014.  [name] HIV (human immunodeficiency virus infection) (HCC)   . Seizures (HCC)     Past Surgical History:  Procedure Laterality Date  . hand surgery Right   . LUMBAR PUNCTURE  08/08/2019      . WRIST SURGERY      Family History:  Family History  Adopted: Yes  Family history unknown: Yes    Social History:  Social History   Socioeconomic History  . Marital status: Widowed    Spouse name: Not on file  . Number of children: Not on file  . Years of education: Not on file  . Highest education level: Not on file  Occupational History  . Not on file  Tobacco Use  . Smoking status: Current Every Day Smoker    Types: Cigarettes  . Smokeless tobacco: Never Used  Vaping Use  . Vaping Use: Never used  Substance and Sexual Activity  . Alcohol use: Yes    Comment: 1-2 a week   . Drug use: Yes    Frequency: 14.0 times per week    Types: Marijuana, Methamphetamines  . Sexual activity: Yes    Partners: Male    Birth control/protection:  Condom    Comment: given condoms  Other Topics Concern  . Not on file  Social History Narrative   ** Merged History Encounter **       ** Merged History Encounter **       ** Merged History Encounter **       Social Determinants of Corporate investment banker Strain:   . Difficulty of Paying Living Expenses: Not on file  Food Insecurity:   . Worried About Programme researcher, broadcasting/film/video in the Last Year: Not on file  . Ran Out of Food in the Last Year: Not on file  Transportation Needs:   . Lack of Transportation (Medical): Not on file  . Lack of Transportation (Non-Medical): Not on file  Physical Activity:   . Days of Exercise per Week: Not on file  . Minutes of Exercise per Session: Not on file  Stress:   . Feeling of Stress : Not on file  Social Connections:   .  Frequency of Communication with Friends and Family: Not on file  . Frequency of Social Gatherings with Friends and Family: Not on file  . Attends Religious Services: Not on file  . Active Member of Clubs or Organizations: Not on file  . Attends Banker Meetings: Not on file  . Marital Status: Not on file  Intimate Partner Violence:   . Fear of Current or Ex-Partner: Not on file  . Emotionally Abused: Not on file  . Physically Abused: Not on file  . Sexually Abused: Not on file    SDOH:  SDOH Screenings   Alcohol Screen:   . Last Alcohol Screening Score (AUDIT): Not on file  Depression (PHQ2-9): Medium Risk  . PHQ-2 Score: 22  Financial Resource Strain:   . Difficulty of Paying Living Expenses: Not on file  Food Insecurity:   . Worried About Programme researcher, broadcasting/film/video in the Last Year: Not on file  . Ran Out of Food in the Last Year: Not on file  Housing:   . Last Housing Risk Score: Not on file  Physical Activity:   . Days of Exercise per Week: Not on file  . Minutes of Exercise per Session: Not on file  Social Connections:   . Frequency of Communication with Friends and Family: Not on file  .  Frequency of Social Gatherings with Friends and Family: Not on file  . Attends Religious Services: Not on file  . Active Member of Clubs or Organizations: Not on file  . Attends Banker Meetings: Not on file  . Marital Status: Not on file  Stress:   . Feeling of Stress : Not on file  Tobacco Use: High Risk  . Smoking Tobacco Use: Current Every Day Smoker  . Smokeless Tobacco Use: Never Used  Transportation Needs:   . Freight forwarder (Medical): Not on file  . Lack of Transportation (Non-Medical): Not on file    Last Labs:  Admission on 04/10/2020, Discharged on 04/12/2020  Component Date Value Ref Range Status  . SARS Coronavirus 2 by RT PCR 04/10/2020 NEGATIVE  NEGATIVE Final   Comment: (NOTE) SARS-CoV-2 target nucleic acids are NOT DETECTED.  The SARS-CoV-2 RNA is generally detectable in upper respiratoy specimens during the acute phase of infection. The lowest concentration of SARS-CoV-2 viral copies this assay can detect is 131 copies/mL. A negative result does not preclude SARS-Cov-2 infection and should not be used as the sole basis for treatment or other patient management decisions. A negative result may occur with  improper specimen collection/handling, submission of specimen other than nasopharyngeal swab, presence of viral mutation(s) within the areas targeted by this assay, and inadequate number of viral copies (<131 copies/mL). A negative result must be combined with clinical observations, patient history, and epidemiological information. The expected result is Negative.  Fact Sheet for Patients:  https://www.moore.com/  Fact Sheet for Healthcare Providers:  https://www.young.biz/  This test is no                          t yet approved or cleared by the [place] FDA and  has been authorized for detection and/or diagnosis of SARS-CoV-2 by FDA under an Emergency Use Authorization (EUA). This EUA will  remain  in effect (meaning this test can be used) for the duration of the COVID-19 declaration under Section 564(b)(1) of the Act, 21 U.S.C. section 360bbb-3(b)(1), unless the authorization is terminated or revoked sooner.    [name]  Influenza A by PCR 04/10/2020 NEGATIVE  NEGATIVE Final  . Influenza B by PCR 04/10/2020 NEGATIVE  NEGATIVE Final   Comment: (NOTE) The Xpert Xpress SARS-CoV-2/FLU/RSV assay is intended as an aid in  the diagnosis of influenza from Nasopharyngeal swab specimens and  should not be used as a sole basis for treatment. Nasal washings and  aspirates are unacceptable for Xpert Xpress SARS-CoV-2/FLU/RSV  testing.  Fact Sheet for Patients: https://www.moore.com/  Fact Sheet for Healthcare Providers: https://www.young.biz/  This test is not yet approved or cleared by the [place] FDA and  has been authorized for detection and/or diagnosis of SARS-CoV-2 by  FDA under an Emergency Use Authorization (EUA). This EUA will remain  in effect (meaning this test can be used) for the duration of the  Covid-19 declaration under Section 564(b)(1) of the Act, 21  U.S.C. section 360bbb-3(b)(1), unless the authorization is  terminated or revoked. Performed at [facility], [address], [place], [place] [address]   . Sodium 04/10/2020 141  135 - 145 mmol/L Final  . Potassium 04/10/2020 3.6  3.5 - 5.1 mmol/L Final  . Chloride 04/10/2020 103  98 - 111 mmol/L Final  . CO2 04/10/2020 26  22 - 32 mmol/L Final  . Glucose, Bld 04/10/2020 96  70 - 99 mg/dL Final   Glucose reference range applies only to samples taken after fasting for at least 8 hours.  . BUN 04/10/2020 16  6 - 20 mg/dL Final  . Creatinine, Ser 04/10/2020 1.18  0.61 - 1.24 mg/dL Final  . Calcium 38/04/1750 9.6  8.9 - 10.3 mg/dL Final  . Total Protein 04/10/2020 8.1  6.5 - 8.1 g/dL Final  . Albumin 02/58/5277 4.8  3.5 - 5.0 g/dL Final  . AST 82/42/3536  30  15 - 41 U/L Final  . ALT 04/10/2020 29  0 - 44 U/L Final  . Alkaline Phosphatase 04/10/2020 85  38 - 126 U/L Final  . Total Bilirubin 04/10/2020 2.1* 0.3 - 1.2 mg/dL Final  . GFR, Estimated 04/10/2020 >60  >60 mL/min Final  . Anion gap 04/10/2020 12  5 - 15 Final   Performed at [facility], [address] [place], [place] [address]  . Alcohol, Ethyl (B) 04/10/2020 <10  <10 mg/dL Final   Comment: (NOTE) Lowest detectable limit for serum alcohol is 10 mg/dL.  For medical purposes only. Performed at [facility], [address], [place], [place] [address]   . Opiates 04/11/2020 NONE DETECTED  NONE DETECTED Final  . Cocaine 04/11/2020 NONE DETECTED  NONE DETECTED Final  . Benzodiazepines 04/11/2020 NONE DETECTED  NONE DETECTED Final  . Amphetamines 04/11/2020 POSITIVE* NONE DETECTED Final  . Tetrahydrocannabinol 04/11/2020 NONE DETECTED  NONE DETECTED Final  . Barbiturates 04/11/2020 NONE DETECTED  NONE DETECTED Final   Comment: (NOTE) DRUG SCREEN FOR MEDICAL PURPOSES ONLY.  IF CONFIRMATION IS NEEDED FOR ANY PURPOSE, NOTIFY LAB WITHIN 5 DAYS.  LOWEST DETECTABLE LIMITS FOR URINE DRUG SCREEN Drug Class                     Cutoff (ng/mL) Amphetamine and metabolites    1000 Barbiturate and metabolites    200 Benzodiazepine                 200 Tricyclics and metabolites     300 Opiates and metabolites        300 Cocaine and metabolites        300  THC                            50 Performed at [facility], [address], [place], [place] [address]   . WBC 04/10/2020 10.1  4.0 - 10.5 K/uL Final  . RBC 04/10/2020 4.68  4.22 - 5.81 MIL/uL Final  . Hemoglobin 04/10/2020 15.0  13.0 - 17.0 g/dL Final  . HCT 78/29/5621 45.1  39 - 52 % Final  . MCV 04/10/2020 96.4  80.0 - 100.0 fL Final  . MCH 04/10/2020 32.1  26.0 - 34.0 pg Final  . MCHC 04/10/2020 33.3  30.0 - 36.0 g/dL Final  . RDW 30/86/5784 12.3  11.5 - 15.5 % Final   . Platelets 04/10/2020 243  150 - 400 K/uL Final  . nRBC 04/10/2020 0.0  0.0 - 0.2 % Final  . Neutrophils Relative % 04/10/2020 67  % Final  . Neutro Abs 04/10/2020 6.7  1.7 - 7.7 K/uL Final  . Lymphocytes Relative 04/10/2020 20  % Final  . Lymphs Abs 04/10/2020 2.1  0.7 - 4.0 K/uL Final  . Monocytes Relative 04/10/2020 13  % Final  . Monocytes Absolute 04/10/2020 1.3* 0.1 - 1.0 K/uL Final  . Eosinophils Relative 04/10/2020 0  % Final  . Eosinophils Absolute 04/10/2020 0.0  0.0 - 0.5 K/uL Final  . Basophils Relative 04/10/2020 0  % Final  . Basophils Absolute 04/10/2020 0.0  0.0 - 0.1 K/uL Final  . Immature Granulocytes 04/10/2020 0  % Final  . Abs Immature Granulocytes 04/10/2020 0.04  0.00 - 0.07 K/uL Final   Performed at [facility], [address] [place], [place] [address]  Admission on 03/26/2020, Discharged on 03/27/2020  Component Date Value Ref Range Status  . WBC 03/26/2020 7.1  4.0 - 10.5 K/uL Final  . RBC 03/26/2020 5.23  4.22 - 5.81 MIL/uL Final  . Hemoglobin 03/26/2020 17.1* 13.0 - 17.0 g/dL Final  . HCT 52/84/1324 50.8  39 - 52 % Final  . MCV 03/26/2020 97.1  80.0 - 100.0 fL Final  . MCH 03/26/2020 32.7  26.0 - 34.0 pg Final  . MCHC 03/26/2020 33.7  30.0 - 36.0 g/dL Final  . RDW 40/04/2724 12.2  11.5 - 15.5 % Final  . Platelets 03/26/2020 233  150 - 400 K/uL Final  . nRBC 03/26/2020 0.0  0.0 - 0.2 % Final  . Neutrophils Relative % 03/26/2020 50  % Final  . Neutro Abs 03/26/2020 3.5  1.7 - 7.7 K/uL Final  . Lymphocytes Relative 03/26/2020 37  % Final  . Lymphs Abs 03/26/2020 2.6  0.7 - 4.0 K/uL Final  . Monocytes Relative 03/26/2020 11  % Final  . Monocytes Absolute 03/26/2020 0.8  0.1 - 1.0 K/uL Final  . Eosinophils Relative 03/26/2020 1  % Final  . Eosinophils Absolute 03/26/2020 0.1  0.0 - 0.5 K/uL Final  . Basophils Relative 03/26/2020 1  % Final  . Basophils Absolute 03/26/2020 0.0  0.0 - 0.1 K/uL Final  . Immature Granulocytes 03/26/2020  0  % Final  . Abs Immature Granulocytes 03/26/2020 0.02  0.00 - 0.07 K/uL Final   Performed at [facility], [address], [place], [place] [address]  . Sodium 03/26/2020 142  135 - 145 mmol/L Final  . Potassium 03/26/2020 3.8  3.5 - 5.1 mmol/L Final  . Chloride 03/26/2020 102  98 - 111 mmol/L Final  . CO2 03/26/2020  25  22 - 32 mmol/L Final  . Glucose, Bld 03/26/2020 96  70 - 99 mg/dL Final   Glucose reference range applies only to samples taken after fasting for at least 8 hours.  . BUN 03/26/2020 13  6 - 20 mg/dL Final  . Creatinine, Ser 03/26/2020 1.15  0.61 - 1.24 mg/dL Final  . Calcium 78/29/5621 10.2  8.9 - 10.3 mg/dL Final  . Total Protein 03/26/2020 8.7* 6.5 - 8.1 g/dL Final  . Albumin 30/86/5784 4.9  3.5 - 5.0 g/dL Final  . AST 69/62/9528 37  15 - 41 U/L Final  . ALT 03/26/2020 36  0 - 44 U/L Final  . Alkaline Phosphatase 03/26/2020 86  38 - 126 U/L Final  . Total Bilirubin 03/26/2020 1.8* 0.3 - 1.2 mg/dL Final  . GFR calc non Af Amer 03/26/2020 >60  >60 mL/min Final  . GFR calc Af Amer 03/26/2020 >60  >60 mL/min Final  . Anion gap 03/26/2020 15  5 - 15 Final   Performed at [facility], [address], [place], [place] [address]  . SARS Coronavirus 2 by RT PCR 03/26/2020 NEGATIVE  NEGATIVE Final   Comment: (NOTE) SARS-CoV-2 target nucleic acids are NOT DETECTED.  The SARS-CoV-2 RNA is generally detectable in upper respiratoy specimens during the acute phase of infection. The lowest concentration of SARS-CoV-2 viral copies this assay can detect is 131 copies/mL. A negative result does not preclude SARS-Cov-2 infection and should not be used as the sole basis for treatment or other patient management decisions. A negative result may occur with  improper specimen collection/handling, submission of specimen other than nasopharyngeal swab, presence of viral mutation(s) within the areas targeted by this assay, and inadequate number of  viral copies (<131 copies/mL). A negative result must be combined with clinical observations, patient history, and epidemiological information. The expected result is Negative.  Fact Sheet for Patients:  https://www.moore.com/  Fact Sheet for Healthcare Providers:  https://www.young.biz/  This test is no                          t yet approved or cleared by the [place] FDA and  has been authorized for detection and/or diagnosis of SARS-CoV-2 by FDA under an Emergency Use Authorization (EUA). This EUA will remain  in effect (meaning this test can be used) for the duration of the COVID-19 declaration under Section 564(b)(1) of the Act, 21 U.S.C. section 360bbb-3(b)(1), unless the authorization is terminated or revoked sooner.    . Influenza A by PCR 03/26/2020 NEGATIVE  NEGATIVE Final  . Influenza B by PCR 03/26/2020 NEGATIVE  NEGATIVE Final   Comment: (NOTE) The Xpert Xpress SARS-CoV-2/FLU/RSV assay is intended as an aid in  the diagnosis of influenza from Nasopharyngeal swab specimens and  should not be used as a sole basis for treatment. Nasal washings and  aspirates are unacceptable for Xpert Xpress SARS-CoV-2/FLU/RSV  testing.  Fact Sheet for Patients: https://www.moore.com/  Fact Sheet for Healthcare Providers: https://www.young.biz/  This test is not yet approved or cleared by the [place] FDA and  has been authorized for detection and/or diagnosis of SARS-CoV-2 by  FDA under an Emergency Use Authorization (EUA). This EUA will remain  in effect (meaning this test can be used) for the duration of the  Covid-19 declaration under Section 564(b)(1) of the Act, 21  U.S.C. section 360bbb-3(b)(1), unless the authorization is  terminated or revoked.   [name] Respiratory Syncytial  Virus by PCR 03/26/2020 NEGATIVE  NEGATIVE Final   Comment: (NOTE) Fact Sheet for  Patients: https://www.moore.com/  Fact Sheet for Healthcare Providers: https://www.young.biz/  This test is not yet approved or cleared by the [place] FDA and  has been authorized for detection and/or diagnosis of SARS-CoV-2 by  FDA under an Emergency Use Authorization (EUA). This EUA will remain  in effect (meaning this test can be used) for the duration of the  COVID-19 declaration under Section 564(b)(1) of the Act, 21 U.S.C.  section 360bbb-3(b)(1), unless the authorization is terminated or  revoked. Performed at [facility], [address] [place], [place] [address]   . Opiates 03/26/2020 NONE DETECTED  NONE DETECTED Final  . Cocaine 03/26/2020 NONE DETECTED  NONE DETECTED Final  . Benzodiazepines 03/26/2020 POSITIVE* NONE DETECTED Final  . Amphetamines 03/26/2020 POSITIVE* NONE DETECTED Final  . Tetrahydrocannabinol 03/26/2020 POSITIVE* NONE DETECTED Final  . Barbiturates 03/26/2020 NONE DETECTED  NONE DETECTED Final   Comment: (NOTE) DRUG SCREEN FOR MEDICAL PURPOSES ONLY.  IF CONFIRMATION IS NEEDED FOR ANY PURPOSE, NOTIFY LAB WITHIN 5 DAYS.  LOWEST DETECTABLE LIMITS FOR URINE DRUG SCREEN Drug Class                     Cutoff (ng/mL) Amphetamine and metabolites    1000 Barbiturate and metabolites    200 Benzodiazepine                 200 Tricyclics and metabolites     300 Opiates and metabolites        300 Cocaine and metabolites        300 THC                            50 Performed at [facility], [address], [place], [place] [address]   . Alcohol, Ethyl (B) 03/26/2020 <10  <10 mg/dL Final   Comment: (NOTE) Lowest detectable limit for serum alcohol is 10 mg/dL.  For medical purposes only. Performed at [facility], [address] [place], [place] [address]   . Salicylate Lvl 03/26/2020 <7.0* 7.0 - 30.0 mg/dL Final   Performed at [facility], [address], [place], [place] [address]  . Acetaminophen (Tylenol), Serum 03/26/2020 <10* 10 - 30 ug/mL Final   Comment: (NOTE) Therapeutic concentrations vary significantly. A range of 10-30 ug/mL  may be an effective concentration for many patients. However, some  are best treated at concentrations outside of this range. Acetaminophen concentrations >150 ug/mL at 4 hours after ingestion  and >50 ug/mL at 12 hours after ingestion are often associated with  toxic reactions.  Performed at [facility], [address], [place], [place] [address]   Admission on 03/22/2020, Discharged on 03/23/2020  Component Date Value Ref Range Status  . SARS Coronavirus 2 03/22/2020 NEGATIVE  NEGATIVE Final   Comment: (NOTE) SARS-CoV-2 target nucleic acids are NOT DETECTED.  The SARS-CoV-2 RNA is generally detectable in upper and lower respiratory specimens during the acute phase of infection. The lowest concentration of SARS-CoV-2 viral copies this assay can detect is 250 copies / mL. A negative result does not preclude SARS-CoV-2 infection and should not be used as the sole basis for treatment or other patient management decisions.  A negative result may occur with improper specimen collection / handling, submission of specimen other than nasopharyngeal swab, presence of viral mutation(s)  within the areas targeted by this assay, and inadequate number of viral copies (<250 copies / mL). A negative result must be combined with clinical observations, patient history, and epidemiological information.  Fact Sheet for Patients:   BoilerBrush.com.cy  Fact Sheet for Healthcare Providers: https://pope.com/  This test is not yet approved or                           cleared by the [place] FDA and has been authorized for detection and/or diagnosis of SARS-CoV-2 by FDA under an Emergency Use Authorization (EUA).  This  EUA will remain in effect (meaning this test can be used) for the duration of the COVID-19 declaration under Section 564(b)(1) of the Act, 21 U.S.C. section 360bbb-3(b)(1), unless the authorization is terminated or revoked sooner.  Performed at [facility], [address] [place], [place] [address]   . Sodium 03/22/2020 137  135 - 145 mmol/L Final  . Potassium 03/22/2020 3.2* 3.5 - 5.1 mmol/L Final  . Chloride 03/22/2020 101  98 - 111 mmol/L Final  . CO2 03/22/2020 21* 22 - 32 mmol/L Final  . Glucose, Bld 03/22/2020 144* 70 - 99 mg/dL Final   Glucose reference range applies only to samples taken after fasting for at least 8 hours.  . BUN 03/22/2020 10  6 - 20 mg/dL Final  . Creatinine, Ser 03/22/2020 1.03  0.61 - 1.24 mg/dL Final  . Calcium 91/47/8295 9.7  8.9 - 10.3 mg/dL Final  . Total Protein 03/22/2020 8.1  6.5 - 8.1 g/dL Final  . Albumin 62/13/0865 5.0  3.5 - 5.0 g/dL Final  . AST 78/46/9629 70* 15 - 41 U/L Final  . ALT 03/22/2020 49* 0 - 44 U/L Final  . Alkaline Phosphatase 03/22/2020 78  38 - 126 U/L Final  . Total Bilirubin 03/22/2020 3.8* 0.3 - 1.2 mg/dL Final  . GFR calc non Af Amer 03/22/2020 >60  >60 mL/min Final  . GFR calc Af Amer 03/22/2020 >60  >60 mL/min Final  . Anion gap 03/22/2020 15  5 - 15 Final   Performed at [facility] [address] [place], [place] [address]  . Alcohol, Ethyl (B) 03/22/2020 <10  <10 mg/dL Final   Comment: (NOTE) Lowest detectable limit for serum alcohol is 10 mg/dL.  For medical purposes only. Performed at [facility], [address] [place], [place] [address]   . Opiates 03/22/2020 NONE DETECTED  NONE DETECTED Final  . Cocaine 03/22/2020 NONE DETECTED  NONE DETECTED Final  . Benzodiazepines 03/22/2020 POSITIVE* NONE DETECTED Final  . Amphetamines 03/22/2020 POSITIVE* NONE DETECTED Final  . Tetrahydrocannabinol 03/22/2020 NONE DETECTED  NONE DETECTED Final  .  Barbiturates 03/22/2020 NONE DETECTED  NONE DETECTED Final   Comment: (NOTE) DRUG SCREEN FOR MEDICAL PURPOSES ONLY.  IF CONFIRMATION IS NEEDED FOR ANY PURPOSE, NOTIFY LAB WITHIN 5 DAYS.  LOWEST DETECTABLE LIMITS FOR URINE DRUG SCREEN Drug Class                     Cutoff (ng/mL) Amphetamine and metabolites    1000 Barbiturate and metabolites    200 Benzodiazepine                 200 Tricyclics and metabolites     300 Opiates and metabolites        300 Cocaine and metabolites        300 THC  50 Performed at [facility], [address], [place], [place] [address]   . WBC 03/22/2020 7.3  4.0 - 10.5 K/uL Final  . RBC 03/22/2020 4.98  4.22 - 5.81 MIL/uL Final  . Hemoglobin 03/22/2020 16.2  13.0 - 17.0 g/dL Final  . HCT 56/21/3086 47.5  39 - 52 % Final  . MCV 03/22/2020 95.4  80.0 - 100.0 fL Final  . MCH 03/22/2020 32.5  26.0 - 34.0 pg Final  . MCHC 03/22/2020 34.1  30.0 - 36.0 g/dL Final  . RDW 57/84/6962 12.5  11.5 - 15.5 % Final  . Platelets 03/22/2020 191  150 - 400 K/uL Final  . nRBC 03/22/2020 0.0  0.0 - 0.2 % Final  . Neutrophils Relative % 03/22/2020 67  % Final  . Neutro Abs 03/22/2020 4.8  1.7 - 7.7 K/uL Final  . Lymphocytes Relative 03/22/2020 22  % Final  . Lymphs Abs 03/22/2020 1.6  0.7 - 4.0 K/uL Final  . Monocytes Relative 03/22/2020 11  % Final  . Monocytes Absolute 03/22/2020 0.8  0.1 - 1.0 K/uL Final  . Eosinophils Relative 03/22/2020 0  % Final  . Eosinophils Absolute 03/22/2020 0.0  0.0 - 0.5 K/uL Final  . Basophils Relative 03/22/2020 0  % Final  . Basophils Absolute 03/22/2020 0.0  0.0 - 0.1 K/uL Final  . Immature Granulocytes 03/22/2020 0  % Final  . Abs Immature Granulocytes 03/22/2020 0.01  0.00 - 0.07 K/uL Final   Performed at [facility], [address] [place], [place] [address]  . Salicylate Lvl 03/22/2020 <7.0* 7.0 - 30.0 mg/dL Final   Performed at [facility],  [address], [place], [place] [address]  . Acetaminophen (Tylenol), Serum 03/22/2020 <10* 10 - 30 ug/mL Final   Comment: (NOTE) Therapeutic concentrations vary significantly. A range of 10-30 ug/mL  may be an effective concentration for many patients. However, some  are best treated at concentrations outside of this range. Acetaminophen concentrations >150 ug/mL at 4 hours after ingestion  and >50 ug/mL at 12 hours after ingestion are often associated with  toxic reactions.  Performed at [facility], [address] [place], [place] [address]   Admission on 03/20/2020, Discharged on 03/21/2020  Component Date Value Ref Range Status  . Acetaminophen (Tylenol), Serum 03/20/2020 <10* 10 - 30 ug/mL Final   Comment: (NOTE) Therapeutic concentrations vary significantly. A range of 10-30 ug/mL  may be an effective concentration for many patients. However, some  are best treated at concentrations outside of this range. Acetaminophen concentrations >150 ug/mL at 4 hours after ingestion  and >50 ug/mL at 12 hours after ingestion are often associated with  toxic reactions.  Performed at [facility], [address] [place], [place] [address]   . WBC 03/20/2020 8.5  4.0 - 10.5 K/uL Final  . RBC 03/20/2020 5.37  4.22 - 5.81 MIL/uL Final  . Hemoglobin 03/20/2020 17.5* 13.0 - 17.0 g/dL Final  . HCT 44/09/4740 50.5  39 - 52 % Final  . MCV 03/20/2020 94.0  80.0 - 100.0 fL Final  . MCH 03/20/2020 32.6  26.0 - 34.0 pg Final  . MCHC 03/20/2020 34.7  30.0 - 36.0 g/dL Final  . RDW 59/56/3875 12.5  11.5 - 15.5 % Final  . Platelets 03/20/2020 214  150 - 400 K/uL Final  . nRBC 03/20/2020 0.0  0.0 - 0.2 % Final   Performed at [facility], [address]  [place], [place] [address]  . Sodium 03/20/2020 137  135 - 145 mmol/L Final   LYTES REPEATED / JAG  . Potassium 03/20/2020 3.4* 3.5 - 5.1 mmol/L Final  . Chloride 03/20/2020 99  98 - 111 mmol/L Final  .  CO2 03/20/2020 12* 22 - 32 mmol/L Final  . Glucose, Bld 03/20/2020 150* 70 - 99 mg/dL Final   Glucose reference range applies only to samples taken after fasting for at least 8 hours.  . BUN 03/20/2020 13  6 - 20 mg/dL Final  . Creatinine, Ser 03/20/2020 1.38* 0.61 - 1.24 mg/dL Final  . Calcium 60/45/4098 9.9  8.9 - 10.3 mg/dL Final  . Total Protein 03/20/2020 8.8* 6.5 - 8.1 g/dL Final  . Albumin 11/91/4782 5.0  3.5 - 5.0 g/dL Final  . AST 95/62/1308 61* 15 - 41 U/L Final  . ALT 03/20/2020 46* 0 - 44 U/L Final   RESULT CONFIRMED BY MANUAL DILUTION / JAG  . Alkaline Phosphatase 03/20/2020 89  38 - 126 U/L Final  . Total Bilirubin 03/20/2020 3.3* 0.3 - 1.2 mg/dL Final  . GFR calc non Af Amer 03/20/2020 >60  >60 mL/min Final  . GFR calc Af Amer 03/20/2020 >60  >60 mL/min Final  . Anion gap 03/20/2020 26* 5 - 15 Final   Performed at [facility], [address] [place], [place] [address]  . Alcohol, Ethyl (B) 03/20/2020 <10  <10 mg/dL Final   Comment: (NOTE) Lowest detectable limit for serum alcohol is 10 mg/dL.  For medical purposes only. Performed at [facility], [address] [place], [place] [address]   . Salicylate Lvl 03/20/2020 <7.0* 7.0 - 30.0 mg/dL Final   Performed at [facility], [address] [place], [place] [address]  . Total CK 03/20/2020 401* 49.0 - 397.0 U/L Final   Performed at [facility], [address] [place], [place] [address]  Office Visit on 02/13/2020  Component Date Value Ref Range Status  . CD4 T Cell Abs 02/13/2020 429  400 - 1,790 /uL Final  . CD4 % Helper T Cell 02/13/2020 33  33 - 65 % Final   Performed at [facility], [address], [place], [place] [address]  . HIV 1 RNA Quant 02/13/2020 <20  Copies/mL Final   Comment: HIV-1 RNA Not Detected .   [name] HIV-1 RNA Quant, Log 02/13/2020 <1.30  Log cps/mL Final   Comment: HIV-1 RNA Not Detected . Reference Range:                            Not Detected     copies/mL                           Not Detected Log copies/mL . [name] The test was performed using Real-Time Polymerase Chain Reaction. . . Reportable Range: 20 copies/mL to 10,000,000 copies/mL (1.30 Log copies/mL to 7.00 Log copies/mL). .   . Glucose, Bld 02/13/2020 84  65 - 99 mg/dL Final   Comment: .            Fasting reference interval .   . BUN 02/13/2020 18  7 - 25 mg/dL Final  . Creat 53/66/4403 1.32  0.60 - 1.35 mg/dL Final  . GFR, Est Non African American 02/13/2020 67  > OR = 60 mL/min/1.22m2 Final  . GFR, Est African American 02/13/2020 78  > OR =  60 mL/min/1.35m2 Final  . BUN/Creatinine Ratio 02/13/2020 NOT APPLICABLE  6 - 22 (calc) Final  . Sodium 02/13/2020 143  135 - 146 mmol/L Final  . Potassium 02/13/2020 4.6  3.5 - 5.3 mmol/L Final  . Chloride 02/13/2020 105  98 - 110 mmol/L Final  . CO2 02/13/2020 31  20 - 32 mmol/L Final  . Calcium 02/13/2020 9.9  8.6 - 10.3 mg/dL Final  . Total Protein 02/13/2020 6.9  6.1 - 8.1 g/dL Final  . Albumin 16/04/9603 4.2  3.6 - 5.1 g/dL Final  . Globulin 54/03/8118 2.7  1.9 - 3.7 g/dL (calc) Final  . AG Ratio 02/13/2020 1.6  1.0 - 2.5 (calc) Final  . Total Bilirubin 02/13/2020 0.6  0.2 - 1.2 mg/dL Final  . Alkaline phosphatase (APISO) 02/13/2020 100  36 - 130 U/L Final  . AST 02/13/2020 14  10 - 40 U/L Final  . ALT 02/13/2020 17  9 - 46 U/L Final  . WBC 02/13/2020 5.8  3.8 - 10.8 Thousand/uL Final  . RBC 02/13/2020 4.96  4.20 - 5.80 Million/uL Final  . Hemoglobin 02/13/2020 16.0  13.2 - 17.1 g/dL Final  . HCT 14/78/2956 47.0  38 - 50 % Final  . MCV 02/13/2020 94.8  80.0 - 100.0 fL Final  . MCH 02/13/2020 32.3  27.0 - 33.0 pg Final  . MCHC 02/13/2020 34.0  32.0 - 36.0 g/dL Final  . RDW 21/30/8657 11.8  11.0 - 15.0 % Final  . Platelets 02/13/2020 243  140 - 400 Thousand/uL Final  . MPV 02/13/2020 9.9  7.5 - 12.5 fL Final  . Neutro Abs 02/13/2020 2,964  1,500 - 7,800 cells/uL Final  . Lymphs Abs 02/13/2020  2,192  850 - 3,900 cells/uL Final  . Absolute Monocytes 02/13/2020 510  200 - 950 cells/uL Final  . Eosinophils Absolute 02/13/2020 93  15.0 - 500.0 cells/uL Final  . Basophils Absolute 02/13/2020 41  0.0 - 200.0 cells/uL Final  . Neutrophils Relative % 02/13/2020 51.1  % Final  . Total Lymphocyte 02/13/2020 37.8  % Final  . Monocytes Relative 02/13/2020 8.8  % Final  . Eosinophils Relative 02/13/2020 1.6  % Final  . Basophils Relative 02/13/2020 0.7  % Final  . Cholesterol 02/13/2020 152  <200 mg/dL Final  . HDL 84/69/6295 52  > OR = 40 mg/dL Final  . Triglycerides 02/13/2020 100  <150 mg/dL Final  . LDL Cholesterol (Calc) 02/13/2020 80  mg/dL (calc) Final   Comment: Reference range: <100 . Desirable range <100 mg/dL for primary prevention;   <70 mg/dL for patients with CHD or diabetic patients  with > or = 2 CHD risk factors. [name] LDL-C is now calculated using the Martin-Hopkins  calculation, which is a validated novel method providing  better accuracy than the Friedewald equation in the  estimation of LDL-C.  Horald Pollen et al. Lenox Ahr. 2841;324(40): 2061-2068  (http://education.[employer].com/faq/FAQ164)   . Total CHOL/HDL Ratio 02/13/2020 2.9  <1.0 (calc) Final  . Non-HDL Cholesterol (Calc) 02/13/2020 100  <130 mg/dL (calc) Final   Comment: For patients with diabetes plus 1 major ASCVD risk  factor, treating to a non-HDL-C goal of <100 mg/dL  (LDL-C of <27 mg/dL) is considered a therapeutic  option.   . RPR Ser Ql 02/13/2020 REACTIVE* NON-REACTI Final  . Neisseria Gonorrhea 02/13/2020 Negative   Final  . Chlamydia 02/13/2020 Negative   Final  . Comment 02/13/2020 Normal Reference Ranger Chlamydia - Negative   Final  . Comment 02/13/2020 Normal Reference  Range Neisseria Gonorrhea - Negative   Final  . RPR Titer 02/13/2020 1:1*  Final  . Fluorescent Treponemal ABS 02/13/2020 REACTIVE* NON-REACTI Final  Admission on 01/07/2020, Discharged on 01/08/2020  Component Date Value  Ref Range Status  . SARS Coronavirus 2 01/08/2020 NEGATIVE  NEGATIVE Final   Comment: (NOTE) SARS-CoV-2 target nucleic acids are NOT DETECTED.  The SARS-CoV-2 RNA is generally detectable in upper and lower respiratory specimens during the acute phase of infection. The lowest concentration of SARS-CoV-2 viral copies this assay can detect is 250 copies / mL. A negative result does not preclude SARS-CoV-2 infection and should not be used as the sole basis for treatment or other patient management decisions.  A negative result may occur with improper specimen collection / handling, submission of specimen other than nasopharyngeal swab, presence of viral mutation(s) within the areas targeted by this assay, and inadequate number of viral copies (<250 copies / mL). A negative result must be combined with clinical observations, patient history, and epidemiological information.  Fact Sheet for Patients:   BoilerBrush.com.cy  Fact Sheet for Healthcare Providers: https://pope.com/  This test is not yet approved or                           cleared by the [place] FDA and has been authorized for detection and/or diagnosis of SARS-CoV-2 by FDA under an Emergency Use Authorization (EUA).  This EUA will remain in effect (meaning this test can be used) for the duration of the COVID-19 declaration under Section 564(b)(1) of the Act, 21 U.S.C. section 360bbb-3(b)(1), unless the authorization is terminated or revoked sooner.  Performed at [facility], [address], [place], [place] [address]   . Sodium 01/07/2020 142  135 - 145 mmol/L Final  . Potassium 01/07/2020 3.4* 3.5 - 5.1 mmol/L Final  . Chloride 01/07/2020 107  98 - 111 mmol/L Final  . CO2 01/07/2020 25  22 - 32 mmol/L Final  . Glucose, Bld 01/07/2020 114* 70 - 99 mg/dL Final   Glucose reference range applies only to samples taken after fasting for at least 8 hours.  . BUN  01/07/2020 5* 6 - 20 mg/dL Final  . Creatinine, Ser 01/07/2020 1.06  0.61 - 1.24 mg/dL Final  . Calcium 86/57/8469 9.2  8.9 - 10.3 mg/dL Final  . Total Protein 01/07/2020 7.1  6.5 - 8.1 g/dL Final  . Albumin 62/95/2841 4.0  3.5 - 5.0 g/dL Final  . AST 32/44/0102 35  15 - 41 U/L Final  . ALT 01/07/2020 23  0 - 44 U/L Final  . Alkaline Phosphatase 01/07/2020 77  38 - 126 U/L Final  . Total Bilirubin 01/07/2020 1.5* 0.3 - 1.2 mg/dL Final  . GFR calc non Af Amer 01/07/2020 >60  >60 mL/min Final  . GFR calc Af Amer 01/07/2020 >60  >60 mL/min Final  . Anion gap 01/07/2020 10  5 - 15 Final   Performed at [facility], [address], [place], [place] [address]  . Alcohol, Ethyl (B) 01/07/2020 <10  <10 mg/dL Final   Comment: (NOTE) Lowest detectable limit for serum alcohol is 10 mg/dL.  For medical purposes only. Performed at [facility], [address] [name], [place] [address]   . Opiates 01/08/2020 NONE DETECTED  NONE DETECTED Final  . Cocaine 01/08/2020 NONE DETECTED  NONE DETECTED Final  . Benzodiazepines 01/08/2020 NONE DETECTED  NONE DETECTED Final  . Amphetamines  01/08/2020 POSITIVE* NONE DETECTED Final  . Tetrahydrocannabinol 01/08/2020 POSITIVE* NONE DETECTED Final  . Barbiturates 01/08/2020 NONE DETECTED  NONE DETECTED Final   Comment: (NOTE) DRUG SCREEN FOR MEDICAL PURPOSES ONLY.  IF CONFIRMATION IS NEEDED FOR ANY PURPOSE, NOTIFY LAB WITHIN 5 DAYS.  LOWEST DETECTABLE LIMITS FOR URINE DRUG SCREEN Drug Class                     Cutoff (ng/mL) Amphetamine and metabolites    1000 Barbiturate and metabolites    200 Benzodiazepine                 200 Tricyclics and metabolites     300 Opiates and metabolites        300 Cocaine and metabolites        300 THC                            50 Performed at [facility], [address] [place], [place] [address]   . WBC 01/07/2020 4.9  4.0 - 10.5 K/uL Final  . RBC 01/07/2020 4.44  4.22 - 5.81 MIL/uL Final   . Hemoglobin 01/07/2020 14.3  13.0 - 17.0 g/dL Final  . HCT 60/45/409807/12/2019 43.0  39 - 52 % Final  . MCV 01/07/2020 96.8  80.0 - 100.0 fL Final  . MCH 01/07/2020 32.2  26.0 - 34.0 pg Final  . MCHC 01/07/2020 33.3  30.0 - 36.0 g/dL Final  . RDW 11/91/478207/12/2019 11.8  11.5 - 15.5 % Final  . Platelets 01/07/2020 188  150 - 400 K/uL Final  . nRBC 01/07/2020 0.0  0.0 - 0.2 % Final  . Neutrophils Relative % 01/07/2020 54  % Final  . Neutro Abs 01/07/2020 2.6  1.7 - 7.7 K/uL Final  . Lymphocytes Relative 01/07/2020 36  % Final  . Lymphs Abs 01/07/2020 1.8  0.7 - 4.0 K/uL Final  . Monocytes Relative 01/07/2020 10  % Final  . Monocytes Absolute 01/07/2020 0.5  0.1 - 1.0 K/uL Final  . Eosinophils Relative 01/07/2020 0  % Final  . Eosinophils Absolute 01/07/2020 0.0  0.0 - 0.5 K/uL Final  . Basophils Relative 01/07/2020 0  % Final  . Basophils Absolute 01/07/2020 0.0  0.0 - 0.1 K/uL Final  . Immature Granulocytes 01/07/2020 0  % Final  . Abs Immature Granulocytes 01/07/2020 0.01  0.00 - 0.07 K/uL Final   Performed at [facility], [address] [place], [place] [address]  . Acetaminophen (Tylenol), Serum 01/07/2020 <10* 10 - 30 ug/mL Final   Comment: (NOTE) Therapeutic concentrations vary significantly. A range of 10-30 ug/mL  may be an effective concentration for many patients. However, some  are best treated at concentrations outside of this range. Acetaminophen concentrations >150 ug/mL at 4 hours after ingestion  and >50 ug/mL at 12 hours after ingestion are often associated with  toxic reactions.  Performed at [facility], [address] [place], [place] [address]   . Salicylate Lvl 01/07/2020 <7.0* 7.0 - 30.0 mg/dL Final   Performed at [facility], [address] [place], [place] [address]  Admission on 01/04/2020, Discharged on 01/05/2020  Component Date Value Ref Range Status  . Sodium 01/04/2020 140  135 - 145 mmol/L Final  . Potassium 01/04/2020 3.7  3.5 - 5.1  mmol/L Final  . Chloride 01/04/2020 101  98 - 111 mmol/L Final  . CO2  01/04/2020 21* 22 - 32 mmol/L Final  . Glucose, Bld 01/04/2020 132* 70 - 99 mg/dL Final   Glucose reference range applies only to samples taken after fasting for at least 8 hours.  . BUN 01/04/2020 10  6 - 20 mg/dL Final  . Creatinine, Ser 01/04/2020 2.12* 0.61 - 1.24 mg/dL Final  . Calcium 16/04/9603 10.3  8.9 - 10.3 mg/dL Final  . Total Protein 01/04/2020 8.9* 6.5 - 8.1 g/dL Final  . Albumin 54/03/8118 5.0  3.5 - 5.0 g/dL Final  . AST 14/78/2956 50* 15 - 41 U/L Final  . ALT 01/04/2020 31  0 - 44 U/L Final  . Alkaline Phosphatase 01/04/2020 100  38 - 126 U/L Final  . Total Bilirubin 01/04/2020 2.1* 0.3 - 1.2 mg/dL Final  . GFR calc non Af Amer 01/04/2020 38* >60 mL/min Final  . GFR calc Af Amer 01/04/2020 44* >60 mL/min Final  . Anion gap 01/04/2020 18* 5 - 15 Final   Performed at [facility], [address], [place], [place] [address]  . Alcohol, Ethyl (B) 01/04/2020 <10  <10 mg/dL Final   Comment: (NOTE) Lowest detectable limit for serum alcohol is 10 mg/dL.  For medical purposes only. Performed at [facility], [address] [place], [place] [address]   . Salicylate Lvl 01/04/2020 <7.0* 7.0 - 30.0 mg/dL Final   Performed at [facility], [address] [place], [place] [address]  . Acetaminophen (Tylenol), Serum 01/04/2020 <10* 10 - 30 ug/mL Final   Comment: (NOTE) Therapeutic concentrations vary significantly. A range of 10-30 ug/mL  may be an effective concentration for many patients. However, some  are best treated at concentrations outside of this range. Acetaminophen concentrations >150 ug/mL at 4 hours after ingestion  and >50 ug/mL at 12 hours after ingestion are often associated with  toxic reactions.  Performed at [facility], [address], [place], [place] [address]   . WBC 01/04/2020 9.0  4.0 - 10.5 K/uL Final  . RBC 01/04/2020 5.27  4.22 - 5.81 MIL/uL Final  .  Hemoglobin 01/04/2020 16.7  13.0 - 17.0 g/dL Final  . HCT 32/44/0102 50.6  39 - 52 % Final  . MCV 01/04/2020 96.0  80.0 - 100.0 fL Final  . MCH 01/04/2020 31.7  26.0 - 34.0 pg Final  . MCHC 01/04/2020 33.0  30.0 - 36.0 g/dL Final  . RDW 72/53/6644 11.8  11.5 - 15.5 % Final  . Platelets 01/04/2020 176  150 - 400 K/uL Final  . nRBC 01/04/2020 0.0  0.0 - 0.2 % Final   Performed at [facility], [address], [place], [place] [address]  . Opiates 01/04/2020 NONE DETECTED  NONE DETECTED Final  . Cocaine 01/04/2020 NONE DETECTED  NONE DETECTED Final  . Benzodiazepines 01/04/2020 NONE DETECTED  NONE DETECTED Final  . Amphetamines 01/04/2020 POSITIVE* NONE DETECTED Final  . Tetrahydrocannabinol 01/04/2020 NONE DETECTED  NONE DETECTED Final  . Barbiturates 01/04/2020 NONE DETECTED  NONE DETECTED Final   Comment: (NOTE) DRUG SCREEN FOR MEDICAL PURPOSES ONLY.  IF CONFIRMATION IS NEEDED FOR ANY PURPOSE, NOTIFY LAB WITHIN 5 DAYS.  LOWEST DETECTABLE LIMITS FOR URINE DRUG SCREEN Drug Class                     Cutoff (ng/mL) Amphetamine and metabolites    1000 Barbiturate and metabolites    200 Benzodiazepine  200 Tricyclics and metabolites     300 Opiates and metabolites        300 Cocaine and metabolites        300 THC                            50 Performed at [facility], [address] [place], [place] [address]   . Sodium 01/05/2020 141  135 - 145 mmol/L Final  . Potassium 01/05/2020 3.1* 3.5 - 5.1 mmol/L Final  . Chloride 01/05/2020 116* 98 - 111 mmol/L Final  . CO2 01/05/2020 17* 22 - 32 mmol/L Final  . Glucose, Bld 01/05/2020 75  70 - 99 mg/dL Final   Glucose reference range applies only to samples taken after fasting for at least 8 hours.  . BUN 01/05/2020 9  6 - 20 mg/dL Final  . Creatinine, Ser 01/05/2020 0.96  0.61 - 1.24 mg/dL Final   DELTA CHECK NOTED  . Calcium 01/05/2020 6.9* 8.9 - 10.3 mg/dL Final   DELTA CHECK NOTED  . GFR calc non Af  Amer 01/05/2020 >60  >60 mL/min Final  . GFR calc Af Amer 01/05/2020 >60  >60 mL/min Final  . Anion gap 01/05/2020 8  5 - 15 Final   Performed at [facility], [address] [place], [place] [address]  Admission on 01/02/2020, Discharged on 01/03/2020  Component Date Value Ref Range Status  . TSH 01/02/2020 0.942  0.350 - 4.500 uIU/mL Final   Comment: Performed by a 3rd Generation assay with a functional sensitivity of <=0.01 uIU/mL. Performed at [facility], [address] [place], [place] [address]   . POC Amphetamine UR 01/02/2020 None Detected  None Detected Final  . POC Secobarbital (BAR) 01/02/2020 None Detected  None Detected Final  . POC Buprenorphine (BUP) 01/02/2020 None Detected  None Detected Final  . POC Oxazepam (BZO) 01/02/2020 None Detected  None Detected Final  . POC Cocaine UR 01/02/2020 None Detected  None Detected Final  . POC Methamphetamine UR 01/02/2020 None Detected  None Detected Final  . POC Morphine 01/02/2020 None Detected  None Detected Final  . POC Oxycodone UR 01/02/2020 None Detected  None Detected Final  . POC Methadone UR 01/02/2020 None Detected  None Detected Final  . POC Marijuana UR 01/02/2020 None Detected  None Detected Final  Admission on 01/01/2020, Discharged on 01/02/2020  Component Date Value Ref Range Status  . Glucose-Capillary 01/01/2020 116* 70 - 99 mg/dL Final   Glucose reference range applies only to samples taken after fasting for at least 8 hours.  . Sodium 01/02/2020 141  135 - 145 mmol/L Final  . Potassium 01/02/2020 3.8  3.5 - 5.1 mmol/L Final  . Chloride 01/02/2020 102  98 - 111 mmol/L Final  . CO2 01/02/2020 26  22 - 32 mmol/L Final  . Glucose, Bld 01/02/2020 98  70 - 99 mg/dL Final   Glucose reference range applies only to samples taken after fasting for at least 8 hours.  . BUN 01/02/2020 9  6 - 20 mg/dL Final  . Creatinine, Ser 01/02/2020 1.17  0.61 - 1.24 mg/dL Final  . Calcium 91/47/8295 9.1  8.9 - 10.3  mg/dL Final  . GFR calc non Af Amer 01/02/2020 >60  >60 mL/min Final  . GFR calc Af Amer 01/02/2020 >60  >60 mL/min Final  . Anion gap 01/02/2020 13  5 - 15 Final  Performed at [facility], [address], [place], [place] [address]  . WBC 01/02/2020 3.7* 4.0 - 10.5 K/uL Final  . RBC 01/02/2020 4.76  4.22 - 5.81 MIL/uL Final  . Hemoglobin 01/02/2020 15.6  13.0 - 17.0 g/dL Final  . HCT 60/45/4098 46.1  39 - 52 % Final  . MCV 01/02/2020 96.8  80.0 - 100.0 fL Final  . MCH 01/02/2020 32.8  26.0 - 34.0 pg Final  . MCHC 01/02/2020 33.8  30.0 - 36.0 g/dL Final  . RDW 11/91/4782 11.8  11.5 - 15.5 % Final  . Platelets 01/02/2020 112* 150 - 400 K/uL Final   Comment: SPECIMEN CHECKED FOR CLOTS Immature Platelet Fraction may be clinically indicated, consider ordering this additional test [id number] PLATELET COUNT CONFIRMED BY SMEAR REPEATED TO VERIFY   . nRBC 01/02/2020 0.0  0.0 - 0.2 % Final  . Neutrophils Relative % 01/02/2020 46  % Final  . Neutro Abs 01/02/2020 1.7  1.7 - 7.7 K/uL Final  . Lymphocytes Relative 01/02/2020 41  % Final  . Lymphs Abs 01/02/2020 1.5  0.7 - 4.0 K/uL Final  . Monocytes Relative 01/02/2020 13  % Final  . Monocytes Absolute 01/02/2020 0.5  0.1 - 1.0 K/uL Final  . Eosinophils Relative 01/02/2020 0  % Final  . Eosinophils Absolute 01/02/2020 0.0  0.0 - 0.5 K/uL Final  . Basophils Relative 01/02/2020 0  % Final  . Basophils Absolute 01/02/2020 0.0  0.0 - 0.1 K/uL Final  . Immature Granulocytes 01/02/2020 0  % Final  . Abs Immature Granulocytes 01/02/2020 0.01  0.00 - 0.07 K/uL Final   Performed at [facility], [address], [place], [place] [address]  . SARS Coronavirus 2 01/02/2020 NEGATIVE  NEGATIVE Final   Comment: (NOTE) SARS-CoV-2 target nucleic acids are NOT DETECTED.  The SARS-CoV-2 RNA is generally detectable in upper and lower respiratory specimens during the acute phase of infection. The lowest concentration of  SARS-CoV-2 viral copies this assay can detect is 250 copies / mL. A negative result does not preclude SARS-CoV-2 infection and should not be used as the sole basis for treatment or other patient management decisions.  A negative result may occur with improper specimen collection / handling, submission of specimen other than nasopharyngeal swab, presence of viral mutation(s) within the areas targeted by this assay, and inadequate number of viral copies (<250 copies / mL). A negative result must be combined with clinical observations, patient history, and epidemiological information.  Fact Sheet for Patients:   BoilerBrush.com.cy  Fact Sheet for Healthcare Providers: https://pope.com/  This test is not yet approved or                           cleared by the [place] FDA and has been authorized for detection and/or diagnosis of SARS-CoV-2 by FDA under an Emergency Use Authorization (EUA).  This EUA will remain in effect (meaning this test can be used) for the duration of the COVID-19 declaration under Section 564(b)(1) of the Act, 21 U.S.C. section 360bbb-3(b)(1), unless the authorization is terminated or revoked sooner.  Performed at [facility], [address], [place], [place] [address]   . Glucose-Capillary 01/02/2020 88  70 - 99 mg/dL Final   Glucose reference range applies only to samples taken after fasting for at least 8 hours.  Admission on 11/29/2019, Discharged on 12/06/2019  Component Date Value Ref Range Status  . Sodium 11/29/2019  142  135 - 145 mmol/L Final  . Potassium 11/29/2019 4.3  3.5 - 5.1 mmol/L Final  . Chloride 11/29/2019 107  98 - 111 mmol/L Final  . CO2 11/29/2019 26  22 - 32 mmol/L Final  . Glucose, Bld 11/29/2019 99  70 - 99 mg/dL Final   Glucose reference range applies only to samples taken after fasting for at least 8 hours.  . BUN 11/29/2019 8  6 - 20 mg/dL Final  . Creatinine,  Ser 11/29/2019 0.99  0.61 - 1.24 mg/dL Final  . Calcium 16/04/9603 9.1  8.9 - 10.3 mg/dL Final  . Total Protein 11/29/2019 6.7  6.5 - 8.1 g/dL Final  . Albumin 54/03/8118 4.0  3.5 - 5.0 g/dL Final  . AST 14/78/2956 25  15 - 41 U/L Final  . ALT 11/29/2019 21  0 - 44 U/L Final  . Alkaline Phosphatase 11/29/2019 83  38 - 126 U/L Final  . Total Bilirubin 11/29/2019 0.9  0.3 - 1.2 mg/dL Final  . GFR calc non Af Amer 11/29/2019 >60  >60 mL/min Final  . GFR calc Af Amer 11/29/2019 >60  >60 mL/min Final  . Anion gap 11/29/2019 9  5 - 15 Final   Performed at [facility], [address], [place], [place] [address]  . Alcohol, Ethyl (B) 11/29/2019 <10  <10 mg/dL Final   Comment: (NOTE) Lowest detectable limit for serum alcohol is 10 mg/dL. For medical purposes only. Performed at [facility], [address], [place], [place] [address]   . Salicylate Lvl 11/29/2019 <7.0* 7.0 - 30.0 mg/dL Final   Performed at [facility], [address] [place], [place] [address]  . Acetaminophen (Tylenol), Serum 11/29/2019 <10* 10 - 30 ug/mL Final   Comment: (NOTE) Therapeutic concentrations vary significantly. A range of 10-30 ug/mL  may be an effective concentration for many patients. However, some  are best treated at concentrations outside of this range. Acetaminophen concentrations >150 ug/mL at 4 hours after ingestion  and >50 ug/mL at 12 hours after ingestion are often associated with  toxic reactions. Performed at [facility], [address] [place], [place] [address]   . WBC 11/29/2019 5.9  4.0 - 10.5 K/uL Final  . RBC 11/29/2019 4.38  4.22 - 5.81 MIL/uL Final  . Hemoglobin 11/29/2019 14.2  13.0 - 17.0 g/dL Final  . HCT 32/44/0102 44.0  39 - 52 % Final  . MCV 11/29/2019 100.5* 80.0 - 100.0 fL Final  . MCH 11/29/2019 32.4  26.0 - 34.0 pg Final  . MCHC 11/29/2019 32.3  30.0 - 36.0 g/dL Final  . RDW 72/53/6644 13.0  11.5 - 15.5 % Final  . Platelets 11/29/2019 228  150 -  400 K/uL Final  . nRBC 11/29/2019 0.0  0.0 - 0.2 % Final   Performed at [facility], [address] [place], [place] [address]  . Opiates 11/29/2019 NONE DETECTED  NONE DETECTED Final  . Cocaine 11/29/2019 NONE DETECTED  NONE DETECTED Final  . Benzodiazepines 11/29/2019 NONE DETECTED  NONE DETECTED Final  . Amphetamines 11/29/2019 NONE DETECTED  NONE DETECTED Final  . Tetrahydrocannabinol 11/29/2019 POSITIVE* NONE DETECTED Final  . Barbiturates 11/29/2019 NONE DETECTED  NONE DETECTED Final   Comment: (NOTE) DRUG SCREEN FOR MEDICAL PURPOSES ONLY.  IF CONFIRMATION IS NEEDED FOR ANY PURPOSE, NOTIFY LAB WITHIN 5 DAYS. LOWEST DETECTABLE LIMITS FOR URINE DRUG SCREEN Drug Class  Cutoff (ng/mL) Amphetamine and metabolites    1000 Barbiturate and metabolites    200 Benzodiazepine                 200 Tricyclics and metabolites     300 Opiates and metabolites        300 Cocaine and metabolites        300 THC                            50 Performed at [facility], [address], [place], [place] [address]   . SARS Coronavirus 2 11/30/2019 NEGATIVE  NEGATIVE Final   Comment: (NOTE) SARS-CoV-2 target nucleic acids are NOT DETECTED. The SARS-CoV-2 RNA is generally detectable in upper and lower respiratory specimens during the acute phase of infection. The lowest concentration of SARS-CoV-2 viral copies this assay can detect is 250 copies / mL. A negative result does not preclude SARS-CoV-2 infection and should not be used as the sole basis for treatment or other patient management decisions.  A negative result may occur with improper specimen collection / handling, submission of specimen other than nasopharyngeal swab, presence of viral mutation(s) within the areas targeted by this assay, and inadequate number of viral copies (<250 copies / mL). A negative result must be combined with clinical observations, patient history, and epidemiological  information. Fact Sheet for Patients:   BoilerBrush.com.cy Fact Sheet for Healthcare Providers: https://pope.com/ This test is not yet approved or cleared                           by the [place] FDA and has been authorized for detection and/or diagnosis of SARS-CoV-2 by FDA under an Emergency Use Authorization (EUA).  This EUA will remain in effect (meaning this test can be used) for the duration of the COVID-19 declaration under Section 564(b)(1) of the Act, 21 U.S.C. section 360bbb-3(b)(1), unless the authorization is terminated or revoked sooner. Performed at [facility], [address] [place], [place] [address]   There may be more visits with results that are not included.    Allergies: Patient has no known allergies.  PTA Medications: (Not in a hospital admission)   Medical Decision Making  COVID and labs have been ordered Patient be restarted on his home medications milligrams p.o. daily, Prozac 20 mg p.o. daily, Keppra 500 mg p.o. twice daily, and Biktarvy    Recommendations  Based on my evaluation the patient does not appear to have an emergency medical condition.  Gerlene Burdock Tyshika Baldridge, FNP 04/18/20  11:09 AM

## 2020-04-18 NOTE — ED Notes (Signed)
At around 1400 pt was redirected off the nurses station. At around 1406, the patient climbed over the nurses station. Redirection was not successful as he jumped over the nurses station, through the unit, over to the child adolescent side, and back into the nurses station. Patient was guided back into the adult unit by staff after instruction by Reola Calkins, NP. Pt sitting quietly deciding on whether to take medication voluntarily or not.

## 2020-04-18 NOTE — ED Notes (Signed)
Pt sleeping at present, no distress noted, monitoring for safety. 

## 2020-04-18 NOTE — ED Notes (Signed)
Misty Stanley, MHT witnessed pt inspecting laundry hamper and its connection points. Hamper was removed from unit for safety measures.

## 2020-04-19 MED ORDER — BIKTARVY 50-200-25 MG PO TABS: 1.0000 | ORAL_TABLET | Freq: Every day | ORAL | Status: DC

## 2020-04-19 NOTE — Discharge Instructions (Addendum)
Take all medications as prescribed. Keep all follow-up appointments as scheduled.  Do not consume alcohol or use illegal drugs while on prescription medications. Report any adverse effects from your medications to your primary care provider promptly.  In the event of recurrent symptoms or worsening symptoms, call 911, a crisis hotline, or go to the nearest emergency department for evaluation.   

## 2020-04-19 NOTE — ED Provider Notes (Signed)
FBC/OBS ASAP Discharge Summary  Date and Time: 04/19/2020 10:05 AM  Name: Justin [name]  MRN:  [id number]   Discharge Diagnoses:  Final diagnoses:  Methamphetamine-induced mood disorder (HCC)   HPI: Per admission assessment note: Patient is a 39 year old male who presented to the [facility] with [facility].  [facility] reports that the patient contacted them reporting that he was having hallucinations and wanted to get help before things got worse.  Patient reports that his hallucinations have begun a little bit worse.  He states that the voices are telling him that he has been doing some bad things and that his family is dead.  He states he knows that these are not true but they will not stop.  He states that the voices are so severe that he has had thoughts about killing himself or harming other people because he thinks that he is or the actual people around him that are telling him these things.  Patient reports compliance with his medications but then denies following up with outpatient psychiatric resources.  Patient states that he just wants to get stable and tenuous medications.  Patient does admit to using some methamphetamines recently and states that it was a small amount but does not report exactly when he last used these drugs  Per RN assessment note:Pt transported to [facility] in [place] via [employer]. Pt denies SI/HI, A/VH, pain, or any concerns at this time  Total Time spent with patient: 15 minutes  Past Psychiatric History:  Past Medical History:  Past Medical History:  Diagnosis Date  . Endocarditis   . Hepatitis C   . History of syphilis 08/25/2014   Treated by [facility] 07-2014.  [name] HIV (human immunodeficiency virus infection) (HCC)   . Seizures (HCC)     Past Surgical History:  Procedure Laterality Date  . hand surgery Right   . LUMBAR PUNCTURE  08/08/2019      . WRIST SURGERY     Family History:  Family History  Adopted: Yes  Family history unknown: Yes   Family  Psychiatric History:  Social History:  Social History   Substance and Sexual Activity  Alcohol Use Yes   Comment: 1-2 a week      Social History   Substance and Sexual Activity  Drug Use Yes  . Frequency: 14.0 times per week  . Types: Marijuana, Methamphetamines    Social History   Socioeconomic History  . Marital status: Widowed    Spouse name: Not on file  . Number of children: Not on file  . Years of education: Not on file  . Highest education level: Not on file  Occupational History  . Not on file  Tobacco Use  . Smoking status: Current Every Day Smoker    Types: Cigarettes  . Smokeless tobacco: Never Used  Vaping Use  . Vaping Use: Never used  Substance and Sexual Activity  . Alcohol use: Yes    Comment: 1-2 a week   . Drug use: Yes    Frequency: 14.0 times per week    Types: Marijuana, Methamphetamines  . Sexual activity: Yes    Partners: Male    Birth control/protection: Condom    Comment: given condoms  Other Topics Concern  . Not on file  Social History Narrative   ** Merged History Encounter **       ** Merged History Encounter **       ** Merged History Encounter **  Social Determinants of Health   Financial Resource Strain:   . Difficulty of Paying Living Expenses: Not on file  Food Insecurity:   . Worried About Programme researcher, broadcasting/film/video in the Last Year: Not on file  . Ran Out of Food in the Last Year: Not on file  Transportation Needs:   . Lack of Transportation (Medical): Not on file  . Lack of Transportation (Non-Medical): Not on file  Physical Activity:   . Days of Exercise per Week: Not on file  . Minutes of Exercise per Session: Not on file  Stress:   . Feeling of Stress : Not on file  Social Connections:   . Frequency of Communication with Friends and Family: Not on file  . Frequency of Social Gatherings with Friends and Family: Not on file  . Attends Religious Services: Not on file  . Active Member of Clubs or Organizations:  Not on file  . Attends Banker Meetings: Not on file  . Marital Status: Not on file   SDOH:  SDOH Screenings   Alcohol Screen:   . Last Alcohol Screening Score (AUDIT): Not on file  Depression (PHQ2-9): Medium Risk  . PHQ-2 Score: 16  Financial Resource Strain:   . Difficulty of Paying Living Expenses: Not on file  Food Insecurity:   . Worried About Programme researcher, broadcasting/film/video in the Last Year: Not on file  . Ran Out of Food in the Last Year: Not on file  Housing:   . Last Housing Risk Score: Not on file  Physical Activity:   . Days of Exercise per Week: Not on file  . Minutes of Exercise per Session: Not on file  Social Connections:   . Frequency of Communication with Friends and Family: Not on file  . Frequency of Social Gatherings with Friends and Family: Not on file  . Attends Religious Services: Not on file  . Active Member of Clubs or Organizations: Not on file  . Attends Banker Meetings: Not on file  . Marital Status: Not on file  Stress:   . Feeling of Stress : Not on file  Tobacco Use: High Risk  . Smoking Tobacco Use: Current Every Day Smoker  . Smokeless Tobacco Use: Never Used  Transportation Needs:   . Freight forwarder (Medical): Not on file  . Lack of Transportation (Non-Medical): Not on file    Has this patient used any form of tobacco in the last 30 days? (Cigarettes, Smokeless Tobacco, Cigars, and/or Pipes) A prescription for an FDA-approved tobacco cessation medication was offered at discharge and the patient refused  Current Medications:  Current Facility-Administered Medications  Medication Dose Route Frequency Provider Last Rate Last Admin  . acetaminophen (TYLENOL) tablet 650 mg  650 mg Oral Q6H PRN Money, Gerlene Burdock, FNP      . alum & mag hydroxide-simeth (MAALOX/MYLANTA) 200-200-20 MG/5ML suspension 30 mL  30 mL Oral Q4H PRN Money, Gerlene Burdock, FNP      . ARIPiprazole (ABILIFY) tablet 5 mg  5 mg Oral Daily Money, Gerlene Burdock, FNP    5 mg at 04/19/20 0856  . bictegravir-emtricitabine-tenofovir AF (BIKTARVY) 50-200-25 MG per tablet 1 tablet  1 tablet Oral Daily Money, Gerlene Burdock, FNP   1 tablet at 04/19/20 0856  . FLUoxetine (PROZAC) capsule 20 mg  20 mg Oral Daily Money, Gerlene Burdock, FNP   20 mg at 04/19/20 0856  . levETIRAcetam (KEPPRA) tablet 500 mg  500 mg Oral BID Money, [name]  B, FNP   500 mg at 04/19/20 0856  . magnesium hydroxide (MILK OF MAGNESIA) suspension 30 mL  30 mL Oral Daily PRN Money, Gerlene Burdock, FNP       Current Outpatient Medications  Medication Sig Dispense Refill  . ARIPiprazole (ABILIFY) 5 MG tablet Take 1 tablet (5 mg total) by mouth daily. (Patient not taking: Reported on 02/13/2020) 30 tablet 0  . bictegravir-emtricitabine-tenofovir AF (BIKTARVY) 50-200-25 MG TABS tablet Take 1 tablet by mouth daily. (Patient not taking: Reported on 03/22/2020) 30 tablet 5  . [START ON 04/20/2020] bictegravir-emtricitabine-tenofovir AF (BIKTARVY) 50-200-25 MG TABS tablet Take 1 tablet by mouth daily. 30 tablet   . FLUoxetine (PROZAC) 20 MG capsule Take 20 mg by mouth daily. (Patient not taking: Reported on 04/11/2020)    . levETIRAcetam (KEPPRA) 500 MG tablet Take 1 tablet (500 mg total) by mouth 2 (two) times daily. (Patient not taking: Reported on 03/22/2020) 60 tablet 5    PTA Medications: (Not in a hospital admission)   Musculoskeletal    Psychiatric Specialty Exam  Presentation  General Appearance: Disheveled  Eye Contact:Fair  Speech:Clear and Coherent;Normal Rate  Speech Volume:Normal  Handedness:Right   Mood and Affect  Mood:Anxious  Affect:Constricted   Thought Process  Thought Processes:Coherent  Descriptions of Associations:Intact  Orientation:Full (Time, Place and Person)  Thought Content:Paranoid Ideation  Hallucinations:Hallucinations: None;Auditory Description of Auditory Hallucinations: Voices are telling my family is dead and bad things about himself  Ideas of  Reference:None  Suicidal Thoughts:Suicidal Thoughts: Yes, Passive SI Passive Intent and/or Plan: Without Intent;Without Plan  Homicidal Thoughts:Homicidal Thoughts: No   Sensorium  Memory:Immediate Good;Recent Good;Remote Good  Judgment:Fair  Insight:Fair   Executive Functions  Concentration:Good  Attention Span:Good  Recall:Good  Fund of Knowledge:Fair  Language:Good   Psychomotor Activity  Psychomotor Activity:Psychomotor Activity: Normal   Assets  Assets:Communication Skills;Desire for Improvement;Housing   Sleep  Sleep:Sleep: Fair   Physical Exam  Physical Exam Vitals reviewed.  Neurological:     Mental Status: He is alert.  Psychiatric:        Mood and Affect: Mood normal.    ROS Blood pressure 110/70, pulse 100, temperature 98 F (36.7 C), temperature source Oral, resp. rate 18, height 5\' 11"  (1.803 m), weight 220 lb (99.8 kg), SpO2 100 %. Body mass index is 30.68 kg/m.  Demographic Factors:  Male  Loss Factors: NA  Historical Factors: Family history of mental illness or substance abuse  Risk Reduction Factors:   NA  Continued Clinical Symptoms:  Alcohol/Substance Abuse/Dependencies  Cognitive Features That Contribute To Risk:  Closed-mindedness    Suicide Risk:  Minimal: No identifiable suicidal ideation.  Patients presenting with no risk factors but with morbid ruminations; may be classified as minimal risk based on the severity of the depressive symptoms  Plan Of Care/Follow-up recommendations:  Activity:  as tolorated Diet:  heart healthy  Disposition:  Patient was transported to [facility] in [facility], NP 04/19/2020, 10:05 AM

## 2020-04-19 NOTE — ED Notes (Signed)
Pt sleeping at present, no distress noted, monitoring for safety. 

## 2020-04-19 NOTE — ED Notes (Addendum)
Pt alert and oriented on the unit. Education, support, and encouragement provided. Discharge summary, medications and follow up appointments reviewed with pt. Suicide prevention resources provided. Pt's belongings in locker returned and belongings sheet signed. Pt transported to [facility] in [place] via [employer]. Pt denies SI/HI, A/VH, pain, or any concerns at this time. Pt ambulatory on and off unit. Pt discharged to lobby.

## 2020-04-20 LAB — HEMOGLOBIN A1C
Hgb A1c MFr Bld: 4.7 % — ABNORMAL LOW (ref 4.8–5.6)
Mean Plasma Glucose: 88 mg/dL

## 2020-04-22 ENCOUNTER — Encounter (HOSPITAL_COMMUNITY): Payer: Self-pay | Admitting: Emergency Medicine

## 2020-04-22 ENCOUNTER — Ambulatory Visit: Payer: Self-pay | Admitting: Neurology

## 2020-04-22 ENCOUNTER — Emergency Department (HOSPITAL_COMMUNITY)
Admission: EM | Admit: 2020-04-22 | Discharge: 2020-04-22 | Disposition: A | Discharge status: Home / Self Care | Payer: Self-pay | Attending: Emergency Medicine | Admitting: Emergency Medicine

## 2020-04-22 ENCOUNTER — Emergency Department (HOSPITAL_COMMUNITY): Payer: Self-pay

## 2020-04-22 ENCOUNTER — Other Ambulatory Visit: Payer: Self-pay

## 2020-04-22 DIAGNOSIS — R519 Headache, unspecified: Secondary | ICD-10-CM | POA: Insufficient documentation

## 2020-04-22 DIAGNOSIS — Z20822 Contact with and (suspected) exposure to covid-19: Secondary | ICD-10-CM | POA: Insufficient documentation

## 2020-04-22 DIAGNOSIS — R569 Unspecified convulsions: Secondary | ICD-10-CM | POA: Insufficient documentation

## 2020-04-22 DIAGNOSIS — F1514 Other stimulant abuse with stimulant-induced mood disorder: Secondary | ICD-10-CM | POA: Insufficient documentation

## 2020-04-22 DIAGNOSIS — M25552 Pain in left hip: Secondary | ICD-10-CM | POA: Insufficient documentation

## 2020-04-22 DIAGNOSIS — M25561 Pain in right knee: Secondary | ICD-10-CM | POA: Insufficient documentation

## 2020-04-22 DIAGNOSIS — F17210 Nicotine dependence, cigarettes, uncomplicated: Secondary | ICD-10-CM | POA: Insufficient documentation

## 2020-04-22 LAB — CBC WITH DIFFERENTIAL/PLATELET
Abs Immature Granulocytes: 0.02 10*3/uL (ref 0.00–0.07)
Basophils Absolute: 0 10*3/uL (ref 0.0–0.1)
Basophils Relative: 0 %
Eosinophils Absolute: 0 10*3/uL (ref 0.0–0.5)
Eosinophils Relative: 1 %
HCT: 49.9 % (ref 39.0–52.0)
Hemoglobin: 16.4 g/dL (ref 13.0–17.0)
Immature Granulocytes: 0 %
Lymphocytes Relative: 29 %
Lymphs Abs: 2.5 10*3/uL (ref 0.7–4.0)
MCH: 32.3 pg (ref 26.0–34.0)
MCHC: 32.9 g/dL (ref 30.0–36.0)
MCV: 98.4 fL (ref 80.0–100.0)
Monocytes Absolute: 0.7 10*3/uL (ref 0.1–1.0)
Monocytes Relative: 8 %
Neutro Abs: 5.2 10*3/uL (ref 1.7–7.7)
Neutrophils Relative %: 62 %
Platelets: 273 10*3/uL (ref 150–400)
RBC: 5.07 MIL/uL (ref 4.22–5.81)
RDW: 12 % (ref 11.5–15.5)
WBC: 8.4 10*3/uL (ref 4.0–10.5)
nRBC: 0 % (ref 0.0–0.2)

## 2020-04-22 LAB — HEPATITIS PANEL, ACUTE
HCV Ab: REACTIVE — AB
Hep A IgM: NONREACTIVE
Hep B C IgM: NONREACTIVE
Hepatitis B Surface Ag: NONREACTIVE

## 2020-04-22 LAB — COMPREHENSIVE METABOLIC PANEL
ALT: 32 U/L (ref 0–44)
AST: 33 U/L (ref 15–41)
Albumin: 5.1 g/dL — ABNORMAL HIGH (ref 3.5–5.0)
Alkaline Phosphatase: 97 U/L (ref 38–126)
Anion gap: 13 (ref 5–15)
BUN: 11 mg/dL (ref 6–20)
CO2: 25 mmol/L (ref 22–32)
Calcium: 9.6 mg/dL (ref 8.9–10.3)
Chloride: 102 mmol/L (ref 98–111)
Creatinine, Ser: 1.16 mg/dL (ref 0.61–1.24)
GFR, Estimated: >60 mL/min (ref >60)
Glucose, Bld: 81 mg/dL (ref 70–99)
Potassium: 3.8 mmol/L (ref 3.5–5.1)
Sodium: 140 mmol/L (ref 135–145)
Total Bilirubin: 2.1 mg/dL — ABNORMAL HIGH (ref 0.3–1.2)
Total Protein: 8.8 g/dL — ABNORMAL HIGH (ref 6.5–8.1)

## 2020-04-22 LAB — MAGNESIUM: Magnesium: 2.2 mg/dL (ref 1.7–2.4)

## 2020-04-22 LAB — RESPIRATORY PANEL BY RT PCR (FLU A&B, COVID)
Influenza A by PCR: NEGATIVE
Influenza B by PCR: NEGATIVE
SARS Coronavirus 2 by RT PCR: NEGATIVE

## 2020-04-22 LAB — RAPID URINE DRUG SCREEN, HOSP PERFORMED
Amphetamines: POSITIVE — AB
Barbiturates: NOT DETECTED
Benzodiazepines: NOT DETECTED
Cocaine: POSITIVE — AB
Opiates: NOT DETECTED
Tetrahydrocannabinol: POSITIVE — AB

## 2020-04-22 LAB — ETHANOL: Alcohol, Ethyl (B): <10 mg/dL (ref <10)

## 2020-04-22 LAB — CBG MONITORING, ED: Glucose-Capillary: 59 mg/dL — ABNORMAL LOW (ref 70–99)

## 2020-04-22 MED ORDER — HYDROCODONE-ACETAMINOPHEN 5-325 MG PO TABS
1.0000 | ORAL_TABLET | Freq: Once | ORAL | Status: AC
  Administered 2020-04-22: 1 via ORAL
  Filled 2020-04-22: qty 1

## 2020-04-22 MED ORDER — LEVETIRACETAM IN NACL 1000 MG/100ML IV SOLN
1000.0000 mg | Freq: Once | INTRAVENOUS | Status: AC
  Administered 2020-04-22: 1000 mg via INTRAVENOUS
  Filled 2020-04-22: qty 100

## 2020-04-22 MED ORDER — THIAMINE HCL 100 MG/ML IJ SOLN
100.0000 mg | Freq: Once | INTRAMUSCULAR | Status: AC
  Administered 2020-04-22: 100 mg via INTRAVENOUS
  Filled 2020-04-22: qty 2

## 2020-04-22 NOTE — ED Notes (Signed)
Peer support contacted and patient notified that they will follow up in the community. [employer] called to provide transportation home. Driver to arrive at 3:15XYV.

## 2020-04-22 NOTE — ED Provider Notes (Signed)
[facility]-EMERGENCY DEPT Provider Note   CSN: [id number] Arrival date & time: 04/22/20  5638     History Chief Complaint  Patient presents with  . Seizures  . Addiction Problem    Justin [name] is a 39 y.o. male.  HPI     Justin [name] is a 39 y.o. male, with a history of hepatitis C, HIV, seizures, psychotic episodes, polysubstance abuse, presenting to the ED with seizure that occurred shortly prior to arrival. States he was at a coffee shop, may have been standing or sitting, and woke up with others around him.  Felt confused upon waking. Poor compliance with medications.  He thinks he last took his Keppra at least 2 days ago.  Use of alcohol, methamphetamine, and THC last night.  Denies routine use of alcohol.  Last night, drink 4 beers and at least 3 shots of liquor.  He complains of pain to the back of the head, pain to the left hip, and pain to the right knee.  He also notes some chest pain and tenderness.  He requests to talk to our psych counselor.  Voices compliance with his psych medications.  Denies SI/HI and A/V hallucinations.  He is also requesting testing for hepatitis and Covid.  He states he is vaccinated against Covid. Denies numbness, weakness, shortness of breath, abdominal pain, head injury before the seizure, vomiting, diarrhea, or any other complaints.  Last HIV quant was undetectable and CD4 count 429, both sampled on February 13, 2020.  Past Medical History:  Diagnosis Date  . Endocarditis   . Hepatitis C   . History of syphilis 08/25/2014   Treated by [facility] 07-2014.  [name] HIV (human immunodeficiency virus infection) (HCC)   . Seizures [facility])     Patient Active Problem List   Diagnosis Date Noted  . Severe episode of recurrent major depressive disorder, with psychotic features (HCC)   . Amphetamine and psychostimulant-induced psychosis with delusions (HCC)   . Methamphetamine use disorder, severe, dependence (HCC)  03/23/2020  . 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 Acute psychosis (HCC)   . Substance use disorder 08/07/2019  . Encephalopathy 08/06/2019  . Chronic hepatitis C without hepatic coma (HCC) 08/17/2017  . Screening examination for venereal disease 05/03/2017  . Encounter for long-term (current) use of high-risk medication 05/03/2017  . Anxiety 09/26/2016  . Substance abuse (HCC) 09/26/2016  . Human immunodeficiency virus (HIV) disease (HCC) 09/02/2014  . History of syphilis 08/25/2014    Past Surgical History:  Procedure Laterality Date  . hand surgery Right   . LUMBAR PUNCTURE  08/08/2019      . WRIST SURGERY         Family History  Adopted: Yes  Family history unknown: Yes    Social History   Tobacco Use  . Smoking status: Current Every Day Smoker    Types: Cigarettes  . Smokeless tobacco: Never Used  Vaping Use  . Vaping Use: Never used  Substance Use Topics  . Alcohol use: Yes    Comment: 1-2 a week   . Drug use: Yes    Frequency: 14.0 times per week    Types: Marijuana, Methamphetamines    Home Medications Prior to Admission medications   Medication Sig Start Date End Date Taking? Authorizing Provider  ARIPiprazole (ABILIFY) 5 MG tablet Take 1 tablet (5 mg total) by mouth daily. Patient not taking: Reported on 02/13/2020 12/06/19   [name], MD  bictegravir-emtricitabine-tenofovir AF [facility]) [phone]  MG TABS tablet Take 1 tablet by mouth daily. Patient not taking: Reported on 03/22/2020 02/13/20   [name], MD  bictegravir-emtricitabine-tenofovir AF (BIKTARVY) 50-200-25 MG TABS tablet Take 1 tablet by mouth daily. Patient not taking: Reported on 04/22/2020 04/20/20   Oneta Rack, NP  FLUoxetine (PROZAC) 20 MG capsule Take 20 mg by mouth daily. Patient not taking: Reported on 04/11/2020 04/07/20   [provider]  levETIRAcetam (KEPPRA) 500 MG tablet Take 1 tablet (500 mg total) by mouth 2 (two) times  daily. Patient not taking: Reported on 03/22/2020 02/13/20   [name], MD    Allergies    Patient has no known allergies.  Review of Systems   Review of Systems  Constitutional: Negative for chills, diaphoresis and fever.  Respiratory: Negative for cough and shortness of breath.   Cardiovascular: Negative for leg swelling.  Gastrointestinal: Positive for nausea. Negative for abdominal pain and vomiting.  Musculoskeletal: Positive for arthralgias. Negative for back pain and neck pain.       Anterior chest tenderness.  Neurological: Positive for seizures and headaches. Negative for dizziness, syncope and weakness.  All other systems reviewed and are negative.   Physical Exam Updated Vital Signs BP (!) 144/89 (BP Location: Left Arm)   Pulse 97   Temp 98.5 F (36.9 C) (Oral)   Resp 18   Ht 6' (1.829 m)   Wt 90.7 kg   SpO2 99%   BMI 27.12 kg/m   Physical Exam Vitals and nursing note reviewed.  Constitutional:      General: He is not in acute distress.    Appearance: He is well-developed. He is not diaphoretic.  HENT:     Head: Normocephalic.     Comments: Some tenderness to the left occipital region of the scalp without noted swelling, deformity, instability, or wound.    Mouth/Throat:     Mouth: Mucous membranes are moist.     Pharynx: Oropharynx is clear.  Eyes:     Conjunctiva/sclera: Conjunctivae normal.  Cardiovascular:     Rate and Rhythm: Normal rate and regular rhythm.     Pulses: Normal pulses.          Radial pulses are 2+ on the right side and 2+ on the left side.       Posterior tibial pulses are 2+ on the right side and 2+ on the left side.     Heart sounds: Normal heart sounds.     Comments: Tactile temperature in the extremities appropriate and equal bilaterally. Pulmonary:     Effort: Pulmonary effort is normal. No respiratory distress.     Breath sounds: Normal breath sounds.  Abdominal:     Palpations: Abdomen is soft.     Tenderness: There  is no abdominal tenderness. There is no guarding.  Musculoskeletal:     Cervical back: Neck supple.     Right lower leg: No edema.     Left lower leg: No edema.     Comments: Some tenderness to the area of the cervical spine without deformity, instability, or swelling.  Normal motor function intact in all extremities. No other midline spinal tenderness.   Tenderness with mild swelling to the right knee.  Full range of motion in the right knee.  No noted laxity or deformity.  No tenderness, pain, or other signs of injury to the rest of the right lower extremity.  No signs of injury to the bilateral upper extremities.  Tenderness and pain to the  left lateral hip without deformity or noted instability.  No pain, tenderness, or other signs of injury to the rest of the left lower extremity.  Lymphadenopathy:     Cervical: No cervical adenopathy.  Skin:    General: Skin is warm and dry.  Neurological:     Mental Status: He is alert and oriented to person, place, and time.     Comments: No noted acute cognitive deficit. Sensation grossly intact to light touch in the extremities.   Grip strengths equal bilaterally.   Strength 5/5 in all extremities.  Antalgic gait, but ambulatory without assistance. Coordination intact.  Cranial nerves III-XII grossly intact.  Handles oral secretions without noted difficulty.  No noted phonation or speech deficit. No facial droop.   Psychiatric:        Mood and Affect: Mood and affect normal.        Speech: Speech normal.        Behavior: Behavior normal.     ED Results / Procedures / Treatments   Labs (all labs ordered are listed, but only abnormal results are displayed) Labs Reviewed  COMPREHENSIVE METABOLIC PANEL - Abnormal; Notable for the following components:      Result Value   Total Protein 8.8 (*)    Albumin 5.1 (*)    Total Bilirubin 2.1 (*)    All other components within normal limits  RAPID URINE DRUG SCREEN, HOSP PERFORMED -  Abnormal; Notable for the following components:   Cocaine POSITIVE (*)    Amphetamines POSITIVE (*)    Tetrahydrocannabinol POSITIVE (*)    All other components within normal limits  CBG MONITORING, ED - Abnormal; Notable for the following components:   Glucose-Capillary 59 (*)    All other components within normal limits  RESPIRATORY PANEL BY RT PCR (FLU A&B, COVID)  CBC WITH DIFFERENTIAL/PLATELET  ETHANOL  MAGNESIUM  HEPATITIS PANEL, ACUTE    EKG None  Radiology DG Chest 2 View  Result Date: 04/22/2020 CLINICAL DATA:  Chest pain and seizure EXAM: CHEST - 2 VIEW COMPARISON:  April 01, 2020 FINDINGS: The lungs are clear. The heart size and pulmonary vascularity are normal. No adenopathy. No pneumothorax. No bone lesions. IMPRESSION: Lungs clear.  Cardiac silhouette normal. Electronically Signed   By: Bretta BangWilliam  Woodruff III M.D.   On: 04/22/2020 12:30   CT HEAD WO CONTRAST  Result Date: 04/22/2020 CLINICAL DATA:  Seizure, neck injury. EXAM: CT HEAD WITHOUT CONTRAST CT CERVICAL SPINE WITHOUT CONTRAST TECHNIQUE: Multidetector CT imaging of the head and cervical spine was performed following the standard protocol without intravenous contrast. Multiplanar CT image reconstructions of the cervical spine were also generated. COMPARISON:  March 22, 2020. FINDINGS: CT HEAD FINDINGS Brain: No evidence of acute infarction, hemorrhage, hydrocephalus, extra-axial collection or mass lesion/mass effect. Vascular: No hyperdense vessel or unexpected calcification. Skull: Normal. Negative for fracture or focal lesion. Sinuses/Orbits: No acute finding. Other: None. CT CERVICAL SPINE FINDINGS Alignment: Reversal of normal lordosis is noted which most likely is positional in origin. Skull base and vertebrae: No acute fracture. No primary bone lesion or focal pathologic process. Soft tissues and spinal canal: No prevertebral fluid or swelling. No visible canal hematoma. Disc levels:  Moderate degenerative  disc disease is noted at C5-6. Upper chest: Negative. Other: None. IMPRESSION: 1. Normal head CT. 2. Moderate degenerative disc disease is noted at C5-6. No acute abnormality seen in the cervical spine. Electronically Signed   By: Lupita RaiderJames  Green Jr M.D.   On: 04/22/2020 13:17  CT CERVICAL SPINE WO CONTRAST  Result Date: 04/22/2020 CLINICAL DATA:  Seizure, neck injury. EXAM: CT HEAD WITHOUT CONTRAST CT CERVICAL SPINE WITHOUT CONTRAST TECHNIQUE: Multidetector CT imaging of the head and cervical spine was performed following the standard protocol without intravenous contrast. Multiplanar CT image reconstructions of the cervical spine were also generated. COMPARISON:  March 22, 2020. FINDINGS: CT HEAD FINDINGS Brain: No evidence of acute infarction, hemorrhage, hydrocephalus, extra-axial collection or mass lesion/mass effect. Vascular: No hyperdense vessel or unexpected calcification. Skull: Normal. Negative for fracture or focal lesion. Sinuses/Orbits: No acute finding. Other: None. CT CERVICAL SPINE FINDINGS Alignment: Reversal of normal lordosis is noted which most likely is positional in origin. Skull base and vertebrae: No acute fracture. No primary bone lesion or focal pathologic process. Soft tissues and spinal canal: No prevertebral fluid or swelling. No visible canal hematoma. Disc levels:  Moderate degenerative disc disease is noted at C5-6. Upper chest: Negative. Other: None. IMPRESSION: 1. Normal head CT. 2. Moderate degenerative disc disease is noted at C5-6. No acute abnormality seen in the cervical spine. Electronically Signed   By: Lupita Raider M.D.   On: 04/22/2020 13:17   DG Knee Complete 4 Views Right  Result Date: 04/22/2020 CLINICAL DATA:  Pain following fall EXAM: RIGHT KNEE - COMPLETE 4+ VIEW COMPARISON:  None. FINDINGS: Frontal, lateral, and bilateral oblique views were obtained. No fracture, dislocation, or joint effusion. Joint spaces appear normal. No erosive change.  IMPRESSION: No fracture, dislocation, or joint effusion. No evident arthropathy. Electronically Signed   By: Bretta Bang III M.D.   On: 04/22/2020 12:31   DG Hip Unilat W or Wo Pelvis 2-3 Views Left  Result Date: 04/22/2020 CLINICAL DATA:  Pain following fall EXAM: DG HIP (WITH OR WITHOUT PELVIS) 2-3V LEFT COMPARISON:  None. FINDINGS: Frontal pelvis as well as frontal and lateral left hip images were obtained. No fracture or dislocation. Joint spaces appear normal. No erosive change. IMPRESSION: No fracture or dislocation.  No evident arthropathy. Electronically Signed   By: Bretta Bang III M.D.   On: 04/22/2020 12:31    Procedures Procedures (including critical care time)  Medications Ordered in ED Medications  levETIRAcetam (KEPPRA) IVPB 1000 mg/100 mL premix (0 mg Intravenous Stopped 04/22/20 1432)  thiamine (B-1) injection 100 mg (100 mg Intravenous Given 04/22/20 1242)  HYDROcodone-acetaminophen (NORCO/VICODIN) 5-325 MG per tablet 1 tablet (1 tablet Oral Given 04/22/20 1504)    ED Course  I have reviewed the triage vital signs and the nursing notes.  Pertinent labs & imaging results that were available during my care of the patient were reviewed by me and considered in my medical decision making (see chart for details).    MDM Rules/Calculators/A&P                          Patient presents following a seizure. Patient is nontoxic appearing, afebrile, not tachycardic, not tachypneic, not hypotensive, maintains excellent SPO2 on room air, and is in no apparent distress.  Patient noncompliant with his seizure medications, which I suspect is the reason for his seizure today.   I have reviewed the patient's chart to obtain more information.   I reviewed and interpreted the patient's labs and radiological studies. No acute abnormalities on patient's imaging studies. CBG of 59 with no changes in mental status addressed with feeding patient. No seizure activity here in  the ED.  Says he has an upcoming appointment with neurology.  He was  encouraged to keep this appointment. Patient advised to follow-up with infectious disease as well.   Findings and plan of care discussed with Carmell Austria, MD.   Vitals:   04/22/20 1245 04/22/20 1315 04/22/20 1452 04/22/20 1507  BP: 125/87 106/75 133/86 133/80  Pulse: 83 94 82 82  Resp: 17 20 17    Temp:   98.1 F (36.7 C)   TempSrc:   Oral   SpO2: 100% 97% 99%   Weight:      Height:         Final Clinical Impression(s) / ED Diagnoses Final diagnoses:  Seizure [facility])    Rx / DC Orders ED Discharge Orders    None       [facility] 04/22/20 1631    04/24/20, MD 04/23/20 [id number]

## 2020-04-22 NOTE — ED Triage Notes (Signed)
Arrives via EMS, pt reports seizure activity an hour ago, ambulatory with EMS. Patient is alert and oriented.
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
## 2020-04-22 NOTE — Discharge Instructions (Addendum)
The imaging studies showed no acute abnormalities. You have been seen today for injuries following a seizure. There were no acute abnormalities on the x-rays, including no sign of fracture or dislocation, however, there could be injuries to the soft tissues, such as the ligaments or tendons that are not seen on xrays. There could also be what are called occult fractures that are small fractures not seen on xray. Antiinflammatory medications: Take 600 mg of ibuprofen every 6 hours or 440 mg (over the counter dose) to 500 mg (prescription dose) of naproxen every 12 hours for the next 3 days. After this time, these medications may be used as needed for pain. Take these medications with food to avoid upset stomach. Choose only one of these medications, do not take them together. Acetaminophen (generic for Tylenol): Should you continue to have additional pain while taking the ibuprofen or naproxen, you may add in acetaminophen as needed. Your daily total maximum amount of acetaminophen from all sources should be limited to 4000mg /day for persons without liver problems, or 2000mg /day for those with liver problems. Ice: May apply ice to the area over the next 24 hours for 15 minutes at a time to reduce swelling. Elevation: Keep the extremity elevated as often as possible to reduce pain and inflammation. Support: Wear the ACE wrap for support and comfort. Wear this until pain resolves.  Follow up: If symptoms are improving, you may follow up with your primary care provider for any continued management. If symptoms are not starting to improve within a week, you should follow up with the orthopedic specialist within two weeks. Return: Return to the ED for numbness, weakness, increasing pain, overall worsening symptoms, loss of function, or if symptoms are not improving, you have tried to follow up with the orthopedic specialist, and have been unable to do so. We also highly recommend you follow-up with the infectious  disease office for HIV and any concerns for hepatitis C.  For prescription assistance, may try using prescription discount sites or apps, such as goodrx.com or Good Rx smart phone app.

## 2020-04-22 NOTE — ED Notes (Signed)
Pt transported to CT ?

## 2020-04-23 NOTE — Patient Outreach (Signed)
[facility] made several attempts to contact Pt in the community. [facility] left contact information with family member to follow up with Pt.

## 2020-06-11 ENCOUNTER — Telehealth: Payer: Self-pay

## 2020-06-11 NOTE — Telephone Encounter (Signed)
Patient called requesting refills on Prozac. Medication was prescribed by another provider who informed him he needed to contact his PCP. Instructed patient to establish care at [facility] or Internal Medicine in order to receive refills as he does not have a PCP. Informed patient that Dr. [name] is not a primary care doctor and should only be used for management of infectious disease. Pt verbalized understanding. Phone numbers to both clinics provided.  Braylee Bosher Loyola Mast, RN

## 2020-06-21 ENCOUNTER — Other Ambulatory Visit: Payer: Self-pay

## 2020-06-21 ENCOUNTER — Emergency Department (HOSPITAL_COMMUNITY)
Admission: EM | Admit: 2020-06-21 | Discharge: 2020-06-21 | Disposition: A | Discharge status: Home / Self Care | Attending: Emergency Medicine | Admitting: Emergency Medicine

## 2020-06-21 ENCOUNTER — Encounter (HOSPITAL_COMMUNITY): Payer: Self-pay

## 2020-06-21 ENCOUNTER — Emergency Department
Admission: EM | Admit: 2020-06-21 | Discharge: 2020-06-21 | Disposition: A | Discharge status: Home / Self Care | Payer: Self-pay | Attending: Emergency Medicine | Admitting: Emergency Medicine

## 2020-06-21 DIAGNOSIS — F17210 Nicotine dependence, cigarettes, uncomplicated: Secondary | ICD-10-CM | POA: Insufficient documentation

## 2020-06-21 DIAGNOSIS — R569 Unspecified convulsions: Secondary | ICD-10-CM

## 2020-06-21 DIAGNOSIS — G40909 Epilepsy, unspecified, not intractable, without status epilepticus: Secondary | ICD-10-CM | POA: Insufficient documentation

## 2020-06-21 DIAGNOSIS — Z79899 Other long term (current) drug therapy: Secondary | ICD-10-CM | POA: Insufficient documentation

## 2020-06-21 DIAGNOSIS — Z21 Asymptomatic human immunodeficiency virus [HIV] infection status: Secondary | ICD-10-CM | POA: Diagnosis not present

## 2020-06-21 LAB — COMPREHENSIVE METABOLIC PANEL
ALT: 45 U/L — ABNORMAL HIGH (ref 0–44)
AST: 89 U/L — ABNORMAL HIGH (ref 15–41)
Albumin: 4.2 g/dL (ref 3.5–5.0)
Alkaline Phosphatase: 80 U/L (ref 38–126)
Anion gap: 9 (ref 5–15)
BUN: 12 mg/dL (ref 6–20)
CO2: 24 mmol/L (ref 22–32)
Calcium: 9.6 mg/dL (ref 8.9–10.3)
Chloride: 106 mmol/L (ref 98–111)
Creatinine, Ser: 1.03 mg/dL (ref 0.61–1.24)
GFR, Estimated: >60 mL/min (ref >60)
Glucose, Bld: 109 mg/dL — ABNORMAL HIGH (ref 70–99)
Potassium: 3.6 mmol/L (ref 3.5–5.1)
Sodium: 139 mmol/L (ref 135–145)
Total Bilirubin: 1.6 mg/dL — ABNORMAL HIGH (ref 0.3–1.2)
Total Protein: 7.7 g/dL (ref 6.5–8.1)

## 2020-06-21 LAB — BASIC METABOLIC PANEL
Anion gap: 9 (ref 5–15)
BUN: 9 mg/dL (ref 6–20)
CO2: 28 mmol/L (ref 22–32)
Calcium: 9.6 mg/dL (ref 8.9–10.3)
Chloride: 103 mmol/L (ref 98–111)
Creatinine, Ser: 1.13 mg/dL (ref 0.61–1.24)
GFR, Estimated: >60 mL/min (ref >60)
Glucose, Bld: 83 mg/dL (ref 70–99)
Potassium: 4.1 mmol/L (ref 3.5–5.1)
Sodium: 140 mmol/L (ref 135–145)

## 2020-06-21 LAB — CBC WITH DIFFERENTIAL/PLATELET
Abs Immature Granulocytes: 0.02 10*3/uL (ref 0.00–0.07)
Basophils Absolute: 0 10*3/uL (ref 0.0–0.1)
Basophils Relative: 1 %
Eosinophils Absolute: 0.1 10*3/uL (ref 0.0–0.5)
Eosinophils Relative: 1 %
HCT: 48.3 % (ref 39.0–52.0)
Hemoglobin: 15.7 g/dL (ref 13.0–17.0)
Immature Granulocytes: 0 %
Lymphocytes Relative: 43 %
Lymphs Abs: 2.2 10*3/uL (ref 0.7–4.0)
MCH: 31.6 pg (ref 26.0–34.0)
MCHC: 32.5 g/dL (ref 30.0–36.0)
MCV: 97.2 fL (ref 80.0–100.0)
Monocytes Absolute: 0.5 10*3/uL (ref 0.1–1.0)
Monocytes Relative: 10 %
Neutro Abs: 2.4 10*3/uL (ref 1.7–7.7)
Neutrophils Relative %: 45 %
Platelets: 159 10*3/uL (ref 150–400)
RBC: 4.97 MIL/uL (ref 4.22–5.81)
RDW: 12 % (ref 11.5–15.5)
WBC: 5.3 10*3/uL (ref 4.0–10.5)
nRBC: 0 % (ref 0.0–0.2)

## 2020-06-21 LAB — CBG MONITORING, ED: Glucose-Capillary: 73 mg/dL (ref 70–99)

## 2020-06-21 MED ORDER — LEVETIRACETAM 500 MG PO TABS: 500.0000 mg | ORAL_TABLET | Freq: Two times a day (BID) | ORAL | 0 refills | Status: DC

## 2020-06-21 MED ORDER — LEVETIRACETAM 500 MG PO TABS
1000.0000 mg | ORAL_TABLET | Freq: Once | ORAL | Status: DC
  Filled 2020-06-21: qty 2

## 2020-06-21 MED ORDER — ACETAMINOPHEN 325 MG PO TABS
650.0000 mg | ORAL_TABLET | Freq: Once | ORAL | Status: AC
  Administered 2020-06-21: 650 mg via ORAL
  Filled 2020-06-21: qty 2

## 2020-06-21 MED ORDER — LEVETIRACETAM IN NACL 1000 MG/100ML IV SOLN
1000.0000 mg | Freq: Once | INTRAVENOUS | Status: AC
  Administered 2020-06-21: 1000 mg via INTRAVENOUS
  Filled 2020-06-21: qty 100

## 2020-06-21 MED ORDER — SODIUM CHLORIDE 0.9 % IV SOLN: 2000.0000 mg | Freq: Once | INTRAVENOUS | Status: DC

## 2020-06-21 MED ORDER — LEVETIRACETAM IN NACL 1000 MG/100ML IV SOLN
1000.0000 mg | Freq: Once | INTRAVENOUS | Status: DC
  Filled 2020-06-21: qty 100

## 2020-06-21 MED ORDER — FLUOXETINE HCL 20 MG PO TABS: 20.0000 mg | ORAL_TABLET | Freq: Every day | ORAL | 0 refills | Status: DC

## 2020-06-21 NOTE — ED Notes (Addendum)
After taking Keppra pt states "Okay,my ride is here I have to leave." And proceeded to walk out of door. This RN attempted to get patient to stop but patient left ED. Pt left prior to signing AMA paperwork.

## 2020-06-21 NOTE — ED Notes (Addendum)
This RN went to hallway to give patient Justin [name], pt not in recliner. Pt not in previous room or bathroom. Don Perking MD made aware.

## 2020-06-21 NOTE — ED Notes (Signed)
This RN informed by [employer] operational officer that pt was outside of EMS bay doors on phone, this RN came to EMS bay to find patient attempting to call a ride on phone. Don Perking MD made aware and responded as well. Pt stating "I just can't stay here any more, I feel like the cops are going to show up." Pt educated that he cannot leave with IV in place and was offered further medical care and screening by Don Perking MD.  This RN and Don Perking MD escorted this patient back to room, pt allowed this RN to take out IV.

## 2020-06-21 NOTE — ED Notes (Signed)
Patient had jerking episode after arrival to triage, quickly awake and responding

## 2020-06-21 NOTE — Discharge Instructions (Addendum)
As discussed, I have refilled your Keppra and Prozac prescription.  Take as prescribed.  Please follow-up with PCP within the next week for further evaluation.  I suspect your seizure is related to not taking your medication.  I have included the number of a neurologist.  Please follow-up in the outpatient setting for further evaluation of your seizures.  Return to the ER for new or worsening symptoms.

## 2020-06-21 NOTE — ED Triage Notes (Signed)
Pt presents to ER via [facility] from home.  Pt states he has a hx of seizures, and had one.  Unknown how long seizure lasted.  Ems was called by pts roommate who found him and called.  Pt states he ran out of his Keppra 2 days ago and has not gotten it refilled.  Pt had a short 2-3 minutes post-ictal period.  Pt states he also had another seizure appx 2 days ago.  Pt now A&O x4.  Pt endorses hurting al over at this time.  Pt in NAD at this time.

## 2020-06-21 NOTE — ED Provider Notes (Signed)
[facility] Emergency Department Provider Note  ____________________________________________  Time seen: Approximately 1:04 AM  I have reviewed the triage vital signs and the nursing notes.   HISTORY  Chief Complaint Seizures   HPI Justin [name] is a 39 y.o. male with a history of HIV, Hep C, seizure disorder, methamphetamine abuse who is presenting to the hospital after having a seizure.   Patient has been noncompliant with his Keppra for 2 days.  Has been using methamphetamine.  He does have the medicine at home.  Today he was found by his roommate who called 911.  Unknown how long patient was down.  Patient arrives back at baseline in no post-ictal phase.  Denies any injuries from the syncopal event.  He denies headache, neck pain, fever chills, abdominal pain, nausea or vomiting, chest pain or shortness of breath. Patient reports feeling depressed recently after stopping prozac cold [place] 2 weeks ago.  He reports that he ran out of it.  He had received a prescription from an ER physician and did have a chance to refill it.  He has been feeling like he does not want to be around anymore but no active SI.  Denies any prior suicide attempts.  The patient tells me that he is very well liked in the community, he is a rock 'n' roll musician, has good relationships in his life.   Past Medical History:  Diagnosis Date  . Endocarditis   . Hepatitis C   . History of syphilis 08/25/2014   Treated by [facility] 07-2014.  [name] HIV (human immunodeficiency virus infection) (HCC)   . Seizures [facility])     Patient Active Problem List   Diagnosis Date Noted  . Severe episode of recurrent major depressive disorder, with psychotic features (HCC)   . Amphetamine and psychostimulant-induced psychosis with delusions (HCC)   . Methamphetamine use disorder, severe, dependence (HCC) 03/23/2020  . 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 Acute psychosis (HCC)   .  Substance use disorder 08/07/2019  . Encephalopathy 08/06/2019  . Chronic hepatitis C without hepatic coma (HCC) 08/17/2017  . Screening examination for venereal disease 05/03/2017  . Encounter for long-term (current) use of high-risk medication 05/03/2017  . Anxiety 09/26/2016  . Substance abuse (HCC) 09/26/2016  . Human immunodeficiency virus (HIV) disease (HCC) 09/02/2014  . History of syphilis 08/25/2014    Past Surgical History:  Procedure Laterality Date  . hand surgery Right   . LUMBAR PUNCTURE  08/08/2019      . WRIST SURGERY      Prior to Admission medications   Medication Sig Start Date End Date Taking? Authorizing Provider  ARIPiprazole (ABILIFY) 5 MG tablet Take 1 tablet (5 mg total) by mouth daily. Patient not taking: Reported on 02/13/2020 12/06/19   [name], MD  bictegravir-emtricitabine-tenofovir AF (BIKTARVY) 50-200-25 MG TABS tablet Take 1 tablet by mouth daily. Patient not taking: Reported on 03/22/2020 02/13/20   [name], MD  bictegravir-emtricitabine-tenofovir AF (BIKTARVY) 50-200-25 MG TABS tablet Take 1 tablet by mouth daily. Patient not taking: Reported on 04/22/2020 04/20/20   Oneta Rack, NP  FLUoxetine (PROZAC) 20 MG capsule Take 20 mg by mouth daily. Patient not taking: Reported on 04/11/2020 04/07/20   [provider]  levETIRAcetam (KEPPRA) 500 MG tablet Take 1 tablet (500 mg total) by mouth 2 (two) times daily. Patient not taking: Reported on 03/22/2020 02/13/20   [name], MD    Allergies Patient has no known  allergies.  Family History  Adopted: Yes  Family history unknown: Yes    Social History Social History   Tobacco Use  . Smoking status: Current Every Day Smoker    Types: Cigarettes  . Smokeless tobacco: Never Used  Vaping Use  . Vaping Use: Never used  Substance Use Topics  . Alcohol use: Yes    Comment: 1-2 a week   . Drug use: Yes    Frequency: 14.0 times per week    Types: Marijuana,  Methamphetamines    Review of Systems  Constitutional: Negative for fever. + seizure Eyes: Negative for visual changes. ENT: Negative for sore throat. Neck: No neck pain  Cardiovascular: Negative for chest pain. Respiratory: Negative for shortness of breath. Gastrointestinal: Negative for abdominal pain, vomiting or diarrhea. Genitourinary: Negative for dysuria. Musculoskeletal: Negative for back pain. Skin: Negative for rash. Neurological: Negative for headaches, weakness or numbness. Psych: No SI or HI  ____________________________________________   PHYSICAL EXAM:  VITAL SIGNS: ED Triage Vitals  Enc Vitals Group     BP 06/21/20 0041 (!) 154/99     Pulse Rate 06/21/20 0041 93     Resp 06/21/20 0041 16     Temp 06/21/20 0041 98.1 F (36.7 C)     Temp Source 06/21/20 0041 Oral     SpO2 06/21/20 0041 98 %     Weight 06/21/20 0045 220 lb (99.8 kg)     Height 06/21/20 0045 6' (1.829 m)     Head Circumference --      Peak Flow --      Pain Score 06/21/20 0041 5     Pain Loc --      Pain Edu? --      Excl. in GC? --     Constitutional: Alert and oriented. Well appearing and in no apparent distress. HEENT:      Head: Normocephalic and atraumatic.         Eyes: Conjunctivae are normal. Sclera is non-icteric.       Mouth/Throat: Mucous membranes are moist.       Neck: Supple with no signs of meningismus. Cardiovascular: Regular rate and rhythm. No murmurs, gallops, or rubs. 2+ symmetrical distal pulses are present in all extremities. No JVD. Respiratory: Normal respiratory effort. Lungs are clear to auscultation bilaterally. No wheezes, crackles, or rhonchi.  Gastrointestinal: Soft, non tender, and non distended Musculoskeletal: Nontender with normal range of motion in all extremities. No edema, cyanosis, or erythema of extremities. Neurologic: Normal speech and language. Face is symmetric. Moving all extremities. No gross focal neurologic deficits are appreciated. Skin:  Skin is warm, dry and intact. No rash noted. Psychiatric: Mood and affect are normal. Speech and behavior are normal.  ____________________________________________   LABS (all labs ordered are listed, but only abnormal results are displayed)  Labs Reviewed  COMPREHENSIVE METABOLIC PANEL - Abnormal; Notable for the following components:      Result Value   Glucose, Bld 109 (*)    AST 89 (*)    ALT 45 (*)    Total Bilirubin 1.6 (*)    All other components within normal limits  URINE DRUG SCREEN, QUALITATIVE ([facility] ONLY)  CBC   ____________________________________________  EKG  none  ____________________________________________  RADIOLOGY  none  ____________________________________________   PROCEDURES  Procedure(s) performed: None Procedures Critical Care performed:  None ____________________________________________   INITIAL IMPRESSION / ASSESSMENT AND PLAN / ED COURSE   39 y.o. male with a history of HIV, Hep C, seizure disorder,  methamphetamine abuse who is presenting to the hospital after having a seizure in the setting of noncompliance with his Keppra for 2 days and active use of methamphetamines.  Patient also described being depressed and feeling like he does not want to be around anymore but denies any suicidal thoughts and can contract for safety.  I discussed with the patient the plan to load him with Keppra, check some basic blood work and monitor him for a couple of hours for any further episodes of seizure.  Patient was in agreement initially but then decided he wanted to leave after receiving half of his Keppra dose.  His IV was removed and patient was going to sign AMA.  He reports that he was feeling very claustrophobic in the room and asked if he could be moved to hallway bed.  He was moved to a hallway bed.  Patient remains pleasant and cooperative with no indications for IVC at this time.  He did asked to speak with a psychiatrist.  I discussed with the nurse  practitioner who will come and see him in the ED.  We will give the second dose of his Keppra load p.o.  Basic chemistry panel does not show any acute abnormalities.  We will continue to monitor for any recurrence of the seizures.  No signs of trauma on history and physical exam.  Old medical records reviewed showing several prior visits to the ED for similar.  _________________________ 1:41 AM on 06/21/2020 -----------------------------------------  Patient requesting dc AMA says his ride is here. He did take his loading dose of Keppra. Would not stay to be monitored for recurrent seizures. Patient saying that he was afraid that the cops will come to the hospital for him and that he would not have a ride later on today so he had to go right now. Patient did not meet criteria for IVC. He was discharged to the care of friend who drove him out of the emergency room.      _____________________________________________ Please note:  Patient was evaluated in Emergency Department today for the symptoms described in the history of present illness. Patient was evaluated in the context of the global COVID-19 pandemic, which necessitated consideration that the patient might be at risk for infection with the SARS-CoV-2 virus that causes COVID-19. Institutional protocols and algorithms that pertain to the evaluation of patients at risk for COVID-19 are in a state of rapid change based on information released by regulatory bodies including the CDC and federal and state organizations. These policies and algorithms were followed during the patient's care in the ED.  Some ED evaluations and interventions may be delayed as a result of limited staffing during the pandemic.   [place] Controlled Substance Database was reviewed by me. ____________________________________________   FINAL CLINICAL IMPRESSION(S) / ED DIAGNOSES   Final diagnoses:  Seizure (HCC)      NEW MEDICATIONS STARTED DURING THIS VISIT:  ED  Discharge Orders    None       Note:  This document was prepared using Dragon voice recognition software and may include unintentional dictation errors.    Don Perking, [place], MD 06/21/20 [phone]

## 2020-06-21 NOTE — ED Notes (Signed)
Pt now stating to [facility] MD that he will be willing to stay if he can sit in a chair in the hallway. Pt provided recliner and moved to hallway room.

## 2020-06-21 NOTE — ED Triage Notes (Addendum)
Patient arrived by [facility] following reported seizure at jail today. Patient seen earlier this am for seizure after reporting that he ran out of Keppra and has been out x 2-3 days. Patient arrived alert and oriented. Deputies at bedside. CBG 120

## 2020-06-21 NOTE — ED Provider Notes (Signed)
[name] EMERGENCY DEPARTMENT Provider Note   CSN: [id number] Arrival date & time: 06/21/20  1647     History No chief complaint on file.   Justin [name] is a 39 y.o. male with a past medical history significant for HIV, hepatitis C, history of endocarditis, history of syphilis, and history of seizures who presents to the ED after a seizure that occurred around 3:30 PM.  Patient reports from jail. Officers at bedside did not witness the seizure and notes they found patient on the floor. Chart reviewed. Patient was seen at [facility] early this morning for a seizure. Patient states prior to today, Justin [name] was noncompliant with his Keppra for 3-4 days. Justin [name] also had a seizure 3 days ago. Denies urinary incontinence and tongue biting. Denies any known trauma. Justin [name] notes Justin [name] "feels sore all over". Unknown how long seizure lasted for. Denies alcohol use. Per chart review, patient admited to earlier doctor about recent methamphetamine use. Patient denies any injuries from seizure. On initial evaluation Justin [name] appears slightly post-ictal and falls asleep during evaluation, but answers questions and follows commands appropriately.   Chart reviewed. Patient had a CT head on 04/22/20 after a seizure which was unremarkable. It does not appear that patient has ever follow-up in the outpatient setting with a neurologist.  History obtained from patient and past medical records. No interpreter used during encounter.      Past Medical History:  Diagnosis Date  . Endocarditis   . Hepatitis C   . History of syphilis 08/25/2014   Treated by [facility] 07-2014.  [name] HIV (human immunodeficiency virus infection) (HCC)   . Seizures [facility])     Patient Active Problem List   Diagnosis Date Noted  . Severe episode of recurrent major depressive disorder, with psychotic features (HCC)   . Amphetamine and psychostimulant-induced psychosis with delusions (HCC)   . Methamphetamine use disorder, severe, dependence (HCC)  03/23/2020  . 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 Acute psychosis (HCC)   . Substance use disorder 08/07/2019  . Encephalopathy 08/06/2019  . Chronic hepatitis C without hepatic coma (HCC) 08/17/2017  . Screening examination for venereal disease 05/03/2017  . Encounter for long-term (current) use of high-risk medication 05/03/2017  . Anxiety 09/26/2016  . Substance abuse (HCC) 09/26/2016  . Human immunodeficiency virus (HIV) disease (HCC) 09/02/2014  . History of syphilis 08/25/2014    Past Surgical History:  Procedure Laterality Date  . hand surgery Right   . LUMBAR PUNCTURE  08/08/2019      . WRIST SURGERY         Family History  Adopted: Yes  Family history unknown: Yes    Social History   Tobacco Use  . Smoking status: Current Every Day Smoker    Types: Cigarettes  . Smokeless tobacco: Never Used  Vaping Use  . Vaping Use: Never used  Substance Use Topics  . Alcohol use: Yes    Comment: 1-2 a week   . Drug use: Yes    Frequency: 14.0 times per week    Types: Marijuana, Methamphetamines    Home Medications Prior to Admission medications   Medication Sig Start Date End Date Taking? Authorizing Provider  ARIPiprazole (ABILIFY) 5 MG tablet Take 1 tablet (5 mg total) by mouth daily. Patient not taking: Reported on 02/13/2020 12/06/19   [name], MD  bictegravir-emtricitabine-tenofovir AF (BIKTARVY) 50-200-25 MG TABS tablet Take 1 tablet by mouth daily. Patient not taking: Reported on 03/22/2020 02/13/20   Comer,  Belia Heman, MD  bictegravir-emtricitabine-tenofovir AF (BIKTARVY) 50-200-25 MG TABS tablet Take 1 tablet by mouth daily. Patient not taking: Reported on 04/22/2020 04/20/20   Oneta Rack, NP  FLUoxetine (PROZAC) 20 MG capsule Take 20 mg by mouth daily. Patient not taking: Reported on 04/11/2020 04/07/20   [provider]  FLUoxetine (PROZAC) 20 MG tablet Take 1 tablet (20 mg total) by mouth daily. 06/21/20   Mannie Stabile, PA-C  levETIRAcetam (KEPPRA) 500 MG tablet Take 1 tablet (500 mg total) by mouth 2 (two) times daily. Patient not taking: Reported on 03/22/2020 02/13/20   [name], MD  levETIRAcetam (KEPPRA) 500 MG tablet Take 1 tablet (500 mg total) by mouth 2 (two) times daily. 06/21/20   Mannie Stabile, PA-C    Allergies    Patient has no known allergies.  Review of Systems   Review of Systems  Constitutional: Negative for chills and fever.  Respiratory: Negative for shortness of breath.   Cardiovascular: Negative for chest pain.  Gastrointestinal: Negative for abdominal pain, nausea and vomiting.  Musculoskeletal: Positive for myalgias.  Neurological: Positive for seizures. Negative for dizziness and headaches.  All other systems reviewed and are negative.   Physical Exam Updated Vital Signs BP 120/84   Pulse 65   Temp 97.8 F (36.6 C) (Oral)   Resp 17   SpO2 100%   Physical Exam Vitals and nursing note reviewed.  Constitutional:      General: Justin [name] is not in acute distress.    Appearance: Justin [name] is not toxic-appearing.     Comments: Drowsy during initial evaluation. Opens eyes to voice. Answers questions and follows commands appropriately.  HENT:     Head: Normocephalic.  Eyes:     Pupils: Pupils are equal, round, and reactive to light.  Neck:     Comments: No cervical midline tenderness Cardiovascular:     Rate and Rhythm: Normal rate and regular rhythm.     Pulses: Normal pulses.     Heart sounds: Normal heart sounds. No murmur heard. No friction rub. No gallop.   Pulmonary:     Effort: Pulmonary effort is normal.     Breath sounds: Normal breath sounds.  Abdominal:     General: Abdomen is flat. There is no distension.     Palpations: Abdomen is soft.     Tenderness: There is no abdominal tenderness. There is no guarding or rebound.  Musculoskeletal:     Cervical back: Neck supple.     Comments: No thoracic or lumbar midline tenderness  Skin:     General: Skin is warm and dry.  Neurological:     General: No focal deficit present.     Mental Status: Justin [name] is alert.     Comments: Speech is clear, able to follow commands CN III-XII intact Normal strength in upper and lower extremities bilaterally including dorsiflexion and plantar flexion, strong and equal grip strength Sensation grossly intact throughout Moves extremities without ataxia, coordination intact     ED Results / Procedures / Treatments   Labs (all labs ordered are listed, but only abnormal results are displayed) Labs Reviewed  CBC WITH DIFFERENTIAL/PLATELET  BASIC METABOLIC PANEL  CBG MONITORING, ED    EKG None  Radiology No results found.  Procedures Procedures (including critical care time)  Medications Ordered in ED Medications  levETIRAcetam (KEPPRA) IVPB 1000 mg/100 mL premix (0 mg Intravenous Stopped 06/21/20 1906)  acetaminophen (TYLENOL) tablet 650 mg (650 mg Oral Given 06/21/20 1754)  ED Course  I have reviewed the triage vital signs and the nursing notes.  Pertinent labs & imaging results that were available during my care of the patient were reviewed by me and considered in my medical decision making (see chart for details).  Clinical Course as of 06/21/20 2135  Wynelle Link Jun 21, 2020  1716 Reassessed patient at bedside.  Patient no longer postictal.  Patient answering questions and commands appropriately. Patient requesting a prescription to get out of jail.  [CA]  2051 Reassessed patient at bedside. Patient back to baseline. No further seizures. Normal neurological exam. [CA]  2105 Reassessed patient at bedside. Still not seizure activity after 5 hours of observation here in the ED.  [CA]    Clinical Course User Index [CA] Jesusita Oka   MDM Rules/Calculators/A&P                         39 year old male presents to the ED from jail after a witnessed seizure.  Patient was seen at [facility] early this morning after a seizure and was  given a loading dose of Keppra.  Patient states Justin [name] has been noncompliant with his Keppra for the past 3 to 4 days.  Patient denies any trauma.  Upon arrival, vitals all within normal limits.  Patient is afebrile, not tachycardic or hypoxic.  During my initial exam, patient appears slightly postictal and is slightly drowsy, but able to answer questions and follow commands appropriately.  Will obtain routine labs to rule out electrolyte abnormalities.  Suspect multiple seizures related to medication noncompliance.  No fever to suggest infectious etiology. Will give 1000 mg of Keppra here in the ED and observe for seizure activity.  Discussed case with Dr. Donnald Garre who agrees with assessment and plan.  CBC unremarkable with no leukocytosis and normal hemoglobin. BMP unremarkable with normal renal function no major electrolyte derangements. Patient remained seizure-free while being observed in the ED for 5 hours. Suspect seizures related to medication non-compliance. Instructed patient to take medication as prescribed. Prescription refilled. Neurology number given to patient at discharge. Strict ED precautions discussed with patient. Patient states understanding and agrees to plan. Patient discharged home in no acute distress and stable vitals Final Clinical Impression(s) / ED Diagnoses Final diagnoses:  Seizure [facility])    Rx / DC Orders ED Discharge Orders         Ordered    levETIRAcetam (KEPPRA) 500 MG tablet  2 times daily        06/21/20 2013    FLUoxetine (PROZAC) 20 MG tablet  Daily        06/21/20 2105           Jesusita Oka 06/21/20 2138    Arby Barrette, MD 06/25/20 1132

## 2020-06-25 ENCOUNTER — Emergency Department (HOSPITAL_COMMUNITY)

## 2020-06-25 ENCOUNTER — Other Ambulatory Visit: Payer: Self-pay

## 2020-06-25 ENCOUNTER — Encounter (HOSPITAL_COMMUNITY): Payer: Self-pay | Admitting: Emergency Medicine

## 2020-06-25 ENCOUNTER — Inpatient Hospital Stay (HOSPITAL_COMMUNITY)
Admission: EM | Admit: 2020-06-25 | Discharge: 2020-06-27 | DRG: 101 | Attending: Internal Medicine | Admitting: Internal Medicine

## 2020-06-25 DIAGNOSIS — F17210 Nicotine dependence, cigarettes, uncomplicated: Secondary | ICD-10-CM | POA: Diagnosis present

## 2020-06-25 DIAGNOSIS — F1594 Other stimulant use, unspecified with stimulant-induced mood disorder: Secondary | ICD-10-CM

## 2020-06-25 DIAGNOSIS — F1990 Other psychoactive substance use, unspecified, uncomplicated: Secondary | ICD-10-CM | POA: Diagnosis present

## 2020-06-25 DIAGNOSIS — R569 Unspecified convulsions: Secondary | ICD-10-CM

## 2020-06-25 DIAGNOSIS — F1520 Other stimulant dependence, uncomplicated: Secondary | ICD-10-CM

## 2020-06-25 DIAGNOSIS — B182 Chronic viral hepatitis C: Secondary | ICD-10-CM | POA: Diagnosis present

## 2020-06-25 DIAGNOSIS — G40909 Epilepsy, unspecified, not intractable, without status epilepticus: Principal | ICD-10-CM | POA: Diagnosis present

## 2020-06-25 DIAGNOSIS — Z20822 Contact with and (suspected) exposure to covid-19: Secondary | ICD-10-CM | POA: Diagnosis present

## 2020-06-25 DIAGNOSIS — Z79899 Other long term (current) drug therapy: Secondary | ICD-10-CM

## 2020-06-25 DIAGNOSIS — B20 Human immunodeficiency virus [HIV] disease: Secondary | ICD-10-CM | POA: Diagnosis present

## 2020-06-25 DIAGNOSIS — F1590 Other stimulant use, unspecified, uncomplicated: Secondary | ICD-10-CM | POA: Diagnosis present

## 2020-06-25 DIAGNOSIS — F419 Anxiety disorder, unspecified: Secondary | ICD-10-CM | POA: Diagnosis present

## 2020-06-25 LAB — RESP PANEL BY RT-PCR (FLU A&B, COVID) ARPGX2
Influenza A by PCR: NEGATIVE
Influenza B by PCR: NEGATIVE
SARS Coronavirus 2 by RT PCR: NEGATIVE

## 2020-06-25 LAB — CBC WITH DIFFERENTIAL/PLATELET
Abs Immature Granulocytes: 0.01 10*3/uL (ref 0.00–0.07)
Basophils Absolute: 0 10*3/uL (ref 0.0–0.1)
Basophils Relative: 0 %
Eosinophils Absolute: 0 10*3/uL (ref 0.0–0.5)
Eosinophils Relative: 0 %
HCT: 48 % (ref 39.0–52.0)
Hemoglobin: 15.8 g/dL (ref 13.0–17.0)
Immature Granulocytes: 0 %
Lymphocytes Relative: 33 %
Lymphs Abs: 2.1 10*3/uL (ref 0.7–4.0)
MCH: 31.9 pg (ref 26.0–34.0)
MCHC: 32.9 g/dL (ref 30.0–36.0)
MCV: 97 fL (ref 80.0–100.0)
Monocytes Absolute: 0.7 10*3/uL (ref 0.1–1.0)
Monocytes Relative: 11 %
Neutro Abs: 3.6 10*3/uL (ref 1.7–7.7)
Neutrophils Relative %: 56 %
Platelets: 218 10*3/uL (ref 150–400)
RBC: 4.95 MIL/uL (ref 4.22–5.81)
RDW: 11.8 % (ref 11.5–15.5)
WBC: 6.4 10*3/uL (ref 4.0–10.5)
nRBC: 0 % (ref 0.0–0.2)

## 2020-06-25 LAB — COMPREHENSIVE METABOLIC PANEL
ALT: 39 U/L (ref 0–44)
AST: 24 U/L (ref 15–41)
Albumin: 4 g/dL (ref 3.5–5.0)
Alkaline Phosphatase: 75 U/L (ref 38–126)
Anion gap: 10 (ref 5–15)
BUN: 7 mg/dL (ref 6–20)
CO2: 24 mmol/L (ref 22–32)
Calcium: 8.9 mg/dL (ref 8.9–10.3)
Chloride: 105 mmol/L (ref 98–111)
Creatinine, Ser: 0.98 mg/dL (ref 0.61–1.24)
GFR, Estimated: >60 mL/min (ref >60)
Glucose, Bld: 91 mg/dL (ref 70–99)
Potassium: 3.4 mmol/L — ABNORMAL LOW (ref 3.5–5.1)
Sodium: 139 mmol/L (ref 135–145)
Total Bilirubin: 1.9 mg/dL — ABNORMAL HIGH (ref 0.3–1.2)
Total Protein: 6.9 g/dL (ref 6.5–8.1)

## 2020-06-25 MED ORDER — LEVETIRACETAM IN NACL 1000 MG/100ML IV SOLN
1000.0000 mg | Freq: Once | INTRAVENOUS | Status: AC
  Administered 2020-06-25: 1000 mg via INTRAVENOUS
  Filled 2020-06-25: qty 100

## 2020-06-25 MED ORDER — ONDANSETRON HCL 4 MG PO TABS: 4.0000 mg | ORAL_TABLET | Freq: Four times a day (QID) | ORAL | Status: DC | PRN

## 2020-06-25 MED ORDER — POTASSIUM CHLORIDE CRYS ER 20 MEQ PO TBCR
40.0000 meq | EXTENDED_RELEASE_TABLET | Freq: Once | ORAL | Status: AC
  Administered 2020-06-26: 40 meq via ORAL
  Filled 2020-06-25: qty 2

## 2020-06-25 MED ORDER — ONDANSETRON HCL 4 MG/2ML IJ SOLN: 4.0000 mg | Freq: Four times a day (QID) | INTRAMUSCULAR | Status: DC | PRN

## 2020-06-25 MED ORDER — ENOXAPARIN SODIUM 40 MG/0.4ML ~~LOC~~ SOLN
40.0000 mg | SUBCUTANEOUS | Status: DC
  Administered 2020-06-26 – 2020-06-27 (×2): 40 mg via SUBCUTANEOUS
  Filled 2020-06-25 (×2): qty 0.4

## 2020-06-25 MED ORDER — ACETAMINOPHEN 325 MG PO TABS: 650.0000 mg | ORAL_TABLET | ORAL | Status: DC | PRN

## 2020-06-25 MED ORDER — LEVETIRACETAM 500 MG PO TABS
500.0000 mg | ORAL_TABLET | Freq: Two times a day (BID) | ORAL | Status: DC
  Administered 2020-06-26 – 2020-06-27 (×3): 500 mg via ORAL
  Filled 2020-06-25 (×3): qty 1

## 2020-06-25 MED ORDER — LACTATED RINGERS IV BOLUS
1000.0000 mL | Freq: Once | INTRAVENOUS | Status: AC
  Administered 2020-06-25: 1000 mL via INTRAVENOUS

## 2020-06-25 MED ORDER — ACETAMINOPHEN 650 MG RE SUPP: 650.0000 mg | RECTAL | Status: DC | PRN

## 2020-06-25 NOTE — ED Notes (Signed)
While in a room with another pt, NT comes in and informs this RN that this pt having a seizure.  This RN went to room to evaluate pt. Pt resting. Two officers at bedside. Asking them about seizure activity. They respond that he has had two seizures since being here.  Made Dr Rubin Payor aware.

## 2020-06-25 NOTE — ED Triage Notes (Signed)
Per [facility] pt from jail for seizure. Was given Versed 2.5mg  via 20g in left hand by EMS in route. Pt was seen at [facility] for seizures few days ago as well. Pt was found down and hitting his head on the wall. Lasted approx 45 seconds. No incontinence or oral injury.  129/82, 100HR, 20R, 100% RA, CBG 119, 98.0 temp.  Pt reports missing a tooth on right upper posterior, unsure if had this morning or not.

## 2020-06-25 NOTE — ED Notes (Signed)
2 police officers at patients bedside

## 2020-06-25 NOTE — H&P (Signed)
History and Physical    Justin [name] ZOX:[id number] DOB: August 13, 1980 DOA: 06/25/2020  PCP: Patient, No Pcp Per  [place]  I have personally briefly reviewed patient's [facility]  Chief Complaint: Seizure-like activity  HPI: Justin [name] is a 39 y.o. male with medical history significant for HIV on Biktarvy, chronic hepatitis C, seizure disorder, depression/anxiety polysubstance use (cocaine, methamphetamine, THC) who presents to the ED for evaluation of seizure-like activity.  History is limited from patient due to inability to recall recent events and is otherwise obtained from EDP and chart review.  Patient reportedly has not been receiving Keppra while in jail.  He had seizure-like activity while in jail, 1 with EMS, and 3 more episodes while in the ED.  Patient states he does not recall any of his seizures today.  He says he has been on Keppra in the past but not sure the reason why he was started on it.  He says he has been taking Biktarvy regularly.  He says he is not taking any other medications.  He does admit to using methamphetamine intravenously with last use about 1 month ago.  He reports occasional alcohol use about once a week with last use 1 week ago.  ED Course:  Initial vitals showed BP 117/80, pulse 77, RR 17, temp 97.9 F, SPO2 99% on room air.  Labs show WBC 6.4, hemoglobin 15.8, platelets 218,000, sodium 139, potassium 3.4, bicarb 24, BUN 7, creatinine 0.98, serum glucose 91.  UDS is ordered and pending.  SARS-CoV-2 PCR is negative.  2 view chest x-ray is negative for focal consolidation, edema, or effusion.  CT head and cervical spine without contrast are negative for acute intracranial findings.  Patient was given 1 L LR and IV Keppra 1000 mg.  Per EDP patient had repeat seizure-like activity.  EDP discussed with on-call neurology, Dr. 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  Neurology recommended  [facility] and needs to be seizure-free for 24 hours before discharge.  If recurrent seizures occur then [facility] be [facility].  The hospitalist service was consulted to admit for further evaluation management.  Review of Systems: All systems reviewed and are negative except as documented in history of present illness above.   Past Medical History:  Diagnosis Date  . Endocarditis   . Hepatitis C   . History of syphilis 08/25/2014   Treated by [facility] 07-2014.  [name]. HIV (human immunodeficiency virus infection) (HCC)   . Seizures (HCC)     Past Surgical History:  Procedure Laterality Date  . hand surgery Right   . LUMBAR PUNCTURE  08/08/2019      . WRIST SURGERY      Social History:  reports that he has been smoking cigarettes. He has never used smokeless tobacco. He reports current alcohol use. He reports current drug use. Frequency: 14.00 times per week. Drugs: Marijuana and Methamphetamines.  No Known Allergies  Family History  Adopted: Yes  Family history unknown: Yes     Prior to Admission medications   Medication Sig Start Date End Date Taking? Authorizing Provider  ARIPiprazole (ABILIFY) 5 MG tablet Take 1 tablet (5 mg total) by mouth daily. Patient not taking: Reported on 02/13/2020 12/06/19   Terrilee FilesButler, Michael C, MD  bictegravir-emtricitabine-tenofovir AF (BIKTARVY) 50-200-25 MG TABS tablet Take 1 tablet by mouth daily. Patient not taking:  Reported on 03/22/2020 02/13/20   [name], MD  bictegravir-emtricitabine-tenofovir AF (BIKTARVY) 50-200-25 MG TABS tablet Take 1 tablet by mouth daily. Patient not taking: Reported on 04/22/2020 04/20/20   Oneta Rack, NP  FLUoxetine (PROZAC) 20 MG capsule Take 20 mg by mouth daily. Patient not taking: Reported on 04/11/2020 04/07/20   [provider]  FLUoxetine (PROZAC) 20 MG tablet Take 1 tablet (20 mg total) by  mouth daily. 06/21/20   Mannie Stabile, PA-C  levETIRAcetam (KEPPRA) 500 MG tablet Take 1 tablet (500 mg total) by mouth 2 (two) times daily. Patient not taking: Reported on 03/22/2020 02/13/20   [name], MD  levETIRAcetam (KEPPRA) 500 MG tablet Take 1 tablet (500 mg total) by mouth 2 (two) times daily. 06/21/20   Mannie Stabile, PA-C    Physical Exam: Vitals:   06/25/20 2115 06/25/20 2130 06/25/20 2200 06/25/20 2216  BP:  120/89 (!) 133/99   Pulse: 78 87 99 76  Resp: 10 12 19 10   Temp:      TempSrc:      SpO2: 98% 95% 99% 97%   Constitutional: NAD, calm, comfortable.  Handcuffs in place bilaterally, feet shackled to bed. Eyes: PERRL, lids and conjunctivae normal ENMT: Mucous membranes are moist. Posterior pharynx clear of any exudate or lesions.Normal dentition.  Neck: normal, supple, no masses. Respiratory: clear to auscultation anteriorly.  Normal respiratory effort. No accessory muscle use.  Cardiovascular: Regular rate and rhythm, no murmurs / rubs / gallops. No extremity edema. 2+ pedal pulses. Abdomen: Mild tenderness to very light palpation, no masses palpated. No hepatosplenomegaly.  Musculoskeletal: no clubbing / cyanosis. No joint deformity upper and lower extremities. No contractures. Normal muscle tone.  Skin: no rashes, lesions, ulcers. No induration Neurologic: CN 2-12 grossly intact. Sensation intact, Strength 5/5 in all 4.  Psychiatric: Normal judgment and insight. Alert and oriented x 3. Normal mood.   Labs on Admission: I have personally reviewed following labs and imaging studies  CBC: Recent Labs  Lab 06/21/20 1755 06/25/20 1642  WBC 5.3 6.4  NEUTROABS 2.4 3.6  HGB 15.7 15.8  HCT 48.3 48.0  MCV 97.2 97.0  PLT 159 218   Basic Metabolic Panel: Recent Labs  Lab 06/21/20 0049 06/21/20 1755 06/25/20 1915  NA 139 140 139  K 3.6 4.1 3.4*  CL 106 103 105  CO2 24 28 24   GLUCOSE 109* 83 91  BUN 12 9 7   CREATININE 1.03 1.13 0.98   CALCIUM 9.6 9.6 8.9   GFR: Estimated Creatinine Clearance: 123.8 mL/min (by C-G formula based on SCr of 0.98 mg/dL). Liver Function Tests: Recent Labs  Lab 06/21/20 0049 06/25/20 1915  AST 89* 24  ALT 45* 39  ALKPHOS 80 75  BILITOT 1.6* 1.9*  PROT 7.7 6.9  ALBUMIN 4.2 4.0   No results for input(s): LIPASE, AMYLASE in the last 168 hours. No results for input(s): AMMONIA in the last 168 hours. Coagulation Profile: No results for input(s): INR, PROTIME in the last 168 hours. Cardiac Enzymes: No results for input(s): CKTOTAL, CKMB, CKMBINDEX, TROPONINI in the last 168 hours. BNP (last 3 results) No results for input(s): PROBNP in the last 8760 hours. HbA1C: No results for input(s): HGBA1C in the last 72 hours. CBG: Recent Labs  Lab 06/21/20 1758  GLUCAP 73   Lipid Profile: No results for input(s): CHOL, HDL, LDLCALC, TRIG, CHOLHDL, LDLDIRECT in the last 72 hours. Thyroid Function Tests: No results for input(s): TSH, T4TOTAL, FREET4, T3FREE,  THYROIDAB in the last 72 hours. Anemia Panel: No results for input(s): VITAMINB12, FOLATE, FERRITIN, TIBC, IRON, RETICCTPCT in the last 72 hours. Urine analysis:    Component Value Date/Time   COLORURINE YELLOW 08/06/2019 1322   APPEARANCEUR CLEAR 08/06/2019 1322   LABSPEC 1.021 08/06/2019 1322   PHURINE 5.0 08/06/2019 1322   GLUCOSEU NEGATIVE 08/06/2019 1322   HGBUR NEGATIVE 08/06/2019 1322   BILIRUBINUR NEGATIVE 08/06/2019 1322   BILIRUBINUR negative 09/25/2014 1641   KETONESUR 80 (A) 08/06/2019 1322   PROTEINUR 100 (A) 08/06/2019 1322   UROBILINOGEN 4.0 09/25/2014 1641   UROBILINOGEN 0.2 08/19/2014 1152   NITRITE NEGATIVE 08/06/2019 1322   LEUKOCYTESUR NEGATIVE 08/06/2019 1322    Radiological Exams on Admission: DG Chest 2 View  Result Date: 06/25/2020 CLINICAL DATA:  Chest pain.  Seizures. EXAM: CHEST - 2 VIEW COMPARISON:  April 22, 2020. FINDINGS: The heart size and mediastinal contours are within normal limits.  Both lungs are clear. No visible pleural effusions or pneumothorax. No acute osseous abnormality. IMPRESSION: No active cardiopulmonary disease. Electronically Signed   By: Feliberto Harts MD   On: 06/25/2020 16:09   CT Head Wo Contrast  Result Date: 06/25/2020 CLINICAL DATA:  Seizures and fall. EXAM: CT HEAD WITHOUT CONTRAST TECHNIQUE: Contiguous axial images were obtained from the base of the skull through the vertex without intravenous contrast. COMPARISON:  04/22/2020 FINDINGS: Brain: The brainstem, cerebellum, cerebral peduncles, thalami, basal ganglia, basilar cisterns, and ventricular system appear within normal limits. No intracranial hemorrhage, mass lesion, or acute CVA. Vascular: Unremarkable Skull: Unremarkable Sinuses/Orbits: Unremarkable Other: Scalp edema or subcutaneous hematoma along the left posterior vertex for example on image 6 of series 10, but not appreciably changed from 04/22/2020. IMPRESSION: 1. No acute intracranial findings. 2. Scalp edema or subcutaneous hematoma along the left posterior vertex, but not appreciably changed from 04/22/2020. Electronically Signed   By: Gaylyn Rong M.D.   On: 06/25/2020 16:55   CT Cervical Spine Wo Contrast  Result Date: 06/25/2020 CLINICAL DATA:  Seizure, found down EXAM: CT CERVICAL SPINE WITHOUT CONTRAST TECHNIQUE: Multidetector CT imaging of the cervical spine was performed without intravenous contrast. Multiplanar CT image reconstructions were also generated. COMPARISON:  04/22/2020 FINDINGS: Alignment: Stable reversal of cervical lordosis due to lower cervical spondylosis. Skull base and vertebrae: No acute fracture. No primary bone lesion or focal pathologic process. Soft tissues and spinal canal: No prevertebral fluid or swelling. No visible canal hematoma. Disc levels:  Stable spondylosis at the C5/C6 level. Upper chest: Airway is patent. Emphysematous changes are seen at the lung apices, stable. Other: Reconstructed images  demonstrate no additional findings. IMPRESSION: 1. No acute cervical spine fracture.  Stable spondylosis. Electronically Signed   By: Sharlet Salina M.D.   On: 06/25/2020 16:30    EKG: Personally reviewed. Normal sinus rhythm without acute ischemic changes.  Assessment/Plan Principal Problem:   Seizure-like activity (HCC) Active Problems:   Human immunodeficiency virus (HIV) disease (HCC)   Substance use disorder  Justin [name] is a 39 y.o. male with medical history significant for HIV on Biktarvy, chronic hepatitis C, seizure disorder, depression/anxiety polysubstance use (cocaine, methamphetamine, THC, EtOH) who is admitted for evaluation of seizure-like activity.  Seizure-like activity: Patient presenting with reported seizure-like activity.  EDP discussed with on-call neurology who recommended resuming home Keppra and discharged to home when seizure-free for 24 hours.  If having recurrent seizure-like activity then will need to transfer to [facility] for formal neurology consult and potential continuous EEG monitoring.  Patient previously on Keppra however reportedly has not received this while he has been in jail.  Also has seizure triggers with alcohol and drug use as well as potential secondary gain. -Resume Keppra 500 mg twice daily -Continue seizure precautions  HIV: Follows with ID, Dr. [name].  Continue Biktarvy.  Substance use disorder: Reports using methamphetamine intravenously, last use a month ago.  Also with previous cocaine, THC, and occasional alcohol use.  UDS is pending.  DVT prophylaxis: Lovenox Code Status: Full code, confirmed with patient Family Communication: Discussed with patient Disposition Plan: From jail and likely return to jail Consults called: EDP discussed with on-call neurology, not formally consulted at this time Admission status:  Status is: Observation  The patient remains OBS appropriate and will d/c before 2 midnights.  Dispo:  The patient is from: [place]              Anticipated d/c is to: [place]              Anticipated d/c date is: 1 day              Patient currently is not medically stable to d/c.   Darreld Mclean MD [facility]  If 7PM-7AM, please contact night-coverage [email or web address]  06/25/2020, 10:57 PM

## 2020-06-25 NOTE — ED Provider Notes (Signed)
[facility]-EMERGENCY DEPT Provider Note   CSN: [id number] Arrival date & time: 06/25/20  1359     History Chief Complaint  Patient presents with  . Seizures    Justin [name] is a 39 y.o. male with a past medical history of HIV, methamphetamine use, hepatitis, who presents today for evaluation of recurrent seizures.  Patient has been in custody for the past 4 days.  He was seen in the emergency room twice on 12/19 for seizures.  At least the second time he was in custody, chart review shows that at that point he was given prescriptions for Prozac and Keppra and discharged back to jail. The meds list given to me by officers indicates that he has been getting Biktarvy however does not appear to have been getting Keppra or other medications.  While I was in the room the office or present called jail medical staff who confirmed that patient has not been getting his Keppra since his previous ER visit for seizures.  They and I reviewed his discharge papers from last time and they state that they can give him his Keppra.  Patient had at least one seizure today at the jail, 1 with EMS and 3 since arrival in the emergency room.  Unknown if he struck his head or fell during seizure at the jail however he arrives with c-collar in place.  He reports pain in his head, neck, chest and back.  No cough or shortness of breath.  He denies any frequent alcohol or benzodiazepine use, no history of withdrawal seizures.   Patient does not recall any of his seizures today.  He is tearful.  Level 5 caveat for confusion  HPI     Past Medical History:  Diagnosis Date  . Endocarditis   . Hepatitis C   . History of syphilis 08/25/2014   Treated by [facility] 07-2014.  [name] HIV (human immunodeficiency virus infection) (HCC)   . Seizures [facility])     Patient Active Problem List   Diagnosis Date Noted  . Seizure-like activity (HCC) 06/25/2020  . Severe episode of recurrent major depressive  disorder, with psychotic features (HCC)   . Amphetamine and psychostimulant-induced psychosis with delusions (HCC)   . Methamphetamine use disorder, severe, dependence (HCC) 03/23/2020  . 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 Acute psychosis (HCC)   . Substance use disorder 08/07/2019  . Encephalopathy 08/06/2019  . Chronic hepatitis C without hepatic coma (HCC) 08/17/2017  . Screening examination for venereal disease 05/03/2017  . Encounter for long-term (current) use of high-risk medication 05/03/2017  . Anxiety 09/26/2016  . Substance abuse (HCC) 09/26/2016  . Human immunodeficiency virus (HIV) disease (HCC) 09/02/2014  . History of syphilis 08/25/2014    Past Surgical History:  Procedure Laterality Date  . hand surgery Right   . LUMBAR PUNCTURE  08/08/2019      . WRIST SURGERY         Family History  Adopted: Yes  Family history unknown: Yes    Social History   Tobacco Use  . Smoking status: Current Every Day Smoker    Types: Cigarettes  . Smokeless tobacco: Never Used  Vaping Use  . Vaping Use: Never used  Substance Use Topics  . Alcohol use: Yes    Comment: 1-2 a week   . Drug use: Yes    Frequency: 14.0 times per week    Types: Marijuana, Methamphetamines    Home Medications Prior to Admission medications   Medication  Sig Start Date End Date Taking? Authorizing Provider  ARIPiprazole (ABILIFY) 5 MG tablet Take 1 tablet (5 mg total) by mouth daily. Patient not taking: Reported on 02/13/2020 12/06/19   [name], MD  bictegravir-emtricitabine-tenofovir AF (BIKTARVY) 50-200-25 MG TABS tablet Take 1 tablet by mouth daily. Patient not taking: Reported on 03/22/2020 02/13/20   [name], MD  bictegravir-emtricitabine-tenofovir AF (BIKTARVY) 50-200-25 MG TABS tablet Take 1 tablet by mouth daily. Patient not taking: Reported on 04/22/2020 04/20/20   Oneta Rack, NP  FLUoxetine (PROZAC) 20 MG capsule Take 20 mg by mouth  daily. Patient not taking: Reported on 04/11/2020 04/07/20   [provider]  FLUoxetine (PROZAC) 20 MG tablet Take 1 tablet (20 mg total) by mouth daily. 06/21/20   Mannie Stabile, PA-C  levETIRAcetam (KEPPRA) 500 MG tablet Take 1 tablet (500 mg total) by mouth 2 (two) times daily. Patient not taking: Reported on 03/22/2020 02/13/20   [name], MD  levETIRAcetam (KEPPRA) 500 MG tablet Take 1 tablet (500 mg total) by mouth 2 (two) times daily. 06/21/20   Mannie Stabile, PA-C    Allergies    Patient has no known allergies.  Review of Systems   Review of Systems  Unable to perform ROS: Mental status change    Physical Exam Updated Vital Signs BP 117/89   Pulse 77   Temp 97.9 F (36.6 C) (Oral)   Resp 11   SpO2 98%   Physical Exam Vitals and nursing note reviewed.  Constitutional:      General: He is not in acute distress.    Appearance: He is not diaphoretic.     Comments: Shackled to bed by officers with handcuffs on bilaterally  HENT:     Head: Normocephalic and atraumatic.  Eyes:     General: No scleral icterus.       Right eye: No discharge.        Left eye: No discharge.     Conjunctiva/sclera: Conjunctivae normal.  Neck:     Comments: C-collar in place Cardiovascular:     Rate and Rhythm: Normal rate and regular rhythm.     Pulses: Normal pulses.     Heart sounds: Normal heart sounds.  Pulmonary:     Effort: Pulmonary effort is normal. No respiratory distress.     Breath sounds: Normal breath sounds. No stridor.  Abdominal:     General: There is no distension.     Tenderness: There is no abdominal tenderness. There is no guarding.  Musculoskeletal:        General: No deformity.     Right lower leg: No edema.     Left lower leg: No edema.  Skin:    General: Skin is warm and dry.  Neurological:     Mental Status: He is alert.     Motor: No abnormal muscle tone.     Comments: Patient is awake and alert, he is oriented to person, place,  and time.  He does not remember events leading up to him being in the ER today.  He reports that the left side of his body feels weird and weak.  He says he has never had this type of seizure before. On my exam he has global poor effort and is very tearful when attempting to assess strength.  Psychiatric:     Comments: Labile, tearful     ED Results / Procedures / Treatments   Labs (all labs ordered are listed, but  only abnormal results are displayed) Labs Reviewed  COMPREHENSIVE METABOLIC PANEL - Abnormal; Notable for the following components:      Result Value   Potassium 3.4 (*)    Total Bilirubin 1.9 (*)    All other components within normal limits  RESP PANEL BY RT-PCR (FLU A&B, COVID) ARPGX2  CBC WITH DIFFERENTIAL/PLATELET  RAPID URINE DRUG SCREEN, HOSP PERFORMED    EKG EKG Interpretation  Date/Time:  Thursday June 25 2020 14:47:57 EST Ventricular Rate:  80 PR Interval:    QRS Duration: 104 QT Interval:  379 QTC Calculation: 438 R Axis:   24 Text Interpretation: Sinus rhythm similar to Oct 2021 Confirmed by Pricilla Loveless [phone]) on 06/25/2020 5:28:30 PM   Radiology DG Chest 2 View  Result Date: 06/25/2020 CLINICAL DATA:  Chest pain.  Seizures. EXAM: CHEST - 2 VIEW COMPARISON:  April 22, 2020. FINDINGS: The heart size and mediastinal contours are within normal limits. Both lungs are clear. No visible pleural effusions or pneumothorax. No acute osseous abnormality. IMPRESSION: No active cardiopulmonary disease. Electronically Signed   By: Feliberto Harts MD   On: 06/25/2020 16:09   CT Head Wo Contrast  Result Date: 06/25/2020 CLINICAL DATA:  Seizures and fall. EXAM: CT HEAD WITHOUT CONTRAST TECHNIQUE: Contiguous axial images were obtained from the base of the skull through the vertex without intravenous contrast. COMPARISON:  04/22/2020 FINDINGS: Brain: The brainstem, cerebellum, cerebral peduncles, thalami, basal ganglia, basilar cisterns, and ventricular  system appear within normal limits. No intracranial hemorrhage, mass lesion, or acute CVA. Vascular: Unremarkable Skull: Unremarkable Sinuses/Orbits: Unremarkable Other: Scalp edema or subcutaneous hematoma along the left posterior vertex for example on image 6 of series 10, but not appreciably changed from 04/22/2020. IMPRESSION: 1. No acute intracranial findings. 2. Scalp edema or subcutaneous hematoma along the left posterior vertex, but not appreciably changed from 04/22/2020. Electronically Signed   By: Gaylyn Rong M.D.   On: 06/25/2020 16:55   CT Cervical Spine Wo Contrast  Result Date: 06/25/2020 CLINICAL DATA:  Seizure, found down EXAM: CT CERVICAL SPINE WITHOUT CONTRAST TECHNIQUE: Multidetector CT imaging of the cervical spine was performed without intravenous contrast. Multiplanar CT image reconstructions were also generated. COMPARISON:  04/22/2020 FINDINGS: Alignment: Stable reversal of cervical lordosis due to lower cervical spondylosis. Skull base and vertebrae: No acute fracture. No primary bone lesion or focal pathologic process. Soft tissues and spinal canal: No prevertebral fluid or swelling. No visible canal hematoma. Disc levels:  Stable spondylosis at the C5/C6 level. Upper chest: Airway is patent. Emphysematous changes are seen at the lung apices, stable. Other: Reconstructed images demonstrate no additional findings. IMPRESSION: 1. No acute cervical spine fracture.  Stable spondylosis. Electronically Signed   By: Sharlet Salina M.D.   On: 06/25/2020 16:30    Procedures Procedures (including critical care time)  Medications Ordered in ED Medications  enoxaparin (LOVENOX) injection 40 mg (has no administration in time range)  acetaminophen (TYLENOL) tablet 650 mg (has no administration in time range)    Or  acetaminophen (TYLENOL) suppository 650 mg (has no administration in time range)  ondansetron (ZOFRAN) tablet 4 mg (has no administration in time range)    Or   ondansetron (ZOFRAN) injection 4 mg (has no administration in time range)  levETIRAcetam (KEPPRA) IVPB 1000 mg/100 mL premix (0 mg Intravenous Stopped 06/25/20 1655)  lactated ringers bolus 1,000 mL (0 mLs Intravenous Stopped 06/25/20 2132)  lactated ringers bolus 1,000 mL (1,000 mLs Intravenous New Bag/Given 06/25/20 2141)    ED  Course  I have reviewed the triage vital signs and the nursing notes.  Pertinent labs & imaging results that were available during my care of the patient were reviewed by me and considered in my medical decision making (see chart for details).  Clinical Course as of 06/25/20 2335  Thu Jun 25, 2020  1918 Patient reevaluated, he has not had recurrent seizures since he got Keppra.  He still feels like he has difficulty moving his left side.  He is tearful and expresses to me that he wants to stay and be watched for 24 hours.  I explained to him that due to the many patients in the waiting room we cannot hold him beyond when he is medically clear, he tearfully states his understanding.  I reiterated that I had heard the office or discussion with medical staff that patient will be able to get his Keppra.  C-collar is removed, encourage patient to start moving around in bed to try and help with stiffness. [EH]  2023 Patient had reportedly had a seizure.  [EH]  2058 I spoke with neurology Dr. Derry LoryKhaliqdina of neurology who requests I load him with a second gram of keppra to make two grams, feels like he can stay at [facility].  Keep keppra dose unchanged.  Needs to be seizure free for 24 hours before discharge.  [EH]    Clinical Course User Index [EH] Norman ClayHammond, Edwena Mayorga W, PA-C   MDM Rules/Calculators/A&P                          Patient is a 39 year old man who presents today for evaluation of seizure-like activity.  He has seizures, and [facility] and [facility].  He has been incarcerated for the past 4 days.  Med rec from jail shows that he has only been getting  Biktarvy, not his Keppra.  Officer at bedside spoke with medical on the phone while I was in the room who states that they, on further review of the discharge papers see that he is supposed to be getting Keppra and will be able to give that to him. Patient was loaded with 1 g of Keppra initially.  CT head and neck was obtained without acute abnormalities.  CBC and CMP are unremarkable.  No evidence of infection.  No evidence of meningitis.  Flu and Covid testing is negative.  After patient was loaded with Keppra he and I discussed that he could not simply stay here for 24 hours instead of going back to jail.  About half an hour after this he reportedly had a seizure.  I did not personally witness a seizure.  I spoke with neurology who recommended that I load him with a additional gram of Keppra to make 2 g, recommended admission to the hospital for observation, that patient needs to be seizure-free for 24 hours prior to discharge.  I spoke with hospitalist Dr. Allena KatzPatel who will see patient for admission.   Final Clinical Impression(s) / ED Diagnoses Final diagnoses:  Seizure [facility](HCC)    Rx / DC Orders ED Discharge Orders    None       Norman ClayHammond, Maelin Kurkowski W, PA-C 06/25/20 2338    Pricilla LovelessGoldston, Scott, MD 06/26/20 1459

## 2020-06-25 NOTE — ED Notes (Signed)
Police officers at bedside (2)

## 2020-06-25 NOTE — ED Notes (Signed)
Seizure pads (L) (R) attached to bed

## 2020-06-25 NOTE — ED Notes (Signed)
(  2) police officers at bedside

## 2020-06-26 DIAGNOSIS — F17210 Nicotine dependence, cigarettes, uncomplicated: Secondary | ICD-10-CM | POA: Diagnosis present

## 2020-06-26 DIAGNOSIS — G40909 Epilepsy, unspecified, not intractable, without status epilepticus: Secondary | ICD-10-CM | POA: Diagnosis present

## 2020-06-26 DIAGNOSIS — R569 Unspecified convulsions: Secondary | ICD-10-CM

## 2020-06-26 DIAGNOSIS — B182 Chronic viral hepatitis C: Secondary | ICD-10-CM | POA: Diagnosis present

## 2020-06-26 DIAGNOSIS — F419 Anxiety disorder, unspecified: Secondary | ICD-10-CM | POA: Diagnosis present

## 2020-06-26 DIAGNOSIS — B20 Human immunodeficiency virus [HIV] disease: Secondary | ICD-10-CM

## 2020-06-26 DIAGNOSIS — F1990 Other psychoactive substance use, unspecified, uncomplicated: Secondary | ICD-10-CM | POA: Diagnosis not present

## 2020-06-26 DIAGNOSIS — Z20822 Contact with and (suspected) exposure to covid-19: Secondary | ICD-10-CM | POA: Diagnosis present

## 2020-06-26 DIAGNOSIS — Z79899 Other long term (current) drug therapy: Secondary | ICD-10-CM | POA: Diagnosis not present

## 2020-06-26 DIAGNOSIS — F1590 Other stimulant use, unspecified, uncomplicated: Secondary | ICD-10-CM | POA: Diagnosis present

## 2020-06-26 LAB — RAPID URINE DRUG SCREEN, HOSP PERFORMED
Amphetamines: NOT DETECTED
Barbiturates: NOT DETECTED
Benzodiazepines: POSITIVE — AB
Cocaine: NOT DETECTED
Opiates: NOT DETECTED
Tetrahydrocannabinol: NOT DETECTED

## 2020-06-26 MED ORDER — BICTEGRAVIR-EMTRICITAB-TENOFOV 50-200-25 MG PO TABS
1.0000 | ORAL_TABLET | Freq: Every day | ORAL | Status: DC
  Administered 2020-06-26 – 2020-06-27 (×2): 1 via ORAL
  Filled 2020-06-26 (×2): qty 1

## 2020-06-26 MED ORDER — MELATONIN 3 MG PO TABS
3.0000 mg | ORAL_TABLET | Freq: Once | ORAL | Status: AC
  Administered 2020-06-26: 3 mg via ORAL
  Filled 2020-06-26: qty 1

## 2020-06-26 MED ORDER — TRAZODONE HCL 50 MG PO TABS
50.0000 mg | ORAL_TABLET | Freq: Every evening | ORAL | Status: DC | PRN
  Administered 2020-06-26: 50 mg via ORAL
  Filled 2020-06-26: qty 1

## 2020-06-26 NOTE — ED Notes (Signed)
Pt has a condom catheter place with no urine output for sample requested at this time.

## 2020-06-26 NOTE — Progress Notes (Signed)
Patient ID: Justin [name], male   DOB: 1980/08/09, 39 y.o.   MRN: [id number]  PROGRESS NOTE    Justin [name]  XHB:[id number] DOB: Nov 12, 1980 DOA: 06/25/2020 PCP: Patient, No Pcp Per   Brief Narrative:  39 y.o. male with medical history significant for HIV on Biktarvy, chronic hepatitis C, seizure disorder, depression/anxiety polysubstance use (cocaine, methamphetamine, THC) presented for evaluation of seizure-like activity; apparently he was not receiving Keppra while in jail.  He had seizure-like activity while in jail, 1 with EMS, and 3 more episodes while in the ED. on presentation, his vitals were stable.  Chest x-ray was negative for infiltrates.  CT of the head and cervical spine without contrast were negative for acute intracranial findings.  He was started on IV Keppra.  ED provider discussed with on-call neurology/Dr. 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  Neurology recommended patient may remain at [facility] for continued observation and needs to be seizure-free for 24 hours before discharge.  If recurrent seizures occur then he should be transferred to [facility] for continuous EEG monitoring.   Assessment & Plan:   Seizures -Patient presenting with reported seizure-like activity.  EDP discussed with on-call neurology who recommended resuming home Keppra and discharged to home when seizure-free for 24 hours.  If having recurrent seizure-like activity then will need to transfer to [facility] for formal neurology consult and potential continuous EEG monitoring.  Patient previously on Keppra however reportedly has not received this while he has been in jail.  Also has seizure triggers with alcohol and drug use as well as potential secondary gain. -Continue Keppra 500 mg twice a day and as needed Ativan.  Seizure prophylaxis.  If no seizure-like activities for today, will discharge patient tomorrow with outpatient follow-up with  neurology  HIV -Outpatient follow-up with Dr. Comer/ID.  Continue Biktarvy  Substance use disorder -Reports using methamphetamine intravenously, last use a month ago.  Also with previous cocaine, THC, and occasional alcohol use.  UDS is positive for benzodiazepines.    DVT prophylaxis: Lovenox Code Status: Full Family Communication: None at bedside Disposition Plan: Status is: Observation  The patient will require care spanning > 2 midnights and should be moved to inpatient because: Inpatient level of care appropriate due to severity of illness  Dispo: The patient is from: [place]              Anticipated d/c is to: [place]              Anticipated d/c date is: 1 day              Patient currently is not medically stable to d/c.  Consultants:  EDP discussed with on-call neurology  Procedures: None  Antimicrobials: Biktarvy   Subjective: Patient seen and examined at bedside.  Denies current headache, nausea or vomiting.  No fever or vomiting reported.  No seizure-like activity since admission.  Objective: Vitals:   06/26/20 0730 06/26/20 0800 06/26/20 0815 06/26/20 0906  BP: 104/87 103/88  113/74  Pulse: 75 72  65  Resp: 13  17 20   Temp:    98 F (36.7 C)  TempSrc:    Oral  SpO2: 99% 94%  96%    Intake/Output Summary (Last 24 hours) at 06/26/2020 1107 Last data filed at 06/25/2020 2132 Gross per 24 hour  Intake 1090.27 ml  Output --  Net 1090.27 ml   There were no vitals filed for this visit.  Examination:  General exam: Appears calm and comfortable  Respiratory system: Bilateral decreased breath sounds at bases Cardiovascular system: S1 & S2 heard, Rate controlled Gastrointestinal system: Abdomen is nondistended, soft and nontender. Normal bowel sounds heard. Extremities: No cyanosis, clubbing, edema  Central nervous system: Alert and oriented. No focal neurological deficits. Moving extremities Skin: No rashes, lesions or ulcers Psychiatry: Judgement and  insight appear normal. Mood & affect appropriate.     Data Reviewed: I have personally reviewed following labs and imaging studies  CBC: Recent Labs  Lab 06/21/20 1755 06/25/20 1642  WBC 5.3 6.4  NEUTROABS 2.4 3.6  HGB 15.7 15.8  HCT 48.3 48.0  MCV 97.2 97.0  PLT 159 218   Basic Metabolic Panel: Recent Labs  Lab 06/21/20 0049 06/21/20 1755 06/25/20 1915  NA 139 140 139  K 3.6 4.1 3.4*  CL 106 103 105  CO2 24 28 24   GLUCOSE 109* 83 91  BUN 12 9 7   CREATININE 1.03 1.13 0.98  CALCIUM 9.6 9.6 8.9   GFR: Estimated Creatinine Clearance: 123.8 mL/min (by C-G formula based on SCr of 0.98 mg/dL). Liver Function Tests: Recent Labs  Lab 06/21/20 0049 06/25/20 1915  AST 89* 24  ALT 45* 39  ALKPHOS 80 75  BILITOT 1.6* 1.9*  PROT 7.7 6.9  ALBUMIN 4.2 4.0   No results for input(s): LIPASE, AMYLASE in the last 168 hours. No results for input(s): AMMONIA in the last 168 hours. Coagulation Profile: No results for input(s): INR, PROTIME in the last 168 hours. Cardiac Enzymes: No results for input(s): CKTOTAL, CKMB, CKMBINDEX, TROPONINI in the last 168 hours. BNP (last 3 results) No results for input(s): PROBNP in the last 8760 hours. HbA1C: No results for input(s): HGBA1C in the last 72 hours. CBG: Recent Labs  Lab 06/21/20 1758  GLUCAP 73   Lipid Profile: No results for input(s): CHOL, HDL, LDLCALC, TRIG, CHOLHDL, LDLDIRECT in the last 72 hours. Thyroid Function Tests: No results for input(s): TSH, T4TOTAL, FREET4, T3FREE, THYROIDAB in the last 72 hours. Anemia Panel: No results for input(s): VITAMINB12, FOLATE, FERRITIN, TIBC, IRON, RETICCTPCT in the last 72 hours. Sepsis Labs: No results for input(s): PROCALCITON, LATICACIDVEN in the last 168 hours.  Recent Results (from the past 240 hour(s))  Resp Panel by RT-PCR (Flu A&B, Covid) Nasopharyngeal Swab     Status: None   Collection Time: 06/25/20  9:43 PM   Specimen: Nasopharyngeal Swab; Nasopharyngeal(NP)  swabs in vial transport medium  Result Value Ref Range Status   SARS Coronavirus 2 by RT PCR NEGATIVE NEGATIVE Final    Comment: (NOTE) SARS-CoV-2 target nucleic acids are NOT DETECTED.  The SARS-CoV-2 RNA is generally detectable in upper respiratory specimens during the acute phase of infection. The lowest concentration of SARS-CoV-2 viral copies this assay can detect is 138 copies/mL. A negative result does not preclude SARS-Cov-2 infection and should not be used as the sole basis for treatment or other patient management decisions. A negative result may occur with  improper specimen collection/handling, submission of specimen other than nasopharyngeal swab, presence of viral mutation(s) within the areas targeted by this assay, and inadequate number of viral copies(<138 copies/mL). A negative result must be combined with clinical observations, patient history, and epidemiological information. The expected result is Negative.  Fact Sheet for Patients:  BloggerCourse.comhttps://www.fda.gov/media/152166/download  Fact Sheet for Healthcare Providers:  SeriousBroker.ithttps://www.fda.gov/media/152162/download  This test is no t yet approved or cleared by the [place] FDA and  has been authorized for detection and/or diagnosis of  SARS-CoV-2 by FDA under an Emergency Use Authorization (EUA). This EUA will remain  in effect (meaning this test can be used) for the duration of the COVID-19 declaration under Section 564(b)(1) of the Act, 21 U.S.C.section 360bbb-3(b)(1), unless the authorization is terminated  or revoked sooner.       Influenza A by PCR NEGATIVE NEGATIVE Final   Influenza B by PCR NEGATIVE NEGATIVE Final    Comment: (NOTE) The Xpert Xpress SARS-CoV-2/FLU/RSV plus assay is intended as an aid in the diagnosis of influenza from Nasopharyngeal swab specimens and should not be used as a sole basis for treatment. Nasal washings and aspirates are unacceptable for Xpert Xpress  SARS-CoV-2/FLU/RSV testing.  Fact Sheet for Patients: BloggerCourse.com  Fact Sheet for Healthcare Providers: SeriousBroker.it  This test is not yet approved or cleared by the [place] FDA and has been authorized for detection and/or diagnosis of SARS-CoV-2 by FDA under an Emergency Use Authorization (EUA). This EUA will remain in effect (meaning this test can be used) for the duration of the COVID-19 declaration under Section 564(b)(1) of the Act, 21 U.S.C. section 360bbb-3(b)(1), unless the authorization is terminated or revoked.  Performed at [facility], [address] [place], [place] [address]          Radiology Studies: DG Chest 2 View  Result Date: 06/25/2020 CLINICAL DATA:  Chest pain.  Seizures. EXAM: CHEST - 2 VIEW COMPARISON:  April 22, 2020. FINDINGS: The heart size and mediastinal contours are within normal limits. Both lungs are clear. No visible pleural effusions or pneumothorax. No acute osseous abnormality. IMPRESSION: No active cardiopulmonary disease. Electronically Signed   By: Feliberto Harts MD   On: 06/25/2020 16:09   CT Head Wo Contrast  Result Date: 06/25/2020 CLINICAL DATA:  Seizures and fall. EXAM: CT HEAD WITHOUT CONTRAST TECHNIQUE: Contiguous axial images were obtained from the base of the skull through the vertex without intravenous contrast. COMPARISON:  04/22/2020 FINDINGS: Brain: The brainstem, cerebellum, cerebral peduncles, thalami, basal ganglia, basilar cisterns, and ventricular system appear within normal limits. No intracranial hemorrhage, mass lesion, or acute CVA. Vascular: Unremarkable Skull: Unremarkable Sinuses/Orbits: Unremarkable Other: Scalp edema or subcutaneous hematoma along the left posterior vertex for example on image 6 of series 10, but not appreciably changed from 04/22/2020. IMPRESSION: 1. No acute intracranial findings. 2. Scalp edema or  subcutaneous hematoma along the left posterior vertex, but not appreciably changed from 04/22/2020. Electronically Signed   By: Gaylyn Rong M.D.   On: 06/25/2020 16:55   CT Cervical Spine Wo Contrast  Result Date: 06/25/2020 CLINICAL DATA:  Seizure, found down EXAM: CT CERVICAL SPINE WITHOUT CONTRAST TECHNIQUE: Multidetector CT imaging of the cervical spine was performed without intravenous contrast. Multiplanar CT image reconstructions were also generated. COMPARISON:  04/22/2020 FINDINGS: Alignment: Stable reversal of cervical lordosis due to lower cervical spondylosis. Skull base and vertebrae: No acute fracture. No primary bone lesion or focal pathologic process. Soft tissues and spinal canal: No prevertebral fluid or swelling. No visible canal hematoma. Disc levels:  Stable spondylosis at the C5/C6 level. Upper chest: Airway is patent. Emphysematous changes are seen at the lung apices, stable. Other: Reconstructed images demonstrate no additional findings. IMPRESSION: 1. No acute cervical spine fracture.  Stable spondylosis. Electronically Signed   By: Sharlet Salina M.D.   On: 06/25/2020 16:30        Scheduled Meds: . bictegravir-emtricitabine-tenofovir AF  1 tablet Oral Daily  . enoxaparin (LOVENOX) injection  40 mg Subcutaneous Q24H  .  levETIRAcetam  500 mg Oral BID   Continuous Infusions:        Glade Lloyd, MD [facility] 06/26/2020, 11:07 AM

## 2020-06-27 DIAGNOSIS — B20 Human immunodeficiency virus [HIV] disease: Secondary | ICD-10-CM | POA: Diagnosis not present

## 2020-06-27 DIAGNOSIS — F1990 Other psychoactive substance use, unspecified, uncomplicated: Secondary | ICD-10-CM | POA: Diagnosis not present

## 2020-06-27 DIAGNOSIS — R569 Unspecified convulsions: Secondary | ICD-10-CM | POA: Diagnosis not present

## 2020-06-27 MED ORDER — LEVETIRACETAM 500 MG PO TABS: 500.0000 mg | ORAL_TABLET | Freq: Two times a day (BID) | ORAL | 0 refills | Status: DC

## 2020-06-27 NOTE — Progress Notes (Signed)
AVS given to patient and explained at the bedside. Medications and follow up appointments have been explained with pt verbalizing understanding. Attending officers made aware of discharge and all questions have been answered with the officers verbalizing understanding.

## 2020-06-27 NOTE — Discharge Summary (Signed)
Physician Discharge Summary  Justin [name] ENI:[id number] DOB: 1981/02/22 DOA: 06/25/2020  PCP: Patient, No Pcp Per  Admit date: 06/25/2020 Discharge date: 06/27/2020  Admitted From: Jail Disposition: Jail  Recommendations for Outpatient Follow-up:  1. Follow up with PCP in 1 week  2. Outpatient follow-up with neurology, ID and psychiatry 3. Comply with medications and follow-up 4. Follow up in ED if symptoms worsen or new appear   Home Health: No Equipment/Devices: None  Discharge Condition: Stable CODE STATUS: Full Diet recommendation: Regular  Brief/Interim Summary: 39 y.o.malewith medical history significant forHIV on Biktarvy, chronic hepatitis C, seizure disorder,depression/anxietypolysubstance use (cocaine, methamphetamine, THC) presented for evaluation of seizure-like activity; apparently he was not receiving Keppra while in jail. He had seizure-like activity while in jail, 1 with EMS, and 3 more episodes while in the ED. on presentation, his vitals were stable.  Chest x-ray was negative for infiltrates.  CT of the head and cervical spine without contrast were negative for acute intracranial findings.  He was started on IV Keppra.  ED provider discussed with on-call neurology/Dr. 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 Neurology recommended patient may remain at [facility] for continued observation and needs to be seizure-free for 24 hours before discharge. If recurrent seizures occur then he should be transferred to [name] for continuous EEG monitoring.  During the hospitalization, he has had no more seizures. He is hemodynamically stable with stable mental status. He will be discharged today on oral Keppra.  Discharge Diagnoses:   Seizures -Patient presenting with reported seizure-like activity. EDP discussed with on-call neurology who recommended resuming home Keppra and discharged to home when seizure-free for 24 hours. If having  recurrent seizure-like activity then will need to transfer to [facility] for formal neurology consult and potential continuous EEG monitoring. Patient previously on Keppra however reportedly has not received this while he has been in jail. Also has seizure triggers with alcohol and drug use as well as potential secondary gain. -During the hospitalization, he has had no more seizures. He is hemodynamically stable with stable mental status. He will be discharged today on oral Keppra 500 mg twice a day.  HIV -Outpatient follow-up with Dr. Comer/ID.  Continue Biktarvy  Substance use disorder -Reports using methamphetamine intravenously, last use a month ago. Also with previous cocaine, THC, and occasional alcohol use. UDS is positive for benzodiazepines. -Outpatient follow-up with psychiatry for follow-up of methamphetamine related mood disorder.  Discharge Instructions  Discharge Instructions    Ambulatory referral to Infectious Disease   Complete by: As directed    Ambulatory referral to Neurology   Complete by: As directed    An appointment is requested in approximately:1-2weeks for seizures   Ambulatory referral to Psychiatry   Complete by: As directed    followup     Allergies as of 06/27/2020   No Known Allergies     Medication List    STOP taking these medications   ARIPiprazole 5 MG tablet Commonly known as: Abilify     TAKE these medications   Biktarvy 50-200-25 MG Tabs tablet Generic drug: bictegravir-emtricitabine-tenofovir AF Take 1 tablet by mouth daily. What changed: Another medication with the same name was removed. Continue taking this medication, and follow the directions you see here.   FLUoxetine 20 MG tablet Commonly known as: PROZAC Take 1 tablet (20 mg total) by mouth daily.   levETIRAcetam 500 MG tablet Commonly known as: KEPPRA Take 1 tablet (500 mg total) by mouth 2 (  two) times daily. What changed: Another medication with the same  name was removed. Continue taking this medication, and follow the directions you see here.       No Known Allergies  Consultations: EDP discussed with on-call neurology   Procedures/Studies: DG Chest 2 View  Result Date: 06/25/2020 CLINICAL DATA:  Chest pain.  Seizures. EXAM: CHEST - 2 VIEW COMPARISON:  April 22, 2020. FINDINGS: The heart size and mediastinal contours are within normal limits. Both lungs are clear. No visible pleural effusions or pneumothorax. No acute osseous abnormality. IMPRESSION: No active cardiopulmonary disease. Electronically Signed   By: Feliberto Harts MD   On: 06/25/2020 16:09   CT Head Wo Contrast  Result Date: 06/25/2020 CLINICAL DATA:  Seizures and fall. EXAM: CT HEAD WITHOUT CONTRAST TECHNIQUE: Contiguous axial images were obtained from the base of the skull through the vertex without intravenous contrast. COMPARISON:  04/22/2020 FINDINGS: Brain: The brainstem, cerebellum, cerebral peduncles, thalami, basal ganglia, basilar cisterns, and ventricular system appear within normal limits. No intracranial hemorrhage, mass lesion, or acute CVA. Vascular: Unremarkable Skull: Unremarkable Sinuses/Orbits: Unremarkable Other: Scalp edema or subcutaneous hematoma along the left posterior vertex for example on image 6 of series 10, but not appreciably changed from 04/22/2020. IMPRESSION: 1. No acute intracranial findings. 2. Scalp edema or subcutaneous hematoma along the left posterior vertex, but not appreciably changed from 04/22/2020. Electronically Signed   By: Gaylyn Rong M.D.   On: 06/25/2020 16:55   CT Cervical Spine Wo Contrast  Result Date: 06/25/2020 CLINICAL DATA:  Seizure, found down EXAM: CT CERVICAL SPINE WITHOUT CONTRAST TECHNIQUE: Multidetector CT imaging of the cervical spine was performed without intravenous contrast. Multiplanar CT image reconstructions were also generated. COMPARISON:  04/22/2020 FINDINGS: Alignment: Stable reversal of  cervical lordosis due to lower cervical spondylosis. Skull base and vertebrae: No acute fracture. No primary bone lesion or focal pathologic process. Soft tissues and spinal canal: No prevertebral fluid or swelling. No visible canal hematoma. Disc levels:  Stable spondylosis at the C5/C6 level. Upper chest: Airway is patent. Emphysematous changes are seen at the lung apices, stable. Other: Reconstructed images demonstrate no additional findings. IMPRESSION: 1. No acute cervical spine fracture.  Stable spondylosis. Electronically Signed   By: Sharlet Salina M.D.   On: 06/25/2020 16:30       Subjective: Patient seen and examined at bedside. Denies any seizures. No overnight fever or vomiting. Has tolerated diet.  Discharge Exam: Vitals:   06/27/20 0201 06/27/20 0508  BP: 117/89 108/68  Pulse: 81 70  Resp: 18 18  Temp: 97.9 F (36.6 C) 97.9 F (36.6 C)  SpO2:  99%    General: Pt is alert, awake, not in acute distress Cardiovascular: rate controlled, S1/S2 + Respiratory: bilateral decreased breath sounds at bases Abdominal: Soft, NT, ND, bowel sounds + Extremities: no edema, no cyanosis    The results of significant diagnostics from this hospitalization (including imaging, microbiology, ancillary and laboratory) are listed below for reference.     Microbiology: Recent Results (from the past 240 hour(s))  Resp Panel by RT-PCR (Flu A&B, Covid) Nasopharyngeal Swab     Status: None   Collection Time: 06/25/20  9:43 PM   Specimen: Nasopharyngeal Swab; Nasopharyngeal(NP) swabs in vial transport medium  Result Value Ref Range Status   SARS Coronavirus 2 by RT PCR NEGATIVE NEGATIVE Final    Comment: (NOTE) SARS-CoV-2 target nucleic acids are NOT DETECTED.  The SARS-CoV-2 RNA is generally detectable in upper respiratory specimens during the acute  phase of infection. The lowest concentration of SARS-CoV-2 viral copies this assay can detect is 138 copies/mL. A negative result does not  preclude SARS-Cov-2 infection and should not be used as the sole basis for treatment or other patient management decisions. A negative result may occur with  improper specimen collection/handling, submission of specimen other than nasopharyngeal swab, presence of viral mutation(s) within the areas targeted by this assay, and inadequate number of viral copies(<138 copies/mL). A negative result must be combined with clinical observations, patient history, and epidemiological information. The expected result is Negative.  Fact Sheet for Patients:  BloggerCourse.com  Fact Sheet for Healthcare Providers:  SeriousBroker.it  This test is no t yet approved or cleared by the [place] FDA and  has been authorized for detection and/or diagnosis of SARS-CoV-2 by FDA under an Emergency Use Authorization (EUA). This EUA will remain  in effect (meaning this test can be used) for the duration of the COVID-19 declaration under Section 564(b)(1) of the Act, 21 U.S.C.section 360bbb-3(b)(1), unless the authorization is terminated  or revoked sooner.       Influenza A by PCR NEGATIVE NEGATIVE Final   Influenza B by PCR NEGATIVE NEGATIVE Final    Comment: (NOTE) The Xpert Xpress SARS-CoV-2/FLU/RSV plus assay is intended as an aid in the diagnosis of influenza from Nasopharyngeal swab specimens and should not be used as a sole basis for treatment. Nasal washings and aspirates are unacceptable for Xpert Xpress SARS-CoV-2/FLU/RSV testing.  Fact Sheet for Patients: BloggerCourse.com  Fact Sheet for Healthcare Providers: SeriousBroker.it  This test is not yet approved or cleared by the [place] FDA and has been authorized for detection and/or diagnosis of SARS-CoV-2 by FDA under an Emergency Use Authorization (EUA). This EUA will remain in effect (meaning this test can be used) for the  duration of the COVID-19 declaration under Section 564(b)(1) of the Act, 21 U.S.C. section 360bbb-3(b)(1), unless the authorization is terminated or revoked.  Performed at [facility], [address] [place], [place] [address]      Labs: BNP (last 3 results) No results for input(s): BNP in the last 8760 hours. Basic Metabolic Panel: Recent Labs  Lab 06/21/20 0049 06/21/20 1755 06/25/20 1915  NA 139 140 139  K 3.6 4.1 3.4*  CL 106 103 105  CO2 24 28 24   GLUCOSE 109* 83 91  BUN 12 9 7   CREATININE 1.03 1.13 0.98  CALCIUM 9.6 9.6 8.9   Liver Function Tests: Recent Labs  Lab 06/21/20 0049 06/25/20 1915  AST 89* 24  ALT 45* 39  ALKPHOS 80 75  BILITOT 1.6* 1.9*  PROT 7.7 6.9  ALBUMIN 4.2 4.0   No results for input(s): LIPASE, AMYLASE in the last 168 hours. No results for input(s): AMMONIA in the last 168 hours. CBC: Recent Labs  Lab 06/21/20 1755 06/25/20 1642  WBC 5.3 6.4  NEUTROABS 2.4 3.6  HGB 15.7 15.8  HCT 48.3 48.0  MCV 97.2 97.0  PLT 159 218   Cardiac Enzymes: No results for input(s): CKTOTAL, CKMB, CKMBINDEX, TROPONINI in the last 168 hours. BNP: Invalid input(s): POCBNP CBG: Recent Labs  Lab 06/21/20 1758  GLUCAP 73   D-Dimer No results for input(s): DDIMER in the last 72 hours. Hgb A1c No results for input(s): HGBA1C in the last 72 hours. Lipid Profile No results for input(s): CHOL, HDL, LDLCALC, TRIG, CHOLHDL, LDLDIRECT in the last 72 hours. Thyroid function studies No results for input(s): TSH, T4TOTAL, T3FREE, THYROIDAB in the  last 72 hours.  Invalid input(s): FREET3 Anemia work up No results for input(s): VITAMINB12, FOLATE, FERRITIN, TIBC, IRON, RETICCTPCT in the last 72 hours. Urinalysis    Component Value Date/Time   COLORURINE YELLOW 08/06/2019 1322   APPEARANCEUR CLEAR 08/06/2019 1322   LABSPEC 1.021 08/06/2019 1322   PHURINE 5.0 08/06/2019 1322   GLUCOSEU NEGATIVE 08/06/2019 1322   HGBUR NEGATIVE  08/06/2019 1322   BILIRUBINUR NEGATIVE 08/06/2019 1322   BILIRUBINUR negative 09/25/2014 1641   KETONESUR 80 (A) 08/06/2019 1322   PROTEINUR 100 (A) 08/06/2019 1322   UROBILINOGEN 4.0 09/25/2014 1641   UROBILINOGEN 0.2 08/19/2014 1152   NITRITE NEGATIVE 08/06/2019 1322   LEUKOCYTESUR NEGATIVE 08/06/2019 1322   Sepsis Labs Invalid input(s): PROCALCITONIN,  WBC,  LACTICIDVEN Microbiology Recent Results (from the past 240 hour(s))  Resp Panel by RT-PCR (Flu A&B, Covid) Nasopharyngeal Swab     Status: None   Collection Time: 06/25/20  9:43 PM   Specimen: Nasopharyngeal Swab; Nasopharyngeal(NP) swabs in vial transport medium  Result Value Ref Range Status   SARS Coronavirus 2 by RT PCR NEGATIVE NEGATIVE Final    Comment: (NOTE) SARS-CoV-2 target nucleic acids are NOT DETECTED.  The SARS-CoV-2 RNA is generally detectable in upper respiratory specimens during the acute phase of infection. The lowest concentration of SARS-CoV-2 viral copies this assay can detect is 138 copies/mL. A negative result does not preclude SARS-Cov-2 infection and should not be used as the sole basis for treatment or other patient management decisions. A negative result may occur with  improper specimen collection/handling, submission of specimen other than nasopharyngeal swab, presence of viral mutation(s) within the areas targeted by this assay, and inadequate number of viral copies(<138 copies/mL). A negative result must be combined with clinical observations, patient history, and epidemiological information. The expected result is Negative.  Fact Sheet for Patients:  BloggerCourse.comhttps://www.fda.gov/media/152166/download  Fact Sheet for Healthcare Providers:  SeriousBroker.ithttps://www.fda.gov/media/152162/download  This test is no t yet approved or cleared by the [place] FDA and  has been authorized for detection and/or diagnosis of SARS-CoV-2 by FDA under an Emergency Use Authorization (EUA). This EUA will remain  in  effect (meaning this test can be used) for the duration of the COVID-19 declaration under Section 564(b)(1) of the Act, 21 U.S.C.section 360bbb-3(b)(1), unless the authorization is terminated  or revoked sooner.       Influenza A by PCR NEGATIVE NEGATIVE Final   Influenza B by PCR NEGATIVE NEGATIVE Final    Comment: (NOTE) The Xpert Xpress SARS-CoV-2/FLU/RSV plus assay is intended as an aid in the diagnosis of influenza from Nasopharyngeal swab specimens and should not be used as a sole basis for treatment. Nasal washings and aspirates are unacceptable for Xpert Xpress SARS-CoV-2/FLU/RSV testing.  Fact Sheet for Patients: BloggerCourse.comhttps://www.fda.gov/media/152166/download  Fact Sheet for Healthcare Providers: SeriousBroker.ithttps://www.fda.gov/media/152162/download  This test is not yet approved or cleared by the [place] FDA and has been authorized for detection and/or diagnosis of SARS-CoV-2 by FDA under an Emergency Use Authorization (EUA). This EUA will remain in effect (meaning this test can be used) for the duration of the COVID-19 declaration under Section 564(b)(1) of the Act, 21 U.S.C. section 360bbb-3(b)(1), unless the authorization is terminated or revoked.  Performed at [facility], [address] [place], [place] [address]      Time coordinating discharge: 35 minutes  SIGNED:   Glade LloydKshitiz Ksean Vale, MD  [facility] 06/27/2020, 10:08 AM

## 2020-07-04 ENCOUNTER — Emergency Department (HOSPITAL_COMMUNITY)

## 2020-07-04 ENCOUNTER — Emergency Department (HOSPITAL_COMMUNITY)
Admission: EM | Admit: 2020-07-04 | Discharge: 2020-07-05 | Disposition: A | Discharge status: Home / Self Care | Attending: Emergency Medicine | Admitting: Emergency Medicine

## 2020-07-04 ENCOUNTER — Other Ambulatory Visit: Payer: Self-pay

## 2020-07-04 DIAGNOSIS — X749XXA Intentional self-harm by unspecified firearm discharge, initial encounter: Secondary | ICD-10-CM | POA: Insufficient documentation

## 2020-07-04 DIAGNOSIS — F17210 Nicotine dependence, cigarettes, uncomplicated: Secondary | ICD-10-CM | POA: Insufficient documentation

## 2020-07-04 DIAGNOSIS — S99921A Unspecified injury of right foot, initial encounter: Secondary | ICD-10-CM | POA: Diagnosis present

## 2020-07-04 DIAGNOSIS — T71164A Asphyxiation due to hanging, undetermined, initial encounter: Secondary | ICD-10-CM

## 2020-07-04 DIAGNOSIS — F15959 Other stimulant use, unspecified with stimulant-induced psychotic disorder, unspecified: Secondary | ICD-10-CM | POA: Insufficient documentation

## 2020-07-04 DIAGNOSIS — Z20822 Contact with and (suspected) exposure to covid-19: Secondary | ICD-10-CM | POA: Insufficient documentation

## 2020-07-04 DIAGNOSIS — Z765 Malingerer [conscious simulation]: Secondary | ICD-10-CM

## 2020-07-04 DIAGNOSIS — S91301A Unspecified open wound, right foot, initial encounter: Secondary | ICD-10-CM | POA: Diagnosis not present

## 2020-07-04 DIAGNOSIS — Z21 Asymptomatic human immunodeficiency virus [HIV] infection status: Secondary | ICD-10-CM | POA: Insufficient documentation

## 2020-07-04 DIAGNOSIS — F1594 Other stimulant use, unspecified with stimulant-induced mood disorder: Secondary | ICD-10-CM | POA: Diagnosis present

## 2020-07-04 DIAGNOSIS — W3400XA Accidental discharge from unspecified firearms or gun, initial encounter: Secondary | ICD-10-CM

## 2020-07-04 LAB — COMPREHENSIVE METABOLIC PANEL
ALT: 33 U/L (ref 0–44)
AST: 55 U/L — ABNORMAL HIGH (ref 15–41)
Albumin: 4 g/dL (ref 3.5–5.0)
Alkaline Phosphatase: 73 U/L (ref 38–126)
Anion gap: 15 (ref 5–15)
BUN: 13 mg/dL (ref 6–20)
CO2: 21 mmol/L — ABNORMAL LOW (ref 22–32)
Calcium: 9.4 mg/dL (ref 8.9–10.3)
Chloride: 103 mmol/L (ref 98–111)
Creatinine, Ser: 1.31 mg/dL — ABNORMAL HIGH (ref 0.61–1.24)
GFR, Estimated: >60 mL/min (ref >60)
Glucose, Bld: 95 mg/dL (ref 70–99)
Potassium: 4.7 mmol/L (ref 3.5–5.1)
Sodium: 139 mmol/L (ref 135–145)
Total Bilirubin: 3.4 mg/dL — ABNORMAL HIGH (ref 0.3–1.2)
Total Protein: 7.3 g/dL (ref 6.5–8.1)

## 2020-07-04 LAB — CBC
HCT: 46.5 % (ref 39.0–52.0)
Hemoglobin: 14.9 g/dL (ref 13.0–17.0)
MCH: 31.8 pg (ref 26.0–34.0)
MCHC: 32 g/dL (ref 30.0–36.0)
MCV: 99.1 fL (ref 80.0–100.0)
Platelets: 222 10*3/uL (ref 150–400)
RBC: 4.69 MIL/uL (ref 4.22–5.81)
RDW: 12.2 % (ref 11.5–15.5)
WBC: 8.7 10*3/uL (ref 4.0–10.5)
nRBC: 0 % (ref 0.0–0.2)

## 2020-07-04 LAB — ETHANOL: Alcohol, Ethyl (B): <10 mg/dL (ref <10)

## 2020-07-04 LAB — RESP PANEL BY RT-PCR (FLU A&B, COVID) ARPGX2
Influenza A by PCR: NEGATIVE
Influenza B by PCR: NEGATIVE
SARS Coronavirus 2 by RT PCR: NEGATIVE

## 2020-07-04 MED ORDER — BACITRACIN-NEOMYCIN-POLYMYXIN OINTMENT TUBE
TOPICAL_OINTMENT | Freq: Two times a day (BID) | CUTANEOUS | Status: DC
  Filled 2020-07-04: qty 14

## 2020-07-04 MED ORDER — FENTANYL CITRATE (PF) 100 MCG/2ML IJ SOLN
INTRAMUSCULAR | Status: AC
  Filled 2020-07-04: qty 2

## 2020-07-04 MED ORDER — ACETAMINOPHEN 325 MG PO TABS
650.0000 mg | ORAL_TABLET | ORAL | Status: DC | PRN
  Administered 2020-07-05: 650 mg via ORAL
  Filled 2020-07-04: qty 2

## 2020-07-04 MED ORDER — LEVETIRACETAM 500 MG PO TABS
500.0000 mg | ORAL_TABLET | Freq: Two times a day (BID) | ORAL | Status: DC
  Administered 2020-07-04 – 2020-07-05 (×3): 500 mg via ORAL
  Filled 2020-07-04 (×3): qty 1

## 2020-07-04 MED ORDER — IBUPROFEN 800 MG PO TABS: 800.0000 mg | ORAL_TABLET | Freq: Four times a day (QID) | ORAL | 0 refills | Status: DC | PRN

## 2020-07-04 MED ORDER — OXYCODONE-ACETAMINOPHEN 5-325 MG PO TABS
2.0000 | ORAL_TABLET | Freq: Once | ORAL | Status: AC
  Administered 2020-07-04: 2 via ORAL
  Filled 2020-07-04: qty 2

## 2020-07-04 MED ORDER — OLANZAPINE 5 MG PO TBDP
5.0000 mg | ORAL_TABLET | Freq: Two times a day (BID) | ORAL | Status: DC
  Administered 2020-07-04 – 2020-07-05 (×3): 5 mg via ORAL
  Filled 2020-07-04 (×3): qty 1

## 2020-07-04 MED ORDER — DOXYCYCLINE HYCLATE 100 MG PO TABS
100.0000 mg | ORAL_TABLET | Freq: Two times a day (BID) | ORAL | Status: DC
  Administered 2020-07-04 – 2020-07-05 (×3): 100 mg via ORAL
  Filled 2020-07-04 (×3): qty 1

## 2020-07-04 MED ORDER — CEFAZOLIN SODIUM-DEXTROSE 1-4 GM/50ML-% IV SOLN
1.0000 g | Freq: Once | INTRAVENOUS | Status: AC
  Administered 2020-07-04: 1 g via INTRAVENOUS
  Filled 2020-07-04: qty 50

## 2020-07-04 MED ORDER — DOXYCYCLINE HYCLATE 100 MG PO CAPS: 100.0000 mg | ORAL_CAPSULE | Freq: Two times a day (BID) | ORAL | 0 refills | Status: DC

## 2020-07-04 MED ORDER — BICTEGRAVIR-EMTRICITAB-TENOFOV 50-200-25 MG PO TABS
1.0000 | ORAL_TABLET | Freq: Every day | ORAL | Status: DC
  Administered 2020-07-04 – 2020-07-05 (×2): 1 via ORAL
  Filled 2020-07-04 (×2): qty 1

## 2020-07-04 MED ORDER — HYDROCODONE-ACETAMINOPHEN 5-325 MG PO TABS: 1.0000 | ORAL_TABLET | ORAL | 0 refills | Status: DC | PRN

## 2020-07-04 MED ORDER — HYDROCODONE-ACETAMINOPHEN 5-325 MG PO TABS
1.0000 | ORAL_TABLET | Freq: Four times a day (QID) | ORAL | Status: DC | PRN
  Administered 2020-07-04 (×2): 1 via ORAL
  Administered 2020-07-05 (×2): 2 via ORAL
  Filled 2020-07-04 (×2): qty 1
  Filled 2020-07-04 (×2): qty 2

## 2020-07-04 MED ORDER — TETANUS-DIPHTH-ACELL PERTUSSIS 5-2.5-18.5 LF-MCG/0.5 IM SUSY
0.5000 mL | PREFILLED_SYRINGE | Freq: Once | INTRAMUSCULAR | Status: DC
  Filled 2020-07-04: qty 0.5

## 2020-07-04 MED ORDER — HYDROCODONE-ACETAMINOPHEN 5-325 MG PO TABS: 1.0000 | ORAL_TABLET | Freq: Four times a day (QID) | ORAL | Status: DC | PRN

## 2020-07-04 MED ORDER — FENTANYL CITRATE (PF) 100 MCG/2ML IJ SOLN
100.0000 ug | Freq: Once | INTRAMUSCULAR | Status: AC
  Administered 2020-07-04: 100 ug via INTRAVENOUS

## 2020-07-04 NOTE — ED Notes (Addendum)
Pt was being discharged and after multiple attempts to get patient dressed patient started swearing at the nurse and saying the MD needed to  get him a" fucking ride". Patients iv was removed and last set of vitals stable. Wound dressed appropriately and nurse was trying to get patient into wheelchair and into lobby. Patient continued to yell at nurse and emt and security was called to deescalate patient which did not work. Patient then stated that "he was going to kill himself" multiple times in front of nurse, MD, and security. Patient then took pulse ox cord and wrapped it around his neck and proceeded to choke himself with the pulse ox cord. Pulse ox cord removed from patients neck and patient moved to hallway H13. Suicide precautions were initiated.
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
## 2020-07-04 NOTE — Progress Notes (Signed)
Orthopedic Tech Progress Note Patient Details:  Justin [name] 10/14/1980 [id number]  Ortho Devices Type of Ortho Device: Crutches Ortho Device/Splint Interventions: Adjustment   Post Interventions Instructions Provided: Poper ambulation with device,Adjustment of device   Riker Collier E Ermal Haberer 07/04/2020, 6:14 AM

## 2020-07-04 NOTE — ED Notes (Signed)
Belongings inventoried and placed in locker 2 (pants, 1 shoe, 1 sock, belt)
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
## 2020-07-04 NOTE — ED Notes (Signed)
Ortho tech completed education on crutches. Wound irrigated and dressed.

## 2020-07-04 NOTE — ED Notes (Signed)
Security at bedside to want pt. 

## 2020-07-04 NOTE — ED Notes (Addendum)
Phone removed from pt room, d/t safety and d/t he tried to strangle himself with the pulse oximeter cord earlier today per report. Pt was verbally aggressive d/t the removal of the phone from his room.

## 2020-07-04 NOTE — ED Notes (Signed)
Pt changed into maroon scrubs w/o any incident. [employer] officer assisted and at bedside.
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
## 2020-07-04 NOTE — ED Notes (Signed)
ORDERED DIET TRAY  

## 2020-07-04 NOTE — BH Assessment (Signed)
[facility], RN, notified through secure chat that per Elta Guadeloupe, NP,  patient is recommended for overnight observation in the ED and will be reassessed by provider in the morning due to patient having SI/HI and not being able to contract for safety. NP will start medications for patient to take while in the ED. Per Elta Guadeloupe, NP, if patient contracts for safety before tomorrow reassessment, he can be discharged with resources for transportation to a shelter seeing that patient has history of malingering and primary substance use disorder.

## 2020-07-05 LAB — RAPID URINE DRUG SCREEN, HOSP PERFORMED
Amphetamines: POSITIVE — AB
Barbiturates: NOT DETECTED
Benzodiazepines: NOT DETECTED
Cocaine: NOT DETECTED
Opiates: POSITIVE — AB
Tetrahydrocannabinol: NOT DETECTED

## 2020-07-05 MED ORDER — ZIPRASIDONE MESYLATE 20 MG IM SOLR: 10.0000 mg | Freq: Once | INTRAMUSCULAR | Status: DC

## 2020-07-05 NOTE — ED Notes (Signed)
Patient discharged and escorted out of unit by [employer] security. Patient also given cab voucher for transportation.

## 2020-07-05 NOTE — ED Notes (Signed)
Breakfast Ordered 

## 2020-07-05 NOTE — ED Notes (Addendum)
Removed patients previous dressing to left foot. Left foot wound irrigated and cleansed with saline. Ointment placed with vaseline gauze covering site. Foot covered with ABD pad and wrapped with gauze, reinforced with tape. Patient also given crutches and wound supplies.

## 2020-07-05 NOTE — ED Notes (Signed)
While patient was completing telepsych, patient's sitter noted that the patient was wrapping telepsych computer cord around his neck. Sitter immediately intervened and alerted this nurse. [employer] to bedside as well as [employer] security. Monitor removed from room.   Dr. Madilyn Hook notified and Psych counselor aware.

## 2020-07-05 NOTE — ED Notes (Signed)
Patient given sandwich bag with po fluids at this time.
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
## 2020-07-07 ENCOUNTER — Encounter (HOSPITAL_COMMUNITY): Payer: Self-pay

## 2020-07-07 ENCOUNTER — Other Ambulatory Visit: Payer: Self-pay

## 2020-07-07 ENCOUNTER — Emergency Department (HOSPITAL_COMMUNITY)
Admission: EM | Admit: 2020-07-07 | Discharge: 2020-07-09 | Disposition: A | Discharge status: Home / Self Care | Attending: Emergency Medicine | Admitting: Emergency Medicine

## 2020-07-07 DIAGNOSIS — F17210 Nicotine dependence, cigarettes, uncomplicated: Secondary | ICD-10-CM | POA: Insufficient documentation

## 2020-07-07 DIAGNOSIS — F1594 Other stimulant use, unspecified with stimulant-induced mood disorder: Secondary | ICD-10-CM | POA: Diagnosis present

## 2020-07-07 DIAGNOSIS — F1910 Other psychoactive substance abuse, uncomplicated: Secondary | ICD-10-CM | POA: Insufficient documentation

## 2020-07-07 DIAGNOSIS — F1520 Other stimulant dependence, uncomplicated: Secondary | ICD-10-CM | POA: Diagnosis present

## 2020-07-07 DIAGNOSIS — F333 Major depressive disorder, recurrent, severe with psychotic symptoms: Secondary | ICD-10-CM | POA: Insufficient documentation

## 2020-07-07 DIAGNOSIS — F15959 Other stimulant use, unspecified with stimulant-induced psychotic disorder, unspecified: Secondary | ICD-10-CM | POA: Insufficient documentation

## 2020-07-07 DIAGNOSIS — Z21 Asymptomatic human immunodeficiency virus [HIV] infection status: Secondary | ICD-10-CM | POA: Insufficient documentation

## 2020-07-07 DIAGNOSIS — R45851 Suicidal ideations: Secondary | ICD-10-CM | POA: Insufficient documentation
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
## 2020-07-08 ENCOUNTER — Other Ambulatory Visit: Payer: Self-pay

## 2020-07-08 ENCOUNTER — Encounter (HOSPITAL_COMMUNITY): Payer: Self-pay | Admitting: Registered Nurse

## 2020-07-08 DIAGNOSIS — F1520 Other stimulant dependence, uncomplicated: Secondary | ICD-10-CM

## 2020-07-08 DIAGNOSIS — R45851 Suicidal ideations: Secondary | ICD-10-CM

## 2020-07-08 DIAGNOSIS — F333 Major depressive disorder, recurrent, severe with psychotic symptoms: Secondary | ICD-10-CM

## 2020-07-08 MED ORDER — BICTEGRAVIR-EMTRICITAB-TENOFOV 50-200-25 MG PO TABS
1.0000 | ORAL_TABLET | Freq: Every day | ORAL | Status: DC
  Administered 2020-07-08 – 2020-07-09 (×2): 1 via ORAL
  Filled 2020-07-08 (×2): qty 1

## 2020-07-08 MED ORDER — LEVETIRACETAM 500 MG PO TABS
500.0000 mg | ORAL_TABLET | Freq: Two times a day (BID) | ORAL | Status: DC
  Administered 2020-07-08 – 2020-07-09 (×2): 500 mg via ORAL
  Filled 2020-07-08 (×3): qty 1

## 2020-07-08 MED ORDER — IBUPROFEN 800 MG PO TABS
800.0000 mg | ORAL_TABLET | Freq: Once | ORAL | Status: AC
  Administered 2020-07-08: 800 mg via ORAL
  Filled 2020-07-08: qty 1

## 2020-07-08 MED ORDER — IBUPROFEN 800 MG PO TABS
800.0000 mg | ORAL_TABLET | Freq: Four times a day (QID) | ORAL | Status: DC | PRN
  Administered 2020-07-08: 800 mg via ORAL
  Filled 2020-07-08 (×2): qty 1

## 2020-07-08 MED ORDER — ACETAMINOPHEN 325 MG PO TABS
650.0000 mg | ORAL_TABLET | Freq: Four times a day (QID) | ORAL | Status: DC | PRN
  Filled 2020-07-08: qty 2

## 2020-07-08 MED ORDER — OLANZAPINE 5 MG PO TBDP
5.0000 mg | ORAL_TABLET | Freq: Two times a day (BID) | ORAL | Status: DC
  Administered 2020-07-08 – 2020-07-09 (×2): 5 mg via ORAL
  Filled 2020-07-08 (×3): qty 1

## 2020-07-08 MED ORDER — LEVETIRACETAM 500 MG PO TABS
500.0000 mg | ORAL_TABLET | Freq: Once | ORAL | Status: AC
  Administered 2020-07-08: 500 mg via ORAL
  Filled 2020-07-08: qty 1

## 2020-07-08 MED ORDER — FLUOXETINE HCL 20 MG PO CAPS
20.0000 mg | ORAL_CAPSULE | Freq: Every day | ORAL | Status: DC
  Administered 2020-07-08 – 2020-07-09 (×2): 20 mg via ORAL
  Filled 2020-07-08 (×2): qty 1

## 2020-07-08 MED ORDER — DOXYCYCLINE HYCLATE 100 MG PO TABS
100.0000 mg | ORAL_TABLET | Freq: Two times a day (BID) | ORAL | Status: DC
  Administered 2020-07-09: 100 mg via ORAL
  Filled 2020-07-08 (×2): qty 1

## 2020-07-08 MED ORDER — DOXYCYCLINE HYCLATE 100 MG PO TABS
100.0000 mg | ORAL_TABLET | Freq: Once | ORAL | Status: AC
  Administered 2020-07-08: 100 mg via ORAL
  Filled 2020-07-08: qty 1

## 2020-07-08 MED ORDER — LEVETIRACETAM 500 MG PO TABS: 1000.0000 mg | ORAL_TABLET | Freq: Once | ORAL | Status: DC

## 2020-07-08 NOTE — [facility] Counselor (Signed)
Attempted to assess Pt.  Currently no room/availability for assessment.  Will attempt assessment later.

## 2020-07-08 NOTE — ED Notes (Signed)
Cleaned pt's foot with warm water and wound spray. Placed 2 xeroform dressings and wrapped with kurlex. Elevated foot.
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
## 2020-07-09 ENCOUNTER — Emergency Department (HOSPITAL_COMMUNITY)
Admission: EM | Admit: 2020-07-09 | Discharge: 2020-07-10 | Disposition: A | Discharge status: Home / Self Care | Payer: Self-pay | Attending: Emergency Medicine | Admitting: Emergency Medicine

## 2020-07-09 ENCOUNTER — Other Ambulatory Visit: Payer: Self-pay

## 2020-07-09 ENCOUNTER — Encounter (HOSPITAL_COMMUNITY): Payer: Self-pay | Admitting: Emergency Medicine

## 2020-07-09 DIAGNOSIS — F17210 Nicotine dependence, cigarettes, uncomplicated: Secondary | ICD-10-CM | POA: Insufficient documentation

## 2020-07-09 DIAGNOSIS — Z21 Asymptomatic human immunodeficiency virus [HIV] infection status: Secondary | ICD-10-CM | POA: Insufficient documentation

## 2020-07-09 DIAGNOSIS — Z765 Malingerer [conscious simulation]: Secondary | ICD-10-CM | POA: Insufficient documentation

## 2020-07-09 MED ORDER — HYDROGEN PEROXIDE 3 % EX SOLN
CUTANEOUS | Status: AC
  Administered 2020-07-09: 1
  Filled 2020-07-09: qty 473

## 2020-07-09 NOTE — ED Notes (Signed)
Notified management. Pt is cleared for discharge. Proceeding with discharge. Provided pt with meal tray (he said he was hungry) and discharge paperwork.

## 2020-07-09 NOTE — ED Notes (Signed)
RN brought patient his medications. Patient was asleep. RN attempted to wake patient up to administer meds x2. Patient would not wake up. Pt resting, no acute distress noted.

## 2020-07-09 NOTE — ED Notes (Signed)
Went to discharge pt. He said, "This is not going to be a good discharge. As soon as I leave here, I'm going to end my life. I don't want to make your job more difficult, but that's what I'm going to do."

## 2020-07-09 NOTE — Consult Note (Signed)
[facility] Face-to-Face Psychiatry Consult   Reason for Consult: Psychiatry reassess Referring Physician: [facility] emergency department physician Patient Identification: Justin [name] MRN:  [id number] Principal Diagnosis: Severe episode of recurrent major depressive disorder, with psychotic features (HCC) Diagnosis:  Principal Problem:   Severe episode of recurrent major depressive disorder, with psychotic features (HCC) Active Problems:   Methamphetamine use disorder, severe, dependence (HCC)   Methamphetamine-induced mood disorder (HCC)   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HCC)   Suicidal ideations   Total Time spent with patient: 30 minutes  Subjective:   Justin [name] is a 40 y.o. male patient. Patient states "I want a new life, I have had time to sit back and think about things, I want to start fresh."  HPI:   Patient assessed by nurse practitioner.  Patient alert and oriented, answers appropriately.  Patient pleasant cooperative during assessment.  Patient reports recent stressors include relapse on methamphetamine.  Patient reports readiness to stop using methamphetamine as well as other substances.  Patient reports he has not been using methamphetamine for few days and feels like "my mind is clear."  Patient agrees with plan to speak with peers support specialist.  Patient familiar with peers support specialist staff.  Patient reports additional stressor is homelessness.  Patient reports "I just need to be in a safe place."   Patient has been diagnosed with major depressive disorder in the past.  Patient reports he is not currently followed by outpatient psychiatry but verbalizes plan to follow-up with [facility] for outpatient treatment once discharged.  Patient reports noncompliance with medications "for a while."  Patient reports plan to remain on Prozac and Zyprexa moving forward.  Patient denies suicidal and homicidal ideations  today.  Patient reports history of multiple suicide attempts in the past.  Patient contracts verbally for safety with this Clinical research associate. Patient denies both auditory and visual hallucinations.  There is no evidence of delusional thought content and no indication that patient is responding to internal stimuli.  Patient denies symptoms of paranoia.  Patient gives verbal consent to speak with friend, Lucrezia Starch.  Patient reports he is not able to reside with his friend as he has small children and wife is currently pregnant.  Patient reports that his friend is an emotional and financial support.  Patient offered support and encouragement.  Patient reports readiness to discharge.  Past Psychiatric History: Major depressive disorder, substance use disorder, amphetamine and psychostimulant-induced psychosis with delusions, anxiety, methamphetamine use disorder, methamphetamine induced mood disorder, methamphetamine induced psychotic disorder  Risk to Self:  Denies Risk to Others:  Denies Prior Inpatient Therapy:  Yes Prior Outpatient Therapy:  Yes, current follow-up with [facility]  Past Medical History:  Past Medical History:  Diagnosis Date  . Endocarditis   . Hepatitis C   . History of syphilis 08/25/2014   Treated by [facility] 07-2014.  [name] HIV (human immunodeficiency virus infection) (HCC)   . Seizures (HCC)     Past Surgical History:  Procedure Laterality Date  . hand surgery Right   . LUMBAR PUNCTURE  08/08/2019      . WRIST SURGERY     Family History:  Family History  Adopted: Yes  Family history unknown: Yes   Family Psychiatric  History: None reported Social History:  Social History   Substance and Sexual Activity  Alcohol Use Yes   Comment: 1-2 a week      Social History   Substance and Sexual Activity  Drug Use Yes  . Frequency: 14.0 times per week  . Types: Marijuana, Methamphetamines    Social History   Socioeconomic History  . Marital  status: Widowed    Spouse name: Not on file  . Number of children: Not on file  . Years of education: Not on file  . Highest education level: Not on file  Occupational History  . Not on file  Tobacco Use  . Smoking status: Current Every Day Smoker    Types: Cigarettes  . Smokeless tobacco: Never Used  Vaping Use  . Vaping Use: Never used  Substance and Sexual Activity  . Alcohol use: Yes    Comment: 1-2 a week   . Drug use: Yes    Frequency: 14.0 times per week    Types: Marijuana, Methamphetamines  . Sexual activity: Yes    Partners: Male    Birth control/protection: Condom    Comment: given condoms  Other Topics Concern  . Not on file  Social History Narrative   ** Merged History Encounter **       ** Merged History Encounter **       ** Merged History Encounter **       Social Determinants of Corporate investment banker Strain: Not on file  Food Insecurity: Not on file  Transportation Needs: Not on file  Physical Activity: Not on file  Stress: Not on file  Social Connections: Not on file   Additional Social History:    Allergies:  No Known Allergies  Labs: No results found for this or any previous visit (from the past 48 hour(s)).  Current Facility-Administered Medications  Medication Dose Route Frequency Provider Last Rate Last Admin  . acetaminophen (TYLENOL) tablet 650 mg  650 mg Oral Q6H PRN Charlynne Pander, MD      . bictegravir-emtricitabine-tenofovir AF [facility]) 50-200-25 MG per tablet 1 tablet  1 tablet Oral Daily Charlynne Pander, MD   1 tablet at 07/09/20 [phone]  . doxycycline (VIBRA-TABS) tablet 100 mg  100 mg Oral Q12H Charlynne Pander, MD   100 mg at 07/09/20 7939  . FLUoxetine (PROZAC) capsule 20 mg  20 mg Oral Daily Charlynne Pander, MD   20 mg at 07/09/20 0300  . ibuprofen (ADVIL) tablet 800 mg  800 mg Oral Q6H PRN Charlynne Pander, MD   800 mg at 07/08/20 1744  . levETIRAcetam (KEPPRA) tablet 500 mg  500 mg Oral BID Charlynne Pander, MD   500 mg at 07/09/20 9233  . OLANZapine zydis (ZYPREXA) disintegrating tablet 5 mg  5 mg Oral BID Rankin, Shuvon B, NP   5 mg at 07/08/20 1746   Current Outpatient Medications  Medication Sig Dispense Refill  . bictegravir-emtricitabine-tenofovir AF (BIKTARVY) 50-200-25 MG TABS tablet Take 1 tablet by mouth daily. 30 tablet 5  . doxycycline (VIBRAMYCIN) 100 MG capsule Take 1 capsule (100 mg total) by mouth 2 (two) times daily. 20 capsule 0  . FLUoxetine (PROZAC) 20 MG tablet Take 1 tablet (20 mg total) by mouth daily. 14 tablet 0  . levETIRAcetam (KEPPRA) 500 MG tablet Take 1 tablet (500 mg total) by mouth 2 (two) times daily. 60 tablet 0    Musculoskeletal: Strength & Muscle Tone: within normal limits Gait & Station: Currently using crutches to assist Patient leans: N/A  Psychiatric Specialty Exam: Physical Exam Vitals and nursing note reviewed.  Constitutional:      Appearance: He is well-developed.  HENT:  Head: Normocephalic.  Cardiovascular:     Rate and Rhythm: Normal rate.  Pulmonary:     Effort: Pulmonary effort is normal.  Neurological:     Mental Status: He is alert and oriented to person, place, and time.  Psychiatric:        Attention and Perception: Attention and perception normal.        Mood and Affect: Mood and affect normal.        Speech: Speech normal.        Behavior: Behavior normal. Behavior is cooperative.        Thought Content: Thought content normal.        Cognition and Memory: Cognition and memory normal.        Judgment: Judgment normal.     Review of Systems  Constitutional: Negative.   HENT: Negative.   Eyes: Negative.   Respiratory: Negative.   Cardiovascular: Negative.   Gastrointestinal: Negative.   Genitourinary: Negative.   Musculoskeletal: Negative.   Skin: Negative.   Neurological: Negative.   Psychiatric/Behavioral: Negative.     Blood pressure 119/72, pulse 86, temperature 98.5 F (36.9 C), temperature source  Oral, resp. rate 16, SpO2 100 %.There is no height or weight on file to calculate BMI.  General Appearance: Casual and Fairly Groomed  Eye Contact:  Good  Speech:  Clear and Coherent and Normal Rate  Volume:  Normal  Mood:  Euthymic  Affect:  Appropriate and Congruent  Thought Process:  Coherent, Goal Directed and Descriptions of Associations: Intact  Orientation:  Full (Time, Place, and Person)  Thought Content:  WDL and Logical  Suicidal Thoughts:  No  Homicidal Thoughts:  No  Memory:  Immediate;   Good Recent;   Good Remote;   Good  Judgement:  Good  Insight:  Good  Psychomotor Activity:  Normal  Concentration:  Concentration: Good and Attention Span: Good  Recall:  Good  Fund of Knowledge:  Good  Language:  Good  Akathisia:  No  Handed:  Right  AIMS (if indicated):     Assets:  Communication Skills Desire for Improvement Financial Resources/Insurance Intimacy Leisure Time Physical Health Resilience Social Support Talents/Skills  ADL's:  Intact  Cognition:  WNL  Sleep:        Treatment Plan Summary: Patient reviewed with Dr. Hampton Abbot. Patient cleared by psychiatry.  Current medications: -Fluoxetine 20 mg daily -Olanzapine Zydis 5 mg twice daily  Disposition: No evidence of imminent risk to self or others at present.   Patient does not meet criteria for psychiatric inpatient admission. Supportive therapy provided about ongoing stressors. Discussed crisis plan, support from social network, calling 911, coming to the Emergency Department, and calling Suicide Hotline.  Emmaline Kluver, FNP 07/09/2020 10:49 AM

## 2020-07-09 NOTE — ED Provider Notes (Addendum)
Emergency Medicine Observation Re-evaluation Note  Justin [name] is a 40 y.o. male, seen on rounds today.  Pt initially presented to the ED for complaints of Suicidal Currently, the patient is here for evaluation of suicidal ideation.  Physical Exam  BP 119/72 (BP Location: Right Arm)   Pulse 86   Temp 98.5 F (36.9 C) (Oral)   Resp 16   SpO2 100%  Physical Exam General: Pleasant and resting comfortably. Cardiac: No murmur Lungs: Clear breath Psych: Resting comfortably and pleasant.  Reports he is excited for breakfast.  ED Course / MDM  EKG:EKG Interpretation  Date/Time:  Wednesday July 08 2020 13:11:16 EST Ventricular Rate:  72 PR Interval:  136 QRS Duration: 94 QT Interval:  392 QTC Calculation: 429 R Axis:   14 Text Interpretation: Normal sinus rhythm Normal ECG since last tracing no significant change Confirmed by Mancel Bale [phone]) on 07/08/2020 3:44:02 PM    I have reviewed the labs performed to date as well as medications administered while in observation.  Recent changes in the last 24 hours include none.  Plan  Current plan is for inpatient psychiatric management.    Tegeler, Canary Brim, MD 07/09/20 [phone]   11:37 AM Psychiatry reports patient is now psychiatrically cleared.  They request to get resources for housing and substance abuse which will be printed for him.  Peers, to talk to him now.  He will be discharged for outpatient follow-up.   Tegeler, Canary Brim, MD 07/09/20 1137

## 2020-07-09 NOTE — ED Notes (Signed)
Soaking pt foot in hydrogen peroxide and sterile water.

## 2020-07-09 NOTE — ED Notes (Signed)
ED Provider at bedside. 

## 2020-07-09 NOTE — Patient Outreach (Signed)
[facility] met with Pt an was made aware that he has no where to go an that he is seeking housing as well as treatment at this time. [facility] has several facilities in state as well as out of state that [facility] has contacted for services. [facility] are waiting for reply at this time.

## 2020-07-09 NOTE — ED Triage Notes (Signed)
Patient just discharge from ED-has been cleared medically and by psych-states he wants help for his psych issues-states he took his bottle of hydrocodone-per Dr Anitra Lauth he may wait in lobby

## 2020-07-09 NOTE — ED Notes (Addendum)
Returned all 4 patient belonging bags to pt. Pt came to nursing station with his belonging bag. He removed two pill bottles and appeared to take pills. Then he said, "Im going through some powerful mental stuff. I'm taking 10 hydrocodones and 10 ibuprofens. I'm trying to commit suicide." Bottles are labeled with hydrocondone-acteaminphen 5-325 mg and ibuprofen 800 mg. I am uncertain if pt actually took medication or pretended to take it.  Messaged ED provider and management. Asked pt to return to his seat.

## 2020-07-09 NOTE — Discharge Instructions (Signed)
The mental health team saw you again today and feel you are now safe for discharge home.  They request we give you the information for shelters and housing resources as well as resources for substance abuse assistance.  Please follow-up with these numbers and also see a primary doctor.  Please rest and stay hydrated.  If any symptoms change or worsen, please return to the nearest emergency department.

## 2020-07-09 NOTE — ED Notes (Addendum)
Pt returned to nurses station and said he was leaving. He left his belongings in the wheelchair at the nurses station. Consolidated pt belongings into two bags and asked security to give them to pt if he is sitting outside. If he is not outside his belonging will be in patient belonging cabinets at triage.

## 2020-07-10 ENCOUNTER — Ambulatory Visit (HOSPITAL_COMMUNITY)
Admission: EM | Admit: 2020-07-10 | Discharge: 2020-07-10 | Disposition: A | Discharge status: Home / Self Care | Payer: No Payment, Other | Attending: Nurse Practitioner | Admitting: Nurse Practitioner

## 2020-07-10 ENCOUNTER — Other Ambulatory Visit: Payer: Self-pay

## 2020-07-10 ENCOUNTER — Encounter (HOSPITAL_COMMUNITY): Payer: Self-pay | Admitting: Emergency Medicine

## 2020-07-10 ENCOUNTER — Ambulatory Visit (HOSPITAL_COMMUNITY)
Admission: AD | Admit: 2020-07-10 | Discharge: 2020-07-10 | Disposition: A | Discharge status: Home / Self Care | Payer: No Payment, Other | Attending: Psychiatry | Admitting: Psychiatry

## 2020-07-10 DIAGNOSIS — Z560 Unemployment, unspecified: Secondary | ICD-10-CM | POA: Insufficient documentation

## 2020-07-10 DIAGNOSIS — F32A Depression, unspecified: Secondary | ICD-10-CM | POA: Insufficient documentation

## 2020-07-10 DIAGNOSIS — R440 Auditory hallucinations: Secondary | ICD-10-CM | POA: Insufficient documentation

## 2020-07-10 DIAGNOSIS — Z5900 Homelessness unspecified: Secondary | ICD-10-CM | POA: Diagnosis not present

## 2020-07-10 DIAGNOSIS — R441 Visual hallucinations: Secondary | ICD-10-CM | POA: Insufficient documentation

## 2020-07-10 DIAGNOSIS — F17210 Nicotine dependence, cigarettes, uncomplicated: Secondary | ICD-10-CM | POA: Insufficient documentation

## 2020-07-10 DIAGNOSIS — R45850 Homicidal ideations: Secondary | ICD-10-CM | POA: Diagnosis not present

## 2020-07-10 DIAGNOSIS — F332 Major depressive disorder, recurrent severe without psychotic features: Secondary | ICD-10-CM

## 2020-07-10 DIAGNOSIS — Z9151 Personal history of suicidal behavior: Secondary | ICD-10-CM | POA: Diagnosis not present

## 2020-07-10 DIAGNOSIS — R45851 Suicidal ideations: Secondary | ICD-10-CM | POA: Insufficient documentation

## 2020-07-10 DIAGNOSIS — F1220 Cannabis dependence, uncomplicated: Secondary | ICD-10-CM | POA: Insufficient documentation

## 2020-07-10 DIAGNOSIS — F1520 Other stimulant dependence, uncomplicated: Secondary | ICD-10-CM

## 2020-07-10 DIAGNOSIS — F1910 Other psychoactive substance abuse, uncomplicated: Secondary | ICD-10-CM | POA: Insufficient documentation

## 2020-07-10 DIAGNOSIS — Z20822 Contact with and (suspected) exposure to covid-19: Secondary | ICD-10-CM | POA: Insufficient documentation

## 2020-07-10 DIAGNOSIS — Z62819 Personal history of unspecified abuse in childhood: Secondary | ICD-10-CM | POA: Insufficient documentation

## 2020-07-10 LAB — CBC WITH DIFFERENTIAL/PLATELET
Abs Immature Granulocytes: 0.04 10*3/uL (ref 0.00–0.07)
Basophils Absolute: 0 10*3/uL (ref 0.0–0.1)
Basophils Relative: 0 %
Eosinophils Absolute: 0 10*3/uL (ref 0.0–0.5)
Eosinophils Relative: 0 %
HCT: 41.1 % (ref 39.0–52.0)
Hemoglobin: 13.8 g/dL (ref 13.0–17.0)
Immature Granulocytes: 1 %
Lymphocytes Relative: 21 %
Lymphs Abs: 1.6 10*3/uL (ref 0.7–4.0)
MCH: 32.2 pg (ref 26.0–34.0)
MCHC: 33.6 g/dL (ref 30.0–36.0)
MCV: 95.8 fL (ref 80.0–100.0)
Monocytes Absolute: 0.7 10*3/uL (ref 0.1–1.0)
Monocytes Relative: 9 %
Neutro Abs: 5.1 10*3/uL (ref 1.7–7.7)
Neutrophils Relative %: 69 %
Platelets: 301 10*3/uL (ref 150–400)
RBC: 4.29 MIL/uL (ref 4.22–5.81)
RDW: 12 % (ref 11.5–15.5)
WBC: 7.5 10*3/uL (ref 4.0–10.5)
nRBC: 0 % (ref 0.0–0.2)

## 2020-07-10 LAB — RESP PANEL BY RT-PCR (FLU A&B, COVID) ARPGX2
Influenza A by PCR: NEGATIVE
Influenza B by PCR: NEGATIVE
SARS Coronavirus 2 by RT PCR: NEGATIVE

## 2020-07-10 LAB — ACETAMINOPHEN LEVEL: Acetaminophen (Tylenol), Serum: 16 ug/mL (ref 10–30)

## 2020-07-10 LAB — COMPREHENSIVE METABOLIC PANEL
ALT: 23 U/L (ref 0–44)
AST: 26 U/L (ref 15–41)
Albumin: 3.9 g/dL (ref 3.5–5.0)
Alkaline Phosphatase: 70 U/L (ref 38–126)
Anion gap: 15 (ref 5–15)
BUN: 15 mg/dL (ref 6–20)
CO2: 24 mmol/L (ref 22–32)
Calcium: 9.4 mg/dL (ref 8.9–10.3)
Chloride: 100 mmol/L (ref 98–111)
Creatinine, Ser: 1.44 mg/dL — ABNORMAL HIGH (ref 0.61–1.24)
GFR, Estimated: >60 mL/min (ref >60)
Glucose, Bld: 100 mg/dL — ABNORMAL HIGH (ref 70–99)
Potassium: 4.1 mmol/L (ref 3.5–5.1)
Sodium: 139 mmol/L (ref 135–145)
Total Bilirubin: 1 mg/dL (ref 0.3–1.2)
Total Protein: 7.1 g/dL (ref 6.5–8.1)

## 2020-07-10 LAB — POCT URINE DRUG SCREEN - MANUAL ENTRY (I-SCREEN)
POC Amphetamine UR: NOT DETECTED
POC Buprenorphine (BUP): NOT DETECTED
POC Cocaine UR: NOT DETECTED
POC Marijuana UR: NOT DETECTED
POC Methadone UR: NOT DETECTED
POC Methamphetamine UR: NOT DETECTED
POC Morphine: POSITIVE — AB
POC Oxazepam (BZO): NOT DETECTED
POC Oxycodone UR: NOT DETECTED
POC Secobarbital (BAR): NOT DETECTED

## 2020-07-10 LAB — POC SARS CORONAVIRUS 2 AG -  ED: SARS Coronavirus 2 Ag: NEGATIVE

## 2020-07-10 LAB — SALICYLATE LEVEL: Salicylate Lvl: <7 mg/dL — ABNORMAL LOW (ref 7.0–30.0)

## 2020-07-10 MED ORDER — DOXYCYCLINE HYCLATE 100 MG PO TABS: 100.0000 mg | ORAL_TABLET | Freq: Two times a day (BID) | ORAL | 0 refills | Status: AC

## 2020-07-10 MED ORDER — HYDROXYZINE HCL 25 MG PO TABS
25.0000 mg | ORAL_TABLET | Freq: Three times a day (TID) | ORAL | Status: DC | PRN
  Administered 2020-07-10: 25 mg via ORAL
  Filled 2020-07-10: qty 1

## 2020-07-10 MED ORDER — FLUOXETINE HCL 20 MG PO CAPS
20.0000 mg | ORAL_CAPSULE | Freq: Every day | ORAL | Status: DC
  Administered 2020-07-10: 20 mg via ORAL
  Filled 2020-07-10: qty 7
  Filled 2020-07-10: qty 1

## 2020-07-10 MED ORDER — ALUM & MAG HYDROXIDE-SIMETH 200-200-20 MG/5ML PO SUSP: 30.0000 mL | ORAL | Status: DC | PRN

## 2020-07-10 MED ORDER — FLUOXETINE HCL 20 MG PO CAPS: 20.0000 mg | ORAL_CAPSULE | Freq: Every day | ORAL | 0 refills | Status: DC

## 2020-07-10 MED ORDER — LEVETIRACETAM 500 MG PO TABS: 500.0000 mg | ORAL_TABLET | Freq: Two times a day (BID) | ORAL | 0 refills | Status: DC

## 2020-07-10 MED ORDER — BIKTARVY 50-200-25 MG PO TABS: 1.0000 | ORAL_TABLET | Freq: Every day | ORAL | 0 refills | Status: DC

## 2020-07-10 MED ORDER — TRAZODONE HCL 50 MG PO TABS
50.0000 mg | ORAL_TABLET | Freq: Every evening | ORAL | Status: DC | PRN
  Administered 2020-07-10: 50 mg via ORAL
  Filled 2020-07-10: qty 1

## 2020-07-10 MED ORDER — BICTEGRAVIR-EMTRICITAB-TENOFOV 50-200-25 MG PO TABS
1.0000 | ORAL_TABLET | Freq: Every day | ORAL | Status: DC
  Administered 2020-07-10: 1 via ORAL
  Filled 2020-07-10: qty 1
  Filled 2020-07-10: qty 5

## 2020-07-10 MED ORDER — DOXYCYCLINE HYCLATE 100 MG PO TABS
100.0000 mg | ORAL_TABLET | Freq: Two times a day (BID) | ORAL | Status: DC
  Administered 2020-07-10: 100 mg via ORAL
  Filled 2020-07-10: qty 1
  Filled 2020-07-10: qty 6

## 2020-07-10 MED ORDER — ACETAMINOPHEN 325 MG PO TABS: 650.0000 mg | ORAL_TABLET | Freq: Four times a day (QID) | ORAL | Status: DC | PRN

## 2020-07-10 MED ORDER — MAGNESIUM HYDROXIDE 400 MG/5ML PO SUSP: 30.0000 mL | Freq: Every day | ORAL | Status: DC | PRN

## 2020-07-10 MED ORDER — TRAZODONE HCL 50 MG PO TABS: 50.0000 mg | ORAL_TABLET | Freq: Every evening | ORAL | Status: DC | PRN

## 2020-07-10 MED ORDER — LEVETIRACETAM 500 MG PO TABS
500.0000 mg | ORAL_TABLET | Freq: Two times a day (BID) | ORAL | Status: DC
  Administered 2020-07-10 (×2): 500 mg via ORAL
  Filled 2020-07-10: qty 1
  Filled 2020-07-10: qty 14
  Filled 2020-07-10: qty 1

## 2020-07-10 NOTE — ED Notes (Signed)
Pt given 2 sandwiches and 2 cups of apple juice.
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
## 2020-07-10 NOTE — ED Notes (Signed)
Pt resting with eyes closed. Rise and fall of chest noted. No new issues noted at this time. Will continue to monitor for safety 

## 2020-07-10 NOTE — ED Notes (Signed)
Discharge instructions provided, samples and prescriptions. Pt stated understanding. Safe transport called for services for [facility] in [place]. Safety maintained.
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
## 2020-07-10 NOTE — ED Notes (Signed)
Patient came up to nurses station asking nurse if he could smoke a cigarette. Nurse explained he called transportation for patient and when he was off of the property he would be able to smoke a cigarette. Patient became agitated and punched the glass to the nurses station yelling at staff. This Clinical research associate proceeded to hit the panic button after being told to call security by the nurse while the patient was still yelling at staff. NP Berneice Heinrich came in and was able to redirect the patient. Patient apologized to staff for the outburst.

## 2020-07-10 NOTE — Discharge Instructions (Signed)

## 2020-07-10 NOTE — ED Notes (Addendum)
Tivisha called from [facility] regarding pt clothing. I explained that returned all his belongings, consolidated in his 2 reusable grocery bags when he was discharged.

## 2020-07-10 NOTE — ED Provider Notes (Signed)
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 mg/dL Final  . Total CHOL/HDL Ratio 04/18/2020 2.8  RATIO Final  . VLDL 04/18/2020 8  0 - 40 mg/dL Final  . LDL Cholesterol 04/18/2020 98  0 - 99 mg/dL Final   Comment:        Total Cholesterol/HDL:CHD Risk Coronary Heart Disease Risk Table                     Men   Women  1/2 Average Risk   3.4   3.3  Average Risk       5.0   4.4  2 X Average Risk   9.6   7.1  3 X Average Risk  23.4   11.0        Use the calculated Patient Ratio above and the CHD Risk Table to determine the patient's CHD Risk.        ATP III CLASSIFICATION (LDL):  <100      mg/dL   Optimal  540-981  mg/dL   Near or Above                    Optimal  130-159  mg/dL   Borderline  191-478  mg/dL   High  >295     mg/dL   Very High Performed at [facility], [address] [place], [place] [address]   Admission on 04/10/2020, Discharged on 04/12/2020  Component Date Value Ref Range Status  . SARS Coronavirus 2 by RT PCR 04/10/2020 NEGATIVE  NEGATIVE Final   Comment: (NOTE) SARS-CoV-2 target nucleic acids are NOT DETECTED.  The SARS-CoV-2 RNA is generally detectable in upper respiratoy specimens during the acute phase of infection. The lowest concentration of SARS-CoV-2 viral copies this assay can detect is 131 copies/mL. A negative result does not preclude SARS-Cov-2 infection and should not be used as the sole basis for treatment or other patient management decisions. A negative result may occur with  improper specimen collection/handling, submission of specimen other than nasopharyngeal swab, presence of viral mutation(s) within the areas targeted by this assay, and inadequate number of  viral copies (<131 copies/mL). A negative result must be combined with clinical observations, patient history, and epidemiological information. The expected result is Negative.  Fact Sheet for Patients:  https://www.moore.com/https://www.fda.gov/media/142436/download  Fact Sheet for Healthcare Providers:  https://www.young.biz/https://www.fda.gov/media/142435/download  This test is no                          t yet approved or cleared by the [place] FDA and  has been authorized for detection and/or diagnosis of SARS-CoV-2 by FDA under an Emergency Use Authorization (EUA). This EUA will remain  in effect (meaning this test can be used) for the duration of the COVID-19 declaration under Section 564(b)(1) of the Act, 21 U.S.C. section 360bbb-3(b)(1), unless the authorization is terminated or revoked sooner.    . Influenza A by PCR 04/10/2020 NEGATIVE  NEGATIVE Final  . Influenza B by PCR  04/10/2020 NEGATIVE  NEGATIVE Final   Comment: (NOTE) The Xpert Xpress SARS-CoV-2/FLU/RSV assay is intended as an aid in  the diagnosis of influenza from Nasopharyngeal swab specimens and  should not be used as a sole basis for treatment. Nasal washings and  aspirates are unacceptable for Xpert Xpress SARS-CoV-2/FLU/RSV  testing.  Fact Sheet for Patients: https://www.moore.com/https://www.fda.gov/media/142436/download  Fact Sheet for Healthcare Providers: https://www.young.biz/https://www.fda.gov/media/142435/download  This test is not yet approved or cleared by the [place] FDA and  has been authorized for detection and/or diagnosis of SARS-CoV-2 by  FDA under an Emergency Use Authorization (EUA). This EUA will remain  in effect (meaning this test can be used) for the duration of the  Covid-19 declaration under Section 564(b)(1) of the Act, 21  U.S.C. section 360bbb-3(b)(1), unless the authorization is  terminated or revoked. Performed at [facility], [address] [place], [place] [address]   . Sodium 04/10/2020 141  135 - 145 mmol/L Final  . Potassium 04/10/2020 3.6  3.5 - 5.1 mmol/L Final  . Chloride 04/10/2020 103  98 - 111 mmol/L Final  . CO2 04/10/2020 26  22 - 32 mmol/L Final  . Glucose, Bld 04/10/2020 96  70 - 99 mg/dL Final   Glucose reference range applies only to samples taken after fasting for at least 8 hours.  . BUN 04/10/2020 16  6 - 20 mg/dL Final  . Creatinine, Ser 04/10/2020 1.18  0.61 - 1.24 mg/dL Final  . Calcium 60/45/409810/02/2020 9.6  8.9 - 10.3 mg/dL Final  . Total Protein 04/10/2020 8.1  6.5 - 8.1 g/dL Final  . Albumin 11/91/478210/02/2020 4.8  3.5 - 5.0 g/dL Final  . AST 95/62/130810/02/2020 30  15 - 41 U/L Final  . ALT 04/10/2020 29  0 - 44 U/L Final  . Alkaline Phosphatase 04/10/2020 85  38 - 126 U/L Final  . Total Bilirubin 04/10/2020 2.1* 0.3 - 1.2 mg/dL Final  . GFR, Estimated 04/10/2020 >60  >60 mL/min Final  . Anion gap 04/10/2020 12  5 - 15 Final   Performed at [facility], [address] [place], [place] [address]  . Alcohol, Ethyl (B) 04/10/2020 <10  <10 mg/dL Final   Comment: (NOTE) Lowest detectable limit for serum alcohol is 10 mg/dL.  For medical purposes only. Performed at [facility], [address] [place], [place] [address]   . Opiates 04/11/2020 NONE DETECTED  NONE DETECTED Final  . Cocaine 04/11/2020 NONE DETECTED  NONE DETECTED Final  . Benzodiazepines 04/11/2020 NONE DETECTED  NONE DETECTED Final  . Amphetamines 04/11/2020 POSITIVE*  NONE DETECTED Final  . Tetrahydrocannabinol 04/11/2020 NONE DETECTED  NONE DETECTED Final  . Barbiturates 04/11/2020 NONE DETECTED  NONE DETECTED Final   Comment: (NOTE) DRUG SCREEN FOR MEDICAL PURPOSES ONLY.  IF CONFIRMATION IS NEEDED FOR ANY PURPOSE, NOTIFY LAB WITHIN 5 DAYS.  LOWEST DETECTABLE LIMITS FOR URINE DRUG SCREEN Drug Class                     Cutoff (ng/mL) Amphetamine and metabolites    1000 Barbiturate and metabolites    200 Benzodiazepine                 841 Tricyclics and metabolites     300 Opiates and metabolites        300 Cocaine and metabolites        300 THC                            50 Performed at [facility], [place] [address] [place], [place] [address]   . WBC 04/10/2020 10.1  4.0 - 10.5 K/uL Final  . RBC 04/10/2020 4.68  4.22 - 5.81 MIL/uL Final  . Hemoglobin 04/10/2020 15.0  13.0 - 17.0 g/dL Final  . HCT 04/10/2020 45.1  39.0 - 52.0 % Final  . MCV 04/10/2020 96.4  80.0 - 100.0 fL Final  . MCH 04/10/2020 32.1  26.0 - 34.0 pg Final  . MCHC 04/10/2020 33.3  30.0 - 36.0 g/dL Final  . RDW 04/10/2020 12.3  11.5 - 15.5 % Final  . Platelets 04/10/2020 243  150 - 400 K/uL Final  . nRBC 04/10/2020 0.0  0.0 - 0.2 % Final  . Neutrophils Relative % 04/10/2020 67  % Final  . Neutro Abs 04/10/2020 6.7  1.7 - 7.7 K/uL Final  . Lymphocytes Relative 04/10/2020 20  % Final  . Lymphs Abs 04/10/2020 2.1  0.7 - 4.0 K/uL Final  . Monocytes  Relative 04/10/2020 13  % Final  . Monocytes Absolute 04/10/2020 1.3* 0.1 - 1.0 K/uL Final  . Eosinophils Relative 04/10/2020 0  % Final  . Eosinophils Absolute 04/10/2020 0.0  0.0 - 0.5 K/uL Final  . Basophils Relative 04/10/2020 0  % Final  . Basophils Absolute 04/10/2020 0.0  0.0 - 0.1 K/uL Final  . Immature Granulocytes 04/10/2020 0  % Final  . Abs Immature Granulocytes 04/10/2020 0.04  0.00 - 0.07 K/uL Final   Performed at [facility], [place] [address] [place], [place] [address]  Admission on 03/26/2020, Discharged on 03/27/2020  Component Date Value Ref Range Status  . WBC 03/26/2020 7.1  4.0 - 10.5 K/uL Final  . RBC 03/26/2020 5.23  4.22 - 5.81 MIL/uL Final  . Hemoglobin 03/26/2020 17.1* 13.0 - 17.0 g/dL Final  . HCT 03/26/2020 50.8  39.0 - 52.0 % Final  . MCV 03/26/2020 97.1  80.0 - 100.0 fL Final  . MCH 03/26/2020 32.7  26.0 - 34.0 pg Final  . MCHC 03/26/2020 33.7  30.0 - 36.0 g/dL Final  . RDW 03/26/2020 12.2  11.5 - 15.5 % Final  . Platelets 03/26/2020 233  150 - 400 K/uL Final  . nRBC 03/26/2020 0.0  0.0 - 0.2 % Final  . Neutrophils Relative % 03/26/2020 50  % Final  . Neutro Abs 03/26/2020 3.5  1.7 - 7.7 K/uL Final  . Lymphocytes Relative 03/26/2020 37  % Final  . Lymphs Abs 03/26/2020 2.6  0.7 - 4.0  K/uL Final  . Monocytes Relative 03/26/2020 11  % Final  . Monocytes Absolute 03/26/2020 0.8  0.1 - 1.0 K/uL Final  . Eosinophils Relative 03/26/2020 1  % Final  . Eosinophils Absolute 03/26/2020 0.1  0.0 - 0.5 K/uL Final  . Basophils Relative 03/26/2020 1  % Final  . Basophils Absolute 03/26/2020 0.0  0.0 - 0.1 K/uL Final  . Immature Granulocytes 03/26/2020 0  % Final  . Abs Immature Granulocytes 03/26/2020 0.02  0.00 - 0.07 K/uL Final   Performed at [facility], [address] [place], [place] [address]  . Sodium 03/26/2020 142  135 - 145 mmol/L Final  . Potassium 03/26/2020 3.8  3.5 - 5.1 mmol/L Final  . Chloride 03/26/2020 102   98 - 111 mmol/L Final  . CO2 03/26/2020 25  22 - 32 mmol/L Final  . Glucose, Bld 03/26/2020 96  70 - 99 mg/dL Final   Glucose reference range applies only to samples taken after fasting for at least 8 hours.  . BUN 03/26/2020 13  6 - 20 mg/dL Final  . Creatinine, Ser 03/26/2020 1.15  0.61 - 1.24 mg/dL Final  . Calcium 60/04/9322 10.2  8.9 - 10.3 mg/dL Final  . Total Protein 03/26/2020 8.7* 6.5 - 8.1 g/dL Final  . Albumin 55/73/2202 4.9  3.5 - 5.0 g/dL Final  . AST 54/27/0623 37  15 - 41 U/L Final  . ALT 03/26/2020 36  0 - 44 U/L Final  . Alkaline Phosphatase 03/26/2020 86  38 - 126 U/L Final  . Total Bilirubin 03/26/2020 1.8* 0.3 - 1.2 mg/dL Final  . GFR calc non Af Amer 03/26/2020 >60  >60 mL/min Final  . GFR calc Af Amer 03/26/2020 >60  >60 mL/min Final  . Anion gap 03/26/2020 15  5 - 15 Final   Performed at [facility], [address] [place], [place] [address]  . SARS Coronavirus 2 by RT PCR 03/26/2020 NEGATIVE  NEGATIVE Final   Comment: (NOTE) SARS-CoV-2 target nucleic acids are NOT DETECTED.  The SARS-CoV-2 RNA is generally detectable in upper respiratoy specimens during the acute phase of infection. The lowest concentration of SARS-CoV-2 viral copies this assay can detect is 131 copies/mL. A negative result does not preclude SARS-Cov-2 infection and should not be used as the sole basis for treatment or other patient management decisions. A negative result may occur with  improper specimen collection/handling, submission of specimen other than nasopharyngeal swab, presence of viral mutation(s) within the areas targeted by this assay, and inadequate number of viral copies (<131 copies/mL). A negative result must be combined with clinical observations, patient history, and epidemiological information. The expected result is Negative.  Fact Sheet for Patients:  https://www.moore.com/  Fact Sheet for Healthcare Providers:   https://www.young.biz/  This test is no                          t yet approved or cleared by the [place] FDA and  has been authorized for detection and/or diagnosis of SARS-CoV-2 by FDA under an Emergency Use Authorization (EUA). This EUA will remain  in effect (meaning this test can be used) for the duration of the COVID-19 declaration under Section 564(b)(1) of the Act, 21 U.S.C. section 360bbb-3(b)(1), unless the authorization is terminated or revoked sooner.    . Influenza A by PCR 03/26/2020 NEGATIVE  NEGATIVE Final  . Influenza B by PCR 03/26/2020 NEGATIVE  NEGATIVE Final  Comment: (NOTE) The Xpert Xpress SARS-CoV-2/FLU/RSV assay is intended as an aid in  the diagnosis of influenza from Nasopharyngeal swab specimens and  should not be used as a sole basis for treatment. Nasal washings and  aspirates are unacceptable for Xpert Xpress SARS-CoV-2/FLU/RSV  testing.  Fact Sheet for Patients: https://www.moore.com/  Fact Sheet for Healthcare Providers: https://www.young.biz/  This test is not yet approved or cleared by the [place] FDA and  has been authorized for detection and/or diagnosis of SARS-CoV-2 by  FDA under an Emergency Use Authorization (EUA). This EUA will remain  in effect (meaning this test can be used) for the duration of the  Covid-19 declaration under Section 564(b)(1) of the Act, 21  U.S.C. section 360bbb-3(b)(1), unless the authorization is  terminated or revoked.   [name] Respiratory Syncytial Virus by PCR 03/26/2020 NEGATIVE  NEGATIVE Final   Comment: (NOTE) Fact Sheet for Patients: https://www.moore.com/  Fact Sheet for Healthcare Providers: https://www.young.biz/  This test is not yet approved or cleared by the [place] FDA and  has been authorized for detection and/or diagnosis of SARS-CoV-2 by  FDA under an Emergency Use Authorization  (EUA). This EUA will remain  in effect (meaning this test can be used) for the duration of the  COVID-19 declaration under Section 564(b)(1) of the Act, 21 U.S.C.  section 360bbb-3(b)(1), unless the authorization is terminated or  revoked. Performed at [facility], [address] [place], [place] [address]   . Opiates 03/26/2020 NONE DETECTED  NONE DETECTED Final  . Cocaine 03/26/2020 NONE DETECTED  NONE DETECTED Final  . Benzodiazepines 03/26/2020 POSITIVE* NONE DETECTED Final  . Amphetamines 03/26/2020 POSITIVE* NONE DETECTED Final  . Tetrahydrocannabinol 03/26/2020 POSITIVE* NONE DETECTED Final  . Barbiturates 03/26/2020 NONE DETECTED  NONE DETECTED Final   Comment: (NOTE) DRUG SCREEN FOR MEDICAL PURPOSES ONLY.  IF CONFIRMATION IS NEEDED FOR ANY PURPOSE, NOTIFY LAB WITHIN 5 DAYS.  LOWEST DETECTABLE LIMITS FOR URINE DRUG SCREEN Drug Class                     Cutoff (ng/mL) Amphetamine and metabolites    1000 Barbiturate and metabolites    200 Benzodiazepine                 200 Tricyclics and metabolites     300 Opiates and metabolites        300 Cocaine and metabolites        300 THC                            50 Performed at [facility], [address] [place], [place] [address]   . Alcohol, Ethyl (B) 03/26/2020 <10  <10 mg/dL Final   Comment: (NOTE) Lowest detectable limit for serum alcohol is 10 mg/dL.  For medical purposes only. Performed at [facility], [address] [place], [place] [address]   . Salicylate Lvl 03/26/2020 <7.0* 7.0 - 30.0 mg/dL Final   Performed at [facility], [address] [place], [place] [address]  . Acetaminophen (Tylenol), Serum 03/26/2020 <10* 10 - 30 ug/mL Final   Comment: (NOTE) Therapeutic concentrations vary significantly. A range of 10-30 ug/mL  may be an effective concentration for many patients. However, some  are best treated at concentrations  outside of this range. Acetaminophen concentrations >150 ug/mL at 4 hours after ingestion  and >50 ug/mL at 12 hours after ingestion  are often associated with  toxic reactions.  Performed at [facility], [address], [place], [place] [address]   Admission on 03/22/2020, Discharged on 03/23/2020  Component Date Value Ref Range Status  . SARS Coronavirus 2 03/22/2020 NEGATIVE  NEGATIVE Final   Comment: (NOTE) SARS-CoV-2 target nucleic acids are NOT DETECTED.  The SARS-CoV-2 RNA is generally detectable in upper and lower respiratory specimens during the acute phase of infection. The lowest concentration of SARS-CoV-2 viral copies this assay can detect is 250 copies / mL. A negative result does not preclude SARS-CoV-2 infection and should not be used as the sole basis for treatment or other patient management decisions.  A negative result may occur with improper specimen collection / handling, submission of specimen other than nasopharyngeal swab, presence of viral mutation(s) within the areas targeted by this assay, and inadequate number of viral copies (<250 copies / mL). A negative result must be combined with clinical observations, patient history, and epidemiological information.  Fact Sheet for Patients:   BoilerBrush.com.cy  Fact Sheet for Healthcare Providers: https://pope.com/  This test is not yet approved or                           cleared by the [place] FDA and has been authorized for detection and/or diagnosis of SARS-CoV-2 by FDA under an Emergency Use Authorization (EUA).  This EUA will remain in effect (meaning this test can be used) for the duration of the COVID-19 declaration under Section 564(b)(1) of the Act, 21 U.S.C. section 360bbb-3(b)(1), unless the authorization is terminated or revoked sooner.  Performed at [facility], [address], [place], [place]  [address]   . Sodium 03/22/2020 137  135 - 145 mmol/L Final  . Potassium 03/22/2020 3.2* 3.5 - 5.1 mmol/L Final  . Chloride 03/22/2020 101  98 - 111 mmol/L Final  . CO2 03/22/2020 21* 22 - 32 mmol/L Final  . Glucose, Bld 03/22/2020 144* 70 - 99 mg/dL Final   Glucose reference range applies only to samples taken after fasting for at least 8 hours.  . BUN 03/22/2020 10  6 - 20 mg/dL Final  . Creatinine, Ser 03/22/2020 1.03  0.61 - 1.24 mg/dL Final  . Calcium 09/81/1914 9.7  8.9 - 10.3 mg/dL Final  . Total Protein 03/22/2020 8.1  6.5 - 8.1 g/dL Final  . Albumin 78/29/5621 5.0  3.5 - 5.0 g/dL Final  . AST 30/86/5784 70* 15 - 41 U/L Final  . ALT 03/22/2020 49* 0 - 44 U/L Final  . Alkaline Phosphatase 03/22/2020 78  38 - 126 U/L Final  . Total Bilirubin 03/22/2020 3.8* 0.3 - 1.2 mg/dL Final  . GFR calc non Af Amer 03/22/2020 >60  >60 mL/min Final  . GFR calc Af Amer 03/22/2020 >60  >60 mL/min Final  . Anion gap 03/22/2020 15  5 - 15 Final   Performed at [facility], [address] [place], [place] [address]  . Alcohol, Ethyl (B) 03/22/2020 <10  <10 mg/dL Final   Comment: (NOTE) Lowest detectable limit for serum alcohol is 10 mg/dL.  For medical purposes only. Performed at [facility], [address] [place], [place] [address]   . Opiates 03/22/2020 NONE DETECTED  NONE DETECTED Final  . Cocaine 03/22/2020 NONE DETECTED  NONE DETECTED Final  . Benzodiazepines 03/22/2020 POSITIVE* NONE DETECTED Final  . Amphetamines 03/22/2020 POSITIVE* NONE DETECTED Final  . Tetrahydrocannabinol  03/22/2020 NONE DETECTED  NONE DETECTED Final  . Barbiturates 03/22/2020 NONE DETECTED  NONE DETECTED Final   Comment: (NOTE) DRUG SCREEN FOR MEDICAL PURPOSES ONLY.  IF CONFIRMATION IS NEEDED FOR ANY PURPOSE, NOTIFY LAB WITHIN 5 DAYS.  LOWEST DETECTABLE LIMITS FOR URINE DRUG SCREEN Drug Class                     Cutoff (ng/mL) Amphetamine and metabolites     1000 Barbiturate and metabolites    200 Benzodiazepine                 200 Tricyclics and metabolites     300 Opiates and metabolites        300 Cocaine and metabolites        300 THC                            50 Performed at [facility], [address] [place], [place] [address]   . WBC 03/22/2020 7.3  4.0 - 10.5 K/uL Final  . RBC 03/22/2020 4.98  4.22 - 5.81 MIL/uL Final  . Hemoglobin 03/22/2020 16.2  13.0 - 17.0 g/dL Final  . HCT 60/45/4098 47.5  39.0 - 52.0 % Final  . MCV 03/22/2020 95.4  80.0 - 100.0 fL Final  . MCH 03/22/2020 32.5  26.0 - 34.0 pg Final  . MCHC 03/22/2020 34.1  30.0 - 36.0 g/dL Final  . RDW 11/91/4782 12.5  11.5 - 15.5 % Final  . Platelets 03/22/2020 191  150 - 400 K/uL Final  . nRBC 03/22/2020 0.0  0.0 - 0.2 % Final  . Neutrophils Relative % 03/22/2020 67  % Final  . Neutro Abs 03/22/2020 4.8  1.7 - 7.7 K/uL Final  . Lymphocytes Relative 03/22/2020 22  % Final  . Lymphs Abs 03/22/2020 1.6  0.7 - 4.0 K/uL Final  . Monocytes Relative 03/22/2020 11  % Final  . Monocytes Absolute 03/22/2020 0.8  0.1 - 1.0 K/uL Final  . Eosinophils Relative 03/22/2020 0  % Final  . Eosinophils Absolute 03/22/2020 0.0  0.0 - 0.5 K/uL Final  . Basophils Relative 03/22/2020 0  % Final  . Basophils Absolute 03/22/2020 0.0  0.0 - 0.1 K/uL Final  . Immature Granulocytes 03/22/2020 0  % Final  . Abs Immature Granulocytes 03/22/2020 0.01  0.00 - 0.07 K/uL Final   Performed at [facility], [address], [place], [place] [address]  . Salicylate Lvl 03/22/2020 <7.0* 7.0 - 30.0 mg/dL Final   Performed at [facility], [address] [place], [place] [address]  . Acetaminophen (Tylenol), Serum 03/22/2020 <10* 10 - 30 ug/mL Final   Comment: (NOTE) Therapeutic concentrations vary significantly. A range of 10-30 ug/mL  may be an effective concentration for many patients. However, some  are best treated at concentrations outside  of this range. Acetaminophen concentrations >150 ug/mL at 4 hours after ingestion  and >50 ug/mL at 12 hours after ingestion are often associated with  toxic reactions.  Performed at [facility], [address], [place], [place] [address]   Admission on 03/20/2020, Discharged on 03/21/2020  Component Date Value Ref Range Status  . Acetaminophen (Tylenol), Serum 03/20/2020 <10* 10 - 30 ug/mL Final   Comment: (NOTE) Therapeutic concentrations vary significantly. A range of 10-30 ug/mL  may be an effective concentration for many patients. However, some  are best treated at concentrations  outside of this range. Acetaminophen concentrations >150 ug/mL at 4 hours after ingestion  and >50 ug/mL at 12 hours after ingestion are often associated with  toxic reactions.  Performed at [facility], [address] [place], [place] [address]   . WBC 03/20/2020 8.5  4.0 - 10.5 K/uL Final  . RBC 03/20/2020 5.37  4.22 - 5.81 MIL/uL Final  . Hemoglobin 03/20/2020 17.5* 13.0 - 17.0 g/dL Final  . HCT 19/14/7829 50.5  39.0 - 52.0 % Final  . MCV 03/20/2020 94.0  80.0 - 100.0 fL Final  . MCH 03/20/2020 32.6  26.0 - 34.0 pg Final  . MCHC 03/20/2020 34.7  30.0 - 36.0 g/dL Final  . RDW 56/21/3086 12.5  11.5 - 15.5 % Final  . Platelets 03/20/2020 214  150 - 400 K/uL Final  . nRBC 03/20/2020 0.0  0.0 - 0.2 % Final   Performed at [facility], [address], [place], [place] [address]  . Sodium 03/20/2020 137  135 - 145 mmol/L Final   LYTES REPEATED / JAG  . Potassium 03/20/2020 3.4* 3.5 - 5.1 mmol/L Final  . Chloride 03/20/2020 99  98 - 111 mmol/L Final  . CO2 03/20/2020 12* 22 - 32 mmol/L Final  . Glucose, Bld 03/20/2020 150* 70 - 99 mg/dL Final   Glucose reference range applies only to samples taken after fasting for at least 8 hours.  . BUN 03/20/2020 13  6 - 20 mg/dL Final  . Creatinine, Ser 03/20/2020 1.38* 0.61 - 1.24 mg/dL Final  . Calcium 96/29/5284 9.9   8.9 - 10.3 mg/dL Final  . Total Protein 03/20/2020 8.8* 6.5 - 8.1 g/dL Final  . Albumin 13/24/4010 5.0  3.5 - 5.0 g/dL Final  . AST 27/25/3664 61* 15 - 41 U/L Final  . ALT 03/20/2020 46* 0 - 44 U/L Final   RESULT CONFIRMED BY MANUAL DILUTION / JAG  . Alkaline Phosphatase 03/20/2020 89  38 - 126 U/L Final  . Total Bilirubin 03/20/2020 3.3* 0.3 - 1.2 mg/dL Final  . GFR calc non Af Amer 03/20/2020 >60  >60 mL/min Final  . GFR calc Af Amer 03/20/2020 >60  >60 mL/min Final  . Anion gap 03/20/2020 26* 5 - 15 Final   Performed at [facility], [address] [place], [place] [address]  . Alcohol, Ethyl (B) 03/20/2020 <10  <10 mg/dL Final   Comment: (NOTE) Lowest detectable limit for serum alcohol is 10 mg/dL.  For medical purposes only. Performed at [facility], [address], [place], [place] [address]   . Salicylate Lvl 03/20/2020 <7.0* 7.0 - 30.0 mg/dL Final   Performed at [facility], [address] [place] [place], [place] [address]  . Total CK 03/20/2020 401* 49 - 397 U/L Final   Performed at [facility], [address] [place], [place] [address]  Office Visit on 02/13/2020  Component Date Value Ref Range Status  . CD4 T Cell Abs 02/13/2020 429  400 - 1,790 /uL Final  . CD4 % Helper T Cell 02/13/2020 33  33 - 65 % Final   Performed at [facility], [address] [place], [place] [address]  . HIV 1 RNA Quant 02/13/2020 <20  Copies/mL Final   Comment: HIV-1 RNA Not Detected .   [name] HIV-1 RNA Quant, Log 02/13/2020 <1.30  Log cps/mL Final   Comment: HIV-1 RNA Not Detected . Reference Range:  Not Detected     copies/mL                           Not Detected Log copies/mL . [name] The test was performed using Real-Time Polymerase Chain Reaction. . . Reportable Range: 20 copies/mL to 10,000,000 copies/mL (1.30 Log copies/mL to 7.00 Log copies/mL). .   . Glucose, Bld 02/13/2020 84  65 - 99 mg/dL  Final   Comment: .            Fasting reference interval .   . BUN 02/13/2020 18  7 - 25 mg/dL Final  . Creat 16/04/9603 1.32  0.60 - 1.35 mg/dL Final  . GFR, Est Non African American 02/13/2020 67  > OR = 60 mL/min/1.63m2 Final  . GFR, Est African American 02/13/2020 78  > OR = 60 mL/min/1.18m2 Final  . BUN/Creatinine Ratio 02/13/2020 NOT APPLICABLE  6 - 22 (calc) Final  . Sodium 02/13/2020 143  135 - 146 mmol/L Final  . Potassium 02/13/2020 4.6  3.5 - 5.3 mmol/L Final  . Chloride 02/13/2020 105  98 - 110 mmol/L Final  . CO2 02/13/2020 31  20 - 32 mmol/L Final  . Calcium 02/13/2020 9.9  8.6 - 10.3 mg/dL Final  . Total Protein 02/13/2020 6.9  6.1 - 8.1 g/dL Final  . Albumin 54/03/8118 4.2  3.6 - 5.1 g/dL Final  . Globulin 14/78/2956 2.7  1.9 - 3.7 g/dL (calc) Final  . AG Ratio 02/13/2020 1.6  1.0 - 2.5 (calc) Final  . Total Bilirubin 02/13/2020 0.6  0.2 - 1.2 mg/dL Final  . Alkaline phosphatase (APISO) 02/13/2020 100  36 - 130 U/L Final  . AST 02/13/2020 14  10 - 40 U/L Final  . ALT 02/13/2020 17  9 - 46 U/L Final  . WBC 02/13/2020 5.8  3.8 - 10.8 Thousand/uL Final  . RBC 02/13/2020 4.96  4.20 - 5.80 Million/uL Final  . Hemoglobin 02/13/2020 16.0  13.2 - 17.1 g/dL Final  . HCT 21/30/8657 47.0  38.5 - 50.0 % Final  . MCV 02/13/2020 94.8  80.0 - 100.0 fL Final  . MCH 02/13/2020 32.3

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
## 2020-07-10 NOTE — Discharge Instructions (Addendum)
You were evaluated in the Emergency Department and after careful evaluation, we did not find any emergent condition requiring admission or further testing in the hospital.  Your exam/testing today was overall reassuring.  Please return to the Emergency Department if you experience any worsening of your condition.  Thank you for allowing us to be a part of your care.  

## 2020-07-10 NOTE — ED Provider Notes (Incomplete)
[facility]EMER[facility]ENCY DEPT [facility] Emergency Department Provider Note MRN:  [id number]  Arrival date & time: 07/10/20     Chief Complaint   chronic SI/foot pain   History of Present Illness   Justin [name] is a 40 y.o. year-old male with a history of *** presenting to the ED with chief complaint of ***.  ***  Review of Systems  A complete 10 system review of systems was obtained and all systems are negative except as noted in the HPI and PMH. ***  Patient's Health History    Past Medical History:  Diagnosis Date  . Endocarditis   . Hepatitis C   . History of syphilis 08/25/2014   Treated by [facility] 07-2014.  [name] HIV (human immunodeficiency virus infection) (HCC)   . Seizures (HCC)     Past Surgical History:  Procedure Laterality Date  . hand surgery Right   . LUMBAR PUNCTURE  08/08/2019      . WRIST SURGERY      Family History  Adopted: Yes  Family history unknown: Yes    Social History   Socioeconomic History  . Marital status: Widowed    Spouse name: Not on file  . Number of children: Not on file  . Years of education: Not on file  . Highest education level: Not on file  Occupational History  . Not on file  Tobacco Use  . Smoking status: Current Every Day Smoker    Types: Cigarettes  . Smokeless tobacco: Never Used  Vaping Use  . Vaping Use: Never used  Substance and Sexual Activity  . Alcohol use: Yes    Comment: 1-2 a week   . Drug use: Yes    Frequency: 14.0 times per week    Types: Marijuana, Methamphetamines  . Sexual activity: Yes    Partners: Male    Birth control/protection: Condom    Comment: given condoms  Other Topics Concern  . Not on file  Social History Narrative   ** Merged History Encounter **       ** Merged History Encounter **       ** Merged History Encounter **       Social Determinants of Corporate investment banker Strain: Not on file  Food Insecurity: Not on file  Transportation Needs: Not on file  Physical  Activity: Not on file  Stress: Not on file  Social Connections: Not on file  Intimate Partner Violence: Not on file     Physical Exam   Vitals:   07/09/20 1704 07/09/20 2127  BP: 125/76 (!) 135/92  Pulse: (!) 112 82  Resp: 18 17  Temp: 98.2 F (36.8 C) 98.8 F (37.1 C)  SpO2: 95% 99%    CONSTITUTIONAL:  ***-appearing, NAD NEURO:  Alert and oriented x 3, no focal deficits EYES:  eyes equal and reactive ENT/NECK:  no LAD, no JVD CARDIO:  *** rate, well-perfused, normal S1 and S2 PULM:  CTAB no wheezing or rhonchi GI/GU:  normal bowel sounds, non-distended, non-tender MSK/SPINE:  No gross deformities, no edema SKIN:  no rash, atraumatic*** PSYCH:  Appropriate speech and behavior  *Additional and/or pertinent findings included in MDM below  Diagnostic and Interventional Summary    EKG Interpretation  Date/Time:    Ventricular Rate:    PR Interval:    QRS Duration:   QT Interval:    QTC Calculation:   R Axis:     Text Interpretation:       *** Labs Reviewed -  No data to display  No orders to display    Medications - No data to display   Procedures  /  Critical Care Procedures  ED Course and Medical Decision Making  I have reviewed the triage vital signs, the nursing notes, and pertinent available records from the EMR.  Listed above are laboratory and imaging tests that I personally ordered, reviewed, and interpreted and then considered in my medical decision making (see below for details).  ***     ***  Elmer Sow. Pilar Plate, MD [facility] Emergency Medicine [facility] [email or web address]  Final Clinical Impressions(s) / ED Diagnoses  No diagnosis found.  ED Discharge Orders    None       Discharge Instructions Discussed with and Provided to Patient:   Discharge Instructions   None
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
## 2020-07-12 DIAGNOSIS — T148XXA Other injury of unspecified body region, initial encounter: Secondary | ICD-10-CM

## 2020-07-12 DIAGNOSIS — R45851 Suicidal ideations: Secondary | ICD-10-CM

## 2020-07-13 ENCOUNTER — Telehealth: Payer: Self-pay | Admitting: *Deleted
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
## 2020-07-16 ENCOUNTER — Telehealth: Payer: Self-pay

## 2020-07-16 ENCOUNTER — Telehealth (HOSPITAL_COMMUNITY): Payer: Self-pay | Admitting: General Practice

## 2020-07-16 ENCOUNTER — Other Ambulatory Visit: Payer: Self-pay | Admitting: Internal Medicine

## 2020-07-16 MED ORDER — BIKTARVY 50-200-25 MG PO TABS: 1.0000 | ORAL_TABLET | Freq: Every day | ORAL | 5 refills | Status: DC

## 2020-07-16 MED ORDER — LEVETIRACETAM 500 MG PO TABS: 500.0000 mg | ORAL_TABLET | Freq: Two times a day (BID) | ORAL | 5 refills | Status: DC

## 2020-07-16 NOTE — Telephone Encounter (Signed)
Yes, I am ok refilling the Keppra and Biktarvy and agree, no antibiotics. I will send in.  thanks

## 2020-07-16 NOTE — Telephone Encounter (Signed)
Spoke with patient to let him know that Dr. [name] has sent in [place] and Keppra refills to the [facility] on [address]. RN spoke to [employer] and they will fill the Keppra, but it may be too soon for the [place].   RN advised patient that if the pharmacy will not fill his Biktarvy, we can provide him with some samples when he comes in for labs tomorrow 07/17/20. Patient says he has one pill left. Patient verbalized understanding and has no further questions.   Sandie Ano, RN
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
## 2020-07-16 NOTE — Telephone Encounter (Signed)
Care Management - Follow Up [facility] Discharges   Writer attempted to make contact with patient today and was unsuccessful.  Writer was able to leave a HIPPA compliant voice message and will await callback.   

## 2020-07-17 ENCOUNTER — Other Ambulatory Visit: Payer: Self-pay

## 2020-07-17 ENCOUNTER — Ambulatory Visit: Payer: Self-pay

## 2020-07-17 DIAGNOSIS — B20 Human immunodeficiency virus [HIV] disease: Secondary | ICD-10-CM

## 2020-07-17 DIAGNOSIS — B182 Chronic viral hepatitis C: Secondary | ICD-10-CM

## 2020-07-17 DIAGNOSIS — Z113 Encounter for screening for infections with a predominantly sexual mode of transmission: Secondary | ICD-10-CM

## 2020-07-22 ENCOUNTER — Encounter: Payer: Self-pay | Admitting: Internal Medicine

## 2020-07-29 ENCOUNTER — Other Ambulatory Visit: Payer: Self-pay

## 2020-08-07 ENCOUNTER — Ambulatory Visit (HOSPITAL_COMMUNITY)
Admission: EM | Admit: 2020-08-07 | Discharge: 2020-08-07 | Disposition: A | Discharge status: Other Acute Inpatient Hospital | Payer: No Payment, Other | Source: Home / Self Care

## 2020-08-07 ENCOUNTER — Other Ambulatory Visit: Payer: Self-pay

## 2020-08-07 ENCOUNTER — Emergency Department (HOSPITAL_COMMUNITY): Payer: Self-pay

## 2020-08-07 ENCOUNTER — Encounter (HOSPITAL_COMMUNITY): Payer: Self-pay | Admitting: Emergency Medicine

## 2020-08-07 ENCOUNTER — Ambulatory Visit (HOSPITAL_COMMUNITY)
Admission: EM | Admit: 2020-08-07 | Discharge: 2020-08-07 | Disposition: A | Discharge status: Home / Self Care | Payer: No Payment, Other | Attending: Urology | Admitting: Urology

## 2020-08-07 ENCOUNTER — Emergency Department (HOSPITAL_COMMUNITY)
Admission: EM | Admit: 2020-08-07 | Discharge: 2020-08-10 | Disposition: A | Discharge status: Home / Self Care | Payer: Self-pay | Attending: Emergency Medicine | Admitting: Emergency Medicine

## 2020-08-07 DIAGNOSIS — R52 Pain, unspecified: Secondary | ICD-10-CM

## 2020-08-07 DIAGNOSIS — Z733 Stress, not elsewhere classified: Secondary | ICD-10-CM | POA: Diagnosis not present

## 2020-08-07 DIAGNOSIS — F17210 Nicotine dependence, cigarettes, uncomplicated: Secondary | ICD-10-CM | POA: Insufficient documentation

## 2020-08-07 DIAGNOSIS — F22 Delusional disorders: Secondary | ICD-10-CM | POA: Insufficient documentation

## 2020-08-07 DIAGNOSIS — F1520 Other stimulant dependence, uncomplicated: Secondary | ICD-10-CM | POA: Diagnosis present

## 2020-08-07 DIAGNOSIS — Z20822 Contact with and (suspected) exposure to covid-19: Secondary | ICD-10-CM | POA: Insufficient documentation

## 2020-08-07 DIAGNOSIS — F1514 Other stimulant abuse with stimulant-induced mood disorder: Secondary | ICD-10-CM | POA: Insufficient documentation

## 2020-08-07 DIAGNOSIS — F1594 Other stimulant use, unspecified with stimulant-induced mood disorder: Secondary | ICD-10-CM | POA: Diagnosis present

## 2020-08-07 DIAGNOSIS — Z653 Problems related to other legal circumstances: Secondary | ICD-10-CM | POA: Diagnosis not present

## 2020-08-07 DIAGNOSIS — F1510 Other stimulant abuse, uncomplicated: Secondary | ICD-10-CM | POA: Diagnosis not present

## 2020-08-07 DIAGNOSIS — Z21 Asymptomatic human immunodeficiency virus [HIV] infection status: Secondary | ICD-10-CM | POA: Insufficient documentation

## 2020-08-07 LAB — COMPREHENSIVE METABOLIC PANEL
ALT: 31 U/L (ref 0–44)
AST: 38 U/L (ref 15–41)
Albumin: 4.5 g/dL (ref 3.5–5.0)
Alkaline Phosphatase: 80 U/L (ref 38–126)
Anion gap: 15 (ref 5–15)
BUN: 11 mg/dL (ref 6–20)
CO2: 25 mmol/L (ref 22–32)
Calcium: 10.3 mg/dL (ref 8.9–10.3)
Chloride: 101 mmol/L (ref 98–111)
Creatinine, Ser: 1.27 mg/dL — ABNORMAL HIGH (ref 0.61–1.24)
GFR, Estimated: >60 mL/min (ref >60)
Glucose, Bld: 84 mg/dL (ref 70–99)
Potassium: 4.6 mmol/L (ref 3.5–5.1)
Sodium: 141 mmol/L (ref 135–145)
Total Bilirubin: 3 mg/dL — ABNORMAL HIGH (ref 0.3–1.2)
Total Protein: 7.8 g/dL (ref 6.5–8.1)

## 2020-08-07 LAB — CBC
HCT: 49.6 % (ref 39.0–52.0)
Hemoglobin: 16.3 g/dL (ref 13.0–17.0)
MCH: 32.8 pg (ref 26.0–34.0)
MCHC: 32.9 g/dL (ref 30.0–36.0)
MCV: 99.8 fL (ref 80.0–100.0)
Platelets: 217 10*3/uL (ref 150–400)
RBC: 4.97 MIL/uL (ref 4.22–5.81)
RDW: 13.1 % (ref 11.5–15.5)
WBC: 8.4 10*3/uL (ref 4.0–10.5)
nRBC: 0 % (ref 0.0–0.2)

## 2020-08-07 LAB — RAPID URINE DRUG SCREEN, HOSP PERFORMED
Amphetamines: POSITIVE — AB
Barbiturates: NOT DETECTED
Benzodiazepines: NOT DETECTED
Cocaine: NOT DETECTED
Opiates: NOT DETECTED
Tetrahydrocannabinol: NOT DETECTED

## 2020-08-07 LAB — SARS CORONAVIRUS 2 BY RT PCR (HOSPITAL ORDER, PERFORMED IN ~~LOC~~ HOSPITAL LAB): SARS Coronavirus 2: NEGATIVE

## 2020-08-07 LAB — ETHANOL: Alcohol, Ethyl (B): <10 mg/dL (ref <10)

## 2020-08-07 LAB — SALICYLATE LEVEL: Salicylate Lvl: <7 mg/dL — ABNORMAL LOW (ref 7.0–30.0)

## 2020-08-07 LAB — ACETAMINOPHEN LEVEL: Acetaminophen (Tylenol), Serum: <10 ug/mL — ABNORMAL LOW (ref 10–30)

## 2020-08-07 MED ORDER — BICTEGRAVIR-EMTRICITAB-TENOFOV 50-200-25 MG PO TABS
1.0000 | ORAL_TABLET | Freq: Every day | ORAL | Status: DC
  Administered 2020-08-08 – 2020-08-10 (×3): 1 via ORAL
  Filled 2020-08-07 (×3): qty 1

## 2020-08-07 MED ORDER — ZIPRASIDONE MESYLATE 20 MG IM SOLR
20.0000 mg | Freq: Once | INTRAMUSCULAR | Status: AC
  Administered 2020-08-07: 20 mg via INTRAMUSCULAR
  Filled 2020-08-07: qty 20

## 2020-08-07 MED ORDER — LEVETIRACETAM 500 MG PO TABS
500.0000 mg | ORAL_TABLET | Freq: Once | ORAL | Status: AC
  Administered 2020-08-07: 500 mg via ORAL
  Filled 2020-08-07: qty 1

## 2020-08-07 MED ORDER — STERILE WATER FOR INJECTION IJ SOLN
INTRAMUSCULAR | Status: AC
  Filled 2020-08-07: qty 10

## 2020-08-07 MED ORDER — NICOTINE 21 MG/24HR TD PT24
21.0000 mg | MEDICATED_PATCH | Freq: Once | TRANSDERMAL | Status: AC
  Administered 2020-08-07: 21 mg via TRANSDERMAL
  Filled 2020-08-07: qty 1

## 2020-08-07 MED ORDER — LEVETIRACETAM 500 MG PO TABS
500.0000 mg | ORAL_TABLET | Freq: Two times a day (BID) | ORAL | Status: DC
  Administered 2020-08-08 – 2020-08-10 (×5): 500 mg via ORAL
  Filled 2020-08-07 (×5): qty 1

## 2020-08-07 MED ORDER — DOXYCYCLINE HYCLATE 100 MG PO TABS
100.0000 mg | ORAL_TABLET | Freq: Two times a day (BID) | ORAL | Status: DC
  Administered 2020-08-08 – 2020-08-10 (×5): 100 mg via ORAL
  Filled 2020-08-07 (×5): qty 1

## 2020-08-07 NOTE — Discharge Instructions (Signed)
  Discharge recommendations:  Patient is to take medications as prescribed. Please see information for follow-up appointment with psychiatry and therapy. Please follow up with your primary care provider for all medical related needs.   Therapy: We recommend that patient participate in individual therapy to address mental health concerns.  Medications: The parent/guardian is to contact a medical professional and/or outpatient provider to address any new side effects that develop. Parent/guardian should update outpatient providers of any new medications and/or medication changes.   Safety:  The patient should abstain from use of illicit substances/drugs and abuse of any medications. If symptoms worsen or do not continue to improve or if the patient becomes actively suicidal or homicidal then it is recommended that the patient return to the closest hospital emergency department, the [facility], or call 911 for further evaluation and treatment. National Suicide Prevention Lifeline 1-800-SUICIDE or 1-800-273-8255.  

## 2020-08-07 NOTE — ED Notes (Signed)
Pt did not meet criteria for overnight observation I Called [employer] non emergency number and dispatcher said she would have to look into getting in touch with the officers to come and pick Justin [name] back up and take him to his destination. Dispatcher said she will call me back

## 2020-08-07 NOTE — ED Notes (Signed)
Pt refused to allow staff to collect covid samples, blood specimens and UDS. Pt laying in the floor and refuses to get up. Several staff members have spoken to the Pt requesting for him to get up from the floor and allow staff to collect lab work. Pt stated, "If ya'll don't get out of here and leave me alone things are going to start hitting the walls with all that I have going on right now". Staff removed themselves from the room and shut the door at that time. Safety maintained.

## 2020-08-07 NOTE — ED Triage Notes (Signed)
Pt presents to ED BIB [employer] from [facility]. Pt presents IVC-ed by case manager from [facility]. Per [facility] pt was transferred here d/t refusing covid swab. Pt denies SI/HI, reports AH
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
## 2020-08-07 NOTE — ED Notes (Signed)
Pt returned from radiology.
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
## 2020-08-07 NOTE — ED Notes (Signed)
Pt transported to [facility] via [employer]. Agitated, paranoia continue. Safety maintained.

## 2020-08-07 NOTE — ED Notes (Signed)
Spoke with Tresa Endo, charge RN, giving update report for pt being transferred to [facility].

## 2020-08-07 NOTE — ED Notes (Signed)
Pt sleeping, calm and cooperative, restraints removed.

## 2020-08-07 NOTE — ED Provider Notes (Addendum)
Behavioral Health Admission [facility])  Date: 08/07/20 Patient Name: Justin [name] MRN: [id number] Chief Complaint:  Chief Complaint  Patient presents with  . Paranoid      Diagnoses:  Final diagnoses:  None    HPI:  Justin [name] is a 39 year old male presented to [facility] via IVC by caseworker at [facility] for paranoia. Patient presented to [facility] disorganized and complaining of auditory hallucinations and paranoid ideations of "someone" after him.   Patient presented to [facility] 0200 this a.m. voluntarily with [facility] with chief complaint "I want to check to make sure my moral compass is intact".  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 Patient contracted for safety and was discharged.   On assessment patient presents guarded and paranoid. States his brother Justin [name] was killed this past week and he needs to know if the voices in his head are real or not. "I need to know if the voices are real or not. I smoke meth and I know it affects me but I need to know what's real and what's a delusion because I'm going to defend myself. I'm not going to let anyone hurt me". Patient endorses last using methamphetamine x3 days ago, reports no sleep since. Patient states he normally take Birtarvy, Keppra. Denies any current ACTT team; states he is seen on outpatient by [facility].   Patient currently endorsing auditory hallucinations with increased paranoia. Denies any suicidal ideations; refused to respond whether he has any homicidal ideations. Does appear to be preoccupied with external/internal stimuli. Patient to be admitted to [facility] for overnight observation; reassessment in the morning by psychiatry.   1417: Nursing staff report patient verbalizing threats and refusing routine care from nursing staff during triage. On reassessment, patient found lying down on floor in corner refusing any engagement or interaction. Patient to be transferred to ED.    PHQ 2-9:   Flowsheet Row ED from 04/18/2020 in [facility] ED from 04/10/2020 in [facility] from 01/07/2020 in [facility] EMERGENCY DEPARTMENT  Thoughts that you would be better off dead, or of hurting yourself in some way More than half the days Nearly every day Nearly every day  PHQ-9 Total Score 16 22 27       Flowsheet Row ED from 07/10/2020 in [facility] ED from 07/07/2020 in [facility] from 04/18/2020 in [facility]  C-SSRS RISK CATEGORY High Risk High Risk Error: Q7 should not be populated when Q6 is No       Total Time spent with patient: 20 minutes  Musculoskeletal  Strength & Muscle Tone: within normal limits Gait & Station: normal Patient leans: N/A  Psychiatric Specialty Exam  Presentation General Appearance: Bizarre  Eye Contact:Fleeting  Speech:Pressured  Speech Volume:Normal  Handedness:Right   Mood and Affect  Mood:-- (paranoid)  Affect:Constricted; Other (comment) (paranoid)   Thought Process  Thought Processes:Disorganized (paranoid)  Descriptions of Associations:Tangential  Orientation:Partial  Thought Content:Delusions; Paranoid Ideation  Hallucinations:Hallucinations: Auditory Description of Auditory Hallucinations: "voices"  Ideas of Reference:Paranoia; Delusions; Percusatory  Suicidal Thoughts:Suicidal Thoughts: No  Homicidal Thoughts:Homicidal Thoughts: Yes, Passive HI Passive Intent and/or Plan: Without Intent; Without Plan   Sensorium  Memory:Immediate Fair; Recent Fair; Remote Fair  Judgment:Poor  Insight:Poor   Executive Functions  Concentration:Poor  Attention Span:Poor  Recall:Fair  Fund of Knowledge:Fair  Language:Fair   Psychomotor Activity  Psychomotor Activity:Psychomotor Activity: Increased  Assets  Assets:Communication Skills; Desire for  Improvement; Physical Health; Resilience   Sleep  Sleep:Sleep: Poor Number of Hours of Sleep: 0 (reports no sleep for x3 days)   Physical Exam Psychiatric:        Attention and Perception: He is inattentive. He perceives auditory hallucinations.        Mood and Affect: Mood is anxious.        Speech: Speech is tangential.        Thought Content: Thought content is paranoid and delusional.        Judgment: Judgment is impulsive and inappropriate.    Review of Systems  Psychiatric/Behavioral: Positive for substance abuse.    Blood pressure 112/79, pulse (!) 115, temperature 97.8 F (36.6 C), temperature source Oral, resp. rate 18, SpO2 99 %. There is no height or weight on file to calculate BMI.  Past Psychiatric History:    Is the patient at risk to self? pt denies Has the patient been a risk to self in the past 6 months? patient denies.    Has the patient been a risk to self within the distant past? Yes   Is the patient a risk to others? Yes   Has the patient been a risk to others in the past 6 months? Yes   Has the patient been a risk to others within the distant past? Yes   Past Medical History:  Past Medical History:  Diagnosis Date  . Endocarditis   . Hepatitis C   . History of syphilis 08/25/2014   Treated by [facility] 07-2014.  [name] HIV (human immunodeficiency virus infection) (HCC)   . Seizures (HCC)     Past Surgical History:  Procedure Laterality Date  . hand surgery Right   . LUMBAR PUNCTURE  08/08/2019      . WRIST SURGERY      Family History:  Family History  Adopted: Yes  Family history unknown: Yes    Social History:  Social History   Socioeconomic History  . Marital status: Single    Spouse name: Not on file  . Number of children: Not on file  . Years of education: Not on file  . Highest education level: Not on file  Occupational History  . Not on file  Tobacco Use  . Smoking status: Current Every Day Smoker    Types: Cigarettes  . Smokeless  tobacco: Never Used  Vaping Use  . Vaping Use: Never used  Substance and Sexual Activity  . Alcohol use: Yes    Comment: 1-2 a week   . Drug use: Yes    Frequency: 14.0 times per week    Types: Marijuana, Methamphetamines  . Sexual activity: Yes    Partners: Male    Birth control/protection: Condom    Comment: given condoms  Other Topics Concern  . Not on file  Social History Narrative   ** Merged History Encounter **       ** Merged History Encounter **       ** Merged History Encounter **       Social Determinants of Corporate investment banker Strain: Not on file  Food Insecurity: Not on file  Transportation Needs: Not on file  Physical Activity: Not on file  Stress: Not on file  Social Connections: Not on file  Intimate Partner Violence: Not on file    SDOH:  SDOH Screenings   Alcohol Screen: Not on file  Depression (PHQ2-9): Medium Risk  . PHQ-2 Score: 16  Financial Resource Strain: Not on file  Food Insecurity: Not on file  Housing: Not on file  Physical Activity: Not on file  Social Connections: Not on file  Stress: Not on file  Tobacco Use: High Risk  . Smoking Tobacco Use: Current Every Day Smoker  . Smokeless Tobacco Use: Never Used  Transportation Needs: Not on file    Last Labs:  Admission on 07/10/2020, Discharged on 07/10/2020  Component Date Value Ref Range Status  . SARS Coronavirus 2 by RT PCR 07/10/2020 NEGATIVE  NEGATIVE Final   Comment: (NOTE) SARS-CoV-2 target nucleic acids are NOT DETECTED.  The SARS-CoV-2 RNA is generally detectable in upper respiratory specimens during the acute phase of infection. The lowest concentration of SARS-CoV-2 viral copies this assay can detect is 138 copies/mL. A negative result does not preclude SARS-Cov-2 infection and should not be used as the sole basis for treatment or other patient management decisions. A negative result may occur with  improper specimen collection/handling, submission of  specimen other than nasopharyngeal swab, presence of viral mutation(s) within the areas targeted by this assay, and inadequate number of viral copies(<138 copies/mL). A negative result must be combined with clinical observations, patient history, and epidemiological information. The expected result is Negative.  Fact Sheet for Patients:  BloggerCourse.comhttps://www.fda.gov/media/152166/download  Fact Sheet for Healthcare Providers:  SeriousBroker.ithttps://www.fda.gov/media/152162/download  This test is no                          t yet approved or cleared by the [place] FDA and  has been authorized for detection and/or diagnosis of SARS-CoV-2 by FDA under an Emergency Use Authorization (EUA). This EUA will remain  in effect (meaning this test can be used) for the duration of the COVID-19 declaration under Section 564(b)(1) of the Act, 21 U.S.C.section 360bbb-3(b)(1), unless the authorization is terminated  or revoked sooner.      . Influenza A by PCR 07/10/2020 NEGATIVE  NEGATIVE Final  . Influenza B by PCR 07/10/2020 NEGATIVE  NEGATIVE Final   Comment: (NOTE) The Xpert Xpress SARS-CoV-2/FLU/RSV plus assay is intended as an aid in the diagnosis of influenza from Nasopharyngeal swab specimens and should not be used as a sole basis for treatment. Nasal washings and aspirates are unacceptable for Xpert Xpress SARS-CoV-2/FLU/RSV testing.  Fact Sheet for Patients: BloggerCourse.comhttps://www.fda.gov/media/152166/download  Fact Sheet for Healthcare Providers: SeriousBroker.ithttps://www.fda.gov/media/152162/download  This test is not yet approved or cleared by the [place] FDA and has been authorized for detection and/or diagnosis of SARS-CoV-2 by FDA under an Emergency Use Authorization (EUA). This EUA will remain in effect (meaning this test can be used) for the duration of the COVID-19 declaration under Section 564(b)(1) of the Act, 21 U.S.C. section 360bbb-3(b)(1), unless the authorization is terminated  or revoked.  Performed at [facility], [address] [place], [place] [id number]   . SARS Coronavirus 2 Ag 07/10/2020 Negative  Negative Preliminary  . WBC 07/10/2020 7.5  4.0 - 10.5 K/uL Final  . RBC 07/10/2020 4.29  4.22 - 5.81 MIL/uL Final  . Hemoglobin 07/10/2020 13.8  13.0 - 17.0 g/dL Final  . HCT 29/56/213001/01/2021 41.1  39.0 - 52.0 % Final  . MCV 07/10/2020 95.8  80.0 - 100.0 fL Final  . MCH 07/10/2020 32.2  26.0 - 34.0 pg Final  . MCHC 07/10/2020 33.6  30.0 - 36.0 g/dL Final  . RDW 86/57/846901/01/2021 12.0  11.5 - 15.5 % Final  . Platelets 07/10/2020 301  150 - 400 K/uL Final  . nRBC 07/10/2020 0.0  0.0 - 0.2 % Final  . Neutrophils Relative % 07/10/2020 69  % Final  . Neutro Abs 07/10/2020 5.1  1.7 - 7.7 K/uL Final  . Lymphocytes Relative 07/10/2020 21  % Final  . Lymphs Abs 07/10/2020 1.6  0.7 - 4.0 K/uL Final  . Monocytes Relative 07/10/2020 9  % Final  . Monocytes Absolute 07/10/2020 0.7  0.1 - 1.0 K/uL Final  . Eosinophils Relative 07/10/2020 0  % Final  . Eosinophils Absolute 07/10/2020 0.0  0.0 - 0.5 K/uL Final  . Basophils Relative 07/10/2020 0  % Final  . Basophils Absolute 07/10/2020 0.0  0.0 - 0.1 K/uL Final  . Immature Granulocytes 07/10/2020 1  % Final  . Abs Immature Granulocytes 07/10/2020 0.04  0.00 - 0.07 K/uL Final   Performed at [facility], [address], [place], [place] [address]  . Sodium 07/10/2020 139  135 - 145 mmol/L Final  . Potassium 07/10/2020 4.1  3.5 - 5.1 mmol/L Final  . Chloride 07/10/2020 100  98 - 111 mmol/L Final  . CO2 07/10/2020 24  22 - 32 mmol/L Final  . Glucose, Bld 07/10/2020 100* 70 - 99 mg/dL Final   Glucose reference range applies only to samples taken after fasting for at least 8 hours.  . BUN 07/10/2020 15  6 - 20 mg/dL Final  . Creatinine, Ser 07/10/2020 1.44* 0.61 - 1.24 mg/dL Final  . Calcium 60/45/4098 9.4  8.9 - 10.3 mg/dL Final  . Total Protein 07/10/2020 7.1  6.5 - 8.1 g/dL Final  . Albumin 11/91/4782 3.9  3.5 - 5.0  g/dL Final  . AST 95/62/1308 26  15 - 41 U/L Final  . ALT 07/10/2020 23  0 - 44 U/L Final  . Alkaline Phosphatase 07/10/2020 70  38 - 126 U/L Final  . Total Bilirubin 07/10/2020 1.0  0.3 - 1.2 mg/dL Final  . GFR, Estimated 07/10/2020 >60  >60 mL/min Final   Comment: (NOTE) Calculated using the CKD-EPI Creatinine Equation (2021)   . Anion gap 07/10/2020 15  5 - 15 Final   Performed at [facility], [address], [place], [place] [address]  . Acetaminophen (Tylenol), Serum 07/10/2020 16  10 - 30 ug/mL Final   Comment: (NOTE) Therapeutic concentrations vary significantly. A range of 10-30 ug/mL  may be an effective concentration for many patients. However, some  are best treated at concentrations outside of this range. Acetaminophen concentrations >150 ug/mL at 4 hours after ingestion  and >50 ug/mL at 12 hours after ingestion are often associated with  toxic reactions.  Performed at [facility], [address], [place], [place] [address]   . Salicylate Lvl 07/10/2020 <7.0* 7.0 - 30.0 mg/dL Final   Performed at [facility], [address], [place], [place] [address]  . POC Amphetamine UR 07/10/2020 None Detected  NONE DETECTED (Cut Off Level 1000 ng/mL) Final  . POC Secobarbital (BAR) 07/10/2020 None Detected  NONE DETECTED (Cut Off Level 300 ng/mL) Final  . POC Buprenorphine (BUP) 07/10/2020 None Detected  NONE DETECTED (Cut Off Level 10 ng/mL) Final  . POC Oxazepam (BZO) 07/10/2020 None Detected  NONE DETECTED (Cut Off Level 300 ng/mL) Final  . POC Cocaine UR 07/10/2020 None Detected  NONE DETECTED (Cut Off Level 300 ng/mL) Final  . POC Methamphetamine UR 07/10/2020 None Detected  NONE DETECTED (Cut Off Level 1000 ng/mL) Final  . POC  Morphine 07/10/2020 Positive* NONE DETECTED (Cut Off Level 300 ng/mL) Final  . POC Oxycodone UR 07/10/2020 None Detected  NONE DETECTED (Cut Off Level 100 ng/mL) Final  . POC Methadone UR 07/10/2020 None Detected  NONE DETECTED (Cut  Off Level 300 ng/mL) Final  . POC Marijuana UR 07/10/2020 None Detected  NONE DETECTED (Cut Off Level 50 ng/mL) Final  Admission on 06/21/2020, Discharged on 06/21/2020  Component Date Value Ref Range Status  . Sodium 06/21/2020 139  135 - 145 mmol/L Final  . Potassium 06/21/2020 3.6  3.5 - 5.1 mmol/L Final  . Chloride 06/21/2020 106  98 - 111 mmol/L Final  . CO2 06/21/2020 24  22 - 32 mmol/L Final  . Glucose, Bld 06/21/2020 109* 70 - 99 mg/dL Final   Glucose reference range applies only to samples taken after fasting for at least 8 hours.  . BUN 06/21/2020 12  6 - 20 mg/dL Final  . Creatinine, Ser 06/21/2020 1.03  0.61 - 1.24 mg/dL Final  . Calcium 81/19/1478 9.6  8.9 - 10.3 mg/dL Final  . Total Protein 06/21/2020 7.7  6.5 - 8.1 g/dL Final  . Albumin 29/56/2130 4.2  3.5 - 5.0 g/dL Final  . AST 86/57/8469 89* 15 - 41 U/L Final  . ALT 06/21/2020 45* 0 - 44 U/L Final  . Alkaline Phosphatase 06/21/2020 80  38 - 126 U/L Final  . Total Bilirubin 06/21/2020 1.6* 0.3 - 1.2 mg/dL Final  . GFR, Estimated 06/21/2020 >60  >60 mL/min Final   Comment: (NOTE) Calculated using the CKD-EPI Creatinine Equation (2021)   . Anion gap 06/21/2020 9  5 - 15 Final   Performed at [facility], [address] [place], [place] [address]  Admission on 04/18/2020, Discharged on 04/19/2020  Component Date Value Ref Range Status  . SARS Coronavirus 2 by RT PCR 04/18/2020 NEGATIVE  NEGATIVE Final   Comment: (NOTE) SARS-CoV-2 target nucleic acids are NOT DETECTED.  The SARS-CoV-2 RNA is generally detectable in upper respiratoy specimens during the acute phase of infection. The lowest concentration of SARS-CoV-2 viral copies this assay can detect is 131 copies/mL. A negative result does not preclude SARS-Cov-2 infection and should not be used as the sole basis for treatment or other patient management decisions. A negative result may occur with  improper specimen collection/handling, submission of  specimen other than nasopharyngeal swab, presence of viral mutation(s) within the areas targeted by this assay, and inadequate number of viral copies (<131 copies/mL). A negative result must be combined with clinical observations, patient history, and epidemiological information. The expected result is Negative.  Fact Sheet for Patients:  https://www.moore.com/  Fact Sheet for Healthcare Providers:  https://www.young.biz/  This test is no                          t yet approved or cleared by the [place] FDA and  has been authorized for detection and/or diagnosis of SARS-CoV-2 by FDA under an Emergency Use Authorization (EUA). This EUA will remain  in effect (meaning this test can be used) for the duration of the COVID-19 declaration under Section 564(b)(1) of the Act, 21 U.S.C. section 360bbb-3(b)(1), unless the authorization is terminated or revoked sooner.    . Influenza A by PCR 04/18/2020 NEGATIVE  NEGATIVE Final  . Influenza B by PCR 04/18/2020 NEGATIVE  NEGATIVE Final   Comment: (NOTE) The Xpert Xpress SARS-CoV-2/FLU/RSV assay is intended as an aid in  the diagnosis of  influenza from Nasopharyngeal swab specimens and  should not be used as a sole basis for treatment. Nasal washings and  aspirates are unacceptable for Xpert Xpress SARS-CoV-2/FLU/RSV  testing.  Fact Sheet for Patients: https://www.moore.com/  Fact Sheet for Healthcare Providers: https://www.young.biz/  This test is not yet approved or cleared by the [place] FDA and  has been authorized for detection and/or diagnosis of SARS-CoV-2 by  FDA under an Emergency Use Authorization (EUA). This EUA will remain  in effect (meaning this test can be used) for the duration of the  Covid-19 declaration under Section 564(b)(1) of the Act, 21  U.S.C. section 360bbb-3(b)(1), unless the authorization is  terminated or  revoked. Performed at [facility], [address] [place], [place] [address]   . WBC 04/18/2020 7.4  4.0 - 10.5 K/uL Final  . RBC 04/18/2020 4.97  4.22 - 5.81 MIL/uL Final  . Hemoglobin 04/18/2020 15.7  13.0 - 17.0 g/dL Final  . HCT 59/56/3875 47.8  39.0 - 52.0 % Final  . MCV 04/18/2020 96.2  80.0 - 100.0 fL Final  . MCH 04/18/2020 31.6  26.0 - 34.0 pg Final  . MCHC 04/18/2020 32.8  30.0 - 36.0 g/dL Final  . RDW 64/33/2951 11.9  11.5 - 15.5 % Final  . Platelets 04/18/2020 296  150 - 400 K/uL Final  . nRBC 04/18/2020 0.0  0.0 - 0.2 % Final  . Neutrophils Relative % 04/18/2020 62  % Final  . Neutro Abs 04/18/2020 4.5  1.7 - 7.7 K/uL Final  . Lymphocytes Relative 04/18/2020 29  % Final  . Lymphs Abs 04/18/2020 2.1  0.7 - 4.0 K/uL Final  . Monocytes Relative 04/18/2020 9  % Final  . Monocytes Absolute 04/18/2020 0.7  0.1 - 1.0 K/uL Final  . Eosinophils Relative 04/18/2020 0  % Final  . Eosinophils Absolute 04/18/2020 0.0  0.0 - 0.5 K/uL Final  . Basophils Relative 04/18/2020 0  % Final  . Basophils Absolute 04/18/2020 0.0  0.0 - 0.1 K/uL Final  . Immature Granulocytes 04/18/2020 0  % Final  . Abs Immature Granulocytes 04/18/2020 0.02  0.00 - 0.07 K/uL Final   Performed at [facility], [address], [place], [place] [address]  . Sodium 04/18/2020 137  135 - 145 mmol/L Final  . Potassium 04/18/2020 3.3* 3.5 - 5.1 mmol/L Final  . Chloride 04/18/2020 99  98 - 111 mmol/L Final  . CO2 04/18/2020 25  22 - 32 mmol/L Final  . Glucose, Bld 04/18/2020 103* 70 - 99 mg/dL Final   Glucose reference range applies only to samples taken after fasting for at least 8 hours.  . BUN 04/18/2020 12  6 - 20 mg/dL Final  . Creatinine, Ser 04/18/2020 1.18  0.61 - 1.24 mg/dL Final  . Calcium 60/63/0160 9.7  8.9 - 10.3 mg/dL Final  . Total Protein 04/18/2020 8.0  6.5 - 8.1 g/dL Final  . Albumin 10/93/2355 4.5  3.5 - 5.0 g/dL Final  . AST 73/22/0254 42* 15 - 41 U/L Final  . ALT 04/18/2020 48*  0 - 44 U/L Final  . Alkaline Phosphatase 04/18/2020 99  38 - 126 U/L Final  . Total Bilirubin 04/18/2020 2.6* 0.3 - 1.2 mg/dL Final  . GFR, Estimated 04/18/2020 >60  >60 mL/min Final  . Anion gap 04/18/2020 13  5 - 15 Final   Performed at [facility], [address], [place], [place] [address]  . Hgb A1c MFr Bld 04/18/2020  4.7* 4.8 - 5.6 % Final   Comment: (NOTE)         Prediabetes: 5.7 - 6.4         Diabetes: >6.4         Glycemic control for adults with diabetes: <7.0   . Mean Plasma Glucose 04/18/2020 88  mg/dL Final   Comment: (NOTE) Performed At: [facility] [address] [place], [place] [id number] Jolene Schimke MD EH:[id number]   . Alcohol, Ethyl (B) 04/18/2020 <10  <10 mg/dL Final   Comment: (NOTE) Lowest detectable limit for serum alcohol is 10 mg/dL.  For medical purposes only. Performed at [facility], [address] [place], [place] [address]   . POC Amphetamine UR 04/18/2020 Positive* None Detected Final  . POC Secobarbital (BAR) 04/18/2020 None Detected  None Detected Final  . POC Buprenorphine (BUP) 04/18/2020 None Detected  None Detected Final  . POC Oxazepam (BZO) 04/18/2020 None Detected  None Detected Final  . POC Cocaine UR 04/18/2020 None Detected  None Detected Final  . POC Methamphetamine UR 04/18/2020 Positive* None Detected Final  . POC Morphine 04/18/2020 None Detected  None Detected Final  . POC Oxycodone UR 04/18/2020 None Detected  None Detected Final  . POC Methadone UR 04/18/2020 None Detected  None Detected Final  . POC Marijuana UR 04/18/2020 None Detected  None Detected Final  . SARS Coronavirus 2 Ag 04/18/2020 NEGATIVE  NEGATIVE Final   Comment: (NOTE) SARS-CoV-2 antigen NOT DETECTED.   Negative results are presumptive.  Negative results do not preclude SARS-CoV-2 infection and should not be used as the sole basis for treatment or other patient management decisions, including infection  control decisions,  particularly in the presence of clinical signs and  symptoms consistent with COVID-19, or in those who have been in contact with the virus.  Negative results must be combined with clinical observations, patient history, and epidemiological information. The expected result is Negative.  Fact Sheet for Patients: https://sanders-williams.net/  Fact Sheet for Healthcare Providers: https://martinez.com/   This test is not yet approved or cleared by the [place] FDA and  has been authorized for detection and/or diagnosis of SARS-CoV-2 by FDA under an Emergency Use Authorization (EUA).  This EUA will remain in effect (meaning this test can be used) for the duration of  the C                          OVID-19 declaration under Section 564(b)(1) of the Act, 21 U.S.C. section 360bbb-3(b)(1), unless the authorization is terminated or revoked sooner.    . Cholesterol 04/18/2020 164  0 - 200 mg/dL Final  . Triglycerides 04/18/2020 39  <150 mg/dL Final  . HDL 50/27/7412 58  >40 mg/dL Final  . Total CHOL/HDL Ratio 04/18/2020 2.8  RATIO Final  . VLDL 04/18/2020 8  0 - 40 mg/dL Final  . LDL Cholesterol 04/18/2020 98  0 - 99 mg/dL Final   Comment:        Total Cholesterol/HDL:CHD Risk Coronary Heart Disease Risk Table                     Men   Women  1/2 Average Risk   3.4   3.3  Average Risk       5.0   4.4  2 X Average Risk   9.6   7.1  3 X Average Risk  23.4   11.0  Use the calculated Patient Ratio above and the CHD Risk Table to determine the patient's CHD Risk.        ATP III CLASSIFICATION (LDL):  <100     mg/dL   Optimal  161-096  mg/dL   Near or Above                    Optimal  130-159  mg/dL   Borderline  045-409  mg/dL   High  >811     mg/dL   Very High Performed at [facility], [address], [place], [place] [address]   Admission on 04/10/2020, Discharged on 04/12/2020  Component Date Value Ref Range Status  . SARS  Coronavirus 2 by RT PCR 04/10/2020 NEGATIVE  NEGATIVE Final   Comment: (NOTE) SARS-CoV-2 target nucleic acids are NOT DETECTED.  The SARS-CoV-2 RNA is generally detectable in upper respiratoy specimens during the acute phase of infection. The lowest concentration of SARS-CoV-2 viral copies this assay can detect is 131 copies/mL. A negative result does not preclude SARS-Cov-2 infection and should not be used as the sole basis for treatment or other patient management decisions. A negative result may occur with  improper specimen collection/handling, submission of specimen other than nasopharyngeal swab, presence of viral mutation(s) within the areas targeted by this assay, and inadequate number of viral copies (<131 copies/mL). A negative result must be combined with clinical observations, patient history, and epidemiological information. The expected result is Negative.  Fact Sheet for Patients:  https://www.moore.com/  Fact Sheet for Healthcare Providers:  https://www.young.biz/  This test is no                          t yet approved or cleared by the [place] FDA and  has been authorized for detection and/or diagnosis of SARS-CoV-2 by FDA under an Emergency Use Authorization (EUA). This EUA will remain  in effect (meaning this test can be used) for the duration of the COVID-19 declaration under Section 564(b)(1) of the Act, 21 U.S.C. section 360bbb-3(b)(1), unless the authorization is terminated or revoked sooner.    . Influenza A by PCR 04/10/2020 NEGATIVE  NEGATIVE Final  . Influenza B by PCR 04/10/2020 NEGATIVE  NEGATIVE Final   Comment: (NOTE) The Xpert Xpress SARS-CoV-2/FLU/RSV assay is intended as an aid in  the diagnosis of influenza from Nasopharyngeal swab specimens and  should not be used as a sole basis for treatment. Nasal washings and  aspirates are unacceptable for Xpert Xpress SARS-CoV-2/FLU/RSV  testing.  Fact  Sheet for Patients: https://www.moore.com/  Fact Sheet for Healthcare Providers: https://www.young.biz/  This test is not yet approved or cleared by the [place] FDA and  has been authorized for detection and/or diagnosis of SARS-CoV-2 by  FDA under an Emergency Use Authorization (EUA). This EUA will remain  in effect (meaning this test can be used) for the duration of the  Covid-19 declaration under Section 564(b)(1) of the Act, 21  U.S.C. section 360bbb-3(b)(1), unless the authorization is  terminated or revoked. Performed at [facility], [address] [place], [place] [address]   . Sodium 04/10/2020 141  135 - 145 mmol/L Final  . Potassium 04/10/2020 3.6  3.5 - 5.1 mmol/L Final  . Chloride 04/10/2020 103  98 - 111 mmol/L Final  . CO2 04/10/2020 26  22 - 32 mmol/L Final  . Glucose, Bld 04/10/2020 96  70 - 99 mg/dL Final  Glucose reference range applies only to samples taken after fasting for at least 8 hours.  . BUN 04/10/2020 16  6 - 20 mg/dL Final  . Creatinine, Ser 04/10/2020 1.18  0.61 - 1.24 mg/dL Final  . Calcium 89/37/3428 9.6  8.9 - 10.3 mg/dL Final  . Total Protein 04/10/2020 8.1  6.5 - 8.1 g/dL Final  . Albumin 76/81/1572 4.8  3.5 - 5.0 g/dL Final  . AST 62/09/5595 30  15 - 41 U/L Final  . ALT 04/10/2020 29  0 - 44 U/L Final  . Alkaline Phosphatase 04/10/2020 85  38 - 126 U/L Final  . Total Bilirubin 04/10/2020 2.1* 0.3 - 1.2 mg/dL Final  . GFR, Estimated 04/10/2020 >60  >60 mL/min Final  . Anion gap 04/10/2020 12  5 - 15 Final   Performed at [facility], [address], [place], [place] [address]  . Alcohol, Ethyl (B) 04/10/2020 <10  <10 mg/dL Final   Comment: (NOTE) Lowest detectable limit for serum alcohol is 10 mg/dL.  For medical purposes only. Performed at [facility] [address], [place], [place] [address]   . Opiates 04/11/2020 NONE DETECTED  NONE  DETECTED Final  . Cocaine 04/11/2020 NONE DETECTED  NONE DETECTED Final  . Benzodiazepines 04/11/2020 NONE DETECTED  NONE DETECTED Final  . Amphetamines 04/11/2020 POSITIVE* NONE DETECTED Final  . Tetrahydrocannabinol 04/11/2020 NONE DETECTED  NONE DETECTED Final  . Barbiturates 04/11/2020 NONE DETECTED  NONE DETECTED Final   Comment: (NOTE) DRUG SCREEN FOR MEDICAL PURPOSES ONLY.  IF CONFIRMATION IS NEEDED FOR ANY PURPOSE, NOTIFY LAB WITHIN 5 DAYS.  LOWEST DETECTABLE LIMITS FOR URINE DRUG SCREEN Drug Class                     Cutoff (ng/mL) Amphetamine and metabolites    1000 Barbiturate and metabolites    200 Benzodiazepine                 200 Tricyclics and metabolites     300 Opiates and metabolites        300 Cocaine and metabolites        300 THC                            50 Performed at [facility], [address], [place], [place] [address]   . WBC 04/10/2020 10.1  4.0 - 10.5 K/uL Final  . RBC 04/10/2020 4.68  4.22 - 5.81 MIL/uL Final  . Hemoglobin 04/10/2020 15.0  13.0 - 17.0 g/dL Final  . HCT 06/26/8249 45.1  39.0 - 52.0 % Final  . MCV 04/10/2020 96.4  80.0 - 100.0 fL Final  . MCH 04/10/2020 32.1  26.0 - 34.0 pg Final  . MCHC 04/10/2020 33.3  30.0 - 36.0 g/dL Final  . RDW 03/70/4888 12.3  11.5 - 15.5 % Final  . Platelets 04/10/2020 243  150 - 400 K/uL Final  . nRBC 04/10/2020 0.0  0.0 - 0.2 % Final  . Neutrophils Relative % 04/10/2020 67  % Final  . Neutro Abs 04/10/2020 6.7  1.7 - 7.7 K/uL Final  . Lymphocytes Relative 04/10/2020 20  % Final  . Lymphs Abs 04/10/2020 2.1  0.7 - 4.0 K/uL Final  . Monocytes Relative 04/10/2020 13  % Final  . Monocytes Absolute 04/10/2020 1.3* 0.1 - 1.0 K/uL Final  . Eosinophils Relative 04/10/2020 0  % Final  . Eosinophils  Absolute 04/10/2020 0.0  0.0 - 0.5 K/uL Final  . Basophils Relative 04/10/2020 0  % Final  . Basophils Absolute 04/10/2020 0.0  0.0 - 0.1 K/uL Final  . Immature Granulocytes 04/10/2020 0  %  Final  . Abs Immature Granulocytes 04/10/2020 0.04  0.00 - 0.07 K/uL Final   Performed at [facility], [address] [place], [place] [address]  Admission on 03/26/2020, Discharged on 03/27/2020  Component Date Value Ref Range Status  . WBC 03/26/2020 7.1  4.0 - 10.5 K/uL Final  . RBC 03/26/2020 5.23  4.22 - 5.81 MIL/uL Final  . Hemoglobin 03/26/2020 17.1* 13.0 - 17.0 g/dL Final  . HCT 60/45/4098 50.8  39.0 - 52.0 % Final  . MCV 03/26/2020 97.1  80.0 - 100.0 fL Final  . MCH 03/26/2020 32.7  26.0 - 34.0 pg Final  . MCHC 03/26/2020 33.7  30.0 - 36.0 g/dL Final  . RDW 11/91/4782 12.2  11.5 - 15.5 % Final  . Platelets 03/26/2020 233  150 - 400 K/uL Final  . nRBC 03/26/2020 0.0  0.0 - 0.2 % Final  . Neutrophils Relative % 03/26/2020 50  % Final  . Neutro Abs 03/26/2020 3.5  1.7 - 7.7 K/uL Final  . Lymphocytes Relative 03/26/2020 37  % Final  . Lymphs Abs 03/26/2020 2.6  0.7 - 4.0 K/uL Final  . Monocytes Relative 03/26/2020 11  % Final  . Monocytes Absolute 03/26/2020 0.8  0.1 - 1.0 K/uL Final  . Eosinophils Relative 03/26/2020 1  % Final  . Eosinophils Absolute 03/26/2020 0.1  0.0 - 0.5 K/uL Final  . Basophils Relative 03/26/2020 1  % Final  . Basophils Absolute 03/26/2020 0.0  0.0 - 0.1 K/uL Final  . Immature Granulocytes 03/26/2020 0  % Final  . Abs Immature Granulocytes 03/26/2020 0.02  0.00 - 0.07 K/uL Final   Performed at [facility], [address] [place], [place] [address]  . Sodium 03/26/2020 142  135 - 145 mmol/L Final  . Potassium 03/26/2020 3.8  3.5 - 5.1 mmol/L Final  . Chloride 03/26/2020 102  98 - 111 mmol/L Final  . CO2 03/26/2020 25  22 - 32 mmol/L Final  . Glucose, Bld 03/26/2020 96  70 - 99 mg/dL Final   Glucose reference range applies only to samples taken after fasting for at least 8 hours.  . BUN 03/26/2020 13  6 - 20 mg/dL Final  . Creatinine, Ser 03/26/2020 1.15  0.61 - 1.24 mg/dL Final  . Calcium 30/86/5784 10.2  8.9 -  10.3 mg/dL Final  . Total Protein 03/26/2020 8.7* 6.5 - 8.1 g/dL Final  . Albumin 69/62/9528 4.9  3.5 - 5.0 g/dL Final  . AST 41/32/4401 37  15 - 41 U/L Final  . ALT 03/26/2020 36  0 - 44 U/L Final  . Alkaline Phosphatase 03/26/2020 86  38 - 126 U/L Final  . Total Bilirubin 03/26/2020 1.8* 0.3 - 1.2 mg/dL Final  . GFR calc non Af Amer 03/26/2020 >60  >60 mL/min Final  . GFR calc Af Amer 03/26/2020 >60  >60 mL/min Final  . Anion gap 03/26/2020 15  5 - 15 Final   Performed at [facility], [address], [place], [place] [address]  . SARS Coronavirus 2 by RT PCR 03/26/2020 NEGATIVE  NEGATIVE Final   Comment: (NOTE) SARS-CoV-2 target nucleic acids are NOT DETECTED.  The SARS-CoV-2 RNA is generally detectable in upper respiratoy specimens during the acute phase of  infection. The lowest concentration of SARS-CoV-2 viral copies this assay can detect is 131 copies/mL. A negative result does not preclude SARS-Cov-2 infection and should not be used as the sole basis for treatment or other patient management decisions. A negative result may occur with  improper specimen collection/handling, submission of specimen other than nasopharyngeal swab, presence of viral mutation(s) within the areas targeted by this assay, and inadequate number of viral copies (<131 copies/mL). A negative result must be combined with clinical observations, patient history, and epidemiological information. The expected result is Negative.  Fact Sheet for Patients:  https://www.moore.com/  Fact Sheet for Healthcare Providers:  https://www.young.biz/  This test is no                          t yet approved or cleared by the [place] FDA and  has been authorized for detection and/or diagnosis of SARS-CoV-2 by FDA under an Emergency Use Authorization (EUA). This EUA will remain  in effect (meaning this test can be used) for the duration of the COVID-19  declaration under Section 564(b)(1) of the Act, 21 U.S.C. section 360bbb-3(b)(1), unless the authorization is terminated or revoked sooner.    . Influenza A by PCR 03/26/2020 NEGATIVE  NEGATIVE Final  . Influenza B by PCR 03/26/2020 NEGATIVE  NEGATIVE Final   Comment: (NOTE) The Xpert Xpress SARS-CoV-2/FLU/RSV assay is intended as an aid in  the diagnosis of influenza from Nasopharyngeal swab specimens and  should not be used as a sole basis for treatment. Nasal washings and  aspirates are unacceptable for Xpert Xpress SARS-CoV-2/FLU/RSV  testing.  Fact Sheet for Patients: https://www.moore.com/  Fact Sheet for Healthcare Providers: https://www.young.biz/  This test is not yet approved or cleared by the [place] FDA and  has been authorized for detection and/or diagnosis of SARS-CoV-2 by  FDA under an Emergency Use Authorization (EUA). This EUA will remain  in effect (meaning this test can be used) for the duration of the  Covid-19 declaration under Section 564(b)(1) of the Act, 21  U.S.C. section 360bbb-3(b)(1), unless the authorization is  terminated or revoked.   [name] Respiratory Syncytial Virus by PCR 03/26/2020 NEGATIVE  NEGATIVE Final   Comment: (NOTE) Fact Sheet for Patients: https://www.moore.com/  Fact Sheet for Healthcare Providers: https://www.young.biz/  This test is not yet approved or cleared by the [place] FDA and  has been authorized for detection and/or diagnosis of SARS-CoV-2 by  FDA under an Emergency Use Authorization (EUA). This EUA will remain  in effect (meaning this test can be used) for the duration of the  COVID-19 declaration under Section 564(b)(1) of the Act, 21 U.S.C.  section 360bbb-3(b)(1), unless the authorization is terminated or  revoked. Performed at [facility], [address] [place], [place] [address]   . Opiates 03/26/2020 NONE  DETECTED  NONE DETECTED Final  . Cocaine 03/26/2020 NONE DETECTED  NONE DETECTED Final  . Benzodiazepines 03/26/2020 POSITIVE* NONE DETECTED Final  . Amphetamines 03/26/2020 POSITIVE* NONE DETECTED Final  . Tetrahydrocannabinol 03/26/2020 POSITIVE* NONE DETECTED Final  . Barbiturates 03/26/2020 NONE DETECTED  NONE DETECTED Final   Comment: (NOTE) DRUG SCREEN FOR MEDICAL PURPOSES ONLY.  IF CONFIRMATION IS NEEDED FOR ANY PURPOSE, NOTIFY LAB WITHIN 5 DAYS.  LOWEST DETECTABLE LIMITS FOR URINE DRUG SCREEN Drug Class                     Cutoff (ng/mL) Amphetamine and metabolites  1000 Barbiturate and metabolites    200 Benzodiazepine                 200 Tricyclics and metabolites     300 Opiates and metabolites        300 Cocaine and metabolites        300 THC                            50 Performed at [facility], [address], [place], [place] [address]   . Alcohol, Ethyl (B) 03/26/2020 <10  <10 mg/dL Final   Comment: (NOTE) Lowest detectable limit for serum alcohol is 10 mg/dL.  For medical purposes only. Performed at [facility], [address] [place], [place] [address]   . Salicylate Lvl 03/26/2020 <7.0* 7.0 - 30.0 mg/dL Final   Performed at [facility], [address], [place], [place] [address]  . Acetaminophen (Tylenol), Serum 03/26/2020 <10* 10 - 30 ug/mL Final   Comment: (NOTE) Therapeutic concentrations vary significantly. A range of 10-30 ug/mL  may be an effective concentration for many patients. However, some  are best treated at concentrations outside of this range. Acetaminophen concentrations >150 ug/mL at 4 hours after ingestion  and >50 ug/mL at 12 hours after ingestion are often associated with  toxic reactions.  Performed at [facility], [address] [place], [place] [address]   Admission on 03/22/2020, Discharged on 03/23/2020  Component Date Value Ref Range Status   . SARS Coronavirus 2 03/22/2020 NEGATIVE  NEGATIVE Final   Comment: (NOTE) SARS-CoV-2 target nucleic acids are NOT DETECTED.  The SARS-CoV-2 RNA is generally detectable in upper and lower respiratory specimens during the acute phase of infection. The lowest concentration of SARS-CoV-2 viral copies this assay can detect is 250 copies / mL. A negative result does not preclude SARS-CoV-2 infection and should not be used as the sole basis for treatment or other patient management decisions.  A negative result may occur with improper specimen collection / handling, submission of specimen other than nasopharyngeal swab, presence of viral mutation(s) within the areas targeted by this assay, and inadequate number of viral copies (<250 copies / mL). A negative result must be combined with clinical observations, patient history, and epidemiological information.  Fact Sheet for Patients:   BoilerBrush.com.cy  Fact Sheet for Healthcare Providers: https://pope.com/  This test is not yet approved or                           cleared by the [place] FDA and has been authorized for detection and/or diagnosis of SARS-CoV-2 by FDA under an Emergency Use Authorization (EUA).  This EUA will remain in effect (meaning this test can be used) for the duration of the COVID-19 declaration under Section 564(b)(1) of the Act, 21 U.S.C. section 360bbb-3(b)(1), unless the authorization is terminated or revoked sooner.  Performed at [facility], [address] [place], [place] [address]   . Sodium 03/22/2020 137  135 - 145 mmol/L Final  . Potassium 03/22/2020 3.2* 3.5 - 5.1 mmol/L Final  . Chloride 03/22/2020 101  98 - 111 mmol/L Final  . CO2 03/22/2020 21* 22 - 32 mmol/L Final  . Glucose, Bld 03/22/2020 144* 70 - 99 mg/dL Final   Glucose reference range applies only to samples taken after fasting for at least 8 hours.  [name]  BUN  03/22/2020 10  6 - 20 mg/dL Final  . Creatinine, Ser 03/22/2020 1.03  0.61 - 1.24 mg/dL Final  . Calcium 33/29/5188 9.7  8.9 - 10.3 mg/dL Final  . Total Protein 03/22/2020 8.1  6.5 - 8.1 g/dL Final  . Albumin 41/66/0630 5.0  3.5 - 5.0 g/dL Final  . AST 16/07/930 70* 15 - 41 U/L Final  . ALT 03/22/2020 49* 0 - 44 U/L Final  . Alkaline Phosphatase 03/22/2020 78  38 - 126 U/L Final  . Total Bilirubin 03/22/2020 3.8* 0.3 - 1.2 mg/dL Final  . GFR calc non Af Amer 03/22/2020 >60  >60 mL/min Final  . GFR calc Af Amer 03/22/2020 >60  >60 mL/min Final  . Anion gap 03/22/2020 15  5 - 15 Final   Performed at [facility], [address] [place], [place] [address]  . Alcohol, Ethyl (B) 03/22/2020 <10  <10 mg/dL Final   Comment: (NOTE) Lowest detectable limit for serum alcohol is 10 mg/dL.  For medical purposes only. Performed at [facility], [address] [place], [place] [address]   . Opiates 03/22/2020 NONE DETECTED  NONE DETECTED Final  . Cocaine 03/22/2020 NONE DETECTED  NONE DETECTED Final  . Benzodiazepines 03/22/2020 POSITIVE* NONE DETECTED Final  . Amphetamines 03/22/2020 POSITIVE* NONE DETECTED Final  . Tetrahydrocannabinol 03/22/2020 NONE DETECTED  NONE DETECTED Final  . Barbiturates 03/22/2020 NONE DETECTED  NONE DETECTED Final   Comment: (NOTE) DRUG SCREEN FOR MEDICAL PURPOSES ONLY.  IF CONFIRMATION IS NEEDED FOR ANY PURPOSE, NOTIFY LAB WITHIN 5 DAYS.  LOWEST DETECTABLE LIMITS FOR URINE DRUG SCREEN Drug Class                     Cutoff (ng/mL) Amphetamine and metabolites    1000 Barbiturate and metabolites    200 Benzodiazepine                 200 Tricyclics and metabolites     300 Opiates and metabolites        300 Cocaine and metabolites        300 THC                            50 Performed at [facility], [address], [place], [place] [address]   . WBC 03/22/2020 7.3  4.0 - 10.5 K/uL Final  . RBC  03/22/2020 4.98  4.22 - 5.81 MIL/uL Final  . Hemoglobin 03/22/2020 16.2  13.0 - 17.0 g/dL Final  . HCT 23/76/2831 47.5  39.0 - 52.0 % Final  . MCV 03/22/2020 95.4  80.0 - 100.0 fL Final  . MCH 03/22/2020 32.5  26.0 - 34.0 pg Final  . MCHC 03/22/2020 34.1  30.0 - 36.0 g/dL Final  . RDW 51/76/1607 12.5  11.5 - 15.5 % Final  . Platelets 03/22/2020 191  150 - 400 K/uL Final  . nRBC 03/22/2020 0.0  0.0 - 0.2 % Final  . Neutrophils Relative % 03/22/2020 67  % Final  . Neutro Abs 03/22/2020 4.8  1.7 - 7.7 K/uL Final  . Lymphocytes Relative 03/22/2020 22  % Final  . Lymphs Abs 03/22/2020 1.6  0.7 - 4.0 K/uL Final  . Monocytes Relative 03/22/2020 11  % Final  . Monocytes Absolute 03/22/2020 0.8  0.1 - 1.0 K/uL Final  . Eosinophils Relative 03/22/2020 0  % Final  . Eosinophils Absolute 03/22/2020 0.0  0.0 - 0.5 K/uL Final  . Basophils Relative 03/22/2020 0  % Final  . Basophils Absolute 03/22/2020 0.0  0.0 - 0.1 K/uL Final  . Immature Granulocytes 03/22/2020 0  % Final  . Abs Immature Granulocytes 03/22/2020 0.01  0.00 - 0.07 K/uL Final   Performed at [facility], [address] [place], [place] [address]  . Salicylate Lvl 03/22/2020 <7.0* 7.0 - 30.0 mg/dL Final   Performed at [facility], [address], [place], [place] [address]  . Acetaminophen (Tylenol), Serum 03/22/2020 <10* 10 - 30 ug/mL Final   Comment: (NOTE) Therapeutic concentrations vary significantly. A range of 10-30 ug/mL  may be an effective concentration for many patients. However, some  are best treated at concentrations outside of this range. Acetaminophen concentrations >150 ug/mL at 4 hours after ingestion  and >50 ug/mL at 12 hours after ingestion are often associated with  toxic reactions.  Performed at [facility], [address], [place], [place] [address]   Admission on 03/20/2020, Discharged on 03/21/2020  Component Date Value Ref Range Status  .  Acetaminophen (Tylenol), Serum 03/20/2020 <10* 10 - 30 ug/mL Final   Comment: (NOTE) Therapeutic concentrations vary significantly. A range of 10-30 ug/mL  may be an effective concentration for many patients. However, some  are best treated at concentrations outside of this range. Acetaminophen concentrations >150 ug/mL at 4 hours after ingestion  and >50 ug/mL at 12 hours after ingestion are often associated with  toxic reactions.  Performed at [facility], [address] [place], [place] [address]   . WBC 03/20/2020 8.5  4.0 - 10.5 K/uL Final  . RBC 03/20/2020 5.37  4.22 - 5.81 MIL/uL Final  . Hemoglobin 03/20/2020 17.5* 13.0 - 17.0 g/dL Final  . HCT 36/64/4034 50.5  39.0 - 52.0 % Final  . MCV 03/20/2020 94.0  80.0 - 100.0 fL Final  . MCH 03/20/2020 32.6  26.0 - 34.0 pg Final  . MCHC 03/20/2020 34.7  30.0 - 36.0 g/dL Final  . RDW 74/25/9563 12.5  11.5 - 15.5 % Final  . Platelets 03/20/2020 214  150 - 400 K/uL Final  . nRBC 03/20/2020 0.0  0.0 - 0.2 % Final   Performed at [facility], [address], [place], [place] [address]  . Sodium 03/20/2020 137  135 - 145 mmol/L Final   LYTES REPEATED / JAG  . Potassium 03/20/2020 3.4* 3.5 - 5.1 mmol/L Final  . Chloride 03/20/2020 99  98 - 111 mmol/L Final  . CO2 03/20/2020 12* 22 - 32 mmol/L Final  . Glucose, Bld 03/20/2020 150* 70 - 99 mg/dL Final   Glucose reference range applies only to samples taken after fasting for at least 8 hours.  . BUN 03/20/2020 13  6 - 20 mg/dL Final  . Creatinine, Ser 03/20/2020 1.38* 0.61 - 1.24 mg/dL Final  . Calcium 33/29/5188 9.9  8.9 - 10.3 mg/dL Final  . Total Protein 03/20/2020 8.8* 6.5 - 8.1 g/dL Final  . Albumin 41/66/0630 5.0  3.5 - 5.0 g/dL Final  . AST 16/07/930 61* 15 - 41 U/L Final  . ALT 03/20/2020 46* 0 - 44 U/L Final   RESULT CONFIRMED BY MANUAL DILUTION / JAG  . Alkaline Phosphatase 03/20/2020 89  38 - 126 U/L Final  . Total Bilirubin 03/20/2020 3.3* 0.3 - 1.2  mg/dL Final  . GFR calc non Af Amer 03/20/2020 >60  >60 mL/min Final  . GFR calc Af  Amer 03/20/2020 >60  >60 mL/min Final  . Anion gap 03/20/2020 26* 5 - 15 Final   Performed at [facility], [address] [place], [place] [address]  . Alcohol, Ethyl (B) 03/20/2020 <10  <10 mg/dL Final   Comment: (NOTE) Lowest detectable limit for serum alcohol is 10 mg/dL.  For medical purposes only. Performed at [facility], [address] [place], [place] [address]   . Salicylate Lvl 03/20/2020 <7.0* 7.0 - 30.0 mg/dL Final   Performed at [facility], [address] [place] [place], [place] [address]  . Total CK 03/20/2020 401* 49 - 397 U/L Final   Performed at [facility], [address] [place], [place] [address]  Office Visit on 02/13/2020  Component Date Value Ref Range Status  . CD4 T Cell Abs 02/13/2020 429  400 - 1,790 /uL Final  . CD4 % Helper T Cell 02/13/2020 33  33 - 65 % Final   Performed at [facility], [address] [place], [place] [address]  . HIV 1 RNA Quant 02/13/2020 <20  Copies/mL Final   Comment: HIV-1 RNA Not Detected .   [name] HIV-1 RNA Quant, Log 02/13/2020 <1.30  Log cps/mL Final   Comment: HIV-1 RNA Not Detected . Reference Range:                           Not Detected     copies/mL                           Not Detected Log copies/mL . [name] The test was performed using Real-Time Polymerase Chain Reaction. . . Reportable Range: 20 copies/mL to 10,000,000 copies/mL (1.30 Log copies/mL to 7.00 Log copies/mL). .   . Glucose, Bld 02/13/2020 84  65 - 99 mg/dL Final   Comment: .            Fasting reference interval .   . BUN 02/13/2020 18  7 - 25 mg/dL Final  . Creat 13/02/6577 1.32  0.60 - 1.35 mg/dL Final  . GFR, Est Non African American 02/13/2020 67  > OR = 60 mL/min/1.22m2 Final  . GFR, Est African American 02/13/2020 78  > OR = 60 mL/min/1.61m2 Final  . BUN/Creatinine Ratio 02/13/2020 NOT APPLICABLE   6 - 22 (calc) Final  . Sodium 02/13/2020 143  135 - 146 mmol/L Final  . Potassium 02/13/2020 4.6  3.5 - 5.3 mmol/L Final  . Chloride 02/13/2020 105  98 - 110 mmol/L Final  . CO2 02/13/2020 31  20 - 32 mmol/L Final  . Calcium 02/13/2020 9.9  8.6 - 10.3 mg/dL Final  . Total Protein 02/13/2020 6.9  6.1 - 8.1 g/dL Final  . Albumin 46/96/2952 4.2  3.6 - 5.1 g/dL Final  . Globulin 84/13/2440 2.7  1.9 - 3.7 g/dL (calc) Final  . AG Ratio 02/13/2020 1.6  1.0 - 2.5 (calc) Final  . Total Bilirubin 02/13/2020 0.6  0.2 - 1.2 mg/dL Final  . Alkaline phosphatase (APISO) 02/13/2020 100  36 - 130 U/L Final  . AST 02/13/2020 14  10 - 40 U/L Final  . ALT 02/13/2020 17  9 - 46 U/L Final  . WBC 02/13/2020 5.8  3.8 - 10.8 Thousand/uL Final  . RBC 02/13/2020 4.96  4.20 - 5.80 Million/uL Final  . Hemoglobin 02/13/2020 16.0  13.2 - 17.1 g/dL Final  . HCT 04/30/2535 47.0  38.5 - 50.0 % Final  . MCV 02/13/2020 94.8  80.0 - 100.0 fL Final  . MCH 02/13/2020 32.3  27.0 - 33.0 pg Final  . MCHC 02/13/2020 34.0  32.0 - 36.0 g/dL Final  . RDW 16/04/9603 11.8  11.0 - 15.0 % Final  . Platelets 02/13/2020 243  140 - 400 Thousand/uL Final  . MPV 02/13/2020 9.9  7.5 - 12.5 fL Final  . Neutro Abs 02/13/2020 2,964  1,500 - 7,800 cells/uL Final  . Lymphs Abs 02/13/2020 2,192  850 - 3,900 cells/uL Final  . Absolute Monocytes 02/13/2020 510  200 - 950 cells/uL Final  . Eosinophils Absolute 02/13/2020 93  15 - 500 cells/uL Final  . Basophils Absolute 02/13/2020 41  0 - 200 cells/uL Final  . Neutrophils Relative % 02/13/2020 51.1  % Final  . Total Lymphocyte 02/13/2020 37.8  % Final  . Monocytes Relative 02/13/2020 8.8  % Final  . Eosinophils Relative 02/13/2020 1.6  % Final  . Basophils Relative 02/13/2020 0.7  % Final  . Cholesterol 02/13/2020 152  <200 mg/dL Final  . HDL 54/03/8118 52  > OR = 40 mg/dL Final  . Triglycerides 02/13/2020 100  <150 mg/dL Final  . LDL Cholesterol (Calc) 02/13/2020 80  mg/dL (calc) Final    Comment: Reference range: <100 . Desirable range <100 mg/dL for primary prevention;   <70 mg/dL for patients with CHD or diabetic patients  with > or = 2 CHD risk factors. [name] LDL-C is now calculated using the Martin-Hopkins  calculation, which is a validated novel method providing  better accuracy than the Friedewald equation in the  estimation of LDL-C.  Horald Pollen et al. Lenox Ahr. 1478;295(62): 2061-2068  (http://education.[employer].com/faq/FAQ164)   . Total CHOL/HDL Ratio 02/13/2020 2.9  <1.3 (calc) Final  . Non-HDL Cholesterol (Calc) 02/13/2020 100  <130 mg/dL (calc) Final   Comment: For patients with diabetes plus 1 major ASCVD risk  factor, treating to a non-HDL-C goal of <100 mg/dL  (LDL-C of <08 mg/dL) is considered a therapeutic  option.   . RPR Ser Ql 02/13/2020 REACTIVE* NON-REACTI Final  . Neisseria Gonorrhea 02/13/2020 Negative   Final  . Chlamydia 02/13/2020 Negative   Final  . Comment 02/13/2020 Normal Reference Ranger Chlamydia - Negative   Final  . Comment 02/13/2020 Normal Reference Range Neisseria Gonorrhea - Negative   Final  . RPR Titer 02/13/2020 1:1*  Final  . Fluorescent Treponemal ABS 02/13/2020 REACTIVE* NON-REACTI Final    Allergies: Patient has no known allergies.  PTA Medications: (Not in a hospital admission)   Medical Decision Making  -Patient to remain overnight for observation and stabilization -Restart medications -Reassess by psychiatry in the a.m.    Recommendations  Based on my evaluation patient to be admitted for overnight observation for further observation and stabilization.   Loletta Parish, NP 08/07/20  1:45 PM
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
## 2020-08-07 NOTE — ED Notes (Signed)
Will obtain VS when patient is more calm, patient agitated with [employer] at bedside at this time
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
## 2020-08-07 NOTE — ED Notes (Signed)
[employer] are on the way to pick up patient

## 2020-08-08 MED ORDER — LORAZEPAM 1 MG PO TABS
1.0000 mg | ORAL_TABLET | Freq: Once | ORAL | Status: AC
  Administered 2020-08-08: 1 mg via ORAL
  Filled 2020-08-08: qty 1

## 2020-08-08 MED ORDER — IBUPROFEN 400 MG PO TABS
400.0000 mg | ORAL_TABLET | Freq: Once | ORAL | Status: AC
  Administered 2020-08-08: 400 mg via ORAL
  Filled 2020-08-08: qty 1

## 2020-08-08 MED ORDER — ZIPRASIDONE MESYLATE 20 MG IM SOLR: 20.0000 mg | INTRAMUSCULAR | Status: DC | PRN

## 2020-08-08 MED ORDER — NICOTINE 21 MG/24HR TD PT24
21.0000 mg | MEDICATED_PATCH | Freq: Every day | TRANSDERMAL | Status: DC
  Administered 2020-08-08 – 2020-08-10 (×3): 21 mg via TRANSDERMAL
  Filled 2020-08-08 (×3): qty 1

## 2020-08-08 MED ORDER — ACETAMINOPHEN 500 MG PO TABS
1000.0000 mg | ORAL_TABLET | Freq: Once | ORAL | Status: AC
  Administered 2020-08-08: 1000 mg via ORAL
  Filled 2020-08-08: qty 2

## 2020-08-08 MED ORDER — ACETAMINOPHEN 325 MG PO TABS
650.0000 mg | ORAL_TABLET | Freq: Four times a day (QID) | ORAL | Status: DC | PRN
  Administered 2020-08-08 – 2020-08-09 (×2): 650 mg via ORAL
  Filled 2020-08-08 (×3): qty 2

## 2020-08-08 NOTE — Progress Notes (Signed)
   08/08/20 1927  Clinical Encounter Type  Visited With Patient  Visit Type Follow-up;Psychological support;Spiritual support;Social support  Referral From Nurse  Consult/Referral To Chaplain  Spiritual Encounters  Spiritual Needs Prayer;[facility] text;Emotional;Grief support  Stress Factors  Patient Stress Factors Financial concerns;Family relationships;Lack of knowledge;Loss of control;Health changes   Chaplain responded to page from [place]. Chaplain engaged in active listening, emotional, and grief support. Chaplain also provided spiritual support and circled passages in the Pt's Bible. Chaplain prayed with Pt. Pt has many stressors, including housing and job insecurity, health issues, recent death of brother, and current mental status. Chaplain remains available.    This note was prepared by Chaplain Resident, Tacy Learn, MDiv. Chaplain remains available as needed through the on-call pager: [phone].

## 2020-08-08 NOTE — ED Notes (Signed)
Pt moved from blue zone to purple zone #50 without problem. Pt sleeping. No restraints in place. They were removed before this RN received pt. Pt will wake up and speak when asked to. Pt wants to remain on stretcher instead on moving to hospital bed. Pt has mask in place. Calm and cooperative. IVC paperwork given to this RN by previous Charity fundraiser. Will recheck paperwork. Pt stable at this time.

## 2020-08-08 NOTE — Progress Notes (Signed)
   08/08/20 1245  Clinical Encounter Type  Visited With Patient not available  Visit Type Initial  Referral From Nurse  Consult/Referral To Chaplain  Spiritual Encounters  Spiritual Needs Sacred text   Chaplain responded to page. Pt requested Bible. Pt was asleep when Chaplain arrived, so Bible was left on bedside table. Chaplain remains available.   This note was prepared by Chaplain Resident, Tacy Learn, MDiv. Chaplain remains available as needed through the on-call pager: [phone].

## 2020-08-08 NOTE — ED Notes (Signed)
Patient was given a Snack and Drink. 

## 2020-08-08 NOTE — ED Notes (Signed)
Pt instructed to elevate RLE.

## 2020-08-08 NOTE — ED Notes (Signed)
Chaplain called to visit pt today if possible.
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
## 2020-08-08 NOTE — ED Notes (Signed)
Chaplain brought pt bible as per request. Pt resting at this time.

## 2020-08-08 NOTE — ED Notes (Signed)
Chaplin at bedside

## 2020-08-08 NOTE — ED Notes (Signed)
Gave pt chapstick to use and removed from room. Notified EDP that pt is requesting pain medication stronger than Tylenol. EDP ordered ibuprofen, pt refused.

## 2020-08-09 MED ORDER — HYDROCODONE-ACETAMINOPHEN 5-325 MG PO TABS
1.0000 | ORAL_TABLET | Freq: Four times a day (QID) | ORAL | Status: DC | PRN
  Administered 2020-08-09: 1 via ORAL
  Filled 2020-08-09: qty 1

## 2020-08-09 MED ORDER — GABAPENTIN 300 MG PO CAPS
300.0000 mg | ORAL_CAPSULE | Freq: Four times a day (QID) | ORAL | Status: DC
  Administered 2020-08-09 – 2020-08-10 (×2): 300 mg via ORAL
  Filled 2020-08-09 (×2): qty 1

## 2020-08-09 MED ORDER — LORAZEPAM 1 MG PO TABS
1.0000 mg | ORAL_TABLET | Freq: Once | ORAL | Status: AC
  Administered 2020-08-09: 1 mg via ORAL
  Filled 2020-08-09: qty 1

## 2020-08-09 MED ORDER — FLUOXETINE HCL 20 MG PO CAPS
20.0000 mg | ORAL_CAPSULE | Freq: Every day | ORAL | Status: DC
  Administered 2020-08-09 – 2020-08-10 (×2): 20 mg via ORAL
  Filled 2020-08-09 (×2): qty 1

## 2020-08-09 MED ORDER — MELATONIN 5 MG PO TABS
10.0000 mg | ORAL_TABLET | Freq: Every day | ORAL | Status: DC
  Administered 2020-08-09: 10 mg via ORAL
  Filled 2020-08-09: qty 2

## 2020-08-09 MED ORDER — GABAPENTIN 300 MG PO CAPS
300.0000 mg | ORAL_CAPSULE | Freq: Three times a day (TID) | ORAL | Status: DC
  Administered 2020-08-09: 300 mg via ORAL
  Filled 2020-08-09: qty 1

## 2020-08-09 NOTE — ED Notes (Signed)
Pt awake in and out of his room several times, states that he feels he is about to jump out of skin requesting something for anxiety.  Provider notified, verbal order for more ativan

## 2020-08-09 NOTE — ED Provider Notes (Signed)
Emergency Medicine Observation Re-evaluation Note  Justin [name] is a 40 y.o. male, seen on rounds today.  Pt initially presented to the ED for complaints of No chief complaint on file. Currently, the patient is awaiting psych placement.  Pt cmplains of pain in his foot   Physical Exam  BP 116/80   Pulse 87   Temp 98.6 F (37 C) (Oral)   Resp 14   Ht 6' (1.829 m)   Wt 99.8 kg   SpO2 98%   BMI 29.84 kg/m  Physical Exam General: wdwn Cardiac: normal Lungs: clear Psych: awake pleasant   ED Course / MDM  EKG:    I have reviewed the labs performed to date as well as medications administered while in observation.  Recent changes in the last 24 hours include  Increased pain in foot .  Plan  Current plan is for  Patient is under full IVC at this time.   Osie Cheeks 08/09/20 1525    Gwyneth Sprout, MD 08/09/20 2051
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
## 2020-08-09 NOTE — ED Notes (Signed)
Pt awake, in bed, requested urinal. Introduced self to pt. Pt calm, conversational, and cooperative. Medication administered and vitals taken without incident. Pt reports feeling anxious and requests medication to help with this. Will consult with MD.

## 2020-08-09 NOTE — ED Notes (Signed)
Lunch Tray Ordered @ 1308.

## 2020-08-10 ENCOUNTER — Encounter: Payer: Self-pay | Admitting: *Deleted

## 2020-08-10 DIAGNOSIS — F1594 Other stimulant use, unspecified with stimulant-induced mood disorder: Secondary | ICD-10-CM

## 2020-08-10 MED ORDER — HYDROXYZINE HCL 25 MG PO TABS
25.0000 mg | ORAL_TABLET | Freq: Four times a day (QID) | ORAL | Status: DC | PRN
  Administered 2020-08-10: 25 mg via ORAL
  Filled 2020-08-10: qty 1

## 2020-08-10 NOTE — ED Notes (Signed)
Patient asleep, sitter at bedside.

## 2020-08-10 NOTE — Progress Notes (Signed)
RNCM consulted regarding transportation needs for pt.  RNCM provided [facility] Transportation for transportation from hospital.  Pt has [facility] Waiver and Release of Liability Form on file, therefore agreeing to written terms.

## 2020-08-10 NOTE — ED Notes (Signed)
Pt requesting to have Ativan ordered. Pt oriented that per his provider he will be dc home today and he will be able to go home in a later time. Pt verbalized understanding.

## 2020-08-10 NOTE — ED Notes (Addendum)
When entering room to collect morning vitals pt groaning in pain reporting "everything hurts", "I think I mighthad a seizure", mild injury noted to patient upper lip. Pt A&Ox4, not postictal.

## 2020-08-10 NOTE — Discharge Instructions (Addendum)
Substance Abuse Treatment Programs ° °Intensive Outpatient Programs °[facility]     °[address]      °[place], [place]                   °[phone]      ° °[facility] °[address] °[place], [place] °[phone] ° °[facility]     °(Inpatient and outpatient)     °[address]           °[phone]   ° °[facility] °[phone] (Suboxone and Methadone) ° °[address]      °[place], [place] [address]      °336-882-2125      ° °[address] °[place], [place] °852-3033 ° °[facility] (Outpatient/Inpatient, [facility])    °(insurance only) [phone]      °       °[facility] (Groups & Residential) °[place], [place] °336-389-1413 ° °   °[facility]     °[address]     °[place], [place]      °[phone]      ° °[facility] (for caregivers and family) °[address] °[place], [place] °336-299-4655 ° ° ° ° ° °Residential Treatment Programs °[facility]      °[address], [place], [place] [address]  °([phone]      ° °[facility] °[address] [place], [place] [address] °[phone] ° °[facility]        °336-248-8914      ° °[facility] °1-800-659-3381 ° °[facility]             °[address]                                         °[place], [place]                                                °877-615-2722 or 336-784-9470                              ° °[facility] [place] °[address] °[place], [address] °1.877.941.8954 ° °[facility]    °[address]      °[place], [place]     °[phone]      ° °[facility] °[address] °[place], [place] °336-285-9073 ° °[facility]   °[address]     °[place], [place] [address]     °336-899-1550      °Admissions: 8am-3pm M-F ° °[facility] °[address] °[place] °336-227-7417 ° °[facility]: Residential Program (90 Days)   °[place], [place]      °336-725-8389 or 800-758-6077    ° °[facility] °[place], [place] °(Walk in Hours over the weekend or by referral) ° °[facility] °[address], [place], [place] [address] °(336) [phone] ° °Crisis Mobile: [facility]:  1-877-626-1772 (for crisis response 24 hours a day) °[facility] Hotline:      1-800-256-2452 °Outpatient Psychiatry and Counseling ° °[facility]: Mobile Crisis   Management 24 hours:  1-877-626-1772 ° °[facility] sliding scale fee and walk in schedule: M-F 8am-12pm/1pm-3pm °[address]  °[place], [place] [address] °[phone] ° °[facility] °[address] °[place], [place] [address] °336-703-9650 ° °[facility] (Formerly known [facility] new patient walk-in appointments available Monday - Friday 8am -3pm.          °[address] °[place], [place] [address] °336-676-6840 or crisis line- 336-676-6905 ° °[facility] Intensive Outpatient Therapy Program °[address] °[place], [place] [address] °336-832-9804 ° °[facility]                  °Crisis Services      °336.641.4993      °[address]     °[place], [place] [address]                ° °[facility]   °[facility] °[phone] °[address] °[place], [place] [address] ° ° °[facility]          °[address],  °[place], [place] [address]       °(336) [phone] ° °[facility] °[address] °[place], [place] [address] °336-292-1510 ° °[facility]    °[address]    °[place], [place] [address]     °336-632-3505      ° °Parish McKinney, MD     °[address]     °[place] [address]     °336-282-1251     °  °[facility] °[address] °[name] [address] ° °[facility]     °[address]     °[place], [place]      °336-542-2076      ° °[facility] °Shamsher Ahluwalia, MD °[address] °[place], [place] [address] °336-420-9558 ° °[facility]     °[address]     °[place], [place] [address]     °336-274-1237      ° °[facility] °[address] °[place], [place] [address] °336-854-4450 °Resources for Temporary Residential Assistance/Crisis Centers ° °DAY CENTERS °[facility] °M-F 8am-3pm   °[address] [place], [place] [address]   336-332-0824 °Services include: laundry, barbering, support groups, case management, phone  & computer access, showers, AA/NA mtgs, mental health/substance abuse nurse, job skills class, disability information, VA assistance, spiritual classes, etc.  ° °HOMELESS SHELTERS ° °[facility]     °[facility]   °[address], [place]     °336.271.5959       °       °[facility] (women and children)       °[address] °[place], [place] [address] °336-275-0820 °[email or web address] for application and process °Application Required ° °[facility]   °[address]    °[place] [address]     °[phone]       °             °[facility] °[address] °[place], [place] [address] °336.273.5572 °336-235-0363(schedule application appt.) °Application Required ° °[facility] (women only)    °[address]     °[place],  [address]     °336-884-1039      °  Intake starts 6pm daily °Need valid ID, SSC, & Police report °[facility] [place] °[address] °[place], [place] °336-881-5420 °Application Required ° °[facility] (men only)     °[address]      °[place], [place]     °[phone]      ° °[facility] °(Pregnant women only) °[address] °[place], [place] °336-275-0206 ° °[facility]      °[address]      °[place], [place] [address]     °336-722-9951      °       °[facility] °[address] °[place], [place] °336-723-1848 °90 day commitment/SA/Application process ° °[facility](men only[facility]     °[address]     °[place], [place]     °336-748-1962       °Check-in at 7pm     °       °[facility] °[address] °[place], [place] [address] °336-248-6684 °Men/Women/Women and Children must be there by 7 pm ° °[facility] °[place],  °336-722-8721                ° °

## 2020-08-10 NOTE — Consult Note (Signed)
[facility] Face-to-Face Psychiatry Consult   Reason for Consult:  IVC'd - Paranoid Referring Physician:  EDP Patient Identification: Justin [name] MRN:  [id number] Principal Diagnosis: Methamphetamine-induced mood disorder (HCC) Diagnosis:  Principal Problem:   Methamphetamine-induced mood disorder (HCC) Active Problems:   Methamphetamine use disorder, severe, dependence (HCC)   Total Time spent with patient: 15 minutes  Subjective:   Justin [name] is a 40 y.o. male was seen and evaluated face-to-face.  Patient is well-known to this service. he is awake, alert and oriented x3.  He presents calm, quiet and cooperative.  denies suicidal or homicidal ideations.  Denies auditory or visual hallucinations.  Reports " I think I had a seizure yesterday."  Patient does endorse mild anxiety this morning. Patient received 25 mg Atarax for reported sympotms.  Chart reviewed UDS positive for amphetamines. Case staffed with attending psychiatrist Lucianne Muss will recommend discharge.  Patient to follow-up with peers support and keep follow-up with outpatient providers.  Support, encouragement and  reassurance was provided.  HPI:  Per admission assessment note: Justin [name] is a 40 year old male presented to [facility] via IVC by caseworker at [facility] for paranoia. Patient presented to [facility] disorganized and complaining of auditory hallucinations and paranoid ideations of "someone" after him.  Patient presented to [facility] 0200 this a.m. voluntarily with [employer] with chief complaint "I want to check to make sure my moral compass is intact".  Past Psychiatric History:   Risk to Self:   Risk to Others:   Prior Inpatient Therapy:   Prior Outpatient Therapy:    Past Medical History:  Past Medical History:  Diagnosis Date  . Endocarditis   . Hepatitis C   . History of syphilis 08/25/2014   Treated by [facility] 07-2014.  [name] HIV (human immunodeficiency virus infection) (HCC)   . Seizures (HCC)     Past Surgical History:   Procedure Laterality Date  . hand surgery Right   . LUMBAR PUNCTURE  08/08/2019      . WRIST SURGERY     Family History:  Family History  Adopted: Yes  Family history unknown: Yes   Family Psychiatric  History:  Social History:  Social History   Substance and Sexual Activity  Alcohol Use Yes   Comment: 1-2 a week      Social History   Substance and Sexual Activity  Drug Use Yes  . Frequency: 14.0 times per week  . Types: Marijuana, Methamphetamines    Social History   Socioeconomic History  . Marital status: Single    Spouse name: Not on file  . Number of children: Not on file  . Years of education: Not on file  . Highest education level: Not on file  Occupational History  . Not on file  Tobacco Use  . Smoking status: Current Every Day Smoker    Types: Cigarettes  . Smokeless tobacco: Never Used  Vaping Use  . Vaping Use: Never used  Substance and Sexual Activity  . Alcohol use: Yes    Comment: 1-2 a week   . Drug use: Yes    Frequency: 14.0 times per week    Types: Marijuana, Methamphetamines  . Sexual activity: Yes    Partners: Male    Birth control/protection: Condom    Comment: given condoms  Other Topics Concern  . Not on file  Social History Narrative   ** Merged History Encounter **       ** Merged History Encounter **       **  Merged History Encounter **       Social Determinants of Health   Financial Resource Strain: Not on file  Food Insecurity: Not on file  Transportation Needs: Not on file  Physical Activity: Not on file  Stress: Not on file  Social Connections: Not on file   Additional Social History:    Allergies:  No Known Allergies  Labs: No results found for this or any previous visit (from the past 48 hour(s)).  Current Facility-Administered Medications  Medication Dose Route Frequency Provider Last Rate Last Admin  . acetaminophen (TYLENOL) tablet 650 mg  650 mg Oral Q6H PRN Cristina Gong, PA-C   650 mg at  08/09/20 2125  . bictegravir-emtricitabine-tenofovir AF (BIKTARVY) 50-200-25 MG per tablet 1 tablet  1 tablet Oral Daily Cristina Gong, PA-C   1 tablet at 08/10/20 1020  . doxycycline (VIBRA-TABS) tablet 100 mg  100 mg Oral Q12H Cristina Gong, PA-C   100 mg at 08/10/20 0950  . FLUoxetine (PROZAC) capsule 20 mg  20 mg Oral Daily Elson Areas, [place]   20 mg at 08/10/20 7017  . gabapentin (NEURONTIN) capsule 300 mg  300 mg Oral QID Charm Rings, NP   300 mg at 08/10/20 7939  . hydrOXYzine (ATARAX/VISTARIL) tablet 25 mg  25 mg Oral Q6H PRN Little, Ambrose Finland, MD   25 mg at 08/10/20 0950  . levETIRAcetam (KEPPRA) tablet 500 mg  500 mg Oral BID Cristina Gong, PA-C   500 mg at 08/10/20 0300  . melatonin tablet 10 mg  10 mg Oral QHS Elson Areas, [place]   10 mg at 08/09/20 2124  . nicotine (NICODERM CQ - dosed in mg/24 hours) patch 21 mg  21 mg Transdermal Daily Cristina Gong, PA-C   21 mg at 08/10/20 9233  . ziprasidone (GEODON) injection 20 mg  20 mg Intramuscular PRN Gwyneth Sprout, MD       Current Outpatient Medications  Medication Sig Dispense Refill  . bictegravir-emtricitabine-tenofovir AF (BIKTARVY) 50-200-25 MG TABS tablet Take 1 tablet by mouth daily. 30 tablet 5  . FLUoxetine (PROZAC) 20 MG tablet Take 20 mg by mouth daily.    [name] HYDROcodone-acetaminophen (NORCO/VICODIN) 5-325 MG tablet Take 1 tablet by mouth every 6 (six) hours as needed for moderate pain.    [name] levETIRAcetam (KEPPRA) 500 MG tablet Take 1 tablet (500 mg total) by mouth 2 (two) times daily. 60 tablet 5  . Melatonin 10 MG TABS Take 20 mg by mouth at bedtime.      Musculoskeletal: Strength & Muscle Tone: within normal limits Gait & Station: normal Patient leans: N/A  Psychiatric Specialty Exam: Physical Exam  Review of Systems  Blood pressure 119/68, pulse 87, temperature 98.1 F (36.7 C), temperature source (S) Oral, resp. rate 20, height 6' (1.829 m), weight 99.8 kg, SpO2 95  %.Body mass index is 29.84 kg/m.  General Appearance: Casual  Eye Contact:  Good  Speech:  Clear and Coherent  Volume:  Normal  Mood:  Anxious and Depressed  Affect:  Congruent  Thought Process:  Coherent  Orientation:  Full (Time, Place, and Person)  Thought Content:  Hallucinations: None  Suicidal Thoughts:  No  Homicidal Thoughts:  No  Memory:  Immediate;   Fair Recent;   Fair  Judgement:  Fair  Insight:  Fair  Psychomotor Activity:  Normal  Concentration:  Concentration: Fair  Recall:  [employer] of Knowledge:  Fair  Language:  Fair  Akathisia:  No  Handed:  Right  AIMS (if indicated):     Assets:  Communication Skills Desire for Improvement Resilience Social Support  ADL's:  Intact  Cognition:  WNL  Sleep:      Keep follow-up appointment with peers support CSW to provide additional outpatient resources NP spoke to attending regarding discharge disposition recommendation  Disposition: No evidence of imminent risk to self or others at present.   Patient does not meet criteria for psychiatric inpatient admission. Supportive therapy provided about ongoing stressors. Refer to IOP. Discussed crisis plan, support from social network, calling 911, coming to the Emergency Department, and calling Suicide Hotline.  Oneta Rack, NP 08/10/2020 11:18 AM

## 2020-08-10 NOTE — Discharge Planning (Signed)
Peer Support met with pt at beside.  Pt agreeable to placement for detox.  Peer Support actively working to secure treatment for pt.

## 2020-08-10 NOTE — ED Notes (Signed)
IVC Breakfast order placed 

## 2020-08-10 NOTE — Progress Notes (Signed)
CSW and RN Durward Mallard contacted Justin [name] with peer support. Justin [name] plans to meet with patient about being admitted into [facility] in [place].

## 2020-08-10 NOTE — ED Notes (Signed)
Pt sitter left, no replacement available. Will continue to monitor.

## 2020-08-10 NOTE — ED Provider Notes (Signed)
Emergency Medicine Observation Re-evaluation Note  Justin [name] is a 40 y.o. male, seen on rounds today.  Pt initially presented to the ED for complaints of IVC secondary to paranoia. Currently, the patient is resting calmly in his hospital bed.  Physical Exam  BP 119/68 (BP Location: Right Arm)   Pulse 87   Temp (S) 98.1 F (36.7 C) (Oral)   Resp 20   Ht 6' (1.829 m)   Wt 99.8 kg   SpO2 95%   BMI 29.84 kg/m  Physical Exam General: well appearing NAD Cardiac: normal rate Lungs: normal work of breathing Psych: calm  ED Course / MDM  EKG:    I have reviewed the labs performed to date as well as medications administered while in observation.  Recent changes in the last 24 hours include none.  Plan  Current plan is for discharge. Behavioral health NP spoke with this provider in the ED and notified me that he is psychiatrically cleared and safe to discharge home. Patient is not under full IVC at this time.   Paris Lore, PA-C 08/10/20 1447    Little, Ambrose Finland, MD 08/10/20 [phone]

## 2020-08-10 NOTE — ED Notes (Addendum)
Pt evaluated by Frederik Pear

## 2020-08-10 NOTE — ED Notes (Signed)
Supplies provided to pt for taking a shower, pt calm and cooperative at this time, requesting food, pt oriented that lunch will arrive around 40-50 min from now around 12:40 pm. Pt verbalized understanding.

## 2020-08-10 NOTE — ED Notes (Signed)
Pt ate breakfast and took morning medication without incident.

## 2020-08-10 NOTE — ED Notes (Signed)
Pt asleep.

## 2020-08-12 ENCOUNTER — Encounter: Payer: Self-pay | Admitting: Internal Medicine

## 2020-08-13 ENCOUNTER — Encounter (HOSPITAL_COMMUNITY): Payer: Self-pay | Admitting: Emergency Medicine

## 2020-08-13 ENCOUNTER — Other Ambulatory Visit: Payer: Self-pay

## 2020-08-13 ENCOUNTER — Emergency Department (HOSPITAL_COMMUNITY)
Admission: EM | Admit: 2020-08-13 | Discharge: 2020-08-15 | Disposition: A | Discharge status: Home / Self Care | Payer: Self-pay | Attending: Emergency Medicine | Admitting: Emergency Medicine

## 2020-08-13 ENCOUNTER — Ambulatory Visit (HOSPITAL_COMMUNITY)
Admission: EM | Admit: 2020-08-13 | Discharge: 2020-08-13 | Disposition: A | Discharge status: Hospice | Payer: No Payment, Other | Attending: Psychiatry | Admitting: Psychiatry

## 2020-08-13 DIAGNOSIS — B20 Human immunodeficiency virus [HIV] disease: Secondary | ICD-10-CM

## 2020-08-13 DIAGNOSIS — R45851 Suicidal ideations: Secondary | ICD-10-CM

## 2020-08-13 DIAGNOSIS — B182 Chronic viral hepatitis C: Secondary | ICD-10-CM

## 2020-08-13 DIAGNOSIS — F17210 Nicotine dependence, cigarettes, uncomplicated: Secondary | ICD-10-CM | POA: Insufficient documentation

## 2020-08-13 DIAGNOSIS — R440 Auditory hallucinations: Secondary | ICD-10-CM

## 2020-08-13 DIAGNOSIS — F15950 Other stimulant use, unspecified with stimulant-induced psychotic disorder with delusions: Secondary | ICD-10-CM | POA: Diagnosis present

## 2020-08-13 DIAGNOSIS — Z21 Asymptomatic human immunodeficiency virus [HIV] infection status: Secondary | ICD-10-CM | POA: Insufficient documentation

## 2020-08-13 DIAGNOSIS — Z20822 Contact with and (suspected) exposure to covid-19: Secondary | ICD-10-CM | POA: Insufficient documentation

## 2020-08-13 DIAGNOSIS — F1594 Other stimulant use, unspecified with stimulant-induced mood disorder: Secondary | ICD-10-CM | POA: Diagnosis present

## 2020-08-13 DIAGNOSIS — F1520 Other stimulant dependence, uncomplicated: Secondary | ICD-10-CM

## 2020-08-13 DIAGNOSIS — F15959 Other stimulant use, unspecified with stimulant-induced psychotic disorder, unspecified: Secondary | ICD-10-CM | POA: Insufficient documentation

## 2020-08-13 DIAGNOSIS — Z113 Encounter for screening for infections with a predominantly sexual mode of transmission: Secondary | ICD-10-CM

## 2020-08-13 LAB — RESP PANEL BY RT-PCR (FLU A&B, COVID) ARPGX2
Influenza A by PCR: NEGATIVE
Influenza B by PCR: NEGATIVE
SARS Coronavirus 2 by RT PCR: NEGATIVE

## 2020-08-13 LAB — COMPREHENSIVE METABOLIC PANEL
ALT: 27 U/L (ref 0–44)
AST: 25 U/L (ref 15–41)
Albumin: 4.1 g/dL (ref 3.5–5.0)
Alkaline Phosphatase: 68 U/L (ref 38–126)
Anion gap: 11 (ref 5–15)
BUN: 10 mg/dL (ref 6–20)
CO2: 24 mmol/L (ref 22–32)
Calcium: 9.6 mg/dL (ref 8.9–10.3)
Chloride: 103 mmol/L (ref 98–111)
Creatinine, Ser: 1.06 mg/dL (ref 0.61–1.24)
GFR, Estimated: >60 mL/min (ref >60)
Glucose, Bld: 102 mg/dL — ABNORMAL HIGH (ref 70–99)
Potassium: 3.8 mmol/L (ref 3.5–5.1)
Sodium: 138 mmol/L (ref 135–145)
Total Bilirubin: 2.1 mg/dL — ABNORMAL HIGH (ref 0.3–1.2)
Total Protein: 7 g/dL (ref 6.5–8.1)

## 2020-08-13 LAB — CBC
HCT: 43.6 % (ref 39.0–52.0)
Hemoglobin: 14.7 g/dL (ref 13.0–17.0)
MCH: 33.1 pg (ref 26.0–34.0)
MCHC: 33.7 g/dL (ref 30.0–36.0)
MCV: 98.2 fL (ref 80.0–100.0)
Platelets: 213 10*3/uL (ref 150–400)
RBC: 4.44 MIL/uL (ref 4.22–5.81)
RDW: 12.2 % (ref 11.5–15.5)
WBC: 5.5 10*3/uL (ref 4.0–10.5)
nRBC: 0 % (ref 0.0–0.2)

## 2020-08-13 LAB — ETHANOL: Alcohol, Ethyl (B): <10 mg/dL (ref <10)

## 2020-08-13 LAB — SALICYLATE LEVEL: Salicylate Lvl: <7 mg/dL — ABNORMAL LOW (ref 7.0–30.0)

## 2020-08-13 LAB — ACETAMINOPHEN LEVEL: Acetaminophen (Tylenol), Serum: <10 ug/mL — ABNORMAL LOW (ref 10–30)

## 2020-08-13 MED ORDER — ZIPRASIDONE MESYLATE 20 MG IM SOLR
20.0000 mg | Freq: Once | INTRAMUSCULAR | Status: AC
  Administered 2020-08-13: 20 mg via INTRAMUSCULAR
  Filled 2020-08-13: qty 20

## 2020-08-13 MED ORDER — MELATONIN 5 MG PO TABS
10.0000 mg | ORAL_TABLET | Freq: Every day | ORAL | Status: DC
  Administered 2020-08-13 – 2020-08-14 (×2): 10 mg via ORAL
  Filled 2020-08-13 (×2): qty 2

## 2020-08-13 MED ORDER — FLUOXETINE HCL 20 MG PO CAPS
20.0000 mg | ORAL_CAPSULE | Freq: Every day | ORAL | Status: DC
  Administered 2020-08-13 – 2020-08-15 (×3): 20 mg via ORAL
  Filled 2020-08-13 (×5): qty 1

## 2020-08-13 MED ORDER — OLANZAPINE 7.5 MG PO TABS
15.0000 mg | ORAL_TABLET | Freq: Once | ORAL | Status: AC
  Administered 2020-08-13: 15 mg via ORAL
  Filled 2020-08-13: qty 2

## 2020-08-13 MED ORDER — LEVETIRACETAM 500 MG PO TABS
500.0000 mg | ORAL_TABLET | Freq: Two times a day (BID) | ORAL | Status: DC
  Administered 2020-08-13 – 2020-08-15 (×4): 500 mg via ORAL
  Filled 2020-08-13 (×4): qty 1

## 2020-08-13 MED ORDER — BICTEGRAVIR-EMTRICITAB-TENOFOV 50-200-25 MG PO TABS
1.0000 | ORAL_TABLET | Freq: Every day | ORAL | Status: DC
  Administered 2020-08-13 – 2020-08-15 (×3): 1 via ORAL
  Filled 2020-08-13 (×3): qty 1

## 2020-08-13 NOTE — ED Notes (Signed)
Pt refused to provide urine sample.

## 2020-08-13 NOTE — ED Provider Notes (Signed)
**Justin [name] De-Identified via Obfuscation** Justin Justin [name] EMERGENCY DEPARTMENT Provider Justin [name]   CSN: [id number] Arrival date & time: 08/13/20  1237     History Chief Complaint  Patient presents with  . Medical Clearance    Justin Justin [name] is a 40 y.o. male with past medical history of HIV, endocarditis, hepatitis C, syphilis, seizures, methamphetamine induced psychotic disorder, acute psychosis of presents the emergency department today for auditory hallucinations sent from Justin Justin [name].  He originally presented to Justin Justin [name] accompanied by Patent examiner, he was brought in because he was having auditory  hallucinations.  He reports that he was sober from methamphetamines for 30 days and then he relapsed 4 days ago according to Justin Justin [name] Justin [name].  Justin [name] states that he has not eaten in 4 days and that has been walking the streets for the past few days since his brother had kicked him out of the house, patient is sleepy on my exam, is extremely rude and tells me that he does not want to be evaluated.  Will not let me listen to his lungs or do physical exam.  Uses profanity, will then go back to sleep.  According to Justin Justin [name] Justin [name] by FNP Justin Justin [name] money he reports that he was also using profanity with him.  Patient does have history of acute psychosis due to methamphetamine use.  Reports that he has not taken any of his medications in approximately 2 to 3 days.  No suicide or homicidal ideations.  Plan is for patient to be transferred to Justin Justin [name], Justin [name] for observation, not appropriate for Justin Justin [name].  Patient was given 15 mg of Zyprexa prior to leaving Justin [name].    Disposition:  Per Justin Calkins, NP, due to patient's psychosis and disorganization, patient will be monitored in the Justin [name] overnight for safety and stability and will be re-evaluated in the morning after receiving medications for his psychosis.  HPI     Past Medical History:  Diagnosis Date  . Endocarditis   . Hepatitis C   . History of syphilis 08/25/2014   Treated by Justin [name] 07-2014.  [name] HIV (human  immunodeficiency virus infection) (HCC)   . Seizures [name])     Patient Active Problem List   Diagnosis Date Noted  . Suicidal ideations 07/08/2020  . 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HCC)   . Seizure (HCC) 06/26/2020  . Seizure-like activity (HCC) 06/25/2020  . Amphetamine and psychostimulant-induced psychosis with delusions (HCC)   . 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 Acute psychosis (HCC)   . Substance use disorder 08/07/2019  . Encephalopathy 08/06/2019  . Chronic hepatitis C without hepatic coma (HCC) 08/17/2017  . Screening examination for venereal disease 05/03/2017  . Encounter for long-term (current) use of high-risk medication 05/03/2017  . Anxiety 09/26/2016  . Substance abuse (HCC) 09/26/2016  . Human immunodeficiency virus (HIV) disease (HCC) 09/02/2014  . History of syphilis 08/25/2014    Past Surgical History:  Procedure Laterality Date  . hand surgery Right   . LUMBAR PUNCTURE  08/08/2019      . WRIST SURGERY         Family History  Adopted: Yes  Family history unknown: Yes    Social History   Tobacco Use  . Smoking status: Current Every Day Smoker    Types: Cigarettes  . Smokeless tobacco: Never Used  Vaping Use  . Vaping Use: Never used  Substance Use Topics  . Alcohol use: Yes    Comment: 1-2 a week   . Drug  use: Yes    Frequency: 14.0 times per week    Types: Marijuana, Methamphetamines    Home Medications Prior to Admission medications   Medication Sig Start Date End Date Taking? Authorizing Provider  bictegravir-emtricitabine-tenofovir AF (BIKTARVY) 50-200-25 MG TABS tablet Take 1 tablet by mouth daily. 07/16/20   Justin Barefoot, MD  FLUoxetine (PROZAC) 20 MG tablet Take 20 mg by mouth daily.    [provider]  HYDROcodone-acetaminophen (NORCO/VICODIN) 5-325 MG tablet Take 1 tablet by mouth every 6 (six) hours as needed for moderate pain.     [provider]  levETIRAcetam (KEPPRA) 500 MG tablet Take 1 tablet (500 mg total) by mouth 2 (two) times daily. 07/16/20   Justin Barefoot, MD  Melatonin 10 MG TABS Take 20 mg by mouth at bedtime.    [provider]    Allergies    Patient has no known allergies.  Review of Systems   Review of Systems  Unable to perform ROS: Psychiatric disorder    Physical Exam Updated Vital Signs There were no vitals taken for this visit.  Physical Exam Constitutional:      General: He is not in acute distress.    Appearance: Normal appearance. He is not ill-appearing, toxic-appearing or diaphoretic.  HENT:     Head: Normocephalic and atraumatic.  Cardiovascular:     Rate and Rhythm: Normal rate and regular rhythm.     Pulses: Normal pulses.  Pulmonary:     Effort: Pulmonary effort is normal.     Breath sounds: Normal breath sounds.  Musculoskeletal:        General: Normal range of motion.  Skin:    General: Skin is warm and dry.     Capillary Refill: Capillary refill takes less than 2 seconds.  Neurological:     Mental Status: He is alert.  Psychiatric:        Behavior: Behavior is agitated and aggressive.        Thought Content: Thought content is paranoid.     Comments: Patient is extremely rude, sometimes responding to auditory hallucinations.  Aggressive behavior, paranoid.  Disheveled.     Justin [name] Results / Procedures / Treatments   Labs (all labs ordered are listed, but only abnormal results are displayed) Labs Reviewed  RESP PANEL BY RT-PCR (FLU A&B, COVID) ARPGX2  CBC  COMPREHENSIVE METABOLIC PANEL  ETHANOL  RAPID URINE DRUG SCREEN, HOSP PERFORMED  SALICYLATE LEVEL  ACETAMINOPHEN LEVEL    EKG None  Radiology No results found.  Procedures Procedures   Medications Ordered in Justin [name] Medications - No data to display  Justin [name] Course  I have reviewed the triage vital signs and the nursing notes.  Pertinent labs & imaging results that were available  during my care of the patient were reviewed by me and considered in my medical decision making (see chart for details).    MDM Rules/Calculators/A&P                         KOKI BUXTON is a 40 y.o. male with past medical history of HIV, endocarditis, otitis C, syphilis, seizures, methamphetamine induced psychotic disorder, acute psychosis of presents the emergency department today for auditory hallucinations sent from [facility].  Patient was evaluated by FNP there who recommended further evaluation in Justin [name] after being medically cleared. On my exam patient is responding to internal stimuli and is extremely rude, will not let me perform physical exam.  States that he  just wants to sleep, will obtain basic labs at this time and reevaluate.   Pt care was handed off to [facility]  [place] 30 mins later.  Complete history and physical and current plan have been communicated.  Please refer to their Justin [name] for the remainder of Justin [name] care and ultimate disposition.  Awaiting labs for medical clearance.  Final Clinical Impression(s) / Justin [name] Diagnoses Final diagnoses:  Auditory hallucination    Rx / DC Orders Justin [name] Discharge Orders    None       Farrel Gordon, PA-C 08/13/20 1506    Benjiman Core, MD 08/13/20 1520

## 2020-08-13 NOTE — ED Notes (Signed)
Pt asked where the restroom was. Showed and attempted to provide pt with a specimen cup for a urine sample. However, pt shut the bathroom door in staff member's face and refused to provide a sample.

## 2020-08-13 NOTE — Progress Notes (Signed)
CSW and RN Durward Mallard contacted Peer support in regards to patient being back in the ED.

## 2020-08-13 NOTE — ED Provider Notes (Signed)
[facility] Medical Screening Exam  Patient Name: Justin [name] MRN: [id number] Date of Evaluation: 08/13/20 Chief Complaint:   Diagnosis:  Final diagnoses:  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HCC)    History of Present illness: Justin [name] is a 40 y.o. male. Patient presents voluntarily to the [facility] accompanied by law enforcement. Patient reports today that he was brought in because he has having hallucinations again. He reports that he was sober from methamphetamines for approximately 30 days and then relapsed about 4 days ago. He reports that he is using methamphetamines IV. He states that it is the drug that is the strongest enough that he doesn't have to uses much to get the same results. Patient states that he knows that he needs help and that that is why he is here. Patient continues to ramble various comments and thoughts that he is having about his entire family being dead and then the patient reports that then he saw his family and the conversation is disorganized and difficult to follow. In the middle of the conversation the patient stops and looks up towards the ceiling and yells "stop fucking talking to me, I'm trying to talk to him." Patient states that he needs to get straightened out because he used to be a functioning member of society and now he has gotten this point he doesn't know what to do next. He reports that he is shut down everyone around him and they're no longer there to be supportive of him. Patient also reports that he has not continued any of his medications including his Keppra and reports that he does have a seizure disorder. Patient states he has not taken any medications in approximately 2 to 3 days. Patient agrees to be transferred to the [facility] ED for medical stabilization as well as overnight observation and then will assist patient with outpatient resources. Patient does have a history of acute psychosis due to methamphetamine  use.  Psychiatric Specialty Exam  Presentation  General Appearance:Disheveled  Eye Contact:Good  Speech:Clear and Coherent; Pressured  Speech Volume:Normal  Handedness:Right   Mood and Affect  Mood:Anxious; Depressed; Labile  Affect:Congruent   Thought Process  Thought Processes:Linear  Descriptions of Associations:Tangential  Orientation:Full (Time, Place and Person)  Thought Content:Paranoid Ideation; Rumination; Tangential  Hallucinations:Auditory "voices"  Ideas of Reference:Paranoia  Suicidal Thoughts:No With Intent; With Plan; With Means to Carry Out Without Intent; Without Plan  Homicidal Thoughts:No Without Intent; Without Plan   Sensorium  Memory:Immediate Fair; Recent Fair; Remote Fair  Judgment:Impaired  Insight:Poor   Executive Functions  Concentration:Fair  Attention Span:Fair  Recall:Fair  Fund of Knowledge:Fair  Language:Good   Psychomotor Activity  Psychomotor Activity:Increased   Assets  Assets:Communication Skills; Desire for Improvement   Sleep  Sleep:Poor  Number of hours: 0 (reports no sleep for x3 days)   Physical Exam: Physical Exam Vitals and nursing note reviewed.  Constitutional:      Appearance: He is well-developed.  HENT:     Head: Normocephalic.  Eyes:     Pupils: Pupils are equal, round, and reactive to light.  Cardiovascular:     Rate and Rhythm: Normal rate.  Pulmonary:     Effort: Pulmonary effort is normal.  Musculoskeletal:        General: Normal range of motion.  Neurological:     Mental Status: He is alert and oriented to person, place, and time.  Psychiatric:        Attention and Perception: He perceives auditory hallucinations.  Thought Content: Thought content is paranoid.    Review of Systems  Constitutional: Negative.   HENT: Negative.   Eyes: Negative.   Respiratory: Negative.   Cardiovascular: Negative.   Gastrointestinal: Negative.   Genitourinary: Negative.    Musculoskeletal: Negative.   Skin: Negative.   Neurological: Negative.   Endo/Heme/Allergies: Negative.   Psychiatric/Behavioral: Positive for hallucinations and substance abuse.   Blood pressure 126/85, pulse 80, temperature 98.6 F (37 C), temperature source Oral, resp. rate 20, SpO2 100 %. There is no height or weight on file to calculate BMI.  Musculoskeletal: Strength & Muscle Tone: within normal limits Gait & Station: normal Patient leans: N/A   [facility] MSE Discharge Disposition for Follow up and Recommendations: Based on my evaluation the patient does not appear to have an emergency medical condition and can be discharged with resources and follow up care in outpatient services for Substance Abuse Intensive Outpatient Program  Patient will need to be transferred to [facility] for observation as he is not appropriate for the [facility]. Patient will need medical clearance once arriving to the ED. Patient has history of seizure disorder and needs to be restarted on his Keppra. Patient given Zyprexa 15 mg PO Once prior to leaving [employer], FNP 08/13/2020, 11:44 AM

## 2020-08-13 NOTE — ED Provider Notes (Signed)
Patient was received at shift change from [facility] she provided HPI, current work-up, likely disposition.  In short patient with significant medical history of HIV, endocarditis, hep C, syphilis, seizures, psychosis presents presents from  [facility] he was seen by [name] NP noted to be hallucinating using profanity and  became aggressive, it was recommended that he comes to the emergency department for overnight observation for psychosis. He will be reassessed in the morning.  Prior provider ordered med clearance lab work.  Physical Exam  BP 100/71 (BP Location: Right Arm)   Pulse 78   Temp 97.7 F (36.5 C) (Oral)   Resp 15   SpO2 98%   Physical Exam Vitals and nursing note reviewed.  Constitutional:      General: He is not in acute distress.    Appearance: He is not ill-appearing.  HENT:     Head: Normocephalic and atraumatic.     Nose: No congestion.  Eyes:     Conjunctiva/sclera: Conjunctivae normal.  Cardiovascular:     Rate and Rhythm: Normal rate and regular rhythm.  Pulmonary:     Effort: Pulmonary effort is normal.  Musculoskeletal:     Comments: Patient is moving all 4 extremities at difficulty.  Skin:    General: Skin is warm and dry.  Neurological:     Mental Status: He is alert.     Comments: Patient is having no difficulty with word finding.  Psychiatric:        Mood and Affect: Mood normal.     ED Course/Procedures     Procedures  MDM  Nursing staff notified me that patient is becoming more aggressive, throwing things around. assessed the patient, was able to de-escalate the situation, will provide patient with 20 IM Geodon as she still appears to be agitated.  CBC unremarkable, CMP shows slight hyperglycemia of 102, slight increase in T bili of 2.1, no gap present.  Ethanol less than 10, salicylate level less than 7, acetaminophen less than 10.  EKG sinus without signs of ischemia no ST elevation or depression noted.  Will place patient under IVC at  this time as he is becoming more agitated, through the wet floor sign, verbally assaulting nursing staff. he is a danger to himself and others.  Home meds will be ordered, patient will be placed under psych hold and will wait TTS's reevaluation tomorrow morning.       Carroll Sage, PA-C 08/13/20 2312    Tegeler, Canary Brim, MD 08/13/20 [phone]
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
## 2020-08-13 NOTE — ED Notes (Signed)
Pt refused to remove jeans. Pt has on purple scrubs under same. Belongings placed at NS in blue in large cardboard box.

## 2020-08-13 NOTE — ED Triage Notes (Signed)
Patient complains of auditory hallucinations for the last week. Patient is very somnolent and requires repeat stimulation to maintain attention.

## 2020-08-13 NOTE — Discharge Instructions (Addendum)
Discharge to [place] ED 

## 2020-08-13 NOTE — ED Notes (Signed)
Pt reports that he has been going through a lot. States that he has been having family members die, people are after him for no reason. States that he has not taken his medications in four days.

## 2020-08-13 NOTE — ED Notes (Signed)
Pt requested something to eat. Started yelling and knocked over the floor sign. Security called to bedside. Pt was given a [place] sandwich, cup of applesauce, and a cup of ginger ale. Provider notified of behavior. New orders received and advised that pt will be IVC'd.

## 2020-08-14 ENCOUNTER — Telehealth: Payer: Self-pay

## 2020-08-14 LAB — URINE CYTOLOGY ANCILLARY ONLY
Chlamydia: NEGATIVE
Comment: NEGATIVE
Comment: NORMAL
Neisseria Gonorrhea: NEGATIVE

## 2020-08-14 LAB — RAPID URINE DRUG SCREEN, HOSP PERFORMED
Amphetamines: POSITIVE — AB
Barbiturates: NOT DETECTED
Benzodiazepines: NOT DETECTED
Cocaine: NOT DETECTED
Opiates: NOT DETECTED
Tetrahydrocannabinol: NOT DETECTED

## 2020-08-14 LAB — T-HELPER CELL (CD4) - (RCID CLINIC ONLY)
CD4 % Helper T Cell: 26 % — ABNORMAL LOW (ref 33–65)
CD4 T Cell Abs: 554 /uL (ref 400–1790)

## 2020-08-14 MED ORDER — ZIPRASIDONE MESYLATE 20 MG IM SOLR
20.0000 mg | Freq: Once | INTRAMUSCULAR | Status: DC
  Filled 2020-08-14: qty 20

## 2020-08-14 MED ORDER — LORAZEPAM 1 MG PO TABS
1.0000 mg | ORAL_TABLET | Freq: Four times a day (QID) | ORAL | Status: DC | PRN
  Administered 2020-08-15: 1 mg via ORAL
  Filled 2020-08-14: qty 1

## 2020-08-14 MED ORDER — PENICILLIN G BENZATHINE 1200000 UNIT/2ML IM SUSP
2.4000 10*6.[IU] | Freq: Once | INTRAMUSCULAR | Status: AC
  Administered 2020-08-14: 2.4 10*6.[IU] via INTRAMUSCULAR
  Filled 2020-08-14 (×2): qty 4

## 2020-08-14 MED ORDER — STERILE WATER FOR INJECTION IJ SOLN
INTRAMUSCULAR | Status: AC
  Filled 2020-08-14: qty 10

## 2020-08-14 MED ORDER — NICOTINE 21 MG/24HR TD PT24
21.0000 mg | MEDICATED_PATCH | Freq: Every day | TRANSDERMAL | Status: DC
  Administered 2020-08-14 – 2020-08-15 (×2): 21 mg via TRANSDERMAL
  Filled 2020-08-14 (×2): qty 1

## 2020-08-14 MED ORDER — LORAZEPAM 1 MG PO TABS
2.0000 mg | ORAL_TABLET | Freq: Once | ORAL | Status: AC
  Administered 2020-08-14: 2 mg via ORAL
  Filled 2020-08-14: qty 2

## 2020-08-14 NOTE — ED Notes (Signed)
Patient was given Snack and Drink. 

## 2020-08-14 NOTE — ED Notes (Signed)
Pt is awake, calm and cooperative. Given sandwich bag and drink. States that he is hearing voices and that he knows that we are saying that he has given HIV to other people. Assured pt that no one is saying that he has given HIV to anyone. Pt states that he thinks that his family is all dead but he spoke with them yesterday.

## 2020-08-14 NOTE — Telephone Encounter (Signed)
-----   Message from [name], MD sent at 08/14/2020  4:16 PM EST ----- His syphilis is positive with an increased titer.  He needs Bicillin 2.4 million units x 1 week. thanks

## 2020-08-14 NOTE — ED Notes (Signed)
IVC papers faxed to [facility]. Copy of IVC papers made and placed in Medical records.

## 2020-08-14 NOTE — ED Provider Notes (Signed)
Emergency Medicine Observation Re-evaluation Note  Justin [name] is a 40 y.o. male, seen on rounds today.  Pt initially presented to the ED for complaints of Medical Clearance Currently, the patient is sleeping comfortably.  No signs of distress.  This is a 40 year old male with past medical history of HIV, endocarditis, hep C, syphilis, seizures, psychosis presents from [facility] for concern of hallucinations, aggressive behavior.  He was recommended to be observed in the ED overnight for observation for psychosis.  Physical Exam  BP (!) 103/57 (BP Location: Right Arm)   Pulse 83   Temp 98.9 F (37.2 C) (Oral)   Resp 16   SpO2 96%  Physical Exam General: Resting comfortably.  No signs of distress. Cardiac: RRR Lungs: CTAB Psych: Aggressive  ED Course / MDM  EKG:EKG Interpretation  Date/Time:  Thursday August 13 2020 16:26:47 EST Ventricular Rate:  62 PR Interval:  140 QRS Duration: 92 QT Interval:  418 QTC Calculation: 424 R Axis:   14 Text Interpretation: Normal sinus rhythm Incomplete right bundle branch block Borderline ECG When compared to prior, similar apperance. No STEMI Confirmed by Theda Belfast ([id number]) on 08/13/2020 6:24:44 PM    I have reviewed the labs performed to date as well as medications administered while in observation.   Plan    Current plan is for TTS evaluation.  Given his aggressive behavior yesterday, IVC was initiated.  He was going to be reassessed by TTS today.  TTS consult has been placed. Patient is under full IVC at this time.  Patient became aggressive, yelling at staff.  He tried to leave the room.  Security and police came.  Patient was yelling, threatening staff.  Geodon was ordered.  I was messaged by the nurse at behavioral health urgent care who states that patient's RPR came back elevated a.  She requested I treat him.  I reviewed his records and I did see that his RPR was elevated.  Penicillin ordered for patient.  Portions of this  note were generated with Scientist, clinical (histocompatibility and immunogenetics). Dictation errors may occur despite best attempts at proofreading.    Maxwell Caul, PA-C 08/14/20 1712    Terald Sleeper, MD 08/14/20 [phone]

## 2020-08-14 NOTE — ED Notes (Signed)
Lunch Tray Ordered @ 1030. 

## 2020-08-14 NOTE — ED Notes (Signed)
Breakfast ordered 

## 2020-08-14 NOTE — ED Notes (Signed)
Pt moved to purple zone. Woke pt up and explained to pt where he is. Pt states understanding. This RN knows this pt very well. Pt very sleepy. Asked pt if he wanted to move over to a bed and pt refused. States he wants to remain on stretcher. Will ask him in the morning if he would like the bed. Pt is an IVC pt but has no sitter at this time due to no one available. Charge RN aware. Will continue to monitor pt.
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
## 2020-08-14 NOTE — ED Notes (Signed)
Dinner tray provided

## 2020-08-14 NOTE — ED Notes (Signed)
Dinner Tray Ordered @ 1710. 

## 2020-08-14 NOTE — ED Notes (Addendum)
Pt asking for graham crackers, told pt he is allowed 2 snacks per day and he has had more than that already. Pt starts yelling, smacking the desk. Walked toward the front door. Security and [employer] told him to go back to his room. Pt yelling, swearing at staff. [employer] gave pt graham crackers after he agreed to go back to his room, pt back in room.

## 2020-08-14 NOTE — Telephone Encounter (Signed)
Messaged ED provider to see if patient can receive treatment in the ED while he's there.   Raford Brissett Loyola Mast, RN

## 2020-08-15 DIAGNOSIS — F1594 Other stimulant use, unspecified with stimulant-induced mood disorder: Secondary | ICD-10-CM

## 2020-08-15 NOTE — ED Notes (Signed)
Pt.pulled a kitchen knife out of personal belongings while I was in his room. Slammed it on the table. I stepped out when I saw it and notified the nursed .

## 2020-08-15 NOTE — ED Notes (Signed)
Pt was asked if he was ready to leave & staff to assist him with his belongings out for D/C. He said "no" and then tech went into the room to start cleaning the room & pt pulled a knife out of his belongings and laid it on the counter. [employer] was told & instantly came to bedside with security for safety. Pt arguing with [employer] and gathering his belongings at his leisure.

## 2020-08-15 NOTE — Progress Notes (Signed)
CSW contacted by RN regarding behavioral health requested outpatient resources provided to patient. CSW gathered resources and reviewed them with patient. CSW noted patient was under the impression he was going inpatient. CSW informed him the behavioral health team will likely have to follow up with him on his questions regarding this. CSW noted patient did accept resources at this time and discussed briefly following-up with [facility] or [facility].

## 2020-08-15 NOTE — ED Notes (Addendum)
Pt has been given all outpatient resources as requested, D/C papers given & bus pass, pt has been given his belongings. He is changing clothes now & tech will get V/S.

## 2020-08-15 NOTE — Discharge Instructions (Addendum)
Please follow-up with your psychiatrist and your primary care doctor.  Return if you have worsening hallucinations, thoughts of hurting yourself or others.

## 2020-08-15 NOTE — BHH Suicide Risk Assessment (Cosign Needed)
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 He was transferred to [facility] ED for continued observation.  Patient seen via telepsych by this provider; chart reviewed and consulted with Dr. Lucianne Muss on 08/15/20.  On evaluation Justin [name] reports a history for methampehthamine abuse, states he was sober for one month starting in January and was compliant with Fluoxetine 20mg  daily which helped his depressive symptoms.   Historically relapsed 5 days ago, after being triggered by the death of his brother, and family feuding.  States he also stopped using his all medical and mental health medications.  Patient has a history of HIV and seizures and takes daily Biktarvy  and Keppra respectively.   Since then he started having audible hallucinations telling him to hurt himself. On admission, his UDS was + for amphetamines.  He was restarted on his home medications and given geodon 20mg  IM for hallucinations and lorazepam 2mg  tab po for one dose for anxiety. Today, he is clear and coherent and able to provide HPI to this .  States the Geodon and Lorazepam helped him sleep, he still has suicidal ideations but no clear plan or intent of self harm.  Per review of record and patient admission, chronic suicidal ideations are pretty consistent with progress notes from previous visits, usually clears up with, sobriety and taking fluoxetine as ordered.   States his main goal today is to seek resources for inpatient/outpatient substance abuse treatment.    Justin [name] is known to our facility for  similar presentation, seen 14x within the past 6 months.  He is usually stabilized and discharged home to follow-up with outpatient management.  He is followed by [facility], infectious disease for concurrent mgmt of HIV and depression. Of note, he states he has never received inpatient SUD tx.     During evaluation Justin [name] is seated in an upright position on the hospital bed. He is alert/oriented x 4; calm/cooperative; and mood congruent with affect.  Patient is speaking in a clear tone at moderate volume, and normal pace; with good eye contact.  His thought process is coherent and relevant; There is no indication that he is currently responding to internal/external stimuli or experiencing delusional thought content.  Patient endorses suicidal but no intent or plan. He denies homicidal ideation and paranoia.  Patient has remained calm throughout assessment and has answered questions appropriately.    Total Time spent with patient: 30 minutes  Musculoskeletal: Strength & Muscle Tone: within normal limits Gait & Station: normal Patient leans: N/A  Psychiatric Specialty Exam: @ROSBYAGE @  Blood pressure 111/73, pulse 79, temperature 97.8 F (36.6 C), temperature source Oral, resp. rate 18, SpO2 97 %.There is no height or weight on file to calculate BMI.  General Appearance: Casual  Eye Contact::  Good  Speech:  Clear and Coherent and Normal Rate409  Volume:  Normal  Mood:  Depressed  Affect:  Congruent  Thought Process:  Coherent and Goal Directed  Orientation:  Full (Time, Place, and Person)  Thought Content:  Logical and Hallucinations: Auditory  Suicidal Thoughts:  Yes.  without intent/plan  Homicidal Thoughts:  No  Memory:  Immediate;  Good Recent;   Good Remote;   Good  Judgement:  Fair  Insight:  Fair  Psychomotor Activity:  Normal  Concentration:  Good  Recall:  Good  Fund of Knowledge:Fair  Language: Good  Akathisia:  Negative  Handed:  Right  AIMS  (if indicated):     Assets:  Communication Skills Resilience  Sleep:     Cognition: WNL  ADL's:  Intact   Mental Status Per Nursing Assessment::   On Admission:     Demographic Factors:  Male and Low socioeconomic status  Loss Factors: NA  Historical Factors: Prior suicide attempts and Impulsivity  Risk Reduction Factors:   Sense of responsibility to family  Continued Clinical Symptoms:  Depression:   Comorbid alcohol abuse/dependence Alcohol/Substance Abuse/Dependencies  Cognitive Features That Contribute To Risk:  Closed-mindedness    Suicide Risk:  Mild:  Suicidal ideation of limited frequency, intensity, duration, and specificity.  There are no identifiable plans, no associated intent, mild dysphoria and related symptoms, good self-control (both objective and subjective assessment), few other risk factors, and identifiable protective factors, including available and accessible social support.    Plan Of Care/Follow-up recommendations:  Plan- As per above assessment, there are no current grounds for involuntary commitment at this time.?  Patient is not currently interested in inpatient psychiatric services, but expresses agreement to continue SUD inpatient/outpatient treatment - we have reviewed importance of substance abuse abstinence, potential negative impact substance abuse can have on his relationships and level of functioning, and importance of medication compliance. ?   Peer Support Consult entered to discuss SUD inpatient/outpatient referrals. Asking EDP to rescind IVC at this time.   Disposition: No evidence of imminent risk to self or others at present.   Patient does not meet criteria for psychiatric inpatient admission. Supportive therapy provided about ongoing stressors. Discussed crisis plan, support from social network, calling 911, coming to the Emergency Department, and calling Suicide Hotline.   Spoke with Dr. Stevie Kern, EDP and RN Roena Malady, informed  via Epic messaging of above recommendation and disposition  Chales Abrahams, NP 08/15/2020, 10:46 AM

## 2020-08-15 NOTE — ED Notes (Signed)
Spoke with patient this morning and he wants it noted that he would like inpt tx due to increased AH telling him to kill himself; pt states he does not listen to the voices but they are getting worse and feels he will benefit from inptx rather than put back on the streets; pt is in good spirits this morning and pleasant with staff; Pt has no document assessment or plan from yesterday; Patient will need to be reassessed today for plan of care-Monique,RN

## 2020-08-15 NOTE — ED Notes (Signed)
IVC papers have been rescinded by Stevie Kern, MD. Social work has been contacted for peer support resources at psych's request for pt at D/C.

## 2020-08-15 NOTE — ED Notes (Addendum)
[employer] has escorted pt off of the premises with his belongings except the knife. The blade is approx. the same length as the black tv remote with blue buttons in the drawer at the purple zone desk. Security has came and confiscated the knife from where it was laid at the nurse desk when [employer] initially took it from the pt.

## 2020-08-16 ENCOUNTER — Emergency Department (HOSPITAL_COMMUNITY)
Admission: EM | Admit: 2020-08-16 | Discharge: 2020-08-16 | Disposition: A | Discharge status: Home / Self Care | Payer: Self-pay | Attending: Emergency Medicine | Admitting: Emergency Medicine

## 2020-08-16 ENCOUNTER — Encounter (HOSPITAL_COMMUNITY): Payer: Self-pay | Admitting: Emergency Medicine

## 2020-08-16 ENCOUNTER — Other Ambulatory Visit: Payer: Self-pay

## 2020-08-16 ENCOUNTER — Emergency Department (HOSPITAL_COMMUNITY): Payer: Self-pay

## 2020-08-16 DIAGNOSIS — T40601A Poisoning by unspecified narcotics, accidental (unintentional), initial encounter: Secondary | ICD-10-CM

## 2020-08-16 DIAGNOSIS — F17210 Nicotine dependence, cigarettes, uncomplicated: Secondary | ICD-10-CM | POA: Insufficient documentation

## 2020-08-16 DIAGNOSIS — Z21 Asymptomatic human immunodeficiency virus [HIV] infection status: Secondary | ICD-10-CM | POA: Insufficient documentation

## 2020-08-16 DIAGNOSIS — T402X1A Poisoning by other opioids, accidental (unintentional), initial encounter: Secondary | ICD-10-CM | POA: Insufficient documentation

## 2020-08-16 DIAGNOSIS — R000 Tachycardia, unspecified: Secondary | ICD-10-CM | POA: Insufficient documentation

## 2020-08-16 DIAGNOSIS — R0689 Other abnormalities of breathing: Secondary | ICD-10-CM | POA: Insufficient documentation

## 2020-08-16 LAB — RAPID URINE DRUG SCREEN, HOSP PERFORMED
Amphetamines: POSITIVE — AB
Barbiturates: NOT DETECTED
Benzodiazepines: POSITIVE — AB
Cocaine: NOT DETECTED
Opiates: NOT DETECTED
Tetrahydrocannabinol: POSITIVE — AB

## 2020-08-16 LAB — CBC WITH DIFFERENTIAL/PLATELET
Abs Immature Granulocytes: 0.15 10*3/uL — ABNORMAL HIGH (ref 0.00–0.07)
Basophils Absolute: 0 10*3/uL (ref 0.0–0.1)
Basophils Relative: 0 %
Eosinophils Absolute: 0 10*3/uL (ref 0.0–0.5)
Eosinophils Relative: 0 %
HCT: 48.7 % (ref 39.0–52.0)
Hemoglobin: 15.3 g/dL (ref 13.0–17.0)
Immature Granulocytes: 1 %
Lymphocytes Relative: 16 %
Lymphs Abs: 2.3 10*3/uL (ref 0.7–4.0)
MCH: 32.6 pg (ref 26.0–34.0)
MCHC: 31.4 g/dL (ref 30.0–36.0)
MCV: 103.6 fL — ABNORMAL HIGH (ref 80.0–100.0)
Monocytes Absolute: 1.1 10*3/uL — ABNORMAL HIGH (ref 0.1–1.0)
Monocytes Relative: 8 %
Neutro Abs: 10.5 10*3/uL — ABNORMAL HIGH (ref 1.7–7.7)
Neutrophils Relative %: 75 %
Platelets: 258 10*3/uL (ref 150–400)
RBC: 4.7 MIL/uL (ref 4.22–5.81)
RDW: 11.9 % (ref 11.5–15.5)
WBC: 14 10*3/uL — ABNORMAL HIGH (ref 4.0–10.5)
nRBC: 0 % (ref 0.0–0.2)

## 2020-08-16 LAB — COMPREHENSIVE METABOLIC PANEL
ALT: 25 U/L (ref 0–44)
AST: 28 U/L (ref 15–41)
Albumin: 4.8 g/dL (ref 3.5–5.0)
Alkaline Phosphatase: 96 U/L (ref 38–126)
Anion gap: 16 — ABNORMAL HIGH (ref 5–15)
BUN: 14 mg/dL (ref 6–20)
CO2: 22 mmol/L (ref 22–32)
Calcium: 9.5 mg/dL (ref 8.9–10.3)
Chloride: 101 mmol/L (ref 98–111)
Creatinine, Ser: 1.63 mg/dL — ABNORMAL HIGH (ref 0.61–1.24)
GFR, Estimated: 55 mL/min — ABNORMAL LOW (ref >60)
Glucose, Bld: 139 mg/dL — ABNORMAL HIGH (ref 70–99)
Potassium: 4.3 mmol/L (ref 3.5–5.1)
Sodium: 139 mmol/L (ref 135–145)
Total Bilirubin: 1 mg/dL (ref 0.3–1.2)
Total Protein: 8.1 g/dL (ref 6.5–8.1)

## 2020-08-16 LAB — I-STAT CHEM 8, ED
BUN: 15 mg/dL (ref 6–20)
Calcium, Ion: 1.19 mmol/L (ref 1.15–1.40)
Chloride: 103 mmol/L (ref 98–111)
Creatinine, Ser: 1.5 mg/dL — ABNORMAL HIGH (ref 0.61–1.24)
Glucose, Bld: 85 mg/dL (ref 70–99)
HCT: 44 % (ref 39.0–52.0)
Hemoglobin: 15 g/dL (ref 13.0–17.0)
Potassium: 4.1 mmol/L (ref 3.5–5.1)
Sodium: 140 mmol/L (ref 135–145)
TCO2: 26 mmol/L (ref 22–32)

## 2020-08-16 LAB — LACTIC ACID, PLASMA: Lactic Acid, Venous: 7 mmol/L (ref 0.5–1.9)

## 2020-08-16 LAB — ETHANOL: Alcohol, Ethyl (B): <10 mg/dL

## 2020-08-16 LAB — CK: Total CK: 174 U/L (ref 49–397)

## 2020-08-16 LAB — SALICYLATE LEVEL: Salicylate Lvl: <7 mg/dL — ABNORMAL LOW (ref 7.0–30.0)

## 2020-08-16 LAB — CBG MONITORING, ED: Glucose-Capillary: 114 mg/dL — ABNORMAL HIGH (ref 70–99)

## 2020-08-16 LAB — ACETAMINOPHEN LEVEL: Acetaminophen (Tylenol), Serum: <10 ug/mL — ABNORMAL LOW (ref 10–30)

## 2020-08-16 MED ORDER — SODIUM CHLORIDE 0.9 % IV BOLUS
1000.0000 mL | Freq: Once | INTRAVENOUS | Status: AC
  Administered 2020-08-16: 1000 mL via INTRAVENOUS

## 2020-08-16 MED ORDER — NALOXONE HCL 2 MG/2ML IJ SOSY
PREFILLED_SYRINGE | INTRAMUSCULAR | Status: AC
  Filled 2020-08-16: qty 2

## 2020-08-16 NOTE — ED Provider Notes (Signed)
[facility]-EMERGENCY DEPT Provider Note   CSN: [id number] Arrival date & time: 08/16/20  2992     History Chief Complaint  Patient presents with  . Altered Mental Status    Justin [name] is a 40 y.o. male.  40 yo M just discharged from [facility] arrived here after being found running the streets without his clothes on barefoot.  Found to be agitated and diaphoretic.  Given 5 mg of Versed IM with complete unresponsiveness.  Unable to intubate in the field and brought to the ED for evaluation.  Level five caveat altered mental status.  The history is provided by the patient and the EMS personnel.  Altered Mental Status      Past Medical History:  Diagnosis Date  . Endocarditis   . Hepatitis C   . History of syphilis 08/25/2014   Treated by [facility] 07-2014.  [name] HIV (human immunodeficiency virus infection) (HCC)   . Seizures [facility])     Patient Active Problem List   Diagnosis Date Noted  . Suicidal ideations 07/08/2020  . 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HCC)   . Seizure (HCC) 06/26/2020  . Seizure-like activity (HCC) 06/25/2020  . Amphetamine and psychostimulant-induced psychosis with delusions (HCC)   . 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 Acute psychosis (HCC)   . Substance use disorder 08/07/2019  . Encephalopathy 08/06/2019  . Chronic hepatitis C without hepatic coma (HCC) 08/17/2017  . Screening examination for venereal disease 05/03/2017  . Encounter for long-term (current) use of high-risk medication 05/03/2017  . Anxiety 09/26/2016  . Substance abuse (HCC) 09/26/2016  . Human immunodeficiency virus (HIV) disease (HCC) 09/02/2014  . History of syphilis 08/25/2014    Past Surgical History:  Procedure Laterality Date  . hand surgery Right   . LUMBAR PUNCTURE  08/08/2019      . WRIST SURGERY         Family History  Adopted: Yes  Family history unknown:  Yes    Social History   Tobacco Use  . Smoking status: Current Every Day Smoker    Types: Cigarettes  . Smokeless tobacco: Never Used  Vaping Use  . Vaping Use: Never used  Substance Use Topics  . Alcohol use: Yes    Comment: 1-2 a week   . Drug use: Yes    Frequency: 14.0 times per week    Types: Marijuana, Methamphetamines    Home Medications Prior to Admission medications   Medication Sig Start Date End Date Taking? Authorizing Provider  bictegravir-emtricitabine-tenofovir AF (BIKTARVY) 50-200-25 MG TABS tablet Take 1 tablet by mouth daily. 07/16/20   [name], MD  FLUoxetine (PROZAC) 20 MG tablet Take 20 mg by mouth daily. Patient not taking: No sig reported    [provider]  ibuprofen (ADVIL) 800 MG tablet Take 800 mg by mouth 2 (two) times daily as needed for headache (pain).    [provider]  levETIRAcetam (KEPPRA) 500 MG tablet Take 1 tablet (500 mg total) by mouth 2 (two) times daily. 07/16/20   [name], MD  MELATONIN PO Take 2 tablets by mouth at bedtime.    [provider]    Allergies    Patient has no known allergies.  Review of Systems   Review of Systems  Unable to perform ROS: Mental status change    Physical Exam Updated Vital Signs BP (!) 137/92   Pulse (!) 112   Temp 97.6  F (36.4 C) (Oral)   Resp (!) 24   SpO2 95%   Physical Exam Vitals and nursing note reviewed.  Constitutional:      Appearance: He is well-developed and well-nourished. He is diaphoretic.  HENT:     Head: Normocephalic and atraumatic.  Eyes:     Extraocular Movements: EOM normal.     Comments: Pinpoint pupils  Neck:     Vascular: No JVD.  Cardiovascular:     Rate and Rhythm: Regular rhythm. Tachycardia present.     Heart sounds: No murmur heard. No friction rub. No gallop.   Pulmonary:     Effort: Bradypnea present. No respiratory distress.     Breath sounds: No wheezing.  Abdominal:     General: There is no distension.      Tenderness: There is no abdominal tenderness. There is no guarding or rebound.  Musculoskeletal:        General: Normal range of motion.     Cervical back: Normal range of motion and neck supple.  Skin:    Coloration: Skin is not pale.     Findings: No rash.  Neurological:     Comments: GCS 3  Psychiatric:        Mood and Affect: Mood and affect normal.     Comments: Unresponsive, unable to assess mood or affect     ED Results / Procedures / Treatments   Labs (all labs ordered are listed, but only abnormal results are displayed) Labs Reviewed  CBC WITH DIFFERENTIAL/PLATELET - Abnormal; Notable for the following components:      Result Value   WBC 14.0 (*)    MCV 103.6 (*)    Neutro Abs 10.5 (*)    Monocytes Absolute 1.1 (*)    Abs Immature Granulocytes 0.15 (*)    All other components within normal limits  COMPREHENSIVE METABOLIC PANEL - Abnormal; Notable for the following components:   Glucose, Bld 139 (*)    Creatinine, Ser 1.63 (*)    GFR, Estimated 55 (*)    Anion gap 16 (*)    All other components within normal limits  RAPID URINE DRUG SCREEN, HOSP PERFORMED - Abnormal; Notable for the following components:   Benzodiazepines POSITIVE (*)    Amphetamines POSITIVE (*)    Tetrahydrocannabinol POSITIVE (*)    All other components within normal limits  LACTIC ACID, PLASMA - Abnormal; Notable for the following components:   Lactic Acid, Venous 7.0 (*)    All other components within normal limits  ACETAMINOPHEN LEVEL - Abnormal; Notable for the following components:   Acetaminophen (Tylenol), Serum <10 (*)    All other components within normal limits  SALICYLATE LEVEL - Abnormal; Notable for the following components:   Salicylate Lvl <7.0 (*)    All other components within normal limits  I-STAT CHEM 8, ED - Abnormal; Notable for the following components:   Creatinine, Ser 1.50 (*)    All other components within normal limits  CBG MONITORING, ED - Abnormal; Notable  for the following components:   Glucose-Capillary 114 (*)    All other components within normal limits  ETHANOL  CK  LACTIC ACID, PLASMA    EKG None  Radiology DG Chest Port 1 View  Result Date: 08/16/2020 CLINICAL DATA:  Altered mental status. EXAM: PORTABLE CHEST 1 VIEW COMPARISON:  08/07/2020 FINDINGS: Lower lung volumes from prior exam. Ill-defined bibasilar opacities. Unchanged heart size and mediastinal contours. No pulmonary edema. No pleural fluid. No pneumothorax. No acute osseous  abnormalities are seen. IMPRESSION: Ill-defined bibasilar opacities, atelectasis versus pneumonia. Electronically Signed   By: Narda Rutherford M.D.   On: 08/16/2020 03:19    Procedures Procedures   Medications Ordered in ED Medications  naloxone [facility]) 2 MG/2ML injection (  Given 08/16/20 0255)  sodium chloride 0.9 % bolus 1,000 mL (1,000 mLs Intravenous New Bag/Given 08/16/20 0256)    ED Course  I have reviewed the triage vital signs and the nursing notes.  Pertinent labs & imaging results that were available during my care of the patient were reviewed by me and considered in my medical decision making (see chart for details).    MDM Rules/Calculators/A&P                          40 yo M arrived with acute agitation.  Given 5 mg of Versed with resultant unresponsiveness.  Patient was given Narcan with increased respiratory rate and mental status.  Will observe in the ED.  Blood work.  CRITICAL CARE Performed by: Rae Roam   Total critical care time: 35 minutes  Critical care time was exclusive of separately billable procedures and treating other patients.  Critical care was necessary to treat or prevent imminent or life-threatening deterioration.  Critical care was time spent personally by me on the following activities: development of treatment plan with patient and/or surrogate as well as nursing, discussions with consultants, evaluation of patient's response to  treatment, examination of patient, obtaining history from patient or surrogate, ordering and performing treatments and interventions, ordering and review of laboratory studies, ordering and review of radiographic studies, pulse oximetry and re-evaluation of patient's condition.  The patient is noted to have a lactate>4. With the current information available to me, I don't think the patient is in septic shock. The lactate>4, is related to drug / toxin overdose.   Patient is now alert and without significant complaints.  He is not sure exactly what happened last night.  I described the events that were told to me by EMS and from his arrival to now.  Denies any opiate use.  Will discharge the patient home.  PCP follow-up.  6:21 AM:  I have discussed the diagnosis/risks/treatment options with the patient and believe the pt to be eligible for discharge home to follow-up with PCP. We also discussed returning to the ED immediately if new or worsening sx occur. We discussed the sx which are most concerning (e.g., sudden worsening pain, fever, inability to tolerate by mouth) that necessitate immediate return. Medications administered to the patient during their visit and any new prescriptions provided to the patient are listed below.  Medications given during this visit Medications  naloxone (NARCAN) 2 MG/2ML injection (  Given 08/16/20 0255)  sodium chloride 0.9 % bolus 1,000 mL (1,000 mLs Intravenous New Bag/Given 08/16/20 0256)     The patient appears reasonably screen and/or stabilized for discharge and I doubt any other medical condition or other [facility] requiring further screening, evaluation, or treatment in the ED at this time prior to discharge.    Final Clinical Impression(s) / ED Diagnoses Final diagnoses:  Opiate overdose, accidental or unintentional, initial encounter [facility])    Rx / DC Orders ED Discharge Orders    None       Melene Plan, DO 08/16/20 [phone]

## 2020-08-16 NOTE — ED Triage Notes (Addendum)
Pt BIB [facility]. States that they found him in the road with a golf club, running around, diaphoretic, and yelling for help. Not cooperative with EMS. [employer] accompanied.  5mg  Versed IM

## 2020-08-16 NOTE — Discharge Instructions (Addendum)
Follow up with your family doc.   There is help if you need it.  Please do not use dirty needles, this could cause you a severe infection to your skin, heart or spinal cord.  This could kill you or leave you permanently disabled.  There was a recent study done at [facility] that showed that the risk of death for someone that had unintentionally overdosed on narcotics was as high as 15% in the next year.  This is much higher than most every other medical condition.  [facility] Solution to the Opioid Problem (GCSTOP) Fixed; mobile; peer-based Chase Holleman [phone] [email or web address] Fixed site exchange at [facility], Wednesdays (2-5pm) and Thursdays (4-8pm). [address] [place], [place] [address] Call or text to arrange mobile and peer exchange, Mondays (1-4pm) and Fridays (4-7pm). Serving [facility] [email or web address]  Suboxone clinic: [facility] [address] [place], [address]  [facility] [address] [place], [address]  [facility] [address] [place] [address]

## 2020-08-16 NOTE — ED Notes (Signed)
CRITICAL VALUE ALERT  Critical Value:  Lactic Acid 7  Date & Time Notied:  08/16/20  Provider Notified: Melene Plan  Orders Received/Actions taken: Waiting for orders.

## 2020-08-17 LAB — COMPLETE METABOLIC PANEL WITH GFR
AG Ratio: 1.5 (calc) (ref 1.0–2.5)
ALT: 23 U/L (ref 9–46)
AST: 25 U/L (ref 10–40)
Albumin: 4.8 g/dL (ref 3.6–5.1)
Alkaline phosphatase (APISO): 85 U/L (ref 36–130)
BUN: 12 mg/dL (ref 7–25)
CO2: 31 mmol/L (ref 20–32)
Calcium: 10.4 mg/dL — ABNORMAL HIGH (ref 8.6–10.3)
Chloride: 103 mmol/L (ref 98–110)
Creat: 1.11 mg/dL (ref 0.60–1.35)
GFR, Est African American: 96 mL/min/{1.73_m2} (ref >=60)
GFR, Est Non African American: 83 mL/min/{1.73_m2} (ref >=60)
Globulin: 3.2 g/dL (calc) (ref 1.9–3.7)
Glucose, Bld: 92 mg/dL (ref 65–99)
Potassium: 4.3 mmol/L (ref 3.5–5.3)
Sodium: 143 mmol/L (ref 135–146)
Total Bilirubin: 2.1 mg/dL — ABNORMAL HIGH (ref 0.2–1.2)
Total Protein: 8 g/dL (ref 6.1–8.1)

## 2020-08-17 LAB — CBC WITH DIFFERENTIAL/PLATELET
Absolute Monocytes: 536 cells/uL (ref 200–950)
Basophils Absolute: 20 cells/uL (ref 0–200)
Basophils Relative: 0.4 %
Eosinophils Absolute: 41 cells/uL (ref 15–500)
Eosinophils Relative: 0.8 %
HCT: 47.2 % (ref 38.5–50.0)
Hemoglobin: 16.1 g/dL (ref 13.2–17.1)
Lymphs Abs: 2305 cells/uL (ref 850–3900)
MCH: 32.9 pg (ref 27.0–33.0)
MCHC: 34.1 g/dL (ref 32.0–36.0)
MCV: 96.3 fL (ref 80.0–100.0)
MPV: 10.1 fL (ref 7.5–12.5)
Monocytes Relative: 10.5 %
Neutro Abs: 2198 cells/uL (ref 1500–7800)
Neutrophils Relative %: 43.1 %
Platelets: 227 10*3/uL (ref 140–400)
RBC: 4.9 10*6/uL (ref 4.20–5.80)
RDW: 11.6 % (ref 11.0–15.0)
Total Lymphocyte: 45.2 %
WBC: 5.1 10*3/uL (ref 3.8–10.8)

## 2020-08-17 LAB — HIV-1 RNA QUANT-NO REFLEX-BLD
HIV 1 RNA Quant: <20 Copies/mL
HIV-1 RNA Quant, Log: <1.3 Log cps/mL

## 2020-08-17 LAB — HEPATITIS C RNA QUANTITATIVE
HCV Quantitative Log: 5.8 log IU/mL — ABNORMAL HIGH
HCV RNA, PCR, QN: 629000 IU/mL — ABNORMAL HIGH

## 2020-08-17 LAB — RPR: RPR Ser Ql: REACTIVE — AB

## 2020-08-17 LAB — RPR TITER: RPR Titer: 1:16 {titer} — ABNORMAL HIGH

## 2020-08-17 LAB — FLUORESCENT TREPONEMAL AB(FTA)-IGG-BLD: Fluorescent Treponemal ABS: REACTIVE — AB

## 2020-08-18 ENCOUNTER — Telehealth: Payer: Self-pay

## 2020-08-18 NOTE — Telephone Encounter (Signed)
-----   Message from Odette Fraction, MD sent at 08/17/2020  4:01 PM EST ----- Will need one dose of benzathine penicillin 2.4 million units one dose.

## 2020-08-18 NOTE — Telephone Encounter (Signed)
Per chart, patient was treated in emergency department with 2.4 million units Bicillin IM x 1 on 08/14/20.   Sandie Ano, RN

## 2020-08-20 ENCOUNTER — Other Ambulatory Visit: Payer: Self-pay

## 2020-08-20 ENCOUNTER — Ambulatory Visit (HOSPITAL_COMMUNITY)
Admission: EM | Admit: 2020-08-20 | Discharge: 2020-08-21 | Disposition: A | Discharge status: Home / Self Care | Payer: No Payment, Other | Attending: Nurse Practitioner | Admitting: Nurse Practitioner

## 2020-08-20 ENCOUNTER — Encounter (HOSPITAL_COMMUNITY): Payer: Self-pay | Admitting: Emergency Medicine

## 2020-08-20 DIAGNOSIS — F1994 Other psychoactive substance use, unspecified with psychoactive substance-induced mood disorder: Secondary | ICD-10-CM | POA: Diagnosis not present

## 2020-08-20 DIAGNOSIS — Z8619 Personal history of other infectious and parasitic diseases: Secondary | ICD-10-CM | POA: Insufficient documentation

## 2020-08-20 DIAGNOSIS — F17210 Nicotine dependence, cigarettes, uncomplicated: Secondary | ICD-10-CM | POA: Insufficient documentation

## 2020-08-20 DIAGNOSIS — Z20822 Contact with and (suspected) exposure to covid-19: Secondary | ICD-10-CM | POA: Insufficient documentation

## 2020-08-20 DIAGNOSIS — Z21 Asymptomatic human immunodeficiency virus [HIV] infection status: Secondary | ICD-10-CM | POA: Insufficient documentation

## 2020-08-20 DIAGNOSIS — Z79899 Other long term (current) drug therapy: Secondary | ICD-10-CM | POA: Insufficient documentation

## 2020-08-20 DIAGNOSIS — R45851 Suicidal ideations: Secondary | ICD-10-CM | POA: Diagnosis not present

## 2020-08-20 LAB — RESP PANEL BY RT-PCR (FLU A&B, COVID) ARPGX2
Influenza A by PCR: NEGATIVE
Influenza B by PCR: NEGATIVE
SARS Coronavirus 2 by RT PCR: NEGATIVE

## 2020-08-20 LAB — POC SARS CORONAVIRUS 2 AG -  ED: SARS Coronavirus 2 Ag: NEGATIVE

## 2020-08-20 MED ORDER — OLANZAPINE 10 MG PO TBDP
10.0000 mg | ORAL_TABLET | Freq: Once | ORAL | Status: AC
  Administered 2020-08-20: 10 mg via ORAL
  Filled 2020-08-20: qty 1

## 2020-08-20 MED ORDER — TRAZODONE HCL 50 MG PO TABS
50.0000 mg | ORAL_TABLET | Freq: Every evening | ORAL | Status: DC | PRN
  Administered 2020-08-21: 50 mg via ORAL
  Filled 2020-08-20: qty 1

## 2020-08-20 MED ORDER — LORAZEPAM 1 MG PO TABS
2.0000 mg | ORAL_TABLET | Freq: Once | ORAL | Status: AC
  Administered 2020-08-20: 2 mg via ORAL
  Filled 2020-08-20: qty 2

## 2020-08-20 MED ORDER — BICTEGRAVIR-EMTRICITAB-TENOFOV 50-200-25 MG PO TABS
1.0000 | ORAL_TABLET | Freq: Every day | ORAL | Status: DC
  Administered 2020-08-20 – 2020-08-21 (×2): 1 via ORAL
  Filled 2020-08-20 (×2): qty 1

## 2020-08-20 MED ORDER — MAGNESIUM HYDROXIDE 400 MG/5ML PO SUSP: 30.0000 mL | Freq: Every day | ORAL | Status: DC | PRN

## 2020-08-20 MED ORDER — ACETAMINOPHEN 325 MG PO TABS: 650.0000 mg | ORAL_TABLET | Freq: Four times a day (QID) | ORAL | Status: DC | PRN

## 2020-08-20 MED ORDER — DIPHENHYDRAMINE HCL 50 MG/ML IJ SOLN: 50.0000 mg | Freq: Four times a day (QID) | INTRAMUSCULAR | Status: DC | PRN

## 2020-08-20 MED ORDER — HYDROXYZINE HCL 25 MG PO TABS
25.0000 mg | ORAL_TABLET | Freq: Three times a day (TID) | ORAL | Status: DC | PRN
  Administered 2020-08-21 (×2): 25 mg via ORAL
  Filled 2020-08-20 (×2): qty 1

## 2020-08-20 MED ORDER — OLANZAPINE 5 MG PO TBDP: 15.0000 mg | ORAL_TABLET | Freq: Once | ORAL | Status: DC

## 2020-08-20 MED ORDER — LORAZEPAM 2 MG/ML IJ SOLN: 2.0000 mg | Freq: Four times a day (QID) | INTRAMUSCULAR | Status: DC | PRN

## 2020-08-20 MED ORDER — DIPHENHYDRAMINE HCL 50 MG PO CAPS
50.0000 mg | ORAL_CAPSULE | Freq: Once | ORAL | Status: AC
  Administered 2020-08-20: 50 mg via ORAL
  Filled 2020-08-20: qty 1

## 2020-08-20 MED ORDER — HALOPERIDOL LACTATE 5 MG/ML IJ SOLN: 5.0000 mg | Freq: Four times a day (QID) | INTRAMUSCULAR | Status: DC | PRN

## 2020-08-20 MED ORDER — LEVETIRACETAM 500 MG PO TABS
500.0000 mg | ORAL_TABLET | Freq: Two times a day (BID) | ORAL | Status: DC
  Administered 2020-08-20 – 2020-08-21 (×3): 500 mg via ORAL
  Filled 2020-08-20 (×2): qty 1
  Filled 2020-08-20: qty 14
  Filled 2020-08-20: qty 1
  Filled 2020-08-20: qty 14

## 2020-08-20 MED ORDER — ALUM & MAG HYDROXIDE-SIMETH 200-200-20 MG/5ML PO SUSP: 30.0000 mL | ORAL | Status: DC | PRN

## 2020-08-20 MED ORDER — LEVETIRACETAM 500 MG PO TABS: 500.0000 mg | ORAL_TABLET | Freq: Two times a day (BID) | ORAL | Status: DC

## 2020-08-20 MED ORDER — HALOPERIDOL 5 MG PO TABS
10.0000 mg | ORAL_TABLET | Freq: Once | ORAL | Status: AC
  Administered 2020-08-20: 10 mg via ORAL
  Filled 2020-08-20: qty 2

## 2020-08-20 NOTE — Progress Notes (Signed)
Pt is sitting on pull out chair quietly. Pt is alert and oriented. Pt is calm and cooperative at this time. Pt complained of back pain but refused Tylenol when offered. Pt took his meds after a lot of encouragement. Denied SI, HI and AVH. Staff will monitor for pt's safety.

## 2020-08-20 NOTE — ED Notes (Addendum)
Dash called  

## 2020-08-20 NOTE — Progress Notes (Signed)
Pt is asleep. Resps are even and unlabored. No acute distress noted. Staff will monitor for pt's safety.

## 2020-08-20 NOTE — ED Notes (Signed)
Pt sleeping@this time. Breathing even and unlabored. Will continue to monitor for safety 

## 2020-08-20 NOTE — Telephone Encounter (Signed)
It is recommended to be given in the buttock.

## 2020-08-20 NOTE — ED Notes (Signed)
Pt refused blood draw due to him not knowing who is looking@his  blood. NP Barbara Cower went in and talk with pt and he still refused
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
## 2020-08-20 NOTE — ED Notes (Signed)
Breakfast given: Cornflakes x2 and milk

## 2020-08-20 NOTE — ED Provider Notes (Signed)
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 is intended to  be used with other tests (e.g. RPR) as part of a  diagnostic algorithm in the diagnosis of syphilis. .    Admission on 08/07/2020, Discharged on 08/10/2020  Component Date Value Ref Range Status  . Sodium 08/07/2020 141  135 - 145 mmol/L Final  . Potassium 08/07/2020 4.6  3.5 - 5.1 mmol/L Final  . Chloride 08/07/2020 101  98 - 111 mmol/L Final  . CO2 08/07/2020 25  22 - 32 mmol/L Final  . Glucose, Bld 08/07/2020 84  70 - 99 mg/dL Final   Glucose reference range applies only to samples taken after fasting for at least 8 hours.  . BUN 08/07/2020 11  6 - 20 mg/dL Final  . Creatinine, Ser 08/07/2020 1.27* 0.61 - 1.24 mg/dL Final  . Calcium 46/96/2952 10.3  8.9 - 10.3 mg/dL Final  . Total Protein 08/07/2020 7.8  6.5 - 8.1 g/dL Final  . Albumin 84/13/2440 4.5  3.5 - 5.0 g/dL Final  . AST 04/30/2535 38  15 - 41 U/L Final  . ALT 08/07/2020 31  0 - 44 U/L Final  . Alkaline Phosphatase 08/07/2020 80  38 - 126 U/L Final  . Total Bilirubin 08/07/2020 3.0* 0.3 - 1.2 mg/dL Final  . GFR, Estimated 08/07/2020 >60  >60 mL/min Final   Comment: (NOTE) Calculated using the CKD-EPI Creatinine Equation (2021)   . Anion gap 08/07/2020 15  5 - 15 Final   Performed at [facility], [address] [place], [place] [address]  . Alcohol, Ethyl (B) 08/07/2020 <10  <10 mg/dL Final   Comment: (NOTE) Lowest detectable limit for serum alcohol is 10 mg/dL.  For medical purposes only. Performed at [facility], [address], [place], [place] [address]   . Salicylate Lvl 08/07/2020 <7.0* 7.0 - 30.0 mg/dL Final   Performed at [facility], [address], [place], [place] [address]  . Acetaminophen (Tylenol), Serum 08/07/2020 <10* 10 - 30 ug/mL Final   Comment: (NOTE) Therapeutic concentrations vary significantly. A range of 10-30 ug/mL  may be an effective concentration for many patients. However, some  are best treated at concentrations outside of this range. Acetaminophen concentrations >150 ug/mL at 4 hours after ingestion  and >50 ug/mL at 12 hours after ingestion are often associated with   toxic reactions.  Performed at [facility], [address] [place], [place] [address]   . WBC 08/07/2020 8.4  4.0 - 10.5 K/uL Final  . RBC 08/07/2020 4.97  4.22 - 5.81 MIL/uL Final  . Hemoglobin 08/07/2020 16.3  13.0 - 17.0 g/dL Final  . HCT 32/95/1884 49.6  39.0 - 52.0 % Final  . MCV 08/07/2020 99.8  80.0 - 100.0 fL Final  . MCH 08/07/2020 32.8  26.0 - 34.0 pg Final  . MCHC 08/07/2020 32.9  30.0 - 36.0 g/dL Final  . RDW 16/60/6301 13.1  11.5 - 15.5 % Final  . Platelets 08/07/2020 217  150 - 400 K/uL Final  . nRBC 08/07/2020 0.0  0.0 - 0.2 % Final   Performed at [facility], [address], [place], [place] [address]  . Opiates 08/07/2020 NONE DETECTED  NONE DETECTED Final  .  Cocaine 08/07/2020 NONE DETECTED  NONE DETECTED Final  . Benzodiazepines 08/07/2020 NONE DETECTED  NONE DETECTED Final  . Amphetamines 08/07/2020 POSITIVE* NONE DETECTED Final  . Tetrahydrocannabinol 08/07/2020 NONE DETECTED  NONE DETECTED Final  . Barbiturates 08/07/2020 NONE DETECTED  NONE DETECTED Final   Comment: (NOTE) DRUG SCREEN FOR MEDICAL PURPOSES ONLY.  IF CONFIRMATION IS NEEDED FOR ANY PURPOSE, NOTIFY LAB WITHIN 5 DAYS.  LOWEST DETECTABLE LIMITS FOR URINE DRUG SCREEN Drug Class                     Cutoff (ng/mL) Amphetamine and metabolites    1000 Barbiturate and metabolites    200 Benzodiazepine                 200 Tricyclics and metabolites     300 Opiates and metabolites        300 Cocaine and metabolites        300 THC                            50 Performed at [facility], [address], [place], [place] [address]   . SARS Coronavirus 2 08/07/2020 NEGATIVE  NEGATIVE Final   Comment: (NOTE) SARS-CoV-2 target nucleic acids are NOT DETECTED.  The SARS-CoV-2 RNA is generally detectable in upper and lower respiratory specimens during the acute phase of infection. The lowest concentration of SARS-CoV-2 viral copies this assay can detect is 250 copies / mL. A  negative result does not preclude SARS-CoV-2 infection and should not be used as the sole basis for treatment or other patient management decisions.  A negative result may occur with improper specimen collection / handling, submission of specimen other than nasopharyngeal swab, presence of viral mutation(s) within the areas targeted by this assay, and inadequate number of viral copies (<250 copies / mL). A negative result must be combined with clinical observations, patient history, and epidemiological information.  Fact Sheet for Patients:   BoilerBrush.com.cy  Fact Sheet for Healthcare Providers: https://pope.com/  This test is not yet approved or                           cleared by the [place] FDA and has been authorized for detection and/or diagnosis of SARS-CoV-2 by FDA under an Emergency Use Authorization (EUA).  This EUA will remain in effect (meaning this test can be used) for the duration of the COVID-19 declaration under Section 564(b)(1) of the Act, 21 U.S.C. section 360bbb-3(b)(1), unless the authorization is terminated or revoked sooner.  Performed at [facility], [address], [place], [place] [address]   Admission on 07/10/2020, Discharged on 07/10/2020  Component Date Value Ref Range Status  . SARS Coronavirus 2 by RT PCR 07/10/2020 NEGATIVE  NEGATIVE Final   Comment: (NOTE) SARS-CoV-2 target nucleic acids are NOT DETECTED.  The SARS-CoV-2 RNA is generally detectable in upper respiratory specimens during the acute phase of infection. The lowest concentration of SARS-CoV-2 viral copies this assay can detect is 138 copies/mL. A negative result does not preclude SARS-Cov-2 infection and should not be used as the sole basis for treatment or other patient management decisions. A negative result may occur with  improper specimen collection/handling, submission of specimen other than nasopharyngeal swab,  presence of viral mutation(s) within the areas targeted by this assay, and inadequate number of viral copies(<138 copies/mL). A negative result must  be combined with clinical observations, patient history, and epidemiological information. The expected result is Negative.  Fact Sheet for Patients:  BloggerCourse.com  Fact Sheet for Healthcare Providers:  SeriousBroker.it  This test is no                          t yet approved or cleared by the [place] FDA and  has been authorized for detection and/or diagnosis of SARS-CoV-2 by FDA under an Emergency Use Authorization (EUA). This EUA will remain  in effect (meaning this test can be used) for the duration of the COVID-19 declaration under Section 564(b)(1) of the Act, 21 U.S.C.section 360bbb-3(b)(1), unless the authorization is terminated  or revoked sooner.      . Influenza A by PCR 07/10/2020 NEGATIVE  NEGATIVE Final  . Influenza B by PCR 07/10/2020 NEGATIVE  NEGATIVE Final   Comment: (NOTE) The Xpert Xpress SARS-CoV-2/FLU/RSV plus assay is intended as an aid in the diagnosis of influenza from Nasopharyngeal swab specimens and should not be used as a sole basis for treatment. Nasal washings and aspirates are unacceptable for Xpert Xpress SARS-CoV-2/FLU/RSV testing.  Fact Sheet for Patients: BloggerCourse.com  Fact Sheet for Healthcare Providers: SeriousBroker.it  This test is not yet approved or cleared by the [place] FDA and has been authorized for detection and/or diagnosis of SARS-CoV-2 by FDA under an Emergency Use Authorization (EUA). This EUA will remain in effect (meaning this test can be used) for the duration of the COVID-19 declaration under Section 564(b)(1) of the Act, 21 U.S.C. section 360bbb-3(b)(1), unless the authorization is terminated or revoked.  Performed at [facility], [address], [place], [place] [address]   . SARS Coronavirus 2 Ag 07/10/2020 Negative  Negative Preliminary  . WBC 07/10/2020 7.5  4.0 - 10.5 K/uL Final  . RBC 07/10/2020 4.29  4.22 - 5.81 MIL/uL Final  . Hemoglobin 07/10/2020 13.8  13.0 - 17.0 g/dL Final  . HCT 62/13/0865 41.1  39.0 - 52.0 % Final  . MCV 07/10/2020 95.8  80.0 - 100.0 fL Final  . MCH 07/10/2020 32.2  26.0 - 34.0 pg Final  . MCHC 07/10/2020 33.6  30.0 - 36.0 g/dL Final  . RDW 78/46/9629 12.0  11.5 - 15.5 % Final  . Platelets 07/10/2020 301  150 - 400 K/uL Final  . nRBC 07/10/2020 0.0  0.0 - 0.2 % Final  . Neutrophils Relative % 07/10/2020 69  % Final  . Neutro Abs 07/10/2020 5.1  1.7 - 7.7 K/uL Final  . Lymphocytes Relative 07/10/2020 21  % Final  . Lymphs Abs 07/10/2020 1.6  0.7 - 4.0 K/uL Final  . Monocytes Relative 07/10/2020 9  % Final  . Monocytes Absolute 07/10/2020 0.7  0.1 - 1.0 K/uL Final  . Eosinophils Relative 07/10/2020 0  % Final  . Eosinophils Absolute 07/10/2020 0.0  0.0 - 0.5 K/uL Final  . Basophils Relative 07/10/2020 0  % Final  . Basophils Absolute 07/10/2020 0.0  0.0 - 0.1 K/uL Final  . Immature Granulocytes 07/10/2020 1  % Final  . Abs Immature Granulocytes 07/10/2020 0.04  0.00 - 0.07 K/uL Final   Performed at [facility], [address] [place], [place] [address]  . Sodium 07/10/2020 139  135 - 145 mmol/L Final  . Potassium 07/10/2020 4.1  3.5 - 5.1 mmol/L Final  . Chloride 07/10/2020 100  98 - 111 mmol/L Final  . CO2 07/10/2020 24  22 -  32 mmol/L Final  . Glucose, Bld 07/10/2020 100* 70 - 99 mg/dL Final   Glucose reference range applies only to samples taken after fasting for at least 8 hours.  . BUN 07/10/2020 15  6 - 20 mg/dL Final  . Creatinine, Ser 07/10/2020 1.44* 0.61 - 1.24 mg/dL Final  . Calcium 16/04/9603 9.4  8.9 - 10.3 mg/dL Final  . Total Protein 07/10/2020 7.1  6.5 - 8.1 g/dL Final  . Albumin 54/03/8118 3.9  3.5 - 5.0 g/dL Final  . AST 14/78/2956 26  15 - 41 U/L Final  . ALT  07/10/2020 23  0 - 44 U/L Final  . Alkaline Phosphatase 07/10/2020 70  38 - 126 U/L Final  . Total Bilirubin 07/10/2020 1.0  0.3 - 1.2 mg/dL Final  . GFR, Estimated 07/10/2020 >60  >60 mL/min Final   Comment: (NOTE) Calculated using the CKD-EPI Creatinine Equation (2021)   . Anion gap 07/10/2020 15  5 - 15 Final   Performed at [facility], [address] [place], [place] [address]  . Acetaminophen (Tylenol), Serum 07/10/2020 16  10 - 30 ug/mL Final   Comment: (NOTE) Therapeutic concentrations vary significantly. A range of 10-30 ug/mL  may be an effective concentration for many patients. However, some  are best treated at concentrations outside of this range. Acetaminophen concentrations >150 ug/mL at 4 hours after ingestion  and >50 ug/mL at 12 hours after ingestion are often associated with  toxic reactions.  Performed at [facility], [address] [place], [place] [address]   . Salicylate Lvl 07/10/2020 <7.0* 7.0 - 30.0 mg/dL Final   Performed at [facility], [address] [place], [place] [address]  . POC Amphetamine UR 07/10/2020 None Detected  NONE DETECTED (Cut Off Level 1000 ng/mL) Final  . POC Secobarbital (BAR) 07/10/2020 None Detected  NONE DETECTED (Cut Off Level 300 ng/mL) Final  . POC Buprenorphine (BUP) 07/10/2020 None Detected  NONE DETECTED (Cut Off Level 10 ng/mL) Final  . POC Oxazepam (BZO) 07/10/2020 None Detected  NONE DETECTED (Cut Off Level 300 ng/mL) Final  . POC Cocaine UR 07/10/2020 None Detected  NONE DETECTED (Cut Off Level 300 ng/mL) Final  . POC Methamphetamine UR 07/10/2020 None Detected  NONE DETECTED (Cut Off Level 1000 ng/mL) Final  . POC Morphine 07/10/2020 Positive* NONE DETECTED (Cut Off Level 300 ng/mL) Final  . POC Oxycodone UR 07/10/2020 None Detected  NONE DETECTED (Cut Off Level 100 ng/mL) Final  . POC Methadone UR 07/10/2020 None Detected  NONE DETECTED (Cut Off Level 300 ng/mL) Final  . POC Marijuana UR 07/10/2020  None Detected  NONE DETECTED (Cut Off Level 50 ng/mL) Final  Admission on 06/21/2020, Discharged on 06/21/2020  Component Date Value Ref Range Status  . Sodium 06/21/2020 139  135 - 145 mmol/L Final  . Potassium 06/21/2020 3.6  3.5 - 5.1 mmol/L Final  . Chloride 06/21/2020 106  98 - 111 mmol/L Final  . CO2 06/21/2020 24  22 - 32 mmol/L Final  . Glucose, Bld 06/21/2020 109* 70 - 99 mg/dL Final   Glucose reference range applies only to samples taken after fasting for at least 8 hours.  . BUN 06/21/2020 12  6 - 20 mg/dL Final  . Creatinine, Ser 06/21/2020 1.03  0.61 - 1.24 mg/dL Final  . Calcium 52/84/1324 9.6  8.9 - 10.3 mg/dL Final  . Total Protein 06/21/2020 7.7  6.5 - 8.1 g/dL Final  . Albumin 40/04/2724 4.2  3.5 - 5.0 g/dL Final  . AST 16/04/9603 89* 15 - 41 U/L Final  . ALT 06/21/2020 45* 0 - 44 U/L Final  . Alkaline Phosphatase 06/21/2020 80  38 - 126 U/L Final  . Total Bilirubin 06/21/2020 1.6* 0.3 - 1.2 mg/dL Final  . GFR, Estimated 06/21/2020 >60  >60 mL/min Final   Comment: (NOTE) Calculated using the CKD-EPI Creatinine Equation (2021)   . Anion gap 06/21/2020 9  5 - 15 Final   Performed at [facility], [address] [place], [place] [address]  Admission on 04/18/2020, Discharged on 04/19/2020  Component Date Value Ref Range Status  . SARS Coronavirus 2 by RT PCR 04/18/2020 NEGATIVE  NEGATIVE Final   Comment: (NOTE) SARS-CoV-2 target nucleic acids are NOT DETECTED.  The SARS-CoV-2 RNA is generally detectable in upper respiratoy specimens during the acute phase of infection. The lowest concentration of SARS-CoV-2 viral copies this assay can detect is 131 copies/mL. A negative result does not preclude SARS-Cov-2 infection and should not be used as the sole basis for treatment or other patient management decisions. A negative result may occur with  improper specimen collection/handling, submission of specimen other than nasopharyngeal swab, presence of viral  mutation(s) within the areas targeted by this assay, and inadequate number of viral copies (<131 copies/mL). A negative result must be combined with clinical observations, patient history, and epidemiological information. The expected result is Negative.  Fact Sheet for Patients:  https://www.moore.com/  Fact Sheet for Healthcare Providers:  https://www.young.biz/  This test is no                          t yet approved or cleared by the [place] FDA and  has been authorized for detection and/or diagnosis of SARS-CoV-2 by FDA under an Emergency Use Authorization (EUA). This EUA will remain  in effect (meaning this test can be used) for the duration of the COVID-19 declaration under Section 564(b)(1) of the Act, 21 U.S.C. section 360bbb-3(b)(1), unless the authorization is terminated or revoked sooner.    . Influenza A by PCR 04/18/2020 NEGATIVE  NEGATIVE Final  . Influenza B by PCR 04/18/2020 NEGATIVE  NEGATIVE Final   Comment: (NOTE) The Xpert Xpress SARS-CoV-2/FLU/RSV assay is intended as an aid in  the diagnosis of influenza from Nasopharyngeal swab specimens and  should not be used as a sole basis for treatment. Nasal washings and  aspirates are unacceptable for Xpert Xpress SARS-CoV-2/FLU/RSV  testing.  Fact Sheet for Patients: https://www.moore.com/  Fact Sheet for Healthcare Providers: https://www.young.biz/  This test is not yet approved or cleared by the [place] FDA and  has been authorized for detection and/or diagnosis of SARS-CoV-2 by  FDA under an Emergency Use Authorization (EUA). This EUA will remain  in effect (meaning this test can be used) for the duration of the  Covid-19 declaration under Section 564(b)(1) of the Act, 21  U.S.C. section 360bbb-3(b)(1), unless the authorization is  terminated or revoked. Performed at [facility], [address],  [place], [place] [address]   . WBC 04/18/2020 7.4  4.0 - 10.5 K/uL Final  . RBC 04/18/2020 4.97  4.22 - 5.81 MIL/uL Final  . Hemoglobin 04/18/2020 15.7  13.0 - 17.0 g/dL Final  . HCT 78/29/5621 47.8  39.0 - 52.0 % Final  . MCV 04/18/2020 96.2  80.0 - 100.0 fL Final  . MCH 04/18/2020 31.6  26.0 - 34.0 pg Final  . MCHC  04/18/2020 32.8  30.0 - 36.0 g/dL Final  . RDW 16/04/9603 11.9  11.5 - 15.5 % Final  . Platelets 04/18/2020 296  150 - 400 K/uL Final  . nRBC 04/18/2020 0.0  0.0 - 0.2 % Final  . Neutrophils Relative % 04/18/2020 62  % Final  . Neutro Abs 04/18/2020 4.5  1.7 - 7.7 K/uL Final  . Lymphocytes Relative 04/18/2020 29  % Final  . Lymphs Abs 04/18/2020 2.1  0.7 - 4.0 K/uL Final  . Monocytes Relative 04/18/2020 9  % Final  . Monocytes Absolute 04/18/2020 0.7  0.1 - 1.0 K/uL Final  . Eosinophils Relative 04/18/2020 0  % Final  . Eosinophils Absolute 04/18/2020 0.0  0.0 - 0.5 K/uL Final  . Basophils Relative 04/18/2020 0  % Final  . Basophils Absolute 04/18/2020 0.0  0.0 - 0.1 K/uL Final  . Immature Granulocytes 04/18/2020 0  % Final  . Abs Immature Granulocytes 04/18/2020 0.02  0.00 - 0.07 K/uL Final   Performed at [facility], [address], [place], [place] [address]  . Sodium 04/18/2020 137  135 - 145 mmol/L Final  . Potassium 04/18/2020 3.3* 3.5 - 5.1 mmol/L Final  . Chloride 04/18/2020 99  98 - 111 mmol/L Final  . CO2 04/18/2020 25  22 - 32 mmol/L Final  . Glucose, Bld 04/18/2020 103* 70 - 99 mg/dL Final   Glucose reference range applies only to samples taken after fasting for at least 8 hours.  . BUN 04/18/2020 12  6 - 20 mg/dL Final  . Creatinine, Ser 04/18/2020 1.18  0.61 - 1.24 mg/dL Final  . Calcium 11/91/4782 9.7  8.9 - 10.3 mg/dL Final  . Total Protein 04/18/2020 8.0  6.5 - 8.1 g/dL Final  . Albumin 95/62/1308 4.5  3.5 - 5.0 g/dL Final  . AST 65/78/4696 42* 15 - 41 U/L Final  . ALT 04/18/2020 48* 0 - 44 U/L Final  . Alkaline Phosphatase 04/18/2020 99  38 - 126  U/L Final  . Total Bilirubin 04/18/2020 2.6* 0.3 - 1.2 mg/dL Final  . GFR, Estimated 04/18/2020 >60  >60 mL/min Final  . Anion gap 04/18/2020 13  5 - 15 Final   Performed at [facility], [address], [place], [place] [address]  . Hgb A1c MFr Bld 04/18/2020 4.7* 4.8 - 5.6 % Final   Comment: (NOTE)         Prediabetes: 5.7 - 6.4         Diabetes: >6.4         Glycemic control for adults with diabetes: <7.0   . Mean Plasma Glucose 04/18/2020 88  mg/dL Final   Comment: (NOTE) Performed At: [facility] [address] [place], [place] [id number] Jolene Schimke MD UV:[id number]   . Alcohol, Ethyl (B) 04/18/2020 <10  <10 mg/dL Final   Comment: (NOTE) Lowest detectable limit for serum alcohol is 10 mg/dL.  For medical purposes only. Performed at [facility], [address], [place], [place] [address]   . POC Amphetamine UR 04/18/2020 Positive* None Detected Final  . POC Secobarbital (BAR) 04/18/2020 None Detected  None Detected Final  . POC Buprenorphine (BUP) 04/18/2020 None Detected  None Detected Final  . POC Oxazepam (BZO) 04/18/2020 None Detected  None Detected Final  . POC Cocaine UR 04/18/2020 None Detected  None Detected Final  . POC Methamphetamine UR 04/18/2020 Positive* None Detected Final  . POC Morphine 04/18/2020 None Detected  None Detected Final  .  POC Oxycodone UR 04/18/2020 None Detected  None Detected Final  . POC Methadone UR 04/18/2020 None Detected  None Detected Final  . POC Marijuana UR 04/18/2020 None Detected  None Detected Final  . SARS Coronavirus 2 Ag 04/18/2020 NEGATIVE  NEGATIVE Final   Comment: (NOTE) SARS-CoV-2 antigen NOT DETECTED.   Negative results are presumptive.  Negative results do not preclude SARS-CoV-2 infection and should not be used as the sole basis for treatment or other patient management decisions, including infection  control decisions, particularly in the presence of clinical signs and  symptoms consistent  with COVID-19, or in those who have been in contact with the virus.  Negative results must be combined with clinical observations, patient history, and epidemiological information. The expected result is Negative.  Fact Sheet for Patients: https://sanders-williams.net/  Fact Sheet for Healthcare Providers: https://martinez.com/   This test is not yet approved or cleared by the [place] FDA and  has been authorized for detection and/or diagnosis of SARS-CoV-2 by FDA under an Emergency Use Authorization (EUA).  This EUA will remain in effect (meaning this test can be used) for the duration of  the C                          OVID-19 declaration under Section 564(b)(1) of the Act, 21 U.S.C. section 360bbb-3(b)(1), unless the authorization is terminated or revoked sooner.    . Cholesterol 04/18/2020 164  0 - 200 mg/dL Final  . Triglycerides 04/18/2020 39  <150 mg/dL Final  . HDL 74/02/1447 58  >40 mg/dL Final  . Total CHOL/HDL Ratio 04/18/2020 2.8  RATIO Final  . VLDL 04/18/2020 8  0 - 40 mg/dL Final  . LDL Cholesterol 04/18/2020 98  0 - 99 mg/dL Final   Comment:        Total Cholesterol/HDL:CHD Risk Coronary Heart Disease Risk Table                     Men   Women  1/2 Average Risk   3.4   3.3  Average Risk       5.0   4.4  2 X Average Risk   9.6   7.1  3 X Average Risk  23.4   11.0        Use the calculated Patient Ratio above and the CHD Risk Table to determine the patient's CHD Risk.        ATP III CLASSIFICATION (LDL):  <100     mg/dL   Optimal  185-631  mg/dL   Near or Above                    Optimal  130-159  mg/dL   Borderline  497-026  mg/dL   High  >378     mg/dL   Very High Performed at [facility], [address], [place], [place] [address]   Admission on 04/10/2020, Discharged on 04/12/2020  Component Date Value Ref Range Status  . SARS Coronavirus 2 by RT PCR 04/10/2020 NEGATIVE  NEGATIVE Final   Comment:  (NOTE) SARS-CoV-2 target nucleic acids are NOT DETECTED.  The SARS-CoV-2 RNA is generally detectable in upper respiratoy specimens during the acute phase of infection. The lowest concentration of SARS-CoV-2 viral copies this assay can detect is 131 copies/mL. A negative result does not preclude SARS-Cov-2 infection and should not be used as the sole basis for treatment or other patient management  decisions. A negative result may occur with  improper specimen collection/handling, submission of specimen other than nasopharyngeal swab, presence of viral mutation(s) within the areas targeted by this assay, and inadequate number of viral copies (<131 copies/mL). A negative result must be combined with clinical observations, patient history, and epidemiological information. The expected result is Negative.  Fact Sheet for Patients:  https://www.moore.com/https://www.fda.gov/media/142436/download  Fact Sheet for Healthcare Providers:  https://www.young.biz/https://www.fda.gov/media/142435/download  This test is no                          t yet approved or cleared by the [place] FDA and  has been authorized for detection and/or diagnosis of SARS-CoV-2 by FDA under an Emergency Use Authorization (EUA). This EUA will remain  in effect (meaning this test can be used) for the duration of the COVID-19 declaration under Section 564(b)(1) of the Act, 21 U.S.C. section 360bbb-3(b)(1), unless the authorization is terminated or revoked sooner.    . Influenza A by PCR 04/10/2020 NEGATIVE  NEGATIVE Final  . Influenza B by PCR 04/10/2020 NEGATIVE  NEGATIVE Final   Comment: (NOTE) The Xpert Xpress SARS-CoV-2/FLU/RSV assay is intended as an aid in  the diagnosis of influenza from Nasopharyngeal swab specimens and  should not be used as a sole basis for treatment. Nasal washings and  aspirates are unacceptable for Xpert Xpress SARS-CoV-2/FLU/RSV  testing.  Fact Sheet for Patients: https://www.moore.com/https://www.fda.gov/media/142436/download  Fact  Sheet for Healthcare Providers: https://www.young.biz/https://www.fda.gov/media/142435/download  This test is not yet approved or cleared by the [place] FDA and  has been authorized for detection and/or diagnosis of SARS-CoV-2 by  FDA under an Emergency Use Authorization (EUA). This EUA will remain  in effect (meaning this test can be used) for the duration of the  Covid-19 declaration under Section 564(b)(1) of the Act, 21  U.S.C. section 360bbb-3(b)(1), unless the authorization is  terminated or revoked. Performed at [facility], [address] [place], [place] [address]   . Sodium 04/10/2020 141  135 - 145 mmol/L Final  . Potassium 04/10/2020 3.6  3.5 - 5.1 mmol/L Final  . Chloride 04/10/2020 103  98 - 111 mmol/L Final  . CO2 04/10/2020 26  22 - 32 mmol/L Final  . Glucose, Bld 04/10/2020 96  70 - 99 mg/dL Final   Glucose reference range applies only to samples taken after fasting for at least 8 hours.  . BUN 04/10/2020 16  6 - 20 mg/dL Final  . Creatinine, Ser 04/10/2020 1.18  0.61 - 1.24 mg/dL Final  . Calcium 91/47/829510/02/2020 9.6  8.9 - 10.3 mg/dL Final  . Total Protein 04/10/2020 8.1  6.5 - 8.1 g/dL Final  . Albumin 62/13/086510/02/2020 4.8  3.5 - 5.0 g/dL Final  . AST 78/46/962910/02/2020 30  15 - 41 U/L Final  . ALT 04/10/2020 29  0 - 44 U/L Final  . Alkaline Phosphatase 04/10/2020 85  38 - 126 U/L Final  . Total Bilirubin 04/10/2020 2.1* 0.3 - 1.2 mg/dL Final  . GFR, Estimated 04/10/2020 >60  >60 mL/min Final  . Anion gap 04/10/2020 12  5 - 15 Final   Performed at [facility], [address] [place], [place] [address]  . Alcohol, Ethyl (B) 04/10/2020 <10  <10 mg/dL Final   Comment: (NOTE) Lowest detectable limit for serum alcohol is 10 mg/dL.  For medical purposes only. Performed at [facility], [address] [place], [place] [id number]   . Opiates  04/11/2020 NONE DETECTED  NONE DETECTED Final  . Cocaine 04/11/2020 NONE DETECTED  NONE DETECTED  Final  . Benzodiazepines 04/11/2020 NONE DETECTED  NONE DETECTED Final  . Amphetamines 04/11/2020 POSITIVE* NONE DETECTED Final  . Tetrahydrocannabinol 04/11/2020 NONE DETECTED  NONE DETECTED Final  . Barbiturates 04/11/2020 NONE DETECTED  NONE DETECTED Final   Comment: (NOTE) DRUG SCREEN FOR MEDICAL PURPOSES ONLY.  IF CONFIRMATION IS NEEDED FOR ANY PURPOSE, NOTIFY LAB WITHIN 5 DAYS.  LOWEST DETECTABLE LIMITS FOR URINE DRUG SCREEN Drug Class                     Cutoff (ng/mL) Amphetamine and metabolites    1000 Barbiturate and metabolites    200 Benzodiazepine                 200 Tricyclics and metabolites     300 Opiates and metabolites        300 Cocaine and metabolites        300 THC                            50 Performed at [facility], [address] [place], [place] [address]   . WBC 04/10/2020 10.1  4.0 - 10.5 K/uL Final  . RBC 04/10/2020 4.68  4.22 - 5.81 MIL/uL Final  . Hemoglobin 04/10/2020 15.0  13.0 - 17.0 g/dL Final  . HCT 98/92/1194 45.1  39.0 - 52.0 % Final  . MCV 04/10/2020 96.4  80.0 - 100.0 fL Final  . MCH 04/10/2020 32.1  26.0 - 34.0 pg Final  . MCHC 04/10/2020 33.3  30.0 - 36.0 g/dL Final  . RDW 17/40/8144 12.3  11.5 - 15.5 % Final  . Platelets 04/10/2020 243  150 - 400 K/uL Final  . nRBC 04/10/2020 0.0  0.0 - 0.2 % Final  . Neutrophils Relative % 04/10/2020 67  % Final  . Neutro Abs 04/10/2020 6.7  1.7 - 7.7 K/uL Final  . Lymphocytes Relative 04/10/2020 20  % Final  . Lymphs Abs 04/10/2020 2.1  0.7 - 4.0 K/uL Final  . Monocytes Relative 04/10/2020 13  % Final  . Monocytes Absolute 04/10/2020 1.3* 0.1 - 1.0 K/uL Final  . Eosinophils Relative 04/10/2020 0  % Final  . Eosinophils Absolute 04/10/2020 0.0  0.0 - 0.5 K/uL Final  . Basophils Relative 04/10/2020 0  % Final  . Basophils Absolute 04/10/2020 0.0  0.0 - 0.1 K/uL Final  . Immature Granulocytes 04/10/2020 0  % Final  . Abs Immature Granulocytes 04/10/2020 0.04  0.00 - 0.07  K/uL Final   Performed at [facility], [address], [place], [place] [address]  Admission on 03/26/2020, Discharged on 03/27/2020  Component Date Value Ref Range Status  . WBC 03/26/2020 7.1  4.0 - 10.5 K/uL Final  . RBC 03/26/2020 5.23  4.22 - 5.81 MIL/uL Final  . Hemoglobin 03/26/2020 17.1* 13.0 - 17.0 g/dL Final  . HCT 31/49/7026 50.8  39.0 - 52.0 % Final  . MCV 03/26/2020 97.1  80.0 - 100.0 fL Final  . MCH 03/26/2020 32.7  26.0 - 34.0 pg Final  . MCHC 03/26/2020 33.7  30.0 - 36.0 g/dL Final  . RDW 37/85/8850 12.2  11.5 - 15.5 % Final  . Platelets 03/26/2020 233  150 - 400 K/uL Final  . nRBC 03/26/2020 0.0  0.0 - 0.2 % Final  . Neutrophils Relative % 03/26/2020 50  %  Final  . Neutro Abs 03/26/2020 3.5  1.7 - 7.7 K/uL Final  . Lymphocytes Relative 03/26/2020 37  % Final  . Lymphs Abs 03/26/2020 2.6  0.7 - 4.0 K/uL Final  . Monocytes Relative 03/26/2020 11  % Final  . Monocytes Absolute 03/26/2020 0.8  0.1 - 1.0 K/uL Final  . Eosinophils Relative 03/26/2020 1  % Final  . Eosinophils Absolute 03/26/2020 0.1  0.0 - 0.5 K/uL Final  . Basophils Relative 03/26/2020 1  % Final  . Basophils Absolute 03/26/2020 0.0  0.0 - 0.1 K/uL Final  . Immature Granulocytes 03/26/2020 0  % Final  . Abs Immature Granulocytes 03/26/2020 0.02  0.00 - 0.07 K/uL Final   Performed at [facility], [address], [place], [place] [address]  . Sodium 03/26/2020 142  135 - 145 mmol/L Final  . Potassium 03/26/2020 3.8  3.5 - 5.1 mmol/L Final  . Chloride 03/26/2020 102  98 - 111 mmol/L Final  . CO2 03/26/2020 25  22 - 32 mmol/L Final  . Glucose, Bld 03/26/2020 96  70 - 99 mg/dL Final   Glucose reference range applies only to samples taken after fasting for at least 8 hours.  . BUN 03/26/2020 13  6 - 20 mg/dL Final  . Creatinine, Ser 03/26/2020 1.15  0.61 - 1.24 mg/dL Final  . Calcium 60/45/4098 10.2  8.9 - 10.3 mg/dL Final  . Total Protein 03/26/2020 8.7* 6.5 - 8.1  g/dL Final  . Albumin 11/91/4782 4.9  3.5 - 5.0 g/dL Final  . AST 95/62/1308 37  15 - 41 U/L Final  . ALT 03/26/2020 36  0 - 44 U/L Final  . Alkaline Phosphatase 03/26/2020 86  38 - 126 U/L Final  . Total Bilirubin 03/26/2020 1.8* 0.3 - 1.2 mg/dL Final  . GFR calc non Af Amer 03/26/2020 >60  >60 mL/min Final  . GFR calc Af Amer 03/26/2020 >60  >60 mL/min Final  . Anion gap 03/26/2020 15  5 - 15 Final   Performed at [facility], [address], [place], [place] [address]  . SARS Coronavirus 2 by RT PCR 03/26/2020 NEGATIVE  NEGATIVE Final   Comment: (NOTE) SARS-CoV-2 target nucleic acids are NOT DETECTED.  The SARS-CoV-2 RNA is generally detectable in upper respiratoy specimens during the acute phase of infection. The lowest concentration of SARS-CoV-2 viral copies this assay can detect is 131 copies/mL. A negative result does not preclude SARS-Cov-2 infection and should not be used as the sole basis for treatment or other patient management decisions. A negative result may occur with  improper specimen collection/handling, submission of specimen other than nasopharyngeal swab, presence of viral mutation(s) within the areas targeted by this assay, and inadequate number of viral copies (<131 copies/mL). A negative result must be combined with clinical observations, patient history, and epidemiological information. The expected result is Negative.  Fact Sheet for Patients:  https://www.moore.com/  Fact Sheet for Healthcare Providers:  https://www.young.biz/  This test is no                          t yet approved or cleared by the [place] FDA and  has been authorized for detection and/or diagnosis of SARS-CoV-2 by FDA under an Emergency Use Authorization (EUA). This EUA will remain  in effect (meaning this test can be used) for the duration of the COVID-19 declaration under Section 564(b)(1) of the Act, 21  U.S.C. section 360bbb-3(b)(1), unless  the authorization is terminated or revoked sooner.    . Influenza A by PCR 03/26/2020 NEGATIVE  NEGATIVE Final  . Influenza B by PCR 03/26/2020 NEGATIVE  NEGATIVE Final   Comment: (NOTE) The Xpert Xpress SARS-CoV-2/FLU/RSV assay is intended as an aid in  the diagnosis of influenza from Nasopharyngeal swab specimens and  should not be used as a sole basis for treatment. Nasal washings and  aspirates are unacceptable for Xpert Xpress SARS-CoV-2/FLU/RSV  testing.  Fact Sheet for Patients: https://www.moore.com/  Fact Sheet for Healthcare Providers: https://www.young.biz/  This test is not yet approved or cleared by the [place] FDA and  has been authorized for detection and/or diagnosis of SARS-CoV-2 by  FDA under an Emergency Use Authorization (EUA). This EUA will remain  in effect (meaning this test can be used) for the duration of the  Covid-19 declaration under Section 564(b)(1) of the Act, 21  U.S.C. section 360bbb-3(b)(1), unless the authorization is  terminated or revoked.   [name] Respiratory Syncytial Virus by PCR 03/26/2020 NEGATIVE  NEGATIVE Final   Comment: (NOTE) Fact Sheet for Patients: https://www.moore.com/  Fact Sheet for Healthcare Providers: https://www.young.biz/  This test is not yet approved or cleared by the [place] FDA and  has been authorized for detection and/or diagnosis of SARS-CoV-2 by  FDA under an Emergency Use Authorization (EUA). This EUA will remain  in effect (meaning this test can be used) for the duration of the  COVID-19 declaration under Section 564(b)(1) of the Act, 21 U.S.C.  section 360bbb-3(b)(1), unless the authorization is terminated or  revoked. Performed at [facility], [address], [place], [place] [address]   . Opiates 03/26/2020 NONE DETECTED  NONE DETECTED Final  . Cocaine  03/26/2020 NONE DETECTED  NONE DETECTED Final  . Benzodiazepines 03/26/2020 POSITIVE* NONE DETECTED Final  . Amphetamines 03/26/2020 POSITIVE* NONE DETECTED Final  . Tetrahydrocannabinol 03/26/2020 POSITIVE* NONE DETECTED Final  . Barbiturates 03/26/2020 NONE DETECTED  NONE DETECTED Final   Comment: (NOTE) DRUG SCREEN FOR MEDICAL PURPOSES ONLY.  IF CONFIRMATION IS NEEDED FOR ANY PURPOSE, NOTIFY LAB WITHIN 5 DAYS.  LOWEST DETECTABLE LIMITS FOR URINE DRUG SCREEN Drug Class                     Cutoff (ng/mL) Amphetamine and metabolites    1000 Barbiturate and metabolites    200 Benzodiazepine                 200 Tricyclics and metabolites     300 Opiates and metabolites        300 Cocaine and metabolites        300 THC                            50 Performed at [facility], [address] [place], [place] [address]   . Alcohol, Ethyl (B) 03/26/2020 <10  <10 mg/dL Final   Comment: (NOTE) Lowest detectable limit for serum alcohol is 10 mg/dL.  For medical purposes only. Performed at [facility], [address], [place], [place] [address]   . Salicylate Lvl 03/26/2020 <7.0* 7.0 - 30.0 mg/dL Final   Performed at [facility], [address] [place], [place] [address]  . Acetaminophen (Tylenol), Serum 03/26/2020 <10* 10 - 30 ug/mL Final   Comment: (NOTE) Therapeutic concentrations vary significantly. A range of 10-30 ug/mL  may be an effective concentration for  many patients. However, some  are best treated at concentrations outside of this range. Acetaminophen concentrations >150 ug/mL at 4 hours after ingestion  and >50 ug/mL at 12 hours after ingestion are often associated with  toxic reactions.  Performed at [facility], [address] [place], [place] [address]   Admission on 03/22/2020, Discharged on 03/23/2020  Component Date Value Ref Range Status  . SARS Coronavirus 2 03/22/2020 NEGATIVE   NEGATIVE Final   Comment: (NOTE) SARS-CoV-2 target nucleic acids are NOT DETECTED.  The SARS-CoV-2 RNA is generally detectable in upper and lower respiratory specimens during the acute phase of infection. The lowest concentration of SARS-CoV-2 viral copies this assay can detect is 250 copies / mL. A negative result does not preclude SARS-CoV-2 infection and should not be used as the sole basis for treatment or other patient management decisions.  A negative result may occur with improper specimen collection / handling, submission of specimen other than nasopharyngeal swab, presence of viral mutation(s) within the areas targeted by this assay, and inadequate number of viral copies (<250 copies / mL). A negative result must be combined with clinical observations, patient history, and epidemiological information.  Fact Sheet for Patients:   BoilerBrush.com.cy  Fact Sheet for Healthcare Providers: https://pope.com/  This test is not yet approved or                           cleared by the [place] FDA and has been authorized for detection and/or diagnosis of SARS-CoV-2 by FDA under an Emergency Use Authorization (EUA).  This EUA will remain in effect (meaning this test can be used) for the duration of the COVID-19 declaration under Section 564(b)(1) of the Act, 21 U.S.C. section 360bbb-3(b)(1), unless the authorization is terminated or revoked sooner.  Performed at [facility], [address], [place], [place] [address]   . Sodium 03/22/2020 137  135 - 145 mmol/L Final  . Potassium 03/22/2020 3.2* 3.5 - 5.1 mmol/L Final  . Chloride 03/22/2020 101  98 - 111 mmol/L Final  . CO2 03/22/2020 21* 22 - 32 mmol/L Final  . Glucose, Bld 03/22/2020 144* 70 - 99 mg/dL Final   Glucose reference range applies only to samples taken after fasting for at least 8 hours.  . BUN 03/22/2020 10  6 - 20 mg/dL Final  . Creatinine,  Ser 03/22/2020 1.03  0.61 - 1.24 mg/dL Final  . Calcium 09/81/1914 9.7  8.9 - 10.3 mg/dL Final  . Total Protein 03/22/2020 8.1  6.5 - 8.1 g/dL Final  . Albumin 78/29/5621 5.0  3.5 - 5.0 g/dL Final  . AST 30/86/5784 70* 15 - 41 U/L Final  . ALT 03/22/2020 49* 0 - 44 U/L Final  . Alkaline Phosphatase 03/22/2020 78  38 - 126 U/L Final  . Total Bilirubin 03/22/2020 3.8* 0.3 - 1.2 mg/dL Final  . GFR calc non Af Amer 03/22/2020 >60  >60 mL/min Final  . GFR calc Af Amer 03/22/2020 >60  >60 mL/min Final  . Anion gap 03/22/2020 15  5 - 15 Final   Performed at [facility], [address], [place], [place] [address]  . Alcohol, Ethyl (B) 03/22/2020 <10  <10 mg/dL Final   Comment: (NOTE) Lowest detectable limit for serum alcohol is 10 mg/dL.  For medical purposes only. Performed at [facility], [address] [place], [place] [address]   . Opiates 03/22/2020 NONE DETECTED  NONE DETECTED Final  . Cocaine 03/22/2020 NONE DETECTED  NONE DETECTED Final  . Benzodiazepines 03/22/2020 POSITIVE* NONE DETECTED Final  . Amphetamines 03/22/2020 POSITIVE* NONE DETECTED Final  . Tetrahydrocannabinol 03/22/2020 NONE DETECTED  NONE DETECTED Final  . Barbiturates 03/22/2020 NONE DETECTED  NONE DETECTED Final   Comment: (NOTE) DRUG SCREEN FOR MEDICAL PURPOSES ONLY.  IF CONFIRMATION IS NEEDED FOR ANY PURPOSE, NOTIFY LAB WITHIN 5 DAYS.  LOWEST DETECTABLE LIMITS FOR URINE DRUG SCREEN Drug Class                     Cutoff (ng/mL) Amphetamine and metabolites    1000 Barbiturate and metabolites    200 Benzodiazepine                 200 Tricyclics and metabolites     300 Opiates and metabolites        300 Cocaine and metabolites        300 THC                            50 Performed at [facility], [address], [place], [place] [address]   . WBC 03/22/2020 7.3  4.0 - 10.5 K/uL Final  . RBC 03/22/2020 4.98  4.22 - 5.81 MIL/uL Final  .  Hemoglobin 03/22/2020 16.2  13.0 - 17.0 g/dL Final  . HCT 19/14/7829 47.5  39.0 - 52.0 % Final  . MCV 03/22/2020 95.4  80.0 - 100.0 fL Final  . MCH 03/22/2020 32.5  26.0 - 34.0 pg Final  . MCHC 03/22/2020 34.1  30.0 - 36.0 g/dL Final  . RDW 56/21/3086 12.5  11.5 - 15.5 % Final  . Platelets 03/22/2020 191  150 - 400 K/uL Final  . nRBC 03/22/2020 0.0  0.0 - 0.2 % Final  . Neutrophils Relative % 03/22/2020 67  % Final  . Neutro Abs 03/22/2020 4.8  1.7 - 7.7 K/uL Final  . Lymphocytes Relative 03/22/2020 22  % Final  . Lymphs Abs 03/22/2020 1.6  0.7 - 4.0 K/uL Final  . Monocytes Relative 03/22/2020 11  % Final  . Monocytes Absolute 03/22/2020 0.8  0.1 - 1.0 K/uL Final  . Eosinophils Relative 03/22/2020 0  % Final  . Eosinophils Absolute 03/22/2020 0.0  0.0 - 0.5 K/uL Final  . Basophils Relative 03/22/2020 0  % Final  . Basophils Absolute 03/22/2020 0.0  0.0 - 0.1 K/uL Final  . Immature Granulocytes 03/22/2020 0  % Final  . Abs Immature Granulocytes 03/22/2020 0.01  0.00 - 0.07 K/uL Final   Performed at [facility], [address], [place], [place] [address]  . Salicylate Lvl 03/22/2020 <7.0* 7.0 - 30.0 mg/dL Final   Performed at [facility], [address], [place], [place] [address]  . Acetaminophen (Tylenol), Serum 03/22/2020 <10* 10 - 30 ug/mL Final   Comment: (NOTE) Therapeutic concentrations vary significantly. A range of 10-30 ug/mL  may be an effective concentration for many patients. However, some  are best treated at concentrations outside of this range. Acetaminophen concentrations >150 ug/mL at 4 hours after ingestion  and >50 ug/mL at 12 hours after ingestion are often associated with  toxic reactions.  Performed at [facility], [address], [place], [place] [address]   There may be more visits with results that are not included.    Allergies: Patient has no known allergies.  PTA Medications: (Not  in a  hospital admission)   Medical Decision Making  Admit to [facility] for observation and assessment.  Continue home medications      Recommendations  Based on my evaluation the patient appears to have an emergency medical condition for which I recommend the patient be transferred to the emergency department for further evaluation.  Maricela Bo, NP 08/20/20  4:50 AM

## 2020-08-20 NOTE — ED Provider Notes (Signed)
Patient continued to be hyperactive, paranoid, and reporting no sleep for at least 24 hours. 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 Patient was given more medications and went to sleep. Will reevaluate once he wakes up.

## 2020-08-20 NOTE — Telephone Encounter (Signed)
Received message from RN who treated patient with Bicillin in the ED, she states the patient requested that it be administered in his arm. The RN states she gave him one injection in each deltoid.   Sandie Ano, RN

## 2020-08-20 NOTE — ED Triage Notes (Signed)
[employer] transport, pt presents with suicidal ideations, plan to kill self by Death by Cop.  Denies HI or VH.
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
## 2020-08-20 NOTE — Progress Notes (Signed)
Pt continues to sleep. Resps are even and unlabored. No signs of acute distress noted. Pt's safety is maintained.

## 2020-08-21 MED ORDER — LORAZEPAM 1 MG PO TABS
2.0000 mg | ORAL_TABLET | Freq: Once | ORAL | Status: AC
  Administered 2020-08-21: 2 mg via ORAL
  Filled 2020-08-21: qty 2

## 2020-08-21 MED ORDER — LEVETIRACETAM 500 MG PO TABS: 500.0000 mg | ORAL_TABLET | Freq: Two times a day (BID) | ORAL | 0 refills | Status: DC

## 2020-08-21 NOTE — Progress Notes (Signed)
Patient resting quietly, eyes closed respirations even and unlabored at this time. No objective signs of pain or discomfort. Nursing staff will continue to monitor.

## 2020-08-21 NOTE — Patient Outreach (Signed)
[facility] met with Pt an had a facility reach out to [facility] that wanted to screen Pt for services. [facility] processed with Pt about the facility wanting to ask him questions. Pt had began to get frustrated with questions asked an explained that he no longer wants to utilize there services. [facility] processed with Pt an was able to help Pt aware that he has to be willing to not get as frustrated when seeking assistance.

## 2020-08-21 NOTE — ED Provider Notes (Signed)
FBC/OBS ASAP Discharge Summary  Date and Time: 08/21/2020 1:41 PM  Name: Justin [name]  MRN:  [id number]   Discharge Diagnoses:  Final diagnoses:  Substance induced mood disorder (HCC)    Subjective: Patient reports today that he is feeling much better if he is slept almost 24 hours.  Patient at this time denies any suicidal or homicidal ideations and denies any hallucinations.  Patient reports that he wants to go to a program to where he can get off of drugs because he realizes that drugs is what is making him lose everything in his life.  Patient spoke with peers support and they were trying to assist patient with getting into a program however the patient became frustrated with the questions and stated that he did not need their assistance.  Went back to speak with patient about discharging and patient was reluctant for discharge stating that he want to stay another day and he did not know where he was going.  Patient was asking about going to a hotel and was informed that the facility that assisted with hotel placement was closed for the day per peers support.  Patient stated that he need to get to a place where he can get on the Internet to get in touch with his cousin and his cousin will help him pay for a hotel.  Patient agreed to go to the [employer] so that he can use a computer and contact his cousin.  Patient was provided Viibryd with 7-day samples and a prescription of his Keppra 500 mg p.o. twice daily.  Patient was again informed of the open access at the [facility] in the hours of operation.  Stay Summary: Patient is a 40 year old male that presented to the [facility] voluntarily accompanied by law enforcement.  Patient is well-known to the system due to his substance abuse and numerous ED and [facility] visits.  Patient presented to appear manic and tangential and disorganized.  Patient's UDS was positive for methamphetamines and benzodiazepines and patient was admitted to the continuous  observation unit for overnight assessment.  Patient was restarted on his home medications and provided a dose of Zyprexa due to his manic behavior and due to his symptoms of methamphetamine use.  Patient was also provided with Haldol, Ativan, and Benadryl and patient slept for approximately 24 hours.  Patient was reassessed today and denied having any suicidal or homicidal ideations and denied any hallucinations and reporting that he had nowhere to go.  Peer support came and spoke with patient was trying to assist with getting patient into a substance abuse program the patient became frustrated and did not receive the services.  Patient has a plan to have his cousin help him with housing at a hotel and needs to get to a computer where his can get on the Internet to communicate with his cousin.  Patient requested to go to [employer] for Internet access.  Patient was provided with 7-day samples and 30-day prescriptions of his Keppra 500 mg p.o. twice daily and was provided with open access hours at the [facility] again. Patient has had 21 ED visits in the last 6 months  Total Time spent with patient: 30 minutes  Past Psychiatric History: methamphetamine abuse, polysubstance abuse, multiple ED visits Past Medical History:  Past Medical History:  Diagnosis Date  . Endocarditis   . Hepatitis C   . History of syphilis 08/25/2014   Treated by [facility] 07-2014.  [name] HIV (human immunodeficiency  virus infection) (HCC)   . Seizures (HCC)     Past Surgical History:  Procedure Laterality Date  . hand surgery Right   . LUMBAR PUNCTURE  08/08/2019      . WRIST SURGERY     Family History:  Family History  Adopted: Yes  Family history unknown: Yes   Family Psychiatric History: None reported Social History:  Social History   Substance and Sexual Activity  Alcohol Use Yes   Comment: 1-2 a week      Social History   Substance and Sexual Activity  Drug Use Yes  . Frequency: 14.0 times per week  . Types:  Marijuana, Methamphetamines    Social History   Socioeconomic History  . Marital status: Single    Spouse name: Not on file  . Number of children: Not on file  . Years of education: Not on file  . Highest education level: Not on file  Occupational History  . Not on file  Tobacco Use  . Smoking status: Current Every Day Smoker    Types: Cigarettes  . Smokeless tobacco: Never Used  Vaping Use  . Vaping Use: Never used  Substance and Sexual Activity  . Alcohol use: Yes    Comment: 1-2 a week   . Drug use: Yes    Frequency: 14.0 times per week    Types: Marijuana, Methamphetamines  . Sexual activity: Yes    Partners: Male    Birth control/protection: Condom    Comment: given condoms  Other Topics Concern  . Not on file  Social History Narrative   ** Merged History Encounter **       ** Merged History Encounter **       ** Merged History Encounter **       Social Determinants of Corporate investment banker Strain: Not on file  Food Insecurity: Not on file  Transportation Needs: Not on file  Physical Activity: Not on file  Stress: Not on file  Social Connections: Not on file   SDOH:  SDOH Screenings   Alcohol Screen: Not on file  Depression (PHQ2-9): Medium Risk  . PHQ-2 Score: 22  Financial Resource Strain: Not on file  Food Insecurity: Not on file  Housing: Not on file  Physical Activity: Not on file  Social Connections: Not on file  Stress: Not on file  Tobacco Use: High Risk  . Smoking Tobacco Use: Current Every Day Smoker  . Smokeless Tobacco Use: Never Used  Transportation Needs: Not on file    Has this patient used any form of tobacco in the last 30 days? (Cigarettes, Smokeless Tobacco, Cigars, and/or Pipes) A prescription for an FDA-approved tobacco cessation medication was offered at discharge and the patient refused  Current Medications:  Current Facility-Administered Medications  Medication Dose Route Frequency Provider Last Rate Last Admin   . acetaminophen (TYLENOL) tablet 650 mg  650 mg Oral Q6H PRN Nira Conn A, NP      . alum & mag hydroxide-simeth (MAALOX/MYLANTA) 200-200-20 MG/5ML suspension 30 mL  30 mL Oral Q4H PRN Nira Conn A, NP      . bictegravir-emtricitabine-tenofovir AF (BIKTARVY) 50-200-25 MG per tablet 1 tablet  1 tablet Oral Daily Nira Conn A, NP   1 tablet at 08/21/20 0957  . haloperidol lactate (HALDOL) injection 5 mg  5 mg Intramuscular Q6H PRN Nira Conn A, NP       And  . diphenhydrAMINE (BENADRYL) injection 50 mg  50 mg Intramuscular Q6H PRN  Jackelyn Poling, NP       And  . LORazepam (ATIVAN) injection 2 mg  2 mg Intramuscular Q6H PRN Nira Conn A, NP      . hydrOXYzine (ATARAX/VISTARIL) tablet 25 mg  25 mg Oral TID PRN Nira Conn A, NP   25 mg at 08/21/20 0957  . levETIRAcetam (KEPPRA) tablet 500 mg  500 mg Oral BID Nira Conn A, NP   500 mg at 08/21/20 0958  . magnesium hydroxide (MILK OF MAGNESIA) suspension 30 mL  30 mL Oral Daily PRN Nira Conn A, NP      . traZODone (DESYREL) tablet 50 mg  50 mg Oral QHS PRN Ajibola, Ene A, NP   50 mg at 08/21/20 0208   Current Outpatient Medications  Medication Sig Dispense Refill  . bictegravir-emtricitabine-tenofovir AF (BIKTARVY) 50-200-25 MG TABS tablet Take 1 tablet by mouth daily. 30 tablet 5  . levETIRAcetam (KEPPRA) 500 MG tablet Take 1 tablet (500 mg total) by mouth 2 (two) times daily. 60 tablet 0    PTA Medications: (Not in a hospital admission)   Musculoskeletal  Strength & Muscle Tone: within normal limits Gait & Station: normal Patient leans: N/A  Psychiatric Specialty Exam  Presentation  General Appearance: Appropriate for Environment; Casual  Eye Contact:Good  Speech:Clear and Coherent; Normal Rate  Speech Volume:Normal  Handedness:Right   Mood and Affect  Mood:Euthymic  Affect:Appropriate; Congruent   Thought Process  Thought Processes:Coherent  Descriptions of Associations:Intact  Orientation:Full (Time,  Place and Person)  Thought Content:WDL  Hallucinations:Hallucinations: None Description of Auditory Hallucinations: voices that tells him he is flirty, something is going to happen to his family  Ideas of Reference:None  Suicidal Thoughts:Suicidal Thoughts: No SI Active Intent and/or Plan: With Plan  Homicidal Thoughts:Homicidal Thoughts: No HI Passive Intent and/or Plan: Without Plan   Sensorium  Memory:Immediate Good; Recent Good; Remote Good  Judgment:Fair  Insight:Fair   Executive Functions  Concentration:Good  Attention Span:Good  Recall:Good  Fund of Knowledge:Good  Language:Good   Psychomotor Activity  Psychomotor Activity:Psychomotor Activity: Normal   Assets  Assets:Communication Skills; Desire for Improvement; Social Support; Transportation   Sleep  Sleep:Sleep: Good   Physical Exam  Physical Exam Vitals and nursing note reviewed.  Constitutional:      Appearance: He is well-developed.  HENT:     Head: Normocephalic.  Eyes:     Pupils: Pupils are equal, round, and reactive to light.  Cardiovascular:     Rate and Rhythm: Normal rate.  Pulmonary:     Effort: Pulmonary effort is normal.  Musculoskeletal:        General: Normal range of motion.  Neurological:     Mental Status: He is alert and oriented to person, place, and time.    Review of Systems  Constitutional: Negative.   HENT: Negative.   Eyes: Negative.   Respiratory: Negative.   Cardiovascular: Negative.   Gastrointestinal: Negative.   Genitourinary: Negative.   Musculoskeletal: Negative.   Skin: Negative.   Neurological: Negative.   Endo/Heme/Allergies: Negative.   Psychiatric/Behavioral: Positive for substance abuse.   Blood pressure 114/75, pulse 98, temperature 98.1 F (36.7 C), temperature source Oral, resp. rate 18, height 5\' 11"  (1.803 m), weight 195 lb (88.5 kg), SpO2 97 %. Body mass index is 27.2 kg/m.  Demographic Factors:  Male, Low socioeconomic status  and Unemployed  Loss Factors: NA  Historical Factors: NA  Risk Reduction Factors:   Positive social support  Continued Clinical Symptoms:  Alcohol/Substance Abuse/Dependencies  Cognitive Features That Contribute To Risk:  None    Suicide Risk:  Minimal: No identifiable suicidal ideation.  Patients presenting with no risk factors but with morbid ruminations; may be classified as minimal risk based on the severity of the depressive symptoms  Plan Of Care/Follow-up recommendations:  Continue activity as tolerated. Continue diet as recommended by your PCP. Ensure to keep all appointments with outpatient providers.  Disposition: Discharge home  Maryfrances Bunnellravis B Kalissa Grays, FNP 08/21/2020, 1:41 PM
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
## 2020-08-21 NOTE — Progress Notes (Signed)
Patient is alert and oriented, complains of auditory hallucinations, states" everything is fine this morning except for the voices." Patient is pleasant in demeanor this morning, received scheduled medications as well as a vistaril for anxiety. Patient has no pain. Nursing staff will continue to monitor.
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
## 2020-08-21 NOTE — Progress Notes (Signed)
Patient now resting quietly in bed, respirations even and unlabored at this time. Nursing staff will continue to monitor.

## 2020-08-21 NOTE — ED Notes (Signed)
Called safe transport for d/c transportation

## 2020-08-21 NOTE — Discharge Instructions (Signed)

## 2020-08-21 NOTE — ED Notes (Signed)
Pt sleeping@this time. breathing even and unlabored. Will continue to monitor for safety 

## 2020-09-01 ENCOUNTER — Encounter (HOSPITAL_COMMUNITY): Payer: Self-pay | Admitting: Emergency Medicine

## 2020-09-01 ENCOUNTER — Other Ambulatory Visit: Payer: Self-pay

## 2020-09-01 ENCOUNTER — Emergency Department (HOSPITAL_COMMUNITY)
Admission: EM | Admit: 2020-09-01 | Discharge: 2020-09-03 | Disposition: A | Discharge status: Home / Self Care | Payer: Self-pay | Attending: Emergency Medicine | Admitting: Emergency Medicine

## 2020-09-01 DIAGNOSIS — R569 Unspecified convulsions: Secondary | ICD-10-CM

## 2020-09-01 DIAGNOSIS — F1514 Other stimulant abuse with stimulant-induced mood disorder: Secondary | ICD-10-CM | POA: Insufficient documentation

## 2020-09-01 DIAGNOSIS — R45851 Suicidal ideations: Secondary | ICD-10-CM | POA: Insufficient documentation

## 2020-09-01 DIAGNOSIS — F333 Major depressive disorder, recurrent, severe with psychotic symptoms: Secondary | ICD-10-CM | POA: Insufficient documentation

## 2020-09-01 DIAGNOSIS — F17210 Nicotine dependence, cigarettes, uncomplicated: Secondary | ICD-10-CM | POA: Insufficient documentation

## 2020-09-01 DIAGNOSIS — F1594 Other stimulant use, unspecified with stimulant-induced mood disorder: Secondary | ICD-10-CM | POA: Diagnosis present

## 2020-09-01 DIAGNOSIS — R443 Hallucinations, unspecified: Secondary | ICD-10-CM

## 2020-09-01 DIAGNOSIS — F22 Delusional disorders: Secondary | ICD-10-CM

## 2020-09-01 DIAGNOSIS — F1910 Other psychoactive substance abuse, uncomplicated: Secondary | ICD-10-CM | POA: Insufficient documentation

## 2020-09-01 DIAGNOSIS — Z21 Asymptomatic human immunodeficiency virus [HIV] infection status: Secondary | ICD-10-CM | POA: Insufficient documentation

## 2020-09-01 DIAGNOSIS — Z20822 Contact with and (suspected) exposure to covid-19: Secondary | ICD-10-CM | POA: Insufficient documentation

## 2020-09-01 LAB — CBC
HCT: 49.3 % (ref 39.0–52.0)
Hemoglobin: 16.3 g/dL (ref 13.0–17.0)
MCH: 32.7 pg (ref 26.0–34.0)
MCHC: 33.1 g/dL (ref 30.0–36.0)
MCV: 98.8 fL (ref 80.0–100.0)
Platelets: 225 10*3/uL (ref 150–400)
RBC: 4.99 MIL/uL (ref 4.22–5.81)
RDW: 12.5 % (ref 11.5–15.5)
WBC: 8.5 10*3/uL (ref 4.0–10.5)
nRBC: 0 % (ref 0.0–0.2)

## 2020-09-01 LAB — COMPREHENSIVE METABOLIC PANEL
ALT: 37 U/L (ref 0–44)
AST: 63 U/L — ABNORMAL HIGH (ref 15–41)
Albumin: 4.5 g/dL (ref 3.5–5.0)
Alkaline Phosphatase: 81 U/L (ref 38–126)
Anion gap: 13 (ref 5–15)
BUN: 11 mg/dL (ref 6–20)
CO2: 27 mmol/L (ref 22–32)
Calcium: 9.7 mg/dL (ref 8.9–10.3)
Chloride: 100 mmol/L (ref 98–111)
Creatinine, Ser: 1.27 mg/dL — ABNORMAL HIGH (ref 0.61–1.24)
GFR, Estimated: >60 mL/min (ref >60)
Glucose, Bld: 100 mg/dL — ABNORMAL HIGH (ref 70–99)
Potassium: 3.4 mmol/L — ABNORMAL LOW (ref 3.5–5.1)
Sodium: 140 mmol/L (ref 135–145)
Total Bilirubin: 2.3 mg/dL — ABNORMAL HIGH (ref 0.3–1.2)
Total Protein: 7.8 g/dL (ref 6.5–8.1)

## 2020-09-01 LAB — SALICYLATE LEVEL: Salicylate Lvl: <7 mg/dL — ABNORMAL LOW (ref 7.0–30.0)

## 2020-09-01 LAB — ACETAMINOPHEN LEVEL: Acetaminophen (Tylenol), Serum: <10 ug/mL — ABNORMAL LOW (ref 10–30)

## 2020-09-01 LAB — ETHANOL: Alcohol, Ethyl (B): <10 mg/dL (ref <10)

## 2020-09-01 MED ORDER — LORAZEPAM 1 MG PO TABS
2.0000 mg | ORAL_TABLET | Freq: Once | ORAL | Status: AC
  Administered 2020-09-02: 2 mg via ORAL
  Filled 2020-09-01: qty 2
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
## 2020-09-01 NOTE — ED Notes (Signed)
[employer] officer leaving to get patient IVCd.

## 2020-09-02 ENCOUNTER — Telehealth: Payer: Self-pay

## 2020-09-02 LAB — RAPID URINE DRUG SCREEN, HOSP PERFORMED
Amphetamines: POSITIVE — AB
Barbiturates: NOT DETECTED
Benzodiazepines: NOT DETECTED
Cocaine: NOT DETECTED
Opiates: NOT DETECTED
Tetrahydrocannabinol: POSITIVE — AB

## 2020-09-02 LAB — RESP PANEL BY RT-PCR (FLU A&B, COVID) ARPGX2
Influenza A by PCR: NEGATIVE
Influenza B by PCR: NEGATIVE
SARS Coronavirus 2 by RT PCR: NEGATIVE

## 2020-09-02 MED ORDER — LORAZEPAM 1 MG PO TABS
2.0000 mg | ORAL_TABLET | Freq: Once | ORAL | Status: AC
  Administered 2020-09-02: 2 mg via ORAL
  Filled 2020-09-02: qty 2

## 2020-09-02 MED ORDER — LORAZEPAM 1 MG PO TABS
2.0000 mg | ORAL_TABLET | Freq: Two times a day (BID) | ORAL | Status: DC | PRN
  Administered 2020-09-03: 2 mg via ORAL
  Filled 2020-09-02: qty 2

## 2020-09-02 MED ORDER — LORAZEPAM 2 MG/ML IJ SOLN: 2.0000 mg | Freq: Two times a day (BID) | INTRAMUSCULAR | Status: DC | PRN

## 2020-09-02 MED ORDER — BICTEGRAVIR-EMTRICITAB-TENOFOV 50-200-25 MG PO TABS
1.0000 | ORAL_TABLET | Freq: Every day | ORAL | Status: DC
  Administered 2020-09-02 – 2020-09-03 (×2): 1 via ORAL
  Filled 2020-09-02 (×4): qty 1

## 2020-09-02 MED ORDER — ZIPRASIDONE MESYLATE 20 MG IM SOLR: 20.0000 mg | Freq: Every day | INTRAMUSCULAR | Status: DC | PRN

## 2020-09-02 MED ORDER — OLANZAPINE 5 MG PO TBDP
10.0000 mg | ORAL_TABLET | Freq: Once | ORAL | Status: DC
  Filled 2020-09-02: qty 2

## 2020-09-02 MED ORDER — ZIPRASIDONE MESYLATE 20 MG IM SOLR
20.0000 mg | Freq: Once | INTRAMUSCULAR | Status: AC
  Administered 2020-09-02: 20 mg via INTRAMUSCULAR
  Filled 2020-09-02: qty 20

## 2020-09-02 MED ORDER — LORAZEPAM 2 MG/ML IJ SOLN: 2.0000 mg | Freq: Once | INTRAMUSCULAR | Status: AC

## 2020-09-02 MED ORDER — LEVETIRACETAM 500 MG PO TABS
500.0000 mg | ORAL_TABLET | Freq: Two times a day (BID) | ORAL | Status: DC
Start: 2020-09-02 — End: 2020-09-03
  Administered 2020-09-02 – 2020-09-03 (×3): 500 mg via ORAL
  Filled 2020-09-02 (×3): qty 1

## 2020-09-02 MED ORDER — RISPERIDONE 2 MG PO TBDP
2.0000 mg | ORAL_TABLET | Freq: Two times a day (BID) | ORAL | Status: DC
  Administered 2020-09-02 – 2020-09-03 (×3): 2 mg via ORAL
  Filled 2020-09-02 (×3): qty 1

## 2020-09-02 MED ORDER — OLANZAPINE 5 MG PO TBDP
10.0000 mg | ORAL_TABLET | Freq: Three times a day (TID) | ORAL | Status: DC | PRN
  Administered 2020-09-02: 10 mg via ORAL
  Filled 2020-09-02: qty 2

## 2020-09-02 NOTE — ED Notes (Signed)
Pt sleeping. 

## 2020-09-02 NOTE — ED Notes (Signed)
TC to [facility]  And spoke with intake to request that Pt be started on scheduled meds  to help with hallucinations.

## 2020-09-02 NOTE — ED Notes (Signed)
Pt wanded by security and belongings secured
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
## 2020-09-02 NOTE — ED Notes (Signed)
Lunch Ordered @ 1000. °

## 2020-09-02 NOTE — ED Notes (Signed)
Pt continues to kneel and stare at ceiling. Pt redirected to sit on bed.

## 2020-09-02 NOTE — BH Assessment (Signed)
TTS attempted to assess patient, but he was sedated earlier and is now sleeping.

## 2020-09-02 NOTE — ED Provider Notes (Signed)
[facility]EM[facility] Emergency Department Provider Note MRN:  [id number]  Arrival date & time: 09/02/20     Chief Complaint   Hallucinations   History of Present Illness   Justin [name] is a 40 y.o. year-old male with a history of HIV presenting to the ED with chief complaint of hallucinations.  Patient states that he is having issues with PTSD.  He is acting like people are working for him, he repeats that his family was killed today.  Police say this is not true.  He reportedly was hallucinating that 12 people were trying to kill him today.  Reportedly some methamphetamine use today.  I was unable to obtain an accurate HPI, PMH, or ROS due to the patient's unstable psychiatric condition.  Level 5 caveat.  Review of Systems  Positive for paranoid delusions. Patient's Health History    Past Medical History:  Diagnosis Date  . Endocarditis   . Hepatitis C   . History of syphilis 08/25/2014   Treated by [facility] 07-2014.  [name] HIV (human immunodeficiency virus infection) (HCC)   . Seizures (HCC)     Past Surgical History:  Procedure Laterality Date  . hand surgery Right   . LUMBAR PUNCTURE  08/08/2019      . WRIST SURGERY      Family History  Adopted: Yes  Family history unknown: Yes    Social History   Socioeconomic History  . Marital status: Single    Spouse name: Not on file  . Number of children: Not on file  . Years of education: Not on file  . Highest education level: Not on file  Occupational History  . Not on file  Tobacco Use  . Smoking status: Current Every Day Smoker    Types: Cigarettes  . Smokeless tobacco: Never Used  Vaping Use  . Vaping Use: Never used  Substance and Sexual Activity  . Alcohol use: Yes    Comment: 1-2 a week   . Drug use: Yes    Frequency: 14.0 times per week    Types: Marijuana, Methamphetamines  . Sexual activity: Yes    Partners: Male    Birth control/protection: Condom    Comment: given condoms  Other Topics  Concern  . Not on file  Social History Narrative   ** Merged History Encounter **       ** Merged History Encounter **       ** Merged History Encounter **       Social Determinants of Corporate investment banker Strain: Not on file  Food Insecurity: Not on file  Transportation Needs: Not on file  Physical Activity: Not on file  Stress: Not on file  Social Connections: Not on file  Intimate Partner Violence: Not on file     Physical Exam   Vitals:   09/01/20 2215  BP: (!) 143/99  Pulse: 87  Resp: 16  Temp: 98 F (36.7 C)  SpO2: 99%    CONSTITUTIONAL: Chronically ill-appearing, NAD NEURO:  Alert and oriented x 3, no focal deficits EYES:  eyes equal and reactive ENT/NECK:  no LAD, no JVD CARDIO: Regular rate, well-perfused, normal S1 and S2 PULM:  CTAB no wheezing or rhonchi GI/GU:  normal bowel sounds, non-distended, non-tender MSK/SPINE:  No gross deformities, no edema SKIN:  no rash, atraumatic PSYCH: Paranoid speech and behavior  *Additional and/or pertinent findings included in MDM below  Diagnostic and Interventional Summary    EKG Interpretation  Date/Time:  Ventricular Rate:    PR Interval:    QRS Duration:   QT Interval:    QTC Calculation:   R Axis:     Text Interpretation:        Labs Reviewed  COMPREHENSIVE METABOLIC PANEL - Abnormal; Notable for the following components:      Result Value   Potassium 3.4 (*)    Glucose, Bld 100 (*)    Creatinine, Ser 1.27 (*)    AST 63 (*)    Total Bilirubin 2.3 (*)    All other components within normal limits  SALICYLATE LEVEL - Abnormal; Notable for the following components:   Salicylate Lvl <7.0 (*)    All other components within normal limits  ACETAMINOPHEN LEVEL - Abnormal; Notable for the following components:   Acetaminophen (Tylenol), Serum <10 (*)    All other components within normal limits  RAPID URINE DRUG SCREEN, HOSP PERFORMED - Abnormal; Notable for the following components:    Amphetamines POSITIVE (*)    Tetrahydrocannabinol POSITIVE (*)    All other components within normal limits  SARS CORONAVIRUS 2 (TAT 6-24 HRS)  RESP PANEL BY RT-PCR (FLU A&B, COVID) ARPGX2  ETHANOL  CBC    No orders to display    Medications  ziprasidone (GEODON) injection 20 mg (has no administration in time range)  LORazepam (ATIVAN) tablet 2 mg (2 mg Oral Given 09/02/20 0012)     Procedures  /  Critical Care .Critical Care Performed by: Sabas Sous, MD Authorized by: Sabas Sous, MD   Critical care provider statement:    Critical care time (minutes):  32   Critical care was necessary to treat or prevent imminent or life-threatening deterioration of the following conditions: Unstable psychiatric condition.   Critical care was time spent personally by me on the following activities:  Discussions with consultants, evaluation of patient's response to treatment, examination of patient, ordering and performing treatments and interventions, ordering and review of laboratory studies, ordering and review of radiographic studies, pulse oximetry, re-evaluation of patient's condition, obtaining history from patient or surrogate and review of old charts    ED Course and Medical Decision Making  I have reviewed the triage vital signs, the nursing notes, and pertinent available records from the EMR.  Listed above are laboratory and imaging tests that I personally ordered, reviewed, and interpreted and then considered in my medical decision making (see below for details).  Paranoid delusions, substance abuse, providing Ativan, seems to be on the brink of violence, IVC paperwork initiated.  Patient is medically cleared, will consult TTS.     Patient requiring escalating medical management for paranoia and agitation.  Elmer Sow. Pilar Plate, MD [facility] Emergency Medicine [facility] [email or web address]  Final Clinical Impressions(s) / ED Diagnoses     ICD-10-CM   1.  Substance abuse (HCC)  F19.10   2. Hallucinations  R44.3   3. Paranoia (HCC)  F22     ED Discharge Orders    None       Discharge Instructions Discussed with and Provided to Patient:   Discharge Instructions   None       Sabas Sous, MD 09/02/20 [phone]

## 2020-09-02 NOTE — ED Notes (Signed)
Pt

## 2020-09-02 NOTE — BH Assessment (Signed)
Disposition: Per Marciano Sequin, NP, patient meets criteria for admission to the [facility]. However, patient declined by Reola Calkins, NP due to history of acuity. Therefore, patient would not be appropriate for the milieu at the [facility]. Per Marciano Sequin, NP, patient to remain in the ED for overnight for stabilization. Pending am psych evaluation. Patient's nurse provided updates.
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
## 2020-09-02 NOTE — ED Notes (Signed)
Pt out in hall talking to people who are not there. Pt  Pointing  and snapping his fingers at the imaginary person.

## 2020-09-02 NOTE — BH Assessment (Signed)
Disposition: Per Marciano Sequin, NP recommended for admission to the [facility] for overnight observation. Pending am psych evaluation. Awaiting approval from [place] regarding this transfer.

## 2020-09-02 NOTE — BH Assessment (Addendum)
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 You Been Recently Discharged From Any Office Practice or Programs? No   Explanation of Discharge From Practice/Program:  No data recorded    [facility] Screening Triage Referral Assessment Type of Contact: Face-to-Face   Is this Initial or Reassessment? Initial Assessment   Date Telepsych consult ordered in [facility]:  04/22/2020   Time Telepsych consult ordered in [facility]:  1102  Patient Reported Information Reviewed? Yes   Patient Left Without Being Seen? No data recorded  Reason for Not Completing Assessment: No data recorded Collateral Involvement: Pt declined to provide verbal consent for clinician to make contact with family or friends, stating he has no supports.  Does Patient Have a Automotive engineer Guardian? No data recorded  Name and Contact of Legal Guardian:  Self  If Minor and Not Living with Parent(s), Who has Custody? N/A  Is CPS involved or ever been involved? Never  Is APS involved or  ever been involved? Never  Patient Determined To Be At Risk for Harm To Self or Others Based on Review of Patient Reported Information or Presenting Complaint? No  Method: Plan without intent (Get hit by a car or suicide by cop.)   Availability of Means: Has close by [employer] and police)   Intent: Vague intent or [name]   Notification Required: No need or identified person   Additional Information for Danger to Others Potential:  No data recorded  Additional Comments for Danger to Others Potential:  No data recorded  Are There Guns or Other Weapons in Your Home?  No    Types of Guns/Weapons: No data recorded   Are These Weapons Safely Secured?                              No data recorded   Who Could Verify You Are Able To Have These Secured:    No data recorded Do You Have any Outstanding Charges, Pending Court Dates, Parole/Probation? Trespassing/DWI/Assault on a Governmental Official 04/30/2020  Contacted To Inform of Risk of Harm To Self or Others: Patent examiner (The police are aware of pt's threat to self)  Location of Assessment: [facility] ED  Does Patient Present under Involuntary Commitment? No   IVC Papers Initial File Date: 07/04/2020   [place] of Residence: [place]  Patient Currently Receiving the Following Services: -- (patient denies that he has any services in place)   Determination of Need: Emergent (2 hours)   Options For Referral: Medication Management; Inpatient Hospitalization; [facility]   Justin [name], Counselor

## 2020-09-02 NOTE — ED Notes (Signed)
Pt is now sitting on his knees on the bed talking to the ceiling.

## 2020-09-02 NOTE — ED Notes (Signed)
Breakfast Ordered 

## 2020-09-02 NOTE — ED Notes (Signed)
Pt continues to talk out loud to himself . Pt has also been standing up on bed and was witnessed by sitter trying to push open ceiling tiles. Secure chat sent to [facility] to request medication to calm Pt.

## 2020-09-02 NOTE — Telephone Encounter (Signed)
Received call today from [facility] with [facility] requesting contact information for patient. Patient does not have an active number listed chart and is currently homeless. DIS needs to contact patient for contact tracing.  Informed Shon Baton patient has an upcoming appointment with ID on 3/8. Is currently admitted at [facility] hospital. Requested she call back after appointment for updated contact info.

## 2020-09-03 ENCOUNTER — Other Ambulatory Visit: Payer: Self-pay

## 2020-09-03 DIAGNOSIS — F1594 Other stimulant use, unspecified with stimulant-induced mood disorder: Secondary | ICD-10-CM

## 2020-09-03 NOTE — ED Notes (Signed)
Pt belongings inventoried and put in locker #3. Pt valuable with security.

## 2020-09-03 NOTE — ED Notes (Signed)
TTS done 

## 2020-09-03 NOTE — ED Notes (Signed)
Pt provided with [employer]

## 2020-09-03 NOTE — ED Provider Notes (Addendum)
Emergency Medicine Observation Re-evaluation Note  Justin [name] is a 40 y.o. male, seen on rounds today.  Pt initially presented to the ED for complaints of Hallucinations Currently, the patient is resting comfortably in bed.  Physical Exam  BP 111/72   Pulse 84   Temp 98.5 F (36.9 C) (Oral)   Resp 18   SpO2 99%  Physical Exam General: Calm at this time Cardiac: Normal heart Lungs: No respiratory Psych: Not currently internally responsive  ED Course / MDM  EKG:    I have reviewed the labs performed to date as well as medications administered while in observation.  Recent changes in the last 24 hours include responding to medications given.  Plan  Current plan is for psychiatric hospitalization. Patient is under full IVC at this time.   Mancel Bale, MD 09/03/20 1024  He has been no seen by psychiatry, this morning, and medically cleared.  At this time he is alert and conversant.  He is mildly anxious.  He thinks that he had a seizure prior to coming here.  He states that he has had seizures previously, been referred to neurology but never been seen by them.  He states he was "clean for 63 days," before using methamphetamine yesterday.  He admits to stress because of the recent death and problems with his home life.  He denies suicidal ideation or plan.  He has no homicidal thoughts.  He apparently declined referral to psychiatric services when he talked to the psychiatric NP.  I will refer him to neurology to be evaluated for seizures.  Patient is appreciative, kind and friendly.  No indication for further ED evaluation or intervention at this time.  His IVC was rescinded by me.     Mancel Bale, MD 09/03/20 1159

## 2020-09-03 NOTE — Discharge Instructions (Signed)
Try to avoid using illegal drugs.  These can aggravate problems including seizures.  Consider seeing a therapist for psychiatric care.  Call the neurology office for follow-up appointment to be seen about seizures.  We sent a referral to them, to assist you with getting in for an appointment.
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
## 2020-09-06 ENCOUNTER — Other Ambulatory Visit: Payer: Self-pay

## 2020-09-06 ENCOUNTER — Emergency Department (HOSPITAL_COMMUNITY)
Admission: EM | Admit: 2020-09-06 | Discharge: 2020-09-08 | Disposition: A | Discharge status: Home / Self Care | Payer: Self-pay | Attending: Emergency Medicine | Admitting: Emergency Medicine

## 2020-09-06 DIAGNOSIS — F17210 Nicotine dependence, cigarettes, uncomplicated: Secondary | ICD-10-CM | POA: Insufficient documentation

## 2020-09-06 DIAGNOSIS — F1520 Other stimulant dependence, uncomplicated: Secondary | ICD-10-CM | POA: Diagnosis present

## 2020-09-06 DIAGNOSIS — R443 Hallucinations, unspecified: Secondary | ICD-10-CM | POA: Insufficient documentation

## 2020-09-06 DIAGNOSIS — F323 Major depressive disorder, single episode, severe with psychotic features: Secondary | ICD-10-CM | POA: Diagnosis present

## 2020-09-06 DIAGNOSIS — Z21 Asymptomatic human immunodeficiency virus [HIV] infection status: Secondary | ICD-10-CM | POA: Insufficient documentation

## 2020-09-06 DIAGNOSIS — F1990 Other psychoactive substance use, unspecified, uncomplicated: Secondary | ICD-10-CM | POA: Diagnosis present

## 2020-09-06 DIAGNOSIS — R45851 Suicidal ideations: Secondary | ICD-10-CM

## 2020-09-06 DIAGNOSIS — Z79899 Other long term (current) drug therapy: Secondary | ICD-10-CM | POA: Insufficient documentation

## 2020-09-06 DIAGNOSIS — F1994 Other psychoactive substance use, unspecified with psychoactive substance-induced mood disorder: Secondary | ICD-10-CM | POA: Diagnosis present

## 2020-09-06 DIAGNOSIS — Z5900 Homelessness unspecified: Secondary | ICD-10-CM | POA: Insufficient documentation

## 2020-09-06 DIAGNOSIS — R569 Unspecified convulsions: Secondary | ICD-10-CM | POA: Insufficient documentation

## 2020-09-06 DIAGNOSIS — Z20822 Contact with and (suspected) exposure to covid-19: Secondary | ICD-10-CM | POA: Insufficient documentation

## 2020-09-06 MED ORDER — BICTEGRAVIR-EMTRICITAB-TENOFOV 50-200-25 MG PO TABS
1.0000 | ORAL_TABLET | Freq: Every day | ORAL | Status: DC
  Administered 2020-09-07 – 2020-09-08 (×2): 1 via ORAL
  Filled 2020-09-06 (×2): qty 1

## 2020-09-06 MED ORDER — LEVETIRACETAM 500 MG PO TABS
500.0000 mg | ORAL_TABLET | Freq: Two times a day (BID) | ORAL | Status: DC
  Administered 2020-09-06 – 2020-09-08 (×4): 500 mg via ORAL
  Filled 2020-09-06 (×4): qty 1

## 2020-09-06 NOTE — ED Triage Notes (Signed)
Pt called EMS because he had a seizure, EMS found him in a postictal state, from which he recovered quickly. Pt wants to be evaluated for a fall through his attic ceiling yesterday during which he hurt his head and back. No lacerations, tenderness in lower left back.

## 2020-09-06 NOTE — ED Provider Notes (Signed)
[facility] Note   CSN: [id number] Arrival date & time: 09/06/20  1953     History No chief complaint on file.   Justin [name] is a 40 y.o. male.  HPI Patient presents by EMS for evaluation of seizure with brief postictal state.  Patient then reported to EMS that he wanted to be evaluated for falling through the ceiling, from the attic to the floor.  He states he hurt his head and neck.  When asked the patient about this he stated he could not remember falling through the ceiling.  He admits to using methamphetamine after leaving the hospital 3 days ago.  He had been in the ED for evaluation of substance-induced  psychosis.  At this time the patient states he has thought about suicide but does not have a plan.  He has frequent admissions to the ED, and behavioral health urgent care, with similar symptoms.  He states he needs a place to stay.  He has previously complained of seizures however it is not clear that he is actually having seizures.  He has psychosocial stressors with family difficulties.  He has chronic intermittent delusions and frequently changes his complaints.  His chronic substance abuse including alcohol and methamphetamine.  Denies fever, cough, weakness or dizziness.  There are no other known  modifying factors.    Past Medical History:  Diagnosis Date  . Endocarditis   . Hepatitis C   . History of syphilis 08/25/2014   Treated by [facility] 07-2014.  [name] HIV (human immunodeficiency virus infection) (HCC)   . Seizures [facility])     Patient Active Problem List   Diagnosis Date Noted  . Suicidal ideations 07/08/2020  . 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HCC)   . Seizure (HCC) 06/26/2020  . Seizure-like activity (HCC) 06/25/2020  . Amphetamine and psychostimulant-induced psychosis with delusions (HCC)   . 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 Acute  psychosis (HCC)   . Substance use disorder 08/07/2019  . Encephalopathy 08/06/2019  . Chronic hepatitis C without hepatic coma (HCC) 08/17/2017  . Screening examination for venereal disease 05/03/2017  . Encounter for long-term (current) use of high-risk medication 05/03/2017  . Anxiety 09/26/2016  . Substance abuse (HCC) 09/26/2016  . Human immunodeficiency virus (HIV) disease (HCC) 09/02/2014  . History of syphilis 08/25/2014    Past Surgical History:  Procedure Laterality Date  . hand surgery Right   . LUMBAR PUNCTURE  08/08/2019      . WRIST SURGERY         Family History  Adopted: Yes  Family history unknown: Yes    Social History   Tobacco Use  . Smoking status: Current Every Day Smoker    Types: Cigarettes  . Smokeless tobacco: Never Used  Vaping Use  . Vaping Use: Never used  Substance Use Topics  . Alcohol use: Yes    Comment: 1-2 a week   . Drug use: Yes    Frequency: 14.0 times per week    Types: Marijuana, Methamphetamines    Home Medications Prior to Admission medications   Medication Sig Start Date End Date Taking? Authorizing Provider  bictegravir-emtricitabine-tenofovir AF (BIKTARVY) 50-200-25 MG TABS tablet Take 1 tablet by mouth daily. 07/16/20   [name], MD  levETIRAcetam (KEPPRA) 500 MG tablet Take 1 tablet (500 mg total) by mouth 2 (two) times daily. 08/21/20   Money, Gerlene Burdock, FNP    Allergies  Patient has no known allergies.  Review of Systems   Review of Systems  All other systems reviewed and are negative.   Physical Exam Updated Vital Signs BP 131/84 (BP Location: Right Arm)   Pulse 73   Temp 97.8 F (36.6 C) (Oral)   Resp 17   Ht 6' (1.829 m)   Wt 90.7 kg   SpO2 100%   BMI 27.12 kg/m   Physical Exam Vitals and nursing note reviewed.  Constitutional:      General: He is not in acute distress.    Appearance: He is well-developed and well-nourished. He is not ill-appearing, toxic-appearing or diaphoretic.  HENT:      Head: Normocephalic and atraumatic.     Right Ear: External ear normal.     Left Ear: External ear normal.  Eyes:     Extraocular Movements: EOM normal.     Conjunctiva/sclera: Conjunctivae normal.     Pupils: Pupils are equal, round, and reactive to light.  Neck:     Trachea: Phonation normal.  Cardiovascular:     Rate and Rhythm: Normal rate.  Pulmonary:     Effort: Pulmonary effort is normal.  Chest:     Chest wall: No bony tenderness.  Abdominal:     General: There is no distension.     Tenderness: There is no abdominal tenderness.  Musculoskeletal:        General: Normal range of motion.     Cervical back: Normal range of motion and neck supple.  Skin:    General: Skin is warm, dry and intact.     Coloration: Skin is not jaundiced or pale.  Neurological:     Mental Status: He is alert and oriented to person, place, and time.     Cranial Nerves: No cranial nerve deficit.     Motor: No abnormal muscle tone.     Coordination: Coordination normal.  Psychiatric:        Mood and Affect: Mood and affect and mood normal.        Behavior: Behavior normal.     Comments: He is not responding to internal stimuli.     ED Results / Procedures / Treatments   Labs (all labs ordered are listed, but only abnormal results are displayed) Labs Reviewed - No data to display  EKG None  Radiology No results found.  Procedures Procedures   Medications Ordered in ED Medications  bictegravir-emtricitabine-tenofovir AF (BIKTARVY) 50-200-25 MG per tablet 1 tablet (has no administration in time range)  levETIRAcetam (KEPPRA) tablet 500 mg (has no administration in time range)    ED Course  I have reviewed the triage vital signs and the nursing notes.  Pertinent labs & imaging results that were available during my care of the patient were reviewed by me and considered in my medical decision making (see chart for details).    MDM Rules/Calculators/A&P                            Patient Vitals for the past 24 hrs:  BP Temp Temp src Pulse Resp SpO2 Height Weight  09/06/20 2010 131/84 97.8 F (36.6 C) Oral 73 17 100 % -- --  09/06/20 2003 -- -- -- -- -- -- 6' (1.829 m) 90.7 kg    10:56 PM Reevaluation with update and discussion. After initial assessment and treatment, an updated evaluation reveals he is resting comfortably.  Offered to send in the shelter and he  did not want to go.  Awaiting TOC consultation. Mancel Bale   Medical Decision Making:  This patient is presenting for evaluation of homelessness and substance abuse, which does not require a range of treatment options, and is not a complaint that involves a high risk of morbidity and mortality.  Critical Interventions-clinical evaluation, discussion with patient  After These Interventions, the Patient was reevaluated and was found to require assistance by transitions of care team  CRITICAL CARE-no Performed by: Mancel Bale  Nursing Notes Reviewed/ Care Coordinated Applicable Imaging Reviewed Interpretation of Laboratory Data incorporated into ED treatment      Final Clinical Impression(s) / ED Diagnoses Final diagnoses:  Homelessness    Rx / DC Orders ED Discharge Orders    None       Mancel Bale, MD 09/06/20 2258

## 2020-09-07 DIAGNOSIS — F323 Major depressive disorder, single episode, severe with psychotic features: Secondary | ICD-10-CM | POA: Diagnosis present

## 2020-09-07 LAB — RESP PANEL BY RT-PCR (FLU A&B, COVID) ARPGX2
Influenza A by PCR: NEGATIVE
Influenza B by PCR: NEGATIVE
SARS Coronavirus 2 by RT PCR: NEGATIVE

## 2020-09-07 MED ORDER — NICOTINE 21 MG/24HR TD PT24
22.0000 mg | MEDICATED_PATCH | Freq: Once | TRANSDERMAL | Status: AC
  Administered 2020-09-07: 21 mg via TRANSDERMAL
  Filled 2020-09-07: qty 1

## 2020-09-07 MED ORDER — STERILE WATER FOR INJECTION IJ SOLN
INTRAMUSCULAR | Status: AC
  Filled 2020-09-07: qty 10

## 2020-09-07 MED ORDER — LORAZEPAM 1 MG PO TABS
1.0000 mg | ORAL_TABLET | Freq: Once | ORAL | Status: AC
  Administered 2020-09-07: 1 mg via ORAL
  Filled 2020-09-07: qty 1

## 2020-09-07 MED ORDER — ZIPRASIDONE MESYLATE 20 MG IM SOLR
INTRAMUSCULAR | Status: AC
  Administered 2020-09-07: 10 mg
  Filled 2020-09-07: qty 20

## 2020-09-07 MED ORDER — LORAZEPAM 2 MG/ML IJ SOLN
2.0000 mg | Freq: Once | INTRAMUSCULAR | Status: AC
  Administered 2020-09-07: 2 mg via INTRAMUSCULAR
  Filled 2020-09-07: qty 1

## 2020-09-07 NOTE — Progress Notes (Addendum)
TOC CM/CSW went to consult with pt when CSW discovered that nurse found pt with blood pressure cuff around pts neck, then thrown to the floor, pt then disclosed that he was going to kill himself. Pt is still expressing SI.  CSW discussed situation with EDP and has agreed that a psych reevaluation is necessary.  Please reconsult if new social work issues arise, CSW signing off.  Elba Schaber Tarpley-Carter, MSW, LCSW-A Pronouns:  She, Her, Hers                  [facility] ED Transitions of CareClinical Social Worker [email or web address] [phone]

## 2020-09-07 NOTE — ED Notes (Signed)
On admission to TCU pt is calm and cooperative.  Went to sleep.

## 2020-09-07 NOTE — ED Notes (Addendum)
Per charge RN, pt safety would be best maintained by placing pt in hallway bed. Pt informed of bed placement change, pt became agitated and expressed wish to leave. Pt refused to sign AMA form. Particia Nearing, MD occupied with other patient care at the time, IVC decision was made. pt left the premises. Police and security aware of situation. Charge RN and ED supervisor aware as well.

## 2020-09-07 NOTE — ED Notes (Signed)
Verbal order from Estell Harpin, MD, to give 10mg  of IM geodon

## 2020-09-07 NOTE — ED Notes (Signed)
Pt cooperative. Provided warm blanket.

## 2020-09-07 NOTE — ED Notes (Signed)
Pt walked back into ED with security and law enforcement

## 2020-09-07 NOTE — ED Notes (Signed)
Gave bedside report to Sedalia Muta, RN

## 2020-09-07 NOTE — ED Notes (Signed)
Pt states "I just want to let you know that I tried to hang myself earlier and when I get out of here I plan to kill myself". Suicide screening updated and MD made aware
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
## 2020-09-07 NOTE — ED Provider Notes (Signed)
Pt d/w pt's nurse and with SW.  Pt expressed that he wants to kill himself.  He was found by the NT with the bp cuff around his neck.  TTS will be consulted.   Jacalyn Lefevre, MD 09/07/20 [phone]
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
## 2020-09-07 NOTE — ED Notes (Signed)
All VS cords removed from room for pt safety due to expressed SI

## 2020-09-07 NOTE — ED Notes (Signed)
Pt asked if he could smoke a cigarette. Pt became agitated when he was informed that he is not allowed to smoke a cigarette. Pt ran out of his room and security was called to get pt back to his room. Pt was non-compliant with security and staff, and Zammitt, MD, gave a verbal order for 10mg  of Geodon IM.

## 2020-09-08 ENCOUNTER — Encounter (HOSPITAL_COMMUNITY): Payer: Self-pay | Admitting: Registered Nurse

## 2020-09-08 ENCOUNTER — Ambulatory Visit: Payer: Self-pay | Admitting: Internal Medicine

## 2020-09-08 DIAGNOSIS — F1994 Other psychoactive substance use, unspecified with psychoactive substance-induced mood disorder: Secondary | ICD-10-CM | POA: Diagnosis present

## 2020-09-08 LAB — CBC WITH DIFFERENTIAL/PLATELET
Abs Immature Granulocytes: 0.01 10*3/uL (ref 0.00–0.07)
Basophils Absolute: 0 10*3/uL (ref 0.0–0.1)
Basophils Relative: 0 %
Eosinophils Absolute: 0.1 10*3/uL (ref 0.0–0.5)
Eosinophils Relative: 1 %
HCT: 49.2 % (ref 39.0–52.0)
Hemoglobin: 16.2 g/dL (ref 13.0–17.0)
Immature Granulocytes: 0 %
Lymphocytes Relative: 44 %
Lymphs Abs: 2 10*3/uL (ref 0.7–4.0)
MCH: 32.7 pg (ref 26.0–34.0)
MCHC: 32.9 g/dL (ref 30.0–36.0)
MCV: 99.4 fL (ref 80.0–100.0)
Monocytes Absolute: 0.4 10*3/uL (ref 0.1–1.0)
Monocytes Relative: 10 %
Neutro Abs: 2.1 10*3/uL (ref 1.7–7.7)
Neutrophils Relative %: 45 %
Platelets: 179 10*3/uL (ref 150–400)
RBC: 4.95 MIL/uL (ref 4.22–5.81)
RDW: 12 % (ref 11.5–15.5)
WBC: 4.6 10*3/uL (ref 4.0–10.5)
nRBC: 0 % (ref 0.0–0.2)

## 2020-09-08 LAB — COMPREHENSIVE METABOLIC PANEL
ALT: 27 U/L (ref 0–44)
AST: 33 U/L (ref 15–41)
Albumin: 4.2 g/dL (ref 3.5–5.0)
Alkaline Phosphatase: 84 U/L (ref 38–126)
Anion gap: 7 (ref 5–15)
BUN: 13 mg/dL (ref 6–20)
CO2: 27 mmol/L (ref 22–32)
Calcium: 10 mg/dL (ref 8.9–10.3)
Chloride: 105 mmol/L (ref 98–111)
Creatinine, Ser: 1.38 mg/dL — ABNORMAL HIGH (ref 0.61–1.24)
GFR, Estimated: >60 mL/min (ref >60)
Glucose, Bld: 87 mg/dL (ref 70–99)
Potassium: 4.5 mmol/L (ref 3.5–5.1)
Sodium: 139 mmol/L (ref 135–145)
Total Bilirubin: 1.1 mg/dL (ref 0.3–1.2)
Total Protein: 7.6 g/dL (ref 6.5–8.1)

## 2020-09-08 MED ORDER — OLANZAPINE 5 MG PO TABS: 5.0000 mg | ORAL_TABLET | Freq: Every day | ORAL | Status: DC

## 2020-09-08 MED ORDER — GABAPENTIN 100 MG PO CAPS
100.0000 mg | ORAL_CAPSULE | Freq: Two times a day (BID) | ORAL | Status: DC
  Administered 2020-09-08: 100 mg via ORAL
  Filled 2020-09-08: qty 1

## 2020-09-08 MED ORDER — NICOTINE 21 MG/24HR TD PT24
21.0000 mg | MEDICATED_PATCH | Freq: Every day | TRANSDERMAL | Status: DC | PRN
  Administered 2020-09-08: 21 mg via TRANSDERMAL
  Filled 2020-09-08: qty 1

## 2020-09-08 NOTE — ED Notes (Signed)
Pt DC do off unit per provider. Pt refused DC information.  Belongings given to pt . Pt escorted by security and [employer]. Transport set up by staff.

## 2020-09-08 NOTE — ED Notes (Signed)
Patient is resting comfortably. 

## 2020-09-08 NOTE — ED Notes (Signed)
Patient sleeping/rise and fall of chest breathing observed 
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
## 2020-09-08 NOTE — ED Notes (Signed)
[facility] security to assist in arranging safe transport to transport back to where he "lives"

## 2020-09-08 NOTE — ED Notes (Signed)
Patient stated that he is becoming agitated by being in the room Suggested he do some cardio/ patient is now shadow boxing in room

## 2020-09-08 NOTE — ED Notes (Signed)
Patient awake eating breakfast. 

## 2020-09-08 NOTE — ED Notes (Signed)
Patient taking a shower.

## 2020-09-08 NOTE — ED Notes (Signed)
Pt agitated . Pt out if room. Threatening off duty police.

## 2020-09-08 NOTE — ED Notes (Signed)
Patient became agitated/walked out of the room Stating that he needs to contact his public defender and needs the phone number Patient was given the number to the public defender and phone

## 2020-09-08 NOTE — ED Notes (Signed)
Patient throwing drinks, table-cussing at staff-lite a cigarette in room-refusing to leave unless he gets a cab voucher-patient was given bus pass

## 2020-09-08 NOTE — ED Notes (Signed)
Pt requesting anxiety medication. Provider notified.
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
## 2020-09-08 NOTE — ED Notes (Signed)
Patient received lunch tray 

## 2020-09-08 NOTE — Consult Note (Addendum)
[facility] Face-to-Face Psychiatry Consult   Reason for Consult:  Suicidal ideation Referring Physician:  Lucien Mons EDP Patient Identification: Justin [name] MRN:  [id number] Principal Diagnosis: Substance induced mood disorder (HCC) Diagnosis:  Principal Problem:   Substance induced mood disorder (HCC) Active Problems:   Substance use disorder   Amphetamine use disorder, severe (HCC)   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HCC)   Total Time spent with patient: 30 minutes  Subjective:   Justin [name] is a 40 y.o. male patient admitted to [facility] ED after parenting with complaint of suicidal ideation and auditory hallucinations.  HPI:  Justin [name], 40 y.o., male patient seen via tele health by this provider, consulted with Dr. Lucianne Muss; and chart reviewed on 09/08/20.  On evaluation Justin [name] reports that he is having homicidal and suicidal thoughts "I was laying in the middle of the street yesterday on [address] and no one would even hit me."  Patient is unable to contract for safety.  Patient also states that he is having thoughts of hurting and killing other people.  States he doesn't have a weapon but "I will kill them with my bear hands"  Patient states that he doesn't have outpatient psychiatric services and he doesn't take psychotropic medications.  Patient also denied drug use and when informed he was positive for amphetamines he stated "I did meth 3 days ago but I quit." During evaluation Justin [name] is sitting up in bed; in no acute distress.  He is alert, oriented x 4, calm and cooperative right now but asking for Ativan stating he needs something to calm him down.  Patient informed that he would not be prescribed Ativan and if he assaulted any staff he would be discharged.  Patient also informed that meth use cause hallucinations and paranoia and informed to get completely better he needed to quit.[name]  His mood is slightly irritable, labile with congruent  affect.  He does not appear to be responding to internal/external stimuli or delusional thoughts; but states he has auditory hallucinations with voices telling him to harm himself and other.  Patient also states he is paranoid but unable to describe what he is paranoid about.    Past Psychiatric History: Polysubstance abuse, substance induced mood disorder (see above)  Risk to [name]:  No Risk to Others:  No Prior Inpatient Therapy:  Yes Prior Outpatient Therapy:  Yes  Past Medical History:  Past Medical History:  Diagnosis Date  . Endocarditis   . Hepatitis C   . History of syphilis 08/25/2014   Treated by [facility] 07-2014.  [name] HIV (human immunodeficiency virus infection) (HCC)   . Seizures (HCC)     Past Surgical History:  Procedure Laterality Date  . hand surgery Right   . LUMBAR PUNCTURE  08/08/2019      . WRIST SURGERY     Family History:  Family History  Adopted: Yes  Family history unknown: Yes   Family Psychiatric  History: none reported Social History:  Social History   Substance and Sexual Activity  Alcohol Use Yes   Comment: 1-2 a week      Social History   Substance and Sexual Activity  Drug Use Yes  . Frequency: 14.0 times per week  . Types: Marijuana, Methamphetamines    Social History   Socioeconomic History  . Marital status: Single    Spouse name: Not on file  . Number of children: Not on file  . Years of education: Not  on file  . Highest education level: Not on file  Occupational History  . Not on file  Tobacco Use  . Smoking status: Current Every Day Smoker    Types: Cigarettes  . Smokeless tobacco: Never Used  Vaping Use  . Vaping Use: Never used  Substance and Sexual Activity  . Alcohol use: Yes    Comment: 1-2 a week   . Drug use: Yes    Frequency: 14.0 times per week    Types: Marijuana, Methamphetamines  . Sexual activity: Yes    Partners: Male    Birth control/protection: Condom    Comment: given condoms  Other Topics Concern  .  Not on file  Social History Narrative   ** Merged History Encounter **       ** Merged History Encounter **       ** Merged History Encounter **       Social Determinants of Health   Financial Resource Strain: Not on file  Food Insecurity: Not on file  TraCorporate investment bankernsportation Needs: Not on file  Physical Activity: Not on file  Stress: Not on file  Social Connections: Not on file   Additional Social History:    Allergies:  No Known Allergies  Labs:  Results for orders placed or performed during the hospital encounter of 09/06/20 (from the past 48 hour(s))  Resp Panel by RT-PCR (Flu A&B, Covid) Nasopharyngeal Swab     Status: None   Collection Time: 09/07/20  2:19 PM   Specimen: Nasopharyngeal Swab; Nasopharyngeal(NP) swabs in vial transport medium  Result Value Ref Range   SARS Coronavirus 2 by RT PCR NEGATIVE NEGATIVE    Comment: (NOTE) SARS-CoV-2 target nucleic acids are NOT DETECTED.  The SARS-CoV-2 RNA is generally detectable in upper respiratory specimens during the acute phase of infection. The lowest concentration of SARS-CoV-2 viral copies this assay can detect is 138 copies/mL. A negative result does not preclude SARS-Cov-2 infection and should not be used as the sole basis for treatment or other patient management decisions. A negative result may occur with  improper specimen collection/handling, submission of specimen other than nasopharyngeal swab, presence of viral mutation(s) within the areas targeted by this assay, and inadequate number of viral copies(<138 copies/mL). A negative result must be combined with clinical observations, patient history, and epidemiological information. The expected result is Negative.  Fact Sheet for Patients:  BloggerCourse.comhttps://www.fda.gov/media/152166/download  Fact Sheet for Healthcare Providers:  SeriousBroker.ithttps://www.fda.gov/media/152162/download  This test is no t yet approved or cleared by the [place] FDA and  has been authorized for  detection and/or diagnosis of SARS-CoV-2 by FDA under an Emergency Use Authorization (EUA). This EUA will remain  in effect (meaning this test can be used) for the duration of the COVID-19 declaration under Section 564(b)(1) of the Act, 21 U.S.C.section 360bbb-3(b)(1), unless the authorization is terminated  or revoked sooner.       Influenza A by PCR NEGATIVE NEGATIVE   Influenza B by PCR NEGATIVE NEGATIVE    Comment: (NOTE) The Xpert Xpress SARS-CoV-2/FLU/RSV plus assay is intended as an aid in the diagnosis of influenza from Nasopharyngeal swab specimens and should not be used as a sole basis for treatment. Nasal washings and aspirates are unacceptable for Xpert Xpress SARS-CoV-2/FLU/RSV testing.  Fact Sheet for Patients: BloggerCourse.comhttps://www.fda.gov/media/152166/download  Fact Sheet for Healthcare Providers: SeriousBroker.ithttps://www.fda.gov/media/152162/download  This test is not yet approved or cleared by the [place] FDA and has been authorized for detection and/or diagnosis of SARS-CoV-2 by FDA under an Emergency Use  Authorization (EUA). This EUA will remain in effect (meaning this test can be used) for the duration of the COVID-19 declaration under Section 564(b)(1) of the Act, 21 U.S.C. section 360bbb-3(b)(1), unless the authorization is terminated or revoked.  Performed at [facility], [address] [place], [place] [address]     Current Facility-Administered Medications  Medication Dose Route Frequency Provider Last Rate Last Admin  . bictegravir-emtricitabine-tenofovir AF (BIKTARVY) 50-200-25 MG per tablet 1 tablet  1 tablet Oral Q breakfast Mancel Bale, MD   1 tablet at 09/08/20 0941  . levETIRAcetam (KEPPRA) tablet 500 mg  500 mg Oral BID Mancel Bale, MD   500 mg at 09/08/20 0941  . nicotine (NICODERM CQ - dosed in mg/24 hours) patch 21 mg  21 mg Transdermal Daily PRN Derwood Kaplan, MD   21 mg at 09/08/20 1154   Current Outpatient Medications   Medication Sig Dispense Refill  . Ascorbic Acid (VITAMIN C PO) Take 1 tablet by mouth daily.    . B Complex Vitamins (VITAMIN B COMPLEX) TABS Take 1 tablet by mouth daily.    . bictegravir-emtricitabine-tenofovir AF (BIKTARVY) 50-200-25 MG TABS tablet Take 1 tablet by mouth daily. 30 tablet 5  . levETIRAcetam (KEPPRA) 500 MG tablet Take 1 tablet (500 mg total) by mouth 2 (two) times daily. 60 tablet 0  . POTASSIUM PO Take 1 tablet by mouth daily.      Musculoskeletal: Strength & Muscle Tone: within normal limits Gait & Station: normal Patient leans: N/A  Psychiatric Specialty Exam: Physical Exam Vitals and nursing note reviewed. Chaperone present: Sitter at bedside.  Constitutional:      General: He is not in acute distress.    Appearance: Normal appearance. He is not ill-appearing.  HENT:     Head: Normocephalic.  Eyes:     Pupils: Pupils are equal, round, and reactive to light.  Cardiovascular:     Rate and Rhythm: Normal rate.  Pulmonary:     Effort: Pulmonary effort is normal.  Musculoskeletal:        General: Normal range of motion.     Cervical back: Normal range of motion.  Skin:    General: Skin is warm and dry.  Neurological:     Mental Status: He is alert and oriented to person, place, and time.  Psychiatric:        Attention and Perception: He perceives auditory hallucinations.        Mood and Affect: Affect is labile.        Speech: Speech normal.        Behavior: Behavior is agitated.        Thought Content: Thought content is paranoid and delusional. Thought content includes suicidal ideation.        Cognition and Memory: Cognition and memory normal.        Judgment: Judgment is impulsive.     Review of Systems  Constitutional: Negative.   HENT: Negative.   Eyes: Negative.   Respiratory: Negative.   Cardiovascular: Negative.   Gastrointestinal: Negative.   Genitourinary: Negative.   Musculoskeletal: Negative.   Skin: Negative.   Neurological:  Negative.   Hematological: Negative.   Psychiatric/Behavioral: Positive for agitation, behavioral problems, hallucinations and suicidal ideas. The patient is nervous/anxious.        Patient also reporting he is homicidal to no one specific but will kill "with my bear hands"     Blood pressure 118/78, pulse 65, temperature 98.4 F (36.9 C), temperature source  Oral, resp. rate 20, height 6' (1.829 m), weight 90.7 kg, SpO2 95 %.Body mass index is 27.12 kg/m.  General Appearance: Casual  Eye Contact:  Good  Speech:  Clear and Coherent and Normal Rate  Volume:  Normal  Mood:  Euthymic  Affect:  Appropriate and Congruent  Thought Process:  Coherent and Goal Directed  Orientation:  Full (Time, Place, and Person)  Thought Content:  WDL  Suicidal Thoughts:  Yes.  without intent/plan  Homicidal Thoughts:  Yes.  without intent/plan  Memory:  Immediate;   Fair Recent;   Fair  Judgement:  Fair  Insight:  Fair  Psychomotor Activity:  Normal  Concentration:  Concentration: Fair and Attention Span: Fair  Recall:  [employer] of Knowledge:  Fair  Language:  Good  Akathisia:  No  Handed:  Right  AIMS (if indicated):     Assets:  Communication Skills Desire for Improvement Leisure Time  ADL's:  Intact  Cognition:  WNL  Sleep:      Treatment Plan Summary: Daily contact with patient to assess and evaluate symptoms and progress in treatment, Medication management and Plan Inpatient psychiatric treatment     Patient agitated related to not getting ativan. Patient informed earlier this morning that he would be discharged if assaulted or threaten staff.  Patient appears to be clear and does not appear to be responding to external/internal stimuli.  Patient med seeking and secondary gain.  Patient will be given resources to follow up with outpatient psychiatric services.    Sent secure message to Dr. Criss Alvine and Dr. Rhunette Croft informing patient psychiatric cleared; outpatient psychiatric resources  given.      Disposition:  Psychiatrically cleared No evidence of imminent risk to [name] or others at present.   Patient does not meet criteria for psychiatric inpatient admission. Supportive therapy provided about ongoing stressors. Refer to IOP. Discussed crisis plan, support from social network, calling 911, coming to the Emergency Department, and calling Suicide Hotline.  Nicholaus Steinke, NP 09/08/2020 2:31 PM

## 2020-09-08 NOTE — Discharge Instructions (Addendum)
For your behavioral health needs you are advised to follow up with [facility]:       [facility]      [address]      [place], [place] [address]      [phone]      They offer psychiatry/medication management, therapy and substance abuse treatment.  New patients are being seen in their walk-in clinic.  Walk-in hours are Monday - Thursday from 8:00 am - 11:00 am for psychiatry, and Friday from 1:00 pm - 4:00 pm for therapy.  Walk-in patients are seen on a first come, first served basis, so try to arrive as early as possible for the best chance of being seen the same day.  

## 2020-09-08 NOTE — BH Assessment (Signed)
Disposition Melbourne Abts, PA, recommends overnight observation for safety and stabilization with psych reassessment in the AM. Due to history of aggressive behaviors and threatening staff while at [facility], patient will remain at [facility] for continual observation.

## 2020-09-08 NOTE — ED Provider Notes (Signed)
Patient cleared by psychiatry for discharge. He is currently stable. Labs were obtained this afternoon and show no acute abnormalities/changes from baseline. Will d/c   Pricilla Loveless, MD 09/08/20 [phone]

## 2020-09-09 NOTE — ED Notes (Signed)
Accessed chart for safety zone portal documentation

## 2020-09-10 NOTE — Telephone Encounter (Signed)
Received call from [place] with [facility] following up on patient. Patient did not show up for appointment. No updated contact information available.   Nonna Renninger [facility]oy[facility] Mast, RN

## 2020-09-17 ENCOUNTER — Telehealth: Payer: Self-pay

## 2020-09-17 ENCOUNTER — Telehealth: Payer: Self-pay | Admitting: *Deleted

## 2020-09-17 NOTE — Telephone Encounter (Signed)
TOC CM received reminder call for pt's appt on 09/21/2020. Pt had CM phone number in chart. They will take number out. TOC CM emailed pt information about his appt. Isidoro Donning RN CCM, [facility] ED TOC CM [phone]

## 2020-09-17 NOTE — Telephone Encounter (Signed)
Received call from case manager, Helmut Muster. She states that someone put her phone number in the patient's chart and listed it as the patient's number. She requested that scheduling remove her number from the chart. There is now no listed contact number for the patient. Helmut Muster is unsure of how to get in contact with the patient. Will make appointment note requesting front desk get updated phone number at next appointment.   Sandie Ano, RN

## 2020-09-21 ENCOUNTER — Ambulatory Visit: Payer: Self-pay

## 2020-09-21 ENCOUNTER — Other Ambulatory Visit: Payer: Self-pay

## 2020-09-21 ENCOUNTER — Encounter: Payer: Self-pay | Admitting: Internal Medicine

## 2020-09-21 DIAGNOSIS — Z23 Encounter for immunization: Secondary | ICD-10-CM

## 2020-09-21 DIAGNOSIS — Z79899 Other long term (current) drug therapy: Secondary | ICD-10-CM

## 2020-09-21 DIAGNOSIS — B20 Human immunodeficiency virus [HIV] disease: Secondary | ICD-10-CM

## 2020-09-21 DIAGNOSIS — Z113 Encounter for screening for infections with a predominantly sexual mode of transmission: Secondary | ICD-10-CM

## 2020-09-22 LAB — URINE CYTOLOGY ANCILLARY ONLY
Chlamydia: NEGATIVE
Comment: NEGATIVE
Comment: NORMAL
Neisseria Gonorrhea: NEGATIVE

## 2020-09-22 LAB — T-HELPER CELL (CD4) - (RCID CLINIC ONLY)
CD4 % Helper T Cell: 30 % — ABNORMAL LOW (ref 33–65)
CD4 T Cell Abs: 689 /uL (ref 400–1790)

## 2020-09-23 ENCOUNTER — Encounter (HOSPITAL_COMMUNITY): Payer: Self-pay

## 2020-09-23 ENCOUNTER — Emergency Department (HOSPITAL_COMMUNITY)
Admission: EM | Admit: 2020-09-23 | Discharge: 2020-09-24 | Disposition: A | Discharge status: Home / Self Care | Payer: Self-pay | Attending: Emergency Medicine | Admitting: Emergency Medicine

## 2020-09-23 DIAGNOSIS — F15259 Other stimulant dependence with stimulant-induced psychotic disorder, unspecified: Secondary | ICD-10-CM | POA: Insufficient documentation

## 2020-09-23 DIAGNOSIS — F17210 Nicotine dependence, cigarettes, uncomplicated: Secondary | ICD-10-CM | POA: Insufficient documentation

## 2020-09-23 DIAGNOSIS — Z21 Asymptomatic human immunodeficiency virus [HIV] infection status: Secondary | ICD-10-CM | POA: Insufficient documentation

## 2020-09-23 DIAGNOSIS — R451 Restlessness and agitation: Secondary | ICD-10-CM | POA: Insufficient documentation

## 2020-09-23 DIAGNOSIS — U071 COVID-19: Secondary | ICD-10-CM | POA: Insufficient documentation

## 2020-09-23 LAB — COMPREHENSIVE METABOLIC PANEL
AG Ratio: 1.4 (calc) (ref 1.0–2.5)
ALT: 27 U/L (ref 9–46)
AST: 30 U/L (ref 10–40)
Albumin: 4.3 g/dL (ref 3.6–5.1)
Alkaline phosphatase (APISO): 115 U/L (ref 36–130)
BUN: 16 mg/dL (ref 7–25)
CO2: 23 mmol/L (ref 20–32)
Calcium: 9.1 mg/dL (ref 8.6–10.3)
Chloride: 106 mmol/L (ref 98–110)
Creat: 1.14 mg/dL (ref 0.60–1.35)
Globulin: 3 g/dL (calc) (ref 1.9–3.7)
Glucose, Bld: 72 mg/dL (ref 65–99)
Potassium: 3.9 mmol/L (ref 3.5–5.3)
Sodium: 141 mmol/L (ref 135–146)
Total Bilirubin: 0.9 mg/dL (ref 0.2–1.2)
Total Protein: 7.3 g/dL (ref 6.1–8.1)

## 2020-09-23 LAB — LIPID PANEL
Cholesterol: 179 mg/dL (ref <200)
HDL: 83 mg/dL (ref >=40)
LDL Cholesterol (Calc): 82 mg/dL (calc)
Non-HDL Cholesterol (Calc): 96 mg/dL (calc) (ref <130)
Total CHOL/HDL Ratio: 2.2 (calc) (ref <5.0)
Triglycerides: 49 mg/dL (ref <150)

## 2020-09-23 LAB — CBC WITH DIFFERENTIAL/PLATELET
Abs Immature Granulocytes: 0.01 10*3/uL (ref 0.00–0.07)
Absolute Monocytes: 292 cells/uL (ref 200–950)
Basophils Absolute: 19 cells/uL (ref 0–200)
Basophils Absolute: 0 10*3/uL (ref 0.0–0.1)
Basophils Relative: 0.7 %
Basophils Relative: 0 %
Eosinophils Absolute: 30 cells/uL (ref 15–500)
Eosinophils Absolute: 0 10*3/uL (ref 0.0–0.5)
Eosinophils Relative: 1.1 %
Eosinophils Relative: 0 %
HCT: 46.4 % (ref 38.5–50.0)
HCT: 46.1 % (ref 39.0–52.0)
Hemoglobin: 15.9 g/dL (ref 13.2–17.1)
Hemoglobin: 15.4 g/dL (ref 13.0–17.0)
Immature Granulocytes: 0 %
Lymphocytes Relative: 35 %
Lymphs Abs: 1345 cells/uL (ref 850–3900)
Lymphs Abs: 2.4 10*3/uL (ref 0.7–4.0)
MCH: 32.3 pg (ref 27.0–33.0)
MCH: 32.2 pg (ref 26.0–34.0)
MCHC: 34.3 g/dL (ref 32.0–36.0)
MCHC: 33.4 g/dL (ref 30.0–36.0)
MCV: 94.1 fL (ref 80.0–100.0)
MCV: 96.2 fL (ref 80.0–100.0)
MPV: 10.7 fL (ref 7.5–12.5)
Monocytes Absolute: 0.6 10*3/uL (ref 0.1–1.0)
Monocytes Relative: 10.8 %
Monocytes Relative: 8 %
Neutro Abs: 1015 cells/uL — ABNORMAL LOW (ref 1500–7800)
Neutro Abs: 3.9 10*3/uL (ref 1.7–7.7)
Neutrophils Relative %: 37.6 %
Neutrophils Relative %: 57 %
Platelets: 125 10*3/uL — ABNORMAL LOW (ref 140–400)
Platelets: 194 10*3/uL (ref 150–400)
RBC: 4.93 10*6/uL (ref 4.20–5.80)
RBC: 4.79 MIL/uL (ref 4.22–5.81)
RDW: 11.8 % (ref 11.0–15.0)
RDW: 12.7 % (ref 11.5–15.5)
Total Lymphocyte: 49.8 %
WBC: 2.7 10*3/uL — ABNORMAL LOW (ref 3.8–10.8)
WBC: 6.9 10*3/uL (ref 4.0–10.5)
nRBC: 0 % (ref 0.0–0.2)

## 2020-09-23 LAB — RPR TITER: RPR Titer: 1:8 {titer} — ABNORMAL HIGH

## 2020-09-23 LAB — HIV-1 RNA QUANT-NO REFLEX-BLD
HIV 1 RNA Quant: NOT DETECTED Copies/mL
HIV-1 RNA Quant, Log: NOT DETECTED Log cps/mL

## 2020-09-23 LAB — ETHANOL: Alcohol, Ethyl (B): <10 mg/dL (ref <10)

## 2020-09-23 LAB — FLUORESCENT TREPONEMAL AB(FTA)-IGG-BLD: Fluorescent Treponemal ABS: REACTIVE — AB

## 2020-09-23 LAB — RPR: RPR Ser Ql: REACTIVE — AB

## 2020-09-23 NOTE — ED Provider Notes (Signed)
[facility]-EMERGENCY DEPT Provider Note   CSN: [id number] Arrival date & time: 09/23/20  2137     History Chief Complaint  Patient presents with   Paranoid   Aggressive Behavior    Justin [name] is a 40 y.o. male.  Patient with history of HIV (CD4 689 3 days ago) presents the emergency department today for manic and paranoid behavior.  Level 5 caveat due to psychiatric illness and sedation.  Per triage note: "Per EMS pt was at a friends home, friends state that the pt begin to talk all over the place and started to take his clothes off presenting in manic behavior. Friends called 911 and EMS was contacted. Pt was looking around and under tables and states that the demons were coming after him, he felt unsafe. Upon arrival, pt was talking all over the place and in an aggressive stance, would not respond to anything other than "cowboy". He got agitated and combative and had to be sedated. 5 mg Haldol and 5 mg of Versed IM at 2055."  Pt with history of polysubstance use.         Past Medical History:  Diagnosis Date   Endocarditis    Hepatitis C    History of syphilis 08/25/2014   Treated by [facility] 07-2014.   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   Acute psychosis (HCC)    Substance use disorder 08/07/2019   Encephalopathy 08/06/2019   Chronic hepatitis C without hepatic coma (HCC) 08/17/2017   Screening  examination for venereal disease 05/03/2017   Encounter for long-term (current) use of high-risk medication 05/03/2017   Anxiety 09/26/2016   Substance abuse (HCC) 09/26/2016   Human immunodeficiency virus (HIV) disease (HCC) 09/02/2014   History of syphilis 08/25/2014    Past Surgical History:  Procedure Laterality Date   hand surgery Right    LUMBAR PUNCTURE  08/08/2019       WRIST SURGERY         Family History  Adopted: Yes  Family history unknown: Yes    Social History   Tobacco Use   Smoking status: Current Every Day Smoker    Types: Cigarettes   Smokeless tobacco: Never Used  Vaping Use   Vaping Use: Never used  Substance Use Topics   Alcohol use: Yes    Comment: 1-2 a week    Drug use: Yes    Frequency: 14.0 times per week    Types: Marijuana, Methamphetamines    Home Medications Prior to Admission medications   Medication Sig Start Date End Date Taking? Authorizing Provider  Ascorbic Acid (VITAMIN C PO) Take 1 tablet by mouth daily.    [provider]  B Complex Vitamins (VITAMIN B COMPLEX) TABS Take 1 tablet by mouth daily.    [provider]  bictegravir-emtricitabine-tenofovir AF (BIKTARVY) 50-200-25 MG TABS tablet Take 1 tablet by mouth daily. 07/16/20   [name], MD  levETIRAcetam (KEPPRA) 500 MG tablet Take 1 tablet (500 mg total) by  mouth 2 (two) times daily. 08/21/20   Money, Gerlene Burdock, FNP  POTASSIUM PO Take 1 tablet by mouth daily.    [provider]    Allergies    Patient has no known allergies.  Review of Systems   Review of Systems  Unable to perform ROS: Psychiatric disorder    Physical Exam Updated Vital Signs BP 108/69    Pulse (!) 107    Temp 98.5 F (36.9 C) (Axillary)    Resp 18    SpO2 97%   Physical Exam Vitals and nursing note reviewed.  Constitutional:      Appearance: He is well-developed.  HENT:     Head: Normocephalic and atraumatic.     Mouth/Throat:     Mouth: Mucous  membranes are moist.  Eyes:     Conjunctiva/sclera: Conjunctivae normal.     Pupils: Pupils are equal, round, and reactive to light.     Comments: Pupils midrange and reactive.  Cardiovascular:     Rate and Rhythm: Normal rate.  Pulmonary:     Effort: No respiratory distress.  Abdominal:     Palpations: Abdomen is soft.     Tenderness: There is no guarding or rebound.  Musculoskeletal:     Cervical back: Normal range of motion and neck supple.  Skin:    General: Skin is warm and dry.  Neurological:     Comments: At time of exam, patient is sleeping soundly after receiving Haldol and Versed.  Unable to cooperate with neuro exam.     ED Results / Procedures / Treatments   Labs (all labs ordered are listed, but only abnormal results are displayed) Labs Reviewed  RESP PANEL BY RT-PCR (FLU A&B, COVID) ARPGX2 - Abnormal; Notable for the following components:      Result Value   SARS Coronavirus 2 by RT PCR POSITIVE (*)    All other components within normal limits  COMPREHENSIVE METABOLIC PANEL - Abnormal; Notable for the following components:   CO2 21 (*)    AST 52 (*)    Total Bilirubin 3.1 (*)    All other components within normal limits  ETHANOL  CBC WITH DIFFERENTIAL/PLATELET  RAPID URINE DRUG SCREEN, HOSP PERFORMED    EKG EKG Interpretation  Date/Time:  Wednesday September 23 2020 22:53:49 EDT Ventricular Rate:  99 PR Interval:  134 QRS Duration: 96 QT Interval:  384 QTC Calculation: 492 R Axis:   -4 Text Interpretation: Normal sinus rhythm Right atrial enlargement Incomplete right bundle branch block Septal infarct , age undetermined Abnormal ECG When compared with ECG of 08/13/2020, No significant change was found Confirmed by Dione Booze ([id number]) on 09/23/2020 11:35:02 PM   Radiology No results found.  Procedures Procedures   Medications Ordered in ED Medications - No data to display  ED Course  I have reviewed the triage vital signs and the nursing  notes.  Pertinent labs & imaging results that were available during my care of the patient were reviewed by me and considered in my medical decision making (see chart for details).  Patient seen and examined.  Patient is soundly sleeping in soft restraints.  Medical screening labs ordered.  Will likely need TTS consult given reported incident tonight.  Vital signs reviewed and are as follows: BP 108/69    Pulse (!) 107    Temp 98.5 F (36.9 C) (Axillary)    Resp 18    SpO2 97%   1:04 AM patient now awake and alert, calm, eating a  sandwich.  He states that he felt like someone was trying to hurt him earlier tonight.  Will attempt TTS consultation now that patient is awake and alert.  He does not need restraints at this time.  5:56 AM Continuing to await TTS consult.   Pt's PCR is positive, but currently asymptomatic, no hypoxia, lungs CTAB. Negative test 3/7. Will need COVID isolation precautions and instructions.     MDM Rules/Calculators/A&P                           Final Clinical Impression(s) / ED Diagnoses Final diagnoses:  Agitation  Lab test positive for detection of COVID-19 virus    Rx / DC Orders ED Discharge Orders    None       Renne Crigler, PA-C 09/24/20 0559    Linwood Dibbles, MD 09/24/20 1505

## 2020-09-23 NOTE — ED Triage Notes (Signed)
Pt BIB EMS  Per EMS pt was at a friends home, friends state that the pt begin to talk all over the place and started to take his clothes off presenting in manic behavior. Friends called 911 and EMS was contacted. Pt was looking around and under tables and states that the demons were coming after him, he felt unsafe. Upon arrival, pt was talking all over the place and in an aggressive stance, would not respond to anything other than "cowboy". He got agitated and combative and had to be sedated. 5 mg Haldol and 5 mg of Versed IM at 2055. Oxygen at 2 L nasal cannula per precautions. Pt was IVC a few months ago for same issue.   Vitals  110/92 cbg 110 98 % 2L/min  Sinus tach 112

## 2020-09-24 LAB — COMPREHENSIVE METABOLIC PANEL
ALT: 31 U/L (ref 0–44)
AST: 52 U/L — ABNORMAL HIGH (ref 15–41)
Albumin: 4.4 g/dL (ref 3.5–5.0)
Alkaline Phosphatase: 82 U/L (ref 38–126)
Anion gap: 10 (ref 5–15)
BUN: 16 mg/dL (ref 6–20)
CO2: 21 mmol/L — ABNORMAL LOW (ref 22–32)
Calcium: 9.1 mg/dL (ref 8.9–10.3)
Chloride: 106 mmol/L (ref 98–111)
Creatinine, Ser: 1.04 mg/dL (ref 0.61–1.24)
GFR, Estimated: >60 mL/min (ref >60)
Glucose, Bld: 97 mg/dL (ref 70–99)
Potassium: 3.6 mmol/L (ref 3.5–5.1)
Sodium: 137 mmol/L (ref 135–145)
Total Bilirubin: 3.1 mg/dL — ABNORMAL HIGH (ref 0.3–1.2)
Total Protein: 7.7 g/dL (ref 6.5–8.1)

## 2020-09-24 LAB — RESP PANEL BY RT-PCR (FLU A&B, COVID) ARPGX2
Influenza A by PCR: NEGATIVE
Influenza B by PCR: NEGATIVE
SARS Coronavirus 2 by RT PCR: POSITIVE — AB

## 2020-09-24 MED ORDER — BICTEGRAVIR-EMTRICITAB-TENOFOV 50-200-25 MG PO TABS
1.0000 | ORAL_TABLET | Freq: Every day | ORAL | Status: DC
  Administered 2020-09-24: 1 via ORAL
  Filled 2020-09-24: qty 1

## 2020-09-24 MED ORDER — LEVETIRACETAM 500 MG PO TABS
500.0000 mg | ORAL_TABLET | Freq: Two times a day (BID) | ORAL | Status: DC
Start: 2020-09-24 — End: 2020-09-24
  Administered 2020-09-24: 500 mg via ORAL
  Filled 2020-09-24: qty 1

## 2020-09-24 MED ORDER — ACETAMINOPHEN 325 MG PO TABS
650.0000 mg | ORAL_TABLET | Freq: Once | ORAL | Status: AC
  Administered 2020-09-24: 650 mg via ORAL
  Filled 2020-09-24: qty 2

## 2020-09-24 MED ORDER — HYDROXYZINE HCL 25 MG PO TABS
25.0000 mg | ORAL_TABLET | Freq: Four times a day (QID) | ORAL | Status: DC | PRN
  Administered 2020-09-24: 25 mg via ORAL
  Filled 2020-09-24: qty 1

## 2020-09-24 MED ORDER — KETOROLAC TROMETHAMINE 30 MG/ML IJ SOLN
15.0000 mg | Freq: Once | INTRAMUSCULAR | Status: AC
  Administered 2020-09-24: 15 mg via INTRAVENOUS
  Filled 2020-09-24: qty 1

## 2020-09-24 NOTE — ED Notes (Signed)
Patient come to door and open door, redirected by staff to close door and staff will come in and assist him. Patient understands and redirected easily.

## 2020-09-24 NOTE — [facility] Counselor (Signed)
This counselor completed TTS face to face. Pending provider disposition. 

## 2020-09-24 NOTE — ED Notes (Signed)
Provided patient with [employer] for discharge. Asking to speak with case manager, will consult

## 2020-09-24 NOTE — ED Notes (Signed)
Pt belongings returned to patient at this time. Pt aware Peer Support is to see him in ED for outpatient resources.  Pt to be d/c'd after assessment by Peer Support.

## 2020-09-24 NOTE — ED Notes (Signed)
Once case manager sets up ride will discharge patient

## 2020-09-24 NOTE — BH Assessment (Signed)
[facility] Assessment Progress Note  Per Nelly Rout, MD, this pt does not require psychiatric hospitalization at this time.  Pt presents under IVC initiated by a Transport planner and upheld by EDP Melene Plan, DO, which has been rescinded by Dr Lucianne Muss.  Pt is psychiatrically cleared.  Discharge instructions include referral information for the Substance Abuse Intensive Outpatient Program at [facility].  Pt would also benefit from seeing a Peer Support Specialist, and a peer support consult has been ordered for pt.  EDP Mancel Bale, MD and pt's nurse, Ladona Ridgel, have been notified.  Doylene Canning, MA Triage Specialist [phone]
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
## 2020-09-24 NOTE — ED Notes (Signed)
TTS in to see pt at this time.

## 2020-09-24 NOTE — ED Notes (Signed)
Pt has called out twice asking for medication "to relax." No PRN orders in at this time. Dr. Effie Shy notified and aware. Will continue to monitor.

## 2020-09-24 NOTE — ED Notes (Signed)
Pt requested bedside commode. Bedside commode at bedside for pt with wipes.

## 2020-09-24 NOTE — Patient Outreach (Signed)
CPSS met with Pt an discussed  A few concerns that Pt had at the time. CPSS informed Pt that he needs to complete covid Protocol before placing Pt. CPSS issued contact information for Pt to follow up with CPSS after his 7 days an CPSS will continue to attempt to place Pt.

## 2020-09-24 NOTE — Progress Notes (Addendum)
..   Transition of Care [facility]) - Emergency Department Mini Assessment   Patient Details  Name: Justin [name] MRN: [id number] Date of Birth: 01/16/1981  Transition of [facility]) CM/SW Contact:    Elliot Cousin, RN Phone Number: [phone] 09/24/2020, 12:41 PM   Clinical Narrative: TOC CM spoke to pt and provided address [place]. Pt is COVID positive. Explained to pt that he has upcoming appt with Infectious Disease. Explained the appt time will be on is dc paperwork. Waiver scanned to [email or web address]. Verbal consent was given for waiver.    ED Mini Assessment: What brought you to the Emergency Department? : paranoid behavior  Barriers to Discharge: No Barriers Identified  Barrier interventions: arranged [place] to home  Means of departure: Hospital Transport  Interventions which prevented an admission or readmission: Transportation Screening    Patient Contact and Communications        ,                 Admission diagnosis:  Medical Clearance Patient Active Problem List   Diagnosis Date Noted  . 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  . 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HCC)   . Suicidal ideations 07/08/2020  . 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HCC)   . Seizure (HCC) 06/26/2020  . Seizure-like activity (HCC) 06/25/2020  . Amphetamine and psychostimulant-induced psychosis with delusions (HCC)   . 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 Acute psychosis (HCC)   . Substance use disorder 08/07/2019  . Encephalopathy 08/06/2019  . Chronic hepatitis C without hepatic coma (HCC) 08/17/2017  . Screening examination for venereal disease 05/03/2017  . Encounter for long-term (current) use of high-risk medication 05/03/2017  . Anxiety 09/26/2016  . Substance abuse (HCC) 09/26/2016  . Human immunodeficiency virus (HIV)  disease (HCC) 09/02/2014  . History of syphilis 08/25/2014   PCP:  Patient, No Pcp Per Pharmacy:   [id number] - [place], [place] - [address] AT [facility] [address] [name] [id number] Phone: [phone] Fax: [phone]

## 2020-09-24 NOTE — Discharge Instructions (Addendum)
To help you maintain a sober lifestyle, a substance abuse treatment program may be beneficial to you.  Contact [facility] at your earliest opportunity to ask about enrolling their Substance Abuse Intensive Outpatient Program.  Please keep Covid-19 guidelines in mind when seeking treatment services, for your safety and the safety of others:       [facility]      [address]      [place], [place] [address]      [phone]      They also offer psychiatry/medication management and therapy.  New patients are seen in their walk-in clinic.  Walk-in hours are Monday - Thursday from 8:00 am - 11:00 am for psychiatry, and Friday from 1:00 pm - 4:00 pm for all other services.  Walk-in patients are seen on a first come, first served basis, so try to arrive as early as possible for the best chance of being seen the same day.  Make sure that you stay away from everyone, and wear a mask whenever you are close to anyone, for at least 5 days.  Do this longer if you continue to have symptoms such as fever or cough.  Make sure you take all of your medications as directed.  If you have a fever you can use Tylenol, and for cough use Robitussin-DM.

## 2020-09-24 NOTE — ED Provider Notes (Signed)
Emergency Medicine Observation Re-evaluation Note  Justin [name] is a 40 y.o. male, seen on rounds today.  Pt initially presented to the ED for complaints of Paranoid and Aggressive Behavior Currently, the patient is resting comfortably in bed.  Physical Exam  BP 101/62 (BP Location: Right Arm)   Pulse 79   Temp 98.3 F (36.8 C) (Oral)   Resp 16   SpO2 98%  Physical Exam General: Alert and cooperative Cardiac: Normal heart rate Lungs: Normal respiratory rate Psych: Not agitated or delusional  ED Course / MDM  EKG:EKG Interpretation  Date/Time:  Wednesday September 23 2020 22:53:49 EDT Ventricular Rate:  99 PR Interval:  134 QRS Duration: 96 QT Interval:  384 QTC Calculation: 492 R Axis:   -4 Text Interpretation: Normal sinus rhythm Right atrial enlargement Incomplete right bundle branch block Septal infarct , age undetermined Abnormal ECG When compared with ECG of 08/13/2020, No significant change was found Confirmed by Dione Booze ([id number]) on 09/23/2020 11:35:02 PM   I have reviewed the labs performed to date as well as medications administered while in observation.  Recent changes in the last 24 hours include uncomplicated Covid infection..  Plan  Current plan is for daily support by psychiatric services.  I suspect that the patient will recover and be able to be discharged shortly. Patient is not under full IVC at this time.   Mancel Bale, MD 09/24/20 [id number]

## 2020-10-14 ENCOUNTER — Encounter: Payer: Self-pay | Admitting: Internal Medicine

## 2020-11-26 ENCOUNTER — Other Ambulatory Visit: Payer: Self-pay | Admitting: *Deleted

## 2020-11-26 DIAGNOSIS — R569 Unspecified convulsions: Secondary | ICD-10-CM

## 2020-11-26 MED ORDER — LEVETIRACETAM 500 MG PO TABS: 500.0000 mg | ORAL_TABLET | Freq: Two times a day (BID) | ORAL | 1 refills | Status: DC

## 2020-11-26 NOTE — Progress Notes (Signed)
Patient called from pharmacy. He is out of sheriff's custody, went to pick up his Biktarvy and Keppra but Keppra had no refills. He took last dose yesterday evening. Keppra was last refilled (print) during hospitalization. As Dr [name] has prescribed this recently, RN sent refill to [facility], transferred patient to front desk for scheduling. Andree Coss, RN

## 2020-11-27 ENCOUNTER — Other Ambulatory Visit: Payer: Self-pay

## 2020-11-27 ENCOUNTER — Emergency Department (HOSPITAL_COMMUNITY)
Admission: EM | Admit: 2020-11-27 | Discharge: 2020-11-27 | Disposition: A | Discharge status: Home / Self Care | Payer: Self-pay | Attending: Emergency Medicine | Admitting: Emergency Medicine

## 2020-11-27 DIAGNOSIS — Z79899 Other long term (current) drug therapy: Secondary | ICD-10-CM | POA: Insufficient documentation

## 2020-11-27 DIAGNOSIS — R569 Unspecified convulsions: Secondary | ICD-10-CM | POA: Insufficient documentation

## 2020-11-27 DIAGNOSIS — Z21 Asymptomatic human immunodeficiency virus [HIV] infection status: Secondary | ICD-10-CM | POA: Insufficient documentation

## 2020-11-27 DIAGNOSIS — B20 Human immunodeficiency virus [HIV] disease: Secondary | ICD-10-CM

## 2020-11-27 DIAGNOSIS — F17210 Nicotine dependence, cigarettes, uncomplicated: Secondary | ICD-10-CM | POA: Insufficient documentation

## 2020-11-27 DIAGNOSIS — Y900 Blood alcohol level of less than 20 mg/100 ml: Secondary | ICD-10-CM | POA: Insufficient documentation

## 2020-11-27 LAB — BASIC METABOLIC PANEL
Anion gap: 14 (ref 5–15)
BUN: 12 mg/dL (ref 6–20)
CO2: 23 mmol/L (ref 22–32)
Calcium: 9.6 mg/dL (ref 8.9–10.3)
Chloride: 101 mmol/L (ref 98–111)
Creatinine, Ser: 1.27 mg/dL — ABNORMAL HIGH (ref 0.61–1.24)
GFR, Estimated: >60 mL/min (ref >60)
Glucose, Bld: 118 mg/dL — ABNORMAL HIGH (ref 70–99)
Potassium: 3.4 mmol/L — ABNORMAL LOW (ref 3.5–5.1)
Sodium: 138 mmol/L (ref 135–145)

## 2020-11-27 LAB — CBC WITH DIFFERENTIAL/PLATELET
Abs Immature Granulocytes: 0.04 10*3/uL (ref 0.00–0.07)
Basophils Absolute: 0 10*3/uL (ref 0.0–0.1)
Basophils Relative: 0 %
Eosinophils Absolute: 0 10*3/uL (ref 0.0–0.5)
Eosinophils Relative: 1 %
HCT: 50.3 % (ref 39.0–52.0)
Hemoglobin: 16.9 g/dL (ref 13.0–17.0)
Immature Granulocytes: 1 %
Lymphocytes Relative: 32 %
Lymphs Abs: 2.6 10*3/uL (ref 0.7–4.0)
MCH: 32.1 pg (ref 26.0–34.0)
MCHC: 33.6 g/dL (ref 30.0–36.0)
MCV: 95.6 fL (ref 80.0–100.0)
Monocytes Absolute: 0.7 10*3/uL (ref 0.1–1.0)
Monocytes Relative: 9 %
Neutro Abs: 4.7 10*3/uL (ref 1.7–7.7)
Neutrophils Relative %: 57 %
Platelets: 193 10*3/uL (ref 150–400)
RBC: 5.26 MIL/uL (ref 4.22–5.81)
RDW: 12.5 % (ref 11.5–15.5)
WBC: 8.2 10*3/uL (ref 4.0–10.5)
nRBC: 0 % (ref 0.0–0.2)

## 2020-11-27 LAB — ETHANOL: Alcohol, Ethyl (B): <10 mg/dL (ref <10)

## 2020-11-27 MED ORDER — LEVETIRACETAM 500 MG PO TABS: 500.0000 mg | ORAL_TABLET | Freq: Two times a day (BID) | ORAL | 0 refills | Status: DC

## 2020-11-27 MED ORDER — LEVETIRACETAM IN NACL 1000 MG/100ML IV SOLN
1000.0000 mg | Freq: Once | INTRAVENOUS | Status: AC
  Administered 2020-11-27: 1000 mg via INTRAVENOUS
  Filled 2020-11-27: qty 100

## 2020-11-27 MED ORDER — ACETAMINOPHEN 500 MG PO TABS
1000.0000 mg | ORAL_TABLET | Freq: Once | ORAL | Status: AC
  Administered 2020-11-27: 1000 mg via ORAL
  Filled 2020-11-27: qty 2

## 2020-11-27 NOTE — ED Notes (Signed)
Supplemental O2 stopped. O2 saturation 100%. Pt resting. VSS

## 2020-11-27 NOTE — ED Provider Notes (Signed)
[facility]-EMERGENCY DEPT Provider Note   CSN: [id number] Arrival date & time: 11/27/20  0052     History Chief Complaint  Patient presents with  . Seizures    Justin [name] is a 40 y.o. male.  The history is provided by medical records.     LEVEL V CAVEAT:  POST-ICTAL  1:15 AM Was on my way to go see patient when he apparently had subsequent seizure witnssed by RN as she was trying to start IV.  This was brief and self terminated prior to myself arriving at this room.  Prior to this he was reportedly AAOx3.  He reported to RN that he ran out of keppra 2 days ago and had seizure at home tonight witnesses by roommate.  Patient currently post-ictal and not able to give any additional history.  Past Medical History:  Diagnosis Date  . Endocarditis   . Hepatitis C   . History of syphilis 08/25/2014   Treated by [facility] 07-2014.  [name] HIV (human immunodeficiency virus infection) (HCC)   . Seizures [facility])     Patient Active Problem List   Diagnosis Date Noted  . 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  . 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HCC)   . Suicidal ideations 07/08/2020  . 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HCC)   . Seizure (HCC) 06/26/2020  . Seizure-like activity (HCC) 06/25/2020  . Amphetamine and psychostimulant-induced psychosis with delusions (HCC)   . 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 Acute psychosis (HCC)   . Substance use disorder 08/07/2019  . Encephalopathy 08/06/2019  . Chronic hepatitis C without hepatic coma (HCC) 08/17/2017  . Screening examination for venereal disease 05/03/2017  . Encounter for long-term (current) use of high-risk medication 05/03/2017  . Anxiety 09/26/2016  . Substance abuse (HCC) 09/26/2016  . Human immunodeficiency virus (HIV) disease (HCC) 09/02/2014  . History of syphilis 08/25/2014    Past Surgical  History:  Procedure Laterality Date  . hand surgery Right   . LUMBAR PUNCTURE  08/08/2019      . WRIST SURGERY         Family History  Adopted: Yes  Family history unknown: Yes    Social History   Tobacco Use  . Smoking status: Current Every Day Smoker    Types: Cigarettes  . Smokeless tobacco: Never Used  Vaping Use  . Vaping Use: Never used  Substance Use Topics  . Alcohol use: Yes    Comment: 1-2 a week   . Drug use: Yes    Frequency: 14.0 times per week    Types: Marijuana, Methamphetamines    Home Medications Prior to Admission medications   Medication Sig Start Date End Date Taking? Authorizing Provider  Ascorbic Acid (VITAMIN C PO) Take 1 tablet by mouth daily.    [provider]  B Complex Vitamins (VITAMIN B COMPLEX) TABS Take 1 tablet by mouth daily.    [provider]  bictegravir-emtricitabine-tenofovir AF (BIKTARVY) 50-200-25 MG TABS tablet Take 1 tablet by mouth daily. 07/16/20   [name], MD  levETIRAcetam (KEPPRA) 500 MG tablet Take 1 tablet (500 mg total) by mouth 2 (two) times daily. 08/21/20   Money, Gerlene Burdock, FNP  levETIRAcetam (KEPPRA) 500 MG tablet Take 1 tablet (500 mg total) by mouth 2 (two) times daily. 11/26/20   [name], MD  POTASSIUM PO Take 1 tablet by mouth daily.    [provider]  Allergies    Patient has no known allergies.  Review of Systems   Review of Systems  Unable to perform ROS: Other    Physical Exam Updated Vital Signs BP 114/85 (BP Location: Right Arm)   Pulse 86   Temp 97.8 F (36.6 C) (Oral)   Ht 5\' 11"  (1.803 m)   Wt 93 kg   SpO2 99%   BMI 28.59 kg/m   Physical Exam Vitals and nursing note reviewed.  Constitutional:      Appearance: He is well-developed.  HENT:     Head: Normocephalic and atraumatic.     Comments: No visible head trauma Eyes:     Conjunctiva/sclera: Conjunctivae normal.     Pupils: Pupils are equal, round, and reactive to light.     Comments:  PERRL  Cardiovascular:     Rate and Rhythm: Normal rate and regular rhythm.     Heart sounds: Normal heart sounds.  Pulmonary:     Effort: Pulmonary effort is normal.     Breath sounds: Normal breath sounds.  Abdominal:     General: Bowel sounds are normal.     Palpations: Abdomen is soft.  Musculoskeletal:        General: Normal range of motion.     Cervical back: Normal range of motion.  Skin:    General: Skin is warm and dry.  Neurological:     Comments: Awake but post-ictal, blankly staring around room but is spontaneously moving arms and legs     ED Results / Procedures / Treatments   Labs (all labs ordered are listed, but only abnormal results are displayed) Labs Reviewed  BASIC METABOLIC PANEL - Abnormal; Notable for the following components:      Result Value   Potassium 3.4 (*)    Glucose, Bld 118 (*)    Creatinine, Ser 1.27 (*)    All other components within normal limits  CBC WITH DIFFERENTIAL/PLATELET  ETHANOL  RAPID URINE DRUG SCREEN, HOSP PERFORMED    EKG None  Radiology No results found.  Procedures Procedures   Medications Ordered in ED Medications  levETIRAcetam (KEPPRA) IVPB 1000 mg/100 mL premix (1,000 mg Intravenous New Bag/Given 11/27/20 0127)    ED Course  I have reviewed the triage vital signs and the nursing notes.  Pertinent labs & imaging results that were available during my care of the patient were reviewed by me and considered in my medical decision making (see chart for details).    MDM Rules/Calculators/A&P  40 y.o. M here after seizure at home witnessed by roommate.  On arrival to ED he was AAOx3 but while RN trying to start IV, had subsequent seizure that was brief and self terminated.  He is currently post-ictal and unable to give additional history.  He did report running out of his keppra 2 days ago.  Does have known HIV as well but immune competent based on last CD4 counts.  No hx of meningitis.  He is HD stable at present.   Will obtain labs, EKG, IV keppra load.  Will monitor.  1:46 AM Patient now more awake/alert.  He is complaining of headache.  Given dose of tylenol.  Labs still pending.  6:09 AM Patient fully awake and alert.  He is tolerating p.o. at present.  He remains afebrile and nontoxic without focal neurologic deficits and no further seizure activity.  No neck pain or nuchal rigidity. He does have history of HIV, but immunocompetent based on last CD4.  I have  low suspicion for meningitis as cause of seizures, more likely medication non-compliance.  He confirms he has been out of his seizure medication for the past 2 days.  States he was told his refill was waiting for him at the pharmacy, but only his HIV medications were there.  I have sent refill to his pharmacy, encouraged him to take as directed.  He does need to follow-up with neurology.  He may return here for any new or acute changes.  Final Clinical Impression(s) / ED Diagnoses Final diagnoses:  Seizure [facility])    Rx / DC Orders ED Discharge Orders         Ordered    levETIRAcetam (KEPPRA) 500 MG tablet  2 times daily        11/27/20 0609           Garlon Hatchet, PA-C 11/27/20 4627    Glynn Octave, MD 11/27/20 [phone]

## 2020-11-27 NOTE — ED Triage Notes (Addendum)
Pt arrives via EMS from home following a seizure witness by his roommate. Pt reports he has not had Keppra in 2 days because he ran out. Currently c/o headache and diplopia.  Currently alert and oriented to person and situation.

## 2020-11-27 NOTE — Discharge Instructions (Signed)
Refills of your keppra sent to pharmacy.  Pick up today and take as directed. We recommend that you follow up in one of the outpatient neurology offices for ongoing management of your seizures. Return here for new/acute changes.

## 2020-11-27 NOTE — ED Notes (Signed)
Pt had seizure witnessed by RN that lasted approximately 1 minute. Provider made aware and assessed at bedside. Pt maintained patent airway. Supplemental O2 applied.

## 2020-11-27 NOTE — ED Notes (Signed)
Pt verbalized understanding of d/c, medication, and follow up care.  

## 2020-11-30 LAB — COMPLETE METABOLIC PANEL WITH GFR
AG Ratio: 1.5 (calc) (ref 1.0–2.5)
ALT: 30 U/L (ref 9–46)
AST: 36 U/L (ref 10–40)
Albumin: 4.9 g/dL (ref 3.6–5.1)
Alkaline phosphatase (APISO): 96 U/L (ref 36–130)
BUN: 14 mg/dL (ref 7–25)
CO2: 21 mmol/L (ref 20–32)
Calcium: 10 mg/dL (ref 8.6–10.3)
Chloride: 103 mmol/L (ref 98–110)
Creat: 1.15 mg/dL (ref 0.60–1.35)
GFR, Est African American: 92 mL/min/{1.73_m2} (ref >=60)
GFR, Est Non African American: 80 mL/min/{1.73_m2} (ref >=60)
Globulin: 3.2 g/dL (calc) (ref 1.9–3.7)
Glucose, Bld: 82 mg/dL (ref 65–99)
Potassium: 4 mmol/L (ref 3.5–5.3)
Sodium: 138 mmol/L (ref 135–146)
Total Bilirubin: 2.1 mg/dL — ABNORMAL HIGH (ref 0.2–1.2)
Total Protein: 8.1 g/dL (ref 6.1–8.1)

## 2020-11-30 LAB — CBC WITH DIFFERENTIAL/PLATELET
Absolute Monocytes: 755 cells/uL (ref 200–950)
Basophils Absolute: 10 cells/uL (ref 0–200)
Basophils Relative: 0.1 %
Eosinophils Absolute: 10 cells/uL — ABNORMAL LOW (ref 15–500)
Eosinophils Relative: 0.1 %
HCT: 46.7 % (ref 38.5–50.0)
Hemoglobin: 16.8 g/dL (ref 13.2–17.1)
Lymphs Abs: 2587 cells/uL (ref 850–3900)
MCH: 32.9 pg (ref 27.0–33.0)
MCHC: 36 g/dL (ref 32.0–36.0)
MCV: 91.4 fL (ref 80.0–100.0)
MPV: 10.4 fL (ref 7.5–12.5)
Monocytes Relative: 7.7 %
Neutro Abs: 6439 cells/uL (ref 1500–7800)
Neutrophils Relative %: 65.7 %
Platelets: 211 10*3/uL (ref 140–400)
RBC: 5.11 10*6/uL (ref 4.20–5.80)
RDW: 12 % (ref 11.0–15.0)
Total Lymphocyte: 26.4 %
WBC: 9.8 10*3/uL (ref 3.8–10.8)

## 2020-11-30 LAB — T-HELPER CELLS (CD4) COUNT (NOT AT ARMC)
Absolute CD4: 500 cells/uL (ref 490–1740)
CD4 T Helper %: 21 % — ABNORMAL LOW (ref 30–61)
Total lymphocyte count: 2346 cells/uL (ref 850–3900)

## 2020-11-30 LAB — HIV-1 RNA QUANT-NO REFLEX-BLD
HIV 1 RNA Quant: NOT DETECTED Copies/mL
HIV-1 RNA Quant, Log: NOT DETECTED Log cps/mL

## 2020-12-11 ENCOUNTER — Encounter: Payer: Self-pay | Admitting: Infectious Diseases

## 2021-01-15 ENCOUNTER — Encounter (HOSPITAL_COMMUNITY): Payer: Self-pay

## 2021-01-15 ENCOUNTER — Emergency Department (HOSPITAL_COMMUNITY)
Admission: EM | Admit: 2021-01-15 | Discharge: 2021-01-15 | Disposition: A | Discharge status: Home / Self Care | Payer: Self-pay | Attending: Emergency Medicine | Admitting: Emergency Medicine

## 2021-01-15 DIAGNOSIS — F17210 Nicotine dependence, cigarettes, uncomplicated: Secondary | ICD-10-CM | POA: Insufficient documentation

## 2021-01-15 DIAGNOSIS — Y900 Blood alcohol level of less than 20 mg/100 ml: Secondary | ICD-10-CM | POA: Insufficient documentation

## 2021-01-15 DIAGNOSIS — R569 Unspecified convulsions: Secondary | ICD-10-CM

## 2021-01-15 DIAGNOSIS — D72829 Elevated white blood cell count, unspecified: Secondary | ICD-10-CM | POA: Insufficient documentation

## 2021-01-15 DIAGNOSIS — Z79899 Other long term (current) drug therapy: Secondary | ICD-10-CM | POA: Insufficient documentation

## 2021-01-15 DIAGNOSIS — R519 Headache, unspecified: Secondary | ICD-10-CM | POA: Insufficient documentation

## 2021-01-15 LAB — CBC WITH DIFFERENTIAL/PLATELET
Abs Immature Granulocytes: 0.02 10*3/uL (ref 0.00–0.07)
Basophils Absolute: 0 10*3/uL (ref 0.0–0.1)
Basophils Relative: 0 %
Eosinophils Absolute: 0 10*3/uL (ref 0.0–0.5)
Eosinophils Relative: 0 %
HCT: 46.8 % (ref 39.0–52.0)
Hemoglobin: 15.6 g/dL (ref 13.0–17.0)
Immature Granulocytes: 0 %
Lymphocytes Relative: 10 %
Lymphs Abs: 1.1 10*3/uL (ref 0.7–4.0)
MCH: 32.9 pg (ref 26.0–34.0)
MCHC: 33.3 g/dL (ref 30.0–36.0)
MCV: 98.7 fL (ref 80.0–100.0)
Monocytes Absolute: 0.9 10*3/uL (ref 0.1–1.0)
Monocytes Relative: 8 %
Neutro Abs: 9.5 10*3/uL — ABNORMAL HIGH (ref 1.7–7.7)
Neutrophils Relative %: 82 %
Platelets: 206 10*3/uL (ref 150–400)
RBC: 4.74 MIL/uL (ref 4.22–5.81)
RDW: 13.2 % (ref 11.5–15.5)
WBC: 11.5 10*3/uL — ABNORMAL HIGH (ref 4.0–10.5)
nRBC: 0 % (ref 0.0–0.2)

## 2021-01-15 LAB — COMPREHENSIVE METABOLIC PANEL
ALT: 39 U/L (ref 0–44)
AST: 51 U/L — ABNORMAL HIGH (ref 15–41)
Albumin: 5 g/dL (ref 3.5–5.0)
Alkaline Phosphatase: 79 U/L (ref 38–126)
Anion gap: 14 (ref 5–15)
BUN: 24 mg/dL — ABNORMAL HIGH (ref 6–20)
CO2: 23 mmol/L (ref 22–32)
Calcium: 9.6 mg/dL (ref 8.9–10.3)
Chloride: 97 mmol/L — ABNORMAL LOW (ref 98–111)
Creatinine, Ser: 1.44 mg/dL — ABNORMAL HIGH (ref 0.61–1.24)
GFR, Estimated: >60 mL/min (ref >60)
Glucose, Bld: 89 mg/dL (ref 70–99)
Potassium: 3.4 mmol/L — ABNORMAL LOW (ref 3.5–5.1)
Sodium: 134 mmol/L — ABNORMAL LOW (ref 135–145)
Total Bilirubin: 3.9 mg/dL — ABNORMAL HIGH (ref 0.3–1.2)
Total Protein: 8.4 g/dL — ABNORMAL HIGH (ref 6.5–8.1)

## 2021-01-15 LAB — MAGNESIUM: Magnesium: 2.1 mg/dL (ref 1.7–2.4)

## 2021-01-15 LAB — ETHANOL: Alcohol, Ethyl (B): <10 mg/dL (ref <10)

## 2021-01-15 LAB — CBG MONITORING, ED: Glucose-Capillary: 77 mg/dL (ref 70–99)

## 2021-01-15 MED ORDER — ACETAMINOPHEN 325 MG PO TABS
650.0000 mg | ORAL_TABLET | Freq: Once | ORAL | Status: AC
  Administered 2021-01-15: 650 mg via ORAL
  Filled 2021-01-15: qty 2

## 2021-01-15 MED ORDER — LEVETIRACETAM 500 MG PO TABS: 500.0000 mg | ORAL_TABLET | Freq: Two times a day (BID) | ORAL | 0 refills | Status: DC

## 2021-01-15 MED ORDER — LEVETIRACETAM 500 MG PO TABS
500.0000 mg | ORAL_TABLET | Freq: Once | ORAL | Status: AC
  Administered 2021-01-15: 500 mg via ORAL
  Filled 2021-01-15: qty 1

## 2021-01-15 NOTE — ED Notes (Addendum)
Pt brought back to bed from triage.  Pt uncooperative and refused to stand/get into the bed.  Security assisted Pt into the bed.    Triage staff noted Pt has been walking lobby and walked outside.  Pt being belligerent d/t not immediately getting a room.

## 2021-01-15 NOTE — Discharge Instructions (Addendum)
Lab work was normal.  Take your seizure medicine as prescribed.

## 2021-01-15 NOTE — ED Notes (Addendum)
Patient escorted out by [facility]. Patient refusing to leave hall bed after being discharged.

## 2021-01-15 NOTE — ED Triage Notes (Signed)
Patient arrived via [facility] from "the street".   Pt reports he had a seizure. Per EMS patient has been A/Ox4 and ambulatory the whole time.   C/o headache   20g LAC

## 2021-01-15 NOTE — ED Notes (Signed)
Aram Beecham RN attempted to draw labs x2 w/o success.  Pt uncooperative with lab draw.

## 2021-01-15 NOTE — ED Notes (Signed)
EMS states bystander called EMS for patient due to him lying in street. No seizure witnessed by anyone and no seizure like activity witnessed.

## 2021-01-15 NOTE — ED Notes (Signed)
Pt got up out of wheelchair in the lobby and walked to the screeners desk and sat down in the floor and wouldn't get up. Security called and pt placed back in wheelchair.

## 2021-01-15 NOTE — ED Provider Notes (Signed)
[facility]-EMERGENCY DEPT Provider Note   CSN: [id number] Arrival date & time: 01/15/21  1445     History Chief Complaint  Patient presents with   Seizures    Unwitnessed    Headache    Justin [name] is a 40 y.o. male with past medical history significant for HIV, endocarditis, seizures on Keppra.  HPI Patient presented to emergency department today via EMS with chief complaint of seizures and headache.  Patient states he had an unwitnessed seizure yesterday.  He denies falling or hitting his head.  He states today he had a headache which is what prompted him to come here.  He states he sometimes takes his Keppra, not always.  He thinks he did yesterday, does not remember exactly.  He denies any drug or alcohol use recently. He denies any recent illness. Denies fever, syncope, head trauma, photophobia, phonophobia, UL throbbing, N/V, visual changes, stiff neck, neck pain, rash, or "thunderclap" onset.   Note from RN at 16:16: Pt brought back to bed from triage.  Pt uncooperative and refused to stand/get into the bed.  Security assisted Pt into the bed. Triage staff noted Pt has been walking lobby and walked outside.  Pt being belligerent d/t not immediately getting a room.   Past Medical History:  Diagnosis Date   Endocarditis    Hepatitis C    History of syphilis 08/25/2014   Treated by [facility] 07-2014.   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   Amphetamine-induced mood disorder (HCC)  03/23/2020   Acute psychosis (HCC)    Substance use disorder 08/07/2019   Encephalopathy 08/06/2019   Chronic hepatitis C without hepatic coma (HCC) 08/17/2017   Screening examination for venereal disease 05/03/2017   Encounter for long-term (current) use of high-risk medication 05/03/2017   Anxiety 09/26/2016   Substance abuse (HCC) 09/26/2016   Human immunodeficiency virus (HIV) disease (HCC) 09/02/2014   History of syphilis 08/25/2014    Past Surgical History:  Procedure Laterality Date   hand surgery Right    LUMBAR PUNCTURE  08/08/2019       WRIST SURGERY         Family History  Adopted: Yes  Family history unknown: Yes    Social History   Tobacco Use   Smoking status: Every Day    Types: Cigarettes   Smokeless tobacco: Never  Vaping Use   Vaping Use: Never used  Substance Use Topics   Alcohol use: Yes    Comment: 1-2 a week    Drug use: Yes    Frequency: 14.0 times per week    Types: Marijuana, Methamphetamines    Home Medications Prior to Admission medications   Medication Sig Start Date End Date Taking? Authorizing Provider  levETIRAcetam (KEPPRA) 500 MG tablet Take 1 tablet (500 mg total) by mouth 2 (two) times daily. 01/15/21 02/14/21 Yes Walisiewicz, Keelin Neville E, PA-C  Ascorbic Acid (VITAMIN C PO) Take 1 tablet by mouth daily.    [provider]  B Complex Vitamins (VITAMIN B COMPLEX) TABS Take  1 tablet by mouth daily.    [provider]  bictegravir-emtricitabine-tenofovir AF (BIKTARVY) 50-200-25 MG TABS tablet Take 1 tablet by mouth daily. 07/16/20   [name], MD  POTASSIUM PO Take 1 tablet by mouth daily.    [provider]    Allergies    Patient has no known allergies.  Review of Systems   Review of Systems All other systems are reviewed and are negative for acute change except as noted in the HPI.  Physical Exam Updated Vital Signs BP 124/65   Pulse 66   Temp 99.9 F (37.7 C) (Oral)   Resp 16   SpO2 100%    Physical Exam Vitals and nursing note reviewed.  Constitutional:      General: He is not in acute distress.    Appearance: He is not ill-appearing.     Comments: Patient falling asleep during exam although will wake up to gentle shoulder tapping  HENT:     Head: Normocephalic and atraumatic.     Comments: No tenderness to palpation of skull. No deformities or crepitus noted. No open wounds, abrasions or lacerations.      Right Ear: Tympanic membrane and external ear normal.     Left Ear: Tympanic membrane and external ear normal.     Nose: Nose normal.     Mouth/Throat:     Mouth: Mucous membranes are moist.     Pharynx: Oropharynx is clear.  Eyes:     General: No scleral icterus.       Right eye: No discharge.        Left eye: No discharge.     Extraocular Movements: Extraocular movements intact.     Conjunctiva/sclera: Conjunctivae normal.     Pupils: Pupils are equal, round, and reactive to light.  Neck:     Vascular: No JVD.     Comments: Full ROM intact without spinous process TTP. No bony stepoffs or deformities, no paraspinous muscle TTP or muscle spasms. No rigidity or meningeal signs. No bruising, erythema, or swelling.   Cardiovascular:     Rate and Rhythm: Normal rate and regular rhythm.     Pulses: Normal pulses.          Radial pulses are 2+ on the right side and 2+ on the left side.     Heart sounds: Normal heart sounds. No murmur heard. Pulmonary:     Comments: Lungs clear to auscultation in all fields. Symmetric chest rise. No wheezing, rales, or rhonchi. Abdominal:     Comments: Abdomen is soft, non-distended, and non-tender in all quadrants. No rigidity, no guarding. No peritoneal signs.  Musculoskeletal:        General: Normal range of motion.     Cervical back: Normal range of motion.     Comments: Moving all extremities without signs of injury.  Skin:    General: Skin is warm and dry.     Capillary Refill: Capillary refill takes less than 2  seconds.     Findings: No rash.     Comments: No track marks seen on extremities. No signs of cellulitis on exposed skin. Patient is shirtless, wearing shorts.  Neurological:     Mental Status: He is oriented to person, place, and time.     GCS: GCS eye subscore is 4. GCS verbal subscore is 5. GCS motor subscore is 6.     Comments: Speech is clear and goal oriented, follows commands CN III-XII intact, no facial droop Normal strength  in upper and lower extremities bilaterally including dorsiflexion and plantar flexion, strong and equal grip strength Sensation normal to light and sharp touch Moves extremities without ataxia, coordination intact     Psychiatric:        Mood and Affect: Mood normal.        Speech: Speech normal.        Behavior: Behavior normal.        Thought Content: Thought content does not include homicidal or suicidal ideation.        Judgment: Judgment normal.    ED Results / Procedures / Treatments   Labs (all labs ordered are listed, but only abnormal results are displayed) Labs Reviewed  COMPREHENSIVE METABOLIC PANEL - Abnormal; Notable for the following components:      Result Value   Sodium 134 (*)    Potassium 3.4 (*)    Chloride 97 (*)    BUN 24 (*)    Creatinine, Ser 1.44 (*)    Total Protein 8.4 (*)    AST 51 (*)    Total Bilirubin 3.9 (*)    All other components within normal limits  CBC WITH DIFFERENTIAL/PLATELET - Abnormal; Notable for the following components:   WBC 11.5 (*)    Neutro Abs 9.5 (*)    All other components within normal limits  ETHANOL  MAGNESIUM  RAPID URINE DRUG SCREEN, HOSP PERFORMED  URINALYSIS, ROUTINE W REFLEX MICROSCOPIC  CBG MONITORING, ED    EKG None  Radiology No results found.  Procedures Procedures   Medications Ordered in ED Medications  levETIRAcetam (KEPPRA) tablet 500 mg (500 mg Oral Given 01/15/21 1740)  acetaminophen (TYLENOL) tablet 650 mg (650 mg Oral Given 01/15/21 1740)    ED Course  I  have reviewed the triage vital signs and the nursing notes.  Pertinent labs & imaging results that were available during my care of the patient were reviewed by me and considered in my medical decision making (see chart for details).    MDM Rules/Calculators/A&P                          History provided by patient with additional history obtained from chart review.    Patient presenting after unwitnessed seizure. Patient to be belligerent and aggressive towards staff because he was upset about not getting a room immediately. On my exam patient is resting comfortably on stretcher. Patient is not postictal. He is sleepy, however will wake up to voice and lightly tapping on the shoulder. He is afebrile, HDS. He is alert and oriented. He denies any pain to me on exam. Normal neuro exam, no focal weakness. No meningeal signs. Patient has psychiatric history although denies any suicidal or homicidal ideations. Glucose in triage is 77.  Plan is to continue observation, give patient dose of PO keppra and tylenol for recent headache and something to eat and check basic labs with UDS to r/o any electrolyte abnormalities or concerning results. No indications for head CT at this time.   CBC with nonspecific leukocytosis of 11.5, no anemia, normal platelets.  Patient has no infectious symptoms.  CMP with very mild electrolyte derangements, sodium 134, potassium 3.4.  BUN/creatinine slightly bumped, today is 24 and 1.44, compared to a month ago it was 14/1.15.  AST elevated at 51 and total bilirubin 2.9.  Patient has benign serial abdominal exams.  Low suspicion for acute surgical abdomen.  Ethanol is negative.  He is able to ambulate  here and clinically appears sober.  He never gave UDS to test for drug use.  Patient ate dinner and he tried to be discharged.  He continues to deny any suicidal homicidal ideations.  Does not appear to be responding to internal stimuli.  Does not appear to be in danger to himself or  others.  Patient is stable for discharge. Patient given printed prescription for Keppra and encouraged to take that.  Portions of this note were generated with Scientist, clinical (histocompatibility and immunogenetics)Dragon dictation software. Dictation errors may occur despite best attempts at proofreading.  Final Clinical Impression(s) / ED Diagnoses Final diagnoses:  Seizure-like activity [facility](HCC)    Rx / DC Orders ED Discharge Orders          Ordered    levETIRAcetam (KEPPRA) 500 MG tablet  2 times daily        01/15/21 1944             Shanon AceWalisiewicz, Hildegarde Dunaway E, PA-C 01/15/21 1958    Tilden Fossaees, Elizabeth, MD 01/18/21 1541

## 2021-01-18 ENCOUNTER — Encounter (HOSPITAL_COMMUNITY): Payer: Self-pay | Admitting: Emergency Medicine

## 2021-01-18 ENCOUNTER — Emergency Department (HOSPITAL_COMMUNITY)
Admission: EM | Admit: 2021-01-18 | Discharge: 2021-01-18 | Disposition: A | Discharge status: Home / Self Care | Payer: Self-pay | Attending: Emergency Medicine | Admitting: Emergency Medicine

## 2021-01-18 ENCOUNTER — Other Ambulatory Visit: Payer: Self-pay

## 2021-01-18 DIAGNOSIS — R443 Hallucinations, unspecified: Secondary | ICD-10-CM

## 2021-01-18 DIAGNOSIS — F17210 Nicotine dependence, cigarettes, uncomplicated: Secondary | ICD-10-CM | POA: Insufficient documentation

## 2021-01-18 DIAGNOSIS — R441 Visual hallucinations: Secondary | ICD-10-CM | POA: Insufficient documentation

## 2021-01-18 DIAGNOSIS — Z20822 Contact with and (suspected) exposure to covid-19: Secondary | ICD-10-CM | POA: Insufficient documentation

## 2021-01-18 DIAGNOSIS — F1514 Other stimulant abuse with stimulant-induced mood disorder: Secondary | ICD-10-CM | POA: Insufficient documentation

## 2021-01-18 DIAGNOSIS — Z21 Asymptomatic human immunodeficiency virus [HIV] infection status: Secondary | ICD-10-CM | POA: Insufficient documentation

## 2021-01-18 DIAGNOSIS — R569 Unspecified convulsions: Secondary | ICD-10-CM | POA: Insufficient documentation

## 2021-01-18 DIAGNOSIS — Z5900 Homelessness unspecified: Secondary | ICD-10-CM | POA: Insufficient documentation

## 2021-01-18 DIAGNOSIS — F418 Other specified anxiety disorders: Secondary | ICD-10-CM | POA: Insufficient documentation

## 2021-01-18 DIAGNOSIS — Y900 Blood alcohol level of less than 20 mg/100 ml: Secondary | ICD-10-CM | POA: Insufficient documentation

## 2021-01-18 LAB — SARS CORONAVIRUS 2 (TAT 6-24 HRS): SARS Coronavirus 2: NEGATIVE

## 2021-01-18 LAB — RAPID URINE DRUG SCREEN, HOSP PERFORMED
Amphetamines: POSITIVE — AB
Barbiturates: NOT DETECTED
Benzodiazepines: NOT DETECTED
Cocaine: NOT DETECTED
Opiates: NOT DETECTED
Tetrahydrocannabinol: NOT DETECTED

## 2021-01-18 LAB — CBC WITH DIFFERENTIAL/PLATELET
Abs Immature Granulocytes: 0.01 10*3/uL (ref 0.00–0.07)
Basophils Absolute: 0 10*3/uL (ref 0.0–0.1)
Basophils Relative: 0 %
Eosinophils Absolute: 0 10*3/uL (ref 0.0–0.5)
Eosinophils Relative: 1 %
HCT: 44.1 % (ref 39.0–52.0)
Hemoglobin: 14.8 g/dL (ref 13.0–17.0)
Immature Granulocytes: 0 %
Lymphocytes Relative: 40 %
Lymphs Abs: 2.8 10*3/uL (ref 0.7–4.0)
MCH: 32.9 pg (ref 26.0–34.0)
MCHC: 33.6 g/dL (ref 30.0–36.0)
MCV: 98 fL (ref 80.0–100.0)
Monocytes Absolute: 0.6 10*3/uL (ref 0.1–1.0)
Monocytes Relative: 9 %
Neutro Abs: 3.4 10*3/uL (ref 1.7–7.7)
Neutrophils Relative %: 50 %
Platelets: 210 10*3/uL (ref 150–400)
RBC: 4.5 MIL/uL (ref 4.22–5.81)
RDW: 12.6 % (ref 11.5–15.5)
WBC: 6.9 10*3/uL (ref 4.0–10.5)
nRBC: 0 % (ref 0.0–0.2)

## 2021-01-18 LAB — CBG MONITORING, ED: Glucose-Capillary: 100 mg/dL — ABNORMAL HIGH (ref 70–99)

## 2021-01-18 LAB — COMPREHENSIVE METABOLIC PANEL
ALT: 30 U/L (ref 0–44)
AST: 36 U/L (ref 15–41)
Albumin: 4.1 g/dL (ref 3.5–5.0)
Alkaline Phosphatase: 63 U/L (ref 38–126)
Anion gap: 12 (ref 5–15)
BUN: 8 mg/dL (ref 6–20)
CO2: 26 mmol/L (ref 22–32)
Calcium: 9.3 mg/dL (ref 8.9–10.3)
Chloride: 99 mmol/L (ref 98–111)
Creatinine, Ser: 0.99 mg/dL (ref 0.61–1.24)
GFR, Estimated: >60 mL/min (ref >60)
Glucose, Bld: 96 mg/dL (ref 70–99)
Potassium: 3.2 mmol/L — ABNORMAL LOW (ref 3.5–5.1)
Sodium: 137 mmol/L (ref 135–145)
Total Bilirubin: 2.6 mg/dL — ABNORMAL HIGH (ref 0.3–1.2)
Total Protein: 7.2 g/dL (ref 6.5–8.1)

## 2021-01-18 LAB — ETHANOL: Alcohol, Ethyl (B): <10 mg/dL (ref <10)

## 2021-01-18 LAB — SALICYLATE LEVEL: Salicylate Lvl: <7 mg/dL — ABNORMAL LOW (ref 7.0–30.0)
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
## 2021-01-18 NOTE — ED Provider Notes (Signed)
[name] EMERGENCY DEPARTMENT Provider Note   CSN: [id number] Arrival date & time: 01/18/21  0248     History Chief Complaint  Patient presents with   Seizures    Justin [name] is a 40 y.o. male.  Patient with history of HIV, substance abuse, homelessness, psychosis, seizures presents with concern for having a seizure tonight. He states he thinks he had a seizure because he woke up on the ground. No injury. No witnessed event. He states he was seen at [facility] several days ago and has been in jail since being discharged into custody from there. He has been given his regular medications while incarcerated. He denies fever or recent illness. He states tonight he having visual hallucinations of "women in trees and people with their nose cut off". He denies auditory hallucinations, SI/HI. He is asking for a meal.    Seizures     Past Medical History:  Diagnosis Date   Endocarditis    Hepatitis C    History of syphilis 08/25/2014   Treated by [facility] 07-2014.   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   Acute psychosis (HCC)    Substance use disorder 08/07/2019   Encephalopathy 08/06/2019   Chronic hepatitis C without hepatic coma (HCC) 08/17/2017   Screening examination for venereal disease 05/03/2017   Encounter for long-term (current) use of high-risk medication 05/03/2017   Anxiety 09/26/2016   Substance abuse (HCC) 09/26/2016    Human immunodeficiency virus (HIV) disease (HCC) 09/02/2014   History of syphilis 08/25/2014    Past Surgical History:  Procedure Laterality Date   hand surgery Right    LUMBAR PUNCTURE  08/08/2019       WRIST SURGERY         Family History  Adopted: Yes  Family history unknown: Yes    Social History   Tobacco Use   Smoking status: Every Day    Types: Cigarettes   Smokeless tobacco: Never  Vaping Use   Vaping Use: Never used  Substance Use Topics   Alcohol use: Yes    Comment: 1-2 a week    Drug use: Yes    Frequency: 14.0 times per week    Types: Marijuana, Methamphetamines    Home Medications Prior to Admission medications   Medication Sig Start Date End Date Taking? Authorizing Provider  Ascorbic Acid (VITAMIN C PO) Take 1 tablet by mouth daily.    [provider]  B Complex Vitamins (VITAMIN B COMPLEX) TABS Take 1 tablet by mouth daily.    [provider]  bictegravir-emtricitabine-tenofovir AF (BIKTARVY) 50-200-25 MG TABS tablet Take 1 tablet by mouth daily. 07/16/20   [name], MD  levETIRAcetam (KEPPRA) 500 MG tablet Take 1 tablet (500 mg total) by mouth 2 (two) times daily. 01/15/21 02/14/21  Walisiewicz, Caroleen Hamman, PA-C  POTASSIUM PO Take 1 tablet by mouth daily.    [provider]    Allergies    Patient has no known allergies.  Review of  Systems   Review of Systems  Constitutional:  Negative for chills and fever.  HENT: Negative.    Respiratory: Negative.    Cardiovascular: Negative.   Gastrointestinal: Negative.   Musculoskeletal: Negative.   Skin: Negative.   Neurological:  Positive for seizures.  Psychiatric/Behavioral:  Positive for hallucinations. Negative for self-injury and suicidal ideas.    Physical Exam Updated Vital Signs BP 128/89 (BP Location: Right Arm)   Pulse 70   Temp 98.9 F (37.2 C) (Oral)   Resp 19   Ht 6' (1.829 m)   Wt 90 kg   SpO2 100%   BMI 26.91 kg/m   Physical Exam Vitals and  nursing note reviewed.  Constitutional:      Appearance: He is well-developed.  HENT:     Head: Normocephalic.  Cardiovascular:     Rate and Rhythm: Normal rate and regular rhythm.     Heart sounds: No murmur heard. Pulmonary:     Effort: Pulmonary effort is normal.     Breath sounds: Normal breath sounds. No wheezing, rhonchi or rales.  Abdominal:     General: Bowel sounds are normal.     Palpations: Abdomen is soft.     Tenderness: There is no abdominal tenderness. There is no guarding or rebound.  Musculoskeletal:        General: Normal range of motion.     Cervical back: Normal range of motion and neck supple.  Skin:    General: Skin is warm and dry.  Neurological:     General: No focal deficit present.     Mental Status: He is alert and oriented to person, place, and time.  Psychiatric:        Attention and Perception: He perceives visual hallucinations.        Mood and Affect: Mood normal.        Speech: Speech normal.        Behavior: Behavior is cooperative.        Thought Content: Thought content does not include suicidal ideation. Thought content does not include suicidal plan.    ED Results / Procedures / Treatments   Labs (all labs ordered are listed, but only abnormal results are displayed) Labs Reviewed  COMPREHENSIVE METABOLIC PANEL - Abnormal; Notable for the following components:      Result Value   Potassium 3.2 (*)    Total Bilirubin 2.6 (*)    All other components within normal limits  CBG MONITORING, ED - Abnormal; Notable for the following components:   Glucose-Capillary 100 (*)    All other components within normal limits  CBC WITH DIFFERENTIAL/PLATELET  ETHANOL  SALICYLATE LEVEL  RAPID URINE DRUG SCREEN, HOSP PERFORMED    EKG None  Radiology No results found.  Procedures Procedures   Medications Ordered in ED Medications - No data to display  ED Course  I have reviewed the triage vital signs and the nursing notes.  Pertinent labs  & imaging results that were available during my care of the patient were reviewed by me and considered in my medical decision making (see chart for details).    MDM Rules/Calculators/A&P                          Patient to ED with c/o "I think I had a seizure". No injury.   He also reports visual hallucinations. Denies auditory hallucinations, SI/HI. Denies drug use, "its been a while". He denies history of psychosis or hallucinations  even when confronted with documented history of same earlier this year. "I don't remember that".   Patient with known mental health history presents with visual hallucinations. Denies SI/HI. Will have him psych cleared with TTS. He is asking for a meal immediately upon being seen which is felt to be his motivating reason for being here.    Final Clinical Impression(s) / ED Diagnoses Final diagnoses:  None   Visual hallucinations Homelessness   Rx / DC Orders ED Discharge Orders     None        Elpidio Anis, PA-C 01/18/21 0557    Gilda Crease, MD 01/18/21 [phone]

## 2021-01-18 NOTE — ED Notes (Signed)
Pt given turkey sandwich and drink 

## 2021-01-18 NOTE — Discharge Instructions (Addendum)
Follow up in the outpatient setting for further assistance with current symptoms.  Substance Abuse Resources  [facility] - Admissions are currently completed Monday through Friday at 8am; both appointments and walk-ins are accepted.  Any individual that is a [facility] resident may present for a substance abuse screening and assessment for admission.  A person may be referred by numerous sources or self-refer.   Potential clients will be screened for medical necessity and appropriateness for the program.  Clients must meet criteria for high-intensity residential treatment services.  If clinically appropriate, a client will continue with the comprehensive clinical assessment and intake process, as well as enrollment in the [facility].   Address: [address] [place], [place] [address] Admin Hours: Mon-Fri 8AM to [facility] Hours: 24/7 Phone: [phone] Fax: [phone]   [facility] (Detox) Facility Based Crisis:  These are 3 locations for services: Please call before arrival    Address: [address] [place], [place] [address] Phone: [phone]   Address: [address] [place], [place] [address] Phone#: [phone]   Address: [address] [place], [place] [address] Phone#: [phone]     [facility]): (offers outpatient therapy and intensive outpatient substance abuse therapy).  [address] [place], [place] [address] Phone: [phone]   [facility]: Offers FREE recovery skills classes, support groups, 1:1 Peer Support, and Compeer Classes. [address], [place], [place] [address] Phone: [phone] (Call to complete intake).  [facility] [address] [place], [place] [address] Phone: [phone] ext: [phone] The [facility] provides food, shelter and other programs and services to the homeless men of [place] through our [employer].   By offering safe shelter, three meals a day, clean clothing, Biblical counseling, financial planning, vocational training, GED/education and employment assistance, we've helped mend the shattered lives of many homeless men since opening in 1974.   We have approximately 267 beds available, with a max of 312 beds including mats for emergency situations and currently house an average of 270 men a night.   Prospective Client Check-In Information Photo ID Required (State/ Out of State/ [facility]) - if photo ID is not available, clients are required to have a printout of a police/sheriff's criminal history report. Help out with chores around the [facility]. No sex offender of any type (pending, charged, registered and/or any other sex related offenses) will be permitted to check in. Must be willing to abide by all rules, regulations, and policies established by the [employer]. The following will be provided - shelter, food, clothing, and biblical counseling. If you or someone you know is in need of assistance at our [facility] shelter in [place], [place], please call [phone] ext. [phone].   [facility]-will provide timely access to mental health services for children and adolescents (4-17) and adults presenting in a mental health crisis. The program is designed for those who need urgent Behavioral Health or Substance Use treatment and are not experiencing a medical crisis that would typically require an emergency room visit.    [address] [place], [place] [address] Phone: [phone] [email or web address]   [facility]: Phone#: [phone]   The [facility] SA Intensive Outpatient Program (SAIOP) means structured individual and group addiction activities and services that are provided at an outpatient program designed to assist adult and adolescent consumers to begin recovery and  learn skills for  recovery maintenance. The [facility] [facility] SAIOP program is offered at least 3 hours a day, 3 days a week.SAIOP services shall include a structured program consisting of, but not limited to, the following services: Individual counseling and support; Group counseling and support; Family counseling, training or support; Biochemical assays to identify recent drug use (e.g., urine drug screens); Strategies for relapse prevention to include community and social support systems in treatment; Life skills; Crisis contingency planning; Disease Management; and Treatment support activities that have been adapted or specifically designed for persons with physical disabilities, or persons with co-occurring disorders of mental illness and substance abuse/dependence or mental retardation/developmental disability and substance abuse/dependence. Phone: [phone]  Address:    The [facility] will also offer the following outpatient services: (Monday through Friday 8am-5pm)   Partial Hospitalization Program (PHP) Substance Abuse Intensive Outpatient Program (SA-IOP) Group Therapy Medication Management Peer Living Room We also provide (24/7):  Assessments: Our mental health clinician and providers will conduct a focused mental health evaluation, assessing for immediate safety concerns and further mental health needs. Referral: Our team will provide resources and help connect to community based mental health treatment, when indicated, including psychotherapy, psychiatry, and other specialized behavioral health or substance use disorder services (for those not already in treatment). Transitional Care: Our team providers in person bridging and/or telephonic follow-up during the patient's transition to outpatient services.   The [facility] 24-Hour Call Center: [phone]  Behavioral Health Crisis Line: [phone]

## 2021-01-18 NOTE — ED Provider Notes (Signed)
10:26 AM Signout from [employer] earlier. Pt pending TTS and has been psych cleared. Previously med cleared -- I reviewed labs, vitals and agree. Referrals provided.   BP 112/82 (BP Location: Right Arm)   Pulse 72   Temp 98.9 F (37.2 C) (Oral)   Resp 16   Ht 6' (1.829 m)   Wt 90 kg   SpO2 100%   BMI 26.91 kg/m       Renne Crigler, PA-C 01/18/21 1027    Linwood Dibbles, MD 01/19/21 [phone]

## 2021-01-18 NOTE — ED Triage Notes (Signed)
Patient reports seizure episode this evening , denies injury , alert and oriented /respirations unlabored.

## 2021-01-18 NOTE — ED Notes (Signed)
TTS at bedside. 

## 2021-01-18 NOTE — ED Notes (Addendum)
Pt states he came to the hospital today because he "keeps having seizures even though I am taking my medications and I am tired of dealing with all of this and am to the point where I want to just blow my brains out"  Pt states he is seeing people in trees and they are telling him he is on a reality tv show.

## 2021-01-18 NOTE — ED Notes (Signed)
Pt changed into purple scrubs, wanded by security and belongings placed in locker #3

## 2021-01-18 NOTE — ED Notes (Signed)
He passed his swallow screen at this time. I gave him sandwich and something to drink.

## 2021-01-18 NOTE — ED Notes (Signed)
Pt given back belongings and resource papers along with discharge instructions

## 2021-02-04 ENCOUNTER — Emergency Department (HOSPITAL_COMMUNITY)
Admission: EM | Admit: 2021-02-04 | Discharge: 2021-02-04 | Disposition: A | Discharge status: Home / Self Care | Payer: Self-pay | Attending: Emergency Medicine | Admitting: Emergency Medicine

## 2021-02-04 DIAGNOSIS — Z21 Asymptomatic human immunodeficiency virus [HIV] infection status: Secondary | ICD-10-CM | POA: Insufficient documentation

## 2021-02-04 DIAGNOSIS — R569 Unspecified convulsions: Secondary | ICD-10-CM | POA: Insufficient documentation

## 2021-02-04 DIAGNOSIS — Z79899 Other long term (current) drug therapy: Secondary | ICD-10-CM | POA: Insufficient documentation

## 2021-02-04 DIAGNOSIS — R55 Syncope and collapse: Secondary | ICD-10-CM | POA: Insufficient documentation

## 2021-02-04 DIAGNOSIS — F17210 Nicotine dependence, cigarettes, uncomplicated: Secondary | ICD-10-CM | POA: Insufficient documentation

## 2021-02-04 LAB — URINALYSIS, ROUTINE W REFLEX MICROSCOPIC
Glucose, UA: NEGATIVE mg/dL
Hgb urine dipstick: NEGATIVE
Ketones, ur: 15 mg/dL — AB
Leukocytes,Ua: NEGATIVE
Nitrite: NEGATIVE
Protein, ur: NEGATIVE mg/dL
Specific Gravity, Urine: >1.03 — ABNORMAL HIGH (ref 1.005–1.030)
pH: 5.5 (ref 5.0–8.0)

## 2021-02-04 LAB — CBC WITH DIFFERENTIAL/PLATELET
Abs Immature Granulocytes: 0.03 10*3/uL (ref 0.00–0.07)
Basophils Absolute: 0 10*3/uL (ref 0.0–0.1)
Basophils Relative: 0 %
Eosinophils Absolute: 0 10*3/uL (ref 0.0–0.5)
Eosinophils Relative: 1 %
HCT: 49.5 % (ref 39.0–52.0)
Hemoglobin: 16 g/dL (ref 13.0–17.0)
Immature Granulocytes: 0 %
Lymphocytes Relative: 24 %
Lymphs Abs: 2.1 10*3/uL (ref 0.7–4.0)
MCH: 32.5 pg (ref 26.0–34.0)
MCHC: 32.3 g/dL (ref 30.0–36.0)
MCV: 100.6 fL — ABNORMAL HIGH (ref 80.0–100.0)
Monocytes Absolute: 0.8 10*3/uL (ref 0.1–1.0)
Monocytes Relative: 10 %
Neutro Abs: 5.6 10*3/uL (ref 1.7–7.7)
Neutrophils Relative %: 65 %
Platelets: 229 10*3/uL (ref 150–400)
RBC: 4.92 MIL/uL (ref 4.22–5.81)
RDW: 12.3 % (ref 11.5–15.5)
WBC: 8.6 10*3/uL (ref 4.0–10.5)
nRBC: 0 % (ref 0.0–0.2)

## 2021-02-04 LAB — COMPREHENSIVE METABOLIC PANEL
ALT: 46 U/L — ABNORMAL HIGH (ref 0–44)
AST: 36 U/L (ref 15–41)
Albumin: 4.4 g/dL (ref 3.5–5.0)
Alkaline Phosphatase: 81 U/L (ref 38–126)
Anion gap: 8 (ref 5–15)
BUN: 9 mg/dL (ref 6–20)
CO2: 28 mmol/L (ref 22–32)
Calcium: 9.9 mg/dL (ref 8.9–10.3)
Chloride: 105 mmol/L (ref 98–111)
Creatinine, Ser: 1.38 mg/dL — ABNORMAL HIGH (ref 0.61–1.24)
GFR, Estimated: >60 mL/min (ref >60)
Glucose, Bld: 102 mg/dL — ABNORMAL HIGH (ref 70–99)
Potassium: 4.3 mmol/L (ref 3.5–5.1)
Sodium: 141 mmol/L (ref 135–145)
Total Bilirubin: 3.7 mg/dL — ABNORMAL HIGH (ref 0.3–1.2)
Total Protein: 7.5 g/dL (ref 6.5–8.1)

## 2021-02-04 LAB — RAPID URINE DRUG SCREEN, HOSP PERFORMED
Amphetamines: POSITIVE — AB
Barbiturates: NOT DETECTED
Benzodiazepines: NOT DETECTED
Cocaine: NOT DETECTED
Opiates: NOT DETECTED
Tetrahydrocannabinol: POSITIVE — AB

## 2021-02-04 LAB — CBG MONITORING, ED: Glucose-Capillary: 61 mg/dL — ABNORMAL LOW (ref 70–99)

## 2021-02-04 LAB — ETHANOL: Alcohol, Ethyl (B): <10 mg/dL (ref <10)

## 2021-02-04 MED ORDER — LEVETIRACETAM 500 MG PO TABS: 500.0000 mg | ORAL_TABLET | Freq: Two times a day (BID) | ORAL | 3 refills | Status: DC

## 2021-02-04 MED ORDER — LEVETIRACETAM 500 MG PO TABS
1000.0000 mg | ORAL_TABLET | Freq: Once | ORAL | Status: AC
  Administered 2021-02-04: 1000 mg via ORAL
  Filled 2021-02-04: qty 2

## 2021-02-04 NOTE — Discharge Instructions (Addendum)
Prescription provided to restart your Keppra.  Continue to follow-up with infectious disease as scheduled.  Make an appointment to follow-up with neurology information provided above.  Return for any new or worse symptoms.

## 2021-02-04 NOTE — ED Provider Notes (Signed)
Emergency Medicine Provider Triage Evaluation Note  Justin [name] , a 40 y.o. male  was evaluated in triage.  Pt complains of seizures today, states he had full body shaking, became incontinent, has a history of seizure disorders, currently on Keppra, has not seen neurologist last seizure 2 weeks ago.  Has no complaints this time.  Please see triage note from EMS.  Review of Systems  Positive: Seizure Negative: Headaches, change in vision  Physical Exam  BP 130/90   Pulse (!) 109   Temp 99 F (37.2 C) (Oral)   Resp 16   SpO2 99%  Gen:   Awake, no distress   Resp:  Normal effort  MSK:   Moves extremities without difficulty  Other:  No facial asymmetry, no difficulty word finding, able to follow commands, no slurring of his words, no unilateral weakness.  Medical Decision Making  Medically screening exam initiated at 1:22 PM.  Appropriate orders placed.  Isaul L Willets was informed that the remainder of the evaluation will be completed by another provider, this initial triage assessment does not replace that evaluation, and the importance of remaining in the ED until their evaluation is complete.  Presents with seizures, he will need further work-up.   Carroll Sage, PA-C 02/04/21 1324    Vanetta Mulders, MD 02/04/21 Flossie Buffy

## 2021-02-04 NOTE — ED Notes (Signed)
Pt given sandwiches and soda for CBG of 61

## 2021-02-04 NOTE — ED Provider Notes (Signed)
[name] EMERGENCY DEPARTMENT Provider Note   CSN: [id number] Arrival date & time: 02/04/21  1238     History Chief Complaint  Patient presents with   Loss of Consciousness    Justin [name] is a 40 y.o. male.  Patient with a known history of seizure disorder, HIV, endocarditis and hepatitis C.  Patient seen July 15 for seizure-like activity.  Seen July 18 for hallucinations released by behavioral health for outpatient follow-up.  Patient states he has been out of his Keppra.  Patient was brought in by EMS.  Patient does not know who the 911 call was 4.  Patient believes he had an unwitnessed seizure.  Patient states that he is got some memory loss.  Patient states he feels better now.  Patient states he is taking his antivirals.      Past Medical History:  Diagnosis Date   Endocarditis    Hepatitis C    History of syphilis 08/25/2014   Treated by [facility] 07-2014.   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   Acute psychosis (HCC)    Substance use disorder 08/07/2019   Encephalopathy 08/06/2019   Chronic hepatitis C without hepatic coma (HCC) 08/17/2017   Screening examination for venereal disease 05/03/2017   Encounter for long-term (current) use of high-risk medication 05/03/2017   Anxiety 09/26/2016   Substance abuse (HCC) 09/26/2016   Human immunodeficiency virus (HIV) disease (HCC) 09/02/2014   History of syphilis 08/25/2014     Past Surgical History:  Procedure Laterality Date   hand surgery Right    LUMBAR PUNCTURE  08/08/2019       WRIST SURGERY         Family History  Adopted: Yes  Family history unknown: Yes    Social History   Tobacco Use   Smoking status: Every Day    Types: Cigarettes   Smokeless tobacco: Never  Vaping Use   Vaping Use: Never used  Substance Use Topics   Alcohol use: Yes    Comment: 1-2 a week    Drug use: Yes    Frequency: 14.0 times per week    Types: Marijuana, Methamphetamines    Home Medications Prior to Admission medications   Medication Sig Start Date End Date Taking? Authorizing Provider  Ascorbic Acid (VITAMIN C PO) Take 1 tablet by mouth daily.    [provider]  B Complex Vitamins (VITAMIN B COMPLEX) TABS Take 1 tablet by mouth daily.    [provider]  bictegravir-emtricitabine-tenofovir AF (BIKTARVY) 50-200-25 MG TABS tablet Take 1 tablet by mouth daily. 07/16/20   [name], MD  levETIRAcetam (KEPPRA) 500 MG tablet Take 1 tablet (500 mg total) by mouth 2 (two) times daily. 01/15/21 02/14/21  Walisiewicz, Caroleen Hamman, PA-C  POTASSIUM PO Take 1 tablet by mouth daily.    [provider]    Allergies    Patient has no known allergies.  Review of Systems   Review of Systems  Constitutional:  Negative for  chills and fever.  HENT:  Negative for ear pain and sore throat.   Eyes:  Negative for pain and visual disturbance.  Respiratory:  Negative for cough and shortness of breath.   Cardiovascular:  Negative for chest pain and palpitations.  Gastrointestinal:  Negative for abdominal pain and vomiting.  Genitourinary:  Negative for dysuria and hematuria.  Musculoskeletal:  Negative for arthralgias and back pain.  Skin:  Negative for color change and rash.  Neurological:  Positive for seizures. Negative for syncope.  Psychiatric/Behavioral:  Positive for confusion.   All other systems reviewed and are negative.  Physical  Exam Updated Vital Signs BP 113/63   Pulse 95   Temp 99 F (37.2 C) (Oral)   Resp 18   SpO2 100%   Physical Exam Vitals and nursing note reviewed.  Constitutional:      Appearance: Normal appearance. He is well-developed.  HENT:     Head: Normocephalic and atraumatic.  Eyes:     Extraocular Movements: Extraocular movements intact.     Conjunctiva/sclera: Conjunctivae normal.     Pupils: Pupils are equal, round, and reactive to light.  Cardiovascular:     Rate and Rhythm: Normal rate and regular rhythm.     Heart sounds: No murmur heard. Pulmonary:     Effort: Pulmonary effort is normal. No respiratory distress.     Breath sounds: Normal breath sounds.  Abdominal:     Palpations: Abdomen is soft.     Tenderness: There is no abdominal tenderness.  Musculoskeletal:        General: Normal range of motion.     Cervical back: Normal range of motion and neck supple.  Skin:    General: Skin is warm and dry.  Neurological:     General: No focal deficit present.     Mental Status: He is alert and oriented to person, place, and time.     Cranial Nerves: No cranial nerve deficit.     Sensory: No sensory deficit.     Motor: No weakness.    ED Results / Procedures / Treatments   Labs (all labs ordered are listed, but only abnormal results are displayed) Labs Reviewed  COMPREHENSIVE METABOLIC PANEL - Abnormal; Notable for the following components:      Result Value   Glucose, Bld 102 (*)    Creatinine, Ser 1.38 (*)    ALT 46 (*)    Total Bilirubin 3.7 (*)    All other components within normal limits  CBC WITH DIFFERENTIAL/PLATELET - Abnormal; Notable for the following components:   MCV 100.6 (*)    All other components within normal limits  URINALYSIS, ROUTINE W REFLEX MICROSCOPIC - Abnormal; Notable for the following components:   Specific Gravity, Urine >1.030 (*)    Bilirubin Urine MODERATE (*)    Ketones, ur 15 (*)    All other components within normal limits  RAPID  URINE DRUG SCREEN, HOSP PERFORMED - Abnormal; Notable for the following components:   Amphetamines POSITIVE (*)    Tetrahydrocannabinol POSITIVE (*)    All other components within normal limits  CBG MONITORING, ED - Abnormal; Notable for the following components:   Glucose-Capillary 61 (*)    All other components within normal limits  ETHANOL    EKG EKG Interpretation  Date/Time:  Thursday February 04 2021 13:09:28 EDT Ventricular Rate:  115 PR Interval:  126 QRS Duration: 94 QT Interval:  326 QTC Calculation: 450 R Axis:   -50 Text Interpretation: Sinus tachycardia Right  atrial enlargement Pulmonary disease pattern Left anterior fascicular block Abnormal ECG Confirmed by Vanetta Mulders [phone]) on 02/04/2021 6:05:06 PM  Radiology No results found.  Procedures Procedures   Medications Ordered in ED Medications - No data to display  ED Course  I have reviewed the triage vital signs and the nursing notes.  Pertinent labs & imaging results that were available during my care of the patient were reviewed by me and considered in my medical decision making (see chart for details).    MDM Rules/Calculators/A&P                           Patient given 1000 mg of Keppra orally here.  Labs without significant abnormalities.  Urine drug screen positive for amphetamines and THC.  Patient stable for discharge home.  Patient will be fed.  Patient given referral to neurology for follow-up.  Patient will continue to follow-up with infectious disease  Final Clinical Impression(s) / ED Diagnoses Final diagnoses:  Seizure [facility])    Rx / DC Orders ED Discharge Orders     None        Vanetta Mulders, MD 02/04/21 2220

## 2021-02-04 NOTE — ED Triage Notes (Signed)
Patient reports that he woke up in a hotel room alone and then an ambulance arrived to pick him up. Patient states he does not know who called 911. Patient alert and oriented at this time, states he takes 500mg  keppra BID. Denies missing doses, complains of being thirsty and intermittent memory loss.

## 2021-02-04 NOTE — ED Notes (Signed)
Patient BIB [facility] after being trespassed from motel that he was living in. Patient states he has been noncompliant with medications but is cooperative with staff.

## 2021-02-04 NOTE — ED Notes (Signed)
Patient angry, threatening staff because he was not yet able to be provided with a meal tray. Patient slamming objects against the ground while threatening staff, demanding he be provided with a meal and shouting "I don't have any fucking money".

## 2021-02-08 ENCOUNTER — Other Ambulatory Visit: Payer: Self-pay

## 2021-02-08 DIAGNOSIS — B20 Human immunodeficiency virus [HIV] disease: Secondary | ICD-10-CM

## 2021-02-09 ENCOUNTER — Other Ambulatory Visit: Payer: Self-pay

## 2021-02-09 ENCOUNTER — Encounter (HOSPITAL_COMMUNITY): Payer: Self-pay | Admitting: Emergency Medicine

## 2021-02-09 ENCOUNTER — Emergency Department (HOSPITAL_COMMUNITY)
Admission: EM | Admit: 2021-02-09 | Discharge: 2021-02-09 | Disposition: A | Discharge status: Home / Self Care | Payer: Self-pay | Attending: Emergency Medicine | Admitting: Emergency Medicine

## 2021-02-09 ENCOUNTER — Ambulatory Visit: Payer: Self-pay

## 2021-02-09 ENCOUNTER — Emergency Department (HOSPITAL_COMMUNITY): Payer: Self-pay

## 2021-02-09 DIAGNOSIS — F17210 Nicotine dependence, cigarettes, uncomplicated: Secondary | ICD-10-CM | POA: Insufficient documentation

## 2021-02-09 DIAGNOSIS — Z21 Asymptomatic human immunodeficiency virus [HIV] infection status: Secondary | ICD-10-CM | POA: Insufficient documentation

## 2021-02-09 DIAGNOSIS — R0602 Shortness of breath: Secondary | ICD-10-CM | POA: Insufficient documentation

## 2021-02-09 DIAGNOSIS — R079 Chest pain, unspecified: Secondary | ICD-10-CM | POA: Insufficient documentation

## 2021-02-09 DIAGNOSIS — B20 Human immunodeficiency virus [HIV] disease: Secondary | ICD-10-CM

## 2021-02-09 LAB — BASIC METABOLIC PANEL
Anion gap: 5 (ref 5–15)
BUN: 7 mg/dL (ref 6–20)
CO2: 28 mmol/L (ref 22–32)
Calcium: 9.3 mg/dL (ref 8.9–10.3)
Chloride: 105 mmol/L (ref 98–111)
Creatinine, Ser: 1.27 mg/dL — ABNORMAL HIGH (ref 0.61–1.24)
GFR, Estimated: >60 mL/min (ref >60)
Glucose, Bld: 109 mg/dL — ABNORMAL HIGH (ref 70–99)
Potassium: 3.3 mmol/L — ABNORMAL LOW (ref 3.5–5.1)
Sodium: 138 mmol/L (ref 135–145)

## 2021-02-09 LAB — CBC
HCT: 45.1 % (ref 39.0–52.0)
Hemoglobin: 14.6 g/dL (ref 13.0–17.0)
MCH: 32.7 pg (ref 26.0–34.0)
MCHC: 32.4 g/dL (ref 30.0–36.0)
MCV: 101.1 fL — ABNORMAL HIGH (ref 80.0–100.0)
Platelets: 211 10*3/uL (ref 150–400)
RBC: 4.46 MIL/uL (ref 4.22–5.81)
RDW: 12.1 % (ref 11.5–15.5)
WBC: 6.1 10*3/uL (ref 4.0–10.5)
nRBC: 0 % (ref 0.0–0.2)

## 2021-02-09 LAB — TROPONIN I (HIGH SENSITIVITY)
Troponin I (High Sensitivity): 4 ng/L (ref <18)
Troponin I (High Sensitivity): 3 ng/L (ref <18)

## 2021-02-09 NOTE — ED Provider Notes (Signed)
[name] EMERGENCY DEPARTMENT Provider Note   CSN: [id number] Arrival date & time: 02/09/21  0435     History Chief Complaint  Patient presents with   Chest Pain    Justin [name] is a 40 y.o. male.  Patient presents to ER chief complaint of chest pain.  Describes a sharp chest pain radiating from the left side to the right side of his chest started yesterday.  Lasted for several minutes before resolving.  Currently denies any chest pain or chest discomfort.  No reports of fevers or cough or vomiting or diarrhea.  Denies shortness of breath or diaphoresis.       Past Medical History:  Diagnosis Date   Endocarditis    Hepatitis C    History of syphilis 08/25/2014   Treated by [facility] 07-2014.   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   Acute psychosis (HCC)    Substance use disorder 08/07/2019   Encephalopathy 08/06/2019   Chronic hepatitis C without hepatic coma (HCC) 08/17/2017   Screening examination for venereal disease 05/03/2017   Encounter for long-term (current) use of high-risk medication 05/03/2017   Anxiety 09/26/2016   Substance abuse (HCC) 09/26/2016   Human immunodeficiency virus (HIV) disease (HCC) 09/02/2014   History of syphilis 08/25/2014    Past Surgical History:  Procedure Laterality Date   hand surgery Right    LUMBAR PUNCTURE  08/08/2019       WRIST SURGERY         Family History  Adopted: Yes   Family history unknown: Yes    Social History   Tobacco Use   Smoking status: Every Day    Types: Cigarettes   Smokeless tobacco: Never  Vaping Use   Vaping Use: Never used  Substance Use Topics   Alcohol use: Yes    Comment: 1-2 a week    Drug use: Yes    Frequency: 14.0 times per week    Types: Marijuana, Methamphetamines    Home Medications Prior to Admission medications   Medication Sig Start Date End Date Taking? Authorizing Provider  Ascorbic Acid (VITAMIN C PO) Take 1 tablet by mouth daily.    [provider]  B Complex Vitamins (VITAMIN B COMPLEX) TABS Take 1 tablet by mouth daily.    [provider]  bictegravir-emtricitabine-tenofovir AF (BIKTARVY) 50-200-25 MG TABS tablet Take 1 tablet by mouth daily. 07/16/20   [name], MD  levETIRAcetam (KEPPRA) 500 MG tablet Take 1 tablet (500 mg total) by mouth 2 (two) times daily. 01/15/21 02/14/21  Shanon Ace, PA-C  levETIRAcetam (KEPPRA) 500 MG tablet Take 1 tablet (500 mg total) by mouth 2 (two) times daily. 02/04/21   Vanetta Mulders, MD  POTASSIUM PO Take 1 tablet by mouth daily.    [provider]    Allergies    Patient has no known allergies.  Review of Systems   Review of Systems  Constitutional:  Negative for fever.  HENT:  Negative for ear pain  and sore throat.   Eyes:  Negative for pain.  Respiratory:  Negative for cough.   Cardiovascular:  Positive for chest pain.  Gastrointestinal:  Negative for abdominal pain.  Genitourinary:  Negative for flank pain.  Musculoskeletal:  Negative for back pain.  Skin:  Negative for color change and rash.  Neurological:  Negative for syncope.  All other systems reviewed and are negative.  Physical Exam Updated Vital Signs BP (!) 115/92 (BP Location: Right Arm)   Pulse 89   Temp (!) 97.5 F (36.4 C) (Oral)   Resp 16   Ht 5\' 11"  (1.803 m)   Wt 90.3 kg   SpO2 100%   BMI 27.75 kg/m   Physical Exam Constitutional:       Appearance: He is well-developed.  HENT:     Head: Normocephalic.     Nose: Nose normal.  Eyes:     Extraocular Movements: Extraocular movements intact.  Cardiovascular:     Rate and Rhythm: Normal rate.  Pulmonary:     Effort: Pulmonary effort is normal.  Skin:    Coloration: Skin is not jaundiced.  Neurological:     Mental Status: He is alert. Mental status is at baseline.    ED Results / Procedures / Treatments   Labs (all labs ordered are listed, but only abnormal results are displayed) Labs Reviewed  BASIC METABOLIC PANEL - Abnormal; Notable for the following components:      Result Value   Potassium 3.3 (*)    Glucose, Bld 109 (*)    Creatinine, Ser 1.27 (*)    All other components within normal limits  CBC - Abnormal; Notable for the following components:   MCV 101.1 (*)    All other components within normal limits  TROPONIN I (HIGH SENSITIVITY)  TROPONIN I (HIGH SENSITIVITY)    EKG EKG Interpretation  Date/Time:  Tuesday February 09 2021 04:48:24 EDT Ventricular Rate:  91 PR Interval:  134 QRS Duration: 101 QT Interval:  372 QTC Calculation: 458 R Axis:   -19 Text Interpretation: Sinus rhythm Borderline left axis deviation When compared with ECG of 02/04/2021, No significant change was found Confirmed by 04/06/2021 (Dione Booze) on 02/09/2021 5:24:35 AM  Radiology DG Chest 2 View  Result Date: 02/09/2021 CLINICAL DATA:  Worsening chest pain and shortness of breath EXAM: CHEST - 2 VIEW COMPARISON:  08/16/2020 FINDINGS: The heart size and mediastinal contours are within normal limits. Both lungs are clear. The visualized skeletal structures are unremarkable. IMPRESSION: No active cardiopulmonary disease. Electronically Signed   By: 08/18/2020 M.D.   On: 02/09/2021 05:40    Procedures Procedures   Medications Ordered in ED Medications - No data to display  ED Course  I have reviewed the triage vital signs and the nursing notes.  Pertinent labs & imaging  results that were available during my care of the patient were reviewed by me and considered in my medical decision making (see chart for details).  Clinical Course as of 02/09/21 04/11/21  Tue Feb 09, 2021  Feb 11, 2021 EKG 12-Lead [JH]    Clinical Course User Index [JH] 6256 Audley Hose, MD   MDM Rules/Calculators/A&P                           Work-up is unremarkable.  Troponin negative x2 chemistry normal hemoglobin normal white count normal.  Chest imaging is also unremarkable no acute findings per radiologist.  Pulmonary embolism considered, but he has  no leg swelling, no prior history and heart rate was initially tachycardic on arrival but currently is normalized.  He states he remains asymptomatic at this time.  Will recommend outpatient follow-up with his doctors within the week.  Recommending immediate return for worsening symptoms or any additional concerns.  Final Clinical Impression(s) / ED Diagnoses Final diagnoses:  Nonspecific chest pain    Rx / DC Orders ED Discharge Orders     None        Cheryll Cockayne, MD 02/09/21 [phone]

## 2021-02-09 NOTE — ED Provider Notes (Signed)
Emergency Medicine Provider Triage Evaluation Note  Justin [name] , a 40 y.o. male  was evaluated in triage.  Pt complains of worsening chest pain since yesterday. Feels short of breath. Complains of memory loss, and feels like he may have a seizure. Hx of seizure-activity, on Keppra and compliant with medications.   Review of Systems  Positive: CP, SOB, palpitations, nausea, visual changes Negative: Leg swelling, syncope, seizure, dizziness  Physical Exam  BP 123/81 (BP Location: Right Arm)   Pulse (!) 104   Temp 97.8 F (36.6 C) (Axillary)   Resp 16   Ht 5\' 11"  (1.803 m)   Wt 90.3 kg   SpO2 98%   BMI 27.75 kg/m  Gen:   Awake, no distress   Resp:  Normal effort  Cardio:  Tachy, normal rhythm MSK:   Moves extremities without difficulty   Medical Decision Making  Medically screening exam initiated at 4:53 AM.  Appropriate orders placed.  Justin [name] was informed that the remainder of the evaluation will be completed by another provider, this initial triage assessment does not replace that evaluation, and the importance of remaining in the ED until their evaluation is complete.   , PA-C 02/09/21 0454    04/11/21, MD 02/09/21 [phone]

## 2021-02-09 NOTE — ED Notes (Signed)
Patient ambulated from lobby to room stating he is feeling much better now.

## 2021-02-09 NOTE — Discharge Instructions (Addendum)
Call your primary care doctor or specialist as discussed in the next 2-3 days.   Return immediately back to the ER if:  Your symptoms worsen within the next 12-24 hours. You develop new symptoms such as new fevers, persistent vomiting, new pain, shortness of breath, or new weakness or numbness, or if you have any other concerns.  

## 2021-02-09 NOTE — ED Triage Notes (Signed)
Patient here with chest pain, shortness of breath and nausea.  He states that the pain is shooting in nature, moves from left side of chest to the center.  He states that he is having some memory issues also.  The chest pain started yesterday morning.

## 2021-02-10 LAB — T-HELPER CELL (CD4) - (RCID CLINIC ONLY)
CD4 % Helper T Cell: 32 % — ABNORMAL LOW (ref 33–65)
CD4 T Cell Abs: 534 /uL (ref 400–1790)

## 2021-02-11 LAB — CBC WITH DIFFERENTIAL/PLATELET
Absolute Monocytes: 442 cells/uL (ref 200–950)
Basophils Absolute: 27 cells/uL (ref 0–200)
Basophils Relative: 0.4 %
Eosinophils Absolute: 41 cells/uL (ref 15–500)
Eosinophils Relative: 0.6 %
HCT: 46 % (ref 38.5–50.0)
Hemoglobin: 15.5 g/dL (ref 13.2–17.1)
Lymphs Abs: 1897 cells/uL (ref 850–3900)
MCH: 32.6 pg (ref 27.0–33.0)
MCHC: 33.7 g/dL (ref 32.0–36.0)
MCV: 96.8 fL (ref 80.0–100.0)
MPV: 10.2 fL (ref 7.5–12.5)
Monocytes Relative: 6.5 %
Neutro Abs: 4393 cells/uL (ref 1500–7800)
Neutrophils Relative %: 64.6 %
Platelets: 202 10*3/uL (ref 140–400)
RBC: 4.75 10*6/uL (ref 4.20–5.80)
RDW: 11.6 % (ref 11.0–15.0)
Total Lymphocyte: 27.9 %
WBC: 6.8 10*3/uL (ref 3.8–10.8)

## 2021-02-11 LAB — COMPLETE METABOLIC PANEL WITH GFR
AG Ratio: 1.7 (calc) (ref 1.0–2.5)
ALT: 32 U/L (ref 9–46)
AST: 29 U/L (ref 10–40)
Albumin: 4.6 g/dL (ref 3.6–5.1)
Alkaline phosphatase (APISO): 85 U/L (ref 36–130)
BUN: 9 mg/dL (ref 7–25)
CO2: 30 mmol/L (ref 20–32)
Calcium: 9.7 mg/dL (ref 8.6–10.3)
Chloride: 104 mmol/L (ref 98–110)
Creat: 1.16 mg/dL (ref 0.60–1.29)
Globulin: 2.7 g/dL (calc) (ref 1.9–3.7)
Glucose, Bld: 88 mg/dL (ref 65–99)
Potassium: 3.9 mmol/L (ref 3.5–5.3)
Sodium: 142 mmol/L (ref 135–146)
Total Bilirubin: 1.5 mg/dL — ABNORMAL HIGH (ref 0.2–1.2)
Total Protein: 7.3 g/dL (ref 6.1–8.1)
eGFR: 82 mL/min/{1.73_m2} (ref >=60)

## 2021-02-11 LAB — HIV-1 RNA QUANT-NO REFLEX-BLD
HIV 1 RNA Quant: NOT DETECTED Copies/mL
HIV-1 RNA Quant, Log: NOT DETECTED Log cps/mL

## 2021-02-17 ENCOUNTER — Emergency Department (HOSPITAL_COMMUNITY)
Admission: EM | Admit: 2021-02-17 | Discharge: 2021-02-17 | Disposition: A | Discharge status: Home / Self Care | Payer: Self-pay | Attending: Emergency Medicine | Admitting: Emergency Medicine

## 2021-02-17 ENCOUNTER — Encounter (HOSPITAL_COMMUNITY): Payer: Self-pay

## 2021-02-17 ENCOUNTER — Other Ambulatory Visit: Payer: Self-pay

## 2021-02-17 ENCOUNTER — Emergency Department (HOSPITAL_COMMUNITY): Payer: Self-pay

## 2021-02-17 DIAGNOSIS — Z046 Encounter for general psychiatric examination, requested by authority: Secondary | ICD-10-CM | POA: Insufficient documentation

## 2021-02-17 DIAGNOSIS — F332 Major depressive disorder, recurrent severe without psychotic features: Secondary | ICD-10-CM | POA: Insufficient documentation

## 2021-02-17 DIAGNOSIS — Y900 Blood alcohol level of less than 20 mg/100 ml: Secondary | ICD-10-CM | POA: Insufficient documentation

## 2021-02-17 DIAGNOSIS — F32A Depression, unspecified: Secondary | ICD-10-CM

## 2021-02-17 DIAGNOSIS — T50901A Poisoning by unspecified drugs, medicaments and biological substances, accidental (unintentional), initial encounter: Secondary | ICD-10-CM | POA: Insufficient documentation

## 2021-02-17 DIAGNOSIS — R400 Somnolence: Secondary | ICD-10-CM | POA: Insufficient documentation

## 2021-02-17 DIAGNOSIS — Z21 Asymptomatic human immunodeficiency virus [HIV] infection status: Secondary | ICD-10-CM | POA: Insufficient documentation

## 2021-02-17 DIAGNOSIS — F17210 Nicotine dependence, cigarettes, uncomplicated: Secondary | ICD-10-CM | POA: Insufficient documentation

## 2021-02-17 DIAGNOSIS — X58XXXA Exposure to other specified factors, initial encounter: Secondary | ICD-10-CM | POA: Insufficient documentation

## 2021-02-17 DIAGNOSIS — Z20822 Contact with and (suspected) exposure to covid-19: Secondary | ICD-10-CM | POA: Insufficient documentation

## 2021-02-17 LAB — COMPREHENSIVE METABOLIC PANEL
ALT: 25 U/L (ref 0–44)
AST: 32 U/L (ref 15–41)
Albumin: 4.5 g/dL (ref 3.5–5.0)
Alkaline Phosphatase: 76 U/L (ref 38–126)
Anion gap: 9 (ref 5–15)
BUN: 16 mg/dL (ref 6–20)
CO2: 27 mmol/L (ref 22–32)
Calcium: 9.3 mg/dL (ref 8.9–10.3)
Chloride: 106 mmol/L (ref 98–111)
Creatinine, Ser: 1.22 mg/dL (ref 0.61–1.24)
GFR, Estimated: >60 mL/min (ref >60)
Glucose, Bld: 108 mg/dL — ABNORMAL HIGH (ref 70–99)
Potassium: 3.5 mmol/L (ref 3.5–5.1)
Sodium: 142 mmol/L (ref 135–145)
Total Bilirubin: 1.2 mg/dL (ref 0.3–1.2)
Total Protein: 7.5 g/dL (ref 6.5–8.1)

## 2021-02-17 LAB — ACETAMINOPHEN LEVEL: Acetaminophen (Tylenol), Serum: <10 ug/mL — ABNORMAL LOW (ref 10–30)

## 2021-02-17 LAB — CBC
HCT: 46.3 % (ref 39.0–52.0)
Hemoglobin: 15 g/dL (ref 13.0–17.0)
MCH: 32.8 pg (ref 26.0–34.0)
MCHC: 32.4 g/dL (ref 30.0–36.0)
MCV: 101.3 fL — ABNORMAL HIGH (ref 80.0–100.0)
Platelets: 195 10*3/uL (ref 150–400)
RBC: 4.57 MIL/uL (ref 4.22–5.81)
RDW: 12.7 % (ref 11.5–15.5)
WBC: 7.9 10*3/uL (ref 4.0–10.5)
nRBC: 0 % (ref 0.0–0.2)

## 2021-02-17 LAB — SALICYLATE LEVEL: Salicylate Lvl: <7 mg/dL — ABNORMAL LOW (ref 7.0–30.0)

## 2021-02-17 LAB — CBG MONITORING, ED: Glucose-Capillary: 158 mg/dL — ABNORMAL HIGH (ref 70–99)

## 2021-02-17 LAB — RESP PANEL BY RT-PCR (FLU A&B, COVID) ARPGX2
Influenza A by PCR: NEGATIVE
Influenza B by PCR: NEGATIVE
SARS Coronavirus 2 by RT PCR: NEGATIVE

## 2021-02-17 LAB — ETHANOL: Alcohol, Ethyl (B): <10 mg/dL (ref <10)

## 2021-02-17 LAB — TROPONIN I (HIGH SENSITIVITY)
Troponin I (High Sensitivity): 3 ng/L (ref <18)
Troponin I (High Sensitivity): 7 ng/L (ref <18)

## 2021-02-17 MED ORDER — SODIUM CHLORIDE 0.9 % IV BOLUS
1000.0000 mL | Freq: Once | INTRAVENOUS | Status: AC
  Administered 2021-02-17: 1000 mL via INTRAVENOUS

## 2021-02-17 NOTE — Discharge Instructions (Addendum)
Please follow up with one of the resources provided.  We hope you can make changes and continue to feel better.  For your mental health needs you are advised to follow up with [facility] at your earliest opportunity:       [facility]      [address]      [place], [place] [address]      [phone]      They offer psychiatry/medication management and therapy.  New patients are seen in their walk-in clinic.  Walk-in hours are Monday - Thursday from 8:00 am - 11:00 am for psychiatry, and Friday from 1:00 pm - 4:00 pm for therapy.  Walk-in patients are seen on a first come, first served basis, so try to arrive as early as possible for the best chance of being seen the same day.   To help you maintain a sober lifestyle, a substance use disorder treatment program may be beneficial to you.  Contact one of the following providers at your earliest opportunity to ask about enrolling their program:  RESIDENTIAL REHAB PROGRAMS - CHARITABLE:       [facility]      [address] [place] [address]      [phone]       [facility]      [phone] [address] [place] [address]      [phone] II      [address]      [place], [place] [address]      [id number], [place] [address]      [phone]       [facility] (Rehab for men only)      [address] [place], [place] [address]      [phone]  [facility] - MEDICAID/IPRS:       [facility]      [address] [place], [place] [address]      [phone]       [facility]      [address]      [place], [place] [address]      [phone]       [facility]      [address]      [place], [place] [address]      [phone]       [facility]      [address]      [place], [place] [address]      [phone]  OUTPATIENT SUBSTANCE ABUSE COUNSELING:        [facility]      [address]      [place], [place] [address]      [phone]       New patients are seen at their walk-in clinic.  Walk-in hours are Monday - Friday from 8:30 am - 12:00 pm, and from 1:00 pm - 2:30 pm.  Walk-in patients are seen on a first come, first served basis, so try to arrive as early as possible for the best chance of being seen the same  day.

## 2021-02-17 NOTE — BH Assessment (Signed)
[facility] Assessment Progress Note   Per Dorena Bodo, NP, this voluntary pt does not require psychiatric hospitalization at this time.  Pt is psychiatrically cleared.  Discharge instructions include referral information for area substance abuse treatment providers.  EDP Alvira Monday, MD and pt's nurse, Abby, have been notified.  Doylene Canning, MA Triage Specialist [phone]

## 2021-02-17 NOTE — Progress Notes (Signed)
CSW contacted safe transport for ride upon d/c.  Valentina Shaggy.Rashawnda Gaba, MSW, LCSWA [facility]  Transitions of Care Clinical Social Worker I Direct Dial: [phone]  Fax: [phone] [email or web address]

## 2021-02-17 NOTE — ED Provider Notes (Signed)
[facility]-EMERGENCY DEPT [facility] Emergency Department Provider Note MRN:  [id number]  Arrival date & time: 02/17/21     Chief Complaint   Drug overdose History of Present Illness   KAIREN HALLINAN is a 40 y.o. year-old male with a history of HIV, seizure disorder presenting to the ED with chief complaint of drug overdose.  Patient has been sober for several months but relapsed this evening.  Got drugs from a new source.  Felt immediately like something was different with the IV injection of this new drug.  Per report he was screaming for help and so roommate called EMS.  Patient is now somnolent, altered, unable to fully answer questions.  Endorsing chest pain, no shortness of breath.  I was unable to obtain an accurate HPI, PMH, or ROS due to the patient's altered mental status.  Level 5 caveat.  Review of Systems  Positive for drug use, chest pain.  Patient's Health History    Past Medical History:  Diagnosis Date   Endocarditis    Hepatitis C    History of syphilis 08/25/2014   Treated by [facility] 07-2014.   HIV (human immunodeficiency virus infection) (HCC)    Seizures (HCC)     Past Surgical History:  Procedure Laterality Date   hand surgery Right    LUMBAR PUNCTURE  08/08/2019       WRIST SURGERY      Family History  Adopted: Yes  Family history unknown: Yes    Social History   Socioeconomic History   Marital status: Single    Spouse name: Not on file   Number of children: Not on file   Years of education: Not on file   Highest education level: Not on file  Occupational History   Not on file  Tobacco Use   Smoking status: Every Day    Types: Cigarettes   Smokeless tobacco: Never  Vaping Use   Vaping Use: Never used  Substance and Sexual Activity   Alcohol use: Yes    Comment: 1-2 a week    Drug use: Yes    Frequency: 14.0 times per week    Types: Marijuana, Methamphetamines   Sexual activity: Yes    Partners: Male    Birth control/protection: Condom     Comment: given condoms  Other Topics Concern   Not on file  Social History Narrative   ** Merged History Encounter **       ** Merged History Encounter **       ** Merged History Encounter **       Social Determinants of Corporate investment banker Strain: Not on file  Food Insecurity: Not on file  Transportation Needs: Not on file  Physical Activity: Not on file  Stress: Not on file  Social Connections: Not on file  Intimate Partner Violence: Not on file     Physical Exam   Vitals:   02/17/21 0530 02/17/21 0600  BP: 118/75 119/78  Pulse: 88 87  Resp: 20 (!) 21  Temp:    SpO2: 98% 97%    CONSTITUTIONAL: Well-appearing, NAD NEURO: Somnolent, slow to answer questions, oriented to name place and time, moves all extremities EYES:  eyes equal and reactive ENT/NECK:  no LAD, no JVD CARDIO: Regular rate, well-perfused, normal S1 and S2 PULM:  CTAB no wheezing or rhonchi GI/GU:  normal bowel sounds, non-distended, non-tender MSK/SPINE:  No gross deformities, no edema SKIN:  no rash, atraumatic PSYCH:  Appropriate speech and behavior  *  Additional and/or pertinent findings included in MDM below  Diagnostic and Interventional Summary    EKG Interpretation  Date/Time:  Wednesday February 17 2021 03:57:53 EDT Ventricular Rate:  105 PR Interval:  142 QRS Duration: 100 QT Interval:  362 QTC Calculation: 479 R Axis:   -18 Text Interpretation: Sinus tachycardia Borderline left axis deviation Abnormal R-wave progression, early transition Borderline prolonged QT interval Confirmed by Kennis Carina [phone]) on 02/17/2021 5:30:56 AM       Labs Reviewed  CBC - Abnormal; Notable for the following components:      Result Value   MCV 101.3 (*)    All other components within normal limits  COMPREHENSIVE METABOLIC PANEL - Abnormal; Notable for the following components:   Glucose, Bld 108 (*)    All other components within normal limits  ACETAMINOPHEN LEVEL - Abnormal;  Notable for the following components:   Acetaminophen (Tylenol), Serum <10 (*)    All other components within normal limits  SALICYLATE LEVEL - Abnormal; Notable for the following components:   Salicylate Lvl <7.0 (*)    All other components within normal limits  CBG MONITORING, ED - Abnormal; Notable for the following components:   Glucose-Capillary 158 (*)    All other components within normal limits  ETHANOL  TROPONIN I (HIGH SENSITIVITY)  TROPONIN I (HIGH SENSITIVITY)    DG Chest Port 1 View  Final Result      Medications  sodium chloride 0.9 % bolus 1,000 mL (0 mLs Intravenous Stopped 02/17/21 0618)     Procedures  /  Critical Care Procedures  ED Course and Medical Decision Making  I have reviewed the triage vital signs, the nursing notes, and pertinent available records from the EMR.  Listed above are laboratory and imaging tests that I personally ordered, reviewed, and interpreted and then considered in my medical decision making (see below for details).  Drug overdose, currently patient has more of an opioid toxidrome but it is mild, normal respiratory rate and effort, wakes easily.  Will need close monitoring.  With the chest pain and the report of this severe distress, will need EKG and troponin and will play particular attention to x-ray and mediastinum.     6:30 AM update: Patient remains mildly somnolent, still needs some time to metabolize.  Awaiting second troponin.  Candidate for discharge.  Signed out to oncoming provider at shift change.  Elmer Sow. Pilar Plate, MD [facility] Emergency Medicine [facility] [email or web address]  Final Clinical Impressions(s) / ED Diagnoses     ICD-10-CM   1. Accidental drug overdose, initial encounter  T50.901A       ED Discharge Orders     None        Discharge Instructions Discussed with and Provided to Patient:   Discharge Instructions   None       Sabas Sous, MD 02/17/21 [phone]

## 2021-02-17 NOTE — ED Triage Notes (Signed)
Patient arrives via EMS after using what he believed to be heroin. Pt states he had a bad reaction and believes he may have been given something that was not heroin. EMS reports no pin point pupils, BP and RR WNL. Tachycardic upon EMS arrival, resolved with O2. SPO2 96% on 2 L via [place].  22 g RW
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
## 2021-02-17 NOTE — ED Notes (Signed)
Pt refused to speak with [facility] Assessment counselor x2 Pt refusing to allow the [facility] up to speak with counselor Pt pulling cover over his head in an attempt to "sleep" Pt repeatedly stating, "I'm not talking to nobody. I want to go home."

## 2021-02-17 NOTE — BH Assessment (Signed)
@  0122, requested patient's nurse (Abby, RN) and/or charge nurse Kennyth Arnold, RN) to set up tele assessment machine for patient's initial assessment.

## 2021-02-17 NOTE — ED Provider Notes (Addendum)
  Physical Exam  BP (!) 121/95   Pulse 98   Temp 97.8 F (36.6 C) (Oral)   Resp (!) 23   SpO2 98%   Physical Exam  ED Course/Procedures     Procedures  MDM  Received care of patient from Dr. Orland Dec.  Please see his note for prior history, physical and care.  Briefly this is a 40 year old male with a history of HIV, seizure disorder, who presented with concern for accidental drug overdose.  He reported chest pain, and concerned that he had injected a different drug than he had right expected.  Delta troponins are both negative, have low suspicion for ACS.  He has normal bilateral upper and lower extremity pulses, denies any ongoing chest pain, do not feel history or physical exam are consistent with aortic dissection.  No signs of pneumonia, pneumothorax, pericarditis.  His lab work otherwise does not show any acute abnormalities.  On my evaluation, he reports having suicidal ideation.  When asked if he has a plan, he reports that he does not want to say.  Denies this being intentional OD but that he has made these decisions and now feels he wants to die.   He is medically cleared.  Placed TTS consult.  He is here voluntarily.  TTS consulted and evaluated him and feel he is appropriate for outpatient resources.  Discussed with pt, if he again develops worsening SI he will return for further care.     Alvira Monday, MD 02/19/21 [phone]

## 2021-02-23 ENCOUNTER — Ambulatory Visit: Payer: Self-pay | Admitting: Internal Medicine

## 2021-02-25 ENCOUNTER — Encounter: Payer: Self-pay | Admitting: Internal Medicine

## 2021-03-17 ENCOUNTER — Emergency Department (HOSPITAL_COMMUNITY)
Admission: EM | Admit: 2021-03-17 | Discharge: 2021-03-19 | Disposition: A | Discharge status: Home / Self Care | Payer: Self-pay | Attending: Emergency Medicine | Admitting: Emergency Medicine

## 2021-03-17 ENCOUNTER — Other Ambulatory Visit: Payer: Self-pay

## 2021-03-17 ENCOUNTER — Encounter (HOSPITAL_COMMUNITY): Payer: Self-pay

## 2021-03-17 DIAGNOSIS — R45851 Suicidal ideations: Secondary | ICD-10-CM | POA: Insufficient documentation

## 2021-03-17 DIAGNOSIS — Z21 Asymptomatic human immunodeficiency virus [HIV] infection status: Secondary | ICD-10-CM | POA: Insufficient documentation

## 2021-03-17 DIAGNOSIS — F1999 Other psychoactive substance use, unspecified with unspecified psychoactive substance-induced disorder: Secondary | ICD-10-CM | POA: Diagnosis present

## 2021-03-17 DIAGNOSIS — Z20822 Contact with and (suspected) exposure to covid-19: Secondary | ICD-10-CM | POA: Insufficient documentation

## 2021-03-17 DIAGNOSIS — Y900 Blood alcohol level of less than 20 mg/100 ml: Secondary | ICD-10-CM | POA: Insufficient documentation

## 2021-03-17 DIAGNOSIS — F1910 Other psychoactive substance abuse, uncomplicated: Secondary | ICD-10-CM | POA: Diagnosis present

## 2021-03-17 DIAGNOSIS — F17210 Nicotine dependence, cigarettes, uncomplicated: Secondary | ICD-10-CM | POA: Insufficient documentation

## 2021-03-17 DIAGNOSIS — F1990 Other psychoactive substance use, unspecified, uncomplicated: Secondary | ICD-10-CM | POA: Diagnosis present

## 2021-03-17 DIAGNOSIS — F1290 Cannabis use, unspecified, uncomplicated: Secondary | ICD-10-CM | POA: Insufficient documentation

## 2021-03-17 DIAGNOSIS — F29 Unspecified psychosis not due to a substance or known physiological condition: Secondary | ICD-10-CM | POA: Insufficient documentation

## 2021-03-17 DIAGNOSIS — F1520 Other stimulant dependence, uncomplicated: Secondary | ICD-10-CM | POA: Diagnosis present

## 2021-03-17 DIAGNOSIS — Z8619 Personal history of other infectious and parasitic diseases: Secondary | ICD-10-CM | POA: Diagnosis present

## 2021-03-17 MED ORDER — LORAZEPAM 1 MG PO TABS
1.0000 mg | ORAL_TABLET | ORAL | Status: AC | PRN
  Administered 2021-03-18: 1 mg via ORAL
  Filled 2021-03-17: qty 1

## 2021-03-17 MED ORDER — LEVETIRACETAM 500 MG PO TABS
500.0000 mg | ORAL_TABLET | Freq: Two times a day (BID) | ORAL | Status: DC
  Administered 2021-03-18 – 2021-03-19 (×4): 500 mg via ORAL
  Filled 2021-03-17 (×4): qty 1

## 2021-03-17 MED ORDER — BICTEGRAVIR-EMTRICITAB-TENOFOV 50-200-25 MG PO TABS
1.0000 | ORAL_TABLET | Freq: Every day | ORAL | Status: DC
  Administered 2021-03-18 – 2021-03-19 (×2): 1 via ORAL
  Filled 2021-03-17 (×2): qty 1

## 2021-03-17 MED ORDER — RISPERIDONE 0.5 MG PO TBDP
2.0000 mg | ORAL_TABLET | Freq: Three times a day (TID) | ORAL | Status: DC | PRN
  Administered 2021-03-18 (×3): 2 mg via ORAL
  Filled 2021-03-17 (×3): qty 4

## 2021-03-17 MED ORDER — ZIPRASIDONE MESYLATE 20 MG IM SOLR
20.0000 mg | INTRAMUSCULAR | Status: DC | PRN
Start: 2021-03-17 — End: 2021-03-19

## 2021-03-17 NOTE — ED Notes (Signed)
Pt calm, cooperative. PD at bedside. Pt states that he doesn't know why he's here. Denies being SI/HI.

## 2021-03-17 NOTE — ED Provider Notes (Signed)
[facility]-EMERGENCY DEPT Provider Note   CSN: [id number] Arrival date & time: 03/17/21  2114     History Chief Complaint  Patient presents with   Psychiatric Evaluation   Suicidal    Justin [name] is a 40 y.o. male.  Patient brought in by police IVC paperwork was initiated.  Patient has a history of schizophrenia.  He became aggressive is walking at home with a bat and hammer and stated that he would better off dead.  Patient here denies any of this denies any auditory or visual hallucinations or any suicidal thoughts.  They also noted that patient was talking to people that seem not to exist.  Patient has a history of substance abuse.  History of chronic hepatitis C.  History of HIV.  Patient's vital signs on presentation reassuring temp is 97.4.  Blood pressure 131/90 and heart rate is 78 oxygen saturations on room air is 100%.      Past Medical History:  Diagnosis Date   Endocarditis    Hepatitis C    History of syphilis 08/25/2014   Treated by [facility] 07-2014.   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   Acute psychosis (HCC)    Substance use disorder 08/07/2019   Encephalopathy 08/06/2019   Chronic hepatitis C without hepatic coma (HCC) 08/17/2017   Screening examination for venereal disease 05/03/2017   Encounter for long-term (current) use of high-risk medication 05/03/2017   Anxiety 09/26/2016   Substance abuse (HCC)  09/26/2016   Human immunodeficiency virus (HIV) disease (HCC) 09/02/2014   History of syphilis 08/25/2014    Past Surgical History:  Procedure Laterality Date   hand surgery Right    LUMBAR PUNCTURE  08/08/2019       WRIST SURGERY         Family History  Adopted: Yes  Family history unknown: Yes    Social History   Tobacco Use   Smoking status: Every Day    Types: Cigarettes   Smokeless tobacco: Never  Vaping Use   Vaping Use: Never used  Substance Use Topics   Alcohol use: Yes    Comment: 1-2 a week    Drug use: Yes    Frequency: 14.0 times per week    Types: Marijuana, Methamphetamines    Home Medications Prior to Admission medications   Medication Sig Start Date End Date Taking? Authorizing Provider  bictegravir-emtricitabine-tenofovir AF (BIKTARVY) 50-200-25 MG TABS tablet Take 1 tablet by mouth daily. 07/16/20  Yes Comer, Belia Heman, MD  levETIRAcetam (KEPPRA) 500 MG tablet Take 1 tablet (500 mg total) by mouth 2 (two) times daily. 02/04/21  Yes Vanetta Mulders, MD  Ascorbic Acid (VITAMIN C PO) Take 1 tablet by mouth daily. Patient not taking: Reported on 03/17/2021    [provider]  B Complex Vitamins (VITAMIN B COMPLEX) TABS Take 1 tablet by mouth daily. Patient not taking: Reported on 03/17/2021    [provider]  levETIRAcetam (KEPPRA) 500 MG tablet Take 1 tablet (500 mg  total) by mouth 2 (two) times daily. Patient not taking: No sig reported 01/15/21 03/17/21  Shanon Ace, PA-C  POTASSIUM PO Take 1 tablet by mouth daily. Patient not taking: Reported on 03/17/2021    [provider]    Allergies    Patient has no known allergies.  Review of Systems   Review of Systems  Constitutional:  Negative for chills and fever.  HENT:  Negative for ear pain and sore throat.   Eyes:  Negative for pain and visual disturbance.  Respiratory:  Negative for cough and shortness of breath.   Cardiovascular:  Negative for chest pain and  palpitations.  Gastrointestinal:  Negative for abdominal pain and vomiting.  Genitourinary:  Negative for dysuria and hematuria.  Musculoskeletal:  Negative for arthralgias and back pain.  Skin:  Negative for color change and rash.  Neurological:  Negative for seizures and syncope.  Psychiatric/Behavioral:  Positive for agitation.   All other systems reviewed and are negative.  Physical Exam Updated Vital Signs BP 117/87   Pulse (!) 55   Temp (!) 97.4 F (36.3 C) (Oral)   Resp 16   Ht 1.803 m (5\' 11" )   Wt 90.3 kg   SpO2 98%   BMI 27.77 kg/m   Physical Exam Vitals and nursing note reviewed.  Constitutional:      Appearance: Normal appearance. He is well-developed.  HENT:     Head: Normocephalic and atraumatic.  Eyes:     Extraocular Movements: Extraocular movements intact.     Conjunctiva/sclera: Conjunctivae normal.     Pupils: Pupils are equal, round, and reactive to light.  Cardiovascular:     Rate and Rhythm: Normal rate and regular rhythm.     Heart sounds: No murmur heard. Pulmonary:     Effort: Pulmonary effort is normal. No respiratory distress.     Breath sounds: Normal breath sounds.  Abdominal:     Palpations: Abdomen is soft.     Tenderness: There is no abdominal tenderness.  Musculoskeletal:        General: Normal range of motion.     Cervical back: Neck supple.  Skin:    General: Skin is warm and dry.  Neurological:     General: No focal deficit present.     Mental Status: He is alert.    ED Results / Procedures / Treatments   Labs (all labs ordered are listed, but only abnormal results are displayed) Labs Reviewed  ETHANOL  RAPID URINE DRUG SCREEN, HOSP PERFORMED  COMPREHENSIVE METABOLIC PANEL  CBC WITH DIFFERENTIAL/PLATELET    EKG None  Radiology No results found.  Procedures Procedures   Medications Ordered in ED Medications - No data to display  ED Course  I have reviewed the triage vital signs and the nursing  notes.  Pertinent labs & imaging results that were available during my care of the patient were reviewed by me and considered in my medical decision making (see chart for details).    MDM Rules/Calculators/A&P                           IVC were completed.  Patient will need to be evaluated by behavioral health.  Labs ordered for medical clearance not currently medically cleared.  Past medical history is significant for substance abuse as well as hepatitis C and HIV.  Not clear whether he is taking his antivirals.  But he is prescribed them.  Patient is also on  Keppra supposedly for history of seizures.  Patient known to have a history of schizophrenia.   Final Clinical Impression(s) / ED Diagnoses Final diagnoses:  Psychosis, unspecified psychosis type (HCC)  Suicidal ideation    Rx / DC Orders ED Discharge Orders     None        Vanetta Mulders, MD 03/17/21 2327

## 2021-03-17 NOTE — ED Triage Notes (Signed)
Awake, alert male. Pt brought in by the police for psychiatric eval/SI. Per IVC paperwork, pt ha hx of schizophrenia and today he became aggressive and was walking at home with a bat and hammer and stated that he would be "better off dead." Pt also talking to people who doesn't exist and sees people on the floor.

## 2021-03-17 NOTE — ED Notes (Signed)
Pt. changed into purple scrubs and pt belongings collected into one bag. Pt belongings bag placed in cabinet at nurse's station 9-12.

## 2021-03-18 LAB — COMPREHENSIVE METABOLIC PANEL
ALT: 29 U/L (ref 0–44)
AST: 35 U/L (ref 15–41)
Albumin: 5.4 g/dL — ABNORMAL HIGH (ref 3.5–5.0)
Alkaline Phosphatase: 92 U/L (ref 38–126)
Anion gap: 12 (ref 5–15)
BUN: 14 mg/dL (ref 6–20)
CO2: 26 mmol/L (ref 22–32)
Calcium: 10.6 mg/dL — ABNORMAL HIGH (ref 8.9–10.3)
Chloride: 105 mmol/L (ref 98–111)
Creatinine, Ser: 1.28 mg/dL — ABNORMAL HIGH (ref 0.61–1.24)
GFR, Estimated: >60 mL/min (ref >60)
Glucose, Bld: 86 mg/dL (ref 70–99)
Potassium: 3.7 mmol/L (ref 3.5–5.1)
Sodium: 143 mmol/L (ref 135–145)
Total Bilirubin: 2.9 mg/dL — ABNORMAL HIGH (ref 0.3–1.2)
Total Protein: 9.3 g/dL — ABNORMAL HIGH (ref 6.5–8.1)

## 2021-03-18 LAB — RESP PANEL BY RT-PCR (FLU A&B, COVID) ARPGX2
Influenza A by PCR: NEGATIVE
Influenza B by PCR: NEGATIVE
SARS Coronavirus 2 by RT PCR: NEGATIVE

## 2021-03-18 LAB — CBC WITH DIFFERENTIAL/PLATELET
Abs Immature Granulocytes: 0.01 10*3/uL (ref 0.00–0.07)
Basophils Absolute: 0 10*3/uL (ref 0.0–0.1)
Basophils Relative: 1 %
Eosinophils Absolute: 0.1 10*3/uL (ref 0.0–0.5)
Eosinophils Relative: 1 %
HCT: 55 % — ABNORMAL HIGH (ref 39.0–52.0)
Hemoglobin: 18.3 g/dL — ABNORMAL HIGH (ref 13.0–17.0)
Immature Granulocytes: 0 %
Lymphocytes Relative: 44 %
Lymphs Abs: 2.7 10*3/uL (ref 0.7–4.0)
MCH: 32.6 pg (ref 26.0–34.0)
MCHC: 33.3 g/dL (ref 30.0–36.0)
MCV: 98 fL (ref 80.0–100.0)
Monocytes Absolute: 0.6 10*3/uL (ref 0.1–1.0)
Monocytes Relative: 10 %
Neutro Abs: 2.7 10*3/uL (ref 1.7–7.7)
Neutrophils Relative %: 44 %
Platelets: 218 10*3/uL (ref 150–400)
RBC: 5.61 MIL/uL (ref 4.22–5.81)
RDW: 12.2 % (ref 11.5–15.5)
WBC: 6.1 10*3/uL (ref 4.0–10.5)
nRBC: 0 % (ref 0.0–0.2)

## 2021-03-18 LAB — RAPID URINE DRUG SCREEN, HOSP PERFORMED
Amphetamines: POSITIVE — AB
Barbiturates: NOT DETECTED
Benzodiazepines: NOT DETECTED
Cocaine: NOT DETECTED
Opiates: NOT DETECTED
Tetrahydrocannabinol: POSITIVE — AB

## 2021-03-18 LAB — ETHANOL: Alcohol, Ethyl (B): <10 mg/dL (ref <10)

## 2021-03-18 MED ORDER — IBUPROFEN 800 MG PO TABS
800.0000 mg | ORAL_TABLET | Freq: Once | ORAL | Status: AC
  Administered 2021-03-18: 800 mg via ORAL
  Filled 2021-03-18: qty 1

## 2021-03-18 MED ORDER — MELATONIN 5 MG PO TABS
5.0000 mg | ORAL_TABLET | Freq: Every evening | ORAL | Status: DC | PRN
Start: 2021-03-18 — End: 2021-03-19
  Administered 2021-03-18: 5 mg via ORAL
  Filled 2021-03-18: qty 1

## 2021-03-18 NOTE — ED Notes (Signed)
Pt got agitated bc he was asked to get off the computer in his room. Security had to be called to help calm pt down.

## 2021-03-18 NOTE — ED Notes (Signed)
Pt belongings in locker. 

## 2021-03-18 NOTE — ED Notes (Signed)
Patients 1 belonging bag moved to [employer] 28. Report given to Alvino Chapel, RN.

## 2021-03-18 NOTE — BH Assessment (Signed)
TTS clinician attempted assessment. Per Albin Felling, RN, patient sedated and unable to participate. TTS will attempt at later time.

## 2021-03-18 NOTE — ED Provider Notes (Signed)
Patient care assumed at 2300.  Pt here under IVC for aggressive and threatening behavior.  Labs pending.   Labs without significant electrolyte abnormality. Renal function at its baseline. Patient has been medically cleared for psychiatric evaluation and treatment.   Tilden Fossa, MD 03/18/21 [phone]

## 2021-03-18 NOTE — ED Notes (Signed)
Pt given meds and food to eat and pt apologized for acting out earlier. Blood drawn and covid swab set to lab. Pt aware that still awaiting urine sample.

## 2021-03-18 NOTE — ED Notes (Signed)
At bedside after hearing pts voice raised. Pt logged off computer when I asked him to and was calm with this Clinical research associate. He allowed this Clinical research associate to obtain lab work. He asked for food and drink which I provided and he apologized to the RN. He took his meds and allowed covid swab to be obtained.

## 2021-03-19 ENCOUNTER — Emergency Department (HOSPITAL_COMMUNITY)
Admission: EM | Admit: 2021-03-19 | Discharge: 2021-03-19 | Disposition: A | Discharge status: Home / Self Care | Payer: Self-pay | Attending: Emergency Medicine | Admitting: Emergency Medicine

## 2021-03-19 DIAGNOSIS — F17210 Nicotine dependence, cigarettes, uncomplicated: Secondary | ICD-10-CM | POA: Insufficient documentation

## 2021-03-19 DIAGNOSIS — Z21 Asymptomatic human immunodeficiency virus [HIV] infection status: Secondary | ICD-10-CM | POA: Insufficient documentation

## 2021-03-19 DIAGNOSIS — W228XXA Striking against or struck by other objects, initial encounter: Secondary | ICD-10-CM | POA: Insufficient documentation

## 2021-03-19 DIAGNOSIS — Y92009 Unspecified place in unspecified non-institutional (private) residence as the place of occurrence of the external cause: Secondary | ICD-10-CM | POA: Insufficient documentation

## 2021-03-19 DIAGNOSIS — S61412A Laceration without foreign body of left hand, initial encounter: Secondary | ICD-10-CM

## 2021-03-19 LAB — RPR: RPR Ser Ql: REACTIVE — AB

## 2021-03-19 MED ORDER — OXYCODONE-ACETAMINOPHEN 5-325 MG PO TABS
1.0000 | ORAL_TABLET | Freq: Once | ORAL | Status: AC
  Administered 2021-03-19: 1 via ORAL
  Filled 2021-03-19: qty 1

## 2021-03-19 MED ORDER — LIDOCAINE-EPINEPHRINE (PF) 2 %-1:200000 IJ SOLN
20.0000 mL | Freq: Once | INTRAMUSCULAR | Status: AC
  Administered 2021-03-19: 20 mL
  Filled 2021-03-19: qty 20

## 2021-03-19 MED ORDER — PENICILLIN G BENZATHINE 1200000 UNIT/2ML IM SUSY
2.4000 10*6.[IU] | PREFILLED_SYRINGE | Freq: Once | INTRAMUSCULAR | Status: AC
  Administered 2021-03-19: 2.4 10*6.[IU] via INTRAMUSCULAR
  Filled 2021-03-19: qty 4

## 2021-03-19 MED ORDER — TETANUS-DIPHTH-ACELL PERTUSSIS 5-2.5-18.5 LF-MCG/0.5 IM SUSY: 0.5000 mL | PREFILLED_SYRINGE | Freq: Once | INTRAMUSCULAR | Status: DC

## 2021-03-19 NOTE — BH Assessment (Signed)
Clinician messaged Elijio Miles, RN to see if the pt is able to be assessed. Per RN the pt is sleeping. Clinician asked the RN to let her know when the pt is alert and able to engage in assessment.    Redmond Pulling, MS, [facility], [facility] Triage Specialist [phone]

## 2021-03-19 NOTE — Discharge Instructions (Signed)
For your behavioral health needs you are advised to follow up with [facility] at your earliest opportunity:      [facility]      [address]      [place], [place] [address]      [phone]      They also offer psychiatry/medication management, therapy and substance abuse treatment.  New patients are seen in their walk-in clinic.  Walk-in hours are Monday - Thursday from 8:00 am - 11:00 am for psychiatry, and Friday from 1:00 pm - 4:00 pm for therapy.  Walk-in patients are seen on a first come, first served basis, so try to arrive as early as possible for the best chance of being seen the same day.  Please note that to be eligible for services you must bring an ID or a piece of mail with your name and a [facility] address.

## 2021-03-19 NOTE — ED Triage Notes (Signed)
Pt states his left hand got stuck in a door at home while horseplaying. Pt has a laceration below left thumb.

## 2021-03-19 NOTE — ED Notes (Signed)
Pt resting at this time. Due to prior behaviors from pt staff will get vitals when pt awakes. Respirations even and unlabored

## 2021-03-19 NOTE — BH Assessment (Signed)
[facility] Assessment Progress Note   Per Maxie Barb, NP, this pt does not require psychiatric hospitalization at this time.  Pt presents under IVC initiated by a friend and upheld by EDP Vanetta Mulders, MD, which has been rescinded by Nelly Rout, MD.  Pt is psychiatrically cleared.  Discharge instructions include referral to [facility].  EDP Linwood Dibbles, MD and pt's nurse, Sherrilyn Rist, have been notified.  Doylene Canning, MA Triage Specialist [phone]

## 2021-03-19 NOTE — Discharge Instructions (Addendum)
Sutured repair Keep the laceration site dry for the next 24 hours and leave the dressing in place. After 24 hours you may remove the dressing and gently clean the laceration site with antibacterial soap and warm water. Do not scrub the area. Do not soak the area and water for long periods of time. Don't use hydrogen peroxide, iodine-based solutions, or alcohol, which can slow healing, and will probably be painful!  Return to the emergency department in 7-10 days for removal of the sutures.  You should return sooner for any signs of infection which would include increased redness around the wound, increased swelling, new drainage of yellow pus.     
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
## 2021-03-19 NOTE — ED Provider Notes (Signed)
Emergency Medicine Observation Re-evaluation Note  Justin [name] is a 40 y.o. male, seen on rounds today.  Pt initially presented to the ED for complaints of Psychiatric Evaluation and Suicidal   Physical Exam  BP 98/72 (BP Location: Right Arm)   Pulse 73   Temp 97.6 F (36.4 C) (Oral)   Resp 18   Ht 1.803 m (5\' 11" )   Wt 90.3 kg   SpO2 98%   BMI 27.77 kg/m  Physical Exam General: No acute distress   ED Course / MDM  EKG:   I have reviewed the labs performed to date as well as medications administered while in observation.  Recent changes in the last 24 hours include reassessment by psychiatry.    Plan  Current plan is for discharge, patient has been cleared by psychiatry.  Justin [name] is under involuntary commitment.      , MD 03/19/21 [phone]

## 2021-03-19 NOTE — ED Provider Notes (Signed)
[facility]-EMERGENCY DEPT Provider Note   CSN: [id number] Arrival date & time: 03/19/21  1434     History Chief Complaint  Patient presents with   Laceration    Justin [name] is a 40 y.o. male.  He has a past medical history of HIV, substance use disorder, Hepatitis C, depression, schizophrenia.  Patient presents today with a laceration to his left hand.  He states that he was playing with his little buddy when he got his hand slammed in on a door.  He has a laceration to his left dorsum of his hand on the thumb side.  He denies any numbness or tingling to distal extremity.  He denies any range of motion limitations to his thumb. He was recently discharged from this facility for possible suicidal ideations.  His IVC at that time but was discharged home at 1129 this morning.  At the time he was medically cleared and will need to be evaluated by behavioral health on outpatient needs.   Laceration Associated symptoms: no fever       Past Medical History:  Diagnosis Date   Endocarditis    Hepatitis C    History of syphilis 08/25/2014   Treated by [facility] 07-2014.   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   Acute psychosis (HCC)    Substance use disorder 08/07/2019   Encephalopathy 08/06/2019   Chronic hepatitis C without hepatic coma (HCC) 08/17/2017   Screening examination for venereal disease 05/03/2017   Encounter for long-term  (current) use of high-risk medication 05/03/2017   Anxiety 09/26/2016   Substance abuse (HCC) 09/26/2016   Human immunodeficiency virus (HIV) disease (HCC) 09/02/2014   History of syphilis 08/25/2014    Past Surgical History:  Procedure Laterality Date   hand surgery Right    LUMBAR PUNCTURE  08/08/2019       WRIST SURGERY         Family History  Adopted: Yes  Family history unknown: Yes    Social History   Tobacco Use   Smoking status: Every Day    Types: Cigarettes   Smokeless tobacco: Never  Vaping Use   Vaping Use: Never used  Substance Use Topics   Alcohol use: Yes    Comment: 1-2 a week    Drug use: Yes    Frequency: 14.0 times per week    Types: Marijuana, Methamphetamines    Home Medications Prior to Admission medications   Medication Sig Start Date End Date Taking? Authorizing Provider  Ascorbic Acid (VITAMIN C PO) Take 1 tablet by mouth daily. Patient not taking: Reported on 03/17/2021    [provider]  B Complex Vitamins (VITAMIN B COMPLEX) TABS Take 1 tablet by mouth daily. Patient not taking: Reported on 03/17/2021    [provider]  bictegravir-emtricitabine-tenofovir AF (BIKTARVY) 50-200-25 MG TABS tablet Take 1 tablet by mouth daily. 07/16/20   [name], MD  levETIRAcetam (KEPPRA) 500 MG tablet Take 1 tablet (500 mg total) by mouth 2 (  two) times daily. Patient not taking: No sig reported 01/15/21 03/17/21  Shanon Ace, PA-C  levETIRAcetam (KEPPRA) 500 MG tablet Take 1 tablet (500 mg total) by mouth 2 (two) times daily. 02/04/21   Vanetta Mulders, MD  POTASSIUM PO Take 1 tablet by mouth daily. Patient not taking: Reported on 03/17/2021    [provider]    Allergies    Patient has no known allergies.  Review of Systems   Review of Systems  Constitutional:  Negative for chills and fever.  Musculoskeletal:  Negative for arthralgias.  Skin:  Positive for wound.  Neurological:  Negative for weakness and  numbness.  Psychiatric/Behavioral:  Negative for agitation, behavioral problems, self-injury and suicidal ideas. The patient is not nervous/anxious and is not hyperactive.   All other systems reviewed and are negative.  Physical Exam Updated Vital Signs BP (!) 121/104   Pulse 83   Temp 98.7 F (37.1 C)   Resp 17   SpO2 99%   Physical Exam Vitals and nursing note reviewed.  Constitutional:      General: He is not in acute distress.    Appearance: Normal appearance. He is not ill-appearing, toxic-appearing or diaphoretic.  HENT:     Head: Normocephalic and atraumatic.  Eyes:     General: No scleral icterus.       Right eye: No discharge.        Left eye: No discharge.     Conjunctiva/sclera: Conjunctivae normal.  Pulmonary:     Effort: Pulmonary effort is normal. No respiratory distress.  Skin:    General: Skin is warm and dry.     Comments: Small 0.5 cm laceration to the left dorsum of his hand on the thumb side.  It is not very deep.  I can visualize the entire laceration depth.  No evidence of foreign body.  Radial pulses intact.  Capillary refill distal to the site is normal.  Sensation intact distal to the site.  Range of motion of thumb intact.  Neurological:     General: No focal deficit present.     Mental Status: He is alert and oriented to person, place, and time.  Psychiatric:        Mood and Affect: Mood normal.        Behavior: Behavior normal.    ED Results / Procedures / Treatments   Labs (all labs ordered are listed, but only abnormal results are displayed) Labs Reviewed - No data to display  EKG None  Radiology No results found.  Procedures .Marland KitchenLaceration Repair  Date/Time: 03/20/2021 12:07 AM Performed by: Claudie Leach, PA-C Authorized by: Claudie Leach, PA-C   Consent:    Consent obtained:  Verbal   Consent given by:  Patient   Risks, benefits, and alternatives were discussed: yes     Risks discussed:  Infection, pain and poor  cosmetic result Universal protocol:    Patient identity confirmed:  Verbally with patient Anesthesia:    Anesthesia method:  Local infiltration   Local anesthetic:  Lidocaine 1% WITH epi Laceration details:    Location:  Hand   Hand location:  L hand, dorsum   Length (cm):  0.5   Depth (mm):  2 Exploration:    Wound exploration: entire depth of wound visualized     Wound extent: no areolar tissue violation noted, no fascia violation noted, no foreign bodies/material noted, no muscle damage noted, no nerve damage noted, no tendon damage noted, no underlying fracture noted and  no vascular damage noted     Contaminated: no   Treatment:    Area cleansed with:  Saline   Amount of cleaning:  Standard   Irrigation solution:  Sterile saline   Irrigation method:  Syringe Skin repair:    Repair method:  Sutures   Suture size:  5-0   Suture material:  Prolene   Suture technique:  Simple interrupted   Number of sutures:  3 Approximation:    Approximation:  Close Repair type:    Repair type:  Simple Post-procedure details:    Dressing:  Adhesive bandage   Procedure completion:  Tolerated well, no immediate complications   Medications Ordered in ED Medications  lidocaine-EPINEPHrine (XYLOCAINE W/EPI) 2 %-1:200000 (PF) injection 20 mL (20 mLs Infiltration Given by Other 03/19/21 1614)  oxyCODONE-acetaminophen (PERCOCET/ROXICET) 5-325 MG per tablet 1 tablet (1 tablet Oral Given 03/19/21 1614)  penicillin g benzathine (BICILLIN LA) 1200000 UNIT/2ML injection 2.4 Million Units (2.4 Million Units Intramuscular Given 03/19/21 1713)    ED Course  I have reviewed the triage vital signs and the nursing notes.  Pertinent labs & imaging results that were available during my care of the patient were reviewed by me and considered in my medical decision making (see chart for details).    MDM Rules/Calculators/A&P                          Well-appearing 40 year old male presents after having a  laceration to his left hand.  His vitals are stable.  He is afebrile.  I am able to visualize entire laceration depth with no evidence of foreign body.  Range of motion is intact.  Neurovascularly intact.  Should be a simple laceration.  Will suture laceration.  Procedure will be detailed above.  Patient declined Tdap.  Laceration closed with no acute complications. See procedure information for more information.  Of note, while patient was here in the ED.  His RPR from original admission earlier this morning popped back positive.  It appears that he has been reactive since 2021 and has not been treated.  We will give penicillin IM prior to discharge.   Clinical Clinical Impression(s) / ED Diagnoses Final diagnoses:  Laceration of left hand without foreign body, initial encounter    Rx / DC Orders ED Discharge Orders     None        Claudie Leach, PA-C 03/20/21 0018    Horton, Clabe Seal, DO 03/20/21 1532

## 2021-03-19 NOTE — ED Notes (Signed)
Spoke to safe transport to arrange for d/c.  Per operator transportation should arrive in the next 10 minutes.

## 2021-03-19 NOTE — ED Notes (Signed)
Unable to obtain e-sign d/t equipment malfunction.  D/c instructions reviewed w/ pt including findings, follow up and return precautions.  Pt verbalized understanding of the above.  Pt escorted out to safe transport.

## 2021-03-20 DIAGNOSIS — F1999 Other psychoactive substance use, unspecified with unspecified psychoactive substance-induced disorder: Secondary | ICD-10-CM | POA: Diagnosis present

## 2021-03-22 ENCOUNTER — Emergency Department (HOSPITAL_COMMUNITY)
Admission: EM | Admit: 2021-03-22 | Discharge: 2021-03-22 | Disposition: A | Discharge status: Home / Self Care | Payer: Self-pay | Attending: Emergency Medicine | Admitting: Emergency Medicine

## 2021-03-22 ENCOUNTER — Encounter (HOSPITAL_COMMUNITY): Payer: Self-pay | Admitting: *Deleted

## 2021-03-22 DIAGNOSIS — Z4802 Encounter for removal of sutures: Secondary | ICD-10-CM | POA: Insufficient documentation

## 2021-03-22 DIAGNOSIS — M79643 Pain in unspecified hand: Secondary | ICD-10-CM | POA: Insufficient documentation

## 2021-03-22 DIAGNOSIS — Z5321 Procedure and treatment not carried out due to patient leaving prior to being seen by health care provider: Secondary | ICD-10-CM | POA: Insufficient documentation

## 2021-03-22 NOTE — ED Triage Notes (Signed)
Pt had laceration 3 days ago, reports being told to wait 3 days to have suture removed. Informed pt the instructions were for 7-10 days. He would like to be seen for hand pain around suture site.

## 2021-03-27 ENCOUNTER — Encounter (HOSPITAL_COMMUNITY): Payer: Self-pay

## 2021-03-27 ENCOUNTER — Other Ambulatory Visit: Payer: Self-pay | Admitting: Internal Medicine

## 2021-03-27 ENCOUNTER — Inpatient Hospital Stay (HOSPITAL_COMMUNITY)
Admission: EM | Admit: 2021-03-27 | Discharge: 2021-03-28 | DRG: 101 | Disposition: A | Discharge status: Home / Self Care | Payer: Self-pay | Attending: Family Medicine | Admitting: Family Medicine

## 2021-03-27 ENCOUNTER — Emergency Department (HOSPITAL_COMMUNITY): Payer: Self-pay

## 2021-03-27 ENCOUNTER — Other Ambulatory Visit: Payer: Self-pay

## 2021-03-27 DIAGNOSIS — G40409 Other generalized epilepsy and epileptic syndromes, not intractable, without status epilepticus: Principal | ICD-10-CM | POA: Diagnosis present

## 2021-03-27 DIAGNOSIS — B20 Human immunodeficiency virus [HIV] disease: Secondary | ICD-10-CM | POA: Diagnosis present

## 2021-03-27 DIAGNOSIS — Z79899 Other long term (current) drug therapy: Secondary | ICD-10-CM

## 2021-03-27 DIAGNOSIS — B1920 Unspecified viral hepatitis C without hepatic coma: Secondary | ICD-10-CM | POA: Diagnosis present

## 2021-03-27 DIAGNOSIS — F17210 Nicotine dependence, cigarettes, uncomplicated: Secondary | ICD-10-CM | POA: Diagnosis present

## 2021-03-27 DIAGNOSIS — F1910 Other psychoactive substance abuse, uncomplicated: Secondary | ICD-10-CM | POA: Diagnosis present

## 2021-03-27 DIAGNOSIS — R569 Unspecified convulsions: Secondary | ICD-10-CM

## 2021-03-27 DIAGNOSIS — Z20822 Contact with and (suspected) exposure to covid-19: Secondary | ICD-10-CM | POA: Diagnosis present

## 2021-03-27 LAB — COMPREHENSIVE METABOLIC PANEL
ALT: 25 U/L (ref 0–44)
AST: 41 U/L (ref 15–41)
Albumin: 4 g/dL (ref 3.5–5.0)
Alkaline Phosphatase: 81 U/L (ref 38–126)
Anion gap: 9 (ref 5–15)
BUN: 17 mg/dL (ref 6–20)
CO2: 23 mmol/L (ref 22–32)
Calcium: 9.3 mg/dL (ref 8.9–10.3)
Chloride: 103 mmol/L (ref 98–111)
Creatinine, Ser: 1.09 mg/dL (ref 0.61–1.24)
GFR, Estimated: >60 mL/min (ref >60)
Glucose, Bld: 94 mg/dL (ref 70–99)
Potassium: 4.9 mmol/L (ref 3.5–5.1)
Sodium: 135 mmol/L (ref 135–145)
Total Bilirubin: 1.1 mg/dL (ref 0.3–1.2)
Total Protein: 7 g/dL (ref 6.5–8.1)

## 2021-03-27 LAB — MAGNESIUM: Magnesium: 2.3 mg/dL (ref 1.7–2.4)

## 2021-03-27 LAB — ETHANOL: Alcohol, Ethyl (B): <10 mg/dL (ref <10)

## 2021-03-27 MED ORDER — LORAZEPAM 2 MG/ML IJ SOLN
INTRAMUSCULAR | Status: AC
  Administered 2021-03-27: 2 mg via INTRAVENOUS
  Filled 2021-03-27: qty 1

## 2021-03-27 MED ORDER — LACTATED RINGERS IV BOLUS
1000.0000 mL | Freq: Once | INTRAVENOUS | Status: AC
  Administered 2021-03-27: 1000 mL via INTRAVENOUS

## 2021-03-27 MED ORDER — LORAZEPAM 2 MG/ML IJ SOLN: 2.0000 mg | Freq: Once | INTRAMUSCULAR | Status: AC

## 2021-03-27 MED ORDER — LACOSAMIDE 50 MG PO TABS
50.0000 mg | ORAL_TABLET | Freq: Two times a day (BID) | ORAL | Status: DC
  Administered 2021-03-28: 50 mg via ORAL
  Filled 2021-03-27: qty 1

## 2021-03-27 MED ORDER — LEVETIRACETAM IN NACL 500 MG/100ML IV SOLN
500.0000 mg | Freq: Once | INTRAVENOUS | Status: AC
  Administered 2021-03-27: 500 mg via INTRAVENOUS
  Filled 2021-03-27: qty 100

## 2021-03-27 MED ORDER — SODIUM CHLORIDE 0.9 % IV SOLN
200.0000 mg | Freq: Once | INTRAVENOUS | Status: AC
  Administered 2021-03-28: 200 mg via INTRAVENOUS
  Filled 2021-03-27: qty 20

## 2021-03-27 MED ORDER — LORAZEPAM 2 MG/ML IJ SOLN
2.0000 mg | Freq: Once | INTRAMUSCULAR | Status: AC
  Filled 2021-03-27: qty 1

## 2021-03-27 NOTE — ED Provider Notes (Signed)
[facility]-EMERGENCY DEPT Provider Note   CSN: [id number] Arrival date & time: 03/27/21  1730     History Chief Complaint  Patient presents with   Seizures    Justin [name] is a 40 y.o. male.  HPI Patient is a 40 year old male with history of HIV, seizures, substance abuse, hepatitis C, who presents to the emergency department due to seizure-like activity.  Per EMS notes, patient's roommate heard a noise in his room and when entering the room witnessed a tonic-clonic episode that lasted "several minutes".  Patient was initially postictal on EMS arrival.  Patient has no recollection of these events.  He reports currently feeling fatigued and also notes pain to the left lateral tongue.  Denies any numbness, weakness, visual changes, chest pain, shortness of breath.  States that he does not drink alcohol on a daily basis and drank about 6-7 alcoholic beverages last night.  Also reports using intranasal heroin 2 nights ago.  States he has been compliant with his Keppra.    Past Medical History:  Diagnosis Date   Endocarditis    Hepatitis C    History of syphilis 08/25/2014   Treated by [facility] 07-2014.   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   Acute psychosis (HCC)    Substance use disorder 08/07/2019   Encephalopathy 08/06/2019   Chronic hepatitis C without hepatic coma (HCC) 08/17/2017    Screening examination for venereal disease 05/03/2017   Encounter for long-term (current) use of high-risk medication 05/03/2017   Anxiety 09/26/2016   Substance abuse (HCC) 09/26/2016   Human immunodeficiency virus (HIV) disease (HCC) 09/02/2014   History of syphilis 08/25/2014    Past Surgical History:  Procedure Laterality Date   hand surgery Right    LUMBAR PUNCTURE  08/08/2019       WRIST SURGERY         Family History  Adopted: Yes  Family history unknown: Yes    Social History   Tobacco Use   Smoking status: Every Day    Types: Cigarettes   Smokeless tobacco: Never  Vaping Use   Vaping Use: Never used  Substance Use Topics   Alcohol use: Yes    Comment: 1-2 a week    Drug use: Yes    Frequency: 14.0 times per week    Types: Marijuana, Methamphetamines    Home Medications Prior to Admission medications   Medication Sig Start Date End Date Taking? Authorizing Provider  Ascorbic Acid (VITAMIN C PO) Take 1 tablet by mouth daily. Patient not taking: Reported on 03/17/2021    [provider]  B Complex Vitamins (VITAMIN B COMPLEX) TABS Take 1 tablet by mouth daily. Patient not taking: Reported on 03/17/2021    [provider]  bictegravir-emtricitabine-tenofovir AF (BIKTARVY) 50-200-25 MG TABS tablet Take 1 tablet by mouth daily. 07/16/20   [name], MD  levETIRAcetam (KEPPRA) 500 MG tablet Take 1 tablet (500 mg total) by mouth 2 (two) times  daily. Patient not taking: No sig reported 01/15/21 03/17/21  Shanon Ace, PA-C  levETIRAcetam (KEPPRA) 500 MG tablet Take 1 tablet (500 mg total) by mouth 2 (two) times daily. 02/04/21   Vanetta Mulders, MD  POTASSIUM PO Take 1 tablet by mouth daily. Patient not taking: Reported on 03/17/2021    [provider]    Allergies    Patient has no known allergies.  Review of Systems   Review of Systems  All other systems reviewed and are negative. Ten systems reviewed and are negative for  acute change, except as noted in the HPI.   Physical Exam Updated Vital Signs BP 120/86   Pulse 91   Temp (!) 97.1 F (36.2 C) (Oral)   Resp 18   Ht 6' (1.829 m)   Wt 90.7 kg   SpO2 100%   BMI 27.12 kg/m   Physical Exam Vitals and nursing note reviewed.  Constitutional:      General: He is not in acute distress.    Appearance: Normal appearance. He is not ill-appearing, toxic-appearing or diaphoretic.  HENT:     Head: Normocephalic and atraumatic.     Right Ear: External ear normal.     Left Ear: External ear normal.     Nose: Nose normal.     Mouth/Throat:     Mouth: Mucous membranes are moist.     Pharynx: Oropharynx is clear. No oropharyngeal exudate or posterior oropharyngeal erythema.     Comments: Tongue bites noted to the left lateral tongue.  No active bleeding. Eyes:     General: No scleral icterus.       Right eye: No discharge.        Left eye: No discharge.     Extraocular Movements: Extraocular movements intact.     Conjunctiva/sclera: Conjunctivae normal.     Pupils: Pupils are equal, round, and reactive to light.  Cardiovascular:     Rate and Rhythm: Normal rate and regular rhythm.     Pulses: Normal pulses.     Heart sounds: Normal heart sounds. No murmur heard.   No friction rub. No gallop.  Pulmonary:     Effort: Pulmonary effort is normal. No respiratory distress.     Breath sounds: Normal breath sounds. No stridor. No wheezing, rhonchi or rales.  Abdominal:     General: Abdomen is flat.     Tenderness: There is no abdominal tenderness.  Musculoskeletal:        General: Normal range of motion.     Cervical back: Normal range of motion and neck supple. No tenderness.  Skin:    General: Skin is warm and dry.  Neurological:     General: No focal deficit present.     Mental Status: He is alert and oriented to person, place, and time.     Comments: A&O x3.  Speaking clearly, coherently, and in complete sentences.  Moving all 4 extremities with ease.   No gross deficits.  Psychiatric:        Mood and Affect: Mood normal.        Behavior: Behavior normal.   ED Results / Procedures / Treatments   Labs (all labs ordered are listed, but only abnormal results are displayed) Labs Reviewed  COMPREHENSIVE METABOLIC PANEL  ETHANOL  MAGNESIUM  CBC WITH DIFFERENTIAL/PLATELET  RAPID URINE DRUG SCREEN, HOSP PERFORMED  CBC WITH DIFFERENTIAL/PLATELET  CBG MONITORING, ED   EKG None  Radiology CT HEAD WO CONTRAST ( )  Result Date: 03/27/2021  CLINICAL DATA:  Seizure, nontraumatic (Age 53-40y) EXAM: CT HEAD WITHOUT CONTRAST TECHNIQUE: Contiguous axial images were obtained from the base of the skull through the vertex without intravenous contrast. COMPARISON:  June 25, 2020 FINDINGS: Brain: No evidence of acute infarction, hemorrhage, hydrocephalus, extra-axial collection or mass lesion/mass effect. Vascular: No hyperdense vessel or unexpected calcification. Skull: Normal. Negative for fracture or focal lesion. Sinuses/Orbits: No acute finding. Other: None. IMPRESSION: No acute intracranial abnormality. Electronically Signed   By: Meda Klinefelter M.D.   On: 03/27/2021 18:26    Procedures .Critical Care Performed by: Placido Sou, PA-C Authorized by: Placido Sou, PA-C   Critical care provider statement:    Critical care time (minutes):  60   Critical care was necessary to treat or prevent imminent or life-threatening deterioration of the following conditions: seizure.   Critical care was time spent personally by me on the following activities:  Discussions with consultants, evaluation of patient's response to treatment, examination of patient, ordering and performing treatments and interventions, ordering and review of laboratory studies, ordering and review of radiographic studies, pulse oximetry, re-evaluation of patient's condition, obtaining history from patient or surrogate and review of old charts   Medications Ordered in  ED Medications  lactated ringers bolus 1,000 mL (0 mLs Intravenous Stopped 03/27/21 2018)  LORazepam (ATIVAN) injection 2 mg (2 mg Intravenous Given 03/27/21 2016)  levETIRAcetam (KEPPRA) IVPB 500 mg/100 mL premix (0 mg Intravenous Stopped 03/27/21 2037)  LORazepam (ATIVAN) injection 2 mg (2 mg Intravenous Given 03/27/21 2130)   ED Course  I have reviewed the triage vital signs and the nursing notes.  Pertinent labs & imaging results that were available during my care of the patient were reviewed by me and considered in my medical decision making (see chart for details).  Clinical Course as of 03/27/21 2315  Sat Mar 27, 2021  2009 Nursing staff found patient postictal.  Blood coming from the mouth.  Patient groans in response to sternal rub.  Oxygen saturation at 97%.  We will give IV Ativan.  We will also start on Keppra. [LJ]  2125 Nursing staff found to be seizing once again.  Patient desaturated briefly into the low 70s.  Saturations have improved into the 90s on room air.  Patient given an additional 2 mg of Ativan.  Will admit to the medicine team for further management. [LJ]  2135 Spoke to neurology.  He agrees with medicine admission at [facility].  Recommends that patient have his Keppra increased to 1000 mg twice daily given his substance use as well as his body weight.  [LJ]    Clinical Course User Index [LJ] Placido Sou, PA-C   MDM Rules/Calculators/A&P                          Pt is a 40 y.o. male who presents to the emergency department due to multiple witnessed seizures both prior to arrival as well as since arriving to the emergency department.  Labs: CBC and UDS pending. Ethanol less than 10. CMP within normal limits. Magnesium of 2.3.  Imaging: CT scan of the head without contrast is negative.  I, Placido Sou, PA-C, personally reviewed and evaluated these images and lab results as part of my medical decision-making.  Patient with a history of seizure  disorder and states that he has been compliant with his Keppra.  Patient states he does not typically drink alcohol and went out drinking with friends last night.  Also notes using intranasal heroin 2 nights ago.  Patient had a witnessed tonic-clonic episode by his roommate prior to arrival.  He was initially postictal with EMS but was back to baseline during my initial evaluation.  Since coming to the emergency department he has had 2 additional witnessed tonic-clonic episodes.  He was given a loading dose of IV Keppra as well as 2 mg of IV Ativan during both witnessed seizures.  Given patient's recurrent seizures in the ED feel that admission to the medicine team is warranted.  He was discussed with neurology who was in agreement.  They recommend the patient's dose of Keppra be increased to 1000 mg twice per day.  Will discuss with the medicine team.  Note: Portions of this report may have been transcribed using voice recognition software. Every effort was made to ensure accuracy; however, inadvertent computerized transcription errors may be present.   Final Clinical Impression(s) / ED Diagnoses Final diagnoses:  Seizure [facility])    Rx / DC Orders ED Discharge Orders     None        Placido Sou, PA-C 03/27/21 2317    Gloris Manchester, MD 03/29/21 [phone]

## 2021-03-27 NOTE — ED Notes (Signed)
Pt. CBG 293, RN, Huntley Dec made aware.

## 2021-03-27 NOTE — ED Notes (Signed)
Walked in to say hello to pt and found pt to be laying halfway off the bed with head turned to the right. Blood was noted in pts mouth and bed sheet under his head. IV was pulled out of arm and located on chest.  Pt snoring with room air oxygen at 76% and heart rate was 133. Pt not responding to verbal stimuli. RN and MD called to bedside.

## 2021-03-27 NOTE — ED Triage Notes (Signed)
Pt BIB EMS. Pt roommate stated he heard something from pts room, went in room, pt was having a grand mal seizure for several mins. Per EMS pt was postictal on arrival. Pt has hx of seizure and does take his mes. Pt stated he was at the bar last night. Per EMS trauma to the left side of tongue, blood controlled.

## 2021-03-27 NOTE — H&P (Addendum)
History and Physical    Justin [name] IRS:[id number] DOB: 1980-08-24 DOA: 03/27/2021  PCP: [name], MD   Patient coming from: Home  Chief Complaint: Seizures  HPI: Justin [name] is a 40 y.o. male with medical history significant for HIV, Seizure disorder, hepatitis C, polysubstance abuse who presented by EMS with seizure activity.  According to the report from the ER they patient's roommate heard some noises in his room and when he went into the room he found him having a tonic-clonic seizure that lasted for "several minutes.  EMS was called and when they arrived he was initially postictal.  Patient does not have any recollection of the events of the day that led to him coming to the hospital.  He reports that he did go out drinking with friends last night and had 6-7 alcoholic beverages.  He also admitted to using intranasal heroin a few nights ago.  He states he does not does not drink alcohol on a daily basis.  He reported he was compliant with his Keppra and other chronic medications.  While in the emergency room he had 2 witnessed tonic-clonic seizures and was given Ativan.  The ER PA discussed case with neurology who stated patient could stay at [facility] and have his Keppra dose increased to 1000 mg twice a day and neurology would follow-up as an outpatient.  CT of the head was obtained in the emergency room which was negative.  He had a CMP that was unremarkable in the emergency room  Review of Systems:  Cannot obtain review of systems secondary to patient being postictal  Past Medical History:  Diagnosis Date   Endocarditis    Hepatitis C    History of syphilis 08/25/2014   Treated by [facility] 07-2014.   HIV (human immunodeficiency virus infection) (HCC)    Seizures (HCC)     Past Surgical History:  Procedure Laterality Date   hand surgery Right    LUMBAR PUNCTURE  08/08/2019       WRIST SURGERY      Social History  reports that he has been smoking  cigarettes. He has never used smokeless tobacco. He reports current alcohol use. He reports current drug use. Frequency: 14.00 times per week. Drugs: Marijuana and Methamphetamines.  No Known Allergies  Family History  Adopted: Yes  Family history unknown: Yes     Prior to Admission medications   Medication Sig Start Date End Date Taking? Authorizing Provider  Ascorbic Acid (VITAMIN C PO) Take 1 tablet by mouth daily. Patient not taking: Reported on 03/17/2021    [provider]  B Complex Vitamins (VITAMIN B COMPLEX) TABS Take 1 tablet by mouth daily. Patient not taking: Reported on 03/17/2021    [provider]  bictegravir-emtricitabine-tenofovir AF (BIKTARVY) 50-200-25 MG TABS tablet Take 1 tablet by mouth daily. 07/16/20   [name], MD  levETIRAcetam (KEPPRA) 500 MG tablet Take 1 tablet (500 mg total) by mouth 2 (two) times daily. Patient not taking: No sig reported 01/15/21 03/17/21  Shanon Ace, PA-C  levETIRAcetam (KEPPRA) 500 MG tablet Take 1 tablet (500 mg total) by mouth 2 (two) times daily. 02/04/21   Vanetta Mulders, MD  POTASSIUM PO Take 1 tablet by mouth daily. Patient not taking: Reported on 03/17/2021    [provider]    Physical Exam: Vitals:   03/27/21 2030 03/27/21 2100 03/27/21 2130 03/27/21 2200  BP: 119/83 121/87 131/86 120/86  Pulse: 100 98 84 91  Resp: (!) 30 (!) 23 17 18   Temp:      TempSrc:      SpO2: 97% 97% 99% 100%  Weight:      Height:        Constitutional: NAD, calm, comfortable Vitals:   03/27/21 2030 03/27/21 2100 03/27/21 2130 03/27/21 2200  BP: 119/83 121/87 131/86 120/86  Pulse: 100 98 84 91  Resp: (!) 30 (!) 23 17 18   Temp:      TempSrc:      SpO2: 97% 97% 99% 100%  Weight:      Height:       General: WDWN, Post Ictal.  Awakens to tactile stimulation and loud voice. Eyes: EOMI, pupils 3 mm and reactive. , lid conjunctivae normal.  Sclera nonicteric HENT:  Justin [name], external ears normal.   Nares patent without epistasis.  Mucous membranes are dry. Posterior pharynx clear Neck: Soft, normal range of motion, supple, no masses, Trachea midline Respiratory: clear to auscultation bilaterally, no wheezing, no crackles. Normal respiratory effort. No accessory muscle use.  Cardiovascular: Regular rate and rhythm, no murmurs / rubs / gallops. No extremity edema. 2+ pedal pulses. Abdomen: Soft, no tenderness, nondistended, no rebound or guarding.  No masses palpated. Bowel sounds normoactive Musculoskeletal: Normal passive range of motion. no cyanosis. No joint deformity upper and lower extremities.  Normal muscle tone.  Skin: Warm, dry, intact no rashes, lesions, ulcers. No induration.  Normal skin turgor.  Multiple tattoos. Neurologic: patella DTR +1 bilaterally.  Withdraws from painful stimuli.  Labs on Admission: I have personally reviewed following labs and imaging studies  CBC: No results for input(s): WBC, NEUTROABS, HGB, HCT, MCV, PLT in the last 168 hours.  Basic Metabolic Panel: Recent Labs  Lab 03/27/21 1815  NA 135  K 4.9  CL 103  CO2 23  GLUCOSE 94  BUN 17  CREATININE 1.09  CALCIUM 9.3  MG 2.3    GFR: Estimated Creatinine Clearance: 98.9 mL/min (by C-G formula based on SCr of 1.09 mg/dL).  Liver Function Tests: Recent Labs  Lab 03/27/21 1815  AST 41  ALT 25  ALKPHOS 81  BILITOT 1.1  PROT 7.0  ALBUMIN 4.0    Urine analysis:    Component Value Date/Time   COLORURINE YELLOW 02/04/2021 1756   APPEARANCEUR CLEAR 02/04/2021 1756   LABSPEC >1.030 (H) 02/04/2021 1756   PHURINE 5.5 02/04/2021 1756   GLUCOSEU NEGATIVE 02/04/2021 1756   HGBUR NEGATIVE 02/04/2021 1756   BILIRUBINUR MODERATE (A) 02/04/2021 1756   BILIRUBINUR negative 09/25/2014 1641   KETONESUR 15 (A) 02/04/2021 1756   PROTEINUR NEGATIVE 02/04/2021 1756   UROBILINOGEN 4.0 09/25/2014 1641   UROBILINOGEN 0.2 08/19/2014 1152   NITRITE NEGATIVE 02/04/2021 1756   LEUKOCYTESUR NEGATIVE  02/04/2021 1756    Radiological Exams on Admission: CT HEAD WO CONTRAST (04/06/2021)  Result Date: 03/27/2021 CLINICAL DATA:  Seizure, nontraumatic (Age 76-40y) EXAM: CT HEAD WITHOUT CONTRAST TECHNIQUE: Contiguous axial images were obtained from the base of the skull through the vertex without intravenous contrast. COMPARISON:  June 25, 2020 FINDINGS: Brain: No evidence of acute infarction, hemorrhage, hydrocephalus, extra-axial collection or mass lesion/mass effect. Vascular: No hyperdense vessel or unexpected calcification. Skull: Normal. Negative for fracture or focal lesion. Sinuses/Orbits: No acute finding. Other: None. IMPRESSION: No acute intracranial abnormality. Electronically Signed   By: 19-56y M.D.   On: 03/27/2021 18:26    Assessment/Plan Principal Problem:   Seizure  Mr. Buccieri is admitted to stepdown unit as he has had  multiple seizures today including 2 in the Er that were witnessed.  Given Keppra IV in ER.  Patient reports he was compliant with his Keppra at home ER PA discussed with neurology who recommended increasing Keppra to 1000 mg bid and to discharge on increased dose and neurology will follow up as outpatient. CT head negative.   Active Problems:   Human immunodeficiency virus (HIV) disease  Chronic. Continue Biktarvy which he states he was compliant with    Substance abuse  Need social work consult to assist with outpatient rehab programs that patient may qualify for. Patient reports drinking occasionally and did drink alcohol last night and admitted to using intranasal heroin 2 nights ago.   DVT prophylaxis: Lovenox for DVT prophylaxis Code Status:   Full code Family Communication:  Diagnosis and plan explained to patient.  Further recommendations to follow as clinical indicated Disposition Plan:   Patient is from:  Home  Anticipated DC to:  Home  Anticipated DC date:  Anticipate 2 midnight or more stay in the hospital   Admission status:   Inpatient   Justin Severance Everette Mall MD [facility]  How to contact the [facility] Attending or Consulting provider 7A - 7P or covering provider during after hours 7P -7A, for this patient?   Check the care team in [facility] and look for a) attending/consulting [facility] provider listed and b) the [facility] team listed Log into [email or web address] and use 's universal password to access. If you do not have the password, please contact the hospital operator. Locate the [facility] provider you are looking for under [facility] and page to a number that you can be directly reached. If you still have difficulty reaching the provider, please page the [facility] (Director on Call) for the Hospitalists listed on amion for assistance.  03/27/2021, 11:11 PM    Addendum: Pt has had 3rd seizure in ER and given ativan 2 mg again. (6 mg ativan total). Is not waking up between seizures per nurse.  Will add Vimpat to regimen and discuss with Neurology  Addendum: 0115 am Paged neurology twice but have not heard back. Pt is now awake and talking, answering questions. Will continue Keppra and Vimpat. Monitor closely in stepdown unit

## 2021-03-27 NOTE — ED Notes (Signed)
This RN called into room. Patient noted to have had suspected seizure. Patient post-ictal, snoring respirations, diaphoresis and oral trauma noted. Patient also noted to be hypoxic and unresponsive. Patient repositioned in bed, MD called to bedside. Suction and supplemental O2 provided. 2 mg Ativan IV given per Durwin Nora, MD.

## 2021-03-27 NOTE — ED Notes (Signed)
Seizure pads applied to bed rails

## 2021-03-28 LAB — BASIC METABOLIC PANEL
Anion gap: 10 (ref 5–15)
BUN: 13 mg/dL (ref 6–20)
CO2: 22 mmol/L (ref 22–32)
Calcium: 9.3 mg/dL (ref 8.9–10.3)
Chloride: 102 mmol/L (ref 98–111)
Creatinine, Ser: 0.99 mg/dL (ref 0.61–1.24)
GFR, Estimated: >60 mL/min (ref >60)
Glucose, Bld: 93 mg/dL (ref 70–99)
Potassium: 4.8 mmol/L (ref 3.5–5.1)
Sodium: 134 mmol/L — ABNORMAL LOW (ref 135–145)

## 2021-03-28 LAB — CBC
HCT: 52 % (ref 39.0–52.0)
Hemoglobin: 17.4 g/dL — ABNORMAL HIGH (ref 13.0–17.0)
MCH: 32.8 pg (ref 26.0–34.0)
MCHC: 33.5 g/dL (ref 30.0–36.0)
MCV: 97.9 fL (ref 80.0–100.0)
Platelets: 133 10*3/uL — ABNORMAL LOW (ref 150–400)
RBC: 5.31 MIL/uL (ref 4.22–5.81)
RDW: 11.8 % (ref 11.5–15.5)
WBC: 8.1 10*3/uL (ref 4.0–10.5)
nRBC: 0 % (ref 0.0–0.2)

## 2021-03-28 LAB — RESP PANEL BY RT-PCR (FLU A&B, COVID) ARPGX2
Influenza A by PCR: NEGATIVE
Influenza B by PCR: NEGATIVE
SARS Coronavirus 2 by RT PCR: NEGATIVE

## 2021-03-28 LAB — MRSA NEXT GEN BY PCR, NASAL: MRSA by PCR Next Gen: NOT DETECTED

## 2021-03-28 LAB — TSH: TSH: 0.168 u[IU]/mL — ABNORMAL LOW (ref 0.350–4.500)

## 2021-03-28 MED ORDER — CHLORHEXIDINE GLUCONATE CLOTH 2 % EX PADS
6.0000 | MEDICATED_PAD | Freq: Every day | CUTANEOUS | Status: DC
  Administered 2021-03-28: 6 via TOPICAL

## 2021-03-28 MED ORDER — ADULT MULTIVITAMIN W/MINERALS CH
1.0000 | ORAL_TABLET | Freq: Every day | ORAL | Status: DC
  Administered 2021-03-28: 1 via ORAL
  Filled 2021-03-28: qty 1

## 2021-03-28 MED ORDER — LEVETIRACETAM 500 MG PO TABS: 1000.0000 mg | ORAL_TABLET | Freq: Two times a day (BID) | ORAL | 3 refills | Status: DC

## 2021-03-28 MED ORDER — LEVETIRACETAM IN NACL 1000 MG/100ML IV SOLN
1000.0000 mg | Freq: Two times a day (BID) | INTRAVENOUS | Status: DC
Start: 2021-03-28 — End: 2021-03-28

## 2021-03-28 MED ORDER — ACETAMINOPHEN 325 MG PO TABS
650.0000 mg | ORAL_TABLET | Freq: Four times a day (QID) | ORAL | Status: DC | PRN
  Administered 2021-03-28: 650 mg via ORAL
  Filled 2021-03-28: qty 2

## 2021-03-28 MED ORDER — BICTEGRAVIR-EMTRICITAB-TENOFOV 50-200-25 MG PO TABS
1.0000 | ORAL_TABLET | Freq: Every day | ORAL | Status: DC
  Administered 2021-03-28: 1 via ORAL
  Filled 2021-03-28 (×2): qty 1

## 2021-03-28 MED ORDER — LORAZEPAM 2 MG/ML IJ SOLN: 2.0000 mg | INTRAMUSCULAR | Status: DC | PRN

## 2021-03-28 MED ORDER — MAGIC MOUTHWASH W/LIDOCAINE
10.0000 mL | Freq: Four times a day (QID) | ORAL | Status: DC | PRN
  Filled 2021-03-28: qty 10

## 2021-03-28 MED ORDER — LEVETIRACETAM IN NACL 1000 MG/100ML IV SOLN
1000.0000 mg | Freq: Two times a day (BID) | INTRAVENOUS | Status: DC
Start: 2021-03-28 — End: 2021-03-28
  Administered 2021-03-28: 1000 mg via INTRAVENOUS
  Filled 2021-03-28 (×3): qty 100

## 2021-03-28 MED ORDER — ENOXAPARIN SODIUM 40 MG/0.4ML IJ SOSY
40.0000 mg | PREFILLED_SYRINGE | Freq: Every day | INTRAMUSCULAR | Status: DC
  Administered 2021-03-28: 40 mg via SUBCUTANEOUS
  Filled 2021-03-28: qty 0.4

## 2021-03-28 MED ORDER — ACETAMINOPHEN 650 MG RE SUPP: 650.0000 mg | Freq: Four times a day (QID) | RECTAL | Status: DC | PRN

## 2021-03-28 MED ORDER — LACTATED RINGERS IV SOLN: INTRAVENOUS | Status: DC

## 2021-03-28 NOTE — Progress Notes (Signed)
Patient has been discharged. Cab transportation was arranged, IV removed, and discharge instructions were presented with teach-back. Anda Kraft 03/28/2021

## 2021-03-28 NOTE — ED Notes (Signed)
Patient now in bed, equipment in place including cardiac monitor. Pt resting comfortably.

## 2021-03-28 NOTE — Discharge Summary (Signed)
Physician Discharge Summary  Justin [name] Justin [name]:[id number] DOB: 10-10-1980 DOA: 03/27/2021  PCP: [name], MD  Admit date: 03/27/2021 Discharge date: 03/28/2021  Admitted From: Home Discharge disposition: Home   Code Status: Full Code   Discharge Diagnosis:   Principal Problem:   Seizure (HCC) Active Problems:   Human immunodeficiency virus (HIV) disease (HCC)   Substance abuse (HCC)     Chief Complaint  Patient presents with   Seizures    Brief narrative: Justin [name] is a 40 y.o. male with PMH significant for seizure disorder on Keppra (reports compliance to medication) history of HIV, hepatitis C, polysubstance abuse including alcohol and IV heroin. Patient was brought to the ED on 9/24 by EMS for grand mal seizure.  Evidently patient's roommate heard something from patient's room, went in to find patient having grand mal seizures.  It lasted for several minutes.  By the time of EMS arrival, patient was postictal he had bite injury in his tongue. Per history, the other night, patient went to the bar and took a 6-7 drinks.He also admitted to using intranasal heroin a few nights ago.    While in the ED, patient had 2 more witnessed episodes of tonic-clonic seizure and required IV Ativan.  ED physician discussed with neurology on-call who recommended to increase the dose of Keppra 2000 mg twice daily and an inpatient admission. CT of the head was unremarkable.   Labs unremarkable. Admitted to hospitalist service See below for details  Subjective: Patient was seen and examined this morning.  Young African-American male.  Sleepy.  Opens eyes on verbal command.  Answers few questions and falls right back asleep. Heart rate in 90s, blood pressure in 90s. Later in the day, patient started to wake up.  Mental status improving.  Able to eat.  Hospital course: Breakthrough seizure History of seizure disorder -Brought in the ED for grand mal seizure.  Noticed to  have 2 more while in the ED. -Reports compliance to Keppra 5 mg twice daily at home.  Unclear trigger of breakthrough seizure.  May be related to binge drinking which she admits he did the night before. -Per neurology recommendation, patient is currently on Keppra 1000 mg twice daily and will be continued on the same at the time of discharge.  Acute metabolic encephalopathy Postictal state -This morning, patient was drowsy, probably from a combination of postictal state as well as IV Ativan given in the ED. -Mental status improved during the day.   HIV -Continue Biktarvy  History of hepatitis C  Polysubstance abuse -Urine drug screen positive for amphetamine and THC.[name]  Patient also admit to taking intranasal heroin few days ago. -Counseled to quit alcohol and drugs.  Okay to discharge home today.   Seizure precautions instructed.  Allergies as of 03/28/2021   No Known Allergies      Medication List     STOP taking these medications    POTASSIUM PO   Vitamin B Complex Tabs   VITAMIN C PO       TAKE these medications    Biktarvy 50-200-25 MG Tabs tablet Generic drug: bictegravir-emtricitabine-tenofovir AF Take 1 tablet by mouth daily.   levETIRAcetam 500 MG tablet Commonly known as: Keppra Take 2 tablets (1,000 mg total) by mouth 2 (two) times daily. What changed: how much to take        Discharge Instructions:  Diet Recommendation:   Regular diet   Seizure precautions: -Per [facility] statutes, patients with  seizures are not allowed to drive until they have been seizure-free for six months.  -Use caution when using heavy equipment or power tools.  -Avoid working on ladders or at heights.  -Take showers instead of baths. Ensure the water temperature is not too high on the home water heater.  -Do not go swimming alone.  -Do not lock yourself in a room alone (i.e. bathroom). -When caring for infants or small children, sit down when holding,  feeding, or changing them to minimize risk of injury to the child in the event you have a seizure.  -Maintain good sleep hygiene. -Avoid alcohol.    If patient has another seizure, call 911 and bring them back to the ED if: A.  The seizure lasts longer than 5 minutes.      B.  The patient doesn't wake shortly after the seizure or has new problems such as difficulty seeing, speaking or moving following the seizure C.  The patient was injured during the seizure D.  The patient has a temperature over 102 F (39C) E.  The patient vomited during the seizure and now is having trouble breathing   Please note You were cared for by a hospitalist during your hospital stay. If you have any questions about your discharge medications or the care you received while you were in the hospital after you are discharged, you can call the unit and asked to speak with the hospitalist on call if the hospitalist that took care of you is not available. Once you are discharged, your primary care physician will handle any further medical issues. Please note that NO REFILLS for any discharge medications will be authorized once you are discharged, as it is imperative that you return to your primary care physician (or establish a relationship with a primary care physician if you do not have one) for your aftercare needs so that they can reassess your need for medications and monitor your lab values.    Follow ups:    Follow-up Information     Comer, Belia Heman, MD Follow up.   Specialty: Infectious Diseases Contact information: [address] [place] [address] [phone]                 Wound care:     Discharge Exam:   Vitals:   03/28/21 1200 03/28/21 1252 03/28/21 1300 03/28/21 1400  BP: 122/67  122/66 125/65  Pulse: 93  100 100  Resp:      Temp:  98.2 F (36.8 C)    TempSrc:  Oral    SpO2: 98%  95% 97%  Weight:      Height:        Body mass index is 27.12 kg/m.  General  exam: Not in distress Skin: No rashes, lesions or ulcers. HEENT: Atraumatic, normocephalic, no obvious bleeding Lungs: Clear to auscultate bilaterally CVS: Regular rate and rhythm, no murmur GI/Abd soft, nontender, nondistended, bowel sound present CNS: Later in the day today, he is alert, awake, oriented x3 Psychiatry: Mood appropriate Extremities: No edema, no calf tenderness  Time coordinating discharge: 35 minutes   The results of significant diagnostics from this hospitalization (including imaging, microbiology, ancillary and laboratory) are listed below for reference.    Procedures and Diagnostic Studies:   CT HEAD WO CONTRAST ( )  Result Date: 03/27/2021 CLINICAL DATA:  Seizure, nontraumatic (Age 79-40y) EXAM: CT HEAD WITHOUT CONTRAST TECHNIQUE: Contiguous axial images were obtained from the base of the skull through the vertex without  intravenous contrast. COMPARISON:  June 25, 2020 FINDINGS: Brain: No evidence of acute infarction, hemorrhage, hydrocephalus, extra-axial collection or mass lesion/mass effect. Vascular: No hyperdense vessel or unexpected calcification. Skull: Normal. Negative for fracture or focal lesion. Sinuses/Orbits: No acute finding. Other: None. IMPRESSION: No acute intracranial abnormality. Electronically Signed   By: Meda Klinefelter M.D.   On: 03/27/2021 18:26     Labs:   Basic Metabolic Panel: Recent Labs  Lab 03/27/21 1815 03/28/21 0312  NA 135 134*  K 4.9 4.8  CL 103 102  CO2 23 22  GLUCOSE 94 93  BUN 17 13  CREATININE 1.09 0.99  CALCIUM 9.3 9.3  MG 2.3  --    GFR Estimated Creatinine Clearance: 108.9 mL/min (by C-G formula based on SCr of 0.99 mg/dL). Liver Function Tests: Recent Labs  Lab 03/27/21 1815  AST 41  ALT 25  ALKPHOS 81  BILITOT 1.1  PROT 7.0  ALBUMIN 4.0   No results for input(s): LIPASE, AMYLASE in the last 168 hours. No results for input(s): AMMONIA in the last 168 hours. Coagulation profile No results  for input(s): INR, PROTIME in the last 168 hours.  CBC: Recent Labs  Lab 03/28/21 0312  WBC 8.1  HGB 17.4*  HCT 52.0  MCV 97.9  PLT 133*   Cardiac Enzymes: No results for input(s): CKTOTAL, CKMB, CKMBINDEX, TROPONINI in the last 168 hours. BNP: Invalid input(s): POCBNP CBG: No results for input(s): GLUCAP in the last 168 hours. D-Dimer No results for input(s): DDIMER in the last 72 hours. Hgb A1c No results for input(s): HGBA1C in the last 72 hours. Lipid Profile No results for input(s): CHOL, HDL, LDLCALC, TRIG, CHOLHDL, LDLDIRECT in the last 72 hours. Thyroid function studies Recent Labs    03/28/21 0312  TSH 0.168*   Anemia work up No results for input(s): VITAMINB12, FOLATE, FERRITIN, TIBC, IRON, RETICCTPCT in the last 72 hours. Microbiology Recent Results (from the past 240 hour(s))  Resp Panel by RT-PCR (Flu A&B, Covid) Nasopharyngeal Swab     Status: None   Collection Time: 03/27/21 11:27 PM   Specimen: Nasopharyngeal Swab; Nasopharyngeal(NP) swabs in vial transport medium  Result Value Ref Range Status   SARS Coronavirus 2 by RT PCR NEGATIVE NEGATIVE Final    Comment: (NOTE) SARS-CoV-2 target nucleic acids are NOT DETECTED.  The SARS-CoV-2 RNA is generally detectable in upper respiratory specimens during the acute phase of infection. The lowest concentration of SARS-CoV-2 viral copies this assay can detect is 138 copies/mL. A negative result does not preclude SARS-Cov-2 infection and should not be used as the sole basis for treatment or other patient management decisions. A negative result may occur with  improper specimen collection/handling, submission of specimen other than nasopharyngeal swab, presence of viral mutation(s) within the areas targeted by this assay, and inadequate number of viral copies(<138 copies/mL). A negative result must be combined with clinical observations, patient history, and epidemiological information. The expected result is  Negative.  Fact Sheet for Patients:  BloggerCourse.com  Fact Sheet for Healthcare Providers:  SeriousBroker.it  This test is no t yet approved or cleared by the [place] FDA and  has been authorized for detection and/or diagnosis of SARS-CoV-2 by FDA under an Emergency Use Authorization (EUA). This EUA will remain  in effect (meaning this test can be used) for the duration of the COVID-19 declaration under Section 564(b)(1) of the Act, 21 U.S.C.section 360bbb-3(b)(1), unless the authorization is terminated  or revoked sooner.  Influenza A by PCR NEGATIVE NEGATIVE Final   Influenza B by PCR NEGATIVE NEGATIVE Final    Comment: (NOTE) The Xpert Xpress SARS-CoV-2/FLU/RSV plus assay is intended as an aid in the diagnosis of influenza from Nasopharyngeal swab specimens and should not be used as a sole basis for treatment. Nasal washings and aspirates are unacceptable for Xpert Xpress SARS-CoV-2/FLU/RSV testing.  Fact Sheet for Patients: BloggerCourse.com  Fact Sheet for Healthcare Providers: SeriousBroker.it  This test is not yet approved or cleared by the [place] FDA and has been authorized for detection and/or diagnosis of SARS-CoV-2 by FDA under an Emergency Use Authorization (EUA). This EUA will remain in effect (meaning this test can be used) for the duration of the COVID-19 declaration under Section 564(b)(1) of the Act, 21 U.S.C. section 360bbb-3(b)(1), unless the authorization is terminated or revoked.  Performed at [facility], [address], [place], [place] [address]   MRSA Next Gen by PCR, Nasal     Status: None   Collection Time: 03/28/21  9:27 AM   Specimen: Nasal Mucosa; Nasal Swab  Result Value Ref Range Status   MRSA by PCR Next Gen NOT DETECTED NOT DETECTED Final    Comment: (NOTE) The GeneXpert MRSA Assay (FDA approved  for NASAL specimens only), is one component of a comprehensive MRSA colonization surveillance program. It is not intended to diagnose MRSA infection nor to guide or monitor treatment for MRSA infections. Test performance is not FDA approved in patients less than 26 years old. Performed at [facility], [address], [place], [place] [address]      Signed: Melina Schools Diarra Kos  [facility] 03/28/2021, 2:54 PM

## 2021-03-28 NOTE — Progress Notes (Signed)
PROGRESS NOTE  Justin [name]  DOB: 1980-10-04  PCP: [name], MD YQI:[id number]  DOA: 03/27/2021  LOS: 1 day  Hospital Day: 2   Chief Complaint  Patient presents with   Seizures    Brief narrative: Justin [name] is a 40 y.o. male with PMH significant for seizure disorder on Keppra (reports compliance to medication) history of HIV, hepatitis C, polysubstance abuse including alcohol and IV heroin. Patient was brought to the ED on 9/24 by EMS for grand mal seizure.  Evidently patient's roommate heard something from patient's room, went in to find patient having grand mal seizures.  It lasted for several minutes.  By the time of EMS arrival, patient was postictal he had bite injury in his tongue. Per history, the other night, patient went to the bar and took a 6-7 drinks.He also admitted to using intranasal heroin a few nights ago.    While in the ED, patient had 2 more witnessed episodes of tonic-clonic seizure and required IV Ativan.  ED physician discussed with neurology on-call who recommended to increase the dose of Keppra 2000 mg twice daily and an inpatient admission. CT of the head was unremarkable.   Labs unremarkable. Admitted to hospitalist service See below for details  Subjective: Patient was seen and examined this morning.  Young African-American male.  Sleepy.  Opens eyes on verbal command.  Answers few questions and falls right back asleep. Heart rate in 90s, blood pressure in 90s.  Assessment/Plan: Breakthrough seizure History of seizure disorder -Brought in the ED for grand mal seizure.  Noticed to have 2 more while in the ED. -Reports compliance to Keppra 5 mg twice daily at home.  Unclear trigger of breakthrough seizure.  May be related to binge drinking which she admits he did the night before. -Per neurology recommendation, patient is currently on Keppra 1000 mg twice daily and will be continued on the same at the time of discharge.  Acute  metabolic encephalopathy Postictal state -Currently patient is drowsy, probably from a combination of postictal state as well as IV Ativan given in the ED -Continue to monitor mental status change.  We could be able to discharge him home once mental status improves today or tomorrow.  HIV -Continue Biktarvy  History of hepatitis C  Polysubstance abuse -Urine drug screen positive for amphetamine and THC.[name]  Patient also admit to taking intranasal heroin few days ago. -Counseled to quit alcohol and drugs.  Mobility: Encourage ambulation once mental status improves Code Status:   Code Status: Full Code  Nutritional status: Body mass index is 27.12 kg/m.     Diet: Encourage appetite once mental status improves Diet Order             Diet regular Room service appropriate? Yes; Fluid consistency: Thin  Diet effective now                  DVT prophylaxis:  enoxaparin (LOVENOX) injection 40 mg Start: 03/28/21 1000   Antimicrobials: None Fluid: Currently on LR@100  mill per hour Consultants: Discussed with neurology by ED physician. Family Communication: None at bedside  Status is: Inpatient  Remains inpatient appropriate because: Continues to remain postictal  Dispo: The patient is from: Home              Anticipated d/c is to: Home              Patient currently is not medically stable to d/c.   Difficult to place patient  No     Infusions:   lactated ringers 100 mL/hr at 03/28/21 0843   levETIRAcetam Stopped (03/28/21 0805)    Scheduled Meds:  bictegravir-emtricitabine-tenofovir AF  1 tablet Oral Daily   Chlorhexidine Gluconate Cloth  6 each Topical Daily   enoxaparin (LOVENOX) injection  40 mg Subcutaneous Daily   lacosamide  50 mg Oral BID   multivitamin with minerals  1 tablet Oral Daily    Antimicrobials: Anti-infectives (From admission, onward)    Start     Dose/Rate Route Frequency Ordered Stop   03/28/21 1000  bictegravir-emtricitabine-tenofovir AF  (BIKTARVY) 50-200-25 MG per tablet 1 tablet        1 tablet Oral Daily 03/28/21 0909         PRN meds: acetaminophen **OR** acetaminophen, LORazepam   Objective: Vitals:   03/28/21 0730 03/28/21 0843  BP: 116/75 124/73  Pulse: 90 91  Resp: (!) 9 12  Temp:    SpO2: 99% 99%    Intake/Output Summary (Last 24 hours) at 03/28/2021 1040 Last data filed at 03/28/2021 0805 Gross per 24 hour  Intake 245 ml  Output --  Net 245 ml   Filed Weights   03/27/21 1846  Weight: 90.7 kg   Weight change:  Body mass index is 27.12 kg/m.   Physical Exam: General exam: Young African-American male.  Lethargic and postictal state at this time Skin: No rashes, lesions or ulcers. HEENT: Atraumatic, normocephalic, no obvious bleeding Lungs: Clear to auscultation bilaterally CVS: Regular rate and rhythm, no murmur GI/Abd soft, nontender, nondistended, bowel sound present CNS: Opens eyes on verbal command, answers to questions and falls right back to sleep Psychiatry: Mood appropriate Extremities: No pedal edema, no calf tenderness  Data Review: I have personally reviewed the laboratory data and studies available.  Recent Labs  Lab 03/28/21 0312  WBC 8.1  HGB 17.4*  HCT 52.0  MCV 97.9  PLT 133*   Recent Labs  Lab 03/27/21 1815 03/28/21 0312  NA 135 134*  K 4.9 4.8  CL 103 102  CO2 23 22  GLUCOSE 94 93  BUN 17 13  CREATININE 1.09 0.99  CALCIUM 9.3 9.3  MG 2.3  --     F/u labs ordered Unresulted Labs (From admission, onward)     Start     Ordered   03/29/21 0500  CBC with Differential/Platelet  Tomorrow morning,   R        03/28/21 1040   03/29/21 0500  Basic metabolic panel  Tomorrow morning,   R        03/28/21 1040   03/27/21 1845  CBC with Differential/Platelet  Once,   R        03/27/21 1845   03/27/21 1750  CBC with Differential  Once,   STAT        03/27/21 1749   03/27/21 1750  Urine rapid drug screen (hosp performed)  ONCE - STAT,   STAT        03/27/21 1749             Signed, Lorin Glass, MD [facility] 03/28/2021

## 2021-03-28 NOTE — Discharge Instructions (Signed)
Per [place] DMV statutes, patients with seizures are not allowed to drive until they have been seizure-free for six months.    Use caution when using heavy equipment or power tools. Avoid working on ladders or at heights. Take showers instead of baths. Ensure the water temperature is not too high on the home water heater. Do not go swimming alone. Do not lock yourself in a room alone (i.e. bathroom). When caring for infants or small children, sit down when holding, feeding, or changing them to minimize risk of injury to the child in the event you have a seizure. Maintain good sleep hygiene. Avoid alcohol.    If patient has another seizure, call 911 and bring them back to the ED if: A.  The seizure lasts longer than 5 minutes.      B.  The patient doesn't wake shortly after the seizure or has new problems such as difficulty seeing, speaking or moving following the seizure C.  The patient was injured during the seizure D.  The patient has a temperature over 102 F (39C) E.  The patient vomited during the seizure and now is having trouble breathing  

## 2021-03-28 NOTE — ED Notes (Signed)
Patient found laying in the bed in another room. Redirected back to room, when asked why patient removed all equipment and pulled out IV pt does not respond.

## 2021-03-28 NOTE — ED Notes (Addendum)
Entered pt's room to find bed empty. Patient got up from bed and removed all equipment. Pt then proceeded to pull out IV and left room

## 2021-03-30 ENCOUNTER — Telehealth: Payer: Self-pay

## 2021-03-30 NOTE — Telephone Encounter (Signed)
Transition Care Management Unsuccessful Follow-up Telephone Call  Date of discharge and from where:  03/28/2021 / [facility]  Attempts:  1st Attempt  Reason for unsuccessful TCM follow-up call:  Unable to leave message.  Mailbox is full  George Ina RN,BSN,CCM RN Case Manager [facility] Care Management [phone]

## 2021-03-31 ENCOUNTER — Telehealth: Payer: Self-pay

## 2021-03-31 NOTE — Telephone Encounter (Signed)
Transition Care Management Unsuccessful Follow-up Telephone Call  Date of discharge and from where:  03/28/2021 / [name]  Attempts:  2nd Attempt  Reason for unsuccessful TCM follow-up call:  Left voice message Contact answering phone states patient is not there.  HIPAA complaint message left with call back phone number.   George Ina RN,BSN,CCM RN Case Manager [facility]  [phone]

## 2021-04-04 ENCOUNTER — Other Ambulatory Visit: Payer: Self-pay

## 2021-04-04 ENCOUNTER — Encounter (HOSPITAL_COMMUNITY): Payer: Self-pay | Admitting: Emergency Medicine

## 2021-04-04 ENCOUNTER — Emergency Department (HOSPITAL_COMMUNITY)
Admission: EM | Admit: 2021-04-04 | Discharge: 2021-04-05 | Disposition: A | Discharge status: Home / Self Care | Payer: Self-pay | Attending: Emergency Medicine | Admitting: Emergency Medicine

## 2021-04-04 DIAGNOSIS — R4689 Other symptoms and signs involving appearance and behavior: Secondary | ICD-10-CM

## 2021-04-04 DIAGNOSIS — F1999 Other psychoactive substance use, unspecified with unspecified psychoactive substance-induced disorder: Secondary | ICD-10-CM | POA: Diagnosis present

## 2021-04-04 DIAGNOSIS — F15259 Other stimulant dependence with stimulant-induced psychotic disorder, unspecified: Secondary | ICD-10-CM | POA: Insufficient documentation

## 2021-04-04 DIAGNOSIS — R569 Unspecified convulsions: Secondary | ICD-10-CM | POA: Insufficient documentation

## 2021-04-04 DIAGNOSIS — Y900 Blood alcohol level of less than 20 mg/100 ml: Secondary | ICD-10-CM | POA: Insufficient documentation

## 2021-04-04 DIAGNOSIS — F17210 Nicotine dependence, cigarettes, uncomplicated: Secondary | ICD-10-CM | POA: Insufficient documentation

## 2021-04-04 DIAGNOSIS — Z21 Asymptomatic human immunodeficiency virus [HIV] infection status: Secondary | ICD-10-CM | POA: Insufficient documentation

## 2021-04-04 DIAGNOSIS — Z20822 Contact with and (suspected) exposure to covid-19: Secondary | ICD-10-CM | POA: Insufficient documentation

## 2021-04-04 MED ORDER — LEVETIRACETAM 500 MG PO TABS: 1000.0000 mg | ORAL_TABLET | Freq: Two times a day (BID) | ORAL | Status: DC

## 2021-04-04 MED ORDER — BICTEGRAVIR-EMTRICITAB-TENOFOV 50-200-25 MG PO TABS
1.0000 | ORAL_TABLET | Freq: Every day | ORAL | Status: DC
  Administered 2021-04-05: 1 via ORAL
  Filled 2021-04-04 (×2): qty 1

## 2021-04-04 MED ORDER — LEVETIRACETAM 500 MG PO TABS
500.0000 mg | ORAL_TABLET | Freq: Two times a day (BID) | ORAL | Status: DC
  Administered 2021-04-04 – 2021-04-05 (×2): 500 mg via ORAL
  Filled 2021-04-04 (×2): qty 1

## 2021-04-04 MED ORDER — LIDOCAINE-EPINEPHRINE (PF) 2 %-1:200000 IJ SOLN
10.0000 mL | Freq: Once | INTRAMUSCULAR | Status: AC
  Administered 2021-04-05: 10 mL
  Filled 2021-04-04: qty 20
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
## 2021-04-05 LAB — COMPREHENSIVE METABOLIC PANEL
ALT: 23 U/L (ref 0–44)
AST: 28 U/L (ref 15–41)
Albumin: 4.8 g/dL (ref 3.5–5.0)
Alkaline Phosphatase: 74 U/L (ref 38–126)
Anion gap: 11 (ref 5–15)
BUN: 13 mg/dL (ref 6–20)
CO2: 24 mmol/L (ref 22–32)
Calcium: 9.8 mg/dL (ref 8.9–10.3)
Chloride: 101 mmol/L (ref 98–111)
Creatinine, Ser: 1.13 mg/dL (ref 0.61–1.24)
GFR, Estimated: >60 mL/min (ref >60)
Glucose, Bld: 73 mg/dL (ref 70–99)
Potassium: 3.4 mmol/L — ABNORMAL LOW (ref 3.5–5.1)
Sodium: 136 mmol/L (ref 135–145)
Total Bilirubin: 2.4 mg/dL — ABNORMAL HIGH (ref 0.3–1.2)
Total Protein: 8.3 g/dL — ABNORMAL HIGH (ref 6.5–8.1)

## 2021-04-05 LAB — RAPID URINE DRUG SCREEN, HOSP PERFORMED
Amphetamines: POSITIVE — AB
Barbiturates: NOT DETECTED
Benzodiazepines: NOT DETECTED
Cocaine: NOT DETECTED
Opiates: NOT DETECTED
Tetrahydrocannabinol: NOT DETECTED

## 2021-04-05 LAB — CBC WITH DIFFERENTIAL/PLATELET
Abs Immature Granulocytes: 0.01 10*3/uL (ref 0.00–0.07)
Basophils Absolute: 0 10*3/uL (ref 0.0–0.1)
Basophils Relative: 1 %
Eosinophils Absolute: 0 10*3/uL (ref 0.0–0.5)
Eosinophils Relative: 0 %
HCT: 53 % — ABNORMAL HIGH (ref 39.0–52.0)
Hemoglobin: 17.3 g/dL — ABNORMAL HIGH (ref 13.0–17.0)
Immature Granulocytes: 0 %
Lymphocytes Relative: 34 %
Lymphs Abs: 2 10*3/uL (ref 0.7–4.0)
MCH: 32 pg (ref 26.0–34.0)
MCHC: 32.6 g/dL (ref 30.0–36.0)
MCV: 98 fL (ref 80.0–100.0)
Monocytes Absolute: 0.5 10*3/uL (ref 0.1–1.0)
Monocytes Relative: 8 %
Neutro Abs: 3.5 10*3/uL (ref 1.7–7.7)
Neutrophils Relative %: 57 %
Platelets: 223 10*3/uL (ref 150–400)
RBC: 5.41 MIL/uL (ref 4.22–5.81)
RDW: 11.9 % (ref 11.5–15.5)
WBC: 6 10*3/uL (ref 4.0–10.5)
nRBC: 0.5 % — ABNORMAL HIGH (ref 0.0–0.2)

## 2021-04-05 LAB — SALICYLATE LEVEL: Salicylate Lvl: <7 mg/dL — ABNORMAL LOW (ref 7.0–30.0)

## 2021-04-05 LAB — ACETAMINOPHEN LEVEL: Acetaminophen (Tylenol), Serum: <10 ug/mL — ABNORMAL LOW (ref 10–30)

## 2021-04-05 LAB — ETHANOL: Alcohol, Ethyl (B): <10 mg/dL (ref <10)

## 2021-04-05 LAB — RESP PANEL BY RT-PCR (FLU A&B, COVID) ARPGX2
Influenza A by PCR: NEGATIVE
Influenza B by PCR: NEGATIVE
SARS Coronavirus 2 by RT PCR: NEGATIVE

## 2021-04-05 MED ORDER — IBUPROFEN 200 MG PO TABS
600.0000 mg | ORAL_TABLET | Freq: Once | ORAL | Status: AC
  Administered 2021-04-05: 600 mg via ORAL
  Filled 2021-04-05: qty 3

## 2021-04-05 NOTE — Discharge Instructions (Signed)
For your behavioral health needs you are advised to follow up with [facility] at your earliest opportunity:      [facility]      [address]      [place], [place] [address]      [phone]      They offer psychiatry/medication management, therapy and substance abuse treatment.  New patients are seen in their walk-in clinic.  Walk-in hours are Monday - Thursday from 8:00 am - 11:00 am for psychiatry, and Friday from 1:00 pm - 4:00 pm for therapy.  Walk-in patients are seen on a first come, first served basis, so try to arrive as early as possible for the best chance of being seen the same day.  Please note that to be eligible for services you must bring an ID or a piece of mail with your name and a [place] address.  

## 2021-04-05 NOTE — ED Notes (Signed)
Belonging bag given to patient. Bag searched by sheriff first.

## 2021-04-05 NOTE — ED Notes (Signed)
Pt given breakfast tray. Patient made aware we need UA. Patient requesting something for pain. EDP made aware.
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
## 2021-04-05 NOTE — BH Assessment (Signed)
[facility] Assessment Progress Note   Per Dorena Bodo, NP, this pt does not require psychiatric hospitalization at this time.  Pt presents under IVC initiated by a friend and upheld by EDP Kristine Royal, MD, which has been rescinded by Nelly Rout, MD.  Pt is psychiatrically cleared.  Discharge instructions include referral information for [facility]  Dr Rodena Medin and pt's nurse, Whitney Post, have been notified.  Doylene Canning, MA Triage Specialist [phone]
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
## 2021-04-05 NOTE — ED Notes (Signed)
Pt's belongings were placed in one bag kept under the ice machine in triage.

## 2021-05-04 ENCOUNTER — Telehealth: Payer: Self-pay

## 2021-05-04 NOTE — Telephone Encounter (Signed)
Received call from RN [facility] coordinator informing [facility] patient recently released from [facility] and is now in the hospital  in [place] related to seizures. Advised RN that patient has missed serval appointments and we would be happy to schedule patient. Patient scheduled with [facility]. Patient ADAP up to date. If patient needs transportation assistance patient can reach out to office prior to appointment to confirm address as [facility] and [facility] has two different address on file. Home phone number updated.  Justin [name]

## 2021-05-10 ENCOUNTER — Emergency Department (HOSPITAL_COMMUNITY)
Admission: EM | Admit: 2021-05-10 | Discharge: 2021-05-10 | Disposition: A | Discharge status: Home / Self Care | Payer: Self-pay | Attending: Emergency Medicine | Admitting: Emergency Medicine

## 2021-05-10 ENCOUNTER — Other Ambulatory Visit: Payer: Self-pay

## 2021-05-10 ENCOUNTER — Encounter (HOSPITAL_COMMUNITY): Payer: Self-pay

## 2021-05-10 DIAGNOSIS — Z20822 Contact with and (suspected) exposure to covid-19: Secondary | ICD-10-CM | POA: Insufficient documentation

## 2021-05-10 DIAGNOSIS — Z046 Encounter for general psychiatric examination, requested by authority: Secondary | ICD-10-CM | POA: Insufficient documentation

## 2021-05-10 DIAGNOSIS — R45851 Suicidal ideations: Secondary | ICD-10-CM | POA: Insufficient documentation

## 2021-05-10 DIAGNOSIS — F17210 Nicotine dependence, cigarettes, uncomplicated: Secondary | ICD-10-CM | POA: Insufficient documentation

## 2021-05-10 DIAGNOSIS — Z21 Asymptomatic human immunodeficiency virus [HIV] infection status: Secondary | ICD-10-CM | POA: Insufficient documentation

## 2021-05-10 DIAGNOSIS — Y900 Blood alcohol level of less than 20 mg/100 ml: Secondary | ICD-10-CM | POA: Insufficient documentation

## 2021-05-10 DIAGNOSIS — F1914 Other psychoactive substance abuse with psychoactive substance-induced mood disorder: Secondary | ICD-10-CM | POA: Insufficient documentation

## 2021-05-10 DIAGNOSIS — R451 Restlessness and agitation: Secondary | ICD-10-CM | POA: Insufficient documentation

## 2021-05-10 LAB — RESP PANEL BY RT-PCR (FLU A&B, COVID) ARPGX2
Influenza A by PCR: NEGATIVE
Influenza B by PCR: NEGATIVE
SARS Coronavirus 2 by RT PCR: NEGATIVE

## 2021-05-10 LAB — BASIC METABOLIC PANEL
Anion gap: 6 (ref 5–15)
BUN: 17 mg/dL (ref 6–20)
CO2: 21 mmol/L — ABNORMAL LOW (ref 22–32)
Calcium: 7.5 mg/dL — ABNORMAL LOW (ref 8.9–10.3)
Chloride: 114 mmol/L — ABNORMAL HIGH (ref 98–111)
Creatinine, Ser: 1.05 mg/dL (ref 0.61–1.24)
GFR, Estimated: >60 mL/min (ref >60)
Glucose, Bld: 85 mg/dL (ref 70–99)
Potassium: 3.1 mmol/L — ABNORMAL LOW (ref 3.5–5.1)
Sodium: 141 mmol/L (ref 135–145)

## 2021-05-10 LAB — COMPREHENSIVE METABOLIC PANEL
ALT: 23 U/L (ref 0–44)
AST: 32 U/L (ref 15–41)
Albumin: 4.2 g/dL (ref 3.5–5.0)
Alkaline Phosphatase: 71 U/L (ref 38–126)
Anion gap: 10 (ref 5–15)
BUN: 22 mg/dL — ABNORMAL HIGH (ref 6–20)
CO2: 19 mmol/L — ABNORMAL LOW (ref 22–32)
Calcium: 8.7 mg/dL — ABNORMAL LOW (ref 8.9–10.3)
Chloride: 111 mmol/L (ref 98–111)
Creatinine, Ser: 1.49 mg/dL — ABNORMAL HIGH (ref 0.61–1.24)
GFR, Estimated: >60 mL/min (ref >60)
Glucose, Bld: 126 mg/dL — ABNORMAL HIGH (ref 70–99)
Potassium: 3 mmol/L — ABNORMAL LOW (ref 3.5–5.1)
Sodium: 140 mmol/L (ref 135–145)
Total Bilirubin: 2.4 mg/dL — ABNORMAL HIGH (ref 0.3–1.2)
Total Protein: 7 g/dL (ref 6.5–8.1)

## 2021-05-10 LAB — CBC
HCT: 43.8 % (ref 39.0–52.0)
Hemoglobin: 15 g/dL (ref 13.0–17.0)
MCH: 31.9 pg (ref 26.0–34.0)
MCHC: 34.2 g/dL (ref 30.0–36.0)
MCV: 93.2 fL (ref 80.0–100.0)
Platelets: 180 10*3/uL (ref 150–400)
RBC: 4.7 MIL/uL (ref 4.22–5.81)
RDW: 11.6 % (ref 11.5–15.5)
WBC: 7 10*3/uL (ref 4.0–10.5)
nRBC: 0 % (ref 0.0–0.2)

## 2021-05-10 LAB — RAPID URINE DRUG SCREEN, HOSP PERFORMED
Amphetamines: POSITIVE — AB
Barbiturates: NOT DETECTED
Benzodiazepines: NOT DETECTED
Cocaine: NOT DETECTED
Opiates: NOT DETECTED
Tetrahydrocannabinol: POSITIVE — AB

## 2021-05-10 LAB — ETHANOL: Alcohol, Ethyl (B): <10 mg/dL (ref <10)

## 2021-05-10 MED ORDER — BICTEGRAVIR-EMTRICITAB-TENOFOV 50-200-25 MG PO TABS
1.0000 | ORAL_TABLET | Freq: Every day | ORAL | Status: DC
  Administered 2021-05-10: 1 via ORAL
  Filled 2021-05-10: qty 1

## 2021-05-10 MED ORDER — SODIUM CHLORIDE 0.9 % IV BOLUS
1000.0000 mL | Freq: Once | INTRAVENOUS | Status: AC
  Administered 2021-05-10: 1000 mL via INTRAVENOUS

## 2021-05-10 MED ORDER — LEVETIRACETAM 500 MG PO TABS
500.0000 mg | ORAL_TABLET | Freq: Two times a day (BID) | ORAL | Status: DC
  Administered 2021-05-10: 500 mg via ORAL
  Filled 2021-05-10: qty 1

## 2021-05-10 NOTE — ED Notes (Signed)
Pt being dcd now, ride arranged by LCSW.

## 2021-05-10 NOTE — ED Provider Notes (Addendum)
[facility]-EMERGENCY DEPT Provider Note  CSN: [id number] Arrival date & time: 05/10/21 0049  Chief Complaint(s) Aggressive Behavior and IVC   HPI Justin [name] is a 40 y.o. male with a past medical history listed below including polysubstance abuse who presents to the emergency department in custody of [employer] who has IVC the patient for suicidal ideations.  Patient was taken into custody at a local motel.  It was reported that the motel attendant requested that the patient leave the premises since he was not one of the paying tenants.  When the patient refused [employer] was called.  While being arrested, the patient reportedly asked [employer] to shoot him/kill him.  Currently the patient is denying any SI, HI, AVH.   HPI  Past Medical History Past Medical History:  Diagnosis Date   Endocarditis    Hepatitis C    History of syphilis 08/25/2014   Treated by [facility] 07-2014.   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 Authorizing Provider  BIKTARVY 50-200-25 MG TABS tablet TAKE 1 TABLET BY MOUTH DAILY 03/29/21   Comer, Belia Heman, MD  levETIRAcetam (KEPPRA) 500 MG tablet Take 2 tablets (1,000 mg total) by mouth 2 (two) times daily. Patient taking differently: Take 500 mg by mouth 2 (two) times daily. 03/28/21   Lorin Glass, MD                                                                                                                                    Past Surgical History Past Surgical History:  Procedure Laterality Date   hand surgery Right    LUMBAR PUNCTURE  08/08/2019       WRIST SURGERY     Family History Family History  Adopted: Yes  Family history unknown: Yes    Social History Social History   Tobacco Use   Smoking status: Every Day  Types: Cigarettes   Smokeless tobacco: Never  Vaping Use   Vaping Use: Never used  Substance Use Topics   Alcohol use: Yes    Comment: 1-2 a week    Drug use: Yes    Frequency: 14.0 times per week    Types: Marijuana, Methamphetamines   Allergies Patient has no known allergies.  Review of Systems Review of Systems All other systems are reviewed and are negative for acute change except as noted in the HPI  Physical Exam Vital Signs  I have reviewed the triage vital signs BP 108/74   Pulse (!) 109   Temp 98.4 F (36.9 C) (Oral)   Resp 18   Ht 6' (1.829 m)   Wt 90.7 kg   SpO2 93%   BMI 27.12 kg/m   Physical Exam Vitals reviewed.  Constitutional:      General: He is not in acute distress.    Appearance: He is well-developed. He is not diaphoretic.  HENT:     Head: Normocephalic and atraumatic.     Nose: Nose normal.  Eyes:     General: No scleral icterus.       Right eye: No discharge.        Left eye: No discharge.     Conjunctiva/sclera: Conjunctivae normal.     Pupils: Pupils are equal, round,  and reactive to light.  Cardiovascular:     Rate and Rhythm: Normal rate and regular rhythm.     Heart sounds: No murmur heard.   No friction rub. No gallop.  Pulmonary:     Effort: Pulmonary effort is normal. No respiratory distress.     Breath sounds: Normal breath sounds. No stridor. No rales.  Abdominal:     General: There is no distension.     Palpations: Abdomen is soft.     Tenderness: There is no abdominal tenderness.  Musculoskeletal:        General: No tenderness.     Cervical back: Normal range of motion and neck supple.  Skin:    General: Skin is warm and dry.     Findings: No erythema or rash.  Neurological:     Mental Status: He is alert and oriented to person, place, and time.    ED Results and Treatments Labs (all labs ordered are listed, but only abnormal results are displayed) Labs Reviewed  COMPREHENSIVE METABOLIC PANEL - Abnormal; Notable for the following components:      Result Value   Potassium 3.0 (*)    CO2 19 (*)    Glucose, Bld 126 (*)    BUN 22 (*)    Creatinine, Ser 1.49 (*)    Calcium 8.7 (*)    Total Bilirubin 2.4 (*)    All other components within normal limits  RAPID URINE DRUG SCREEN, HOSP PERFORMED - Abnormal; Notable for the following components:   Amphetamines POSITIVE (*)    Tetrahydrocannabinol POSITIVE (*)    All other components within normal limits  BASIC METABOLIC PANEL - Abnormal; Notable for the following components:   Potassium 3.1 (*)    Chloride 114 (*)    CO2 21 (*)    Calcium 7.5 (*)    All other components within normal limits  RESP PANEL BY RT-PCR (FLU A&B, COVID) ARPGX2  ETHANOL  CBC  EKG  EKG Interpretation  Date/Time:    Ventricular Rate:    PR Interval:    QRS Duration:   QT Interval:    QTC Calculation:   R Axis:     Text Interpretation:         Radiology No results  found.  Pertinent labs & imaging results that were available during my care of the patient were reviewed by me and considered in my medical decision making (see MDM for details).  Medications Ordered in ED Medications  bictegravir-emtricitabine-tenofovir AF (BIKTARVY) 50-200-25 MG per tablet 1 tablet (has no administration in time range)  levETIRAcetam (KEPPRA) tablet 500 mg (has no administration in time range)  sodium chloride 0.9 % bolus 1,000 mL (0 mLs Intravenous Stopped 05/10/21 0306)  sodium chloride 0.9 % bolus 1,000 mL (0 mLs Intravenous Stopped 05/10/21 0529)                                                                                                                                     Procedures Procedures  (including critical care time)  Medical Decision Making / ED Course I have reviewed the nursing notes for this encounter and the patient's prior records (if available in EHR or on provided paperwork).  TAYVION PESSIN was evaluated in Emergency Department on 05/10/2021 for the symptoms described in the history of present illness. He was evaluated in the context of the global COVID-19 pandemic, which necessitated consideration that the patient might be at risk for infection with the SARS-CoV-2 virus that causes COVID-19. Institutional protocols and algorithms that pertain to the evaluation of patients at risk for COVID-19 are in a state of rapid change based on information released by regulatory bodies including the CDC and federal and state organizations. These policies and algorithms were followed during the patient's care in the ED.  Clinical Course as of 05/10/21 [id number]  [facility] May 10, 2021  0415 Patient IVC by [employer]. First exam completed. Screening labs obtain notable for mild AKI. Given IV fluids.  Patient evaluated by behavioral health who recommended inpatient management. [PC]    Clinical Course User Index [PC] Paxon Propes, Grayce Sessions, MD    After IV fluids to repeat  BMP obtain to ensure renal function improvement. If improved patient will be medically cleared for behavioral health disposition.  AKI resoved.  Pertinent labs & imaging results that were available during my care of the patient were reviewed by me and considered in my medical decision making:    Final Clinical Impression(s) / ED Diagnoses Final diagnoses:  Agitation  Suicidal ideation     This chart was dictated using voice recognition software.  Despite best efforts to proofread,  errors can occur which can change the documentation meaning.    Fatima Blank, MD 05/10/21 [phone]

## 2021-05-10 NOTE — Consult Note (Signed)
  Pt is a 40 year old single male who presents unaccompanied to [facility] ED via [employer]. No IVC paperwork is currently available. Per medical record, Pt a history of psychotic symptoms, substance use, suicide attempts and frequent presentations to emergency services. Pt states tonight he was at a party and he had an argument with a friend, Pt unable to elaborate. He says someone called [employer] examiner. According to medical record, alcohol and drugs were being used at the party but no one would confess. Pt got naked, became combative with law enforcement. He was tased and EMS gave Pt 5 mg of Haldol.   Patient is a 40 year old male with reported past psychiatric history of schizophrenia and substance abuse, who was admitted on 05/10/21  under IVC due to aggressive behaviors.    On assessment today, the patient states he feels better but disappointed that he made such poor decisions last night. He states he has been having seizures and feels that they cause some memory impairment and confusion.  He will not describe his mood further other than to say he is sad. He denies feeling paranoid, mania, delusional, emotional lability, or anxiety. He denies AVH, SI, HI, ideas of reference, or first rank symptoms. He denies physical complaints and appears psychiatrically stable to discharge home.   Patient is psych cleared at this time to discharge home.  -TOC consult ordered for discharge and transportation to hotel and his apartment.

## 2021-05-10 NOTE — BH Assessment (Signed)
Clinician messaged Cathie Olden, RN: "Hey. It's Trey with TTS. I seen the pt was given 5mg  of Haldol. Is he able to engage in assessment? Also is he under IVC?"    Clinician awaiting response.    , MS, [facility], [facility] Triage Specialist [phone]

## 2021-05-10 NOTE — Progress Notes (Signed)
TOC CSW was consulted for transportation needs. CSW spoke with pt, who stated he left his apartment to go to a hotel, from the hotel he came to the hospital. Pt stated he left his belongings at the hotel and is requesting a ride to the hotel and then to his apt address [address] [place] [address]. CSW informed pt the hospital does not provide two-way rides. CSW informed pt he will need to decide what address he would like to d/c to.  Pt was unable to provide exact name and address of hotel. Pt will be d/c back to his address in chart. CSW to contact [facility] [employer] for ride upon d/c    [place].Makail Watling, MSW, LCSWA [facility]  Transitions of Care Clinical Social Worker I Direct Dial: [phone]  Fax: [phone] [email or web address]
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
## 2021-05-10 NOTE — ED Notes (Signed)
From [place]:  TTS completed. Roselyn Bering, NP recommends inpatient psychiatric treatment. AC at [facility] will review for possible admission.
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
## 2021-05-19 ENCOUNTER — Ambulatory Visit: Payer: Self-pay | Admitting: Pharmacist

## 2021-05-23 ENCOUNTER — Other Ambulatory Visit: Payer: Self-pay

## 2021-05-23 ENCOUNTER — Emergency Department (HOSPITAL_COMMUNITY)
Admission: EM | Admit: 2021-05-23 | Discharge: 2021-05-23 | Disposition: A | Discharge status: Home / Self Care | Payer: Self-pay | Attending: Emergency Medicine | Admitting: Emergency Medicine

## 2021-05-23 ENCOUNTER — Encounter (HOSPITAL_COMMUNITY): Payer: Self-pay

## 2021-05-23 DIAGNOSIS — Z21 Asymptomatic human immunodeficiency virus [HIV] infection status: Secondary | ICD-10-CM | POA: Insufficient documentation

## 2021-05-23 DIAGNOSIS — F17210 Nicotine dependence, cigarettes, uncomplicated: Secondary | ICD-10-CM | POA: Insufficient documentation

## 2021-05-23 DIAGNOSIS — Z79899 Other long term (current) drug therapy: Secondary | ICD-10-CM | POA: Insufficient documentation

## 2021-05-23 DIAGNOSIS — R569 Unspecified convulsions: Secondary | ICD-10-CM | POA: Insufficient documentation

## 2021-05-23 MED ORDER — LEVETIRACETAM IN NACL 1000 MG/100ML IV SOLN
1000.0000 mg | Freq: Two times a day (BID) | INTRAVENOUS | Status: DC
  Administered 2021-05-23: 1000 mg via INTRAVENOUS
  Filled 2021-05-23: qty 100

## 2021-05-23 MED ORDER — LORAZEPAM 2 MG/ML IJ SOLN: 2.0000 mg | Freq: Once | INTRAMUSCULAR | Status: AC

## 2021-05-23 MED ORDER — LORAZEPAM 2 MG/ML IJ SOLN
INTRAMUSCULAR | Status: AC
  Administered 2021-05-23: 2 mg via INTRAMUSCULAR
  Filled 2021-05-23: qty 1

## 2021-05-23 MED ORDER — LORAZEPAM 2 MG/ML IJ SOLN: 2.0000 mg | Freq: Once | INTRAMUSCULAR | Status: DC

## 2021-05-23 NOTE — ED Notes (Signed)
Pt given a sandwich, applesauce, a coke, and a blanket for discharge. Pt A&O x 4. Pt understood d/c instructions

## 2021-05-23 NOTE — ED Triage Notes (Signed)
Pt arrives via ems, reports he had a witnessed seizure and was post-ictal after. Pt is now alert and oriented. Pt reports he has ran out of his seizure meds. Last dose was two days ago.

## 2021-05-23 NOTE — Discharge Instructions (Signed)
As discussed, your evaluation today has been largely reassuring.  But, it is important that you monitor your condition carefully, and do not hesitate to return to the ED if you develop new, or concerning changes in your condition. It is very important you obtain and take your medication as prescribed.  Medication is waiting at the [facility] on [address] [place].

## 2021-05-23 NOTE — ED Provider Notes (Signed)
[facility] Provider Note   CSN: [id number] Arrival date & time: 05/23/21  1747     History Chief Complaint  Patient presents with   Seizures    Justin [name] is a 40 y.o. male.  HPI Patient presents after seizure.  Patient is postictal, not providing details of his history, level 5 caveat secondary to mental status change.  Per report the patient had witnessed seizure just prior to ED arrival, was sent via EMS here.  Soon after arrival patient had another seizure.  In between the patient seemingly stated that he has not been taking his Keppra recently.    Past Medical History:  Diagnosis Date   Endocarditis    Hepatitis C    History of syphilis 08/25/2014   Treated by [facility] 07-2014.   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   Acute psychosis (HCC)    Substance use disorder 08/07/2019   Encephalopathy 08/06/2019   Chronic hepatitis C without hepatic coma (HCC) 08/17/2017   Screening examination for venereal disease 05/03/2017   Encounter for long-term (current) use of high-risk medication 05/03/2017   Anxiety 09/26/2016   Substance abuse (HCC) 09/26/2016   Human immunodeficiency virus (HIV) disease (HCC) 09/02/2014   History of syphilis 08/25/2014    Past Surgical History:  Procedure Laterality Date   hand surgery Right    LUMBAR PUNCTURE  08/08/2019       WRIST  SURGERY         Family History  Adopted: Yes  Family history unknown: Yes    Social History   Tobacco Use   Smoking status: Every Day    Types: Cigarettes   Smokeless tobacco: Never  Vaping Use   Vaping Use: Never used  Substance Use Topics   Alcohol use: Yes    Comment: 1-2 a week    Drug use: Yes    Frequency: 14.0 times per week    Types: Marijuana, Methamphetamines    Home Medications Prior to Admission medications   Medication Sig Start Date End Date Taking? Authorizing Provider  BIKTARVY 50-200-25 MG TABS tablet TAKE 1 TABLET BY MOUTH DAILY 03/29/21   Comer, Belia Heman, MD  levETIRAcetam (KEPPRA) 500 MG tablet Take 2 tablets (1,000 mg total) by mouth 2 (two) times daily. Patient taking differently: Take 500 mg by mouth 2 (two) times daily. 03/28/21   Lorin Glass, MD    Allergies    Patient has no known allergies.  Review of Systems   Review of Systems  Unable to perform ROS: Acuity of condition   Physical Exam Updated Vital Signs BP 139/88   Pulse 89   Temp 98.1 F (36.7 C)   Resp 17   SpO2 99%   Physical Exam Vitals and nursing note reviewed.  Constitutional:      General: He is not in acute distress.    Appearance: He is well-developed.     Comments: Groggy appearing large adult male directable, but  essentially noninteractive verbally.  HENT:     Head: Normocephalic and atraumatic.  Eyes:     Conjunctiva/sclera: Conjunctivae normal.  Cardiovascular:     Rate and Rhythm: Normal rate and regular rhythm.  Pulmonary:     Effort: Pulmonary effort is normal. No respiratory distress.     Breath sounds: No stridor.  Abdominal:     General: There is no distension.  Skin:    General: Skin is warm and dry.  Neurological:     Comments: Moves all extremity spontaneously, slow to interact.  Psychiatric:        Cognition and Memory: Cognition is impaired. Memory is impaired.    ED Results / Procedures / Treatments   Labs (all labs ordered are  listed, but only abnormal results are displayed) Labs Reviewed - No data to display  EKG None  Radiology No results found.  Procedures Procedures   Medications Ordered in ED Medications  levETIRAcetam (KEPPRA) IVPB 1000 mg/100 mL premix (1,000 mg Intravenous New Bag/Given 05/23/21 1828)  LORazepam (ATIVAN) injection 2 mg (2 mg Intramuscular Given 05/23/21 1819)    ED Course  I have reviewed the triage vital signs and the nursing notes.  Pertinent labs & imaging results that were available during my care of the patient were reviewed by me and considered in my medical decision making (see chart for details).  Clinical Course as of 05/23/21 1901  Nancy Fetter May 23, 2021  [id number] Patient had been very pleasant and respectful.  Patient had a sudden change in behavior.  He became diaphoretic , was found standing next to a wheelchair that was knocked over and was drooling.  He was wondering around, not responding.  Suspect complex seizure.  He was taken back to room.   [EH]    Clinical Course User Index [EH] Lorin Glass, PA-C  Triage notes acknowledged.  With concern for ongoing seizure activity patient received Ativan, loaded with Keppra, continuous monitoring.   8:52 PM Patient awake, alert, speaking clearly, states that he feels better.  He acknowledges not taking his Keppra recently.  He has no complaints.  No evidence for trauma, no distress, some suspicion for the patient's medication noncompliance, contributing to his episodes of seizure here.  Patient will be discharged with Keppra prescription. MDM Rules/Calculators/A&P  Final Clinical Impression(s) / ED Diagnoses Final diagnoses:  Seizure [facility])     Carmin Muskrat, MD 05/23/21 2053

## 2021-05-23 NOTE — ED Provider Notes (Signed)
Emergency Medicine Provider Triage Evaluation Note  Justin [name] , a 40 y.o. male  was evaluated in triage.  Pt complains of seizure.  He states that he had a witnessed seizure today.  He states that he has not taken his seizure meds in about 2 days.  Denies any fevers or recent sickness.  He denies any injuries or other concerns today.  Review of Systems  Positive: seizure Negative: fever  Physical Exam  BP 139/88   Pulse 89   Temp 98.1 F (36.7 C)   Resp 17   SpO2 99%  Gen:   Awake, no distress   Resp:  Normal effort  MSK:   Moves extremities without difficulty  Other:  Normal speech.   Medical Decision Making  Medically screening exam initiated at 6:05 PM.  Appropriate orders placed.  Justin [name] was informed that the remainder of the evaluation will be completed by another provider, this initial triage assessment does not replace that evaluation, and the importance of remaining in the ED until their evaluation is complete.  Note: Portions of this report may have been transcribed using voice recognition software. Every effort was made to ensure accuracy; however, inadvertent computerized transcription errors may be present    Justin [name] 05/23/21 1807    Gerhard Munch, MD 05/23/21 2330

## 2021-06-21 ENCOUNTER — Ambulatory Visit (HOSPITAL_COMMUNITY)
Admission: EM | Admit: 2021-06-21 | Discharge: 2021-06-21 | Disposition: A | Discharge status: Home / Self Care | Payer: No Payment, Other | Attending: Psychiatry | Admitting: Psychiatry

## 2021-06-21 DIAGNOSIS — R441 Visual hallucinations: Secondary | ICD-10-CM | POA: Insufficient documentation

## 2021-06-21 DIAGNOSIS — F1994 Other psychoactive substance use, unspecified with psychoactive substance-induced mood disorder: Secondary | ICD-10-CM | POA: Insufficient documentation

## 2021-06-21 DIAGNOSIS — Z9114 Patient's other noncompliance with medication regimen: Secondary | ICD-10-CM | POA: Insufficient documentation

## 2021-06-21 DIAGNOSIS — R440 Auditory hallucinations: Secondary | ICD-10-CM | POA: Insufficient documentation

## 2021-06-21 DIAGNOSIS — G40909 Epilepsy, unspecified, not intractable, without status epilepticus: Secondary | ICD-10-CM | POA: Insufficient documentation

## 2021-06-21 NOTE — BH Assessment (Signed)
Pt referred here from [facility] for assessment possible detox from meth before they will admit him. Pt denies SI, HI. Reports non command AH and VH of seeing people. Last use of meth and 12 pack of bee about 4-5 days ago. Reports history of seizures. Pt here with a friend/advocate.   Pt is routine.

## 2021-06-21 NOTE — ED Provider Notes (Signed)
Behavioral Health Urgent Care Medical Screening Exam  Patient Name: Justin [name] MRN: [id number] Date of Evaluation: 06/21/21 Chief Complaint:   Diagnosis:  Final diagnoses:  Substance induced mood disorder ([place])    History of Present illness: Justin [name] is a 40 y.o. male presents to [facility] urgent care accompanied by male friend.  They are requesting detox in order to get into [facility] for residential treatment.  States they were referred to [facility] and needed to be restarted on medication for 2 weeks until his bed becomes available.  He denied suicidal or homicidal ideations.  Reported intermittent auditory and visual hallucinations.  Reports last using methamphetamine 2 days ago.  Was reported patient has a seizure disorder and has not been compliant with medications.  NP spoke to [facility] regarding detox requirements.  She reports she referred patient to there sister facility in [place] and/or [place].  Patient made aware.  Patient to discharge and follow-up with [facility] for inpatient residential treatment services.  Support, encouragement  and reassurance was provided.  Justin [name], 40 y.o., male patient seen face to face by this provider and chart reviewed on 06/21/21.  On evaluation Justin [name] reports   During evaluation Justin [name] is sitting  in no acute distress.  He is alert/oriented x 4; calm/cooperative; and mood congruent with affect.  He is speaking in a clear tone at moderate volume, and normal pace; with good eye contact. His thought process is coherent and relevant; There is no indication that he is currently responding to internal/external stimuli or experiencing delusional thought content; and he has denied suicidal/self-harm/homicidal ideation, psychosis, and paranoia.   Patient has remained calm throughout assessment and has answered questions appropriately.    At this time Justin [name]  is educated and verbalizes understanding of mental health resources and other crisis services in the community. He is instructed to call 911 and present to the nearest emergency room should he experience any suicidal/homicidal ideation, auditory/visual/hallucinations, or detrimental worsening of his mental health condition. He was a also advised by Probation officer that he could call the toll-free phone on insurance card to assist with identifying in network counselors and agencies or number on back of Medicaid card to speak with care coordinator.    Psychiatric Specialty Exam  Presentation  General Appearance:Appropriate for Environment; Casual  Eye Contact:Good  Speech:Clear and Coherent  Speech Volume:Normal  Handedness:Right   Mood and Affect  Mood:Euthymic  Affect:Appropriate; Congruent   Thought Process  Thought Processes:Coherent; Goal Directed  Descriptions of Associations:Intact  Orientation:Full (Time, Place and Person)  Thought Content:Logical  Diagnosis of Schizophrenia or Schizoaffective disorder in past: Yes   Hallucinations:None voices that tells him he is flirty, something is going to happen to his family  Ideas of Reference:None  Suicidal Thoughts:No With Plan  Homicidal Thoughts:No Without Plan   Sensorium  Memory:Immediate Good; Recent Good; Remote Good  Judgment:Fair  Insight:Fair   Executive Functions  Concentration:Good  Attention Span:Good  Banks of Knowledge:Good  Language:Good   Psychomotor Activity  Psychomotor Activity:Normal   Assets  Assets:Communication Skills; Desire for Improvement; Housing; Physical Health   Sleep  Sleep:Fair  Number of hours: 0 (reports no sleep for x3 days)   No data recorded  Physical Exam: Physical Exam Vitals reviewed.  HENT:     Head: Normocephalic.  Cardiovascular:     Rate and Rhythm: Normal rate and regular rhythm.  Pulmonary:     Effort:  Pulmonary effort is normal.   Neurological:     Mental Status: He is oriented to person, place, and time.  Psychiatric:        Attention and Perception: Attention normal.        Mood and Affect: Mood normal.        Speech: Speech normal.        Behavior: Behavior normal.        Thought Content: Thought content normal.        Cognition and Memory: Cognition normal.        Judgment: Judgment normal.   Review of Systems  HENT: Negative.    Eyes: Negative.   Skin: Negative.   Neurological: Negative.   Endo/Heme/Allergies: Negative.   Psychiatric/Behavioral:  Positive for depression and substance abuse. The patient is nervous/anxious.   All other systems reviewed and are negative. Blood pressure 119/77, pulse 80, temperature 98.3 F (36.8 C), temperature source Oral, resp. rate 18, SpO2 100 %. There is no height or weight on file to calculate BMI.  Musculoskeletal: Strength & Muscle Tone: within normal limits Gait & Station: normal Patient leans: N/A   [facility] MSE Discharge Disposition for Follow up and Recommendations: Based on my evaluation the patient does not appear to have an emergency medical condition and can be discharged with resources and follow up care in outpatient services for [facility] in [place] and or [place], NP 06/21/2021, 4:35 PM

## 2021-06-21 NOTE — Discharge Instructions (Signed)
Take all medications as prescribed. Keep all follow-up appointments as scheduled.  Do not consume alcohol or use illegal drugs while on prescription medications. Report any adverse effects from your medications to your primary care provider promptly.  In the event of recurrent symptoms or worsening symptoms, call 911, a crisis hotline, or go to the nearest emergency department for evaluation.   

## 2021-06-21 NOTE — Progress Notes (Signed)
Pt received AVS, pt sts understanding, w/o incident.

## 2021-06-23 ENCOUNTER — Other Ambulatory Visit: Payer: Self-pay | Admitting: Internal Medicine

## 2021-06-23 DIAGNOSIS — B20 Human immunodeficiency virus [HIV] disease: Secondary | ICD-10-CM

## 2021-06-23 NOTE — Telephone Encounter (Signed)
Attempted to call patient - no VM box set up to leave a message.

## 2021-06-30 ENCOUNTER — Ambulatory Visit (INDEPENDENT_AMBULATORY_CARE_PROVIDER_SITE_OTHER): Payer: Self-pay | Admitting: Internal Medicine

## 2021-06-30 ENCOUNTER — Other Ambulatory Visit: Payer: Self-pay

## 2021-06-30 ENCOUNTER — Encounter: Payer: Self-pay | Admitting: Internal Medicine

## 2021-06-30 VITALS — BP 138/87 | HR 84 | Temp 98.4°F | Wt 210.6 lb

## 2021-06-30 DIAGNOSIS — F1999 Other psychoactive substance use, unspecified with unspecified psychoactive substance-induced disorder: Secondary | ICD-10-CM

## 2021-06-30 DIAGNOSIS — B20 Human immunodeficiency virus [HIV] disease: Secondary | ICD-10-CM

## 2021-06-30 DIAGNOSIS — R569 Unspecified convulsions: Secondary | ICD-10-CM

## 2021-06-30 MED ORDER — BIKTARVY 50-200-25 MG PO TABS: 1.0000 | ORAL_TABLET | Freq: Every day | ORAL | 11 refills | Status: DC

## 2021-06-30 MED ORDER — LEVETIRACETAM 500 MG PO TABS: 1000.0000 mg | ORAL_TABLET | Freq: Two times a day (BID) | ORAL | 3 refills | Status: DC

## 2021-06-30 NOTE — Progress Notes (Signed)
° °  Subjective:    Patient ID: Justin [name], male    DOB: 05-Aug-1980, 40 y.o.   MRN: [id number]  HPI Here for follow up of HIV after multiple missed visits. He has had continued issues with substance abuse and resultant homelessness.  I last saw him in August 2021.  He comes in now drug free and wanting to get back into regular care.  He has completed a 30 day program and about to enter a 90 day residencial program.  He feels better than he has in a long while and has gained weight and maintained sobriety during this time.  He relates some of the events that transpired while on drugs and never wants to return to that again.  He has good support system with some family members and friends.     Review of Systems  Constitutional:  Negative for fatigue.  Gastrointestinal:  Negative for diarrhea and nausea.  Skin:  Negative for rash.      Objective:   Physical Exam Eyes:     General: No scleral icterus. Cardiovascular:     Rate and Rhythm: Normal rate and regular rhythm.     Heart sounds: No murmur heard. Pulmonary:     Effort: Pulmonary effort is normal.  Skin:    Findings: No rash.  Neurological:     General: No focal deficit present.     Mental Status: He is alert.  Psychiatric:        Mood and Affect: Mood normal.    SH: now drug-free      Assessment & Plan:

## 2021-07-01 ENCOUNTER — Encounter: Payer: Self-pay | Admitting: Internal Medicine

## 2021-07-01 NOTE — Assessment & Plan Note (Signed)
He seems to be doing well and I encouraged him to continue down this path of remaining drug-free and healthy.

## 2021-07-01 NOTE — Assessment & Plan Note (Signed)
I have refilled his Keppra that he has been on and will continue to fill until he can get into primary care.

## 2021-07-01 NOTE — Assessment & Plan Note (Signed)
He has been undetectable recently despite all of his issues and has remained on Bitkarvy. Will check his labs today and have him scheduled back after his rehab time.

## 2021-07-02 ENCOUNTER — Telehealth (HOSPITAL_COMMUNITY): Payer: Self-pay

## 2021-07-02 LAB — HIV-1 RNA QUANT-NO REFLEX-BLD
HIV 1 RNA Quant: NOT DETECTED Copies/mL
HIV-1 RNA Quant, Log: NOT DETECTED Log cps/mL

## 2021-07-02 LAB — T-HELPER CELL (CD4) - (RCID CLINIC ONLY)
CD4 % Helper T Cell: 30 % — ABNORMAL LOW (ref 33–65)
CD4 T Cell Abs: 595 /uL (ref 400–1790)

## 2021-07-02 NOTE — BH Assessment (Signed)
Care Management - [facility] Follow Up Discharges   Writer attempted to make contact with patient today and was unsuccessful.  Patient voicemail was not set up.   Per chart review, patient was provided outpatient services for [facility] in [place] and or [place].

## 2021-07-20 ENCOUNTER — Encounter (HOSPITAL_BASED_OUTPATIENT_CLINIC_OR_DEPARTMENT_OTHER): Payer: Self-pay

## 2021-07-20 ENCOUNTER — Emergency Department (HOSPITAL_BASED_OUTPATIENT_CLINIC_OR_DEPARTMENT_OTHER)
Admission: EM | Admit: 2021-07-20 | Discharge: 2021-07-20 | Disposition: A | Discharge status: Home / Self Care | Payer: Self-pay | Attending: Emergency Medicine | Admitting: Emergency Medicine

## 2021-07-20 ENCOUNTER — Other Ambulatory Visit: Payer: Self-pay

## 2021-07-20 DIAGNOSIS — K029 Dental caries, unspecified: Secondary | ICD-10-CM | POA: Insufficient documentation

## 2021-07-20 DIAGNOSIS — K0381 Cracked tooth: Secondary | ICD-10-CM | POA: Insufficient documentation

## 2021-07-20 DIAGNOSIS — Z21 Asymptomatic human immunodeficiency virus [HIV] infection status: Secondary | ICD-10-CM | POA: Insufficient documentation

## 2021-07-20 MED ORDER — IBUPROFEN 800 MG PO TABS: 800.0000 mg | ORAL_TABLET | Freq: Three times a day (TID) | ORAL | 0 refills | Status: DC

## 2021-07-20 MED ORDER — IBUPROFEN 800 MG PO TABS
800.0000 mg | ORAL_TABLET | Freq: Once | ORAL | Status: AC
  Administered 2021-07-20: 800 mg via ORAL
  Filled 2021-07-20: qty 1

## 2021-07-20 MED ORDER — CLINDAMYCIN HCL 150 MG PO CAPS: 150.0000 mg | ORAL_CAPSULE | Freq: Four times a day (QID) | ORAL | 0 refills | Status: DC

## 2021-07-20 NOTE — ED Triage Notes (Addendum)
Pt c/o dental pain x today-pt is at [facility] x 1 month for ETOH abuse-NAD-steady gait

## 2021-07-20 NOTE — Discharge Instructions (Signed)
You must get into a dentist office as soon as possible.  You have many cavities and infection in your gums.  This could spread throughout your body if you do not prioritize this.  Pain medication as well as antibiotics have been sent to the pharmacy.  Please get in contact with one of the attached dental offices.

## 2021-07-20 NOTE — ED Provider Notes (Signed)
[facility] EMERGENCY DEPARTMENT Provider Note   CSN: [id number] Arrival date & time: 07/20/21  1615     History  Chief Complaint  Patient presents with   Dental Pain    Justin [name] is a 41 y.o. male With a past medical history of HIV, substance use disorder, hep C and seizure disorder presenting today with complaint of dental pain.  Reports that he is currently residing at [facility] for alcohol use disorder.  Had a seizure last month and broke off his teeth, now is concerned about a gum infection. [facility] told him he must be seen in the emergency department for pain medication.  Dental Pain Associated symptoms: no fever       Home Medications Prior to Admission medications   Medication Sig Start Date End Date Taking? Authorizing Provider  bictegravir-emtricitabine-tenofovir AF (BIKTARVY) 50-200-25 MG TABS tablet Take 1 tablet by mouth daily. 06/30/21   [name], MD  levETIRAcetam (KEPPRA) 500 MG tablet Take 2 tablets (1,000 mg total) by mouth 2 (two) times daily. 06/30/21   Comer, Belia Heman, MD      Allergies    Patient has no known allergies.    Review of Systems   Review of Systems  Constitutional:  Negative for chills and fever.   Physical Exam Updated Vital Signs BP 107/65 (BP Location: Left Arm)    Pulse 85    Temp 97.8 F (36.6 C) (Oral)    Resp 18    Ht 5\' 11"  (1.803 m)    Wt 93 kg    SpO2 99%    BMI 28.59 kg/m  Physical Exam Vitals and nursing note reviewed.  Constitutional:      Appearance: Normal appearance.  HENT:     Head: Normocephalic and atraumatic.     Mouth/Throat:     Comments: All teeth and poor dentition.  Front 2 upper teeth have been broken through to trauma.  Other chipping noted to scattered teeth.  Obvious caries and gingivitis.  No bleeding or noted abscesses Eyes:     General: No scleral icterus.    Conjunctiva/sclera: Conjunctivae normal.  Pulmonary:     Effort: Pulmonary effort is normal. No respiratory distress.   Skin:    Findings: No rash.  Neurological:     Mental Status: He is alert.  Psychiatric:        Mood and Affect: Mood normal.    ED Results / Procedures / Treatments   Labs (all labs ordered are listed, but only abnormal results are displayed) Labs Reviewed - No data to display  EKG None  Radiology No results found.  Procedures Procedures    Medications Ordered in ED Medications - No data to display  ED Course/ Medical Decision Making/ A&P                           Medical Decision Making  41 year old male presenting with dental pain.  Is currently residing at [facility] and they will not give him pain medication unless he is seen.  Due to patient's substance use disorder, he will be discharged with ibuprofen and Clinda.  No narcotics prescribed.  He was given dental resources and reports understanding the importance of following up with a dentist.   Final Clinical Impression(s) / ED Diagnoses Final diagnoses:  Pain due to dental caries    Rx / DC Orders Results and diagnoses were explained to the patient. Return precautions discussed in full.  Patient had no additional questions and expressed complete understanding.   This chart was dictated using voice recognition software.  Despite best efforts to proofread,  errors can occur which can change the documentation meaning.    Woodroe Chen 07/20/21 1724    Benjiman Core, MD 07/20/21 [phone]

## 2021-07-20 NOTE — ED Notes (Signed)
D/c paperwork reviewed with pt, including prescriptions and importance of f/u care. Pt verbalized understanding, no questions or concerns at time of d/c.

## 2021-07-23 ENCOUNTER — Other Ambulatory Visit: Payer: Self-pay

## 2021-07-23 ENCOUNTER — Telehealth: Payer: Self-pay

## 2021-07-23 ENCOUNTER — Emergency Department (HOSPITAL_BASED_OUTPATIENT_CLINIC_OR_DEPARTMENT_OTHER)
Admission: EM | Admit: 2021-07-23 | Discharge: 2021-07-23 | Disposition: A | Discharge status: Home / Self Care | Payer: Self-pay | Attending: Emergency Medicine | Admitting: Emergency Medicine

## 2021-07-23 ENCOUNTER — Encounter (HOSPITAL_BASED_OUTPATIENT_CLINIC_OR_DEPARTMENT_OTHER): Payer: Self-pay | Admitting: *Deleted

## 2021-07-23 DIAGNOSIS — R197 Diarrhea, unspecified: Secondary | ICD-10-CM | POA: Insufficient documentation

## 2021-07-23 LAB — C DIFFICILE QUICK SCREEN W PCR REFLEX
C Diff antigen: NEGATIVE
C Diff interpretation: NOT DETECTED
C Diff toxin: NEGATIVE

## 2021-07-23 MED ORDER — LEVETIRACETAM 500 MG PO TABS: 1000.0000 mg | ORAL_TABLET | Freq: Two times a day (BID) | ORAL | 2 refills | Status: DC

## 2021-07-23 MED ORDER — PENICILLIN V POTASSIUM 250 MG PO TABS: 250.0000 mg | ORAL_TABLET | Freq: Four times a day (QID) | ORAL | 0 refills | Status: DC

## 2021-07-23 NOTE — ED Triage Notes (Signed)
From [facility] with abdominal pain and diarrhea. He was here 3 days ago for a gum infection. He was started on an antibiotic and feels that is the cause for the diarrhea.

## 2021-07-23 NOTE — Discharge Instructions (Addendum)
Do not take anymore the clindamycin.  I have sent a new antibiotic to the pharmacy.  Take this as prescribed, with food and a probiotic.  You may pick up any probiotic in [facility] when you go and get your medicine.  You will be notified if your results for your stool test are positive for any bacteria that needs additional treatment.

## 2021-07-23 NOTE — ED Provider Notes (Signed)
[facility] EMERGENCY DEPARTMENT Provider Note   CSN: [id number] Arrival date & time: 07/23/21  1406     History  Chief Complaint  Patient presents with   Medication Reaction    Justin [name] is a 41 y.o. male diagnosed with a dental infection 3 days ago presenting today with complaint of diarrhea.  He has had 3 days of clindamycin and reports that he is having no formed stools.  Denies abdominal pain, nausea or vomiting.  No fevers or chills.  No blood in diarrhea.  He reports that his mouth is no longer in pain and he feels the antibiotic helped him however his living facility wanted him to be reevaluated.    Home Medications Prior to Admission medications   Medication Sig Start Date End Date Taking? Authorizing Provider  bictegravir-emtricitabine-tenofovir AF (BIKTARVY) 50-200-25 MG TABS tablet Take 1 tablet by mouth daily. 06/30/21   Comer, Belia Heman, MD  clindamycin (CLEOCIN) 150 MG capsule Take 1 capsule (150 mg total) by mouth every 6 (six) hours. 07/20/21   Hesper Venturella A, PA-C  ibuprofen (ADVIL) 800 MG tablet Take 1 tablet (800 mg total) by mouth 3 (three) times daily. 07/20/21   Hali Balgobin A, PA-C  levETIRAcetam (KEPPRA) 500 MG tablet Take 2 tablets (1,000 mg total) by mouth 2 (two) times daily. 06/30/21   Comer, Belia Heman, MD      Allergies    Patient has no known allergies.    Review of Systems   Review of Systems  Constitutional:  Negative for chills and fever.  Gastrointestinal:  Positive for abdominal pain and diarrhea. Negative for blood in stool, constipation, nausea and vomiting.   Physical Exam Updated Vital Signs BP 123/84 (BP Location: Right Arm)    Pulse 85    Temp 97.8 F (36.6 C) (Oral)    Resp 18    Ht 5\' 11"  (1.803 m)    Wt 93 kg    SpO2 96%    BMI 28.60 kg/m  Physical Exam Vitals and nursing note reviewed.  Constitutional:      Appearance: Normal appearance.  HENT:     Head: Normocephalic and atraumatic.  Eyes:     General:  No scleral icterus.    Conjunctiva/sclera: Conjunctivae normal.  Pulmonary:     Effort: Pulmonary effort is normal. No respiratory distress.  Abdominal:     General: Abdomen is flat.     Palpations: Abdomen is soft.     Tenderness: There is no abdominal tenderness.  Skin:    Findings: No rash.  Neurological:     Mental Status: He is alert.  Psychiatric:        Mood and Affect: Mood normal.    ED Results / Procedures / Treatments   Labs (all labs ordered are listed, but only abnormal results are displayed) Labs Reviewed  C DIFFICILE QUICK SCREEN W PCR REFLEX      EKG None  Radiology No results found.  Procedures Procedures    Medications Ordered in ED Medications - No data to display  ED Course/ Medical Decision Making/ A&P                           Medical Decision Making Risk Prescription drug management.   41 year old male on third day of clindamycin.  Complaining of diarrhea.  Concern for C. difficile.  Stool sample ordered and will not come back today.  Patient will be notified if it  is positive for any C. Difficile and treated accordingly.   Final Clinical Impression(s) / ED Diagnoses Final diagnoses:  Diarrhea, unspecified type    Rx / DC Orders Results and diagnoses were explained to the patient. Return precautions discussed in full. Patient had no additional questions and expressed complete understanding.   This chart was dictated using voice recognition software.  Despite best efforts to proofread,  errors can occur which can change the documentation meaning.    Woodroe Chen 07/23/21 1700    [name], MD 07/23/21 1942

## 2021-07-23 NOTE — Telephone Encounter (Signed)
Justin [name] at [facility] called requesting a refill on Keppra for patient. RX was written incorrectly at 2 tablet BID quantity of 60 + 3 refills. Okay to send in additional refills?   Call back - [phone] ext [phone]   Justin [name] P Min Tunnell, CMA

## 2021-07-26 ENCOUNTER — Encounter (HOSPITAL_BASED_OUTPATIENT_CLINIC_OR_DEPARTMENT_OTHER): Payer: Self-pay | Admitting: Urology

## 2021-07-26 ENCOUNTER — Other Ambulatory Visit: Payer: Self-pay

## 2021-07-26 ENCOUNTER — Emergency Department (HOSPITAL_BASED_OUTPATIENT_CLINIC_OR_DEPARTMENT_OTHER)
Admission: EM | Admit: 2021-07-26 | Discharge: 2021-07-26 | Disposition: A | Discharge status: Home / Self Care | Payer: Self-pay | Attending: Emergency Medicine | Admitting: Emergency Medicine

## 2021-07-26 DIAGNOSIS — E860 Dehydration: Secondary | ICD-10-CM | POA: Insufficient documentation

## 2021-07-26 DIAGNOSIS — K029 Dental caries, unspecified: Secondary | ICD-10-CM | POA: Insufficient documentation

## 2021-07-26 DIAGNOSIS — R109 Unspecified abdominal pain: Secondary | ICD-10-CM | POA: Insufficient documentation

## 2021-07-26 DIAGNOSIS — Z21 Asymptomatic human immunodeficiency virus [HIV] infection status: Secondary | ICD-10-CM | POA: Insufficient documentation

## 2021-07-26 DIAGNOSIS — R197 Diarrhea, unspecified: Secondary | ICD-10-CM | POA: Insufficient documentation

## 2021-07-26 LAB — COMPREHENSIVE METABOLIC PANEL
ALT: 23 U/L (ref 0–44)
AST: 21 U/L (ref 15–41)
Albumin: 4.2 g/dL (ref 3.5–5.0)
Alkaline Phosphatase: 96 U/L (ref 38–126)
Anion gap: 8 (ref 5–15)
BUN: 13 mg/dL (ref 6–20)
CO2: 24 mmol/L (ref 22–32)
Calcium: 9.2 mg/dL (ref 8.9–10.3)
Chloride: 105 mmol/L (ref 98–111)
Creatinine, Ser: 1.02 mg/dL (ref 0.61–1.24)
GFR, Estimated: >60 mL/min (ref >60)
Glucose, Bld: 93 mg/dL (ref 70–99)
Potassium: 4.1 mmol/L (ref 3.5–5.1)
Sodium: 137 mmol/L (ref 135–145)
Total Bilirubin: 1.2 mg/dL (ref 0.3–1.2)
Total Protein: 7.2 g/dL (ref 6.5–8.1)

## 2021-07-26 LAB — CBC WITH DIFFERENTIAL/PLATELET
Abs Immature Granulocytes: 0.01 10*3/uL (ref 0.00–0.07)
Basophils Absolute: 0 10*3/uL (ref 0.0–0.1)
Basophils Relative: 0 %
Eosinophils Absolute: 0.1 10*3/uL (ref 0.0–0.5)
Eosinophils Relative: 1 %
HCT: 46.9 % (ref 39.0–52.0)
Hemoglobin: 15.8 g/dL (ref 13.0–17.0)
Immature Granulocytes: 0 %
Lymphocytes Relative: 46 %
Lymphs Abs: 2.5 10*3/uL (ref 0.7–4.0)
MCH: 32.2 pg (ref 26.0–34.0)
MCHC: 33.7 g/dL (ref 30.0–36.0)
MCV: 95.7 fL (ref 80.0–100.0)
Monocytes Absolute: 0.4 10*3/uL (ref 0.1–1.0)
Monocytes Relative: 8 %
Neutro Abs: 2.5 10*3/uL (ref 1.7–7.7)
Neutrophils Relative %: 45 %
Platelets: 201 10*3/uL (ref 150–400)
RBC: 4.9 MIL/uL (ref 4.22–5.81)
RDW: 12.3 % (ref 11.5–15.5)
WBC: 5.5 10*3/uL (ref 4.0–10.5)
nRBC: 0 % (ref 0.0–0.2)

## 2021-07-26 MED ORDER — LOPERAMIDE HCL 2 MG PO CAPS: 2.0000 mg | ORAL_CAPSULE | Freq: Four times a day (QID) | ORAL | 0 refills | Status: DC | PRN

## 2021-07-26 MED ORDER — DICYCLOMINE HCL 20 MG PO TABS: 20.0000 mg | ORAL_TABLET | Freq: Two times a day (BID) | ORAL | 0 refills | Status: DC

## 2021-07-26 NOTE — ED Provider Notes (Signed)
[facility] EMERGENCY DEPARTMENT Provider Note   CSN: [id number] Arrival date & time: 07/26/21  1504     History  Chief Complaint  Patient presents with   Diarrhea    Justin [name] is a 41 y.o. male.  The history is provided by the patient.  Diarrhea Quality:  Watery Severity:  Mild Onset quality:  Gradual Duration: about a week. Timing:  Intermittent Progression:  Unchanged Relieved by:  Nothing Worsened by:  Nothing Associated symptoms: abdominal pain (cramping)   Associated symptoms: no arthralgias, no chills, no recent cough, no diaphoresis, no fever, no headaches, no URI and no vomiting   Risk factors: recent antibiotic use       Home Medications Prior to Admission medications   Medication Sig Start Date End Date Taking? Authorizing Provider  dicyclomine (BENTYL) 20 MG tablet Take 1 tablet (20 mg total) by mouth 2 (two) times daily. 07/26/21  Yes Mickie Kozikowski, DO  loperamide (IMODIUM) 2 MG capsule Take 1 capsule (2 mg total) by mouth 4 (four) times daily as needed for diarrhea or loose stools. 07/26/21  Yes Yolanda Dockendorf, DO  bictegravir-emtricitabine-tenofovir AF (BIKTARVY) 50-200-25 MG TABS tablet Take 1 tablet by mouth daily. 06/30/21   Comer, Belia Heman, MD  clindamycin (CLEOCIN) 150 MG capsule Take 1 capsule (150 mg total) by mouth every 6 (six) hours. 07/20/21   Redwine, Madison A, PA-C  ibuprofen (ADVIL) 800 MG tablet Take 1 tablet (800 mg total) by mouth 3 (three) times daily. 07/20/21   Redwine, Madison A, PA-C  levETIRAcetam (KEPPRA) 500 MG tablet Take 2 tablets (1,000 mg total) by mouth 2 (two) times daily. 07/23/21   Comer, Belia Heman, MD      Allergies    Patient has no known allergies.    Review of Systems   Review of Systems  Constitutional:  Negative for chills, diaphoresis and fever.  Gastrointestinal:  Positive for abdominal pain (cramping) and diarrhea. Negative for vomiting.  Musculoskeletal:  Negative for arthralgias.   Neurological:  Negative for headaches.   Physical Exam Updated Vital Signs BP (!) 127/95 (BP Location: Left Arm)    Pulse 69    Temp 98 F (36.7 C) (Oral)    Resp 18    Ht 5\' 11"  (1.803 m)    Wt 93 kg    SpO2 100%    BMI 28.60 kg/m  Physical Exam Vitals and nursing note reviewed.  Constitutional:      General: He is not in acute distress.    Appearance: He is well-developed.  HENT:     Head: Normocephalic and atraumatic.     Nose: Nose normal.     Mouth/Throat:     Mouth: Mucous membranes are moist.  Eyes:     Extraocular Movements: Extraocular movements intact.     Conjunctiva/sclera: Conjunctivae normal.     Pupils: Pupils are equal, round, and reactive to light.  Cardiovascular:     Rate and Rhythm: Normal rate and regular rhythm.     Pulses: Normal pulses.     Heart sounds: Normal heart sounds. No murmur heard. Pulmonary:     Effort: Pulmonary effort is normal. No respiratory distress.     Breath sounds: Normal breath sounds.  Abdominal:     General: Abdomen is flat. There is no distension.     Palpations: Abdomen is soft.     Tenderness: There is no abdominal tenderness.  Musculoskeletal:        General: No swelling.  Cervical back: Neck supple.  Skin:    General: Skin is warm and dry.     Capillary Refill: Capillary refill takes less than 2 seconds.  Neurological:     General: No focal deficit present.     Mental Status: He is alert.  Psychiatric:        Mood and Affect: Mood normal.    ED Results / Procedures / Treatments   Labs (all labs ordered are listed, but only abnormal results are displayed) Labs Reviewed  CBC WITH DIFFERENTIAL/PLATELET  COMPREHENSIVE METABOLIC PANEL    EKG None  Radiology No results found.  Procedures Procedures    Medications Ordered in ED Medications - No data to display  ED Course/ Medical Decision Making/ A&P                           Medical Decision Making Amount and/or Complexity of Data Reviewed Labs:  ordered.  Risk Prescription drug management.   Justin [name] is here with diarrhea.  History of HIV.  Recently started on penicillin/clindamycin for presumed dental infection.  Normal vitals.  No fever.  Presents the ED with ongoing diarrhea.  Seen here several days ago with the same and per chart review he was switched from clindamycin to penicillin as concern for antibiotic as cause of his symptoms.  He had a C. difficile test that was negative.  He still having some loose stools but currently no nausea or vomiting.  No fever.  No signs of ongoing dental infection on exam.  Has chronic dental caries on exam.  He has no abdominal tenderness but does have severe cramping at times.  Differential diagnosis includes medication side effect versus dehydration versus electrolyte abnormality.  We will obtain a CBC and CMP to evaluate for dehydration.  CBC and CMP have been reviewed and interpreted by myself.  These show no significant findings.  No anemia.  No significant electrolyte abnormality or kidney injury.  Overall suspect his symptoms are secondary to antibiotic use.  Recent HIV viral load have been undetectable.  No concern for other infectious process.  He is very well-appearing.  Will prescribe Imodium and Bentyl.  No need for any further work-up at this time.  Discharged in good condition.  Referred to dentistry for further work-up.  This chart was dictated using voice recognition software.  Despite best efforts to proofread,  errors can occur which can change the documentation meaning.          Final Clinical Impression(s) / ED Diagnoses Final diagnoses:  Diarrhea, unspecified type    Rx / DC Orders ED Discharge Orders          Ordered    dicyclomine (BENTYL) 20 MG tablet  2 times daily        07/26/21 1627    loperamide (IMODIUM) 2 MG capsule  4 times daily PRN        07/26/21 1627              [name], DO 07/26/21 1629

## 2021-07-26 NOTE — Discharge Instructions (Signed)
Please stop antibiotic.  Suspect that this is the cause of your diarrhea.  Your C. difficile test is negative.  Use Imodium and Bentyl as prescribed to help with your symptoms.

## 2021-07-26 NOTE — ED Triage Notes (Signed)
Pt states continued diarrhea since last visit Currently taking probiotic and penicillin  States generalized abdominal pain  States bright red blood on paper with wiping but not in stool

## 2021-07-28 ENCOUNTER — Other Ambulatory Visit: Payer: Self-pay | Admitting: Internal Medicine

## 2021-07-28 MED ORDER — LEVETIRACETAM 500 MG PO TABS: 1000.0000 mg | ORAL_TABLET | Freq: Two times a day (BID) | ORAL | 5 refills | Status: DC

## 2021-07-30 ENCOUNTER — Other Ambulatory Visit: Payer: Self-pay

## 2021-07-30 ENCOUNTER — Emergency Department (HOSPITAL_COMMUNITY)
Admission: EM | Admit: 2021-07-30 | Discharge: 2021-07-31 | Disposition: A | Discharge status: Home / Self Care | Payer: Self-pay | Attending: Emergency Medicine | Admitting: Emergency Medicine

## 2021-07-30 ENCOUNTER — Encounter (HOSPITAL_COMMUNITY): Payer: Self-pay

## 2021-07-30 ENCOUNTER — Encounter (HOSPITAL_BASED_OUTPATIENT_CLINIC_OR_DEPARTMENT_OTHER): Payer: Self-pay

## 2021-07-30 ENCOUNTER — Emergency Department (HOSPITAL_BASED_OUTPATIENT_CLINIC_OR_DEPARTMENT_OTHER)
Admission: EM | Admit: 2021-07-30 | Discharge: 2021-07-30 | Disposition: A | Discharge status: Home / Self Care | Payer: Self-pay | Attending: Emergency Medicine | Admitting: Emergency Medicine

## 2021-07-30 DIAGNOSIS — S01112A Laceration without foreign body of left eyelid and periocular area, initial encounter: Secondary | ICD-10-CM | POA: Insufficient documentation

## 2021-07-30 DIAGNOSIS — Z23 Encounter for immunization: Secondary | ICD-10-CM | POA: Insufficient documentation

## 2021-07-30 DIAGNOSIS — Z21 Asymptomatic human immunodeficiency virus [HIV] infection status: Secondary | ICD-10-CM | POA: Insufficient documentation

## 2021-07-30 DIAGNOSIS — G40909 Epilepsy, unspecified, not intractable, without status epilepticus: Secondary | ICD-10-CM | POA: Insufficient documentation

## 2021-07-30 DIAGNOSIS — R569 Unspecified convulsions: Secondary | ICD-10-CM | POA: Insufficient documentation

## 2021-07-30 DIAGNOSIS — Z79899 Other long term (current) drug therapy: Secondary | ICD-10-CM | POA: Insufficient documentation

## 2021-07-30 DIAGNOSIS — X58XXXA Exposure to other specified factors, initial encounter: Secondary | ICD-10-CM | POA: Insufficient documentation

## 2021-07-30 DIAGNOSIS — S0990XA Unspecified injury of head, initial encounter: Secondary | ICD-10-CM | POA: Insufficient documentation

## 2021-07-30 DIAGNOSIS — K0530 Chronic periodontitis, unspecified: Secondary | ICD-10-CM | POA: Insufficient documentation

## 2021-07-30 LAB — CBC WITH DIFFERENTIAL/PLATELET
Abs Immature Granulocytes: 0.03 10*3/uL (ref 0.00–0.07)
Basophils Absolute: 0 10*3/uL (ref 0.0–0.1)
Basophils Relative: 0 %
Eosinophils Absolute: 0.1 10*3/uL (ref 0.0–0.5)
Eosinophils Relative: 1 %
HCT: 47.7 % (ref 39.0–52.0)
Hemoglobin: 16 g/dL (ref 13.0–17.0)
Immature Granulocytes: 0 %
Lymphocytes Relative: 27 %
Lymphs Abs: 1.8 10*3/uL (ref 0.7–4.0)
MCH: 32.2 pg (ref 26.0–34.0)
MCHC: 33.5 g/dL (ref 30.0–36.0)
MCV: 96 fL (ref 80.0–100.0)
Monocytes Absolute: 0.7 10*3/uL (ref 0.1–1.0)
Monocytes Relative: 10 %
Neutro Abs: 4.3 10*3/uL (ref 1.7–7.7)
Neutrophils Relative %: 62 %
Platelets: 192 10*3/uL (ref 150–400)
RBC: 4.97 MIL/uL (ref 4.22–5.81)
RDW: 12.3 % (ref 11.5–15.5)
WBC: 6.9 10*3/uL (ref 4.0–10.5)
nRBC: 0 % (ref 0.0–0.2)

## 2021-07-30 MED ORDER — LIDOCAINE-EPINEPHRINE (PF) 2 %-1:200000 IJ SOLN
10.0000 mL | Freq: Once | INTRAMUSCULAR | Status: AC
  Administered 2021-07-30: 10 mL
  Filled 2021-07-30: qty 20

## 2021-07-30 MED ORDER — TETANUS-DIPHTH-ACELL PERTUSSIS 5-2.5-18.5 LF-MCG/0.5 IM SUSY
0.5000 mL | PREFILLED_SYRINGE | Freq: Once | INTRAMUSCULAR | Status: AC
Start: 2021-07-30 — End: 2021-07-30
  Administered 2021-07-30: 0.5 mL via INTRAMUSCULAR
  Filled 2021-07-30: qty 0.5

## 2021-07-30 MED ORDER — LEVETIRACETAM IN NACL 1500 MG/100ML IV SOLN
1500.0000 mg | Freq: Once | INTRAVENOUS | Status: AC
  Administered 2021-07-31: 1500 mg via INTRAVENOUS
  Filled 2021-07-30: qty 100

## 2021-07-30 MED ORDER — PENICILLIN V POTASSIUM 500 MG PO TABS: 500.0000 mg | ORAL_TABLET | Freq: Three times a day (TID) | ORAL | 0 refills | Status: DC

## 2021-07-30 NOTE — Discharge Instructions (Addendum)
Your stiches should dissolve on their own in the next 5-7 days. If they have not fallen out after a week please return for removal. Continue your antibiotics and follow up with your dentist as scheduled.   Follow up with Neurology for evaluation of your suspected seizure. Continue your medications and return to the ER for any further seizure episodes. You are not allowed to drive for 6 months or until cleared by Neurology.

## 2021-07-30 NOTE — ED Triage Notes (Signed)
Patient BIB [facility] from [facility]. Had a seizure this morning, got some stitches on his eyebrow. Then got discharged, sent home and had another seizure.

## 2021-07-30 NOTE — ED Triage Notes (Addendum)
Pt states he woke ~2pm for med admn at [facility] with lac to left eyebrow area and "a pool of blood beside my bed"-his last recall was lunch then lying down in bed-also c/o bumps on his tongue-NAD-to triage in w/c

## 2021-07-30 NOTE — ED Provider Notes (Signed)
[facility] EMERGENCY DEPARTMENT Provider Note    CSN: [id number] Arrival date & time: 07/30/21 1457  History Chief Complaint  Patient presents with   Head Laceration    Justin [name] is a 41 y.o. male with history of HIV and seizures (on Keppra) has been at [facility] for about 40 days for substance abuse treatment. He had gone back to his room for a nap after lunch today and when he woke up he was in the bed with a pool of blood on the floor. He noticed a laceration on his L eyelid but unsure what happened. He had also bitten his tongue so he suspects he had a seizure although this was not witnessed. He has been compliant with his medications recently. Has been sleeping well. He denies any acute complaints now, but has had issues with his teeth prompting ED visit a few weeks ago. HE is scheduled to see a dentist soon. He is unsure last TDAP.    Home Medications Prior to Admission medications   Medication Sig Start Date End Date Taking? Authorizing Provider  penicillin v potassium (VEETID) 500 MG tablet Take 1 tablet (500 mg total) by mouth 3 (three) times daily. 07/30/21  Yes Pollyann Savoy, MD  bictegravir-emtricitabine-tenofovir AF (BIKTARVY) 50-200-25 MG TABS tablet Take 1 tablet by mouth daily. 06/30/21   Comer, Belia Heman, MD  dicyclomine (BENTYL) 20 MG tablet Take 1 tablet (20 mg total) by mouth 2 (two) times daily. 07/26/21   Curatolo, Adam, DO  ibuprofen (ADVIL) 800 MG tablet Take 1 tablet (800 mg total) by mouth 3 (three) times daily. 07/20/21   Redwine, Madison A, PA-C  levETIRAcetam (KEPPRA) 500 MG tablet Take 2 tablets (1,000 mg total) by mouth 2 (two) times daily. 07/28/21   Comer, Belia Heman, MD  loperamide (IMODIUM) 2 MG capsule Take 1 capsule (2 mg total) by mouth 4 (four) times daily as needed for diarrhea or loose stools. 07/26/21   [name], DO     Allergies    Patient has no known allergies.   Review of Systems   Review of Systems Please see HPI  for pertinent positives and negatives  Physical Exam BP 110/86 (BP Location: Right Arm)    Pulse 74    Temp 97.8 F (36.6 C) (Oral)    Resp 18    SpO2 99%   Physical Exam Vitals and nursing note reviewed.  Constitutional:      Appearance: Normal appearance.  HENT:     Head: Normocephalic.     Comments: 2.5cm linear laceration L upper eyelid/eyebrow    Nose: Nose normal.     Mouth/Throat:     Mouth: Mucous membranes are moist.     Comments: Contusion L tongue; moderate pericoronitis R lower molar Eyes:     Extraocular Movements: Extraocular movements intact.     Conjunctiva/sclera: Conjunctivae normal.  Cardiovascular:     Rate and Rhythm: Normal rate.  Pulmonary:     Effort: Pulmonary effort is normal.     Breath sounds: Normal breath sounds.  Abdominal:     General: Abdomen is flat.     Palpations: Abdomen is soft.     Tenderness: There is no abdominal tenderness.  Musculoskeletal:        General: No swelling. Normal range of motion.     Cervical back: Neck supple. Tenderness (cervical paraspinal muscles, no midline tenderness) present.  Skin:    General: Skin is warm and dry.  Neurological:  General: No focal deficit present.     Mental Status: He is alert and oriented to person, place, and time.     Cranial Nerves: No cranial nerve deficit.     Sensory: No sensory deficit.     Motor: No weakness.     Gait: Gait normal.  Psychiatric:        Mood and Affect: Mood normal.    ED Results / Procedures / Treatments   EKG None  Procedures .Marland KitchenLaceration Repair  Date/Time: 07/30/2021 4:43 PM Performed by: Pollyann Savoy, MD Authorized by: Pollyann Savoy, MD   Consent:    Consent obtained:  Verbal   Consent given by:  Patient Anesthesia:    Anesthesia method:  Local infiltration   Local anesthetic:  Lidocaine 1% WITH epi Laceration details:    Location:  Face   Face location:  L eyebrow   Length (cm):  2.5 Pre-procedure details:    Preparation:   Patient was prepped and draped in usual sterile fashion Exploration:    Hemostasis achieved with:  Epinephrine Treatment:    Area cleansed with:  Povidone-iodine   Irrigation solution:  Sterile saline Skin repair:    Repair method:  Sutures   Suture size:  5-0   Suture material:  Fast-absorbing gut   Number of sutures:  4 Approximation:    Approximation:  Close Repair type:    Repair type:  Simple Post-procedure details:    Dressing:  Open (no dressing)   Procedure completion:  Tolerated well, no immediate complications  Medications Ordered in the ED Medications  lidocaine-EPINEPHrine (XYLOCAINE W/EPI) 2 %-1:200000 (PF) injection 10 mL (has no administration in time range)  Tdap (BOOSTRIX) injection 0.5 mL (has no administration in time range)    Initial Impression and Plan  Patient here with unwitnessed head injury, suspect he had a seizure while at [facility] but other than his eyebrow laceration he has no other acute complaints and is back to baseline mental status. He is still complaining of dental pain and has a mild-moderate pericoronitis. Advised to complete his PCN (Abx were stopped due to diarrhea earlier this week) and follow up with Dentist as directed. Wound is repaired. No further ED workup needed. Patient knows not to drive. Continue Keppra and follow up with Neurology. Will not make medication adjustments for one breakthrough seizure at this point.   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): chronic illness or injury with exacerbation, progression, or side effects of treatment that poses a threat to life or bodily functions  Risk Prescription drug management. Decision regarding hospitalization.    Final Clinical Impression(s) / ED  Diagnoses Final diagnoses:  Eyebrow laceration, left, initial encounter  Seizure [facility])  Pericoronitis    Rx / DC Orders ED Discharge Orders          Ordered    penicillin v potassium (VEETID) 500 MG tablet  3 times daily        07/30/21 1653             Pollyann Savoy, MD 07/30/21 1655

## 2021-07-30 NOTE — ED Notes (Addendum)
Pt was found to be laying on the siderail of the bed. He is unsure why, but was incontinent of stool and his O2 dropped momentarily (see O2 at 2339). Provider made aware of potential seizure activity.

## 2021-07-31 ENCOUNTER — Observation Stay (HOSPITAL_COMMUNITY)
Admission: EM | Admit: 2021-07-31 | Discharge: 2021-08-01 | Payer: Self-pay | Attending: Internal Medicine | Admitting: Internal Medicine

## 2021-07-31 ENCOUNTER — Observation Stay (HOSPITAL_COMMUNITY): Payer: Self-pay

## 2021-07-31 ENCOUNTER — Other Ambulatory Visit: Payer: Self-pay

## 2021-07-31 ENCOUNTER — Emergency Department (HOSPITAL_COMMUNITY): Payer: Self-pay

## 2021-07-31 ENCOUNTER — Encounter (HOSPITAL_COMMUNITY): Payer: Self-pay | Admitting: Emergency Medicine

## 2021-07-31 DIAGNOSIS — G40909 Epilepsy, unspecified, not intractable, without status epilepticus: Principal | ICD-10-CM | POA: Insufficient documentation

## 2021-07-31 DIAGNOSIS — Z21 Asymptomatic human immunodeficiency virus [HIV] infection status: Secondary | ICD-10-CM | POA: Insufficient documentation

## 2021-07-31 DIAGNOSIS — B182 Chronic viral hepatitis C: Secondary | ICD-10-CM | POA: Diagnosis present

## 2021-07-31 DIAGNOSIS — Z20822 Contact with and (suspected) exposure to covid-19: Secondary | ICD-10-CM | POA: Insufficient documentation

## 2021-07-31 DIAGNOSIS — S71112D Laceration without foreign body, left thigh, subsequent encounter: Secondary | ICD-10-CM | POA: Insufficient documentation

## 2021-07-31 DIAGNOSIS — X58XXXD Exposure to other specified factors, subsequent encounter: Secondary | ICD-10-CM | POA: Insufficient documentation

## 2021-07-31 DIAGNOSIS — B20 Human immunodeficiency virus [HIV] disease: Secondary | ICD-10-CM | POA: Diagnosis present

## 2021-07-31 DIAGNOSIS — Z87891 Personal history of nicotine dependence: Secondary | ICD-10-CM | POA: Insufficient documentation

## 2021-07-31 DIAGNOSIS — Z79899 Other long term (current) drug therapy: Secondary | ICD-10-CM | POA: Insufficient documentation

## 2021-07-31 DIAGNOSIS — F1910 Other psychoactive substance abuse, uncomplicated: Secondary | ICD-10-CM | POA: Diagnosis present

## 2021-07-31 DIAGNOSIS — R569 Unspecified convulsions: Secondary | ICD-10-CM

## 2021-07-31 DIAGNOSIS — S01112D Laceration without foreign body of left eyelid and periocular area, subsequent encounter: Secondary | ICD-10-CM | POA: Insufficient documentation

## 2021-07-31 LAB — CBC WITH DIFFERENTIAL/PLATELET
Abs Immature Granulocytes: 0.02 10*3/uL (ref 0.00–0.07)
Abs Immature Granulocytes: 0.01 10*3/uL (ref 0.00–0.07)
Basophils Absolute: 0 10*3/uL (ref 0.0–0.1)
Basophils Absolute: 0 10*3/uL (ref 0.0–0.1)
Basophils Relative: 0 %
Basophils Relative: 0 %
Eosinophils Absolute: 0 10*3/uL (ref 0.0–0.5)
Eosinophils Absolute: 0 10*3/uL (ref 0.0–0.5)
Eosinophils Relative: 0 %
Eosinophils Relative: 0 %
HCT: 45.7 % (ref 39.0–52.0)
HCT: 49.4 % (ref 39.0–52.0)
Hemoglobin: 15.3 g/dL (ref 13.0–17.0)
Hemoglobin: 16.5 g/dL (ref 13.0–17.0)
Immature Granulocytes: 0 %
Immature Granulocytes: 0 %
Lymphocytes Relative: 17 %
Lymphocytes Relative: 18 %
Lymphs Abs: 1.1 10*3/uL (ref 0.7–4.0)
Lymphs Abs: 1.3 10*3/uL (ref 0.7–4.0)
MCH: 32.9 pg (ref 26.0–34.0)
MCH: 32.2 pg (ref 26.0–34.0)
MCHC: 33.5 g/dL (ref 30.0–36.0)
MCHC: 33.4 g/dL (ref 30.0–36.0)
MCV: 98.3 fL (ref 80.0–100.0)
MCV: 96.5 fL (ref 80.0–100.0)
Monocytes Absolute: 0.5 10*3/uL (ref 0.1–1.0)
Monocytes Absolute: 0.5 10*3/uL (ref 0.1–1.0)
Monocytes Relative: 8 %
Monocytes Relative: 7 %
Neutro Abs: 4.9 10*3/uL (ref 1.7–7.7)
Neutro Abs: 5.4 10*3/uL (ref 1.7–7.7)
Neutrophils Relative %: 75 %
Neutrophils Relative %: 75 %
Platelets: 150 10*3/uL (ref 150–400)
Platelets: 184 10*3/uL (ref 150–400)
RBC: 4.65 MIL/uL (ref 4.22–5.81)
RBC: 5.12 MIL/uL (ref 4.22–5.81)
RDW: 12.3 % (ref 11.5–15.5)
RDW: 11.9 % (ref 11.5–15.5)
WBC: 6.5 10*3/uL (ref 4.0–10.5)
WBC: 7.3 10*3/uL (ref 4.0–10.5)
nRBC: 0 % (ref 0.0–0.2)
nRBC: 0 % (ref 0.0–0.2)

## 2021-07-31 LAB — RAPID URINE DRUG SCREEN, HOSP PERFORMED
Amphetamines: NOT DETECTED
Barbiturates: NOT DETECTED
Benzodiazepines: NOT DETECTED
Cocaine: NOT DETECTED
Opiates: NOT DETECTED
Tetrahydrocannabinol: NOT DETECTED

## 2021-07-31 LAB — COMPREHENSIVE METABOLIC PANEL
ALT: 23 U/L (ref 0–44)
AST: 31 U/L (ref 15–41)
Albumin: 4.1 g/dL (ref 3.5–5.0)
Alkaline Phosphatase: 85 U/L (ref 38–126)
Anion gap: 6 (ref 5–15)
BUN: 17 mg/dL (ref 6–20)
CO2: 22 mmol/L (ref 22–32)
Calcium: 8.6 mg/dL — ABNORMAL LOW (ref 8.9–10.3)
Chloride: 104 mmol/L (ref 98–111)
Creatinine, Ser: 1.1 mg/dL (ref 0.61–1.24)
GFR, Estimated: >60 mL/min (ref >60)
Glucose, Bld: 91 mg/dL (ref 70–99)
Potassium: 4 mmol/L (ref 3.5–5.1)
Sodium: 132 mmol/L — ABNORMAL LOW (ref 135–145)
Total Bilirubin: 1.3 mg/dL — ABNORMAL HIGH (ref 0.3–1.2)
Total Protein: 7 g/dL (ref 6.5–8.1)

## 2021-07-31 LAB — BASIC METABOLIC PANEL
Anion gap: 6 (ref 5–15)
Anion gap: 12 (ref 5–15)
BUN: 15 mg/dL (ref 6–20)
BUN: 10 mg/dL (ref 6–20)
CO2: 27 mmol/L (ref 22–32)
CO2: 21 mmol/L — ABNORMAL LOW (ref 22–32)
Calcium: 9.3 mg/dL (ref 8.9–10.3)
Calcium: 8.9 mg/dL (ref 8.9–10.3)
Chloride: 105 mmol/L (ref 98–111)
Chloride: 105 mmol/L (ref 98–111)
Creatinine, Ser: 1.13 mg/dL (ref 0.61–1.24)
Creatinine, Ser: 1.06 mg/dL (ref 0.61–1.24)
GFR, Estimated: >60 mL/min (ref >60)
GFR, Estimated: >60 mL/min (ref >60)
Glucose, Bld: 95 mg/dL (ref 70–99)
Glucose, Bld: 84 mg/dL (ref 70–99)
Potassium: 4.8 mmol/L (ref 3.5–5.1)
Potassium: 3.8 mmol/L (ref 3.5–5.1)
Sodium: 138 mmol/L (ref 135–145)
Sodium: 138 mmol/L (ref 135–145)

## 2021-07-31 LAB — URINALYSIS, ROUTINE W REFLEX MICROSCOPIC
Bilirubin Urine: NEGATIVE
Glucose, UA: NEGATIVE mg/dL
Hgb urine dipstick: NEGATIVE
Ketones, ur: NEGATIVE mg/dL
Leukocytes,Ua: NEGATIVE
Nitrite: NEGATIVE
Protein, ur: NEGATIVE mg/dL
Specific Gravity, Urine: 1.024 (ref 1.005–1.030)
pH: 5 (ref 5.0–8.0)

## 2021-07-31 LAB — RESP PANEL BY RT-PCR (FLU A&B, COVID) ARPGX2
Influenza A by PCR: NEGATIVE
Influenza B by PCR: NEGATIVE
SARS Coronavirus 2 by RT PCR: NEGATIVE

## 2021-07-31 LAB — SALICYLATE LEVEL: Salicylate Lvl: <7 mg/dL — ABNORMAL LOW (ref 7.0–30.0)

## 2021-07-31 LAB — ETHANOL: Alcohol, Ethyl (B): <10 mg/dL (ref <10)

## 2021-07-31 LAB — ACETAMINOPHEN LEVEL: Acetaminophen (Tylenol), Serum: <10 ug/mL — ABNORMAL LOW (ref 10–30)

## 2021-07-31 MED ORDER — MELATONIN 5 MG PO TABS
5.0000 mg | ORAL_TABLET | Freq: Every day | ORAL | Status: DC
  Administered 2021-07-31: 5 mg via ORAL
  Filled 2021-07-31: qty 1

## 2021-07-31 MED ORDER — LORAZEPAM 2 MG/ML IJ SOLN: 2.0000 mg | INTRAMUSCULAR | Status: DC | PRN

## 2021-07-31 MED ORDER — SODIUM CHLORIDE 0.9 % IV SOLN: INTRAVENOUS | Status: DC

## 2021-07-31 MED ORDER — LORAZEPAM 2 MG/ML IJ SOLN
2.0000 mg | Freq: Once | INTRAMUSCULAR | Status: AC
  Administered 2021-07-31: 2 mg via INTRAVENOUS
  Filled 2021-07-31: qty 1

## 2021-07-31 MED ORDER — IBUPROFEN 600 MG PO TABS: 600.0000 mg | ORAL_TABLET | Freq: Four times a day (QID) | ORAL | Status: DC | PRN

## 2021-07-31 MED ORDER — ACETAMINOPHEN 650 MG RE SUPP: 650.0000 mg | Freq: Four times a day (QID) | RECTAL | Status: DC | PRN

## 2021-07-31 MED ORDER — GADOBUTROL 1 MMOL/ML IV SOLN
9.0000 mL | Freq: Once | INTRAVENOUS | Status: AC | PRN
  Administered 2021-07-31: 9 mL via INTRAVENOUS

## 2021-07-31 MED ORDER — LEVETIRACETAM IN NACL 1500 MG/100ML IV SOLN
1500.0000 mg | Freq: Once | INTRAVENOUS | Status: AC
  Administered 2021-07-31: 1500 mg via INTRAVENOUS
  Filled 2021-07-31: qty 100

## 2021-07-31 MED ORDER — LOPERAMIDE HCL 2 MG PO CAPS: 2.0000 mg | ORAL_CAPSULE | Freq: Four times a day (QID) | ORAL | Status: DC | PRN

## 2021-07-31 MED ORDER — BICTEGRAVIR-EMTRICITAB-TENOFOV 50-200-25 MG PO TABS
1.0000 | ORAL_TABLET | Freq: Every day | ORAL | Status: DC
  Administered 2021-07-31 – 2021-08-01 (×2): 1 via ORAL
  Filled 2021-07-31 (×3): qty 1

## 2021-07-31 MED ORDER — DIPHENHYDRAMINE HCL 25 MG PO CAPS
50.0000 mg | ORAL_CAPSULE | Freq: Every evening | ORAL | Status: DC | PRN
  Administered 2021-07-31: 50 mg via ORAL
  Filled 2021-07-31: qty 2

## 2021-07-31 MED ORDER — ONDANSETRON HCL 4 MG PO TABS: 4.0000 mg | ORAL_TABLET | Freq: Four times a day (QID) | ORAL | Status: DC | PRN

## 2021-07-31 MED ORDER — ACETAMINOPHEN 325 MG PO TABS
650.0000 mg | ORAL_TABLET | Freq: Four times a day (QID) | ORAL | Status: DC | PRN
  Administered 2021-07-31 – 2021-08-01 (×2): 650 mg via ORAL
  Filled 2021-07-31 (×2): qty 2

## 2021-07-31 MED ORDER — PENICILLIN V POTASSIUM 500 MG PO TABS: 500.0000 mg | ORAL_TABLET | Freq: Three times a day (TID) | ORAL | Status: DC

## 2021-07-31 MED ORDER — LEVETIRACETAM 500 MG PO TABS: 1500.0000 mg | ORAL_TABLET | Freq: Two times a day (BID) | ORAL | Status: DC

## 2021-07-31 MED ORDER — LEVETIRACETAM 500 MG PO TABS
1000.0000 mg | ORAL_TABLET | Freq: Two times a day (BID) | ORAL | Status: DC
  Administered 2021-07-31 – 2021-08-01 (×2): 1000 mg via ORAL
  Filled 2021-07-31 (×2): qty 2

## 2021-07-31 MED ORDER — ONDANSETRON HCL 4 MG/2ML IJ SOLN: 4.0000 mg | Freq: Four times a day (QID) | INTRAMUSCULAR | Status: DC | PRN

## 2021-07-31 NOTE — ED Provider Notes (Signed)
[facility]-EMERGENCY DEPT Provider Note   CSN: [id number] Arrival date & time: 07/30/21  2316     History  Chief Complaint  Patient presents with   Seizures    Justin [name] is a 41 y.o. male.  Patient brought from rehab facility with questionable seizure.  He does have seizure disorder and takes Keppra.  He was seen earlier today with a suspected seizure and sustained a eyebrow laceration which was sutured.  He was found on the ground again at his rehab facility with concern that he could have had a seizure. No seizure was witnessed by staff.   On arrival to the ED patient did have episode of incontinence and tongue biting without witnessed seizure activity.  He is somewhat slow to respond to level 5 caveat applies.  He is oriented to person and place.  He states he took his seizure medications except for today.  Denies any fevers, chills, nausea or vomiting.  Denies any head, neck, back, chest or abdominal pain.  Denies any alcohol or drug use. In rehab for IV meth abuse.  Does have a history of HIV and states compliance with his antiretrovirals.  Last CD4 count was normal and viral load was undetectable.  The history is provided by the patient.  Seizures     Home Medications Prior to Admission medications   Medication Sig Start Date End Date Taking? Authorizing Provider  bictegravir-emtricitabine-tenofovir AF (BIKTARVY) 50-200-25 MG TABS tablet Take 1 tablet by mouth daily. 06/30/21   Comer, Belia Hemanobert W, MD  dicyclomine (BENTYL) 20 MG tablet Take 1 tablet (20 mg total) by mouth 2 (two) times daily. 07/26/21   Curatolo, Adam, DO  ibuprofen (ADVIL) 800 MG tablet Take 1 tablet (800 mg total) by mouth 3 (three) times daily. 07/20/21   Redwine, Madison A, PA-C  levETIRAcetam (KEPPRA) 500 MG tablet Take 2 tablets (1,000 mg total) by mouth 2 (two) times daily. 07/28/21   Comer, Belia Hemanobert W, MD  loperamide (IMODIUM) 2 MG capsule Take 1 capsule (2 mg total) by mouth 4  (four) times daily as needed for diarrhea or loose stools. 07/26/21   Curatolo, Adam, DO  penicillin v potassium (VEETID) 500 MG tablet Take 1 tablet (500 mg total) by mouth 3 (three) times daily. 07/30/21   Pollyann SavoySheldon, Charles B, MD      Allergies    Patient has no known allergies.    Review of Systems   Review of Systems  Unable to perform ROS: Mental status change  Neurological:  Positive for seizures.   Physical Exam Updated Vital Signs BP 125/88    Pulse 64    Temp 98.5 F (36.9 C) (Oral)    Resp 16    Ht 5\' 11"  (1.803 m)    Wt 93 kg    SpO2 96%    BMI 28.60 kg/m  Physical Exam Vitals and nursing note reviewed.  Constitutional:      General: He is not in acute distress.    Appearance: He is well-developed.     Comments: Obtunded, slow to respond, oriented to person  HENT:     Head: Normocephalic and atraumatic.     Mouth/Throat:     Pharynx: No oropharyngeal exudate.     Comments: Left-sided tongue contusion Sutured laceration to L eyebrow Eyes:     Conjunctiva/sclera: Conjunctivae normal.     Pupils: Pupils are equal, round, and reactive to light.  Neck:     Comments: No meningismus. Cardiovascular:  Rate and Rhythm: Normal rate and regular rhythm.     Heart sounds: Normal heart sounds. No murmur heard. Pulmonary:     Effort: Pulmonary effort is normal. No respiratory distress.     Breath sounds: Normal breath sounds.  Abdominal:     Palpations: Abdomen is soft.     Tenderness: There is no abdominal tenderness. There is no guarding or rebound.  Musculoskeletal:        General: No tenderness. Normal range of motion.     Cervical back: Normal range of motion and neck supple.  Skin:    General: Skin is warm.  Neurological:     Mental Status: He is alert.     Cranial Nerves: No cranial nerve deficit.     Motor: No abnormal muscle tone.     Coordination: Coordination normal.     Comments: Slow to respond but follows commands moves all extremities equally. 5/5  strength throughout.   Psychiatric:        Behavior: Behavior normal.    ED Results / Procedures / Treatments   Labs (all labs ordered are listed, but only abnormal results are displayed) Labs Reviewed  COMPREHENSIVE METABOLIC PANEL - Abnormal; Notable for the following components:      Result Value   Sodium 132 (*)    Calcium 8.6 (*)    Total Bilirubin 1.3 (*)    All other components within normal limits  ACETAMINOPHEN LEVEL - Abnormal; Notable for the following components:   Acetaminophen (Tylenol), Serum <10 (*)    All other components within normal limits  SALICYLATE LEVEL - Abnormal; Notable for the following components:   Salicylate Lvl <7.0 (*)    All other components within normal limits  CBC WITH DIFFERENTIAL/PLATELET  ETHANOL  RAPID URINE DRUG SCREEN, HOSP PERFORMED  URINALYSIS, ROUTINE W REFLEX MICROSCOPIC    EKG None  Radiology CT Head Wo Contrast  Result Date: 07/31/2021 CLINICAL DATA:  Frequent seizures. EXAM: CT HEAD WITHOUT CONTRAST TECHNIQUE: Contiguous axial images were obtained from the base of the skull through the vertex without intravenous contrast. RADIATION DOSE REDUCTION: This exam was performed according to the departmental dose-optimization program which includes automated exposure control, adjustment of the mA and/or kV according to patient size and/or use of iterative reconstruction technique. COMPARISON:  Head CT 03/27/2021. FINDINGS: Factors affecting image quality: Motion artifact limits some images above the level of the tentorium and through the middle and posterior fossa. Brain: Accounting for motion there is no definitive evidence of acute infarction, hemorrhage, hydrocephalus, extra-axial collection or mass lesion/mass effect. Vascular: No hyperdense vessel or unexpected calcification. Skull: No depressed skull fracture is seen. There is a left frontal scalp hematoma in the forehead region. Sinuses/Orbits: No acute finding.  Mild S shaped nasal  septum. Other: None. IMPRESSION: Motion limited exam grossly negative for acute process or interval changes. Left frontal scalp hematoma is seen without depressed skull fractures. Repeat the CT or obtain MRI when clinically feasible if there is continued concern. Electronically Signed   By: Almira Bar M.D.   On: 07/31/2021 01:38    Procedures Procedures    Medications Ordered in ED Medications  levETIRAcetam (KEPPRA) IVPB 1500 mg/ 100 mL premix (has no administration in time range)    ED Course/ Medical Decision Making/ A&P                           Medical Decision Making Amount and/or Complexity of Data Reviewed  Labs: ordered. Radiology: ordered.  Risk Prescription drug management.  History of seizures with suspected seizure tonight.  Does miss 1 dose of his antiepileptic.  His vitals are stable.  No fever or recent illness.  Patient given loading dose of IV Keppra.  Labs and imaging will be obtained.  Patient monitored in the ED for recovery of mental status.  He does appear to be postictal.  5:30 AM, patient is awake and alert.  He is oriented to person place and time.  He denies any pain.  He is able to tolerate p.o. and ambulate. He states he may have missed some Keppra several weeks ago but not recently.  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  States the last time he injected was approximately 6 weeks ago.  Denies Alcohol abuse.  It appears patient has had 2-3 unwitnessed seizures in the past 24 hours despite reported compliance with his Keppra.  He states he currently does not have a neurologist.  Discussed with Dr. Amada Jupiter of neurology who agrees with empiric benzos that were given earlier as well as increasing his Keppra to 1500 mg twice daily. Does not require admission if he is back to baseline.  Patient back to baseline now. No evidence of life threatening alcohol withdrawal.  He is able to tolerate p.o. and ambulate.  States he has his seizure  medications.  Follow-up with his neurologist for medication adjustment.  Do not drive or operate heavy machinery. Return precautions discussed.         Final Clinical Impression(s) / ED Diagnoses Final diagnoses:  Seizure-like activity (HCC)  Injury of head, initial encounter    Rx / DC Orders ED Discharge Orders     None         Duwayne Matters, Jeannett Senior, MD 07/31/21 [phone]

## 2021-07-31 NOTE — H&P (Signed)
History and Physical    Justin [name] [id number] DOB: 06-07-1981 DOA: 07/31/2021  PCP: Thayer Headings, MD  Patient coming from: [facility] rehab.  I have personally briefly reviewed patient's old medical records in [place]  Chief Complaint: Seizures.  HPI: Justin [name] is a 41 y.o. male with medical history significant of chronic hepatitis C, endocarditis, history of syphilis, history of HIV, seizure disorder who was seen here about 9 days ago for gum infection, prescribed clindamycin and discharged.  He developed diarrhea, had a negative C. difficile, return to the ED on 07/26/2021 and antibiotic was switched to oral penicillin.  Yesterday afternoon, he returned to the emergency department after waking up around 1400 with a left thigh laceration, "a pool of blood bedside my bed" and the left tongue contusion.  His wound was sutured.  He was given Keppra 1500 mg IVPB and then discharged back to [facility].  However, on the way there, the patient experienced a 7-minute long seizure and was brought back to the emergency department.  He was initially postictal, but now he is fully awake, alert and oriented x3.  He has not had any sleep issues.  No stimulant use or any other triggers to his knowledge.  Denies fever, chills, night sweats, changes in appetite.  No dyspnea, wheezing, productive cough or hemoptysis.  No chest pain, palpitations, diaphoresis, PND, orthopnea or pitting edema of the lower extremities.  He still continues to have diarrhea and has mild hematochezia while wiping after having a bowel movement.  Denied melena, abdominal pain, nausea, emesis or constipation.  No flank pain, dysuria, frequency or hematuria.  No polyuria, polydipsia, polyphagia or blurred vision.  ED Course: Initial vital signs were temperature 98.5 F, pulse 93, respiration 18, BP 134/78 mmHg and O2 sat 98% on room air.  The patient received 1500 mg of Keppra on his previous ED visit.  Dr. Dina Rich was  asked by neurology to repeat Keppra 1500 mg IVPB on this visit as well.  Lab work: CBC and BMP were normal.  Bilirubin was slightly elevated on a previous CMP.  Same sample last night showed mild hyponatremia and hypocalcemia, but both are normal on this morning's BMP.  Imaging: CT head without contrast was motion degraded without acute intracranial process or interval changes.  There was a left frontal scalp hematoma without any depressed skull fractures.  Please see images and full radiology report for further details.  Review of Systems: As per HPI otherwise all other systems reviewed and are negative.  Past Medical History:  Diagnosis Date   Chronic hepatitis C without hepatic coma ([place]) 08/17/2017   Endocarditis    Hepatitis C    History of syphilis 08/25/2014   Treated by [facility] 07-2014.   HIV (human immunodeficiency virus infection) ([place])    Seizures ([place])     Past Surgical History:  Procedure Laterality Date   hand surgery Right    LUMBAR PUNCTURE  08/08/2019       WRIST SURGERY      Social History  reports that he has quit smoking. His smoking use included cigarettes. He has never used smokeless tobacco. He reports that he does not currently use alcohol. He reports that he does not currently use drugs after having used the following drugs: Marijuana and Methamphetamines.  No Known Allergies  Family History  Adopted: Yes  Problem Relation Age of Onset   Diabetes type II Biological mother    Prior to Admission medications  Medication Sig Start Date End Date Taking? Authorizing Provider  bictegravir-emtricitabine-tenofovir AF (BIKTARVY) 50-200-25 MG TABS tablet Take 1 tablet by mouth daily. 06/30/21  Yes Comer, Okey Regal, MD  dicyclomine (BENTYL) 20 MG tablet Take 1 tablet (20 mg total) by mouth 2 (two) times daily. 07/26/21  Yes Curatolo, Adam, DO  ibuprofen (ADVIL) 800 MG tablet Take 1 tablet (800 mg total) by mouth 3 (three) times daily. 07/20/21  Yes Redwine, Madison A, PA-C   levETIRAcetam (KEPPRA) 500 MG tablet Take 2 tablets (1,000 mg total) by mouth 2 (two) times daily. 07/28/21  Yes Comer, Okey Regal, MD  loperamide (IMODIUM) 2 MG capsule Take 1 capsule (2 mg total) by mouth 4 (four) times daily as needed for diarrhea or loose stools. 07/26/21  Yes Curatolo, Adam, DO  penicillin v potassium (VEETID) 500 MG tablet Take 1 tablet (500 mg total) by mouth 3 (three) times daily. 07/30/21  Yes Truddie Hidden, MD    Physical Exam: Vitals:   07/31/21 0800 07/31/21 0830 07/31/21 0900 07/31/21 0930  BP: 122/88 (!) 127/91 128/90 117/89  Pulse: 74 76 78 73  Resp: 14 15 (!) 21 18  Temp:      TempSrc:      SpO2: 100% 98% 100% 100%    Constitutional: NAD, calm, comfortable Eyes: PERRL, lids and conjunctivae normal.  Left upper periorbital area sutured laceration and surrounding hematoma. ENMT: Mucous membranes are moist.  Left tongue contusion.  Posterior pharynx clear of any exudate or lesions. Neck: normal, supple, no masses, no thyromegaly Respiratory: clear to auscultation bilaterally, no wheezing, no crackles. Normal respiratory effort. No accessory muscle use.  Cardiovascular: Regular rate and rhythm, no murmurs / rubs / gallops. No extremity edema. 2+ pedal pulses. No carotid bruits.  Abdomen: No distention.  Soft, no tenderness, no masses palpated. No hepatosplenomegaly. Bowel sounds positive.  Musculoskeletal: no clubbing / cyanosis. No joint deformity upper and lower extremities. Good ROM, no contractures. Normal muscle tone.  Skin: Other than left periorbital laceration, no acute rashes, lesions, ulcers on very limited metabolic examination. Neurologic: CN 2-12 grossly intact. Sensation intact, DTR normal. Strength 5/5 in all 4.  Psychiatric: Normal judgment and insight. Alert and oriented x 3. Normal mood.   Labs on Admission: I have personally reviewed following labs and imaging studies  CBC: Recent Labs  Lab 07/26/21 1543 07/30/21 2330 07/31/21 0900   WBC 5.5 6.9 6.5  NEUTROABS 2.5 4.3 4.9  HGB 15.8 16.0 15.3  HCT 46.9 47.7 45.7  MCV 95.7 96.0 98.3  PLT 201 192 [id number]    Basic Metabolic Panel: Recent Labs  Lab 07/26/21 1543 07/30/21 2330 07/31/21 0837  NA 137 132* 138  K 4.1 4.0 4.8  CL 105 104 105  CO2 24 22 27   GLUCOSE 93 91 95  BUN 13 17 15   CREATININE 1.02 1.10 1.13  CALCIUM 9.2 8.6* 9.3    GFR: Estimated Creatinine Clearance: 101.3 mL/min (by C-G formula based on SCr of 1.13 mg/dL).  Liver Function Tests: Recent Labs  Lab 07/26/21 1543 07/30/21 2330  AST 21 31  ALT 23 23  ALKPHOS 96 85  BILITOT 1.2 1.3*  PROT 7.2 7.0  ALBUMIN 4.2 4.1    Urine analysis:    Component Value Date/Time   COLORURINE YELLOW 07/30/2021 [place] 07/30/2021 2357   LABSPEC 1.024 07/30/2021 2357   PHURINE 5.0 07/30/2021 2357   GLUCOSEU NEGATIVE 07/30/2021 2357   HGBUR NEGATIVE 07/30/2021 2357   BILIRUBINUR NEGATIVE  07/30/2021 [place] 07/30/2021 2357   PROTEINUR NEGATIVE 07/30/2021 2357             NITRITE NEGATIVE 07/30/2021 2357   LEUKOCYTESUR NEGATIVE 07/30/2021 2357    Radiological Exams on Admission: CT Head Wo Contrast  Result Date: 07/31/2021 CLINICAL DATA:  Frequent seizures. EXAM: CT HEAD WITHOUT CONTRAST TECHNIQUE: Contiguous axial images were obtained from the base of the skull through the vertex without intravenous contrast. RADIATION DOSE REDUCTION: This exam was performed according to the departmental dose-optimization program which includes automated exposure control, adjustment of the mA and/or kV according to patient size and/or use of iterative reconstruction technique. COMPARISON:  Head CT 03/27/2021. FINDINGS: Factors affecting image quality: Motion artifact limits some images above the level of the tentorium and through the middle and posterior fossa. Brain: Accounting for motion there is no definitive evidence of acute infarction, hemorrhage, hydrocephalus,  extra-axial collection or mass lesion/mass effect. Vascular: No hyperdense vessel or unexpected calcification. Skull: No depressed skull fracture is seen. There is a left frontal scalp hematoma in the forehead region. Sinuses/Orbits: No acute finding.  Mild S shaped nasal septum. Other: None. IMPRESSION: Motion limited exam grossly negative for acute process or interval changes. Left frontal scalp hematoma is seen without depressed skull fractures. Repeat the CT or obtain MRI when clinically feasible if there is continued concern. Electronically Signed   By: Telford Nab M.D.   On: 07/31/2021 01:38    EKG: Independently reviewed.   Assessment/Plan Principal Problem:   Seizures ([place]) Observation/PCU. Seizure precautions. Will need continuous EEG at [facility]. Increase Keppra to 1500 mg p.o. twice daily. Lorazepam 2 mg IVP every 4 hours as needed for seizures. Obtain MRI of the brain with/without contrast. Consult neuro hospitalist team on arrival to [facility].  Active Problems:   Human immunodeficiency virus (HIV) disease ([place]) Continue Biktarvy 1 tablet daily. Follow-up at the ID clinic.    Chronic hepatitis C without hepatic coma ([place]) Continue Biktarvy. Follow-up with infectious diseases.    Substance abuse ([place]) Continue treatment program at [facility].   DVT prophylaxis: SCDs. Code Status:   Full code. Family Communication:   Disposition Plan:   Patient is from:  [facility] rehab.  Anticipated DC to:  [facility] rehab.  Anticipated DC date:  08/01/2021 or 08/02/2021.  Anticipated DC barriers: Clinical status.  Consults called:  Neuro hospitalist team to consult on arrival to [facility]. Admission status:  Observation/telemetry.  Severity of Illness: High severity in the setting of multiple seizures episodes despite taking Keppra as prescribed.  Reubin Milan MD [facility]  How to contact the [facility] Attending or Consulting provider [place] or covering provider during after hours [place],  for this patient?   Check the care team in [facility] and look for a) attending/consulting [facility] provider listed and b) the [facility] team listed Log into [email or web address] and use [place]'s universal password to access. If you do not have the password, please contact the hospital operator. Locate the [facility] provider you are looking for under [facility] and page to a number that you can be directly reached. If you still have difficulty reaching the provider, please page the [facility] (Director on Call) for the Hospitalists listed on amion for assistance.  07/31/2021, 10:16 AM   This document was prepared using Dragon voice recognition software and may contain some unintended transcription errors.

## 2021-07-31 NOTE — Consult Note (Signed)
Neurology Consultation  Reason for Consult: seizure activity Referring Physician: Dr. Wilkie Aye  CC: seizures  History is obtained from:chart  HPI: Justin [name] is a 41 y.o. male with a history of HIV, hepatitis C, endocarditis, syphilis, polysubstance abuse, seizure disorder with multiple breakthrough seizures in the setting of non-adherence with medications in the past who is presented to [facility] with breakthrough seizures. 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 He endorses compliance with his AEDs at this time. Regarding patient's seizure history, his first reported seizure was in May 2020 following IV heroin use and he has no documented follow up with neurology outpatient.  Some appointments were made but I do not think he has ever followed up in person.  Patient was recently seen in the ED on 1/17 with dental caries and was placed on clindamycin at that time for a dental infection but was transitioned to penicillin on 1/23 due to complaints of diarrhea. On 1/27, patient reportedly went to take a nap at his drug rehab facility and woke up "in a pool of blood" with a laceration to his left eyebrow and a tongue contusion with a presumed unwitnessed seizure. He was evaluated in the ED, his laceration was sutured and he was discharged back to his facility. Again later last night, patient returned to the ED for another presumed seizure while at his facility after being found on the ground. He had another possible seizure while in the ED when he was found laying on the siderail by his RN with fecal incontinence and hypoxia. At this time, patient was loaded with 1500 mg Keppra and his Keppra dose was increased to 1500 mg BID. He was discharged back to his facility again as he was at his baseline mental status. While on the way back to his facility, patient had a witnessed 7  minute seizure described as generalized shaking and returned to the ED for further evaluation. Due to ongoing seizure activity, reports of compliance with AEDs, and no obvious provoking factors, patient was admitted for further seizure work up and evaluation and neurology was consulted.   ROS: A complete ROS was performed and is negative except as noted in the HPI.    Past Medical History:  Diagnosis Date   Chronic hepatitis C without hepatic coma (H[facility]) 08/17/2017   Endocarditis    Hepatitis C    History of syphilis 08/25/2014   Treated by [facility] 07-2014.   HIV (human immunodeficiency virus infection) ([facility])    Seizures ([facility])    Past Surgical History:  Procedure Laterality Date   hand surgery Right    LUMBAR PUNCTURE  08/08/2019       WRIST SURGERY     Family History  Adopted: Yes  Problem Relation Age of Onset   Diabetes type II Mother    Social History:   reports that he has quit smoking. His smoking use included cigarettes. He has never used smokeless tobacco. He reports that he does not currently use alcohol. He reports that he does not currently use drugs after having used the following drugs: Marijuana and Methamphetamines.  Medications  Current Facility-Administered Medications:    0.9 %  sodium chloride infusion, , Intravenous, Continuous, Bobette Mo, MD, Last Rate: 25 mL/hr at 07/31/21 1043, New Bag at 07/31/21 1043   acetaminophen (TYLENOL) tablet 650 mg, 650 mg, Oral, Q6H PRN **OR** acetaminophen (TYLENOL) suppository  650 mg, 650 mg, Rectal, Q6H PRN, Bobette Mo, MD   bictegravir-emtricitabine-tenofovir AF (BIKTARVY) 50-200-25 MG per tablet 1 tablet, 1 tablet, Oral, Daily, Bobette Mo, MD   ibuprofen (ADVIL) tablet 600 mg, 600 mg, Oral, Q6H PRN, Bobette Mo, MD   levETIRAcetam (KEPPRA) IVPB 1500 mg/ 100 mL premix, 1,500 mg, Intravenous, Once, Horton, Kristie M, DO, Last Rate: 400 mL/hr at 07/31/21 1043, 1,500 mg at 07/31/21 1043   levETIRAcetam  (KEPPRA) tablet 1,500 mg, 1,500 mg, Oral, BID, Bobette Mo, MD   loperamide (IMODIUM) capsule 2 mg, 2 mg, Oral, QID PRN, Bobette Mo, MD   LORazepam (ATIVAN) injection 2 mg, 2 mg, Intravenous, Q4H PRN, Bobette Mo, MD   ondansetron [facility]) tablet 4 mg, 4 mg, Oral, Q6H PRN **OR** ondansetron (ZOFRAN) injection 4 mg, 4 mg, Intravenous, Q6H PRN, Bobette Mo, MD  Current Outpatient Medications:    bictegravir-emtricitabine-tenofovir AF (BIKTARVY) 50-200-25 MG TABS tablet, Take 1 tablet by mouth daily., Disp: 30 tablet, Rfl: 11   dicyclomine (BENTYL) 20 MG tablet, Take 1 tablet (20 mg total) by mouth 2 (two) times daily., Disp: 20 tablet, Rfl: 0   ibuprofen (ADVIL) 800 MG tablet, Take 1 tablet (800 mg total) by mouth 3 (three) times daily., Disp: 21 tablet, Rfl: 0   levETIRAcetam (KEPPRA) 500 MG tablet, Take 2 tablets (1,000 mg total) by mouth 2 (two) times daily., Disp: 120 tablet, Rfl: 5   loperamide (IMODIUM) 2 MG capsule, Take 1 capsule (2 mg total) by mouth 4 (four) times daily as needed for diarrhea or loose stools., Disp: 12 capsule, Rfl: 0   penicillin v potassium (VEETID) 500 MG tablet, Take 1 tablet (500 mg total) by mouth 3 (three) times daily., Disp: 30 tablet, Rfl: 0   Exam: Current vital signs: BP (!) 122/94    Pulse 79    Temp 98.5 F (36.9 C) (Oral)    Resp 13    SpO2 99%  Vital signs in last 24 hours: Temp:  [97.8 F (36.6 C)-98.6 F (37 C)] 98.5 F (36.9 C) (01/28 0720) Pulse Rate:  [62-117] 79 (01/28 1030) Resp:  [11-21] 13 (01/28 1030) BP: (110-134)/(76-94) 122/94 (01/28 1030) SpO2:  [76 %-100 %] 99 % (01/28 1030) Weight:  [93 kg] 93 kg (01/27 2325)  GENERAL: Awake, alert, in no acute distress Psych: Affect appropriate for situation, patient is calm and cooperative with examination Head: Normocephalic, laceration which is sutured in the left eyebrow EENT: Normal conjunctivae, dry mucous membranes, no OP obstruction LUNGS: Normal  respiratory effort. Non-labored breathing on room air CV: Regular rate and rhythm on telemetry ABDOMEN: Soft, non-tender, non-distended Extremities: warm, well perfused, without obvious deformity Neurological exam Awake alert oriented x3 No evidence of dysarthria or aphasia Cranial nerves II through XII intact Motor system examination with no weakness noted Sensory examination with no deficits Coordination examination with no dysmetria DTRs 2+    Labs I have reviewed labs in epic and the results pertinent to this consultation are:  CBC    Component Value Date/Time   WBC 6.5 07/31/2021 0900   RBC 4.65 07/31/2021 0900   HGB 15.3 07/31/2021 0900   HCT 45.7 07/31/2021 0900   PLT 150 07/31/2021 0900   MCV 98.3 07/31/2021 0900   MCH 32.9 07/31/2021 0900   MCHC 33.5 07/31/2021 0900   RDW 12.3 07/31/2021 0900   LYMPHSABS 1.1 07/31/2021 0900   MONOABS 0.5 07/31/2021 0900   EOSABS 0.0 07/31/2021 0900   BASOSABS  0.0 07/31/2021 0900    CMP     Component Value Date/Time   NA 138 07/31/2021 0837   K 4.8 07/31/2021 0837   CL 105 07/31/2021 0837   CO2 27 07/31/2021 0837   GLUCOSE 95 07/31/2021 0837   BUN 15 07/31/2021 0837   CREATININE 1.13 07/31/2021 0837   CREATININE 1.16 02/09/2021 0916   CALCIUM 9.3 07/31/2021 0837   PROT 7.0 07/30/2021 2330   ALBUMIN 4.1 07/30/2021 2330   AST 31 07/30/2021 2330   ALT 23 07/30/2021 2330   ALKPHOS 85 07/30/2021 2330   BILITOT 1.3 (H) 07/30/2021 2330   GFRNONAA >60 07/31/2021 0837   GFRNONAA 80 11/27/2020 1038   GFRAA 92 11/27/2020 1038    Lipid Panel     Component Value Date/Time   CHOL 179 09/21/2020 0000   TRIG 49 09/21/2020 0000   HDL 83 09/21/2020 0000   CHOLHDL 2.2 09/21/2020 0000   VLDL 8 04/18/2020 1230   LDLCALC 82 09/21/2020 0000     Imaging I have reviewed the images obtained:  CT-scan of the brain-no acute changes   Assessment:  41 year old man with history of seizure disorder, HIV, hepatitis C, syphilis,  endocarditis with multiple breakthrough seizures, in the past also history of nonadherence but reports adherence to medications at this time.  Also has a history of substance abuse which he is in recovery from at this time. Needs admission for EEG and imaging for further evaluation given the immunosuppressed history. No fevers or examination findings suggestive of an acute CNS infection.  Impression: -Breakthrough seizures -Although clinically does not appear to be exhibiting any signs of opportunistic infection, MRI of the brain should be helpful to evaluate for any underlying opportunistic infection given history of HIV.  Recommendations: [facility]ine [facility]. MRI brain with and without contrast Ativan for seizure lasting more than 5 minutes For seizure prevention-Keppra 1000 mg twice daily. Seizure precautions Management of HIV per primary team Plan discussed with Dr. Wilkie AyeHorton  -- Milon DikesAshish Alby Schwabe, MD Neurologist [facility] Pager: [phone]

## 2021-07-31 NOTE — Progress Notes (Signed)
EEG complete - results pending 

## 2021-07-31 NOTE — Discharge Instructions (Signed)
Increase your Keppra to 1500 mg twice daily.  Do not drive or operate heavy machinery until you are cleared by your neurologist.  Return to the ED with new or worsening symptoms.

## 2021-07-31 NOTE — Progress Notes (Signed)
Patient arrived to room 5W07. Assisted to bed. Alert and oriented x4, memory impairment noted when asked about living situation and support system. Patient states he cannot recall "where home is and who lives there" when asked. Patient oriented to unit and call system, currently resting in bed, VSS, call light and personal Items placed with in reach of patient.

## 2021-07-31 NOTE — ED Triage Notes (Signed)
Patient discharged from here for same complaint and began having a seizure on the way home. Per witness, seizure lasted approx 7 minutes

## 2021-07-31 NOTE — Procedures (Signed)
Patient Name: Justin [name]  MRN: [id number]  Epilepsy Attending: Charlsie Quest  Referring Physician/Provider: Milon Dikes, MD Date: 07/31/2021 Duration: 23.06 mins  Patient history: 41 year old man with history of seizure disorder, HIV, hepatitis C, syphilis, endocarditis with multiple breakthrough seizures, in the past also history of nonadherence but reports adherence to medications at this time.  Also has a history of substance abuse which he is in recovery from at this time. EEG to evaluate for seizure  Level of alertness: Awake, drowsy  AEDs during EEG study: LEV  Technical aspects: This EEG study was done with scalp electrodes positioned according to the 10-20 International system of electrode placement. Electrical activity was acquired at a sampling rate of 500Hz  and reviewed with a high frequency filter of 70Hz  and a low frequency filter of 1Hz . EEG data were recorded continuously and digitally stored.   Description: No clear posterior dominant rhythm consists of 9-10 Hz activity of moderate voltage (25-35 uV) seen predominantly in posterior head regions, symmetric and reactive to eye opening and eye closing. There is an excessive amount of 15 to 18 Hz beta activity distributed symmetrically and diffusely. Physiologic photic driving was seen during photic stimulation.  Hyperventilation was not performed.     ABNORMALITY - Excessive beta, generalized  IMPRESSION: This study is within normal limits. The excessive beta activity seen in the background is most likely due to the effect of benzodiazepine and is a benign EEG pattern. No seizures or epileptiform discharges were seen throughout the recording.  [name] 

## 2021-07-31 NOTE — ED Notes (Signed)
[facility] at bedside to transfer patient to [facility]

## 2021-07-31 NOTE — ED Provider Notes (Signed)
[facility]-EMERGENCY DEPT Provider Note   CSN: [id number] Arrival date & time: 07/31/21  4656     History  Chief Complaint  Patient presents with   Seizures    Justin [name] is a 41 y.o. male.  HPI  41 year old male with past medical history of HIV, seizures presents to the emergency department with reported seizure-like activity.  Of note patient has been seen twice in the last 24 hours in our emergency department for seizure activity.  At the first visit sustained a left eyebrow laceration that was repaired.  At this most recent visit had medications that were adjusted by our neurology team.  He reportedly had a seizure after leaving the department on the way home.  Per witnesses the seizure was generalized shaking, lasted approximately 7 minutes.  No head injury.  Patient states that he does not have outpatient neurology follow-up.  His medications are usually adjusted by the ER, he follows with infectious disease for HIV and they have been prescribing his Keppra in the past.  He denies any new medications, recent illness, fever.  He has currently returned back to baseline mental status and denies any headache or neck pain.  Patient at this time feels fatigued but no other specific complaints.  Home Medications Prior to Admission medications   Medication Sig Start Date End Date Taking? Authorizing Provider  bictegravir-emtricitabine-tenofovir AF (BIKTARVY) 50-200-25 MG TABS tablet Take 1 tablet by mouth daily. 06/30/21   Comer, Belia Heman, MD  dicyclomine (BENTYL) 20 MG tablet Take 1 tablet (20 mg total) by mouth 2 (two) times daily. 07/26/21   Curatolo, Adam, DO  ibuprofen (ADVIL) 800 MG tablet Take 1 tablet (800 mg total) by mouth 3 (three) times daily. 07/20/21   Redwine, Madison A, PA-C  levETIRAcetam (KEPPRA) 500 MG tablet Take 2 tablets (1,000 mg total) by mouth 2 (two) times daily. 07/28/21   Comer, Belia Heman, MD  loperamide (IMODIUM) 2 MG capsule Take 1  capsule (2 mg total) by mouth 4 (four) times daily as needed for diarrhea or loose stools. 07/26/21   Curatolo, Adam, DO  penicillin v potassium (VEETID) 500 MG tablet Take 1 tablet (500 mg total) by mouth 3 (three) times daily. 07/30/21   Pollyann Savoy, MD      Allergies    Patient has no known allergies.    Review of Systems   Review of Systems  Constitutional:  Negative for fever.  Respiratory:  Negative for shortness of breath.   Cardiovascular:  Negative for chest pain.  Gastrointestinal:  Negative for abdominal pain, diarrhea and vomiting.  Musculoskeletal:  Negative for back pain and neck pain.  Skin:  Negative for rash.  Neurological:  Positive for seizures. Negative for headaches.   Physical Exam Updated Vital Signs BP (!) 127/91    Pulse 76    Temp 98.5 F (36.9 C) (Oral)    Resp 15    SpO2 98%  Physical Exam Vitals and nursing note reviewed.  Constitutional:      General: He is not in acute distress.    Appearance: Normal appearance. He is not ill-appearing or diaphoretic.  HENT:     Head: Normocephalic.     Mouth/Throat:     Mouth: Mucous membranes are moist.  Eyes:     Comments: Left eyebrow laceration sutured  Cardiovascular:     Rate and Rhythm: Normal rate.  Pulmonary:     Effort: Pulmonary effort is normal. No respiratory distress.  Abdominal:  Palpations: Abdomen is soft.     Tenderness: There is no abdominal tenderness.  Musculoskeletal:        General: No swelling or deformity.     Cervical back: No rigidity.  Skin:    General: Skin is warm.  Neurological:     Mental Status: He is alert and oriented to person, place, and time. Mental status is at baseline.     Cranial Nerves: No cranial nerve deficit.  Psychiatric:        Mood and Affect: Mood normal.    ED Results / Procedures / Treatments   Labs (all labs ordered are listed, but only abnormal results are displayed) Labs Reviewed  RESP PANEL BY RT-PCR (FLU A&B, COVID) ARPGX2  CBC WITH  DIFFERENTIAL/PLATELET  BASIC METABOLIC PANEL  URINALYSIS, ROUTINE W REFLEX MICROSCOPIC  BASIC METABOLIC PANEL  CBC WITH DIFFERENTIAL/PLATELET    EKG None  Radiology CT Head Wo Contrast  Result Date: 07/31/2021 CLINICAL DATA:  Frequent seizures. EXAM: CT HEAD WITHOUT CONTRAST TECHNIQUE: Contiguous axial images were obtained from the base of the skull through the vertex without intravenous contrast. RADIATION DOSE REDUCTION: This exam was performed according to the departmental dose-optimization program which includes automated exposure control, adjustment of the mA and/or kV according to patient size and/or use of iterative reconstruction technique. COMPARISON:  Head CT 03/27/2021. FINDINGS: Factors affecting image quality: Motion artifact limits some images above the level of the tentorium and through the middle and posterior fossa. Brain: Accounting for motion there is no definitive evidence of acute infarction, hemorrhage, hydrocephalus, extra-axial collection or mass lesion/mass effect. Vascular: No hyperdense vessel or unexpected calcification. Skull: No depressed skull fracture is seen. There is a left frontal scalp hematoma in the forehead region. Sinuses/Orbits: No acute finding.  Mild S shaped nasal septum. Other: None. IMPRESSION: Motion limited exam grossly negative for acute process or interval changes. Left frontal scalp hematoma is seen without depressed skull fractures. Repeat the CT or obtain MRI when clinically feasible if there is continued concern. Electronically Signed   By: Almira Bar M.D.   On: 07/31/2021 01:38    Procedures Procedures    Medications Ordered in ED Medications - No data to display  ED Course/ Medical Decision Making/ A&P                           Medical Decision Making Amount and/or Complexity of Data Reviewed Labs: ordered. Radiology: ordered.  Risk Prescription drug management. Decision regarding hospitalization.   This patient presents  to the ED for concern of seizure-like activity, this involves an extensive number of treatment options, and is a complaint that carries with it a high risk of complications and morbidity.  The differential diagnosis includes seizures, breakthrough seizures, noncompliant/uncontrolled epilepsy   Additional history obtained: -External records from outside source obtained and reviewed including: Chart review including previous notes, labs, imaging, consultation notes   Lab Tests: -I ordered, reviewed, and interpreted labs.  The pertinent results include: No acute change in baseline lab work   EKG -Sinus rhythm   Imaging Studies ordered: -I ordered imaging studies including previous CT imaging -I independently visualized and interpreted imaging which showed no acute findings -I agree with the radiologist interpretation   Medicines ordered and prescription drug management: -I ordered medication including IV Keppra  for breakthrough seizure activity   -Reevaluation of the patient after these medicines showed that the patient improved -I have reviewed the patients home medicines  and have made adjustments as needed   Consultations Obtained: I requested consultation with the neurology team Dr. Wilford CornerArora,  and discussed lab and imaging findings as well as pertinent plan - they recommend: IV Keppra load, MRI of brain with and without to evaluate for possible HIV encephalopathy, EEG, [facility] for the above work-up and neuro consultation   ED Course: 41 year old male presents emergency department with seizure-like activity.  This is his third visit in the last 24 hours for seizure-like activity.  Medications have been adjusted, loaded and increased with continued breakthrough activity.  Patient is back to baseline, stable vitals, no further seizure activity.  No complaints at this time.  Does not have outpatient neurology follow-up.  Given his recurrent seizure activity especially in the last  24 hours with medication adjustment plan for medical admission, neurology consult.   Critical Interventions: Neurology consult   Cardiac Monitoring: The patient was maintained on a cardiac monitor.  I personally viewed and interpreted the cardiac monitored which showed an underlying rhythm of: Normal sinus rhythm   Reevaluation: After the interventions noted above, I reevaluated the patient and found that they have :improved   Dispostion: Patients evaluation and results requires admission for further treatment and care.  Spoke with hospitalist Dr. Robb Matarrtiz, reviewed patient's ED course and they accept admission.  Patient agrees with admission plan, offers no new complaints and is stable/unchanged at time of admit.        Final Clinical Impression(s) / ED Diagnoses Final diagnoses:  None    Rx / DC Orders ED Discharge Orders     None         Rozelle LoganHorton, Geovonni Meyerhoff M, DO 07/31/21 1003

## 2021-08-01 MED ORDER — IBUPROFEN 800 MG PO TABS: 800.0000 mg | ORAL_TABLET | Freq: Three times a day (TID) | ORAL | 0 refills | Status: DC | PRN

## 2021-08-01 MED ORDER — LEVETIRACETAM 250 MG PO TABS: 1250.0000 mg | ORAL_TABLET | Freq: Two times a day (BID) | ORAL | 0 refills | Status: DC

## 2021-08-01 MED ORDER — LEVETIRACETAM 750 MG PO TABS: 1250.0000 mg | ORAL_TABLET | Freq: Two times a day (BID) | ORAL | Status: DC

## 2021-08-01 NOTE — TOC Transition Note (Signed)
Transition of Care [facility]) - CM/SW Discharge Note   Patient Details  Name: Justin [name] MRN: [id number] Date of Birth: 12/12/1980  Transition of Care [facility] CM/SW Contact:  Jimmy Picket, LCSW Phone Number: 08/01/2021, 10:45 AM   Clinical Narrative:     [name] is able to accept pt at DC. [facility] will transport pt. [facility] Staff asked for pt to be at ED entrance. [facility] staff will call Nurse when they are near entrance.    Final next level of care: Other (comment) [facility]) Barriers to Discharge: No Barriers Identified   Patient Goals and CMS Choice Patient states their goals for this hospitalization and ongoing recovery are:: Complete substance use treatment      Discharge Placement              Patient chooses bed at: Other - please specify in the comment section below: [facility]) Patient to be transferred to facility by: [facility] Staff Name of family member notified: none Patient and family notified of of transfer: 08/01/21  Discharge Plan and Services                                     Social Determinants of Health (SDOH) Interventions     Readmission Risk Interventions No flowsheet data found.  Jimmy Picket, LCSW Clinical Social Worker

## 2021-08-01 NOTE — Discharge Summary (Signed)
Physician Discharge Summary  Justin [name] [id number] DOB: [birth year] DOA: 07/31/2021  PCP: Thayer Headings, MD  Admit date: 07/31/2021 Discharge date: 08/01/2021  Admitted From: [facility] Disposition:  [facility]  Recommendations for Outpatient Follow-up:  Follow up with PCP in 1-2 weeks Please obtain BMP/CBC in one week Patient to DC sutures in left eyebrow in 6 days Patient to continue with usual precautions as below.  Patient has been informed about these precautions.    SEIZURE PRECAUTIONS Per [place] statutes, patients with seizures are not allowed to drive until they have been seizure-free for six months.    Use caution when using heavy equipment or power tools. Avoid working on ladders or at heights. Take showers instead of baths. Ensure the water temperature is not too high on the home water heater. Do not go swimming alone. Do not lock yourself in a room alone (i.e. bathroom). When caring for infants or small children, sit down when holding, feeding, or changing them to minimize risk of injury to the child in the event you have a seizure. Maintain good sleep hygiene. Avoid alcohol.    If patient has another seizure, call 911 and bring them back to the ED if: A.  The seizure lasts longer than 5 minutes.      B.  The patient doesn't wake shortly after the seizure or has new problems such as difficulty seeing, speaking or moving following the seizure C.  The patient was injured during the seizure D.  The patient has a temperature over 102 F (39C) E.  The patient vomited during the seizure and now is having trouble breathing    Discharge Condition:Stable CODE STATUS:FULL) Diet recommendation: regular   Brief/Interim Summary:   HPI on Admission 07/31/2021 by Dr Olevia Bowens, Justin [name] is a 41 y.o. male with medical history significant of chronic hepatitis C, endocarditis, history of syphilis, history of HIV, seizure disorder who was seen here  about 9 days ago for gum infection, prescribed clindamycin and discharged.  He developed diarrhea, had a negative C. difficile, return to the ED on 07/26/2021 and antibiotic was switched to oral penicillin.  Yesterday afternoon, he returned to the emergency department after waking up around 1400 with a left thigh laceration, "a pool of blood bedside my bed" and the left tongue contusion.  His wound was sutured.  He was given Keppra 1500 mg IVPB and then discharged back to [facility].  However, on the way there, the patient experienced a 7-minute long seizure and was brought back to the emergency department.  He was initially postictal, but now he is fully awake, alert and oriented x3.  He has not had any sleep issues.  No stimulant use or any other triggers to his knowledge.  Denies fever, chills, night sweats, changes in appetite.  No dyspnea, wheezing, productive cough or hemoptysis.  No chest pain, palpitations, diaphoresis, PND, orthopnea or pitting edema of the lower extremities.  He still continues to have diarrhea and has mild hematochezia while wiping after having a bowel movement.  Denied melena, abdominal pain, nausea, emesis or constipation.  No flank pain, dysuria, frequency or hematuria.  No polyuria, polydipsia, polyphagia or blurred vision.   ED Course: Initial vital signs were temperature 98.5 F, pulse 93, respiration 18, BP 134/78 mmHg and O2 sat 98% on room air.  The patient received 1500 mg of Keppra on his previous ED visit.  Dr. Dina Rich was asked by neurology to repeat Keppra 1500  mg IVPB on this visit as well.   Lab work: CBC and BMP were normal.  Bilirubin was slightly elevated on a previous CMP.  Same sample last night showed mild hyponatremia and hypocalcemia, but both are normal on this morning's BMP.   Imaging: CT head without contrast was motion degraded without acute intracranial process or interval changes.  There was a left frontal scalp hematoma without any depressed skull  fractures.  Please see images and full radiology report for further details.       Hospital course p  Seizures - MRI of the brain unremarkable for any acute process or evidence of opportunistic infection -Patient was seen by neurology, had EEG done, it was normal, recommendation is to increase Keppra from 1000 mg oral twice daily to 1250 mg twice daily, to continue with seizure precautions, to follow-up with neurology in 4 to 6 weeks. -Patient was informed about seizure precautions.   Human immunodeficiency virus (HIV) disease ([place]) Continue Biktarvy 1 tablet daily. Follow-up at the ID clinic.     Chronic hepatitis C without hepatic coma ([place]) Continue Biktarvy. Follow-up with infectious diseases.     Substance abuse ([place]) Continue treatment program at [facility].   Patient will need his left eyebrow sutures removed within 6 days.  Discharge Diagnoses:  Principal Problem:   Seizures ([place]) Active Problems:   Human immunodeficiency virus (HIV) disease ([place])   Substance abuse ([place])   Chronic hepatitis C without hepatic coma ([place])    Discharge Instructions  Discharge Instructions     Diet - low sodium heart healthy   Complete by: As directed    Discharge instructions   Complete by: As directed    Follow with Primary MD   Get CBC, CMP,  checked  by Primary MD next visit.    Activity: As tolerated with Full fall precautions use walker/cane & assistance as needed   Disposition [facility]   Diet: Regular   On your next visit with your primary care physician please Get Medicines reviewed and adjusted.   Please request your Prim.MD to go over all Hospital Tests and Procedure/Radiological results at the follow up, please get all Hospital records sent to your Prim MD by signing hospital release before you go home.   If you experience worsening of your admission symptoms, develop shortness of breath, life threatening emergency, suicidal or homicidal thoughts you must  seek medical attention immediately by calling 911 or calling your MD immediately  if symptoms less severe.  You Must read complete instructions/literature along with all the possible adverse reactions/side effects for all the Medicines you take and that have been prescribed to you. Take any new Medicines after you have completely understood and accpet all the possible adverse reactions/side effects.   Do not drive, operating heavy machinery, perform activities at heights, swimming or participation in water activities or provide baby sitting services if your were admitted for syncope or siezures until you have seen by Primary MD or a Neurologist and advised to do so again.  Do not drive when taking Pain medications.    Do not take more than prescribed Pain, Sleep and Anxiety Medications  Special Instructions: If you have smoked or chewed Tobacco  in the last 2 yrs please stop smoking, stop any regular Alcohol  and or any Recreational drug use.  Wear Seat belts while driving.   Please note  You were cared for by a hospitalist during your hospital stay. If you have any questions about your discharge  medications or the care you received while you were in the hospital after you are discharged, you can call the unit and asked to speak with the hospitalist on call if the hospitalist that took care of you is not available. Once you are discharged, your primary care physician will handle any further medical issues. Please note that NO REFILLS for any discharge medications will be authorized once you are discharged, as it is imperative that you return to your primary care physician (or establish a relationship with a primary care physician if you do not have one) for your aftercare needs so that they can reassess your need for medications and monitor your lab values.   Discharge wound care:   Complete by: As directed    Please DC sutures in left eyebrow in 6 days.   Increase activity slowly   Complete  by: As directed       Allergies as of 08/01/2021   No Known Allergies      Medication List     TAKE these medications    Biktarvy 50-200-25 MG Tabs tablet Generic drug: bictegravir-emtricitabine-tenofovir AF Take 1 tablet by mouth daily.   citalopram 20 MG tablet Commonly known as: CELEXA Take 20 mg by mouth daily.   dicyclomine 20 MG tablet Commonly known as: BENTYL Take 1 tablet (20 mg total) by mouth 2 (two) times daily.   ibuprofen 800 MG tablet Commonly known as: ADVIL Take 1 tablet (800 mg total) by mouth every 8 (eight) hours as needed for fever, headache, mild pain, moderate pain or cramping. What changed:  when to take this reasons to take this   levETIRAcetam 250 MG tablet Commonly known as: Keppra Take 5 tablets (1,250 mg total) by mouth 2 (two) times daily. What changed:  medication strength how much to take   loperamide 2 MG capsule Commonly known as: IMODIUM Take 1 capsule (2 mg total) by mouth 4 (four) times daily as needed for diarrhea or loose stools.   penicillin v potassium 500 MG tablet Commonly known as: VEETID Take 1 tablet (500 mg total) by mouth 3 (three) times daily.               Discharge Care Instructions  (From admission, onward)           Start     Ordered   08/01/21 0000  Discharge wound care:       Comments: Please DC sutures in left eyebrow in 6 days.   08/01/21 1052            No Known Allergies  Consultations: neurology   Procedures/Studies: CT Head Wo Contrast  Result Date: 07/31/2021 CLINICAL DATA:  Frequent seizures. EXAM: CT HEAD WITHOUT CONTRAST TECHNIQUE: Contiguous axial images were obtained from the base of the skull through the vertex without intravenous contrast. RADIATION DOSE REDUCTION: This exam was performed according to the departmental dose-optimization program which includes automated exposure control, adjustment of the mA and/or kV according to patient size and/or use of iterative  reconstruction technique. COMPARISON:  Head CT 03/27/2021. FINDINGS: Factors affecting image quality: Motion artifact limits some images above the level of the tentorium and through the middle and posterior fossa. Brain: Accounting for motion there is no definitive evidence of acute infarction, hemorrhage, hydrocephalus, extra-axial collection or mass lesion/mass effect. Vascular: No hyperdense vessel or unexpected calcification. Skull: No depressed skull fracture is seen. There is a left frontal scalp hematoma in the forehead region. Sinuses/Orbits: No acute finding.  Mild S  shaped nasal septum. Other: None. IMPRESSION: Motion limited exam grossly negative for acute process or interval changes. Left frontal scalp hematoma is seen without depressed skull fractures. Repeat the CT or obtain MRI when clinically feasible if there is continued concern. Electronically Signed   By: Telford Nab M.D.   On: 07/31/2021 01:38   MR Brain W and Wo Contrast  Result Date: 07/31/2021 CLINICAL DATA:  New onset seizure, history of trauma, HIV EXAM: MRI HEAD WITHOUT AND WITH CONTRAST TECHNIQUE: Multiplanar, multiecho pulse sequences of the brain and surrounding structures were obtained without and with intravenous contrast. CONTRAST:  26mL GADAVIST GADOBUTROL 1 MMOL/ML IV SOLN COMPARISON:  Same day CT head, MR head 04/01/2020 FINDINGS: Brain: There is no evidence of acute intracranial hemorrhage, extra-axial fluid collection, or acute infarct. Parenchymal volume is normal. The ventricles are normal in size. Parenchymal signal is normal. There is no mass lesion. There is no abnormal enhancement. There is no midline shift. There is no structural or migration abnormality. The hippocampi are symmetric and normal in appearance. Vascular: Normal flow voids. Skull and upper cervical spine: Normal marrow signal. Sinuses/Orbits: The paranasal sinuses are clear. The globes and orbits are unremarkable. Other: There is a frontal scalp  hematoma, similar to the prior CT. IMPRESSION: 1. No acute intracranial pathology or epileptogenic focus identified. 2. Frontal scalp hematoma, similar to the prior CT. Electronically Signed   By: Valetta Mole M.D.   On: 07/31/2021 13:44   EEG adult  Result Date: 07/31/2021 Lora Havens, MD     07/31/2021  9:49 PM Patient Name: GADDIEL CASOLA MRN: [id number] Epilepsy Attending: Lora Havens Referring Physician/Provider: Amie Portland, MD Date: 07/31/2021 Duration: 23.06 mins Patient history: 41 year old man with history of seizure disorder, HIV, hepatitis C, syphilis, endocarditis with multiple breakthrough seizures, in the past also history of nonadherence but reports adherence to medications at this time.  Also has a history of substance abuse which he is in recovery from at this time. EEG to evaluate for seizure Level of alertness: Awake, drowsy AEDs during EEG study: LEV Technical aspects: This EEG study was done with scalp electrodes positioned according to the 10-20 International system of electrode placement. Electrical activity was acquired at a sampling rate of 500Hz  and reviewed with a high frequency filter of 70Hz  and a low frequency filter of 1Hz . EEG data were recorded continuously and digitally stored. Description: No clear posterior dominant rhythm consists of 9-10 Hz activity of moderate voltage (25-35 uV) seen predominantly in posterior head regions, symmetric and reactive to eye opening and eye closing. There is an excessive amount of 15 to 18 Hz beta activity distributed symmetrically and diffusely. Physiologic photic driving was seen during photic stimulation.  Hyperventilation was not performed.   ABNORMALITY - Excessive beta, generalized IMPRESSION: This study is within normal limits. The excessive beta activity seen in the background is most likely due to the effect of benzodiazepine and is a benign EEG pattern. No seizures or epileptiform discharges were seen throughout the  recording. Priyanka Barbra Sarks      Subjective:  Patient denies any complaints currently, no seizure since admission, good appetite and oral intake, denies any focal deficits, eager for discharge. Discharge Exam: Vitals:   08/01/21 0337 08/01/21 0801  BP: 112/81 113/77  Pulse: 70 73  Resp: 15 (!) 21  Temp: 98 F (36.7 C) 98 F (36.7 C)  SpO2: 96% 99%   Vitals:   07/31/21 2036 07/31/21 2332 08/01/21 [id number] 08/01/21 0801  BP: 113/80 114/72 112/81 113/77  Pulse: 80 78 70 73  Resp: 19 17 15  (!) 21  Temp: 97.9 F (36.6 C) 98 F (36.7 C) 98 F (36.7 C) 98 F (36.7 C)  TempSrc: Oral Oral Oral Oral  SpO2: 94% 95% 96% 99%  Weight:      Height:        General: Pt is alert, awake, not in acute distress, left eyebrow sutured, no bleed or discharge Cardiovascular: RRR, S1/S2 +, no rubs, no gallops Respiratory: CTA bilaterally, no wheezing, no rhonchi Abdominal: Soft, NT, ND, bowel sounds + Extremities: no edema, no cyanosis    The results of significant diagnostics from this hospitalization (including imaging, microbiology, ancillary and laboratory) are listed below for reference.     Microbiology: Recent Results (from the past 240 hour(s))  C Difficile Quick Screen w PCR reflex     Status: None   Collection Time: 07/23/21  3:40 PM   Specimen: STOOL  Result Value Ref Range Status   C Diff antigen NEGATIVE NEGATIVE Final   C Diff toxin NEGATIVE NEGATIVE Final   C Diff interpretation No C. difficile detected.  Final    Comment: Performed at [facility], [address] [place], [place] [address]  Resp Panel by RT-PCR (Flu A&B, Covid) Nasopharyngeal Swab     Status: None   Collection Time: 07/31/21  8:38 AM   Specimen: Nasopharyngeal Swab; Nasopharyngeal(NP) swabs in vial transport medium  Result Value Ref Range Status   SARS Coronavirus 2 by RT PCR NEGATIVE NEGATIVE Final    Comment: (NOTE) SARS-CoV-2 target nucleic acids are NOT DETECTED.  The SARS-CoV-2 RNA is  generally detectable in upper respiratory specimens during the acute phase of infection. The lowest concentration of SARS-CoV-2 viral copies this assay can detect is 138 copies/mL. A negative result does not preclude SARS-Cov-2 infection and should not be used as the sole basis for treatment or other patient management decisions. A negative result may occur with  improper specimen collection/handling, submission of specimen other than nasopharyngeal swab, presence of viral mutation(s) within the areas targeted by this assay, and inadequate number of viral copies(<138 copies/mL). A negative result must be combined with clinical observations, patient history, and epidemiological information. The expected result is Negative.  Fact Sheet for Patients:  BloggerCourse.com  Fact Sheet for Healthcare Providers:  SeriousBroker.it  This test is no t yet approved or cleared by the [place] FDA and  has been authorized for detection and/or diagnosis of SARS-CoV-2 by FDA under an Emergency Use Authorization (EUA). This EUA will remain  in effect (meaning this test can be used) for the duration of the COVID-19 declaration under Section 564(b)(1) of the Act, 21 U.S.C.section 360bbb-3(b)(1), unless the authorization is terminated  or revoked sooner.       Influenza A by PCR NEGATIVE NEGATIVE Final   Influenza B by PCR NEGATIVE NEGATIVE Final    Comment: (NOTE) The Xpert Xpress SARS-CoV-2/FLU/RSV plus assay is intended as an aid in the diagnosis of influenza from Nasopharyngeal swab specimens and should not be used as a sole basis for treatment. Nasal washings and aspirates are unacceptable for Xpert Xpress SARS-CoV-2/FLU/RSV testing.  Fact Sheet for Patients: BloggerCourse.com  Fact Sheet for Healthcare Providers: SeriousBroker.it  This test is not yet approved or cleared by the [place] FDA and has been authorized for detection and/or diagnosis of SARS-CoV-2 by FDA under an Emergency Use Authorization (EUA). This EUA will remain in effect (meaning this  test can be used) for the duration of the COVID-19 declaration under Section 564(b)(1) of the Act, 21 U.S.C. section 360bbb-3(b)(1), unless the authorization is terminated or revoked.  Performed at [facility], [place] [address] [place], [place] [address]      Labs: BNP (last 3 results) No results for input(s): BNP in the last 8760 hours. Basic Metabolic Panel: Recent Labs  Lab 07/26/21 1543 07/30/21 2330 07/31/21 0837 07/31/21 1520  NA 137 132* 138 138  K 4.1 4.0 4.8 3.8  CL 105 104 105 105  CO2 24 22 27  21*  GLUCOSE 93 91 95 84  BUN 13 17 15 10   CREATININE 1.02 1.10 1.13 1.06  CALCIUM 9.2 8.6* 9.3 8.9   Liver Function Tests: Recent Labs  Lab 07/26/21 1543 07/30/21 2330  AST 21 31  ALT 23 23  ALKPHOS 96 85  BILITOT 1.2 1.3*  PROT 7.2 7.0  ALBUMIN 4.2 4.1   No results for input(s): LIPASE, AMYLASE in the last 168 hours. No results for input(s): AMMONIA in the last 168 hours. CBC: Recent Labs  Lab 07/26/21 1543 07/30/21 2330 07/31/21 0900 07/31/21 1520  WBC 5.5 6.9 6.5 7.3  NEUTROABS 2.5 4.3 4.9 5.4  HGB 15.8 16.0 15.3 16.5  HCT 46.9 47.7 45.7 49.4  MCV 95.7 96.0 98.3 96.5  PLT 201 192 150 184   Cardiac Enzymes: No results for input(s): CKTOTAL, CKMB, CKMBINDEX, TROPONINI in the last 168 hours. BNP: Invalid input(s): POCBNP CBG: No results for input(s): GLUCAP in the last 168 hours. D-Dimer No results for input(s): DDIMER in the last 72 hours. Hgb A1c No results for input(s): HGBA1C in the last 72 hours. Lipid Profile No results for input(s): CHOL, HDL, LDLCALC, TRIG, CHOLHDL, LDLDIRECT in the last 72 hours. Thyroid function studies No results for input(s): TSH, T4TOTAL, T3FREE, THYROIDAB in the last 72 hours.  Invalid input(s): FREET3 Anemia work  up No results for input(s): VITAMINB12, FOLATE, FERRITIN, TIBC, IRON, RETICCTPCT in the last 72 hours. Urinalysis    Component Value Date/Time   COLORURINE YELLOW 07/30/2021 2357   APPEARANCEUR CLEAR 07/30/2021 2357   LABSPEC 1.024 07/30/2021 2357   PHURINE 5.0 07/30/2021 2357   GLUCOSEU NEGATIVE 07/30/2021 2357   HGBUR NEGATIVE 07/30/2021 2357   BILIRUBINUR NEGATIVE 07/30/2021 2357   BILIRUBINUR negative 09/25/2014 1641   [place]arian[place] Colon 07/30/2021 2357   PROTEINUR NEGATIVE 07/30/2021 2357   UROBILINOGEN 4.0 09/25/2014 1641   UROBILINOGEN 0.2 08/19/2014 1152   NITRITE NEGATIVE 07/30/2021 2357   LEUKOCYTESUR NEGATIVE 07/30/2021 2357   Sepsis Labs Invalid input(s): PROCALCITONIN,  WBC,  LACTICIDVEN Microbiology Recent Results (from the past 240 hour(s))  C Difficile Quick Screen w PCR reflex     Status: None   Collection Time: 07/23/21  3:40 PM   Specimen: STOOL  Result Value Ref Range Status   C Diff antigen NEGATIVE NEGATIVE Final   C Diff toxin NEGATIVE NEGATIVE Final   C Diff interpretation No C. difficile detected.  Final    Comment: Performed at [facility], [place] [address] [place], [place] [address]  Resp Panel by RT-PCR (Flu A&B, Covid) Nasopharyngeal Swab     Status: None   Collection Time: 07/31/21  8:38 AM   Specimen: Nasopharyngeal Swab; Nasopharyngeal(NP) swabs in vial transport medium  Result Value Ref Range Status   SARS Coronavirus 2 by RT PCR NEGATIVE NEGATIVE Final    Comment: (NOTE) SARS-CoV-2 target nucleic acids are NOT DETECTED.  The SARS-CoV-2 RNA is  generally detectable in upper respiratory specimens during the acute phase of infection. The lowest concentration of SARS-CoV-2 viral copies this assay can detect is 138 copies/mL. A negative result does not preclude SARS-Cov-2 infection and should not be used as the sole basis for treatment or other patient management decisions. A negative result may occur with  improper specimen  collection/handling, submission of specimen other than nasopharyngeal swab, presence of viral mutation(s) within the areas targeted by this assay, and inadequate number of viral copies(<138 copies/mL). A negative result must be combined with clinical observations, patient history, and epidemiological information. The expected result is Negative.  Fact Sheet for Patients:  EntrepreneurPulse.com.au  Fact Sheet for Healthcare Providers:  IncredibleEmployment.be  This test is no t yet approved or cleared by the [place] FDA and  has been authorized for detection and/or diagnosis of SARS-CoV-2 by FDA under an Emergency Use Authorization (EUA). This EUA will remain  in effect (meaning this test can be used) for the duration of the COVID-19 declaration under Section 564(b)(1) of the Act, 21 U.S.C.section 360bbb-3(b)(1), unless the authorization is terminated  or revoked sooner.       Influenza A by PCR NEGATIVE NEGATIVE Final   Influenza B by PCR NEGATIVE NEGATIVE Final    Comment: (NOTE) The Xpert Xpress SARS-CoV-2/FLU/RSV plus assay is intended as an aid in the diagnosis of influenza from Nasopharyngeal swab specimens and should not be used as a sole basis for treatment. Nasal washings and aspirates are unacceptable for Xpert Xpress SARS-CoV-2/FLU/RSV testing.  Fact Sheet for Patients: EntrepreneurPulse.com.au  Fact Sheet for Healthcare Providers: IncredibleEmployment.be  This test is not yet approved or cleared by the [place] FDA and has been authorized for detection and/or diagnosis of SARS-CoV-2 by FDA under an Emergency Use Authorization (EUA). This EUA will remain in effect (meaning this test can be used) for the duration of the COVID-19 declaration under Section 564(b)(1) of the Act, 21 U.S.C. section 360bbb-3(b)(1), unless the authorization is terminated or revoked.  Performed at [facility], [place] [address] [place], [place] [address]      Time coordinating discharge: Over 30 minutes  SIGNED:   Phillips Climes, MD  [facility] 08/01/2021, 10:53 AM Pager   If 7PM-7AM, please contact night-coverage [email or web address] Password [email or web address]

## 2021-08-01 NOTE — Discharge Instructions (Signed)
Follow with Primary MD   Get CBC, CMP,  checked  by Primary MD next visit.    Activity: As tolerated with Full fall precautions use walker/cane & assistance as needed   Disposition [facility]   Diet: Regular   On your next visit with your primary care physician please Get Medicines reviewed and adjusted.   Please request your Prim.MD to go over all Hospital Tests and Procedure/Radiological results at the follow up, please get all Hospital records sent to your Prim MD by signing hospital release before you go home.   If you experience worsening of your admission symptoms, develop shortness of breath, life threatening emergency, suicidal or homicidal thoughts you must seek medical attention immediately by calling 911 or calling your MD immediately  if symptoms less severe.  You Must read complete instructions/literature along with all the possible adverse reactions/side effects for all the Medicines you take and that have been prescribed to you. Take any new Medicines after you have completely understood and accpet all the possible adverse reactions/side effects.   Do not drive, operating heavy machinery, perform activities at heights, swimming or participation in water activities or provide baby sitting services if your were admitted for syncope or siezures until you have seen by Primary MD or a Neurologist and advised to do so again.  Do not drive when taking Pain medications.    Do not take more than prescribed Pain, Sleep and Anxiety Medications  Special Instructions: If you have smoked or chewed Tobacco  in the last 2 yrs please stop smoking, stop any regular Alcohol  and or any Recreational drug use.  Wear Seat belts while driving.   Please note  You were cared for by a hospitalist during your hospital stay. If you have any questions about your discharge medications or the care you received while you were in the hospital after you are discharged, you can call the unit and  asked to speak with the hospitalist on call if the hospitalist that took care of you is not available. Once you are discharged, your primary care physician will handle any further medical issues. Please note that NO REFILLS for any discharge medications will be authorized once you are discharged, as it is imperative that you return to your primary care physician (or establish a relationship with a primary care physician if you do not have one) for your aftercare needs so that they can reassess your need for medications and monitor your lab values.

## 2021-08-01 NOTE — Care Management (Signed)
°  Transition of Care (TOC) Screening Note   Patient Details  Name: Justin [name] Date of Birth: Dec 30, 1980   Transition of Care [facility]) CM/SW Contact:    Lawerance Sabal, RN Phone Number: 08/01/2021, 9:02 AM    Transition of Care Department [facility]) has reviewed patient and we will continue to monitor patient advancement through interdisciplinary progression rounds. If new patient transition needs arise, please place a TOC consult.

## 2021-08-01 NOTE — Progress Notes (Signed)
Neurology Progress Note   S:// No seizures since admission MRI and EEG completed   O:// Current vital signs: BP 113/77 (BP Location: Right Arm)    Pulse 73    Temp 98 F (36.7 C) (Oral)    Resp (!) 21    Ht 5\' 11"  (1.803 m)    Wt 93 kg    SpO2 99%    BMI 28.60 kg/m  Vital signs in last 24 hours: Temp:  [97.9 F (36.6 C)-98.7 F (37.1 C)] 98 F (36.7 C) (01/29 0801) Pulse Rate:  [70-97] 73 (01/29 0801) Resp:  [11-21] 21 (01/29 0801) BP: (112-129)/(72-95) 113/77 (01/29 0801) SpO2:  [94 %-100 %] 99 % (01/29 0801) Weight:  [93 kg] 93 kg (01/28 1527) GENERAL: Awake, alert, in no acute distress Psych: Affect appropriate for situation, patient is calm and cooperative with examination Head: Normocephalic, laceration which is sutured in the left eyebrow EENT: Normal conjunctivae, dry mucous membranes, no OP obstruction LUNGS: Normal respiratory effort. Non-labored breathing on room air CV: Regular rate and rhythm on telemetry ABDOMEN: Soft, non-tender, non-distended Extremities: warm, well perfused, without obvious deformity Neurological exam Awake alert oriented x3 No evidence of dysarthria or aphasia Cranial nerves II through XII intact Motor system examination with no weakness noted Sensory examination with no deficits Coordination examination with no dysmetria DTRs 2+ Unchanged exam from yesterday  Medications  Current Facility-Administered Medications:    0.9 %  sodium chloride infusion, , Intravenous, Continuous, 08-04-1987, MD, Last Rate: 25 mL/hr at 07/31/21 1610, Infusion Verify at 07/31/21 1610   acetaminophen (TYLENOL) tablet 650 mg, 650 mg, Oral, Q6H PRN, 650 mg at 08/01/21 0853 **OR** acetaminophen (TYLENOL) suppository 650 mg, 650 mg, Rectal, Q6H PRN, 08/03/21, MD   bictegravir-emtricitabine-tenofovir AF (BIKTARVY) 50-200-25 MG per tablet 1 tablet, 1 tablet, Oral, Daily, Bobette Mo, MD, 1 tablet at 08/01/21 08/03/21   diphenhydrAMINE  (BENADRYL) capsule 50 mg, 50 mg, Oral, QHS PRN, Chotiner, 1314, MD, 50 mg at 07/31/21 2148   ibuprofen (ADVIL) tablet 600 mg, 600 mg, Oral, Q6H PRN, 2149, MD   levETIRAcetam (KEPPRA) tablet 1,000 mg, 1,000 mg, Oral, BID, Bobette Mo, MD, 1,000 mg at 08/01/21 08/03/21   loperamide (IMODIUM) capsule 2 mg, 2 mg, Oral, QID PRN, 3888, MD   LORazepam (ATIVAN) injection 2 mg, 2 mg, Intravenous, Q4H PRN, Bobette Mo, MD   melatonin tablet 5 mg, 5 mg, Oral, QHS, Chotiner, Milon Dikes, MD, 5 mg at 07/31/21 2149   ondansetron (ZOFRAN) tablet 4 mg, 4 mg, Oral, Q6H PRN **OR** ondansetron (ZOFRAN) injection 4 mg, 4 mg, Intravenous, Q6H PRN, 2150, MD Labs CBC    Component Value Date/Time   WBC 7.3 07/31/2021 1520   RBC 5.12 07/31/2021 1520   HGB 16.5 07/31/2021 1520   HCT 49.4 07/31/2021 1520   PLT 184 07/31/2021 1520   MCV 96.5 07/31/2021 1520   MCH 32.2 07/31/2021 1520   MCHC 33.4 07/31/2021 1520   RDW 11.9 07/31/2021 1520   LYMPHSABS 1.3 07/31/2021 1520   MONOABS 0.5 07/31/2021 1520   EOSABS 0.0 07/31/2021 1520   BASOSABS 0.0 07/31/2021 1520    CMP     Component Value Date/Time   NA 138 07/31/2021 1520   K 3.8 07/31/2021 1520   CL 105 07/31/2021 1520   CO2 21 (L) 07/31/2021 1520   GLUCOSE 84 07/31/2021 1520   BUN 10 07/31/2021 1520   CREATININE 1.06 07/31/2021 1520  CREATININE 1.16 02/09/2021 0916   CALCIUM 8.9 07/31/2021 1520   PROT 7.0 07/30/2021 2330   ALBUMIN 4.1 07/30/2021 2330   AST 31 07/30/2021 2330   ALT 23 07/30/2021 2330   ALKPHOS 85 07/30/2021 2330   BILITOT 1.3 (H) 07/30/2021 2330   GFRNONAA >60 07/31/2021 1520   GFRNONAA 80 11/27/2020 1038   GFRAA 92 11/27/2020 1038    Imaging I have reviewed images in epic and the results pertinent to this consultation are: MR brain with and without contrast-no acute changes  EEG: Normal   Assessment: 41 year old man with history of seizure disorder, HIV, appetitive C,  syphilis, endocarditis with multiple breakthrough seizures with question of medication compliance in the past but reports good compliance now. Also has substance abuse issues for which she is in recovery and rehab at this time. Due to multiplicity of seizures, was admitted for further observation MRI of the brain unremarkable for any acute process or evidence of opportunistic infection. EEG is also normal Unclear cause of breakthrough seizures  Recommendations: Home dose of Keppra 1000 twice daily-increased to 1250 twice daily. Maintain seizure precautions as below Follow-up with neurology in 4 to 6 weeks Management of HIV and other medical conditions per primary team Plan discussed with Dr. Randol Kern on the unit.    -- Milon Dikes, MD Neurologist [facility] Pager: [phone]   SEIZURE PRECAUTIONS Per [facility] statutes, patients with seizures are not allowed to drive until they have been seizure-free for six months.   Use caution when using heavy equipment or power tools. Avoid working on ladders or at heights. Take showers instead of baths. Ensure the water temperature is not too high on the home water heater. Do not go swimming alone. Do not lock yourself in a room alone (i.e. bathroom). When caring for infants or small children, sit down when holding, feeding, or changing them to minimize risk of injury to the child in the event you have a seizure. Maintain good sleep hygiene. Avoid alcohol.    If patient has another seizure, call 911 and bring them back to the ED if: A.  The seizure lasts longer than 5 minutes.      B.  The patient doesn't wake shortly after the seizure or has new problems such as difficulty seeing, speaking or moving following the seizure C.  The patient was injured during the seizure D.  The patient has a temperature over 102 F (39C) E.  The patient vomited during the seizure and now is having trouble breathing

## 2021-08-02 ENCOUNTER — Other Ambulatory Visit: Payer: Self-pay | Admitting: Internal Medicine

## 2021-08-02 MED ORDER — LEVETIRACETAM 250 MG PO TABS: 1250.0000 mg | ORAL_TABLET | Freq: Two times a day (BID) | ORAL | 5 refills | Status: DC

## 2021-08-06 ENCOUNTER — Encounter: Payer: Self-pay | Admitting: Internal Medicine

## 2021-08-06 ENCOUNTER — Other Ambulatory Visit: Payer: Self-pay

## 2021-08-06 DIAGNOSIS — Z79899 Other long term (current) drug therapy: Secondary | ICD-10-CM

## 2021-08-06 DIAGNOSIS — Z113 Encounter for screening for infections with a predominantly sexual mode of transmission: Secondary | ICD-10-CM

## 2021-08-06 DIAGNOSIS — B20 Human immunodeficiency virus [HIV] disease: Secondary | ICD-10-CM

## 2021-08-09 LAB — T-HELPER CELLS (CD4) COUNT (NOT AT ARMC)
Absolute CD4: 765 cells/uL (ref 490–1740)
CD4 T Helper %: 31 % (ref 30–61)
Total lymphocyte count: 2468 cells/uL (ref 850–3900)

## 2021-08-09 LAB — HIV-1 RNA QUANT-NO REFLEX-BLD
HIV 1 RNA Quant: NOT DETECTED Copies/mL
HIV-1 RNA Quant, Log: NOT DETECTED Log cps/mL

## 2021-08-09 LAB — LIPID PANEL
Cholesterol: 147 mg/dL (ref <200)
HDL: 52 mg/dL (ref >=40)
LDL Cholesterol (Calc): 82 mg/dL (calc)
Non-HDL Cholesterol (Calc): 95 mg/dL (calc) (ref <130)
Total CHOL/HDL Ratio: 2.8 (calc) (ref <5.0)
Triglycerides: 47 mg/dL (ref <150)

## 2021-08-10 ENCOUNTER — Encounter (HOSPITAL_COMMUNITY): Payer: Self-pay

## 2021-08-10 ENCOUNTER — Other Ambulatory Visit: Payer: Self-pay

## 2021-08-10 ENCOUNTER — Emergency Department (HOSPITAL_COMMUNITY)
Admission: EM | Admit: 2021-08-10 | Discharge: 2021-08-10 | Disposition: A | Discharge status: Home / Self Care | Payer: Self-pay | Attending: Emergency Medicine | Admitting: Emergency Medicine

## 2021-08-10 DIAGNOSIS — X58XXXD Exposure to other specified factors, subsequent encounter: Secondary | ICD-10-CM | POA: Insufficient documentation

## 2021-08-10 DIAGNOSIS — Z4802 Encounter for removal of sutures: Secondary | ICD-10-CM | POA: Insufficient documentation

## 2021-08-10 DIAGNOSIS — S01112D Laceration without foreign body of left eyelid and periocular area, subsequent encounter: Secondary | ICD-10-CM | POA: Insufficient documentation

## 2021-08-10 MED ORDER — LEVETIRACETAM 500 MG PO TABS
1250.0000 mg | ORAL_TABLET | Freq: Once | ORAL | Status: AC
  Administered 2021-08-10: 1250 mg via ORAL
  Filled 2021-08-10: qty 1

## 2021-08-10 NOTE — Discharge Instructions (Signed)
Please continue to monitor your symptoms closely.  If you develop any new or worsening symptoms please come back to the emergency department. 

## 2021-08-10 NOTE — ED Provider Notes (Signed)
[facility]-EMERGENCY DEPT Provider Note   CSN: [id number] Arrival date & time: 08/10/21  0542     History  Chief Complaint  Patient presents with   Suture / Staple Removal    Justin [name] is a 41 y.o. male.  HPI Patient is a 41 year old male who presents to the emergency department for suture removal.  Patient had 4 fast-absorbing gut sutures placed above the left eye on January 27.  He states that they have not yet dissolved so he came in today for suture removal.  Also notes that he has a history of seizures and is on 1250 mg of Keppra twice daily.  States he has been compliant with this medicine but is due for his morning dose around 6 AM.  States that he feels as if he is having more "auras" recently and is concerned that he could possibly be close to having a seizure.    Home Medications Prior to Admission medications   Medication Sig Start Date End Date Taking? Authorizing Provider  bictegravir-emtricitabine-tenofovir AF (BIKTARVY) 50-200-25 MG TABS tablet Take 1 tablet by mouth daily. 06/30/21   [name], MD  citalopram (CELEXA) 20 MG tablet Take 20 mg by mouth daily. 06/30/21   [provider]  dicyclomine (BENTYL) 20 MG tablet Take 1 tablet (20 mg total) by mouth 2 (two) times daily. 07/26/21   Curatolo, Adam, DO  ibuprofen (ADVIL) 800 MG tablet Take 1 tablet (800 mg total) by mouth every 8 (eight) hours as needed for fever, headache, mild pain, moderate pain or cramping. 08/01/21   Elgergawy, Leana Roe, MD  levETIRAcetam (KEPPRA) 250 MG tablet Take 5 tablets (1,250 mg total) by mouth 2 (two) times daily. 08/02/21   Comer, Belia Heman, MD  loperamide (IMODIUM) 2 MG capsule Take 1 capsule (2 mg total) by mouth 4 (four) times daily as needed for diarrhea or loose stools. 07/26/21   Curatolo, Adam, DO  penicillin v potassium (VEETID) 500 MG tablet Take 1 tablet (500 mg total) by mouth 3 (three) times daily. 07/30/21   Pollyann Savoy, MD       Allergies    Patient has no known allergies.    Review of Systems   Review of Systems  Skin:  Positive for wound. Negative for color change.  Neurological:  Negative for weakness.  Psychiatric/Behavioral:  Negative for confusion.    Physical Exam Updated Vital Signs BP 127/88 (BP Location: Left Arm)    Pulse (!) 106    Resp 16    SpO2 99%  Physical Exam Vitals and nursing note reviewed.  Constitutional:      General: He is not in acute distress.    Appearance: Normal appearance. He is not ill-appearing, toxic-appearing or diaphoretic.  HENT:     Head: Normocephalic and atraumatic.     Comments: Well-healing 2.5 cm laceration noted to the left eyebrow.  No erythema or edema.  No fluctuance.  No increased warmth.  No drainage from the wound.    Right Ear: External ear normal.     Left Ear: External ear normal.     Nose: Nose normal.     Mouth/Throat:     Mouth: Mucous membranes are moist.     Pharynx: Oropharynx is clear. No oropharyngeal exudate or posterior oropharyngeal erythema.  Eyes:     Extraocular Movements: Extraocular movements intact.  Cardiovascular:     Rate and Rhythm: Normal rate and regular rhythm.     Pulses: Normal  pulses.     Heart sounds: Normal heart sounds. No murmur heard.   No friction rub. No gallop.  Pulmonary:     Effort: Pulmonary effort is normal. No respiratory distress.     Breath sounds: Normal breath sounds. No stridor. No wheezing, rhonchi or rales.  Abdominal:     General: Abdomen is flat.     Tenderness: There is no abdominal tenderness.  Musculoskeletal:        General: Normal range of motion.     Cervical back: Normal range of motion and neck supple. No tenderness.  Skin:    General: Skin is warm and dry.  Neurological:     General: No focal deficit present.     Mental Status: He is alert and oriented to person, place, and time.     Comments: Moving all 4 extremities with ease.  No gross deficits.  Speaking clearly, coherently,  and in complete sentences.  A&O x3.  Psychiatric:        Mood and Affect: Mood normal.        Behavior: Behavior normal.   ED Results / Procedures / Treatments   Labs (all labs ordered are listed, but only abnormal results are displayed) Labs Reviewed - No data to display  EKG None  Radiology No results found.  Procedures .Suture Removal  Date/Time: 08/10/2021 6:11 AM Performed by: Placido Sou, PA-C Authorized by: Placido Sou, PA-C   Consent:    Consent obtained:  Verbal   Consent given by:  Patient   Risks, benefits, and alternatives were discussed: yes     Risks discussed:  Pain Universal protocol:    Procedure explained and questions answered to patient or proxy's satisfaction: yes     Relevant documents present and verified: yes     Test results available: yes     Imaging studies available: yes     Required blood products, implants, devices, and special equipment available: yes     Site/side marked: yes     Immediately prior to procedure, a time out was called: yes     Patient identity confirmed:  Verbally with patient and arm band Location:    Location:  Head/neck   Head/neck location:  Eyebrow   Eyebrow location:  L eyebrow Procedure details:    Wound appearance:  No signs of infection, good wound healing and clean   Number of sutures removed:  4 Post-procedure details:    Procedure completion:  Tolerated well, no immediate complications   Medications Ordered in ED Medications  levETIRAcetam (KEPPRA) tablet 1,250 mg (1,250 mg Oral Given 08/10/21 9735)    ED Course/ Medical Decision Making/ A&P                           Medical Decision Making  Patient is a 41 year old male who presents to the emergency department for suture removal.  Patient initially had a 4, fast-absorbing gut sutures placed in the left eyebrow.  These did not dissolve so he came in today for suture removal.  Sutures were easily removed.  No signs of infection.  Patient also  notes that he has recently been having more auras.  He is concerned that he could be at risk for having another seizure.  Denies any further seizures since his last hospitalization.  Denies any drug or alcohol use.  Appears to be moving all 4 extremities with ease.  No gross deficits.  A&O x3.  Speaking clearly and  coherently.  States he has been compliant with his Keppra but is due for his morning dose.  This has been ordered.  Feel the patient is stable for discharge at this time and he is agreeable.  He states he is working to schedule an appointment with neurology.  Urged him to do so.  Discussed return precautions.  His questions were answered and he was amicable at the time of discharge.  Final Clinical Impression(s) / ED Diagnoses Final diagnoses:  Visit for suture removal   Rx / DC Orders ED Discharge Orders     None         Placido Sou, PA-C 08/10/21 0617    Zadie Rhine, MD 08/10/21 [id number]

## 2021-08-10 NOTE — ED Notes (Signed)
Oral temp would not register

## 2021-08-10 NOTE — ED Triage Notes (Signed)
Pt wants to have his sutures removed that were placed 7-10 days ago to his left eyebrow.

## 2021-08-16 ENCOUNTER — Telehealth: Payer: Self-pay

## 2021-08-16 ENCOUNTER — Other Ambulatory Visit: Payer: Self-pay | Admitting: Internal Medicine

## 2021-08-16 MED ORDER — CITALOPRAM HYDROBROMIDE 20 MG PO TABS: 20.0000 mg | ORAL_TABLET | Freq: Every day | ORAL | 5 refills | Status: DC

## 2021-08-16 NOTE — Telephone Encounter (Signed)
Patient came in office today to see dental today. Patient stated he took his last dose of Celexa today and is requesting a refill. I do not see that we have filled this for him before. Patient is now requesting a refill on this before he leave town for 3 months. Patient also has a follow up appointment with you on 08/19/21, but insisted that I request for his refill today. Please advise.

## 2021-08-19 ENCOUNTER — Encounter: Payer: Self-pay | Admitting: Internal Medicine

## 2021-08-19 ENCOUNTER — Ambulatory Visit (INDEPENDENT_AMBULATORY_CARE_PROVIDER_SITE_OTHER): Payer: Self-pay | Admitting: Internal Medicine

## 2021-08-19 ENCOUNTER — Other Ambulatory Visit: Payer: Self-pay

## 2021-08-19 VITALS — BP 133/63 | HR 75 | Temp 98.2°F | Wt 202.6 lb

## 2021-08-19 DIAGNOSIS — F323 Major depressive disorder, single episode, severe with psychotic features: Secondary | ICD-10-CM

## 2021-08-19 DIAGNOSIS — R569 Unspecified convulsions: Secondary | ICD-10-CM

## 2021-08-19 DIAGNOSIS — B20 Human immunodeficiency virus [HIV] disease: Secondary | ICD-10-CM

## 2021-08-19 DIAGNOSIS — F15959 Other stimulant use, unspecified with stimulant-induced psychotic disorder, unspecified: Secondary | ICD-10-CM

## 2021-08-19 NOTE — Progress Notes (Signed)
° °  Subjective:    Patient ID: Justin [name], male    DOB: 09/06/1980, 41 y.o.   MRN: [id number]  HPI Here for follow up of HIV He continues on Biktarvy with no missed doses.  CD4 765 and viral load remains not detected.  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  He is going to [place] for a job for 3 months soon.  Asking about his Celexa and Keppra.     Review of Systems  Constitutional:  Negative for fatigue.  Gastrointestinal:  Negative for diarrhea and nausea.  Skin:  Negative for rash.  Psychiatric/Behavioral:  Negative for dysphoric mood.       Objective:   Physical Exam Eyes:     General: No scleral icterus. Skin:    Findings: No rash.  Neurological:     General: No focal deficit present.     Mental Status: He is alert.  Psychiatric:        Mood and Affect: Mood normal.   SH: remains drug-free       Assessment & Plan:

## 2021-08-19 NOTE — Assessment & Plan Note (Signed)
I have refilled his celexa and he will continue and get into mental health care when he returns.  He just completed a substance abuse program.

## 2021-08-19 NOTE — Assessment & Plan Note (Signed)
He is on Keppra and I have refilled this.  He will need to get established with a neurologist

## 2021-08-19 NOTE — Assessment & Plan Note (Signed)
He remains drug free and feeling well.

## 2021-08-19 NOTE — Assessment & Plan Note (Signed)
He continues to do well on his regimen and will continue.  Follow up in 4 months after his return to the area.

## 2021-08-28 ENCOUNTER — Emergency Department (HOSPITAL_COMMUNITY)
Admission: EM | Admit: 2021-08-28 | Discharge: 2021-08-28 | Disposition: A | Discharge status: Home / Self Care | Payer: Self-pay | Attending: Emergency Medicine | Admitting: Emergency Medicine

## 2021-08-28 ENCOUNTER — Other Ambulatory Visit: Payer: Self-pay

## 2021-08-28 ENCOUNTER — Encounter (HOSPITAL_COMMUNITY): Payer: Self-pay

## 2021-08-28 DIAGNOSIS — R569 Unspecified convulsions: Secondary | ICD-10-CM | POA: Insufficient documentation

## 2021-08-28 DIAGNOSIS — Z79899 Other long term (current) drug therapy: Secondary | ICD-10-CM | POA: Insufficient documentation

## 2021-08-28 LAB — CBC WITH DIFFERENTIAL/PLATELET
Abs Immature Granulocytes: 0.01 10*3/uL (ref 0.00–0.07)
Basophils Absolute: 0 10*3/uL (ref 0.0–0.1)
Basophils Relative: 0 %
Eosinophils Absolute: 0.1 10*3/uL (ref 0.0–0.5)
Eosinophils Relative: 1 %
HCT: 44.1 % (ref 39.0–52.0)
Hemoglobin: 14.8 g/dL (ref 13.0–17.0)
Immature Granulocytes: 0 %
Lymphocytes Relative: 21 %
Lymphs Abs: 1 10*3/uL (ref 0.7–4.0)
MCH: 32.8 pg (ref 26.0–34.0)
MCHC: 33.6 g/dL (ref 30.0–36.0)
MCV: 97.8 fL (ref 80.0–100.0)
Monocytes Absolute: 0.6 10*3/uL (ref 0.1–1.0)
Monocytes Relative: 14 %
Neutro Abs: 2.9 10*3/uL (ref 1.7–7.7)
Neutrophils Relative %: 64 %
Platelets: 148 10*3/uL — ABNORMAL LOW (ref 150–400)
RBC: 4.51 MIL/uL (ref 4.22–5.81)
RDW: 12.3 % (ref 11.5–15.5)
WBC: 4.7 10*3/uL (ref 4.0–10.5)
nRBC: 0 % (ref 0.0–0.2)

## 2021-08-28 LAB — COMPREHENSIVE METABOLIC PANEL
ALT: 31 U/L (ref 0–44)
AST: 31 U/L (ref 15–41)
Albumin: 3.9 g/dL (ref 3.5–5.0)
Alkaline Phosphatase: 71 U/L (ref 38–126)
Anion gap: 7 (ref 5–15)
BUN: 17 mg/dL (ref 6–20)
CO2: 26 mmol/L (ref 22–32)
Calcium: 8.8 mg/dL — ABNORMAL LOW (ref 8.9–10.3)
Chloride: 105 mmol/L (ref 98–111)
Creatinine, Ser: 1.07 mg/dL (ref 0.61–1.24)
GFR, Estimated: >60 mL/min (ref >60)
Glucose, Bld: 108 mg/dL — ABNORMAL HIGH (ref 70–99)
Potassium: 3.9 mmol/L (ref 3.5–5.1)
Sodium: 138 mmol/L (ref 135–145)
Total Bilirubin: 1.1 mg/dL (ref 0.3–1.2)
Total Protein: 7 g/dL (ref 6.5–8.1)

## 2021-08-28 LAB — ETHANOL: Alcohol, Ethyl (B): <10 mg/dL (ref <10)

## 2021-08-28 MED ORDER — LEVETIRACETAM IN NACL 1000 MG/100ML IV SOLN
1000.0000 mg | Freq: Two times a day (BID) | INTRAVENOUS | Status: DC
  Administered 2021-08-28: 1000 mg via INTRAVENOUS
  Filled 2021-08-28: qty 100

## 2021-08-28 MED ORDER — SODIUM CHLORIDE 0.9 % IV BOLUS
1000.0000 mL | Freq: Once | INTRAVENOUS | Status: AC
  Administered 2021-08-28: 1000 mL via INTRAVENOUS

## 2021-08-28 NOTE — Discharge Instructions (Addendum)
As discussed, your evaluation today has been largely reassuring.  But, it is important that you monitor your condition carefully, and do not hesitate to return to the ED if you develop new, or concerning changes in your condition. ? ?Otherwise, please follow-up with your physician for appropriate ongoing care. ? ?

## 2021-08-28 NOTE — ED Triage Notes (Signed)
Patient BIB [facility] from home. Patient got up to get water, acted strange, stiff, had a seizure. Roommate called. Alert and confused on EMS arrival. Patient had another seizure in route. EMS 2.5 midazolam IV given.   EMS vitals 117/86 HR 76 100% Room air
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
## 2021-09-01 ENCOUNTER — Ambulatory Visit: Payer: Self-pay | Admitting: Internal Medicine

## 2021-09-20 ENCOUNTER — Emergency Department (HOSPITAL_COMMUNITY)
Admission: EM | Admit: 2021-09-20 | Discharge: 2021-09-21 | Disposition: A | Discharge status: Home / Self Care | Payer: Self-pay | Attending: Emergency Medicine | Admitting: Emergency Medicine

## 2021-09-20 ENCOUNTER — Encounter (HOSPITAL_COMMUNITY): Payer: Self-pay

## 2021-09-20 ENCOUNTER — Other Ambulatory Visit: Payer: Self-pay

## 2021-09-20 DIAGNOSIS — Z046 Encounter for general psychiatric examination, requested by authority: Secondary | ICD-10-CM | POA: Insufficient documentation

## 2021-09-20 DIAGNOSIS — R451 Restlessness and agitation: Secondary | ICD-10-CM | POA: Insufficient documentation

## 2021-09-20 DIAGNOSIS — S0990XA Unspecified injury of head, initial encounter: Secondary | ICD-10-CM | POA: Insufficient documentation

## 2021-09-20 DIAGNOSIS — T148XXA Other injury of unspecified body region, initial encounter: Secondary | ICD-10-CM

## 2021-09-20 DIAGNOSIS — Z21 Asymptomatic human immunodeficiency virus [HIV] infection status: Secondary | ICD-10-CM | POA: Insufficient documentation

## 2021-09-20 DIAGNOSIS — S01112A Laceration without foreign body of left eyelid and periocular area, initial encounter: Secondary | ICD-10-CM | POA: Insufficient documentation
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
## 2021-09-21 ENCOUNTER — Encounter (HOSPITAL_COMMUNITY): Payer: Self-pay

## 2021-09-21 ENCOUNTER — Emergency Department (HOSPITAL_COMMUNITY): Payer: Self-pay

## 2021-09-21 LAB — CBC WITH DIFFERENTIAL/PLATELET
Abs Immature Granulocytes: 0.02 10*3/uL (ref 0.00–0.07)
Basophils Absolute: 0 10*3/uL (ref 0.0–0.1)
Basophils Relative: 0 %
Eosinophils Absolute: 0 10*3/uL (ref 0.0–0.5)
Eosinophils Relative: 0 %
HCT: 49.8 % (ref 39.0–52.0)
Hemoglobin: 17 g/dL (ref 13.0–17.0)
Immature Granulocytes: 0 %
Lymphocytes Relative: 27 %
Lymphs Abs: 1.5 10*3/uL (ref 0.7–4.0)
MCH: 32.3 pg (ref 26.0–34.0)
MCHC: 34.1 g/dL (ref 30.0–36.0)
MCV: 94.5 fL (ref 80.0–100.0)
Monocytes Absolute: 0.6 10*3/uL (ref 0.1–1.0)
Monocytes Relative: 10 %
Neutro Abs: 3.4 10*3/uL (ref 1.7–7.7)
Neutrophils Relative %: 63 %
Platelets: 223 10*3/uL (ref 150–400)
RBC: 5.27 MIL/uL (ref 4.22–5.81)
RDW: 12.1 % (ref 11.5–15.5)
WBC: 5.6 10*3/uL (ref 4.0–10.5)
nRBC: 0 % (ref 0.0–0.2)

## 2021-09-21 LAB — COMPREHENSIVE METABOLIC PANEL
ALT: 28 U/L (ref 0–44)
AST: 46 U/L — ABNORMAL HIGH (ref 15–41)
Albumin: 4.8 g/dL (ref 3.5–5.0)
Alkaline Phosphatase: 76 U/L (ref 38–126)
Anion gap: 13 (ref 5–15)
BUN: 23 mg/dL — ABNORMAL HIGH (ref 6–20)
CO2: 21 mmol/L — ABNORMAL LOW (ref 22–32)
Calcium: 9.8 mg/dL (ref 8.9–10.3)
Chloride: 102 mmol/L (ref 98–111)
Creatinine, Ser: 1.52 mg/dL — ABNORMAL HIGH (ref 0.61–1.24)
GFR, Estimated: 59 mL/min — ABNORMAL LOW (ref >60)
Glucose, Bld: 110 mg/dL — ABNORMAL HIGH (ref 70–99)
Potassium: 3.3 mmol/L — ABNORMAL LOW (ref 3.5–5.1)
Sodium: 136 mmol/L (ref 135–145)
Total Bilirubin: 3.1 mg/dL — ABNORMAL HIGH (ref 0.3–1.2)
Total Protein: 8.4 g/dL — ABNORMAL HIGH (ref 6.5–8.1)

## 2021-09-21 LAB — ETHANOL: Alcohol, Ethyl (B): <10 mg/dL (ref <10)

## 2021-09-21 MED ORDER — SODIUM CHLORIDE 0.9 % IV BOLUS
1000.0000 mL | Freq: Once | INTRAVENOUS | Status: AC
  Administered 2021-09-21: 1000 mL via INTRAVENOUS

## 2021-09-21 NOTE — ED Notes (Signed)
Tolerated ambulation trial
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
## 2021-09-21 NOTE — ED Notes (Signed)
Awake and talking taking po's ?
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
## 2021-09-21 NOTE — ED Notes (Signed)
Ate a sandwich and a cup of water. ?
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
## 2021-10-05 ENCOUNTER — Emergency Department (HOSPITAL_COMMUNITY)
Admission: EM | Admit: 2021-10-05 | Discharge: 2021-10-05 | Disposition: A | Discharge status: Home / Self Care | Payer: Self-pay | Attending: Student | Admitting: Student

## 2021-10-05 ENCOUNTER — Other Ambulatory Visit: Payer: Self-pay

## 2021-10-05 ENCOUNTER — Emergency Department (HOSPITAL_COMMUNITY): Payer: Self-pay

## 2021-10-05 ENCOUNTER — Encounter (HOSPITAL_COMMUNITY): Payer: Self-pay | Admitting: Emergency Medicine

## 2021-10-05 DIAGNOSIS — M79671 Pain in right foot: Secondary | ICD-10-CM | POA: Insufficient documentation

## 2021-10-05 DIAGNOSIS — R7989 Other specified abnormal findings of blood chemistry: Secondary | ICD-10-CM

## 2021-10-05 DIAGNOSIS — Z79899 Other long term (current) drug therapy: Secondary | ICD-10-CM | POA: Insufficient documentation

## 2021-10-05 DIAGNOSIS — Z21 Asymptomatic human immunodeficiency virus [HIV] infection status: Secondary | ICD-10-CM | POA: Insufficient documentation

## 2021-10-05 DIAGNOSIS — M7989 Other specified soft tissue disorders: Secondary | ICD-10-CM | POA: Insufficient documentation

## 2021-10-05 LAB — CBC WITH DIFFERENTIAL/PLATELET
Abs Immature Granulocytes: 0.03 10*3/uL (ref 0.00–0.07)
Basophils Absolute: 0 10*3/uL (ref 0.0–0.1)
Basophils Relative: 1 %
Eosinophils Absolute: 0.1 10*3/uL (ref 0.0–0.5)
Eosinophils Relative: 1 %
HCT: 47.8 % (ref 39.0–52.0)
Hemoglobin: 15.6 g/dL (ref 13.0–17.0)
Immature Granulocytes: 1 %
Lymphocytes Relative: 48 %
Lymphs Abs: 2.9 10*3/uL (ref 0.7–4.0)
MCH: 33.1 pg (ref 26.0–34.0)
MCHC: 32.6 g/dL (ref 30.0–36.0)
MCV: 101.5 fL — ABNORMAL HIGH (ref 80.0–100.0)
Monocytes Absolute: 0.5 10*3/uL (ref 0.1–1.0)
Monocytes Relative: 8 %
Neutro Abs: 2.5 10*3/uL (ref 1.7–7.7)
Neutrophils Relative %: 41 %
Platelets: 195 10*3/uL (ref 150–400)
RBC: 4.71 MIL/uL (ref 4.22–5.81)
RDW: 12.4 % (ref 11.5–15.5)
WBC: 6 10*3/uL (ref 4.0–10.5)
nRBC: 0 % (ref 0.0–0.2)

## 2021-10-05 LAB — HEPATIC FUNCTION PANEL
ALT: 26 U/L (ref 0–44)
AST: 26 U/L (ref 15–41)
Albumin: 3.5 g/dL (ref 3.5–5.0)
Alkaline Phosphatase: 63 U/L (ref 38–126)
Bilirubin, Direct: 0.3 mg/dL — ABNORMAL HIGH (ref 0.0–0.2)
Indirect Bilirubin: 1.2 mg/dL — ABNORMAL HIGH (ref 0.3–0.9)
Total Bilirubin: 1.5 mg/dL — ABNORMAL HIGH (ref 0.3–1.2)
Total Protein: 6.1 g/dL — ABNORMAL LOW (ref 6.5–8.1)

## 2021-10-05 LAB — URINALYSIS, ROUTINE W REFLEX MICROSCOPIC
Bacteria, UA: NONE SEEN
Bilirubin Urine: NEGATIVE
Glucose, UA: NEGATIVE mg/dL
Ketones, ur: NEGATIVE mg/dL
Leukocytes,Ua: NEGATIVE
Nitrite: NEGATIVE
Protein, ur: NEGATIVE mg/dL
Specific Gravity, Urine: 1.014 (ref 1.005–1.030)
pH: 5 (ref 5.0–8.0)

## 2021-10-05 LAB — PROTIME-INR
INR: 1.1 (ref 0.8–1.2)
Prothrombin Time: 13.8 seconds (ref 11.4–15.2)

## 2021-10-05 LAB — RAPID URINE DRUG SCREEN, HOSP PERFORMED
Amphetamines: NOT DETECTED
Barbiturates: NOT DETECTED
Benzodiazepines: NOT DETECTED
Cocaine: NOT DETECTED
Opiates: NOT DETECTED
Tetrahydrocannabinol: NOT DETECTED

## 2021-10-05 LAB — BASIC METABOLIC PANEL
Anion gap: 10 (ref 5–15)
BUN: 13 mg/dL (ref 6–20)
CO2: 26 mmol/L (ref 22–32)
Calcium: 9.7 mg/dL (ref 8.9–10.3)
Chloride: 107 mmol/L (ref 98–111)
Creatinine, Ser: 1.36 mg/dL — ABNORMAL HIGH (ref 0.61–1.24)
GFR, Estimated: >60 mL/min (ref >60)
Glucose, Bld: 89 mg/dL (ref 70–99)
Potassium: 5.2 mmol/L — ABNORMAL HIGH (ref 3.5–5.1)
Sodium: 143 mmol/L (ref 135–145)

## 2021-10-05 LAB — ETHANOL: Alcohol, Ethyl (B): <10 mg/dL (ref <10)

## 2021-10-05 LAB — SEDIMENTATION RATE: Sed Rate: 3 mm/hr (ref 0–16)

## 2021-10-05 LAB — LACTIC ACID, PLASMA
Lactic Acid, Venous: 5.9 mmol/L (ref 0.5–1.9)
Lactic Acid, Venous: 0.8 mmol/L (ref 0.5–1.9)

## 2021-10-05 LAB — C-REACTIVE PROTEIN: CRP: <0.5 mg/dL (ref <1.0)

## 2021-10-05 MED ORDER — LACTATED RINGERS IV BOLUS
1000.0000 mL | Freq: Once | INTRAVENOUS | Status: AC
  Administered 2021-10-05: 1000 mL via INTRAVENOUS
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
## 2021-10-06 ENCOUNTER — Telehealth: Payer: Self-pay

## 2021-10-06 ENCOUNTER — Other Ambulatory Visit: Payer: Self-pay | Admitting: Internal Medicine

## 2021-10-06 DIAGNOSIS — R569 Unspecified convulsions: Secondary | ICD-10-CM

## 2021-10-06 LAB — GC/CHLAMYDIA PROBE AMP (~~LOC~~) NOT AT ARMC
Chlamydia: NEGATIVE
Comment: NEGATIVE
Comment: NORMAL
Neisseria Gonorrhea: NEGATIVE

## 2021-10-06 MED ORDER — LEVETIRACETAM 250 MG PO TABS: 250.0000 mg | ORAL_TABLET | Freq: Two times a day (BID) | ORAL | 5 refills | Status: DC

## 2021-10-06 NOTE — Telephone Encounter (Signed)
Patient called office today stating that he received a different dosage for his Levetiracetam tablets. States he usually takes 5 250 tablets twice a day,. But pharmacy filled 1000 mg twice a day tablet this time.  ?Patient requested office follow up with pharmacy regarding prescription.  ? ?Spoke with pharmacist who states that patient did not have any refills on 250 mg kepra prescription. Dispensed 500 mg two caps BID kepra instead.  ?Will forward message to MD to advise if okay to send in refills for the 250 mg kepra prescription. ?Juanita Laster, RMA ? ? ?

## 2021-10-07 NOTE — Telephone Encounter (Signed)
Left voicemail with patient update. ?Leatrice Jewels, RMA ? ?

## 2021-10-20 ENCOUNTER — Ambulatory Visit: Payer: Self-pay | Admitting: Internal Medicine

## 2021-10-28 ENCOUNTER — Encounter (HOSPITAL_COMMUNITY): Payer: Self-pay

## 2021-10-28 ENCOUNTER — Observation Stay (HOSPITAL_COMMUNITY): Payer: Self-pay

## 2021-10-28 ENCOUNTER — Other Ambulatory Visit: Payer: Self-pay

## 2021-10-28 ENCOUNTER — Observation Stay (HOSPITAL_COMMUNITY)
Admission: EM | Admit: 2021-10-28 | Discharge: 2021-10-29 | Disposition: A | Discharge status: Home / Self Care | Payer: Self-pay | Attending: Student in an Organized Health Care Education/Training Program | Admitting: Student in an Organized Health Care Education/Training Program

## 2021-10-28 DIAGNOSIS — B20 Human immunodeficiency virus [HIV] disease: Secondary | ICD-10-CM | POA: Insufficient documentation

## 2021-10-28 DIAGNOSIS — Z87891 Personal history of nicotine dependence: Secondary | ICD-10-CM | POA: Insufficient documentation

## 2021-10-28 DIAGNOSIS — G40901 Epilepsy, unspecified, not intractable, with status epilepticus: Principal | ICD-10-CM | POA: Insufficient documentation

## 2021-10-28 DIAGNOSIS — R569 Unspecified convulsions: Secondary | ICD-10-CM

## 2021-10-28 DIAGNOSIS — G40909 Epilepsy, unspecified, not intractable, without status epilepticus: Secondary | ICD-10-CM

## 2021-10-28 LAB — COMPREHENSIVE METABOLIC PANEL
ALT: 36 U/L (ref 0–44)
AST: 46 U/L — ABNORMAL HIGH (ref 15–41)
Albumin: 4.5 g/dL (ref 3.5–5.0)
Alkaline Phosphatase: 81 U/L (ref 38–126)
Anion gap: 10 (ref 5–15)
BUN: 17 mg/dL (ref 6–20)
CO2: 22 mmol/L (ref 22–32)
Calcium: 9.2 mg/dL (ref 8.9–10.3)
Chloride: 105 mmol/L (ref 98–111)
Creatinine, Ser: 1.53 mg/dL — ABNORMAL HIGH (ref 0.61–1.24)
GFR, Estimated: 59 mL/min — ABNORMAL LOW (ref >60)
Glucose, Bld: 106 mg/dL — ABNORMAL HIGH (ref 70–99)
Potassium: 4.4 mmol/L (ref 3.5–5.1)
Sodium: 137 mmol/L (ref 135–145)
Total Bilirubin: 2.1 mg/dL — ABNORMAL HIGH (ref 0.3–1.2)
Total Protein: 7.5 g/dL (ref 6.5–8.1)

## 2021-10-28 LAB — ETHANOL: Alcohol, Ethyl (B): <10 mg/dL (ref <10)

## 2021-10-28 LAB — CBC
HCT: 50.5 % (ref 39.0–52.0)
Hemoglobin: 16.9 g/dL (ref 13.0–17.0)
MCH: 33.1 pg (ref 26.0–34.0)
MCHC: 33.5 g/dL (ref 30.0–36.0)
MCV: 98.8 fL (ref 80.0–100.0)
Platelets: 187 10*3/uL (ref 150–400)
RBC: 5.11 MIL/uL (ref 4.22–5.81)
RDW: 12 % (ref 11.5–15.5)
WBC: 8.4 10*3/uL (ref 4.0–10.5)
nRBC: 0 % (ref 0.0–0.2)

## 2021-10-28 LAB — RAPID URINE DRUG SCREEN, HOSP PERFORMED
Amphetamines: NOT DETECTED
Barbiturates: NOT DETECTED
Benzodiazepines: NOT DETECTED
Cocaine: NOT DETECTED
Opiates: NOT DETECTED
Tetrahydrocannabinol: POSITIVE — AB

## 2021-10-28 LAB — CBG MONITORING, ED: Glucose-Capillary: 105 mg/dL — ABNORMAL HIGH (ref 70–99)

## 2021-10-28 MED ORDER — ACETAMINOPHEN 650 MG RE SUPP: 650.0000 mg | Freq: Four times a day (QID) | RECTAL | Status: DC | PRN

## 2021-10-28 MED ORDER — ACETAMINOPHEN 325 MG PO TABS
650.0000 mg | ORAL_TABLET | Freq: Four times a day (QID) | ORAL | Status: DC | PRN
Start: 2021-10-28 — End: 2021-10-29

## 2021-10-28 MED ORDER — LORAZEPAM 2 MG/ML IJ SOLN: 4.0000 mg | INTRAMUSCULAR | Status: DC | PRN

## 2021-10-28 MED ORDER — LEVETIRACETAM IN NACL 1000 MG/100ML IV SOLN
1000.0000 mg | Freq: Once | INTRAVENOUS | Status: AC
  Administered 2021-10-28: 1000 mg via INTRAVENOUS
  Filled 2021-10-28: qty 100

## 2021-10-28 MED ORDER — BICTEGRAVIR-EMTRICITAB-TENOFOV 50-200-25 MG PO TABS
1.0000 | ORAL_TABLET | Freq: Every day | ORAL | Status: DC
  Administered 2021-10-29: 1 via ORAL
  Filled 2021-10-28 (×2): qty 1

## 2021-10-28 MED ORDER — LEVETIRACETAM 250 MG PO TABS
250.0000 mg | ORAL_TABLET | Freq: Two times a day (BID) | ORAL | Status: DC
  Administered 2021-10-29: 250 mg via ORAL
  Filled 2021-10-28: qty 1

## 2021-10-28 MED ORDER — LORAZEPAM 2 MG/ML IJ SOLN
2.0000 mg | Freq: Once | INTRAMUSCULAR | Status: AC
Start: 2021-10-28 — End: 2021-10-28
  Administered 2021-10-28: 2 mg via INTRAVENOUS
  Filled 2021-10-28: qty 1

## 2021-10-28 MED ORDER — ENOXAPARIN SODIUM 40 MG/0.4ML IJ SOSY
40.0000 mg | PREFILLED_SYRINGE | INTRAMUSCULAR | Status: DC
  Administered 2021-10-28: 40 mg via SUBCUTANEOUS
  Filled 2021-10-28: qty 0.4

## 2021-10-28 MED ORDER — LACTATED RINGERS IV BOLUS
1000.0000 mL | Freq: Once | INTRAVENOUS | Status: AC
Start: 2021-10-28 — End: 2021-10-28
  Administered 2021-10-28: 1000 mL via INTRAVENOUS

## 2021-10-28 MED ORDER — LORAZEPAM 2 MG/ML IJ SOLN
4.0000 mg | INTRAMUSCULAR | Status: DC | PRN
  Administered 2021-10-28: 4 mg via INTRAVENOUS
  Filled 2021-10-28: qty 2

## 2021-10-28 NOTE — Progress Notes (Signed)
EEG complete - results pending 

## 2021-10-28 NOTE — H&P (Signed)
? ?NAME:  Justin [name] Street, MRN:  [id number], DOB:  08-26-1980, LOS: 0 ?ADMISSION DATE:  10/28/2021, Primary: Pcp, No  ?CHIEF COMPLAINT:  seizure  ? ?Medical Service: Internal Medicine Teaching Service    ?     ?Attending Physician: Dr. Oswaldo DoneVincent, Marquita Palmsuncan Thomas, *    ?First Contact: Dr. Burnice LoganGawaluck Pager: [phone]  ?Second Contact: Dr. Marijo ConceptionBraswell Pager: [phone]  ?     ?After Hours (After 5p/  First Contact Pager: [phone]  ?weekends / holidays): Second Contact Pager: [phone]  ? ? ?History of present illness   ?Justin [name] Bills is a [age] yom with a seizure disorder who was brought into [facility] 10/28/21 by EMS after his mother witnessed him having a seizure this morning. Pt is post ictal and unable to contribute much to history taking.  I personally called and spoke with patient's mother, with whom he resides with.  She was a primary contributor of this HPI. ? ?Patient has been residing with his mother for about 30 days.  She notes that he was in his usual state of health up until this morning.  He had to take the day off of work due to needing to appear for court.  He is getting ready, at which time his mother noticed that he was making abnormal facial movements and would not respond to her.  This went on for about 5 minutes, after which time he went and sat on his bed and complained of a excruciating headache and vomited.  He was confused at that time.  EMS had been called and upon their arrival, he had a another seizure-like activity that his mother describes as him rolling around in bed.  He EMS report notes that this lasted about 45 seconds.  No loss of bowel or bladder function.  He remained encephalopathic following the second episode and was combative with EMS for a bridge, after which time he calm down. ? ?She reports that he is adherent to his medication.  His last seizure was about a month ago, according to his mom.  She believes that he has been absent of illicit substances for at least the last 90 days.  She denies  any known recent head trauma or new medications.  She reports that he has been expressing happiness with getting a new job and she does not think that he has been stressed recently. ? ?He has had ~6 ED visits in the past 6 months for seizures.  ? ?Past Medical History  ?He,  has a past medical history of Chronic hepatitis C without hepatic coma (HCC) (08/17/2017), Endocarditis, Hepatitis C, History of syphilis (08/25/2014), HIV (human immunodeficiency virus infection) (HCC), HIV (human immunodeficiency virus infection) (HCC), and Seizures (HCC).  ? ?Home Medications    ? ?Prior to Admission medications   ?Medication Sig Start Date End Date Taking? Authorizing Provider  ?levETIRAcetam (KEPPRA) 500 MG tablet Take 500 mg by mouth 2 (two) times daily. 10/05/21  Yes [provider]  ?bictegravir-emtricitabine-tenofovir AF (BIKTARVY) 50-200-25 MG TABS tablet Take 1 tablet by mouth daily. 06/30/21   Gardiner Barefootomer, Robert W, MD  ?chlorhexidine (PERIDEX) 0.12 % solution SMARTSIG:By Mouth 10/21/21   [provider]  ?citalopram (CELEXA) 20 MG tablet Take 1 tablet (20 mg total) by mouth daily. ?Patient not taking: Reported on 09/21/2021 08/16/21   Gardiner Barefootomer, Robert W, MD  ?dicyclomine (BENTYL) 20 MG tablet Take 1 tablet (20 mg total) by mouth 2 (two) times daily. ?Patient not taking: Reported on 09/21/2021 07/26/21   Curatolo,  Adam, DO  ?ibuprofen (ADVIL) 800 MG tablet Take 1 tablet (800 mg total) by mouth every 8 (eight) hours as needed for fever, headache, mild pain, moderate pain or cramping. ?Patient not taking: Reported on 09/21/2021 08/01/21   Elgergawy, Leana Roe, MD  ?levETIRAcetam (KEPPRA) 250 MG tablet Take 1 tablet (250 mg total) by mouth 2 (two) times daily. 10/06/21   [name], MD  ?loperamide (IMODIUM) 2 MG capsule Take 1 capsule (2 mg total) by mouth 4 (four) times daily as needed for diarrhea or loose stools. ?Patient not taking: Reported on 09/21/2021 07/26/21   [name], DO  ? ? ?Allergies   ? ?Allergies  as of 10/28/2021  ? (No Known Allergies)  ? ? ?Social History  ? reports that he has quit smoking. His smoking use included cigarettes. He has never used smokeless tobacco. He reports that he does not currently use alcohol. He reports that he does not currently use drugs after having used the following drugs: Marijuana and Methamphetamines.  ? ?Family History   ?His family history includes Diabetes type II in his mother. He was adopted.  ? ?Objective   ?Blood pressure 112/75, pulse 88, temperature 99 ?F (37.2 ?C), temperature source Oral, resp. rate 16, height 5\' 11"  (1.803 m), weight 90.7 kg, SpO2 99 %. ?   ?General: young male resting comfortably in bed ?HEENT: head atraumatic, no visible tongue laceration ?Cardiac: RRR, no murmurs ?Pulm: lungs clear throughout ?GI: soft, non-tender ?Skin: small abrasions on the bilateral lower extremities without surrounding erythema or edema ? ?Neurologic exam ?Mental status: Somnolent.  Difficult to arouse to voice but will awaken to physical stimuli.  Inconsistently follows commands ?Speech: There is difficult to get him to speak however the brief things that he did say did not seem dysarthric. ?Cranial nerves: Pupils equal round and reactive to light.  No facial droop. ?Motor: 5 out of 5 strength in the bilateral upper and lower extremities.  No tremor or asterixis ?Sensory: Could not be performed ?Cerebellar: Could not be performed ?Gait: Deferred ?Significant Diagnostic Tests:  ? ? ? ?  Latest Ref Rng & Units 10/28/2021  ?  9:04 AM 10/05/2021  ?  5:29 PM 09/20/2021  ? 11:55 PM  ?CBC  ?WBC 4.0 - 10.5 K/uL 8.4   6.0   5.6    ?Hemoglobin 13.0 - 17.0 g/dL 09/22/2021   12.4   58.0    ?Hematocrit 39.0 - 52.0 % 50.5   47.8   49.8    ?Platelets 150 - 400 K/uL 187   195   223    ? ? ?  Latest Ref Rng & Units 10/28/2021  ?  9:04 AM 10/05/2021  ?  5:29 PM 09/20/2021  ? 11:55 PM  ?BMP  ?Glucose 70 - 99 mg/dL 09/22/2021   89   338    ?BUN 6 - 20 mg/dL 17   13   23     ?Creatinine 0.61 - 1.24 mg/dL 250      5.39    ?Sodium 135 - 145 mmol/L 137   143   136    ?Potassium 3.5 - 5.1 mmol/L 4.4   5.2   3.3    ?Chloride 98 - 111 mmol/L 105   107   102    ?CO2 22 - 32 mmol/L 22   26   21     ?Calcium 8.9 - 10.3 mg/dL 9.2   9.7   9.8    ? ? ?Summary  ?  40 yom with a seizure disorder complicated by medication non-compliance who was brought to the ED via EMS for status epilepticus  ? ?Assessment & Plan:  ?Principal Problem: ?  Seizure (HCC) ? ?Status epilepticus ?Seizure disorder ?AKI vs CKD ?Hx of recurrent ED visits for seizures due to medication non-compliance ?Hx of Polysubstance abuse ? ?Discussion:  ?Based on his mother's account of events, it does not sound like he recovered between the initial and second episodes, thus making this status epilepticus. Appears to have resolved however will obtain EEG to confirm. ?History from patient is unreliable at this time due to his post-ictal state however he does elude to some missed keppra doses prior to seizure onset. Medication non-compliance appears to have been a long standing issue however I'm unsure if this has been delved into any deeper in the past as to the reasoning. We will check a keppra level to see if he is therapeutic--if levels are therapeutic, this will suggest breakthrough seizures rather than non-adherence.  ?The most likely alternative or additional inciter could be substance abuse/withdrawal based on his history, so we will check his alcohol level and UDS.  ?No significant electrolyte derangements or signs of infection.  ? ?Plan ?Admit to progressive ?Received keppra load in the ED. Additional dosing per neurology recs.  ?Ativan 4mg  IV PRN for seizure--PAGE MD STAT ?Check keppra, ETOH levels and UDS ?Stat head CT to r/o ICH given preceding headache ?EEG ?Seizure precautions ?NPO for now--can have regular diet when mentation improves ?IV fluid challenge 1L--recheck renal function in AM. If no improvement, may need further renal pathology may need to be  further investigated ?Will need to establish with neurology after discharge ? ?HIV--well controlled. RNA undetected; CD4 765 on 08/2021 labs. continue bictarvy ? ?Chronic hepatitis C. Likely explains mild li

## 2021-10-28 NOTE — ED Notes (Signed)
This RN received a phone call from pt.'s mom stating the pt. Was making clicking noises and that usually happens right before he seizes. This RN went to check on pt and noticed drool, pt was minimally responsive. MD made aware and IV ativan was administered. Pt is post-ictal at this time  ?

## 2021-10-28 NOTE — Consult Note (Addendum)
NEUROLOGY CONSULTATION NOTE  ? ?Date of service: October 28, 2021 ?Patient Name: Justin HarderQuadyr D Justin [name] ?MRN:  [id number] ?DOB:  1980/11/21 ?Reason for consult: "breakthrough seizure" ?Requesting Provider: Tyson [name], Justin [name], * ? ?History of Present Illness  ?Justin [name] is a 41 y.o. male with PMH seizures, HIV who presented to [facility] (10/28/2021) for seizure after missing a dose of keppra. ? ?No family at bedside.  ? ?Per chart, patient had a 45sec seizure at home, witnessed by mom. Before, patient was making clicking noises. ?Patient is still sleepy, received keppra and ativan this AM. Reported missing a few doses of keppra. ? ?ROS  ? ?Constitutional Denies weight loss, fever and chills.   ?HEENT Denies changes in vision and hearing.   ?MSK Sore muscles  ?Neurological B/l headache  ? ?Past History  ? ?Past Medical History:  ?Diagnosis Date  ?? Chronic hepatitis C without hepatic coma (HCC) 08/17/2017  ?? Endocarditis   ?? Hepatitis C   ?? History of syphilis 08/25/2014  ? Treated by [facility] 07-2014.  ?? HIV (human immunodeficiency virus infection) (HCC)   ?? HIV (human immunodeficiency virus infection) (HCC)   ?? Seizures (HCC)   ? ?Past Surgical History:  ?Procedure Laterality Date  ?? hand surgery Right   ?? LUMBAR PUNCTURE  08/08/2019  ?    ?? WRIST SURGERY    ? ?Family History  ?Adopted: Yes  ?Problem Relation Age of Onset  ?? Diabetes type II Mother   ? ?Social History  ? ?Socioeconomic History  ?? Marital status: Single  ?  Spouse name: Not on file  ?? Number of children: Not on file  ?? Years of education: Not on file  ?? Highest education level: Not on file  ?Occupational History  ?? Not on file  ?Tobacco Use  ?? Smoking status: Former  ?  Types: Cigarettes  ?? Smokeless tobacco: Never  ?Vaping Use  ?? Vaping Use: Never used  ?Substance and Sexual Activity  ?? Alcohol use: Not Currently  ?  Comment: in rehab  ?? Drug use: Not Currently  ?  Types: Marijuana, Methamphetamines  ?  Comment: in rehab  ?? Sexual  activity: Yes  ?  Partners: Male  ?  Birth control/protection: Condom  ?  Comment: given condoms  ?Other Topics Concern  ?? Not on file  ?Social History Narrative  ? ** Merged History Encounter **  ?    ? ** Merged History Encounter **  ?    ? ** Merged History Encounter **  ?    ? ?Social Determinants of Health  ? ?Financial Resource Strain: Not on file  ?Food Insecurity: Not on file  ?Transportation Needs: Not on file  ?Physical Activity: Not on file  ?Stress: Not on file  ?Social Connections: Not on file  ? ?No Known Allergies ? ?Medications  ?(Not in a hospital admission) ?  ? ?Vitals  ? ?Vitals:  ? 10/28/21 1100 10/28/21 1200 10/28/21 1230 10/28/21 1345  ?BP: 115/75 112/75 104/66 111/68  ?Pulse: 83 88 84 92  ?Resp: (!) 22 16 19 19   ?Temp:      ?TempSrc:      ?SpO2: 97% 99% 97% 97%  ?Weight:      ?Height:      ?  ? ?Body mass index is 27.89 kg/m?. ? ?Physical Exam  ?General: Laying comfortably in bed; in no acute distress.  ?Pulmonary: Symmetric chest rise. Non-labored respiratory effort. On [place] ?Ext: No cyanosis, edema, or deformity.  ?Musculoskeletal: Normal  digits and nails by inspection. No clubbing.  ? ?Neurologic Examination  ?Mental status/Cognition:  ?Sedated on ativan and keppra load, will wake to answer questions ?Oriented to self, place. Initially June 2024, then corrected to April 2023 ?Attention declined to answer  ?Speech/language:  ?Fluent, thought content appropriate.  ?Comprehension intact-able to follow 3 step commands without difficulty.  ?Object naming and repetition deferred given post-ictal and sedated ? ?Cranial Nerves: ?II: No VF deficits.  ?III,IV, VI: PERRL. No gaze preference or deviation. Ptosis not present, EOMI bilaterally. No nystagmus  ?V: Facial light touch sensation normal bilaterally ?VII: Face symmetric at rest and smiling. No nasolabial fold flattening  ?VIII: Hearing normal bilaterally ?IX,X: Cough intact.   ?XI: Bilateral shoulder shrug and head turn ?XII: Midline tongue  extension ?Motor: ?No drift ?R  UE 5/5 LE 5/5  ?L UE 5/5 LE 5/5  ?Normal tone throughout. Normal bulk throughout.  ?No atrophy noted ? ?Sensory: Pinprick and light touch intact throughout bilaterally ? ?Reflexes: ? Right Left Comments  ? Biceps (C5/6) 1 1   ? Patellar (L3/4) 1 1   ? Achilles (S1) 1 1   ? ?Coordination/Complex Motor:  ?- Declined to participate ?- Gait: deferred ? ?Labs  ? ?CBC:  ?Recent Labs  ?Lab 10/28/21 ?0904  ?WBC 8.4  ?HGB 16.9  ?HCT 50.5  ?MCV 98.8  ?PLT 187  ? ? ?Basic Metabolic Panel:  ?Lab Results  ?Component Value Date  ? NA 137 10/28/2021  ? K 4.4 10/28/2021  ? CO2 22 10/28/2021  ? GLUCOSE 106 (H) 10/28/2021  ? BUN 17 10/28/2021  ? CREATININE 1.53 (H) 10/28/2021  ? CALCIUM 9.2 10/28/2021  ? GFRNONAA 59 (L) 10/28/2021  ? GFRAA 92 11/27/2020  ? ?Lipid Panel:  ?Lab Results  ?Component Value Date  ? LDLCALC 82 08/06/2021  ? ?HgbA1c:  ?Lab Results  ?Component Value Date  ? HGBA1C 4.7 (L) 04/18/2020  ? ?Urine Drug Screen:  ?   ?Component Value Date/Time  ? LABOPIA NONE DETECTED 10/05/2021 2054  ? COCAINSCRNUR NONE DETECTED 10/05/2021 2054  ? LABBENZ NONE DETECTED 10/05/2021 2054  ? AMPHETMU NONE DETECTED 10/05/2021 2054  ? THCU NONE DETECTED 10/05/2021 2054  ? LABBARB NONE DETECTED 10/05/2021 2054  ?  ?Alcohol Level  ?   ?Component Value Date/Time  ? ETH <10 10/28/2021 1306  ? ?No results found. ?CT Head without contrast: ?Ordered, pending ? ?EEG routine: 10/28/2021 ?This study is within normal limits. No seizures or epileptiform discharges were seen throughout the recording. ?Impression  ?Justin [name] is a 41 y.o. male with PMH seizures, HIV who presented to [facility] (10/28/2021) for seizure after missing a dose of keppra.  ? ?Seizure 2/2 medication non-adherence.  ? ? ?Recommendations  ?- No change in Keppra dose, continue home dose ?- If insurance covers, consider switching to long acting keppra ?- Seizure precautions ?- No cEEG, rEEG completed ? ?Signed: ?Princess Bruins, DO ?Resident,  PGY-1 ?[facility] ?10/28/2021, 2:39 PM  ? ?I have seen the patient reviewed the above note.  He is mildly drowsy consistent with the Ativan that he received earlier.  He admits to missing more than one dose of Keppra yesterday, and therefore this represents seizures in the setting of medication noncompliance.  With his improving exam, and negative EEG, low suspicion for any ongoing activity that needs attention. ? ?He will need his home medications restarted, as long as he continues to improve to baseline, no further recommendations at this time, please call if  further concerns remain. ? ?Ritta Slot, MD ?[facility] ?[phone] ? ?If 7pm- 7am, please page neurology on call as listed in AMION. ? ?

## 2021-10-28 NOTE — ED Notes (Signed)
Pt was found standing at the cabinets and going through the drawers. Pt was disoriented to situation, place and time. Pt was redirected back in bed and fell back asleep.  ?

## 2021-10-28 NOTE — ED Triage Notes (Signed)
Pt BIB EMS from home after mom witnessed seizure, EMS reports total seizure when they arrived lasting about 45 seconds. Pt has hx of seizures and has been compliant w/meds. Pt is alert to self and place only at this time and is postictal  ?

## 2021-10-28 NOTE — ED Provider Notes (Signed)
?[name] ?Provider Note ? ? ?CSN: [id number] ?Arrival date & time: 10/28/21  0845 ? ?  ? ?History ? ?Chief Complaint  ?Patient presents with  ? Seizures  ? ? ?Justin [name] is a 41 y.o. male. ? ? ?Seizures ?Patient presents with seizures.  History of same.  Reportedly had witnessed 45-second seizure at home.  Still little postictal.Patient without injury.  Is on Keppra at 1200 mg twice a day.  History of HIV.  Patient states has been compliant with his Keppra but did not take it yet this morning ?  ? ?Home Medications ?Prior to Admission medications   ?Medication Sig Start Date End Date Taking? Authorizing Provider  ?bictegravir-emtricitabine-tenofovir AF (BIKTARVY) 50-200-25 MG TABS tablet Take 1 tablet by mouth daily. 06/30/21  Yes Comer, Belia Heman, MD  ?chlorhexidine (PERIDEX) 0.12 % solution SMARTSIG:By Mouth 10/21/21  Yes [provider]  ?levETIRAcetam (KEPPRA) 250 MG tablet Take 1 tablet (250 mg total) by mouth 2 (two) times daily. 10/06/21  Yes Comer, Belia Heman, MD  ?levETIRAcetam (KEPPRA) 500 MG tablet Take 500 mg by mouth 2 (two) times daily. 10/05/21  Yes [provider]  ?citalopram (CELEXA) 20 MG tablet Take 1 tablet (20 mg total) by mouth daily. ?Patient not taking: Reported on 09/21/2021 08/16/21   Justin Barefoot, MD  ?dicyclomine (BENTYL) 20 MG tablet Take 1 tablet (20 mg total) by mouth 2 (two) times daily. ?Patient not taking: Reported on 09/21/2021 07/26/21   [name], DO  ?ibuprofen (ADVIL) 800 MG tablet Take 1 tablet (800 mg total) by mouth every 8 (eight) hours as needed for fever, headache, mild pain, moderate pain or cramping. ?Patient not taking: Reported on 09/21/2021 08/01/21   Elgergawy, Leana Roe, MD  ?loperamide (IMODIUM) 2 MG capsule Take 1 capsule (2 mg total) by mouth 4 (four) times daily as needed for diarrhea or loose stools. ?Patient not taking: Reported on 09/21/2021 07/26/21   [name], DO  ?   ? ?Allergies    ?Patient  has no known allergies.   ? ?Review of Systems   ?Review of Systems  ?Neurological:  Positive for seizures.  ? ?Physical Exam ?Updated Vital Signs ?BP 103/61   Pulse 81   Temp 99 ?F (37.2 ?C) (Oral)   Resp 15   Ht 5\' 11"  (1.803 m)   Wt 90.7 kg   SpO2 100%   BMI 27.89 kg/m?  ?Physical Exam ?Vitals and nursing note reviewed.  ?HENT:  ?   Mouth/Throat:  ?   Comments: No injury to tongue ?Eyes:  ?   Pupils: Pupils are equal, round, and reactive to light.  ?Cardiovascular:  ?   Rate and Rhythm: Regular rhythm.  ?Pulmonary:  ?   Breath sounds: Normal breath sounds.  ?Abdominal:  ?   Tenderness: There is no abdominal tenderness.  ?Musculoskeletal:  ?   Cervical back: Neck supple.  ?Neurological:  ?   Mental Status: He is alert.  ?   Comments: Awake and pleasant but mild confusion  ? ? ?ED Results / Procedures / Treatments   ?Labs ?(all labs ordered are listed, but only abnormal results are displayed) ?Labs Reviewed  ?COMPREHENSIVE METABOLIC PANEL - Abnormal; Notable for the following components:  ?    Result Value  ? Glucose, Bld 106 (*)   ? Creatinine, Ser 1.53 (*)   ? AST 46 (*)   ? Total Bilirubin 2.1 (*)   ? GFR, Estimated 59 (*)   ?  All other components within normal limits  ?CBG MONITORING, ED - Abnormal; Notable for the following components:  ? Glucose-Capillary 105 (*)   ? All other components within normal limits  ?CBC  ?ETHANOL  ?LEVETIRACETAM LEVEL  ?RAPID URINE DRUG SCREEN, HOSP PERFORMED  ? ? ?EKG ?None ? ?Radiology ?CT HEAD WO CONTRAST (5MM) ? ?Result Date: 10/28/2021 ?CLINICAL DATA:  Headache, new or worsening, neuro deficit (Age 41-49y) EXAM: CT HEAD WITHOUT CONTRAST TECHNIQUE: Contiguous axial images were obtained from the base of the skull through the vertex without intravenous contrast. RADIATION DOSE REDUCTION: This exam was performed according to the departmental dose-optimization program which includes automated exposure control, adjustment of the mA and/or kV according to patient size and/or  use of iterative reconstruction technique. COMPARISON:  Head CT [id number].  Head CT and brain MRI 07/31/2021 FINDINGS: Brain: No evidence of acute infarction, hemorrhage, hydrocephalus, extra-axial collection or mass lesion/mass effect. Vascular: No hyperdense vessel or unexpected calcification. Skull: Normal. Negative for fracture or focal lesion. Sinuses/Orbits: No acute finding. Other: Mild scalp scarring in the left posterior vertex. IMPRESSION: No acute intracranial abnormality. Electronically Signed   By: Narda RutherfordMelanie  Sanford M.D.   On: 10/28/2021 16:08   ? ?Procedures ?Procedures  ? ? ?Medications Ordered in ED ?Medications  ?bictegravir-emtricitabine-tenofovir AF (BIKTARVY) 50-200-25 MG per tablet 1 tablet (1 tablet Oral Not Given 10/28/21 1424)  ?enoxaparin (LOVENOX) injection 40 mg (40 mg Subcutaneous Given 10/28/21 1459)  ?acetaminophen (TYLENOL) tablet 650 mg (has no administration in time range)  ?  Or  ?acetaminophen (TYLENOL) suppository 650 mg (has no administration in time range)  ?LORazepam (ATIVAN) injection 4 mg (has no administration in time range)  ?lactated ringers bolus 1,000 mL (has no administration in time range)  ?levETIRAcetam (KEPPRA) IVPB 1000 mg/100 mL premix (0 mg Intravenous Stopped 10/28/21 1111)  ?levETIRAcetam (KEPPRA) IVPB 1000 mg/100 mL premix (0 mg Intravenous Stopped 10/28/21 0933)  ?LORazepam (ATIVAN) injection 2 mg (2 mg Intravenous Given 10/28/21 1048)  ? ? ?ED Course/ Medical Decision Making/ A&P ?  ?                        ?Medical Decision Making ?Amount and/or Complexity of Data Reviewed ?Labs: ordered. ? ?Risk ?Prescription drug management. ?Decision regarding hospitalization. ? ? ?Patient presents after seizure.  History of seizures previously.  Initially will order awake but had a second episode while in the ER.  Had been loaded on 2 g of Keppra initially.  Some Ativan given after.  Discussed with Dr. Amada JupiterKirkpatrick from neurology.  Will be admitted to internal medicine.  Does  have a history of HIV but is not reassuring CD4 count and previous work-up with seizure to help rule out a central infectious cause.  Will be admitted. ? ?CRITICAL CARE ?Performed by: Benjiman CoreNathan Mackinley Kiehn ?Total critical care time: 30 minutes ?Critical care time was exclusive of separately billable procedures and treating other patients. ?Critical care was necessary to treat or prevent imminent or life-threatening deterioration. ?Critical care was time spent personally by me on the following activities: development of treatment plan with patient and/or surrogate as well as nursing, discussions with consultants, evaluation of patient's response to treatment, examination of patient, obtaining history from patient or surrogate, ordering and performing treatments and interventions, ordering and review of laboratory studies, ordering and review of radiographic studies, pulse oximetry and re-evaluation of patient's condition. ? ? ? ? ? ? ? ? ?Final Clinical Impression(s) / ED Diagnoses ?Final diagnoses:  ?Status  epilepticus (HCC)  ? ? ?Rx / DC Orders ?ED Discharge Orders   ? ? None  ? ?  ? ? ?  ?Benjiman Core, MD ?10/28/21 1617 ? ?

## 2021-10-28 NOTE — Procedures (Signed)
Patient Name: Justin [name]  ?MRN: [id number]  ?Epilepsy Attending: Charlsie Quest  ?Referring Physician/Provider: Rejeana Brock, MD ?Date: 10/28/2021 ?Duration: 26.13 mins ? ?Patient history: 41 yo M with a seizure disorder complicated by medication non-compliance who was brought to the ED via EMS after witnessed seizure.  EEG to evaluate for seizure ? ?Level of alertness: Awake, asleep ? ?AEDs during EEG study: LEV ? ?Technical aspects: This EEG study was done with scalp electrodes positioned according to the 10-20 International system of electrode placement. Electrical activity was acquired at a sampling rate of 500Hz  and reviewed with a high frequency filter of 70Hz  and a low frequency filter of 1Hz . EEG data were recorded continuously and digitally stored.  ? ?Description: The posterior dominant rhythm consists of 9 Hz activity of moderate voltage (25-35 uV) seen predominantly in posterior head regions, symmetric and reactive to eye opening and eye closing. Sleep was characterized by vertex waves, sleep spindles (12 to 14 Hz), maximal frontocentral region. Hyperventilation and photic stimulation were not performed.    ? ?IMPRESSION: ?This study is within normal limits. No seizures or epileptiform discharges were seen throughout the recording. ? ?  ? ?

## 2021-10-29 ENCOUNTER — Other Ambulatory Visit (HOSPITAL_COMMUNITY): Payer: Self-pay

## 2021-10-29 LAB — COMPREHENSIVE METABOLIC PANEL WITH GFR
ALT: 30 U/L (ref 0–44)
AST: 33 U/L (ref 15–41)
Albumin: 3.8 g/dL (ref 3.5–5.0)
Alkaline Phosphatase: 68 U/L (ref 38–126)
Anion gap: 7 (ref 5–15)
BUN: 12 mg/dL (ref 6–20)
CO2: 23 mmol/L (ref 22–32)
Calcium: 8.8 mg/dL — ABNORMAL LOW (ref 8.9–10.3)
Chloride: 105 mmol/L (ref 98–111)
Creatinine, Ser: 1.26 mg/dL — ABNORMAL HIGH (ref 0.61–1.24)
GFR, Estimated: >60 mL/min (ref >60)
Glucose, Bld: 75 mg/dL (ref 70–99)
Potassium: 3.8 mmol/L (ref 3.5–5.1)
Sodium: 135 mmol/L (ref 135–145)
Total Bilirubin: 2.3 mg/dL — ABNORMAL HIGH (ref 0.3–1.2)
Total Protein: 6.6 g/dL (ref 6.5–8.1)

## 2021-10-29 LAB — CBC
HCT: 45 % (ref 39.0–52.0)
Hemoglobin: 15 g/dL (ref 13.0–17.0)
MCH: 32.8 pg (ref 26.0–34.0)
MCHC: 33.3 g/dL (ref 30.0–36.0)
MCV: 98.5 fL (ref 80.0–100.0)
Platelets: 173 10*3/uL (ref 150–400)
RBC: 4.57 MIL/uL (ref 4.22–5.81)
RDW: 12 % (ref 11.5–15.5)
WBC: 6.4 10*3/uL (ref 4.0–10.5)
nRBC: 0 % (ref 0.0–0.2)

## 2021-10-29 LAB — LEVETIRACETAM LEVEL: Levetiracetam Lvl: 5.9 ug/mL — ABNORMAL LOW (ref 10.0–40.0)

## 2021-10-29 MED ORDER — LEVETIRACETAM 500 MG PO TABS
1250.0000 mg | ORAL_TABLET | Freq: Two times a day (BID) | ORAL | 0 refills | Status: DC
Start: 2021-10-29 — End: 2021-11-22
  Filled 2021-10-29: qty 150, 30d supply, fill #0

## 2021-10-29 MED ORDER — BIKTARVY 50-200-25 MG PO TABS
1.0000 | ORAL_TABLET | Freq: Every day | ORAL | 0 refills | Status: DC
  Filled 2021-10-29 – 2021-11-03 (×2): qty 30, 30d supply, fill #0

## 2021-10-29 NOTE — TOC Benefit Eligibility Note (Signed)
Patient Advocate Encounter ? ?Completed and sent [employer] Advancing Access application for [place] for this patient who is uninsured.   ? ?BIN      [id number] ?PCN    [id number] ?GRP    [id number] ?ID        [id number] ? ? ? ?Lyndel Safe, CPhT ?Pharmacy Patient Advocate Specialist ?[place] Patient Advocate Team ?Direct Number: [phone]  Fax: [phone]  ?

## 2021-10-29 NOTE — Discharge Summary (Signed)
? ?Name: Justin [name] ?MRN: [id number] ?DOB: 03-11-1981 40 y.o. ?PCP: Pcp, No ? ?Date of Admission: 10/28/2021  8:45 AM ?Date of Discharge: 10/29/2021 ?Attending Physician: Dr. Oswaldo Done ? ?Discharge Diagnosis: ?Principal Problem: ?  Seizure disorder (HCC) ?Active Problems: ?  Human immunodeficiency virus (HIV) disease (HCC) ?  ? ?Discharge Medications: ?Allergies as of 10/29/2021   ?No Known Allergies ?  ? ?  ?Medication List  ?  ? ?STOP taking these medications   ? ?chlorhexidine 0.12 % solution ?Commonly known as: PERIDEX ?  ?dicyclomine 20 MG tablet ?Commonly known as: BENTYL ?  ? ?  ? ?TAKE these medications   ? ?Biktarvy 50-200-25 MG Tabs tablet ?Generic drug: bictegravir-emtricitabine-tenofovir AF ?Take 1 tablet by mouth daily. ?  ?citalopram 20 MG tablet ?Commonly known as: CELEXA ?Take 1 tablet (20 mg total) by mouth daily. ?  ?ibuprofen 800 MG tablet ?Commonly known as: ADVIL ?Take 1 tablet (800 mg total) by mouth every 8 (eight) hours as needed for fever, headache, mild pain, moderate pain or cramping. ?  ?levETIRAcetam 500 MG tablet ?Commonly known as: KEPPRA ?Take 2.5 tablets (1,250 mg total) by mouth 2 (two) times daily. ?What changed:  ?medication strength ?how much to take ?Another medication with the same name was removed. Continue taking this medication, and follow the directions you see here. ?  ?loperamide 2 MG capsule ?Commonly known as: IMODIUM ?Take 1 capsule (2 mg total) by mouth 4 (four) times daily as needed for diarrhea or loose stools. ?  ? ?  ? ? ?Disposition and follow-up:   ?Mr.Justin [name] was discharged from [facility] in stable condition.  At the hospital follow up visit please address: ? ?1.  Follow-up: ? a.  Seizure patient to follow-up in [facility] clinic.  Does not have insurance but given orange card paperwork.  Will need to follow-up with neurology.  He takes 1250mg  Keppra twice daily.  Neurology has recommended long-acting Keppra if insurance will  cover. ?  ? b.  AKI possibly related to seizure.  Improved with fluids.  Will need BMP on follow-up. ? ? ? ?2.  Labs / imaging needed at time of follow-up: BMP ? ?3.  Pending labs/ test needing follow-up: None ? ?4.  Medication Changes ? Started: ? Stopped: ? Changed: ? Abx -   End Date: ? ?Follow-up Appointments: ? Follow-up Information   ? ? [facility]. Go on 11/09/2021.   ?Why: You have an appointment scheduled with the Internal Medicine Clinic at 1:15PM. Please arrive 15 minutes prior to this time. ?Contact information: ?[address] ?[place] ?[phone] ? ?  ?  ? ?  ?  ? ?  ? ? ?Hospital Course by problem list:  ?Seizure ?Patient presented with tonic-clonic seizure.  Underwent head CT which ruled out intracranial pathology.  He was evaluated by neurology and received a Keppra load and Ativan.  Patient had reported missing some doses of his Keppra and it was felt that medication nonadherence is likely contributing.  EEG was performed and showed no epileptiform discharges.  Neurology felt there was low suspicion for any ongoing activity that required further work-up.  Prior to discharge he was counseled on importance of close PCP follow-up, importance of establishing with neurologist, he was given orange card paperwork since he does not have insurance currently and medications were brought to bedside by TOC. ? ?Patient noted to have AKI on initial exam.  He was given IV  fluids with improvement in his renal function.  Felt to be related to recent seizure.  Recommended this be followed up outpatient. ? ?Discharge Subjective: ?Patient seen and evaluated bedside.  He is awake, alert and oriented.  Denies any further symptoms in his right at home. ?Updated his mother as well and informed her importance as well of close follow-up and PCP clinic and establishing with neurologist. ? ?Discharge Exam:   ?BP 116/65   Pulse 90   Temp 97.8 ?F (36.6 ?C) (Oral)   Resp  19   Ht 5\' 11"  (1.803 m)   Wt 90.7 kg   SpO2 98%   BMI 27.89 kg/m?  ?Constitutional: well-appearing male sitting in bed, in no acute distress ?HENT: normocephalic atraumatic, mucous membranes moist ?Eyes: conjunctiva non-erythematous ?Neck: supple ?Cardiovascular: regular rate and rhythm, no m/r/g ?Pulmonary/Chest: normal work of breathing on room air, lungs clear to auscultation bilaterally ?Abdominal: soft, non-tender, non-distended ?MSK: normal bulk and tone ?Neurological: alert & oriented x 3, 5/5 strength in bilateral upper and lower extremities ?Skin: warm and dry ?Psych: Mood and affect appropriate ? ?Pertinent Labs, Studies, and Procedures:  ? ?  Latest Ref Rng & Units 10/29/2021  ?  1:31 AM 10/28/2021  ?  9:04 AM 10/05/2021  ?  5:29 PM  ?CBC  ?WBC 4.0 - 10.5 K/uL 6.4   8.4   6.0    ?Hemoglobin 13.0 - 17.0 g/dL 16.115.0   09.616.9   04.515.6    ?Hematocrit 39.0 - 52.0 % 45.0   50.5   47.8    ?Platelets 150 - 400 K/uL 173   187   195    ? ? ? ?  Latest Ref Rng & Units 10/29/2021  ?  1:31 AM 10/28/2021  ?  9:04 AM 10/05/2021  ?  8:51 PM  ?CMP  ?Glucose 70 - 99 mg/dL 75   [id number]     ?BUN 6 - 20 mg/dL 12   17     ?Creatinine 0.61 - 1.24 mg/dL 8.111.26   9.141.53     ?Sodium 135 - 145 mmol/L 135   137     ?Potassium 3.5 - 5.1 mmol/L 3.8   4.4     ?Chloride 98 - 111 mmol/L 105   105     ?CO2 22 - 32 mmol/L 23   22     ?Calcium 8.9 - 10.3 mg/dL 8.8   9.2     ?Total Protein 6.5 - 8.1 g/dL 6.6   7.5   6.1    ?Total Bilirubin 0.3 - 1.2 mg/dL 2.3   2.1   1.5    ?Alkaline Phos 38 - 126 U/L 68   81   63    ?AST 15 - 41 U/L 33   46   26    ?ALT 0 - 44 U/L 30   36   26    ? ? ?CT HEAD WO CONTRAST (5MM) ? ?Result Date: 10/28/2021 ?CLINICAL DATA:  Headache, new or worsening, neuro deficit (Age 41-49y) EXAM: CT HEAD WITHOUT CONTRAST TECHNIQUE: Contiguous axial images were obtained from the base of the skull through the vertex without intravenous contrast. RADIATION DOSE REDUCTION: This exam was performed according to the departmental dose-optimization  program which includes automated exposure control, adjustment of the mA and/or kV according to patient size and/or use of iterative reconstruction technique. COMPARISON:  Head CT [id number].  Head CT and brain MRI 07/31/2021 FINDINGS: Brain: No evidence of acute infarction, hemorrhage, hydrocephalus, extra-axial collection or  mass lesion/mass effect. Vascular: No hyperdense vessel or unexpected calcification. Skull: Normal. Negative for fracture or focal lesion. Sinuses/Orbits: No acute finding. Other: Mild scalp scarring in the left posterior vertex. IMPRESSION: No acute intracranial abnormality. Electronically Signed   By: Narda Rutherford M.D.   On: 10/28/2021 16:08  ?  ? ?Discharge Instructions: ?Discharge Instructions   ? ? Call MD for:   Complete by: As directed ?  ? Call MD for:  difficulty breathing, headache or visual disturbances   Complete by: As directed ?  ? Call MD for:  extreme fatigue   Complete by: As directed ?  ? Call MD for:  hives   Complete by: As directed ?  ? Call MD for:  persistant dizziness or light-headedness   Complete by: As directed ?  ? Call MD for:  persistant nausea and vomiting   Complete by: As directed ?  ? Call MD for:  redness, tenderness, or signs of infection (pain, swelling, redness, odor or green/yellow discharge around incision site)   Complete by: As directed ?  ? Call MD for:  severe uncontrolled pain   Complete by: As directed ?  ? Call MD for:  temperature >100.4   Complete by: As directed ?  ? Diet - low sodium heart healthy   Complete by: As directed ?  ? Discharge instructions   Complete by: As directed ?  ? Mr. Stepanian,  ?I am so glad you are feeling better and can go home today! You were admitted to the hospital because you had a seizure. Thankfully, you are doing well and can be discharged. Please see the following notes: ? ?-You will continue to take Keppra 1250mg  twice daily. No medication changes. ? ?- Please follow-up with our Internal Medicine Clinic. This is  located on the ground floor. You have an appointment with the clinic at 1:15PM on Tuesday, May 9th. Please make sure to arrive 15 minutes prior to this.  ? ?- We have given you information regarding the orange card. This

## 2021-10-29 NOTE — TOC Transition Note (Signed)
Transition of Care (TOC) - CM/SW Discharge Note ? ? ?Patient Details  ?Name: Justin [name] ?MRN: [id number] ?Date of Birth: 1980/12/09 ? ?Transition of Care (TOC) CM/SW Contact:  ?Kermit Balo, RN ?Phone Number: ?10/29/2021, 1:45 PM ? ? ?Clinical Narrative:    ?Patient is discharging home with self care. PCP appt on the AVS.  ?Medications for home delivered to the room per [facility] pharmacy.  ?CM has provided a cab voucher for transport home. ? ? ?Final next level of care: Home/Self Care ?Barriers to Discharge: Inadequate or no insurance, Barriers Unresolved (comment) ? ? ?Patient Goals and CMS Choice ?  ?  ?  ? ?Discharge Placement ?  ?           ?  ?  ?  ?  ? ?Discharge Plan and Services ?  ?Discharge Planning Services: CM Consult ?           ?  ?  ?  ?  ?  ?  ?  ?  ?  ?  ? ?Social Determinants of Health (SDOH) Interventions ?  ? ? ?Readmission Risk Interventions ?   ? View : No data to display.  ?  ?  ?  ? ? ? ? ? ?

## 2021-10-29 NOTE — TOC Initial Note (Signed)
Transition of Care (TOC) - Initial/Assessment Note  ? ? ?Patient Details  ?Name: JAQUAWN SAFFRAN ?MRN: [id number] ?Date of Birth: Nov 08, 1980 ? ?Transition of Care (TOC) CM/SW Contact:    ?Kermit Balo, RN ?Phone Number: ?10/29/2021, 11:43 AM ? ?Clinical Narrative:                 ?Patient is from home with his mom. He was able to answer questions but states he is still a little foggy and slow with responses.  ?Patient uses public transportation to get to work. He manages his own medications at home. Pt denies issues with home medications but its thought he may have missed a dose of his seizure med.  ?[facility] is going to see him in their clinic after d/c. Information will be on the AVS.  ?Patient will need transport home at d/c as his mother doesn't drive.  ?TOC following. ? ?Expected Discharge Plan: Home/Self Care ?Barriers to Discharge: Inadequate or no insurance, Continued Medical Work up ? ? ?Patient Goals and CMS Choice ?  ?  ?  ? ?Expected Discharge Plan and Services ?Expected Discharge Plan: Home/Self Care ?  ?Discharge Planning Services: CM Consult ?  ?Living arrangements for the past 2 months: Single Family Home ?                ?  ?  ?  ?  ?  ?  ?  ?  ?  ?  ? ?Prior Living Arrangements/Services ?Living arrangements for the past 2 months: Single Family Home ?Lives with:: Parents (Mom) ?Patient language and need for interpreter reviewed:: Yes ?Do you feel safe going back to the place where you live?: Yes      ?  ?  ?  ?Criminal Activity/Legal Involvement Pertinent to Current Situation/Hospitalization: No - Comment as needed ? ?Activities of Daily Living ?Home Assistive Devices/Equipment: None ?ADL Screening (condition at time of admission) ?Patient's cognitive ability adequate to safely complete daily activities?: Yes ?Is the patient deaf or have difficulty hearing?: No ?Does the patient have difficulty seeing, even when wearing glasses/contacts?: No ?Does the patient have difficulty  concentrating, remembering, or making decisions?: No ?Patient able to express need for assistance with ADLs?: Yes ?Does the patient have difficulty dressing or bathing?: No ?Independently performs ADLs?: Yes (appropriate for developmental age) ?Does the patient have difficulty walking or climbing stairs?: No ?Weakness of Legs: Both ?Weakness of Arms/Hands: None ? ?Permission Sought/Granted ?  ?  ?   ?   ?   ?   ? ?Emotional Assessment ?Appearance:: Appears stated age ?Attitude/Demeanor/Rapport: Engaged ?Affect (typically observed): Accepting ?Orientation: : Oriented to Self, Oriented to Place, Oriented to  Time, Oriented to Situation ?  ?Psych Involvement: No (comment) ? ?Admission diagnosis:  Status epilepticus (HCC) [G40.901] ?Patient Active Problem List  ? Diagnosis Date Noted  ? Seizure disorder (HCC) 10/28/2021  ? Seizures (HCC) 07/31/2021  ? 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HCC)   ? 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 Substance use disorder 08/07/2019  ? Chronic hepatitis C without hepatic coma (HCC) 08/17/2017  ? Screening examination for venereal disease 05/03/2017  ? Encounter for long-term (current) use of high-risk medication 05/03/2017  ? Anxiety 09/26/2016  ? Human immunodeficiency virus (HIV) disease (HCC) 09/02/2014  ? ?PCP:  Pcp, No ?Pharmacy:   ?[id number] - [place], [place] - [address] AT [facility] [address] ?[address] ?[place] [id number] ?Phone: [phone] Fax: [phone] ? ? ? ? ?Social Determinants of Health (SDOH) Interventions ?  ? ?Readmission Risk Interventions ?   ? View : No data to display.  ?  ?  ?  ? ? ? ?

## 2021-10-29 NOTE — Plan of Care (Signed)
?Problem: Education: ?Goal: Expressions of having a comfortable level of knowledge regarding the disease process will increase ?10/29/2021 1453 by Jill Side, RN ?Outcome: Adequate for Discharge ?10/29/2021 1453 by Jill Side, RN ?Outcome: Adequate for Discharge ?  ?Problem: Coping: ?Goal: Ability to adjust to condition or change in health will improve ?10/29/2021 1453 by Jill Side, RN ?Outcome: Adequate for Discharge ?10/29/2021 1453 by Jill Side, RN ?Outcome: Adequate for Discharge ?Goal: Ability to identify appropriate support needs will improve ?10/29/2021 1453 by Jill Side, RN ?Outcome: Adequate for Discharge ?10/29/2021 1453 by Jill Side, RN ?Outcome: Adequate for Discharge ?  ?Problem: Health Behavior/Discharge Planning: ?Goal: Compliance with prescribed medication regimen will improve ?10/29/2021 1453 by Jill Side, RN ?Outcome: Adequate for Discharge ?10/29/2021 1453 by Jill Side, RN ?Outcome: Adequate for Discharge ?  ?Problem: Medication: ?Goal: Risk for medication side effects will decrease ?10/29/2021 1453 by Jill Side, RN ?Outcome: Adequate for Discharge ?10/29/2021 1453 by Jill Side, RN ?Outcome: Adequate for Discharge ?  ?Problem: Clinical Measurements: ?Goal: Complications related to the disease process, condition or treatment will be avoided or minimized ?10/29/2021 1453 by Jill Side, RN ?Outcome: Adequate for Discharge ?10/29/2021 1453 by Jill Side, RN ?Outcome: Adequate for Discharge ?Goal: Diagnostic test results will improve ?10/29/2021 1453 by Jill Side, RN ?Outcome: Adequate for Discharge ?10/29/2021 1453 by Jill Side, RN ?Outcome: Adequate for Discharge ?  ?Problem: Safety: ?Goal: Verbalization of understanding the information provided will improve ?10/29/2021 1453 by Jill Side, RN ?Outcome: Adequate for Discharge ?10/29/2021 1453 by Jill Side, RN ?Outcome: Adequate for Discharge ?  ?Problem: Self-Concept: ?Goal:  Level of anxiety will decrease ?10/29/2021 1453 by Jill Side, RN ?Outcome: Adequate for Discharge ?10/29/2021 1453 by Jill Side, RN ?Outcome: Adequate for Discharge ?Goal: Ability to verbalize feelings about condition will improve ?10/29/2021 1453 by Jill Side, RN ?Outcome: Adequate for Discharge ?10/29/2021 1453 by Jill Side, RN ?Outcome: Adequate for Discharge ?  ?Problem: Education: ?Goal: Knowledge of General Education information will improve ?Description: Including pain rating scale, medication(s)/side effects and non-pharmacologic comfort measures ?10/29/2021 1453 by Jill Side, RN ?Outcome: Adequate for Discharge ?10/29/2021 1453 by Jill Side, RN ?Outcome: Adequate for Discharge ?  ?Problem: Health Behavior/Discharge Planning: ?Goal: Ability to manage health-related needs will improve ?10/29/2021 1453 by Jill Side, RN ?Outcome: Adequate for Discharge ?10/29/2021 1453 by Jill Side, RN ?Outcome: Adequate for Discharge ?  ?Problem: Clinical Measurements: ?Goal: Ability to maintain clinical measurements within normal limits will improve ?10/29/2021 1453 by Jill Side, RN ?Outcome: Adequate for Discharge ?10/29/2021 1453 by Jill Side, RN ?Outcome: Adequate for Discharge ?Goal: Will remain free from infection ?10/29/2021 1453 by Jill Side, RN ?Outcome: Adequate for Discharge ?10/29/2021 1453 by Jill Side, RN ?Outcome: Adequate for Discharge ?Goal: Diagnostic test results will improve ?10/29/2021 1453 by Jill Side, RN ?Outcome: Adequate for Discharge ?10/29/2021 1453 by Jill Side, RN ?Outcome: Adequate for Discharge ?Goal: Respiratory complications will improve ?10/29/2021 1453 by Jill Side, RN ?Outcome: Adequate for Discharge ?10/29/2021 1453 by Jill Side, RN ?Outcome: Adequate for Discharge ?Goal: Cardiovascular complication will be avoided ?10/29/2021 1453 by Jill Side, RN ?Outcome: Adequate for Discharge ?10/29/2021 1453 by Jill Side, RN ?Outcome: Adequate for Discharge ?  ?Problem: Activity: ?Goal: Risk for activity intolerance will decrease ?10/29/2021 1453 by Jill Side, RN ?Outcome: Adequate for Discharge ?  10/29/2021 1453 by Jill Side, RN ?Outcome: Adequate for Discharge ?  ?Problem: Nutrition: ?Goal: Adequate nutrition will be maintained ?10/29/2021 1453 by Jill Side, RN ?Outcome: Adequate for Discharge ?10/29/2021 1453 by Jill Side, RN ?Outcome: Adequate for Discharge ?  ?Problem: Coping: ?Goal: Level of anxiety will decrease ?10/29/2021 1453 by Jill Side, RN ?Outcome: Adequate for Discharge ?10/29/2021 1453 by Jill Side, RN ?Outcome: Adequate for Discharge ?  ?Problem: Elimination: ?Goal: Will not experience complications related to bowel motility ?10/29/2021 1453 by Jill Side, RN ?Outcome: Adequate for Discharge ?10/29/2021 1453 by Jill Side, RN ?Outcome: Adequate for Discharge ?Goal: Will not experience complications related to urinary retention ?10/29/2021 1453 by Jill Side, RN ?Outcome: Adequate for Discharge ?10/29/2021 1453 by Jill Side, RN ?Outcome: Adequate for Discharge ?  ?Problem: Pain Managment: ?Goal: General experience of comfort will improve ?10/29/2021 1453 by Jill Side, RN ?Outcome: Adequate for Discharge ?10/29/2021 1453 by Jill Side, RN ?Outcome: Adequate for Discharge ?  ?Problem: Safety: ?Goal: Ability to remain free from injury will improve ?10/29/2021 1453 by Jill Side, RN ?Outcome: Adequate for Discharge ?10/29/2021 1453 by Jill Side, RN ?Outcome: Adequate for Discharge ?  ?Problem: Skin Integrity: ?Goal: Risk for impaired skin integrity will decrease ?10/29/2021 1453 by Jill Side, RN ?Outcome: Adequate for Discharge ?10/29/2021 1453 by Jill Side, RN ?Outcome: Adequate for Discharge ?  ?

## 2021-10-29 NOTE — Progress Notes (Signed)
Patient has been given discharge instructions with AVS including how to take medications and when.  What follow up appointments he has.  All questions were answered.  Medications were delivered to patient room from pharmacy.  Patient was dressed and belongings gathered.  Taken out via wheelchair per Probation officer to [employer].  No s/s of distress denies any pain. ?

## 2021-11-01 ENCOUNTER — Other Ambulatory Visit (HOSPITAL_COMMUNITY): Payer: Self-pay

## 2021-11-03 ENCOUNTER — Other Ambulatory Visit (HOSPITAL_COMMUNITY): Payer: Self-pay

## 2021-11-04 ENCOUNTER — Other Ambulatory Visit (HOSPITAL_COMMUNITY): Payer: Self-pay

## 2021-11-09 ENCOUNTER — Ambulatory Visit: Payer: Self-pay | Admitting: Internal Medicine

## 2021-11-22 ENCOUNTER — Encounter (HOSPITAL_COMMUNITY): Payer: Self-pay

## 2021-11-22 ENCOUNTER — Emergency Department (HOSPITAL_COMMUNITY): Payer: Self-pay

## 2021-11-22 ENCOUNTER — Emergency Department (HOSPITAL_COMMUNITY)
Admission: EM | Admit: 2021-11-22 | Discharge: 2021-11-22 | Disposition: A | Discharge status: Home / Self Care | Payer: Self-pay | Attending: Emergency Medicine | Admitting: Emergency Medicine

## 2021-11-22 ENCOUNTER — Other Ambulatory Visit: Payer: Self-pay

## 2021-11-22 DIAGNOSIS — M545 Low back pain, unspecified: Secondary | ICD-10-CM | POA: Insufficient documentation

## 2021-11-22 DIAGNOSIS — S00502A Unspecified superficial injury of oral cavity, initial encounter: Secondary | ICD-10-CM | POA: Insufficient documentation

## 2021-11-22 DIAGNOSIS — R569 Unspecified convulsions: Secondary | ICD-10-CM | POA: Insufficient documentation

## 2021-11-22 DIAGNOSIS — R519 Headache, unspecified: Secondary | ICD-10-CM | POA: Insufficient documentation

## 2021-11-22 DIAGNOSIS — Z21 Asymptomatic human immunodeficiency virus [HIV] infection status: Secondary | ICD-10-CM | POA: Insufficient documentation

## 2021-11-22 DIAGNOSIS — S0993XA Unspecified injury of face, initial encounter: Secondary | ICD-10-CM

## 2021-11-22 DIAGNOSIS — M542 Cervicalgia: Secondary | ICD-10-CM | POA: Insufficient documentation

## 2021-11-22 DIAGNOSIS — Y990 Civilian activity done for income or pay: Secondary | ICD-10-CM | POA: Insufficient documentation

## 2021-11-22 DIAGNOSIS — W010XXA Fall on same level from slipping, tripping and stumbling without subsequent striking against object, initial encounter: Secondary | ICD-10-CM | POA: Insufficient documentation

## 2021-11-22 LAB — COMPREHENSIVE METABOLIC PANEL
ALT: 32 U/L (ref 0–44)
AST: 39 U/L (ref 15–41)
Albumin: 4 g/dL (ref 3.5–5.0)
Alkaline Phosphatase: 68 U/L (ref 38–126)
Anion gap: 7 (ref 5–15)
BUN: 14 mg/dL (ref 6–20)
CO2: 25 mmol/L (ref 22–32)
Calcium: 9.1 mg/dL (ref 8.9–10.3)
Chloride: 105 mmol/L (ref 98–111)
Creatinine, Ser: 1.23 mg/dL (ref 0.61–1.24)
GFR, Estimated: >60 mL/min (ref >60)
Glucose, Bld: 115 mg/dL — ABNORMAL HIGH (ref 70–99)
Potassium: 3.7 mmol/L (ref 3.5–5.1)
Sodium: 137 mmol/L (ref 135–145)
Total Bilirubin: 2.2 mg/dL — ABNORMAL HIGH (ref 0.3–1.2)
Total Protein: 7 g/dL (ref 6.5–8.1)

## 2021-11-22 LAB — CBC WITH DIFFERENTIAL/PLATELET
Abs Immature Granulocytes: 0.01 10*3/uL (ref 0.00–0.07)
Basophils Absolute: 0 10*3/uL (ref 0.0–0.1)
Basophils Relative: 0 %
Eosinophils Absolute: 0.1 10*3/uL (ref 0.0–0.5)
Eosinophils Relative: 1 %
HCT: 49 % (ref 39.0–52.0)
Hemoglobin: 16.5 g/dL (ref 13.0–17.0)
Immature Granulocytes: 0 %
Lymphocytes Relative: 40 %
Lymphs Abs: 2.3 10*3/uL (ref 0.7–4.0)
MCH: 33.5 pg (ref 26.0–34.0)
MCHC: 33.7 g/dL (ref 30.0–36.0)
MCV: 99.4 fL (ref 80.0–100.0)
Monocytes Absolute: 0.4 10*3/uL (ref 0.1–1.0)
Monocytes Relative: 8 %
Neutro Abs: 2.9 10*3/uL (ref 1.7–7.7)
Neutrophils Relative %: 51 %
Platelets: 173 10*3/uL (ref 150–400)
RBC: 4.93 MIL/uL (ref 4.22–5.81)
RDW: 12.2 % (ref 11.5–15.5)
WBC: 5.6 10*3/uL (ref 4.0–10.5)
nRBC: 0 % (ref 0.0–0.2)

## 2021-11-22 LAB — ETHANOL: Alcohol, Ethyl (B): <10 mg/dL (ref <10)

## 2021-11-22 LAB — CBG MONITORING, ED: Glucose-Capillary: 84 mg/dL (ref 70–99)

## 2021-11-22 MED ORDER — LEVETIRACETAM IN NACL 1000 MG/100ML IV SOLN
1000.0000 mg | Freq: Once | INTRAVENOUS | Status: AC
  Administered 2021-11-22: 1000 mg via INTRAVENOUS
  Filled 2021-11-22: qty 100

## 2021-11-22 MED ORDER — LEVETIRACETAM 250 MG PO TABS: 1250.0000 mg | ORAL_TABLET | Freq: Two times a day (BID) | ORAL | 0 refills | Status: DC

## 2021-11-22 NOTE — ED Notes (Signed)
Patient given 1 cup of orange juice.

## 2021-11-22 NOTE — ED Provider Notes (Signed)
[facility]-EMERGENCY DEPT Provider Note   CSN: [id number] Arrival date & time: 11/22/21  1300     History  No chief complaint on file.   Justin [name] is a 41 y.o. male.  Patient is a 41 year old who presents after a seizure.  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  He is on Keppra.  He also has a history of HIV on chart review.  He reports that he was at work and had a seizure.  He says he did fall to the ground and struck his mouth.  He knocked this front teeth out.  He is complaining of some neck and low back pain as well as an intense headache after the fall.  He went home and had a second seizure.  He is currently living in a hotel.  He denies any injuries from the second seizure.  On chart review, he does have a history of noncompliance with his Keppra.  He states he missed a dose yesterday but otherwise has been taking it.  He denies any fevers or other recent illnesses.  He did drink some alcohol 2 days ago but denies any other alcohol use.      Home Medications Prior to Admission medications   Medication Sig Start Date End Date Taking? Authorizing Provider  bictegravir-emtricitabine-tenofovir AF (BIKTARVY) 50-200-25 MG TABS tablet Take 1 tablet by mouth daily. 10/29/21  Yes Evlyn Kanner, MD  levETIRAcetam (KEPPRA) 500 MG tablet Take 2.5 tablets (1,250 mg total) by mouth 2 (two) times daily. 10/29/21 11/28/21 Yes Evlyn Kanner, MD  citalopram (CELEXA) 20 MG tablet Take 1 tablet (20 mg total) by mouth daily. Patient not taking: Reported on 09/21/2021 08/16/21   [name], MD  ibuprofen (ADVIL) 800 MG tablet Take 1 tablet (800 mg total) by mouth every 8 (eight) hours as needed for fever, headache, mild pain, moderate pain or cramping. Patient not taking: Reported on 09/21/2021 08/01/21   Elgergawy, Leana Roe, MD  loperamide (IMODIUM) 2 MG capsule Take 1 capsule (2 mg total) by mouth 4 (four) times daily as needed for diarrhea or loose stools. Patient  not taking: Reported on 09/21/2021 07/26/21   [name], DO      Allergies    Patient has no known allergies.    Review of Systems   Review of Systems  Constitutional:  Negative for chills, diaphoresis, fatigue and fever.  HENT:  Positive for dental problem. Negative for congestion, rhinorrhea and sneezing.   Eyes: Negative.   Respiratory:  Negative for cough, chest tightness and shortness of breath.   Cardiovascular:  Negative for chest pain and leg swelling.  Gastrointestinal:  Negative for abdominal pain, blood in stool, diarrhea, nausea and vomiting.  Genitourinary:  Negative for difficulty urinating, flank pain, frequency and hematuria.  Musculoskeletal:  Positive for back pain and neck pain. Negative for arthralgias.  Skin:  Negative for rash.  Neurological:  Positive for seizures and headaches. Negative for dizziness, speech difficulty, weakness and numbness.   Physical Exam Updated Vital Signs BP (!) 147/123   Pulse 70   Temp 98.8 F (37.1 C) (Oral)   Resp 14   Ht 5' 11.5" (1.816 m)   Wt 90.7 kg   SpO2 96%   BMI 27.51 kg/m  Physical Exam Constitutional:      Appearance: He is well-developed.  HENT:     Head: Normocephalic and atraumatic.     Mouth/Throat:     Comments: Patient has decayed teeth.  The  front right middle and lateral incisor are broken off.  It appears that just broke at the decay.  There is still part of the tooth in place.  No bleeding. Eyes:     Pupils: Pupils are equal, round, and reactive to light.  Neck:     Comments: Positive tenderness throughout the cervical spine.  There is no pain to the thoracic spine.  There is pain throughout the lumbosacral spine.  No step-offs or deformities. Cardiovascular:     Rate and Rhythm: Normal rate and regular rhythm.     Heart sounds: Normal heart sounds.  Pulmonary:     Effort: Pulmonary effort is normal. No respiratory distress.     Breath sounds: Normal breath sounds. No wheezing or rales.  Chest:      Chest wall: No tenderness.  Abdominal:     General: Bowel sounds are normal.     Palpations: Abdomen is soft.     Tenderness: There is no abdominal tenderness. There is no guarding or rebound.  Musculoskeletal:        General: Normal range of motion.     Comments: Positive tenderness on range of motion and palpation of his right hip.  There is no pain to the knee or ankle.  Pedal pulses are intact.  Normal sensation and motor function distally.  Lymphadenopathy:     Cervical: No cervical adenopathy.  Skin:    General: Skin is warm and dry.     Findings: No rash.  Neurological:     Mental Status: He is alert and oriented to person, place, and time.    ED Results / Procedures / Treatments   Labs (all labs ordered are listed, but only abnormal results are displayed) Labs Reviewed  COMPREHENSIVE METABOLIC PANEL - Abnormal; Notable for the following components:      Result Value   Glucose, Bld 115 (*)    Total Bilirubin 2.2 (*)    All other components within normal limits  ETHANOL  CBC WITH DIFFERENTIAL/PLATELET  CBG MONITORING, ED    EKG EKG Interpretation  Date/Time:  Monday Nov 22 2021 13:09:33 EDT Ventricular Rate:  67 PR Interval:  130 QRS Duration: 97 QT Interval:  375 QTC Calculation: 387 R Axis:   -1 Text Interpretation: Sinus rhythm ST elev, probable normal early repol pattern No significant change since prior 4/23 Confirmed by Meridee ScoreButler, Michael [phone]([id number]) on 11/22/2021 1:40:52 PM  Radiology DG Lumbar Spine Complete  Result Date: 11/22/2021 CLINICAL DATA:  Hip and back pain after seizures. EXAM: LUMBAR SPINE - COMPLETE 4+ VIEW COMPARISON:  None Available. FINDINGS: Five lumbar type vertebral bodies. Mild loss of the usual lumbar lordosis without anterior subluxation. This may be positional or could indicate muscle spasm or ligamentous injury. No vertebral compression deformities. No focal bone lesion or bone destruction. Bone cortex appears intact. Intervertebral disc  space heights are normal. Visualized sacrum appears intact. IMPRESSION: Loss of usual lumbar lordosis is nonspecific. No acute displaced fractures identified. Electronically Signed   By: Burman NievesWilliam  Stevens M.D.   On: 11/22/2021 15:15   CT Head Wo Contrast  Result Date: 11/22/2021 CLINICAL DATA:  41 year old male with seizure. Head and neck injury from fall. EXAM: CT HEAD WITHOUT CONTRAST CT CERVICAL SPINE WITHOUT CONTRAST TECHNIQUE: Multidetector CT imaging of the head and cervical spine was performed following the standard protocol without intravenous contrast. Multiplanar CT image reconstructions of the cervical spine were also generated. RADIATION DOSE REDUCTION: This exam was performed according to the departmental dose-optimization program  which includes automated exposure control, adjustment of the mA and/or kV according to patient size and/or use of iterative reconstruction technique. COMPARISON:  10/28/2021 and prior CTs FINDINGS: CT HEAD FINDINGS Brain: No evidence of acute infarction, hemorrhage, hydrocephalus, extra-axial collection or mass lesion/mass effect. Vascular: No hyperdense vessel or unexpected calcification. Skull: Normal. Negative for fracture or focal lesion. Sinuses/Orbits: No acute finding. Other: Mild high posterior scalp soft tissue swelling noted. CT CERVICAL SPINE FINDINGS Alignment: Reversal of the normal cervical lordosis again noted. There is no evidence of subluxation. Skull base and vertebrae: No acute fracture. No primary bone lesion or focal pathologic process. Soft tissues and spinal canal: No prevertebral fluid or swelling. No visible canal hematoma. Disc levels: Moderate degenerative disc disease and mild spondylosis at C5-C6 again noted. Upper chest: No acute abnormality Other: None IMPRESSION: 1. No evidence of intracranial abnormality. Mild high posterior scalp soft tissue swelling without fracture. 2. No static evidence of acute injury to the cervical spine. Reversal of  the normal cervical lordosis again noted. Electronically Signed   By: Harmon Pier M.D.   On: 11/22/2021 15:32   CT Cervical Spine Wo Contrast  Result Date: 11/22/2021 CLINICAL DATA:  41 year old male with seizure. Head and neck injury from fall. EXAM: CT HEAD WITHOUT CONTRAST CT CERVICAL SPINE WITHOUT CONTRAST TECHNIQUE: Multidetector CT imaging of the head and cervical spine was performed following the standard protocol without intravenous contrast. Multiplanar CT image reconstructions of the cervical spine were also generated. RADIATION DOSE REDUCTION: This exam was performed according to the departmental dose-optimization program which includes automated exposure control, adjustment of the mA and/or kV according to patient size and/or use of iterative reconstruction technique. COMPARISON:  10/28/2021 and prior CTs FINDINGS: CT HEAD FINDINGS Brain: No evidence of acute infarction, hemorrhage, hydrocephalus, extra-axial collection or mass lesion/mass effect. Vascular: No hyperdense vessel or unexpected calcification. Skull: Normal. Negative for fracture or focal lesion. Sinuses/Orbits: No acute finding. Other: Mild high posterior scalp soft tissue swelling noted. CT CERVICAL SPINE FINDINGS Alignment: Reversal of the normal cervical lordosis again noted. There is no evidence of subluxation. Skull base and vertebrae: No acute fracture. No primary bone lesion or focal pathologic process. Soft tissues and spinal canal: No prevertebral fluid or swelling. No visible canal hematoma. Disc levels: Moderate degenerative disc disease and mild spondylosis at C5-C6 again noted. Upper chest: No acute abnormality Other: None IMPRESSION: 1. No evidence of intracranial abnormality. Mild high posterior scalp soft tissue swelling without fracture. 2. No static evidence of acute injury to the cervical spine. Reversal of the normal cervical lordosis again noted. Electronically Signed   By: Harmon Pier M.D.   On: 11/22/2021 15:32    DG Hip Unilat W or Wo Pelvis 2-3 Views Right  Result Date: 11/22/2021 CLINICAL DATA:  Hip and back pain after a seizure. EXAM: DG HIP (WITH OR WITHOUT PELVIS) 2-3V RIGHT COMPARISON:  None Available. FINDINGS: There is no evidence of hip fracture or dislocation. There is no evidence of arthropathy or other focal bone abnormality. IMPRESSION: Negative. Electronically Signed   By: Burman Nieves M.D.   On: 11/22/2021 15:15    Procedures Procedures    Medications Ordered in ED Medications  levETIRAcetam (KEPPRA) IVPB 1000 mg/100 mL premix (0 mg Intravenous Stopped 11/22/21 1453)    ED Course/ Medical Decision Making/ A&P                           Medical Decision Making  Amount and/or Complexity of Data Reviewed Labs: ordered. Radiology: ordered.  Risk Prescription drug management.   Patient is a 41 year old male with a history of seizures who presents after 2 seizures today.  He had headache associated with neck and back pain and some right hip pain from the fall.  He also has some broken teeth.  He had a CT scan of his head and cervical spine which showed no acute abnormalities.  X-rays of his right hip and lumbar spine were interpreted by me and show no acute fractures or other injuries.  This was confirmed by the radiologist.  His labs are nonconcerning.  His electrolytes are grossly unremarkable.  He was loaded with some IV Keppra.  He does have a history of noncompliance.  He has not had any further seizure activity after observation in the emergency room.  He was discharged home in good condition.  He was advised that he needs to follow-up with the internal medicine clinic.  He was given an appointment there following his last discharge but it does not appear that he made that appointment.  He was strongly advised to call and make a follow-up appointment and they were going to help get him referred to neurology.  He was given a refill on his Keppra.  He was instructed that he will  need to follow-up with a dentist as well.  Return precautions were given.  Final Clinical Impression(s) / ED Diagnoses Final diagnoses:  Seizure [facility])    Rx / DC Orders ED Discharge Orders     None         Rolan Bucco, MD 11/22/21 1614

## 2021-11-22 NOTE — ED Triage Notes (Signed)
Pt bib pov from hotel. Patient states he had 2 witnessed seizures today. Patient has hx of seizures, currently complaint with Keppra. Patient had a current relapse of alcohol on Friday. Patient states his last seizure being about a month ago.

## 2021-11-22 NOTE — Discharge Instructions (Addendum)
Make sure that you call make an appointment to follow-up with the internal medicine clinic.  He needs some help getting follow-up with neurology to better manage her seizures.  Return to the emergency room if you have any worsening symptoms.  You also need to follow-up with a dentist about your teeth.

## 2021-11-30 ENCOUNTER — Other Ambulatory Visit: Payer: Self-pay

## 2021-11-30 ENCOUNTER — Emergency Department (HOSPITAL_COMMUNITY)
Admission: EM | Admit: 2021-11-30 | Discharge: 2021-12-01 | Disposition: A | Discharge status: Home / Self Care | Payer: Self-pay | Attending: Emergency Medicine | Admitting: Emergency Medicine

## 2021-11-30 ENCOUNTER — Encounter (HOSPITAL_COMMUNITY): Payer: Self-pay | Admitting: Emergency Medicine

## 2021-11-30 DIAGNOSIS — Z20822 Contact with and (suspected) exposure to covid-19: Secondary | ICD-10-CM | POA: Insufficient documentation

## 2021-11-30 DIAGNOSIS — F809 Developmental disorder of speech and language, unspecified: Secondary | ICD-10-CM | POA: Insufficient documentation

## 2021-11-30 DIAGNOSIS — Y900 Blood alcohol level of less than 20 mg/100 ml: Secondary | ICD-10-CM | POA: Insufficient documentation

## 2021-11-30 DIAGNOSIS — R7309 Other abnormal glucose: Secondary | ICD-10-CM | POA: Insufficient documentation

## 2021-11-30 DIAGNOSIS — F333 Major depressive disorder, recurrent, severe with psychotic symptoms: Secondary | ICD-10-CM | POA: Insufficient documentation

## 2021-11-30 DIAGNOSIS — F15159 Other stimulant abuse with stimulant-induced psychotic disorder, unspecified: Secondary | ICD-10-CM | POA: Insufficient documentation

## 2021-11-30 DIAGNOSIS — R45851 Suicidal ideations: Secondary | ICD-10-CM | POA: Insufficient documentation

## 2021-11-30 DIAGNOSIS — F1594 Other stimulant use, unspecified with stimulant-induced mood disorder: Secondary | ICD-10-CM | POA: Diagnosis present

## 2021-11-30 DIAGNOSIS — F29 Unspecified psychosis not due to a substance or known physiological condition: Secondary | ICD-10-CM

## 2021-11-30 DIAGNOSIS — R569 Unspecified convulsions: Secondary | ICD-10-CM | POA: Insufficient documentation

## 2021-11-30 LAB — CBC WITH DIFFERENTIAL/PLATELET
Abs Immature Granulocytes: 0.03 10*3/uL (ref 0.00–0.07)
Basophils Absolute: 0 10*3/uL (ref 0.0–0.1)
Basophils Relative: 0 %
Eosinophils Absolute: 0 10*3/uL (ref 0.0–0.5)
Eosinophils Relative: 0 %
HCT: 46.6 % (ref 39.0–52.0)
Hemoglobin: 16.1 g/dL (ref 13.0–17.0)
Immature Granulocytes: 0 %
Lymphocytes Relative: 13 %
Lymphs Abs: 0.9 10*3/uL (ref 0.7–4.0)
MCH: 33.8 pg (ref 26.0–34.0)
MCHC: 34.5 g/dL (ref 30.0–36.0)
MCV: 97.7 fL (ref 80.0–100.0)
Monocytes Absolute: 0.7 10*3/uL (ref 0.1–1.0)
Monocytes Relative: 9 %
Neutro Abs: 5.5 10*3/uL (ref 1.7–7.7)
Neutrophils Relative %: 78 %
Platelets: 174 10*3/uL (ref 150–400)
RBC: 4.77 MIL/uL (ref 4.22–5.81)
RDW: 11.8 % (ref 11.5–15.5)
WBC: 7.1 10*3/uL (ref 4.0–10.5)
nRBC: 0 % (ref 0.0–0.2)

## 2021-11-30 LAB — COMPREHENSIVE METABOLIC PANEL
ALT: 33 U/L (ref 0–44)
AST: 43 U/L — ABNORMAL HIGH (ref 15–41)
Albumin: 4.4 g/dL (ref 3.5–5.0)
Alkaline Phosphatase: 62 U/L (ref 38–126)
Anion gap: 14 (ref 5–15)
BUN: 14 mg/dL (ref 6–20)
CO2: 19 mmol/L — ABNORMAL LOW (ref 22–32)
Calcium: 9.4 mg/dL (ref 8.9–10.3)
Chloride: 107 mmol/L (ref 98–111)
Creatinine, Ser: 1.17 mg/dL (ref 0.61–1.24)
GFR, Estimated: >60 mL/min (ref >60)
Glucose, Bld: 82 mg/dL (ref 70–99)
Potassium: 3.5 mmol/L (ref 3.5–5.1)
Sodium: 140 mmol/L (ref 135–145)
Total Bilirubin: 3.5 mg/dL — ABNORMAL HIGH (ref 0.3–1.2)
Total Protein: 7.8 g/dL (ref 6.5–8.1)

## 2021-11-30 LAB — CBG MONITORING, ED: Glucose-Capillary: 113 mg/dL — ABNORMAL HIGH (ref 70–99)

## 2021-11-30 LAB — SALICYLATE LEVEL: Salicylate Lvl: <7 mg/dL — ABNORMAL LOW (ref 7.0–30.0)

## 2021-11-30 LAB — ETHANOL: Alcohol, Ethyl (B): <10 mg/dL (ref <10)

## 2021-11-30 LAB — ACETAMINOPHEN LEVEL: Acetaminophen (Tylenol), Serum: <10 ug/mL — ABNORMAL LOW (ref 10–30)

## 2021-11-30 MED ORDER — LEVETIRACETAM IN NACL 1500 MG/100ML IV SOLN
1500.0000 mg | Freq: Once | INTRAVENOUS | Status: AC
  Administered 2021-11-30: 1500 mg via INTRAVENOUS
  Filled 2021-11-30: qty 100

## 2021-11-30 MED ORDER — LEVETIRACETAM 500 MG PO TABS
1250.0000 mg | ORAL_TABLET | Freq: Two times a day (BID) | ORAL | Status: DC
  Administered 2021-12-01: 1250 mg via ORAL
  Filled 2021-11-30: qty 1

## 2021-11-30 MED ORDER — BICTEGRAVIR-EMTRICITAB-TENOFOV 50-200-25 MG PO TABS
1.0000 | ORAL_TABLET | Freq: Every day | ORAL | Status: DC
  Administered 2021-11-30 – 2021-12-01 (×2): 1 via ORAL
  Filled 2021-11-30 (×3): qty 1

## 2021-11-30 NOTE — ED Notes (Signed)
TTS counselor sent secure chat to this RN to check on pt. Pt was removing bp cuff from his arm and then pushed his meal tray to the side saying "I'm done". Pt not willing to talk or sit down. Pt left the room and headed toward the exit. Security notified.

## 2021-11-30 NOTE — ED Provider Notes (Signed)
[facility]-EMERGENCY DEPT Provider Note   CSN: [id number] Arrival date & time: 11/30/21  1430     History  Chief Complaint  Patient presents with   Altered Mental Status    Justin [name] is a 41 y.o. male.  41 year old male who presents with psychosis and suicidal ideations.  Patient has no history of behavior disturbance and has not been compliant with his medications.  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  EMS was called and patient given 5 mg of Haldol.  Patient admits to suicidal ideations at this time.  Is unclear of what the plan is.  Does have a history of suicide attempt in the past with hanging self      Home Medications Prior to Admission medications   Medication Sig Start Date End Date Taking? Authorizing Provider  bictegravir-emtricitabine-tenofovir AF (BIKTARVY) 50-200-25 MG TABS tablet Take 1 tablet by mouth daily. 10/29/21   Evlyn Kanner, MD  citalopram (CELEXA) 20 MG tablet Take 1 tablet (20 mg total) by mouth daily. Patient not taking: Reported on 09/21/2021 08/16/21   [name], MD  ibuprofen (ADVIL) 800 MG tablet Take 1 tablet (800 mg total) by mouth every 8 (eight) hours as needed for fever, headache, mild pain, moderate pain or cramping. Patient not taking: Reported on 09/21/2021 08/01/21   Elgergawy, Leana Roe, MD  levETIRAcetam (KEPPRA) 250 MG tablet Take 5 tablets (1,250 mg total) by mouth 2 (two) times daily. 11/22/21 12/22/21  Rolan Bucco, MD  loperamide (IMODIUM) 2 MG capsule Take 1 capsule (2 mg total) by mouth 4 (four) times daily as needed for diarrhea or loose stools. Patient not taking: Reported on 09/21/2021 07/26/21   [name], DO      Allergies    Patient has no known allergies.    Review of Systems   Review of Systems  Unable to perform ROS: Psychiatric disorder   Physical Exam Updated Vital Signs BP (!) 142/82   Pulse 73   Resp (!) 28   SpO2 94%  Physical Exam Vitals and nursing note  reviewed.  Constitutional:      General: He is not in acute distress.    Appearance: Normal appearance. He is well-developed. He is not toxic-appearing.  HENT:     Head: Normocephalic and atraumatic.  Eyes:     General: Lids are normal.     Conjunctiva/sclera: Conjunctivae normal.     Pupils: Pupils are equal, round, and reactive to light.  Neck:     Thyroid: No thyroid mass.     Trachea: No tracheal deviation.  Cardiovascular:     Rate and Rhythm: Normal rate and regular rhythm.     Heart sounds: Normal heart sounds. No murmur heard.   No gallop.  Pulmonary:     Effort: Pulmonary effort is normal. No respiratory distress.     Breath sounds: Normal breath sounds. No stridor. No decreased breath sounds, wheezing, rhonchi or rales.  Abdominal:     General: There is no distension.     Palpations: Abdomen is soft.     Tenderness: There is no abdominal tenderness. There is no rebound.  Musculoskeletal:        General: No tenderness. Normal range of motion.     Cervical back: Normal range of motion and neck supple.  Skin:    General: Skin is warm and dry.     Findings: No abrasion or rash.  Neurological:     Mental Status: He is alert and  oriented to person, place, and time. Mental status is at baseline.     GCS: GCS eye subscore is 4. GCS verbal subscore is 5. GCS motor subscore is 6.     Cranial Nerves: No cranial nerve deficit.     Sensory: No sensory deficit.     Motor: Motor function is intact.  Psychiatric:        Attention and Perception: He is inattentive.        Mood and Affect: Affect is blunt.        Speech: Speech is delayed.        Behavior: Behavior is slowed.    ED Results / Procedures / Treatments   Labs (all labs ordered are listed, but only abnormal results are displayed) Labs Reviewed  ETHANOL  COMPREHENSIVE METABOLIC PANEL  CBC WITH DIFFERENTIAL/PLATELET  RAPID URINE DRUG SCREEN, HOSP PERFORMED  SALICYLATE LEVEL  ACETAMINOPHEN LEVEL     EKG None  Radiology No results found.  Procedures Procedures    Medications Ordered in ED Medications - No data to display  ED Course/ Medical Decision Making/ A&P                           Medical Decision Making Amount and/or Complexity of Data Reviewed Labs: ordered.   The patient's medical clearance labs are pending at this time.  Will be signed out to next provider          Final Clinical Impression(s) / ED Diagnoses Final diagnoses:  None    Rx / DC Orders ED Discharge Orders     None         [name], MD 11/30/21 1631

## 2021-11-30 NOTE — ED Notes (Signed)
Pt back escorted back to room via security. MD notified of patients behavior. Will continue to monitor.
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
## 2021-11-30 NOTE — ED Triage Notes (Signed)
Pt BIB EMS from hotel, c/o erratic behavior. Witnessed kneeling down on hands and needs in front of the building. Diaphoretic and talking in incoherent sentences. Known behavioral health history and admitted to amphetamine use. 5 mg of haldol IM administered by EMS personal. Unable to place IV, collect CBG, and obtain EKG d/t behavior.   BP 167/95 P 128 RR 40

## 2021-11-30 NOTE — ED Provider Notes (Signed)
RN indicates pt with seizure in ED, hx same. Lasted 1-2 minutes. No trauma or fall with seizure, remains on stretcher.   Currently no seizure activity noted. Check cbg.   Keppra iv.      Cathren Laine, MD 11/30/21 2125   Recheck again ~ 1 hr post seizure. Pt awake, alert, responsive to questions. Denies any new symptoms or c/o. No headache. No cp or sob. No abd pain. CTLS spine, non tender, aligned, no step off. Chest cta. Motor/sens grossly intact bil. Vitals stable.   Pt has been restarted on his po meds.   Dispo per [facility] team.       Cathren Laine, MD 12/01/21 [phone]

## 2021-11-30 NOTE — BH Assessment (Addendum)
@  1943, requested patient's nurse to place the TTS machine in patient's room. Clinician called machine @1953 .

## 2021-12-01 ENCOUNTER — Ambulatory Visit (HOSPITAL_COMMUNITY)
Admission: EM | Admit: 2021-12-01 | Discharge: 2021-12-01 | Disposition: A | Discharge status: Home / Self Care | Payer: No Payment, Other | Attending: Psychiatry | Admitting: Psychiatry

## 2021-12-01 ENCOUNTER — Ambulatory Visit (HOSPITAL_COMMUNITY)
Admission: AD | Admit: 2021-12-01 | Discharge: 2021-12-01 | Disposition: A | Discharge status: Home / Self Care | Payer: No Payment, Other | Attending: Psychiatry | Admitting: Psychiatry

## 2021-12-01 ENCOUNTER — Emergency Department (HOSPITAL_COMMUNITY)
Admission: EM | Admit: 2021-12-01 | Discharge: 2021-12-01 | Disposition: A | Discharge status: Home / Self Care | Payer: Self-pay | Attending: Emergency Medicine | Admitting: Emergency Medicine

## 2021-12-01 ENCOUNTER — Other Ambulatory Visit: Payer: Self-pay

## 2021-12-01 DIAGNOSIS — F1594 Other stimulant use, unspecified with stimulant-induced mood disorder: Secondary | ICD-10-CM

## 2021-12-01 DIAGNOSIS — R569 Unspecified convulsions: Secondary | ICD-10-CM | POA: Insufficient documentation

## 2021-12-01 DIAGNOSIS — Z733 Stress, not elsewhere classified: Secondary | ICD-10-CM | POA: Insufficient documentation

## 2021-12-01 DIAGNOSIS — F1510 Other stimulant abuse, uncomplicated: Secondary | ICD-10-CM

## 2021-12-01 DIAGNOSIS — F1914 Other psychoactive substance abuse with psychoactive substance-induced mood disorder: Secondary | ICD-10-CM | POA: Insufficient documentation

## 2021-12-01 DIAGNOSIS — Z653 Problems related to other legal circumstances: Secondary | ICD-10-CM | POA: Insufficient documentation

## 2021-12-01 DIAGNOSIS — Z046 Encounter for general psychiatric examination, requested by authority: Secondary | ICD-10-CM | POA: Insufficient documentation

## 2021-12-01 DIAGNOSIS — F329 Major depressive disorder, single episode, unspecified: Secondary | ICD-10-CM | POA: Insufficient documentation

## 2021-12-01 LAB — RESP PANEL BY RT-PCR (FLU A&B, COVID) ARPGX2
Influenza A by PCR: NEGATIVE
Influenza B by PCR: NEGATIVE
SARS Coronavirus 2 by RT PCR: NEGATIVE

## 2021-12-01 MED ORDER — BIKTARVY 50-200-25 MG PO TABS: 1.0000 | ORAL_TABLET | Freq: Every day | ORAL | 0 refills | Status: DC

## 2021-12-01 MED ORDER — NICOTINE 14 MG/24HR TD PT24: 14.0000 mg | MEDICATED_PATCH | TRANSDERMAL | 0 refills | Status: DC

## 2021-12-01 MED ORDER — LEVETIRACETAM 250 MG PO TABS: 1250.0000 mg | ORAL_TABLET | Freq: Two times a day (BID) | ORAL | 0 refills | Status: DC

## 2021-12-01 MED ORDER — LEVETIRACETAM 500 MG PO TABS
1250.0000 mg | ORAL_TABLET | Freq: Once | ORAL | Status: AC
  Administered 2021-12-01: 1250 mg via ORAL
  Filled 2021-12-01: qty 1

## 2021-12-01 NOTE — Discharge Instructions (Signed)
You are previously seen by psychiatry twice today.  The provider, Ms. Allen at [facility], recommended that you follow-up with their with a scheduled appointment for further treatment as needed.  Either go there or call them for the appointment.

## 2021-12-01 NOTE — ED Provider Notes (Signed)
Behavioral Health Urgent Care Medical Screening Exam  Patient Name: Justin [name] MRN: [id number] Date of Evaluation: 12/01/21 Chief Complaint:   Diagnosis:  Final diagnoses:  Amphetamine-induced mood disorder ([place])    History of Present illness: Justin [name] is a 41 y.o. male. Patient presents voluntarily to [facility] behavioral health for walk-in assessment.  Patient is accompanied by a friend, he describes friend as his "advocate," who does not remain present during assessment.   Recent stressors include an upcoming court date on 12/29/2021 for resisting arrest. He shares that, worst case, he will likely receive probation for this criminal charge.   Justin [name] reports he is seeking residential substance use treatment. He has history of methamphetamine use disorder. He last used methamphetamine 3 days ago. Prior to 3 days ago he celebrated 100 days of sobriety after completing 30-day substance use treatment program at [facility]. Since graduating [facility] he has frequntly attended both NA and AA meetings.   He briefly relapsed on methamphetamine 3 days ago and now has intermittent auditory hallucinations. No hallucinations currently. He reports also feeling paranoia, both hallucinations and paranoia are lessening per patient report. He is insightful, understands that symptoms are likely related to substance use.   Patient reports he was briefly placed in a local hotel, by Mr Clearnce Sorrel with [facility] sober [facility], after relapse. He is uncertain if he is able to return to [facility] sober [facility], plans to reach out to facility administration later today.   Justin [name] has been diagnosed with substance-induced mood disorder and major depressive disorder. He is not currently linked with outpatient psychiatry. He endorses history of several previous inpatient psychiatric hospitalizations.   Patient is assessed face-to-face by nurse practitioner.  He is seated in assessment  area, no acute distress.  He is alert and oriented, pleasant and cooperative during assessment.  He presents with euthymic mood, congruent affect. No suicidal or homicidal ideations reported.  He easily contracts verbally for safety with this Probation officer.   He has normal speech and behavior.  He denies visual hallucinations.  Patient is able to converse coherently with goal-directed thoughts and no distractibility or preoccupation.  Objectively there is no evidence of psychosis/mania or delusional thinking.  Justin [name] currently resides in a hotel in [place]. He denies access to weapons. He was recently separated from his employment after relapse.  Patient endorses average sleep and appetite.  Patient offered support and encouragement. He verbalizes plan to potentially seek residential substance use treatment in an out-of-state program after speaking with a friend who can assist with logistics to assist with admission to out-of-state residential substance use facility. Patient would like to change his "people, places and things."     Psychiatric Specialty Exam  Presentation  General Appearance:Appropriate for Environment; Casual  Eye Contact:Good  Speech:Clear and Coherent; Normal Rate  Speech Volume:Normal  Handedness:Right   Mood and Affect  Mood:Euthymic  Affect:Congruent   Thought Process  Thought Processes:Coherent; Goal Directed; Linear  Descriptions of Associations:Intact  Orientation:Full (Time, Place and Person)  Thought Content:WDL; Logical  Diagnosis of Schizophrenia or Schizoaffective disorder in past: Yes   Hallucinations:Auditory "I hear voices"  Ideas of Reference:Paranoia  Suicidal Thoughts:No  Homicidal Thoughts:No   Sensorium  Memory:Immediate Good; Recent Fair  Judgment:Fair  Insight:Fair   Executive Functions  Concentration:Good  Attention Span:Good  [place]   Psychomotor Activity   Psychomotor Activity:Normal   Assets  Assets:Desire for Improvement; Armed forces logistics/support/administrative officer; Leisure Time; Physical  Health; Resilience; Social Support   Sleep  Sleep:Good  Number of hours: No data recorded  No data recorded  Physical Exam: Physical Exam Vitals and nursing note reviewed.  Constitutional:      Appearance: Normal appearance. He is well-developed and normal weight.  HENT:     Head: Normocephalic and atraumatic.     Nose: Nose normal.  Cardiovascular:     Rate and Rhythm: Normal rate.  Pulmonary:     Effort: Pulmonary effort is normal.  Musculoskeletal:        General: Normal range of motion.  Skin:    General: Skin is warm and dry.  Neurological:     Mental Status: He is alert and oriented to person, place, and time.  Psychiatric:        Attention and Perception: Attention and perception normal.        Mood and Affect: Mood and affect normal.        Speech: Speech normal.        Behavior: Behavior normal. Behavior is cooperative.        Thought Content: Thought content normal.        Cognition and Memory: Cognition and memory normal.   Review of Systems  Constitutional: Negative.   HENT: Negative.    Eyes: Negative.   Respiratory: Negative.    Cardiovascular: Negative.   Gastrointestinal: Negative.   Genitourinary: Negative.   Musculoskeletal: Negative.   Skin: Negative.   Neurological: Negative.   Endo/Heme/Allergies: Negative.   Psychiatric/Behavioral:  Positive for substance abuse.   Blood pressure 110/72, pulse 70, temperature 98.2 F (36.8 C), temperature source Oral, resp. rate 18, SpO2 100 %. There is no height or weight on file to calculate BMI.  Musculoskeletal: Strength & Muscle Tone: within normal limits Gait & Station: normal Patient leans: N/A   [place] [facility] Discharge Disposition for Follow up and Recommendations: Based on my evaluation the patient does not appear to have an emergency medical condition and can be discharged with  resources and follow up care in outpatient services for Medication Management, Substance Abuse Intensive Outpatient Program, and Individual Therapy Patient reviewed with Dr Serafina Mitchell.  Follow up with outpatient psychiatry resources.  Follow up with substance use treatment resources provided.  Lucky Rathke, FNP 12/01/2021, 3:44 PM

## 2021-12-01 NOTE — Discharge Summary (Addendum)
[facility] Psych ED Discharge  12/01/2021 11:43 AM Justin [name]  MRN:  [id number]  Principal Problem: Amphetamine-induced mood disorder [facility]) Discharge Diagnoses: Principal Problem:   Amphetamine-induced mood disorder (HCC)  Clinical Impression:  Final diagnoses:  Psychosis, unspecified psychosis type (HCC)   Subjective: AA male, 41 years old brought in by EMS from a Hotel yesterday for erratic behavior knelling down on his hands talking incoherently.  He admitted he used Amphetamine. This morning patient was seen in the ER resting.  He had an episode of Seizure yesterday evening and received IV Keppra.  He reported that he does not see a Neurologist for his seizure disorder.  He has no outpatient Psychiatrist for Depression.  He admitted to using Methamphetamine and has been doing so for long.  He admitted to feeling suicidal and paranoid after using Methamphetamine.  He also reported hearing voices when ever he is under the influence of Amphetamine.  He denied Alcohol use in the past couple of weeks.  He ready to seek Long term substance abuse treatment stating he is tired of feeling the way he feels after using Methamphetamine.  He vehemently denied feeling suicidal or homicidal.  Provider informed patient the process is to get him information and phone numbers to Long term substance abuse treatment facilities.  He is also advised to call them and seek for placement when bed is available.  SW consult put in for assistance with securing treatment facilities and phone numbers.  Patient is discharged and encouraged to engage in outpatient [facility] and Neurologist for Seizure disorder. ADDENDUM:   Dr Lucianne Muss states patient need to use resources offered him to secure Long term substance abuse treatment.  [facility] does not offer Long term substance treatment.  Patient cannot stay in the ER looking for a facility that offers such treatment.  During my evaluation he denied SI/HI and stated that under the influence of  Methamphetamine he does hear voices which some times he cannot make out what the voices are saying.  He is discharged after review by DR Lucianne Muss ED Assessment Time Calculation: Start Time: 1106 Stop Time: 1142 Total Time in Minutes (Assessment Completion): 36   Past Psychiatric History: MDD, Methamphetamine abuse  Past Medical History:  Past Medical History:  Diagnosis Date   Chronic hepatitis C without hepatic coma (HCC) 08/17/2017   Endocarditis    Hepatitis C    History of syphilis 08/25/2014   Treated by [facility] 07-2014.   HIV (human immunodeficiency virus infection) (HCC)    HIV (human immunodeficiency virus infection) (HCC)    Seizures (HCC)     Past Surgical History:  Procedure Laterality Date   hand surgery Right    LUMBAR PUNCTURE  08/08/2019       WRIST SURGERY     Family History:  Family History  Adopted: Yes  Problem Relation Age of Onset   Diabetes type II Mother    Family Psychiatric  History: unknown Social History:  Social History   Substance and Sexual Activity  Alcohol Use Not Currently   Comment: in rehab     Social History   Substance and Sexual Activity  Drug Use Not Currently   Types: Marijuana, Methamphetamines   Comment: in rehab    Social History   Socioeconomic History   Marital status: Single    Spouse name: Not on file   Number of children: Not on file   Years of education: Not on file   Highest education level: Not on file  Occupational History   Not on file  Tobacco Use   Smoking status: Former    Types: Cigarettes   Smokeless tobacco: Never  Vaping Use   Vaping Use: Never used  Substance and Sexual Activity   Alcohol use: Not Currently    Comment: in rehab   Drug use: Not Currently    Types: Marijuana, Methamphetamines    Comment: in rehab   Sexual activity: Yes    Partners: Male    Birth control/protection: Condom    Comment: given condoms  Other Topics Concern   Not on file  Social History Narrative   ** Merged  History Encounter **       ** Merged History Encounter **       ** Merged History Encounter **       Social Determinants of Corporate investment bankerHealth   Financial Resource Strain: Not on file  Food Insecurity: Not on file  Transportation Needs: Not on file  Physical Activity: Not on file  Stress: Not on file  Social Connections: Not on file    Tobacco Cessation:  A prescription for an FDA-approved tobacco cessation medication provided at discharge  Current Medications: Current Facility-Administered Medications  Medication Dose Route Frequency Provider Last Rate Last Admin   bictegravir-emtricitabine-tenofovir AF (BIKTARVY) 50-200-25 MG per tablet 1 tablet  1 tablet Oral Daily Lorre NickAllen, Anthony, MD   1 tablet at 12/01/21 1010   levETIRAcetam (KEPPRA) tablet 1,250 mg  1,250 mg Oral BID Lorre NickAllen, Anthony, MD   1,250 mg at 12/01/21 [id number]   Current Outpatient Medications  Medication Sig Dispense Refill   nicotine (NICODERM CQ - DOSED IN MG/24 HOURS) 14 mg/24hr patch Place 1 patch (14 mg total) onto the skin daily. 30 patch 0   [START ON 12/02/2021] bictegravir-emtricitabine-tenofovir AF (BIKTARVY) 50-200-25 MG TABS tablet Take 1 tablet by mouth daily. 30 tablet 0   citalopram (CELEXA) 20 MG tablet Take 1 tablet (20 mg total) by mouth daily. (Patient not taking: Reported on 11/30/2021) 30 tablet 5   levETIRAcetam (KEPPRA) 250 MG tablet Take 5 tablets (1,250 mg total) by mouth 2 (two) times daily. 300 tablet 0   PTA Medications: (Not in a hospital admission)   [place] Scale:  Flowsheet Row [facility] Hosp-Admission (Discharged) [facility]  C-SSRS RISK CATEGORY No Risk No Risk No Risk       Musculoskeletal: Strength & Muscle Tone:  seen bed resting Gait & Station:  sen in bed resting Patient leans:  see above.  Psychiatric Specialty  Exam: Presentation  General Appearance: Casual; Neat  Eye Contact:Good  Speech:Clear and Coherent; Normal Rate  Speech Volume:Normal  Handedness:Right   Mood and Affect  Mood:Euthymic  Affect:Congruent   Thought Process  Thought Processes:Coherent; Goal Directed; Linear  Descriptions of Associations:Intact  Orientation:Full (Time, Place and Person)  Thought Content:Logical  History of Schizophrenia/Schizoaffective disorder:Yes  Duration of Psychotic Symptoms:N/A  Hallucinations:Hallucinations: Auditory Description of Auditory Hallucinations: chatering noises-cannot make out what voices are saying.  Ideas of Reference:Paranoia; Delusions  Suicidal Thoughts:Suicidal Thoughts: No  Homicidal Thoughts:Homicidal Thoughts: No   Sensorium  Memory:Immediate Good; Recent Good; Remote Good  Judgment:Fair  Insight:Good   Executive Functions  Concentration:Good  Attention Span:Good  Recall:Good  Fund of Knowledge:Good  Language:Good   Psychomotor Activity  Psychomotor Activity:Psychomotor Activity: Normal   Assets  Assets:Communication Skills; Desire for Improvement   Sleep  Sleep:Sleep: Fair    Physical Exam: Physical Exam Vitals and nursing note reviewed.  Constitutional:      Appearance: Normal appearance.  HENT:     Head: Normocephalic and atraumatic.     Nose: Nose normal.  Cardiovascular:     Rate and Rhythm: Normal rate.  Pulmonary:     Effort: Pulmonary effort is normal.  Musculoskeletal:        General: Normal range of motion.     Cervical back: Normal range of motion.  Skin:    General: Skin is warm and dry.  Neurological:     General: No focal deficit present.     Mental Status: He is alert and oriented to person, place, and time.   Review of Systems  Constitutional: Negative.   HENT: Negative.    Eyes: Negative.   Respiratory: Negative.    Cardiovascular: Negative.   Gastrointestinal: Negative.   Genitourinary:  Negative.   Musculoskeletal: Negative.   Skin: Negative.   Neurological:  Positive for seizures.  Endo/Heme/Allergies: Negative.   Psychiatric/Behavioral:  Positive for depression and substance abuse.   Blood pressure 120/74, pulse 75, temperature 98.1 F (36.7 C), temperature source Oral, resp. rate 19, SpO2 97 %. There is no height or weight on file to calculate BMI.   Demographic Factors:  Male, Adolescent or young adult, Living alone, and Unemployed  Loss Factors: Decrease in vocational status and Financial problems/change in socioeconomic status  Historical Factors: NA  Risk Reduction Factors:   Positive coping skills or problem solving skills  Continued Clinical Symptoms:  Depression:   Insomnia Alcohol/Substance Abuse/Dependencies Epilepsy  Cognitive Features That Contribute To Risk:  None    Suicide Risk:  Minimal: No identifiable suicidal ideation.  Patients presenting with no risk factors but with morbid ruminations; may be classified as minimal risk based on the severity of the depressive symptoms    Plan Of Care/Follow-up recommendations:  Activity:  as tolerated Diet:  Regular  Medical Decision Making: Patient does not meet criteria for inpatient hospitalization.  Information is given for Long term substance abuse treatment at his request,.  He is advised to call the facilities for open bed .  Problem 1: Amphetamine mood induced mood disorder  Problem 2: MDD, Recurrent, moderate   Disposition: Discharge  Earney Navy, NP-PMHNP-BC 12/01/2021, 11:43 AM

## 2021-12-01 NOTE — ED Notes (Signed)
Waiting to here back from patients advocate. Once contact is made with advocate Reginold Agent NP would like contact information.   Social work consult placed by NP for patient.

## 2021-12-01 NOTE — Progress Notes (Signed)
TOC CSW provided substance abuse resource and two buss passes to pt for d/c. TOC sign off.   Valentina Shaggy.Kaulana Brindle, MSW, LCSWA [facility]  Transitions of Care Clinical Social Worker I Direct Dial: [phone]  Fax: [phone] [email or web address]

## 2021-12-01 NOTE — ED Triage Notes (Signed)
Patient presents due to complaint of seizures. He reports having one last night and then hearing voices there after. He reports, "They make me hear things and stuff, kinda crazy what I've been hearing here lately." He hears people screaming for help, but when he tries to find them, he can't. He is looking for relief because he reports the voices scare and depress him. "I just want to be out of this state."  He admits to taking drugs yesterday, but he believes they may have been laced with something.

## 2021-12-01 NOTE — ED Provider Notes (Signed)
Emergency Medicine Observation Re-evaluation Note  Justin [name] is a 41 y.o. male, seen on rounds today.  Pt initially presented to the ED for complaints of Altered Mental Status Currently, the patient is resting in bed, waiting on psych disposition.  Physical Exam  BP 120/74   Pulse 75   Temp 98.1 F (36.7 C) (Oral)   Resp 19   SpO2 97%  Physical Exam General: resting comfortably, no acute distress Cardiac: normal HR Lungs: normal effort Psych: no acute agitation  ED Course / MDM  EKG:   I have reviewed the labs performed to date as well as medications administered while in observation.  Recent changes in the last 24 hours include home meds/keppra being given.  Plan  Current plan is for discharge. Cleared by psychiatry. I will rescind IVC.  Of note, had a seizure last night and was given IV Keppra.  Restarted on Keppra and he has been given a prescription for Keppra and HIV meds by psychiatry.  Justin [name] is not under involuntary commitment.     Justin Loveless, MD 12/01/21 1150

## 2021-12-01 NOTE — ED Notes (Signed)
Pt noted laying in stretcher with arms crossed around chest and seizure like movements. Pt then jumped off stretcher and start to drool on the floor and then jumped back onto stretcher over the side rails. Pt placed on cardiac monitor at this time, vitals taken, oxygen applied, seizure pads applied to stretcher. MD notified, new orders received, will continue to monitor.

## 2021-12-01 NOTE — BH Assessment (Signed)
NP met with pt before triage. Briefly, pt discharged from [facility] and recommended to follow up with outpt services. Pt presenting to [facility] voluntarily with reports of auditory hallucinations and paranoia related to recent meth use.

## 2021-12-01 NOTE — Discharge Instructions (Addendum)
Patient is instructed prior to discharge to:  Take all medications as prescribed by his/her mental healthcare provider. Report any adverse effects and or reactions from the medicines to his/her outpatient provider promptly. Keep all scheduled appointments, to ensure that you are getting refills on time and to avoid any interruption in your medication.  If you are unable to keep an appointment call to reschedule.  Be sure to follow-up with resources and follow-up appointments provided.  Patient has been instructed & cautioned: To not engage in alcohol and or illegal drug use while on prescription medicines. In the event of worsening symptoms, patient is instructed to call the crisis hotline, 911 and or go to the nearest ED for appropriate evaluation and treatment of symptoms. To follow-up with his/her primary care provider for your other medical issues, concerns and or health care needs.    Substance Abuse Treatment Resources listed Below:  [facility] - Admissions are currently completed Monday through Friday at 8am; both appointments and walk-ins are accepted.  Any individual that is a [place] resident may present for a substance abuse screening and assessment for admission.  A person may be referred by numerous sources or self-refer.   Potential clients will be screened for medical necessity and appropriateness for the program.  Clients must meet criteria for high-intensity residential treatment services.  If clinically appropriate, a client will continue with the comprehensive clinical assessment and intake process, as well as enrollment in the [facility].  Address: [address] [place], [place] [address] Admin Hours: Mon-Fri 8AM to 5PM Center Hours: 24/7 Phone: [phone] Fax: [phone]  [facility] Address: [address], [place], [place] [address] Behavioral Health Urgent Care ([facility]) Hours: 24/7 Phone:  [phone] Fax: [phone]  [facility]): (offers outpatient therapy and intensive outpatient substance abuse therapy).  [address], [place], [place] [address] Phone: [phone]  [facility]: Offers FREE recovery skills classes, support groups, 1:1 Peer Support, and Compeer Classes. [address], [place], [place] [address] Phone: [phone] (Call to complete intake).   [facility] [address] [place], [place] [address] Phone: [phone] ext [phone] [facility] provides food, shelter and other programs and services to the homeless men of [place] through our men's program.  By offering safe shelter, three meals a day, clean clothing, Biblical counseling, financial planning, vocational training, GED/education and employment assistance, we've helped mend the shattered lives of many homeless men since opening in 1974.  We have approximately 267 beds available, with a max of 312 beds including mats for emergency situations and currently house an average of 270 men a night.  Prospective Client Check-In Information Photo ID Required (State/ Out of State/ DOC) - if photo ID is not available, clients are required to have a printout of a police/sheriff's criminal history report. Help out with chores around the Mission. No sex offender of any type (pending, charged, registered and/or any other sex related offenses) will be permitted to check in. Must be willing to abide by all rules, regulations, and policies established by the [facility]. The following will be provided - shelter, food, clothing, and biblical counseling. If you or someone you know is in need of assistance at our men's shelter in [place], [place], please call [phone] ext. [phone].  [facility]-will provide timely access to mental health services for children and adolescents (4-17) and adults    presenting in a mental health crisis. The program is designed for those who need urgent Behavioral Health or Substance Use treatment and are not experiencing a medical crisis that would typically require an emergency room visit.    [address] [place], [place] [address] Phone: [phone] [email or web address]  [facility]: Phone#: [phone]  The [facility] SA Intensive Outpatient Program (SAIOP) means structured individual and group addiction activities and services that are provided at an outpatient program designed to assist adult and adolescent consumers to begin recovery and learn skills for recovery maintenance. The [facility] SAIOP program is offered at least 3 hours a day, 3 days a week.SAIOP services shall include a structured program consisting of, but not limited to, the following services: Individual counseling and support; Group counseling and support; Family counseling, training or support; Biochemical assays to identify recent drug use (e.g., urine drug screens); Strategies for relapse prevention to include community and social support systems in treatment; Life skills; Crisis contingency planning; Disease Management; and Treatment support activities that have been adapted or specifically designed for persons with physical disabilities, or persons with co-occurring disorders of mental illness and substance abuse/dependence or mental retardation/developmental disability and substance abuse/dependence. Phone: [phone]  Address:   [facility] [place] [facility] will also offer the following outpatient services: (Monday through Friday 8am-5pm)   Partial Hospitalization Program (PHP) Substance Abuse Intensive Outpatient Program (SA-IOP) Group Therapy Medication Management Peer Living Room We also provide (24/7):  Assessments: Our mental health clinician and providers will conduct a focused mental health evaluation, assessing for immediate  safety concerns and further mental health needs. Referral: Our team will provide resources and help connect to community based mental health treatment, when indicated, including psychotherapy, psychiatry, and other specialized behavioral health or substance use disorder services (for those not already in treatment). Transitional Care: Our team providers in person bridging and/or telephonic follow-up during the patient's transition to outpatient services.  [facility] 24-Hour Call Center: 1-800-256-2452 Behavioral Health Crisis Line: [phone]  

## 2021-12-01 NOTE — H&P (Signed)
Behavioral Health Medical Screening Exam  HPI: Justin [name] is an A. A. 40 y.o. male who present voluntarily as a walk-in accompanied by a friend, he describes as his "advocate," to [facility] from [facility] for worsening auditory and visual hallucinations in the context of methamphetamine use and homelessness. Pt has PPHx / PMHx of Polysubstance use, Anxiety, MDD, HIV, Venereal disease, Substance induce mood disorder, and Seizures disorder.   Reported recent stressors include an upcoming court date on 12/29/2021 for resisting arrest. He shares that, worst case, he will likely receive probation for this criminal charge.   Reported he is seeking residential substance use treatment. Has history of methamphetamine use disorder with last use of 1 GM yesterday.  Pt celebrated 100 days of sobriety after completing 30-day substance use treatment program at [facility]. Since graduating [employer] he has frequntly attended both NA and AA meetings as reported by the advocate.  Advocate reported that he relapsed on methamphetamine 11/2 weeks ago and now has intermittent auditory / visual hallucinations. He reports also feeling paranoia. While taking VS, asked, "Are you putting poison in my mouth?"   Reported he was at [facility] for seizures treatment. Then he went to [facility] for detox for methamphetamine use and hallucinations but was unable to obtain the help he needed. The Advocate then brought him to [facility].  Endorsed  SI, HI, AVH, and sleeping for 8 hours last night. Rated anxiety as 10/10 on a scale of 0 to 10.  Endorsed family hx of mental illness. Mother diagnosed with bipolar d/o and brother diagnosed with schizophrenia, bipolar d/o, depression and anxiety.  Patient assessed sitting in the screen room. Chart reviewed and findings shared with the tx team and discussed with Dr. Lucianne Muss. Alert and oriented x 4. Agitated occasionally during the encounter. Mood anxious, euthymic, depressed and irritable. Affect  congruent. Unable to contract for  safety with this Provider. Made patient and Advocate aware that this provider planned to send him to [facility] since there is no bed availability at [facility]. He became very agitated and wanted to leave AMA. Dr. Octavia Bruckner and Security talked with patient and convinced him to go to [facility]. Report called to the Charge nurse at [facility]. Patient and Advocate left to [facility] via [facility] without further incident.    Total Time spent with patient: 1 hour  Psychiatric Specialty Exam:  Presentation  General Appearance: Appropriate for Environment; Casual; Fairly Groomed  Eye Contact:Good  Speech:Clear and Coherent; Pressured  Speech Volume:Normal  Handedness:Right  Mood and Affect  Mood:Anxious; Euthymic; Depressed; Irritable  Affect:Congruent  Thought Process  Thought Processes:Coherent; Linear  Descriptions of Associations:Intact  Orientation:Full (Time, Place and Person)  Thought Content:Logical; WDL  History of Schizophrenia/Schizoaffective disorder:Yes  Duration of Psychotic Symptoms:N/A  Hallucinations:Hallucinations: Auditory; Visual Description of Auditory Hallucinations: Hear Voices to cut his arm Description of Visual Hallucinations: Seeing dead bodies  Ideas of Reference:Paranoia  Suicidal Thoughts:Suicidal Thoughts: No  Homicidal Thoughts:Homicidal Thoughts: No   Sensorium  Memory:Immediate Fair; Recent Fair; Remote Fair  Judgment:Fair  Insight:Fair  Executive Functions  Concentration:Good  Attention Span:Good  Recall:Good  Fund of Knowledge:Fair  Language:Good  Psychomotor Activity  Psychomotor Activity:Psychomotor Activity: Normal; Restlessness  Assets  Assets:Communication Skills; Physical Health; Social Support  Sleep  Sleep:Sleep: Good Number of Hours of Sleep: 8  Physical Exam: Physical Exam Vitals and nursing note reviewed.  Constitutional:      Appearance: Normal appearance.  HENT:     Head:  Normocephalic and atraumatic.  Right Ear: External ear normal.     Left Ear: External ear normal.     Nose: Nose normal.     Mouth/Throat:     Mouth: Mucous membranes are moist.     Pharynx: Oropharynx is clear.  Eyes:     Extraocular Movements: Extraocular movements intact.     Conjunctiva/sclera: Conjunctivae normal.     Pupils: Pupils are equal, round, and reactive to light.  Cardiovascular:     Rate and Rhythm: Normal rate.     Pulses: Normal pulses.  Pulmonary:     Effort: Pulmonary effort is normal.  Abdominal:     Palpations: Abdomen is soft.  Genitourinary:    Comments: deferred Musculoskeletal:        General: Normal range of motion.     Cervical back: Normal range of motion and neck supple.  Skin:    General: Skin is warm.  Neurological:     General: No focal deficit present.     Mental Status: He is alert and oriented to person, place, and time.  Psychiatric:     Comments: Agitated   Review of Systems  Constitutional: Negative.  Negative for chills and fever.  HENT: Negative.  Negative for hearing loss and tinnitus.   Eyes: Negative.  Negative for blurred vision and double vision.  Respiratory: Negative.  Negative for cough and hemoptysis.   Cardiovascular: Negative.  Negative for chest pain and palpitations.  Gastrointestinal:  Negative for abdominal pain, constipation, diarrhea, heartburn, nausea and vomiting.  Genitourinary: Negative.  Negative for dysuria, frequency and urgency.  Musculoskeletal: Negative.  Negative for back pain, joint pain, myalgias and neck pain.  Skin: Negative.  Negative for itching and rash.  Neurological:  Positive for seizures (Hx of Drug induced seizures). Negative for dizziness, tingling, tremors, sensory change and headaches.  Endo/Heme/Allergies: Negative.  Negative for environmental allergies and polydipsia. Does not bruise/bleed easily.  Psychiatric/Behavioral:  Positive for depression, hallucinations, substance abuse and  suicidal ideas. The patient is nervous/anxious.   There were no vitals taken for this visit. There is no height or weight on file to calculate BMI.  Musculoskeletal: Strength & Muscle Tone: within normal limits Gait & Station: normal Patient leans: N/A   Recommendations:  Based on my evaluation the patient appears to have an emergency Psychiatric condition for which I recommend the patient be transferred to the emergency department for further evaluation.  Cecilie Lowers, FNP 12/01/2021, 7:37 PM

## 2021-12-01 NOTE — ED Provider Notes (Signed)
[facility]-EMERGENCY DEPT Provider Note   CSN: [id number] Arrival date & time: 12/01/21  1952     History  Chief Complaint  Patient presents with   Seizures        Psychiatric Evaluation    Justin [name] is a 41 y.o. male.  HPI Patient returns today seeking psychiatric care.  He was seen earlier today at the  [facility].  He told them there that he was trying to get some help with substance abuse, by being admitted to a psychiatric facility.  He was evaluated and discharged to continue with his current home treatment plan.  He was evaluated in the ED yesterday after using methamphetamine.  He reports he had a seizure yesterday.  He was treated here with IV Keppra.  He was seen and evaluated by TTS, and discharged earlier today.  He was in the ED and discharged on 11/22/2021 after treatment for seizure.     Home Medications Prior to Admission medications   Medication Sig Start Date End Date Taking? Authorizing Provider  bictegravir-emtricitabine-tenofovir AF (BIKTARVY) 50-200-25 MG TABS tablet Take 1 tablet by mouth daily. 12/02/21 01/01/22  Earney Navy, NP  citalopram (CELEXA) 20 MG tablet Take 1 tablet (20 mg total) by mouth daily. Patient not taking: Reported on 11/30/2021 08/16/21   [name], MD  levETIRAcetam (KEPPRA) 250 MG tablet Take 5 tablets (1,250 mg total) by mouth 2 (two) times daily. 12/01/21 12/31/21  Earney Navy, NP  nicotine (NICODERM CQ - DOSED IN MG/24 HOURS) 14 mg/24hr patch Place 1 patch (14 mg total) onto the skin daily. 12/01/21 12/31/21  Earney Navy, NP      Allergies    Patient has no known allergies.    Review of Systems   Review of Systems  Physical Exam Updated Vital Signs BP 117/79 (BP Location: Right Arm)   Pulse 85   Temp 98 F (36.7 C) (Oral)   Resp 18   SpO2 99%  Physical Exam Vitals and nursing note reviewed.  Constitutional:      General: He is not in acute distress.    Appearance: He is  well-developed. He is not ill-appearing or diaphoretic.  HENT:     Head: Normocephalic and atraumatic.     Right Ear: External ear normal.     Left Ear: External ear normal.  Eyes:     Conjunctiva/sclera: Conjunctivae normal.     Pupils: Pupils are equal, round, and reactive to light.  Neck:     Trachea: Phonation normal.  Cardiovascular:     Rate and Rhythm: Normal rate.  Pulmonary:     Effort: Pulmonary effort is normal.  Abdominal:     General: There is no distension.     Palpations: Abdomen is soft.  Musculoskeletal:        General: Normal range of motion.     Cervical back: Normal range of motion and neck supple.  Skin:    General: Skin is warm and dry.  Neurological:     Mental Status: He is alert and oriented to person, place, and time.     Cranial Nerves: No cranial nerve deficit.     Sensory: No sensory deficit.     Motor: No abnormal muscle tone.     Coordination: Coordination normal.  Psychiatric:        Mood and Affect: Mood normal.        Behavior: Behavior normal.        Thought  Content: Thought content normal.        Judgment: Judgment normal.    ED Results / Procedures / Treatments   Labs (all labs ordered are listed, but only abnormal results are displayed) Labs Reviewed - No data to display  EKG None  Radiology No results found.  Procedures Procedures    Medications Ordered in ED Medications - No data to display  ED Course/ Medical Decision Making/ A&P Clinical Course as of 12/01/21 2204  Wed Dec 01, 2021  2057 Patient requested a dose of Keppra.  I ordered his evening medication. [EW]  2057 Patient insists that he was sent here by be held for treatment.  According to the EMR, including the AVS, he was seen and discharged with instructions to set an appointment to return for further care. [EW]    Clinical Course User Index [EW] Mancel Bale, MD                           Medical Decision Making Patient presenting for persistent  psychiatric symptoms with substance abuse and desire for help with avoiding illegal substances.  He states that he hears voices, but he is not showing other signs of internal responsiveness, mania, or depression.  He is asking for Keppra, so that he does not have a another seizure.  Risk Decision regarding hospitalization. Risk Details: Patient with no substance abuse and chronic psychiatric disease.  He has been evaluated twice today by psychiatry and discharged both times.  He was in the ED overnight.  Looks much better than his usual times when he has unstable psychosis.  After initially denying being evaluated today, I showed him the AVS from the discharge, and he then relented and agreed that he could be discharged.  He is not showing signs of unstable psychosis, acute unstable intoxication or any need for psychiatric or hospital care.  Discharged in stable condition.  Advised him to follow-up at [facility], as previously recommended           Final Clinical Impression(s) / ED Diagnoses Final diagnoses:  None    Rx / DC Orders ED Discharge Orders     None         Mancel Bale, MD 12/02/21 0002

## 2021-12-01 NOTE — ED Notes (Addendum)
Sitter made aware of the need for urine collection. Pt asleep at this time. Will collect urine and PCR swab once pt is awake.   Per TTS note, unable to perform TTS at last consult. Will continue to monitor for next TTS consult.   Belongings secured in 1-4 cabinets at nurses station.

## 2021-12-02 ENCOUNTER — Telehealth: Payer: Self-pay

## 2021-12-02 NOTE — Telephone Encounter (Signed)
Patient left voicemail to triage with pharmacy concerns. Attempted to return patient's call to resolve, but patient did not answer, unable to leave a voicemail. Patient currently has refills on file for his Biktarvy at [facility] #[id number] - [name], [place] - [address] AT [facility] [address] & [place] [address] [place] [address]  Phone:  [phone]  Fax:  [phone]   If he returns call back please advise him of this information. Valarie Cones

## 2021-12-04 ENCOUNTER — Other Ambulatory Visit: Payer: Self-pay

## 2021-12-04 ENCOUNTER — Emergency Department (HOSPITAL_COMMUNITY)
Admission: EM | Admit: 2021-12-04 | Discharge: 2021-12-04 | Disposition: A | Discharge status: Home / Self Care | Payer: Self-pay | Attending: Emergency Medicine | Admitting: Emergency Medicine

## 2021-12-04 ENCOUNTER — Encounter (HOSPITAL_COMMUNITY): Payer: Self-pay

## 2021-12-04 DIAGNOSIS — R748 Abnormal levels of other serum enzymes: Secondary | ICD-10-CM | POA: Insufficient documentation

## 2021-12-04 DIAGNOSIS — F1910 Other psychoactive substance abuse, uncomplicated: Secondary | ICD-10-CM

## 2021-12-04 DIAGNOSIS — Z21 Asymptomatic human immunodeficiency virus [HIV] infection status: Secondary | ICD-10-CM | POA: Insufficient documentation

## 2021-12-04 DIAGNOSIS — E876 Hypokalemia: Secondary | ICD-10-CM | POA: Insufficient documentation

## 2021-12-04 DIAGNOSIS — R410 Disorientation, unspecified: Secondary | ICD-10-CM | POA: Insufficient documentation

## 2021-12-04 LAB — COMPREHENSIVE METABOLIC PANEL
ALT: 30 U/L (ref 0–44)
AST: 34 U/L (ref 15–41)
Albumin: 4.9 g/dL (ref 3.5–5.0)
Alkaline Phosphatase: 64 U/L (ref 38–126)
Anion gap: 9 (ref 5–15)
BUN: 14 mg/dL (ref 6–20)
CO2: 25 mmol/L (ref 22–32)
Calcium: 9.9 mg/dL (ref 8.9–10.3)
Chloride: 106 mmol/L (ref 98–111)
Creatinine, Ser: 1.26 mg/dL — ABNORMAL HIGH (ref 0.61–1.24)
GFR, Estimated: >60 mL/min (ref >60)
Glucose, Bld: 87 mg/dL (ref 70–99)
Potassium: 3.2 mmol/L — ABNORMAL LOW (ref 3.5–5.1)
Sodium: 140 mmol/L (ref 135–145)
Total Bilirubin: 2 mg/dL — ABNORMAL HIGH (ref 0.3–1.2)
Total Protein: 7.9 g/dL (ref 6.5–8.1)

## 2021-12-04 LAB — CBC WITH DIFFERENTIAL/PLATELET
Abs Immature Granulocytes: 0.01 10*3/uL (ref 0.00–0.07)
Basophils Absolute: 0 10*3/uL (ref 0.0–0.1)
Basophils Relative: 1 %
Eosinophils Absolute: 0 10*3/uL (ref 0.0–0.5)
Eosinophils Relative: 1 %
HCT: 46.5 % (ref 39.0–52.0)
Hemoglobin: 16 g/dL (ref 13.0–17.0)
Immature Granulocytes: 0 %
Lymphocytes Relative: 42 %
Lymphs Abs: 2.2 10*3/uL (ref 0.7–4.0)
MCH: 33 pg (ref 26.0–34.0)
MCHC: 34.4 g/dL (ref 30.0–36.0)
MCV: 95.9 fL (ref 80.0–100.0)
Monocytes Absolute: 0.6 10*3/uL (ref 0.1–1.0)
Monocytes Relative: 11 %
Neutro Abs: 2.4 10*3/uL (ref 1.7–7.7)
Neutrophils Relative %: 45 %
Platelets: 211 10*3/uL (ref 150–400)
RBC: 4.85 MIL/uL (ref 4.22–5.81)
RDW: 11.5 % (ref 11.5–15.5)
WBC: 5.2 10*3/uL (ref 4.0–10.5)
nRBC: 0 % (ref 0.0–0.2)

## 2021-12-04 LAB — CK: Total CK: 322 U/L (ref 49–397)

## 2021-12-04 LAB — ETHANOL: Alcohol, Ethyl (B): <10 mg/dL (ref <10)

## 2021-12-04 MED ORDER — POTASSIUM CHLORIDE CRYS ER 20 MEQ PO TBCR
40.0000 meq | EXTENDED_RELEASE_TABLET | Freq: Once | ORAL | Status: AC
  Administered 2021-12-04: 40 meq via ORAL
  Filled 2021-12-04: qty 2

## 2021-12-04 MED ORDER — LACTATED RINGERS IV BOLUS
1000.0000 mL | Freq: Once | INTRAVENOUS | Status: AC
  Administered 2021-12-04: 1000 mL via INTRAVENOUS

## 2021-12-04 MED ORDER — LACTATED RINGERS IV SOLN: INTRAVENOUS | Status: DC
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
## 2021-12-04 NOTE — ED Notes (Signed)
Patient called out stating he was ready for discharge. MD made aware.
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
## 2021-12-05 ENCOUNTER — Encounter (HOSPITAL_COMMUNITY): Payer: Self-pay

## 2021-12-05 ENCOUNTER — Emergency Department (HOSPITAL_COMMUNITY)
Admission: EM | Admit: 2021-12-05 | Discharge: 2021-12-07 | Disposition: A | Discharge status: Home / Self Care | Payer: Self-pay | Attending: Emergency Medicine | Admitting: Emergency Medicine

## 2021-12-05 DIAGNOSIS — F1914 Other psychoactive substance abuse with psychoactive substance-induced mood disorder: Secondary | ICD-10-CM | POA: Insufficient documentation

## 2021-12-05 DIAGNOSIS — Y900 Blood alcohol level of less than 20 mg/100 ml: Secondary | ICD-10-CM | POA: Insufficient documentation

## 2021-12-05 DIAGNOSIS — F1994 Other psychoactive substance use, unspecified with psychoactive substance-induced mood disorder: Secondary | ICD-10-CM | POA: Diagnosis present

## 2021-12-05 DIAGNOSIS — E876 Hypokalemia: Secondary | ICD-10-CM | POA: Insufficient documentation

## 2021-12-05 DIAGNOSIS — Z79899 Other long term (current) drug therapy: Secondary | ICD-10-CM | POA: Insufficient documentation

## 2021-12-05 DIAGNOSIS — R45850 Homicidal ideations: Secondary | ICD-10-CM | POA: Insufficient documentation

## 2021-12-05 DIAGNOSIS — Z046 Encounter for general psychiatric examination, requested by authority: Secondary | ICD-10-CM | POA: Insufficient documentation

## 2021-12-05 DIAGNOSIS — R45851 Suicidal ideations: Secondary | ICD-10-CM | POA: Insufficient documentation

## 2021-12-05 LAB — COMPREHENSIVE METABOLIC PANEL
ALT: 30 U/L (ref 0–44)
AST: 35 U/L (ref 15–41)
Albumin: 4.1 g/dL (ref 3.5–5.0)
Alkaline Phosphatase: 60 U/L (ref 38–126)
Anion gap: 7 (ref 5–15)
BUN: 14 mg/dL (ref 6–20)
CO2: 30 mmol/L (ref 22–32)
Calcium: 9.7 mg/dL (ref 8.9–10.3)
Chloride: 105 mmol/L (ref 98–111)
Creatinine, Ser: 1.21 mg/dL (ref 0.61–1.24)
GFR, Estimated: >60 mL/min (ref >60)
Glucose, Bld: 69 mg/dL — ABNORMAL LOW (ref 70–99)
Potassium: 3 mmol/L — ABNORMAL LOW (ref 3.5–5.1)
Sodium: 142 mmol/L (ref 135–145)
Total Bilirubin: 1.8 mg/dL — ABNORMAL HIGH (ref 0.3–1.2)
Total Protein: 7 g/dL (ref 6.5–8.1)

## 2021-12-05 LAB — ACETAMINOPHEN LEVEL: Acetaminophen (Tylenol), Serum: <10 ug/mL — ABNORMAL LOW (ref 10–30)

## 2021-12-05 LAB — RAPID URINE DRUG SCREEN, HOSP PERFORMED
Amphetamines: POSITIVE — AB
Barbiturates: NOT DETECTED
Benzodiazepines: NOT DETECTED
Cocaine: NOT DETECTED
Opiates: NOT DETECTED
Tetrahydrocannabinol: POSITIVE — AB

## 2021-12-05 LAB — CBC
HCT: 46 % (ref 39.0–52.0)
Hemoglobin: 15.6 g/dL (ref 13.0–17.0)
MCH: 33.3 pg (ref 26.0–34.0)
MCHC: 33.9 g/dL (ref 30.0–36.0)
MCV: 98.1 fL (ref 80.0–100.0)
Platelets: 182 10*3/uL (ref 150–400)
RBC: 4.69 MIL/uL (ref 4.22–5.81)
RDW: 11.6 % (ref 11.5–15.5)
WBC: 4.8 10*3/uL (ref 4.0–10.5)
nRBC: 0 % (ref 0.0–0.2)

## 2021-12-05 LAB — ETHANOL: Alcohol, Ethyl (B): <10 mg/dL (ref <10)

## 2021-12-05 LAB — SALICYLATE LEVEL: Salicylate Lvl: <7 mg/dL — ABNORMAL LOW (ref 7.0–30.0)

## 2021-12-05 MED ORDER — POTASSIUM CHLORIDE CRYS ER 20 MEQ PO TBCR
40.0000 meq | EXTENDED_RELEASE_TABLET | Freq: Once | ORAL | Status: AC
  Administered 2021-12-05: 40 meq via ORAL
  Filled 2021-12-05: qty 2

## 2021-12-05 MED ORDER — LEVETIRACETAM 500 MG PO TABS
1250.0000 mg | ORAL_TABLET | Freq: Two times a day (BID) | ORAL | Status: DC
  Administered 2021-12-05 – 2021-12-07 (×5): 1250 mg via ORAL
  Filled 2021-12-05 (×5): qty 1

## 2021-12-05 MED ORDER — LORAZEPAM 1 MG PO TABS
1.0000 mg | ORAL_TABLET | Freq: Once | ORAL | Status: AC
  Administered 2021-12-05: 1 mg via ORAL
  Filled 2021-12-05: qty 1

## 2021-12-05 MED ORDER — ACETAMINOPHEN 500 MG PO TABS
1000.0000 mg | ORAL_TABLET | Freq: Once | ORAL | Status: AC
  Administered 2021-12-05: 1000 mg via ORAL
  Filled 2021-12-05: qty 2

## 2021-12-05 MED ORDER — BICTEGRAVIR-EMTRICITAB-TENOFOV 50-200-25 MG PO TABS
1.0000 | ORAL_TABLET | Freq: Every day | ORAL | Status: DC
  Administered 2021-12-05 – 2021-12-07 (×3): 1 via ORAL
  Filled 2021-12-05 (×3): qty 1
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
## 2021-12-05 NOTE — ED Provider Notes (Signed)
[facility]-EMERGENCY DEPT Provider Note   CSN: [id number] Arrival date & time: 12/05/21  0610     History  Chief Complaint  Patient presents with   Alcohol Problem   Drug Problem    EVERADO PILLSBURY is a 41 y.o. male.  41 year old male with recurrent emergency room visits presents today for evaluation of not feeling well, suicidal ideation, homicidal ideations.  He also reports concern for potential seizure this morning.  He suspects this because he is overall not feeling well, sore which is typical for him following a seizure.  He reports compliance with Keppra.  Last dose last evening.  Has not taken a dose this morning.  The history is provided by the patient. No language interpreter was used.      Home Medications Prior to Admission medications   Medication Sig Start Date End Date Taking? Authorizing Provider  bictegravir-emtricitabine-tenofovir AF (BIKTARVY) 50-200-25 MG TABS tablet Take 1 tablet by mouth daily. 12/02/21 01/01/22  Earney Navy, NP  citalopram (CELEXA) 20 MG tablet Take 1 tablet (20 mg total) by mouth daily. Patient not taking: Reported on 11/30/2021 08/16/21   [name], MD  levETIRAcetam (KEPPRA) 250 MG tablet Take 5 tablets (1,250 mg total) by mouth 2 (two) times daily. 12/01/21 12/31/21  Earney Navy, NP  nicotine (NICODERM CQ - DOSED IN MG/24 HOURS) 14 mg/24hr patch Place 1 patch (14 mg total) onto the skin daily. 12/01/21 12/31/21  Earney Navy, NP      Allergies    Patient has no known allergies.    Review of Systems   Review of Systems  Constitutional:  Negative for chills and fever.  Respiratory:  Negative for shortness of breath.   Cardiovascular:  Negative for chest pain.  Gastrointestinal:  Negative for abdominal pain.  Psychiatric/Behavioral:  Positive for sleep disturbance and suicidal ideas.   All other systems reviewed and are negative.  Physical Exam Updated Vital Signs BP 112/76   Pulse 73    Temp (!) 97.5 F (36.4 C)   Resp 13   SpO2 100%  Physical Exam Vitals and nursing note reviewed.  Constitutional:      General: He is not in acute distress.    Appearance: Normal appearance. He is not ill-appearing.  HENT:     Head: Normocephalic and atraumatic.     Nose: Nose normal.  Eyes:     Conjunctiva/sclera: Conjunctivae normal.  Cardiovascular:     Rate and Rhythm: Normal rate and regular rhythm.     Pulses: Normal pulses.  Pulmonary:     Effort: Pulmonary effort is normal. No respiratory distress.     Breath sounds: Normal breath sounds. No wheezing or rales.  Abdominal:     General: There is no distension.     Palpations: Abdomen is soft.     Tenderness: There is no abdominal tenderness. There is no guarding.  Musculoskeletal:        General: No deformity.  Skin:    Findings: No rash.  Neurological:     Mental Status: He is alert.  Psychiatric:        Thought Content: Thought content includes homicidal and suicidal ideation. Thought content includes suicidal plan. Thought content does not include homicidal plan.    ED Results / Procedures / Treatments   Labs (all labs ordered are listed, but only abnormal results are displayed) Labs Reviewed  CBC  ETHANOL  ACETAMINOPHEN LEVEL  SALICYLATE LEVEL  RAPID URINE DRUG SCREEN,  HOSP PERFORMED  COMPREHENSIVE METABOLIC PANEL  LEVETIRACETAM LEVEL    EKG None  Radiology No results found.  Procedures Procedures    Medications Ordered in ED Medications - No data to display  ED Course/ Medical Decision Making/ A&P                           Medical Decision Making Amount and/or Complexity of Data Reviewed Labs: ordered.  Risk Prescription drug management.   41 year old male with recurrent emergency room visits presents today for evaluation of not feeling well.  He was recently discharged yesterday.  He reports suicidal and homicidal ideations.  Also was concerned he had a potential seizure this morning.   Endorses taking Keppra without missing any doses.  Will obtain basic blood work as well as Keppra level. CBC without leukocytosis or anemia.  Salicylate level, acetaminophen level, ethanol level within normal limits.  CMP with hypokalemia of 3.0, glucose of 69 otherwise unremarkable.  Keppra level pending.  Patient has been cleared for psych eval.  EKG without acute ischemic changes. P.o. potassium provided for repletion.  Psych evaluated patient and recommend patient be monitored and reevaluated.  Patient signed out to default provider.  Final Clinical Impression(s) / ED Diagnoses Final diagnoses:  Hypokalemia  Suicidal ideation    Rx / DC Orders ED Discharge Orders     None         Marita Kansas, PA-C 12/05/21 1414    [name], DO 12/05/21 1432

## 2021-12-05 NOTE — ED Provider Notes (Signed)
Past Medical History:  Diagnosis Date  . Chronic hepatitis C without hepatic coma (HCC) 08/17/2017  . Endocarditis   . Hepatitis C   . History of syphilis 08/25/2014   Treated by [facility] 07-2014.  [name] HIV (human immunodeficiency virus infection) (HCC)   . HIV (human immunodeficiency virus infection) (HCC)   . Seizures (HCC)     Physical Exam  BP 100/75 (BP Location: Left Arm)   Pulse 76   Temp 97.7 F (36.5 C) (Oral)   Resp 15   SpO2 100%   Physical Exam  Procedures  Procedures  ED Course / MDM    Medical Decision Making Risk OTC drugs.   ***

## 2021-12-05 NOTE — ED Notes (Signed)
One patient belonging bag placed in cupboards on resus side.
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
## 2021-12-05 NOTE — ED Notes (Addendum)
Patient expressed to this nurse that he wanted to kill himself by shooting himself. He said that he does not think that life is worth living and that he has been calling people trying to get things settled for when he is dead. Patient actively sobbing at this time. Sitter order placed, provider aware.

## 2021-12-05 NOTE — ED Notes (Signed)
ED Provider at bedside. 

## 2021-12-05 NOTE — ED Notes (Signed)
Pt woke up and pulled all monitoring wires off. Swiftly walked out of room towards EMS bay. Appeared confused. Pt walked towards registration desk and laid on floor. Pt complained of his head hurting and hearing voices. Security helped pt back to bed. Pt is now calm and cooperative with staff.

## 2021-12-05 NOTE — ED Notes (Signed)
Pt sats dropped into 80s. 2L applied via nasal cannula.

## 2021-12-06 ENCOUNTER — Encounter (HOSPITAL_COMMUNITY): Payer: Self-pay | Admitting: Emergency Medicine

## 2021-12-06 MED ORDER — OLANZAPINE 5 MG PO TBDP
5.0000 mg | ORAL_TABLET | Freq: Every day | ORAL | Status: DC
  Administered 2021-12-06: 5 mg via ORAL
  Filled 2021-12-06: qty 1

## 2021-12-06 MED ORDER — OLANZAPINE 2.5 MG PO TABS
ORAL_TABLET | ORAL | Status: AC
  Administered 2021-12-06: 2.5 mg via ORAL
  Filled 2021-12-06: qty 1

## 2021-12-06 MED ORDER — CITALOPRAM HYDROBROMIDE 10 MG PO TABS
10.0000 mg | ORAL_TABLET | Freq: Every day | ORAL | Status: DC
  Administered 2021-12-07: 10 mg via ORAL
  Filled 2021-12-06: qty 1

## 2021-12-06 MED ORDER — OLANZAPINE 5 MG PO TABS: 2.5000 mg | ORAL_TABLET | Freq: Once | ORAL | Status: AC

## 2021-12-06 MED ORDER — CITALOPRAM HYDROBROMIDE 10 MG PO TABS
ORAL_TABLET | ORAL | Status: AC
  Administered 2021-12-06: 10 mg via ORAL
  Filled 2021-12-06: qty 1

## 2021-12-06 NOTE — Consult Note (Signed)
Two attempts made to speak to patient failed as he refused to speak with this provider.  Earlier in the morning he would not wake up and take his Olanzapine and other medications.

## 2021-12-06 NOTE — ED Notes (Signed)
Pt requested to speak to his nurse about him hearing voices. Pt states that the voices are getting more coherent and they are telling him to " hurt levi, someone else, and then himself." Pt asked this RN if those people were here. This RN verified with pt that those people were not present.

## 2021-12-06 NOTE — ED Provider Notes (Signed)
Emergency Medicine Observation Re-evaluation Note  Justin [name] is a 41 y.o. male, seen on rounds today.  Pt initially presented to the ED for complaints of Alcohol Problem and Drug Problem Currently, the patient is resting.  Physical Exam  BP 105/71 (BP Location: Left Arm)   Pulse 66   Temp 97.7 F (36.5 C) (Oral)   Resp 17   SpO2 100%  Physical Exam Neurological:     General: No focal deficit present.     Mental Status: He is alert.  Psychiatric:        Mood and Affect: Mood normal.     ED Course / MDM  EKG:EKG Interpretation  Date/Time:  Sunday December 05 2021 08:29:00 EDT Ventricular Rate:  71 PR Interval:  138 QRS Duration: 96 QT Interval:  405 QTC Calculation: 441 R Axis:   15 Text Interpretation: Sinus rhythm Confirmed by Ronnald Nian, Maryjo Ragon ([id number]) on 12/05/2021 8:34:49 AM  I have reviewed the labs performed to date as well as medications administered while in observation.  Recent changes in the last 24 hours include nothing.  Plan  Current plan is for waiting for psych re-eval this AM. Justin [name] is under involuntary commitment.      Lennice Sites, DO 12/06/21 [phone]

## 2021-12-06 NOTE — ED Notes (Signed)
Pt currently sleeping, will get VS when he is awake

## 2021-12-07 ENCOUNTER — Other Ambulatory Visit: Payer: Self-pay

## 2021-12-07 ENCOUNTER — Emergency Department (HOSPITAL_COMMUNITY)
Admission: EM | Admit: 2021-12-07 | Discharge: 2021-12-08 | Disposition: A | Discharge status: Home / Self Care | Payer: Self-pay | Attending: Emergency Medicine | Admitting: Emergency Medicine

## 2021-12-07 ENCOUNTER — Encounter (HOSPITAL_COMMUNITY): Payer: Self-pay | Admitting: Emergency Medicine

## 2021-12-07 DIAGNOSIS — F39 Unspecified mood [affective] disorder: Secondary | ICD-10-CM | POA: Insufficient documentation

## 2021-12-07 DIAGNOSIS — R45851 Suicidal ideations: Secondary | ICD-10-CM | POA: Insufficient documentation

## 2021-12-07 DIAGNOSIS — B20 Human immunodeficiency virus [HIV] disease: Secondary | ICD-10-CM

## 2021-12-07 MED ORDER — OLANZAPINE 5 MG PO TBDP: 5.0000 mg | ORAL_TABLET | Freq: Every day | ORAL | 0 refills | Status: DC

## 2021-12-07 MED ORDER — LEVETIRACETAM 250 MG PO TABS: 1250.0000 mg | ORAL_TABLET | Freq: Two times a day (BID) | ORAL | 0 refills | Status: DC

## 2021-12-07 MED ORDER — BIKTARVY 50-200-25 MG PO TABS
1.0000 | ORAL_TABLET | Freq: Every day | ORAL | 0 refills | Status: AC
Start: 2021-12-08 — End: 2022-01-07

## 2021-12-07 MED ORDER — CITALOPRAM HYDROBROMIDE 10 MG PO TABS: 10.0000 mg | ORAL_TABLET | Freq: Every day | ORAL | 0 refills | Status: DC
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
## 2021-12-07 NOTE — ED Notes (Signed)
100% breakfast ate, toothbrush and deodorant provided

## 2021-12-07 NOTE — ED Provider Triage Note (Signed)
Emergency Medicine Provider Triage Evaluation Note  Justin [name] , a 41 y.o. male  was evaluated in triage.  Pt complains of suicidal ideations.  Patient was recently discharged from [name].   Review of Systems  Positive: As above Negative: As above  Physical Exam  BP 122/80 (BP Location: Right Arm)   Pulse 88   Temp 98.4 F (36.9 C) (Oral)   Resp 16   SpO2 99%  Gen:   Awake, no distress   Resp:  Normal effort  MSK:   Moves extremities without difficulty  Other:    Medical Decision Making  Medically screening exam initiated at 11:50 PM.  Appropriate orders placed.  Justin [name] was informed that the remainder of the evaluation will be completed by another provider, this initial triage assessment does not replace that evaluation, and the importance of remaining in the ED until their evaluation is complete.     Justin [place], PA-C 12/07/21 2352

## 2021-12-07 NOTE — ED Notes (Signed)
2 belonging bags of clothes and bookbag returned

## 2021-12-07 NOTE — Progress Notes (Signed)
Substance abuse resource are attached to pt's AVS TOC sign off.   Valentina Shaggy.Laken Lobato, MSW, LCSWA [facility]  Transitions of Care Clinical Social Worker I Direct Dial: [phone]  Fax: [phone] [email or web address]

## 2021-12-07 NOTE — ED Notes (Signed)
IVC resending paper finished by Dr Durwin Nora

## 2021-12-07 NOTE — ED Notes (Signed)
Security called for discharge patient screaming and saying "yalls day is about to be messed up real bad"

## 2021-12-07 NOTE — ED Notes (Signed)
Discharge paperwork explained with verbal understanding

## 2021-12-07 NOTE — ED Notes (Signed)
Breakfast tray given. °

## 2021-12-07 NOTE — ED Provider Notes (Signed)
Emergency Medicine Observation Re-evaluation Note  Justin [name] is a 41 y.o. male, seen on rounds today.  Pt initially presented to the ED for complaints of Alcohol Problem and Drug Problem Currently, the patient is sleeping.  Physical Exam  BP (!) 95/55 (BP Location: Right Arm)   Pulse 77   Temp (!) 97.5 F (36.4 C) (Oral)   Resp 18   SpO2 96%  Physical Exam General: Sleeping Cardiac: Extremities perfused Lungs: Breathing unlabored Psych: Deferred  ED Course / MDM  EKG:EKG Interpretation  Date/Time:  Sunday December 05 2021 08:29:00 EDT Ventricular Rate:  71 PR Interval:  138 QRS Duration: 96 QT Interval:  405 QTC Calculation: 441 R Axis:   15 Text Interpretation: Sinus rhythm Confirmed by Lockie Mola, Adam ([id number]) on 12/05/2021 8:34:49 AM  I have reviewed the labs performed to date as well as medications administered while in observation.  Recent changes in the last 24 hours include none.  Plan  Current plan is for continued observation and TTS reevaluation. Justin [name] is under involuntary commitment.      Justin Manchester, MD 12/07/21 [phone]

## 2021-12-07 NOTE — Discharge Summary (Signed)
[facility] ED Discharge  12/07/2021 10:59 AM Justin [name]  MRN:  [id number]  Principal Problem: <principal problem not specified> Discharge Diagnoses: Active Problems:   Substance induced mood disorder (HCC)  Clinical Impression:  Final diagnoses:  Hypokalemia  Suicidal ideation   Subjective: AA male presented 2 days ago to the ER for HI/ SI and AH. This is one of the Numerous ER visit for this patient who was discharged from the ER. Last week as well for same complaint.  He admitted to using Methamphetamine and Marijuana.  He also stated he want to stop using and want long term substance abuse treatment.  He has been been given resources for treatment facilities and shared this morning that because he is homeless he lost all the resource papers given to him.  He is also being treated for Depression and anxiety and not compliant with his medications.  Patient is homeless and have no family support he says.  He is interested in getting treatment for substance use.  He denies SI/HI/AVH and he will be discharged on low dose Olanzapine for AVH  and Citalopram for Depression and anxiety.  SW is to offer him resources again for outpatient housing/treatment facilities.  Patient is discharged.  ED Assessment Time Calculation: Start Time: 1021 Stop Time: 1045 Total Time in Minutes (Assessment Completion): 24   Past Psychiatric History: Anxiety, depression, Polysubstance abuse.Amphetamine psychotic induced mood disorder.  Several ER visits.  Past Medical History:  Past Medical History:  Diagnosis Date   Chronic hepatitis C without hepatic coma ([place]) 08/17/2017   Endocarditis    Hepatitis C    History of syphilis 08/25/2014   Treated by [facility] 07-2014.   HIV (human immunodeficiency virus infection) ([place])    HIV (human immunodeficiency virus infection) ([place])    Seizures ([place])     Past Surgical History:  Procedure Laterality Date   hand surgery Right    LUMBAR PUNCTURE  08/08/2019       WRIST  SURGERY     Family History:  Family History  Adopted: Yes  Problem Relation Age of Onset   Diabetes type II Mother    Family Psychiatric  History: unknown Social History:  Social History   Substance and Sexual Activity  Alcohol Use Not Currently   Comment: in rehab     Social History   Substance and Sexual Activity  Drug Use Not Currently   Types: Marijuana, Methamphetamines   Comment: in rehab    Social History   Socioeconomic History   Marital status: Single    Spouse name: Not on file   Number of children: Not on file   Years of education: Not on file   Highest education level: Not on file  Occupational History   Not on file  Tobacco Use   Smoking status: Former    Types: Cigarettes   Smokeless tobacco: Never  Vaping Use   Vaping Use: Never used  Substance and Sexual Activity   Alcohol use: Not Currently    Comment: in rehab   Drug use: Not Currently    Types: Marijuana, Methamphetamines    Comment: in rehab   Sexual activity: Yes    Partners: Male    Birth control/protection: Condom    Comment: given condoms  Other Topics Concern   Not on file  Social History Narrative   ** Merged History Encounter **       ** Merged History Encounter **       ** Merged  History Encounter **       Social Determinants of Health   Financial Resource Strain: Not on file  Food Insecurity: Not on file  Transportation Needs: Not on file  Physical Activity: Not on file  Stress: Not on file  Social Connections: Not on file    Tobacco Cessation:  N/A, patient does not currently use tobacco products  Current Medications: Current Facility-Administered Medications  Medication Dose Route Frequency Provider Last Rate Last Admin   bictegravir-emtricitabine-tenofovir AF (BIKTARVY) 50-200-25 MG per tablet 1 tablet  1 tablet Oral Daily Leevy-Johnson, Brooke A, NP   1 tablet at 12/07/21 1046   citalopram (CELEXA) tablet 10 mg  10 mg Oral Daily Arrow Emmerich C, NP   10 mg  at 12/07/21 1046   levETIRAcetam (KEPPRA) tablet 1,250 mg  1,250 mg Oral BID Leevy-Johnson, Brooke A, NP   1,250 mg at 12/07/21 0732   OLANZapine zydis (ZYPREXA) disintegrating tablet 5 mg  5 mg Oral QHS Remie Mathison C, NP   5 mg at 12/06/21 2117   Current Outpatient Medications  Medication Sig Dispense Refill   [START ON 12/08/2021] bictegravir-emtricitabine-tenofovir AF (BIKTARVY) 50-200-25 MG TABS tablet Take 1 tablet by mouth daily. 30 tablet 0   [START ON 12/08/2021] citalopram (CELEXA) 10 MG tablet Take 1 tablet (10 mg total) by mouth daily. 30 tablet 0   levETIRAcetam (KEPPRA) 250 MG tablet Take 5 tablets (1,250 mg total) by mouth 2 (two) times daily. 300 tablet 0   OLANZapine zydis (ZYPREXA) 5 MG disintegrating tablet Take 1 tablet (5 mg total) by mouth at bedtime. 30 tablet 0   PTA Medications: (Not in a hospital admission)   [place] Scale:  [facility] ED from 12/05/2021 in [place] [facility]PT ED from 12/01/2021 in [place] [facility] ED from 11/30/2021 in [facility] DEPT  C-SSRS RISK CATEGORY No Risk No Risk No Risk       Musculoskeletal: Strength & Muscle Tone: within normal limits Gait & Station: normal Patient leans: Front  Psychiatric Specialty Exam: Presentation  General Appearance: Appropriate for Environment; Casual  Eye Contact:Fair  Speech:Clear and Coherent; Normal Rate  Speech Volume:Normal  Handedness:Right   Mood and Affect  Mood:Depressed  Affect:Congruent   Thought Process  Thought Processes:Coherent; Goal Directed; Linear  Descriptions of Associations:Intact  Orientation:Full (Time, Place and Person)  Thought Content:Logical  History of Schizophrenia/Schizoaffective disorder:No  Duration of Psychotic Symptoms:N/A  Hallucinations:Hallucinations: None  Ideas of Reference:None  Suicidal Thoughts:Suicidal Thoughts: No  Homicidal Thoughts:Homicidal  Thoughts: No   Sensorium  Memory:Immediate Good; Recent Good; Remote Good  Judgment:Good  Insight:Good   Executive Functions  Concentration:Good  Attention Span:Good  [place] of Knowledge:Good  Language:Good   Psychomotor Activity  Psychomotor Activity:Psychomotor Activity: Normal   Assets  Assets:Communication Skills; Desire for Improvement   Sleep  Sleep:Sleep: Good    Physical Exam: Physical Exam ROS Blood pressure (!) 120/97, pulse 72, temperature 98 F (36.7 C), resp. rate 18, SpO2 97 %. There is no height or weight on file to calculate BMI.   Demographic Factors:  Male, Adolescent or young adult, Low socioeconomic status, Unemployed, and homeless  Loss Factors: Financial problems/change in socioeconomic status  Historical Factors: NA  Risk Reduction Factors:   Positive coping skills or problem solving skills  Continued Clinical Symptoms:  Depression:   Hopelessness Alcohol/Substance Abuse/Dependencies  Cognitive Features That Contribute To Risk:  None    Suicide Risk:  Minimal: No identifiable suicidal ideation.  Patients presenting with no risk factors but with morbid ruminations; may be classified as minimal risk based on the severity of the depressive symptoms   Follow-up [facility] .   Specialty: Urgent Care Why: As needed Contact information: [place] [phone]                Plan Of Care/Follow-up recommendations:  Activity:  as tolerated Diet:  Regular  Medical Decision Making: Patient always hears voices after using Methamphetamine and Cannabis.  He want to stop using substances and get back on his life.  He also understands that using illicit substance hinders his progress.  His homelessness also does not help.  SW consult is in so resources for treatment and housing will be offered to him.  Patient is in agreement with  this plan.  Places like [facility] will be useful.     Problem 1: Substance induced mood disorder  Problem 2: MDD, Recurrent  Problem 3: Anxiety disorder  Disposition: Discharge  Delfin Gant, NP-PMHNP-BC 12/07/2021, 10:59 AM

## 2021-12-07 NOTE — ED Notes (Signed)
Pt reports having no shoes, shoes provided.

## 2021-12-07 NOTE — ED Notes (Signed)
Pt ambulatory from room 27 to 39.

## 2021-12-07 NOTE — ED Triage Notes (Signed)
Patient reports suicidal ideation plans to shoot himself with auditory hallucinations .

## 2021-12-08 LAB — CBC WITH DIFFERENTIAL/PLATELET
Abs Immature Granulocytes: 0.01 10*3/uL (ref 0.00–0.07)
Basophils Absolute: 0 10*3/uL (ref 0.0–0.1)
Basophils Relative: 0 %
Eosinophils Absolute: 0.1 10*3/uL (ref 0.0–0.5)
Eosinophils Relative: 1 %
HCT: 52.6 % — ABNORMAL HIGH (ref 39.0–52.0)
Hemoglobin: 17.5 g/dL — ABNORMAL HIGH (ref 13.0–17.0)
Immature Granulocytes: 0 %
Lymphocytes Relative: 42 %
Lymphs Abs: 2.6 10*3/uL (ref 0.7–4.0)
MCH: 33.3 pg (ref 26.0–34.0)
MCHC: 33.3 g/dL (ref 30.0–36.0)
MCV: 100.2 fL — ABNORMAL HIGH (ref 80.0–100.0)
Monocytes Absolute: 0.7 10*3/uL (ref 0.1–1.0)
Monocytes Relative: 11 %
Neutro Abs: 2.8 10*3/uL (ref 1.7–7.7)
Neutrophils Relative %: 46 %
Platelets: 218 10*3/uL (ref 150–400)
RBC: 5.25 MIL/uL (ref 4.22–5.81)
RDW: 11.4 % — ABNORMAL LOW (ref 11.5–15.5)
WBC: 6.2 10*3/uL (ref 4.0–10.5)
nRBC: 0 % (ref 0.0–0.2)

## 2021-12-08 LAB — COMPREHENSIVE METABOLIC PANEL
ALT: 25 U/L (ref 0–44)
AST: 26 U/L (ref 15–41)
Albumin: 4.1 g/dL (ref 3.5–5.0)
Alkaline Phosphatase: 70 U/L (ref 38–126)
Anion gap: 9 (ref 5–15)
BUN: 13 mg/dL (ref 6–20)
CO2: 29 mmol/L (ref 22–32)
Calcium: 10 mg/dL (ref 8.9–10.3)
Chloride: 106 mmol/L (ref 98–111)
Creatinine, Ser: 1.46 mg/dL — ABNORMAL HIGH (ref 0.61–1.24)
GFR, Estimated: >60 mL/min (ref >60)
Glucose, Bld: 72 mg/dL (ref 70–99)
Potassium: 5.3 mmol/L — ABNORMAL HIGH (ref 3.5–5.1)
Sodium: 144 mmol/L (ref 135–145)
Total Bilirubin: 1.1 mg/dL (ref 0.3–1.2)
Total Protein: 6.9 g/dL (ref 6.5–8.1)

## 2021-12-08 LAB — T-HELPER CELL (CD4) - (RCID CLINIC ONLY)
CD4 % Helper T Cell: 30 % — ABNORMAL LOW (ref 33–65)
CD4 T Cell Abs: 514 /uL (ref 400–1790)

## 2021-12-08 LAB — URINE CYTOLOGY ANCILLARY ONLY
Chlamydia: NEGATIVE
Comment: NEGATIVE
Comment: NORMAL
Neisseria Gonorrhea: NEGATIVE

## 2021-12-08 LAB — ETHANOL: Alcohol, Ethyl (B): <10 mg/dL (ref <10)

## 2021-12-08 LAB — ACETAMINOPHEN LEVEL: Acetaminophen (Tylenol), Serum: <10 ug/mL — ABNORMAL LOW (ref 10–30)

## 2021-12-08 LAB — SALICYLATE LEVEL: Salicylate Lvl: <7 mg/dL — ABNORMAL LOW (ref 7.0–30.0)

## 2021-12-08 NOTE — ED Provider Notes (Signed)
[name] EMERGENCY DEPARTMENT Provider Note   CSN: [id number] Arrival date & time: 12/07/21  2249     History  Chief Complaint  Patient presents with   Suicidal    Justin [name] is a 41 y.o. male.  The history is provided by the patient.  Justin [name] is a 41 y.o. male who presents to the Emergency Department complaining of suicidal.  Patient brought himself voluntarily to the emergency department for evaluation of SI.  Patient refuses to talk to this provider.  On record review he was discharged yesterday from [facility] for substance-induced mood disorder.    Home Medications Prior to Admission medications   Medication Sig Start Date End Date Taking? Authorizing Provider  bictegravir-emtricitabine-tenofovir AF (BIKTARVY) 50-200-25 MG TABS tablet Take 1 tablet by mouth daily. 12/08/21 01/07/22  Earney Navy, NP  citalopram (CELEXA) 10 MG tablet Take 1 tablet (10 mg total) by mouth daily. 12/08/21 01/07/22  Earney Navy, NP  levETIRAcetam (KEPPRA) 250 MG tablet Take 5 tablets (1,250 mg total) by mouth 2 (two) times daily. 12/07/21 01/06/22  Earney Navy, NP  OLANZapine zydis (ZYPREXA) 5 MG disintegrating tablet Take 1 tablet (5 mg total) by mouth at bedtime. 12/07/21 01/06/22  Earney Navy, NP      Allergies    Patient has no known allergies.    Review of Systems   Review of Systems  Unable to perform ROS: Other   Physical Exam Updated Vital Signs BP 122/80 (BP Location: Right Arm)   Pulse 88   Temp 98.4 F (36.9 C) (Oral)   Resp 16   SpO2 99%  Physical Exam Vitals and nursing note reviewed.  Constitutional:      Appearance: He is well-developed.  HENT:     Head: Normocephalic and atraumatic.  Cardiovascular:     Rate and Rhythm: Normal rate and regular rhythm.  Pulmonary:     Effort: Pulmonary effort is normal.     Breath sounds: Normal breath sounds.  Musculoskeletal:        General: No tenderness.  Skin:     General: Skin is warm and dry.  Neurological:     Mental Status: He is alert.     Comments: Normal gait.  Moves all extremities symmetrically  Psychiatric:     Comments: Calm.    ED Results / Procedures / Treatments   Labs (all labs ordered are listed, but only abnormal results are displayed) Labs Reviewed  CBC WITH DIFFERENTIAL/PLATELET - Abnormal; Notable for the following components:      Result Value   Hemoglobin 17.5 (*)    HCT 52.6 (*)    MCV 100.2 (*)    RDW 11.4 (*)    All other components within normal limits  COMPREHENSIVE METABOLIC PANEL - Abnormal; Notable for the following components:   Potassium 5.3 (*)    Creatinine, Ser 1.46 (*)    All other components within normal limits  SALICYLATE LEVEL - Abnormal; Notable for the following components:   Salicylate Lvl <7.0 (*)    All other components within normal limits  ACETAMINOPHEN LEVEL - Abnormal; Notable for the following components:   Acetaminophen (Tylenol), Serum <10 (*)    All other components within normal limits  ETHANOL  RAPID URINE DRUG SCREEN, HOSP PERFORMED    EKG None  Radiology No results found.  Procedures Procedures    Medications Ordered in ED Medications - No data to display  ED Course/ Medical  Decision Making/ A&P                           Medical Decision Making  Patient brought came to the emergency department voluntarily for suicidal ideation.  At time of assessment patient refuses to discuss anything with this provider.  He states to read the chart.  When discussed with patient that there is not enough information regarding today's visit he continues to refuse to discuss his reasons for being here.  On record review he has had multiple emergency department visits including being seen on June 3 and June 4 and discharged from [facility] emergency department June 6 after being evaluated by psychiatry.  Given patient is refusing to participate in evaluation will discharge with  outpatient resources.        Final Clinical Impression(s) / ED Diagnoses Final diagnoses:  Mood disorder [facility])    Rx / DC Orders ED Discharge Orders     None         Tilden Fossa, MD 12/08/21 [id number]

## 2021-12-10 ENCOUNTER — Other Ambulatory Visit: Payer: Self-pay

## 2021-12-10 ENCOUNTER — Emergency Department (HOSPITAL_COMMUNITY): Payer: Self-pay

## 2021-12-10 ENCOUNTER — Encounter (HOSPITAL_COMMUNITY): Payer: Self-pay

## 2021-12-10 ENCOUNTER — Emergency Department (HOSPITAL_COMMUNITY)
Admission: EM | Admit: 2021-12-10 | Discharge: 2021-12-10 | Disposition: A | Discharge status: Home / Self Care | Payer: No Payment, Other | Attending: Emergency Medicine | Admitting: Emergency Medicine

## 2021-12-10 ENCOUNTER — Other Ambulatory Visit (HOSPITAL_COMMUNITY): Payer: Self-pay

## 2021-12-10 DIAGNOSIS — F4329 Adjustment disorder with other symptoms: Secondary | ICD-10-CM | POA: Diagnosis present

## 2021-12-10 DIAGNOSIS — R569 Unspecified convulsions: Secondary | ICD-10-CM | POA: Insufficient documentation

## 2021-12-10 DIAGNOSIS — F1994 Other psychoactive substance use, unspecified with psychoactive substance-induced mood disorder: Secondary | ICD-10-CM

## 2021-12-10 DIAGNOSIS — R45851 Suicidal ideations: Secondary | ICD-10-CM | POA: Insufficient documentation

## 2021-12-10 DIAGNOSIS — G40909 Epilepsy, unspecified, not intractable, without status epilepticus: Secondary | ICD-10-CM

## 2021-12-10 DIAGNOSIS — F1914 Other psychoactive substance abuse with psychoactive substance-induced mood disorder: Secondary | ICD-10-CM | POA: Insufficient documentation

## 2021-12-10 DIAGNOSIS — F4323 Adjustment disorder with mixed anxiety and depressed mood: Secondary | ICD-10-CM | POA: Insufficient documentation

## 2021-12-10 DIAGNOSIS — Z79899 Other long term (current) drug therapy: Secondary | ICD-10-CM | POA: Insufficient documentation

## 2021-12-10 LAB — CBC WITH DIFFERENTIAL/PLATELET
Abs Immature Granulocytes: 0.01 10*3/uL (ref 0.00–0.07)
Basophils Absolute: 0 10*3/uL (ref 0.0–0.1)
Basophils Relative: 1 %
Eosinophils Absolute: 0.1 10*3/uL (ref 0.0–0.5)
Eosinophils Relative: 2 %
HCT: 50 % (ref 39.0–52.0)
Hemoglobin: 16.4 g/dL (ref 13.0–17.0)
Immature Granulocytes: 0 %
Lymphocytes Relative: 39 %
Lymphs Abs: 2.1 10*3/uL (ref 0.7–4.0)
MCH: 33 pg (ref 26.0–34.0)
MCHC: 32.8 g/dL (ref 30.0–36.0)
MCV: 100.6 fL — ABNORMAL HIGH (ref 80.0–100.0)
Monocytes Absolute: 0.6 10*3/uL (ref 0.1–1.0)
Monocytes Relative: 11 %
Neutro Abs: 2.6 10*3/uL (ref 1.7–7.7)
Neutrophils Relative %: 47 %
Platelets: 233 10*3/uL (ref 150–400)
RBC: 4.97 MIL/uL (ref 4.22–5.81)
RDW: 11.6 % (ref 11.5–15.5)
WBC: 5.4 10*3/uL (ref 4.0–10.5)
nRBC: 0 % (ref 0.0–0.2)

## 2021-12-10 LAB — BASIC METABOLIC PANEL
Anion gap: 8 (ref 5–15)
BUN: 13 mg/dL (ref 6–20)
CO2: 27 mmol/L (ref 22–32)
Calcium: 9.3 mg/dL (ref 8.9–10.3)
Chloride: 106 mmol/L (ref 98–111)
Creatinine, Ser: 1.04 mg/dL (ref 0.61–1.24)
GFR, Estimated: >60 mL/min (ref >60)
Glucose, Bld: 92 mg/dL (ref 70–99)
Potassium: 3.8 mmol/L (ref 3.5–5.1)
Sodium: 141 mmol/L (ref 135–145)

## 2021-12-10 LAB — ETHANOL: Alcohol, Ethyl (B): <10 mg/dL (ref <10)

## 2021-12-10 MED ORDER — BICTEGRAVIR-EMTRICITAB-TENOFOV 50-200-25 MG PO TABS
1.0000 | ORAL_TABLET | Freq: Every day | ORAL | Status: DC
  Administered 2021-12-10: 1 via ORAL
  Filled 2021-12-10: qty 1

## 2021-12-10 MED ORDER — LEVETIRACETAM 250 MG PO TABS
1250.0000 mg | ORAL_TABLET | Freq: Two times a day (BID) | ORAL | 0 refills | Status: DC
  Filled 2021-12-10: qty 300, 30d supply, fill #0

## 2021-12-10 MED ORDER — OLANZAPINE 10 MG PO TBDP
10.0000 mg | ORAL_TABLET | Freq: Every day | ORAL | Status: DC
  Administered 2021-12-10: 10 mg via ORAL
  Filled 2021-12-10: qty 1

## 2021-12-10 MED ORDER — LEVETIRACETAM IN NACL 1000 MG/100ML IV SOLN
1000.0000 mg | Freq: Once | INTRAVENOUS | Status: AC
Start: 2021-12-10 — End: 2021-12-10
  Administered 2021-12-10: 1000 mg via INTRAVENOUS
  Filled 2021-12-10: qty 100

## 2021-12-10 NOTE — Consult Note (Signed)
Telepsych Consultation   Reason for Consult:  si Referring Physician:  Stevie Kern, MD Location of Patient:  [facility] Location of Provider: Behavioral Health TTS Department  Patient Identification: Justin [name] MRN:  [id number] Principal Diagnosis: Adjustment disorder with mixed emotional features Diagnosis:  Principal Problem:   Adjustment disorder with mixed emotional features Active Problems:   Substance induced mood disorder (HCC)   Seizure disorder (HCC)   Total Time spent with patient: 20 minutes  Subjective:   Justin [name] is a 41 y.o. male patient admitted with seizure.  "I've going through some issues lately and I'm trying to get some help. The only place I seem to get help is this hospital. I've been having seizures and hearing voices. I just need a few days as a launching pad to get myself together and then I can leave".  Patient described having increased stress related to psychosocial factors including family conflict and homelessness. Admits substance use; says last alcohol, methamphetamine "ice" use was x3 days ago. Endorses daily alcohol use; says he's "not getting plastered". Minimal insight to effects of substance abuse on his mental health and family relationships. Provider discussed [facility] services for outpatient services; pt verbal some interest, states he "doesn't know where the facility is" despite multiple encounters at facility. Appears to be inconsistent historian. He endorses chronic depression and suicidal ideations, no intent or plan noted. Denies any homicidal ideations at this time and does not appear to be actively psychotic or responding to any external/internal stimuli at this time. Patient continuously monitored in ED overnight with no aggressive or self harming behaviors noted, received IV Keppra for seizure; EDP Dykstra has medically cleared patient. Based on chart review, patient history, and current presentation, secondary gain cannot be ruled out  for this patient. Disposition consulted to assist patient with shelter resources including [facility], [facility].   HPI:  Justin [name] is a 41 year old male with past history of HIV, polysubstance abuse, substance induced disorder, syphilis, seizures who presented to [facility] 12/10/21 for seizure. Per chart review patient has ED encounters 11/22/21, 11/30/20, 12/01/21 (x3), 12/04/21, 12/05/21, 12/07/21 throughout the [facility] system with similar presentation where he was discharged with outpatient resources and no follow-up. UDS outstanding; last UDS 12/05/21+ amphetamines, THC; BAL<10.   Past Psychiatric History: polysubstance use disorder, substance induced disorder, Anxiety,   Risk to Self:   Risk to Others:   Prior Inpatient Therapy:   Prior Outpatient Therapy:    Past Medical History:  Past Medical History:  Diagnosis Date   Chronic hepatitis C without hepatic coma (HCC) 08/17/2017   Endocarditis    Hepatitis C    History of syphilis 08/25/2014   Treated by [facility] 07-2014.   HIV (human immunodeficiency virus infection) (HCC)    HIV (human immunodeficiency virus infection) (HCC)    Seizures (HCC)     Past Surgical History:  Procedure Laterality Date   hand surgery Right    LUMBAR PUNCTURE  08/08/2019       WRIST SURGERY     Family History:  Family History  Adopted: Yes  Problem Relation Age of Onset   Diabetes type II Mother    Family Psychiatric  History: not ntoed Social History:  Social History   Substance and Sexual Activity  Alcohol Use Not Currently   Comment: in rehab     Social History   Substance and Sexual Activity  Drug Use Not Currently   Types: Marijuana, Methamphetamines   Comment: in rehab  Social History   Socioeconomic History   Marital status: Single    Spouse name: Not on file   Number of children: Not on file   Years of education: Not on file   Highest education level: Not on file  Occupational History   Not on file  Tobacco Use   Smoking status:  Former    Types: Cigarettes   Smokeless tobacco: Never  Vaping Use   Vaping Use: Never used  Substance and Sexual Activity   Alcohol use: Not Currently    Comment: in rehab   Drug use: Not Currently    Types: Marijuana, Methamphetamines    Comment: in rehab   Sexual activity: Yes    Partners: Male    Birth control/protection: Condom    Comment: given condoms  Other Topics Concern   Not on file  Social History Narrative   ** Merged History Encounter **       ** Merged History Encounter **       ** Merged History Encounter **       Social Determinants of Corporate investment bankerHealth   Financial Resource Strain: Not on file  Food Insecurity: Not on file  Transportation Needs: Not on file  Physical Activity: Not on file  Stress: Not on file  Social Connections: Not on file   Additional Social History:    Allergies:  No Known Allergies  Labs:  Results for orders placed or performed during the hospital encounter of 12/10/21 (from the past 48 hour(s))  CBC with Differential     Status: Abnormal   Collection Time: 12/10/21  6:22 AM  Result Value Ref Range   WBC 5.4 4.0 - 10.5 K/uL   RBC 4.97 4.22 - 5.81 MIL/uL   Hemoglobin 16.4 13.0 - 17.0 g/dL   HCT 40.950.0 81.139.0 - 91.452.0 %   MCV 100.6 (H) 80.0 - 100.0 fL   MCH 33.0 26.0 - 34.0 pg   MCHC 32.8 30.0 - 36.0 g/dL   RDW 78.211.6 95.611.5 - 21.315.5 %   Platelets 233 150 - 400 K/uL   nRBC 0.0 0.0 - 0.2 %   Neutrophils Relative % 47 %   Neutro Abs 2.6 1.7 - 7.7 K/uL   Lymphocytes Relative 39 %   Lymphs Abs 2.1 0.7 - 4.0 K/uL   Monocytes Relative 11 %   Monocytes Absolute 0.6 0.1 - 1.0 K/uL   Eosinophils Relative 2 %   Eosinophils Absolute 0.1 0.0 - 0.5 K/uL   Basophils Relative 1 %   Basophils Absolute 0.0 0.0 - 0.1 K/uL   Immature Granulocytes 0 %   Abs Immature Granulocytes 0.01 0.00 - 0.07 K/uL    Comment: Performed at [facility], [address] [place], [place] [address]  Basic metabolic panel     Status: None   Collection  Time: 12/10/21  6:22 AM  Result Value Ref Range   Sodium 141 135 - 145 mmol/L   Potassium 3.8 3.5 - 5.1 mmol/L   Chloride 106 98 - 111 mmol/L   CO2 27 22 - 32 mmol/L   Glucose, Bld 92 70 - 99 mg/dL    Comment: Glucose reference range applies only to samples taken after fasting for at least 8 hours.   BUN 13 6 - 20 mg/dL   Creatinine, Ser 8.461.04 0.61 - 1.24 mg/dL   Calcium 9.3 8.9 - 96.210.3 mg/dL   GFR, Estimated >95>60 >28>60 mL/min    Comment: (NOTE) Calculated using the CKD-EPI Creatinine Equation (2021)  Anion gap 8 5 - 15    Comment: Performed at [facility], [address] [place], [place] [address]  Ethanol     Status: None   Collection Time: 12/10/21  6:22 AM  Result Value Ref Range   Alcohol, Ethyl (B) <10 <10 mg/dL    Comment: (NOTE) Lowest detectable limit for serum alcohol is 10 mg/dL.  For medical purposes only. Performed at [facility], [address], [place], [place] [address]     Medications:  Current Facility-Administered Medications  Medication Dose Route Frequency Provider Last Rate Last Admin   bictegravir-emtricitabine-tenofovir AF (BIKTARVY) 50-200-25 MG per tablet 1 tablet  1 tablet Oral Daily Leevy-Johnson, Miyah Hampshire A, NP       OLANZapine zydis (ZYPREXA) disintegrating tablet 10 mg  10 mg Oral Daily Leevy-Johnson, Milind Raether A, NP       Current Outpatient Medications  Medication Sig Dispense Refill   bictegravir-emtricitabine-tenofovir AF (BIKTARVY) 50-200-25 MG TABS tablet Take 1 tablet by mouth daily. 30 tablet 0   citalopram (CELEXA) 10 MG tablet Take 1 tablet (10 mg total) by mouth daily. 30 tablet 0   levETIRAcetam (KEPPRA) 250 MG tablet Take 5 tablets (1,250 mg total) by mouth 2 (two) times daily. 300 tablet 0   OLANZapine zydis (ZYPREXA) 5 MG disintegrating tablet Take 1 tablet (5 mg total) by mouth at bedtime. 30 tablet 0    Musculoskeletal: Strength & Muscle Tone: within normal limits Gait & Station:  normal Patient leans: N/A  Psychiatric Specialty Exam:  Presentation  General Appearance: Casual  Eye Contact:Fair  Speech:Clear and Coherent; Normal Rate  Speech Volume:Normal  Handedness:Right   Mood and Affect  Mood:Depressed  Affect:Congruent   Thought Process  Thought Processes:Goal Directed  Descriptions of Associations:Intact  Orientation:Full (Time, Place and Person)  Thought Content:Logical  History of Schizophrenia/Schizoaffective disorder:No  Duration of Psychotic Symptoms:N/A  Hallucinations:Hallucinations: Other (comment) (vague; when intoxicated)  Ideas of Reference:None  Suicidal Thoughts:Suicidal Thoughts: Yes, Passive SI Passive Intent and/or Plan: Without Intent; Without Plan (chronic)  Homicidal Thoughts:Homicidal Thoughts: No   Sensorium  Memory:Immediate Fair; Recent Fair; Remote Fair  Judgment:Fair  Insight:Shallow   Executive Functions  Concentration:Fair  Attention Span:Fair  Recall:Fair  Fund of Knowledge:Fair  Language:Fair   Psychomotor Activity  Psychomotor Activity:Psychomotor Activity: Normal   Assets  Assets:Communication Skills; Physical Health   Sleep  Sleep:Sleep: Good    Physical Exam: Physical Exam Vitals and nursing note reviewed.  Constitutional:      General: He is not in acute distress.    Appearance: He is normal weight. He is not ill-appearing or toxic-appearing.  HENT:     Head: Normocephalic.     Nose: Nose normal.     Mouth/Throat:     Mouth: Mucous membranes are moist.     Pharynx: Oropharynx is clear.  Eyes:     Pupils: Pupils are equal, round, and reactive to light.  Cardiovascular:     Rate and Rhythm: Normal rate.     Pulses: Normal pulses.  Pulmonary:     Effort: Pulmonary effort is normal.  Abdominal:     General: Abdomen is flat.  Musculoskeletal:        General: Normal range of motion.     Cervical back: Normal range of motion.  Skin:    General: Skin is warm  and dry.  Neurological:     Mental Status: He is alert and oriented to person, place, and time. Mental status is at baseline.  Psychiatric:        Attention and Perception: Attention and perception normal.        Mood and Affect: Affect is blunt.        Speech: Speech normal.        Behavior: Behavior is cooperative.        Thought Content: Thought content is not paranoid or delusional. Thought content does not include homicidal or suicidal plan.        Cognition and Memory: Cognition and memory normal.    Review of Systems  Psychiatric/Behavioral:  Positive for depression and substance abuse.   All other systems reviewed and are negative.  Blood pressure 97/69, pulse 63, resp. rate 13, height 5' 11.5" (1.816 m), weight 90.7 kg, SpO2 100 %. Body mass index is 27.5 kg/m.  Treatment Plan Summary: Plan Discharge patient; SW consulted to assist patient with appropriate resources for individual follow up.   Disposition: Patient does not meet criteria for psychiatric inpatient admission. Supportive therapy provided about ongoing stressors. Discussed crisis plan, support from social network, calling 911, coming to the Emergency Department, and calling Suicide Hotline.  This service was provided via telemedicine using a 2-way, interactive audio and video technology.  Names of all persons participating in this telemedicine service and their role in this encounter. Name: Maxie Barb Role: PMHNP  Name: Nelly Rout Role: Attending MD  Name: Justin [name] Role: patient  Name:  Role:     Loletta Parish, NP 12/10/2021 10:20 AM

## 2021-12-10 NOTE — Discharge Instructions (Addendum)
For your seizures, continue Keppra, follow-up with your neurology office.  Come back to ER if you are having more seizures.  For your behavioral health needs you are advised to follow up with [facility] at your earliest opportunity:      [facility]      [address], SECOND FLOOR      [place], [place] [address]      [phone]      They offer psychiatry/medication management and therapy.  New patients are seen in their walk-in clinic.  Walk-in hours are Monday, Wednesday, Thursday and Friday from 8:00 am - 11:00 am for psychiatry, and Monday and Wednesday from 8:00 am - 11:00 am for therapy.  Walk-in patients are seen on a first come, first served basis, so try to arrive as early as possible for the best chance of being seen the same day.  BE SURE TO TAKE THE ELEVATOR TO THE SECOND FLOOR.  Please note that to be eligible for services you must bring an ID or a piece of mail with your name and a [facility] address.  For your shelter needs, contact the following service providers:       [facility] (operated by [facility])      [address] [place], [place] [address]      [phone]       [facility]      [address]      [place], [place] [address]      [phone]  For day shelter and other supportive services for the homeless, contact the [facility]):       [facility]      [address]      [place], [place] [address]      [phone]  For transitional housing, Pension scheme manager.  They provide longer term housing than a shelter, but there is an application process:       Holiday representative of [employer] [facility]      [address], [place] [address]      [phone]

## 2021-12-10 NOTE — BH Assessment (Signed)
[facility] Assessment Progress Note   Per Maxie Barb, NP, this voluntary pt does not require psychiatric hospitalization at this time.  Pt is psychiatrically cleared.  Discharge instructions include referral information for [facility], as well as area supportive services for the homeless.  A TOC consult has been ordered to address pt's other psychosocial needs.  EDP Marianna Fuss, MD, Donato Schultz, LCSW and pt's nurse, I-Li, have been notified.  Doylene Canning, MA Triage Specialist [phone]

## 2021-12-10 NOTE — Progress Notes (Signed)
TOC CSW received a consult in regard to shelter resources. This pt is a frequent ED user, he was provided with shelter and substance abuse resources on several different occasions. Pt has been told he will have to call on his own to seek a substance abuse placement. Pt knows about the [facility] and its resources, as this has been provided to him and pt has been homeless for a while. TOC has provided this pt with all shelter and substance abuse resources TOC has available. Pt's medications can be sent to [facility] and wellness. Pt can receive  a bus pass upon d/c. TOC will sign off at this time.   Valentina Shaggy.Rossetta Kama, MSW, LCSWA [facility]  Transitions of Care Clinical Social Worker I Direct Dial: [phone]  Fax: [phone] [email or web address]

## 2021-12-10 NOTE — ED Notes (Signed)
IV is established, blood work collected and sent to lab. Pt advised he did not have to urinate at this time. Pt was given a urinal and advised not to get up and walk to bathroom but to use urinal instead and to let us know when he had done so that way we could send urine sample to lab.

## 2021-12-10 NOTE — ED Provider Notes (Addendum)
[facility]-EMERGENCY DEPT Provider Note   CSN: [id number] Arrival date & time: 12/10/21  2992     History  Chief Complaint  Patient presents with   Seizures    Justin [name] is a 41 y.o. male.  Presented to the emergency department due to concern for seizures.  Patient reports that last night he skipped his dose of Keppra, this morning had not taken his morning dose yet when he was at a [employer] and had a seizure.  He is unsure how long it lasted.  Believes he did hit his head.  Denies any ongoing pain at this time.  Specifically no neck or back pain.  Patient endorses that he has been hearing voices more frequently and has had thoughts of suicide.  No specific plan and has not taken any action to hurt himself.    HPI     Home Medications Prior to Admission medications   Medication Sig Start Date End Date Taking? Authorizing Provider  bictegravir-emtricitabine-tenofovir AF (BIKTARVY) 50-200-25 MG TABS tablet Take 1 tablet by mouth daily. 12/08/21 01/07/22  Earney Navy, NP  citalopram (CELEXA) 10 MG tablet Take 1 tablet (10 mg total) by mouth daily. 12/08/21 01/07/22  Earney Navy, NP  levETIRAcetam (KEPPRA) 250 MG tablet Take 5 tablets (1,250 mg total) by mouth 2 (two) times daily. 12/07/21 01/06/22  Earney Navy, NP  OLANZapine zydis (ZYPREXA) 5 MG disintegrating tablet Take 1 tablet (5 mg total) by mouth at bedtime. 12/07/21 01/06/22  Earney Navy, NP      Allergies    Patient has no known allergies.    Review of Systems   Review of Systems  Constitutional:  Negative for chills and fever.  HENT:  Negative for ear pain and sore throat.   Eyes:  Negative for pain and visual disturbance.  Respiratory:  Negative for cough and shortness of breath.   Cardiovascular:  Negative for chest pain and palpitations.  Gastrointestinal:  Negative for abdominal pain and vomiting.  Genitourinary:  Negative for dysuria and hematuria.  Musculoskeletal:   Negative for arthralgias and back pain.  Skin:  Negative for color change and rash.  Neurological:  Positive for seizures. Negative for syncope.  Psychiatric/Behavioral:  Positive for suicidal ideas.   All other systems reviewed and are negative.   Physical Exam Updated Vital Signs BP 113/70   Pulse 86   Resp 15   Ht 5' 11.5" (1.816 m)   Wt 90.7 kg   SpO2 100%   BMI 27.50 kg/m  Physical Exam Vitals and nursing note reviewed.  Constitutional:      General: He is not in acute distress.    Appearance: He is well-developed.  HENT:     Head: Normocephalic and atraumatic.  Eyes:     Conjunctiva/sclera: Conjunctivae normal.  Cardiovascular:     Rate and Rhythm: Normal rate and regular rhythm.     Heart sounds: No murmur heard. Pulmonary:     Effort: Pulmonary effort is normal. No respiratory distress.     Breath sounds: Normal breath sounds.  Abdominal:     Palpations: Abdomen is soft.     Tenderness: There is no abdominal tenderness.  Musculoskeletal:        General: No swelling.     Cervical back: Neck supple.  Skin:    General: Skin is warm and dry.     Capillary Refill: Capillary refill takes less than 2 seconds.  Neurological:  Mental Status: He is alert.  Psychiatric:     Comments: Calm, normal affect     ED Results / Procedures / Treatments   Labs (all labs ordered are listed, but only abnormal results are displayed) Labs Reviewed  CBC WITH DIFFERENTIAL/PLATELET - Abnormal; Notable for the following components:      Result Value   MCV 100.6 (*)    All other components within normal limits  BASIC METABOLIC PANEL  ETHANOL  RAPID URINE DRUG SCREEN, HOSP PERFORMED    EKG None  Radiology CT Head Wo Contrast  Result Date: 12/10/2021 CLINICAL DATA:  Seizure disorder, clinical change, witnessed seizure, did not take Keppra dose last night, HIV, hepatitis C EXAM: CT HEAD WITHOUT CONTRAST TECHNIQUE: Contiguous axial images were obtained from the base of the  skull through the vertex without intravenous contrast. RADIATION DOSE REDUCTION: This exam was performed according to the departmental dose-optimization program which includes automated exposure control, adjustment of the mA and/or kV according to patient size and/or use of iterative reconstruction technique. COMPARISON:  11/22/2021 head CT FINDINGS: Brain: No evidence of parenchymal hemorrhage or extra-axial fluid collection. No mass lesion, mass effect, or midline shift. No CT evidence of acute infarction. Cerebral volume is age appropriate. No ventriculomegaly. Vascular: No acute abnormality. Skull: No evidence of calvarial fracture. Sinuses/Orbits: The visualized paranasal sinuses are essentially clear. Other:  The mastoid air cells are unopacified. IMPRESSION: Negative head CT. No evidence of acute intracranial abnormality. Electronically Signed   By: Delbert Phenix M.D.   On: 12/10/2021 08:04    Procedures Procedures    Medications Ordered in ED Medications  levETIRAcetam (KEPPRA) IVPB 1000 mg/100 mL premix (1,000 mg Intravenous New Bag/Given 12/10/21 0740)    ED Course/ Medical Decision Making/ A&P                           Medical Decision Making Risk Prescription drug management.   41 year old presents to ER due to concern for seizure.  He endorses history of seizures and endorses missing regular Keppra dose.  Basic labs are grossly stable.  No electrolyte derangement.  No anemia.  CT head is unremarkable.  Independently reviewed and interpreted results, no bleeding or mass or stroke noted.  Provided IV dose of Keppra.  From a neurologic standpoint, feel patient is stable for discharge and outpatient follow-up.  I advised patient to follow-up with neurology on an outpatient basis to discuss management of his seizures further.  As a secondary complaint, patient endorsed having suicidal ideation and auditory hallucinations.  I have discussed with Brooke NP at [facility] to see if patient can be  sent over there for eval, she said someone from their team can evaluate pt in person at [facility]. I have placed TTS consult order.  Patient is currently voluntary.  He is medically cleared at this time for psych eval.   Received update from [facility] NP with psych - they have completed eval. Feel pt stable for out pt managmenet and does not require in pt treatment. Given resources. Dc home.     Final Clinical Impression(s) / ED Diagnoses Final diagnoses:  Seizure (HCC)  Suicidal ideation    Rx / DC Orders ED Discharge Orders     None         Milagros Loll, MD 12/10/21 9604    Milagros Loll, MD 12/10/21 1013

## 2021-12-10 NOTE — ED Notes (Signed)
Seizure pads placed on bed

## 2021-12-10 NOTE — ED Triage Notes (Signed)
BIB [facility] after [place]. A/O x 4 on EMS arrival. States he missed last night's dose of Keppra.

## 2021-12-10 NOTE — ED Notes (Signed)
Provided patient with sandwiches and a bus pass

## 2021-12-10 NOTE — ED Notes (Signed)
Patient transported to CT 

## 2021-12-10 NOTE — ED Notes (Signed)
Pt advised he was in the store and was getting coffee and had a seizure. Pt advised he went unconscious. Pt advised he did hit his head. Pt advised he has a headache behind his eyes. Pt also complains of neck pain on movent. No obvious injuries are noted to any portion of his head or neck upon my assessment of same.

## 2021-12-11 ENCOUNTER — Encounter (HOSPITAL_COMMUNITY): Payer: Self-pay | Admitting: Emergency Medicine

## 2021-12-11 ENCOUNTER — Other Ambulatory Visit: Payer: Self-pay

## 2021-12-11 ENCOUNTER — Emergency Department (HOSPITAL_COMMUNITY)
Admission: EM | Admit: 2021-12-11 | Discharge: 2021-12-11 | Disposition: A | Discharge status: Home / Self Care | Payer: Self-pay | Attending: Emergency Medicine | Admitting: Emergency Medicine

## 2021-12-11 ENCOUNTER — Ambulatory Visit (HOSPITAL_COMMUNITY)
Admission: RE | Admit: 2021-12-11 | Discharge: 2021-12-11 | Disposition: A | Discharge status: Home / Self Care | Payer: No Payment, Other | Attending: Psychiatry | Admitting: Psychiatry

## 2021-12-11 DIAGNOSIS — Y900 Blood alcohol level of less than 20 mg/100 ml: Secondary | ICD-10-CM | POA: Insufficient documentation

## 2021-12-11 DIAGNOSIS — Z20822 Contact with and (suspected) exposure to covid-19: Secondary | ICD-10-CM | POA: Insufficient documentation

## 2021-12-11 DIAGNOSIS — M7918 Myalgia, other site: Secondary | ICD-10-CM | POA: Insufficient documentation

## 2021-12-11 DIAGNOSIS — R059 Cough, unspecified: Secondary | ICD-10-CM | POA: Insufficient documentation

## 2021-12-11 DIAGNOSIS — R0981 Nasal congestion: Secondary | ICD-10-CM | POA: Insufficient documentation

## 2021-12-11 DIAGNOSIS — F1510 Other stimulant abuse, uncomplicated: Secondary | ICD-10-CM | POA: Insufficient documentation

## 2021-12-11 DIAGNOSIS — R112 Nausea with vomiting, unspecified: Secondary | ICD-10-CM | POA: Insufficient documentation

## 2021-12-11 DIAGNOSIS — R5383 Other fatigue: Secondary | ICD-10-CM | POA: Insufficient documentation

## 2021-12-11 DIAGNOSIS — R197 Diarrhea, unspecified: Secondary | ICD-10-CM | POA: Insufficient documentation

## 2021-12-11 LAB — LIPASE, BLOOD: Lipase: 30 U/L (ref 11–51)

## 2021-12-11 LAB — COMPREHENSIVE METABOLIC PANEL
ALT: 32 U/L (ref 0–44)
AST: 28 U/L (ref 15–41)
Albumin: 3.8 g/dL (ref 3.5–5.0)
Alkaline Phosphatase: 97 U/L (ref 38–126)
Anion gap: 8 (ref 5–15)
BUN: 9 mg/dL (ref 6–20)
CO2: 27 mmol/L (ref 22–32)
Calcium: 9.1 mg/dL (ref 8.9–10.3)
Chloride: 106 mmol/L (ref 98–111)
Creatinine, Ser: 1.19 mg/dL (ref 0.61–1.24)
GFR, Estimated: >60 mL/min (ref >60)
Glucose, Bld: 98 mg/dL (ref 70–99)
Potassium: 4.3 mmol/L (ref 3.5–5.1)
Sodium: 141 mmol/L (ref 135–145)
Total Bilirubin: 1.1 mg/dL (ref 0.3–1.2)
Total Protein: 6.2 g/dL — ABNORMAL LOW (ref 6.5–8.1)

## 2021-12-11 LAB — RAPID URINE DRUG SCREEN, HOSP PERFORMED
Amphetamines: POSITIVE — AB
Barbiturates: NOT DETECTED
Benzodiazepines: NOT DETECTED
Cocaine: NOT DETECTED
Opiates: NOT DETECTED
Tetrahydrocannabinol: NOT DETECTED

## 2021-12-11 LAB — ACETAMINOPHEN LEVEL: Acetaminophen (Tylenol), Serum: <10 ug/mL — ABNORMAL LOW (ref 10–30)

## 2021-12-11 LAB — ETHANOL: Alcohol, Ethyl (B): <10 mg/dL (ref <10)

## 2021-12-11 LAB — CBC
HCT: 44.7 % (ref 39.0–52.0)
Hemoglobin: 14.7 g/dL (ref 13.0–17.0)
MCH: 32.7 pg (ref 26.0–34.0)
MCHC: 32.9 g/dL (ref 30.0–36.0)
MCV: 99.3 fL (ref 80.0–100.0)
Platelets: 232 10*3/uL (ref 150–400)
RBC: 4.5 MIL/uL (ref 4.22–5.81)
RDW: 11.6 % (ref 11.5–15.5)
WBC: 4.8 10*3/uL (ref 4.0–10.5)
nRBC: 0 % (ref 0.0–0.2)

## 2021-12-11 LAB — SALICYLATE LEVEL: Salicylate Lvl: <7 mg/dL — ABNORMAL LOW (ref 7.0–30.0)

## 2021-12-11 LAB — HIV-1 RNA QUANT-NO REFLEX-BLD
HIV 1 RNA Quant: NOT DETECTED Copies/mL
HIV-1 RNA Quant, Log: NOT DETECTED Log cps/mL

## 2021-12-11 LAB — SARS CORONAVIRUS 2 BY RT PCR: SARS Coronavirus 2 by RT PCR: NEGATIVE

## 2021-12-11 MED ORDER — DICYCLOMINE HCL 10 MG PO CAPS
20.0000 mg | ORAL_CAPSULE | Freq: Once | ORAL | Status: AC
  Administered 2021-12-11: 20 mg via ORAL
  Filled 2021-12-11: qty 2

## 2021-12-11 MED ORDER — ACETAMINOPHEN 325 MG PO TABS
650.0000 mg | ORAL_TABLET | ORAL | Status: AC
  Administered 2021-12-11: 650 mg via ORAL
  Filled 2021-12-11: qty 2

## 2021-12-11 MED ORDER — DICYCLOMINE HCL 20 MG PO TABS: 20.0000 mg | ORAL_TABLET | Freq: Two times a day (BID) | ORAL | 0 refills | Status: DC

## 2021-12-11 MED ORDER — BICTEGRAVIR-EMTRICITAB-TENOFOV 50-200-25 MG PO TABS
1.0000 | ORAL_TABLET | Freq: Every day | ORAL | Status: DC
  Filled 2021-12-11: qty 1

## 2021-12-11 MED ORDER — ONDANSETRON 4 MG PO TBDP: 4.0000 mg | ORAL_TABLET | Freq: Three times a day (TID) | ORAL | 0 refills | Status: DC | PRN

## 2021-12-11 NOTE — ED Triage Notes (Signed)
Pt now endorses SI. States he wants to only kill himself because he is the problem in his life.

## 2021-12-11 NOTE — ED Provider Notes (Signed)
[name] EMERGENCY DEPARTMENT Provider Note   CSN: [id number] Arrival date & time: 12/11/21  0092     History  Chief Complaint  Patient presents with   Generalized Body Aches    Justin [name] is a 41 y.o. male.  HPI  Patient is a 41 year old male presented emergency room with complaints of nausea vomiting diarrhea cough congestion fatigue for the past 2 or 3 days.  He states that over this period of time he has also felt suicidal.  When asked if he felt suicidal before he states yes.  He denies any chest pain or difficulty breathing.  No fevers at home.  No other associated symptoms.  He was seen emergency department yesterday and was evaluated by psychiatry.  He denies any new psychiatric symptoms.  Denies any homicidal thoughts or hallucinations.  Denies any recreational drug use or alcohol use.     Home Medications Prior to Admission medications   Medication Sig Start Date End Date Taking? Authorizing Provider  bictegravir-emtricitabine-tenofovir AF (BIKTARVY) 50-200-25 MG TABS tablet Take 1 tablet by mouth daily. 12/08/21 01/07/22  Earney Navy, NP  citalopram (CELEXA) 10 MG tablet Take 1 tablet (10 mg total) by mouth daily. 12/08/21 01/07/22  Earney Navy, NP  levETIRAcetam (KEPPRA) 250 MG tablet Take 5 tablets (1,250 mg total) by mouth 2 (two) times daily. 12/10/21 01/09/22  Milagros Loll, MD  OLANZapine zydis (ZYPREXA) 5 MG disintegrating tablet Take 1 tablet (5 mg total) by mouth at bedtime. 12/07/21 01/06/22  Earney Navy, NP      Allergies    Patient has no known allergies.    Review of Systems   Review of Systems  Physical Exam Updated Vital Signs BP 118/73 (BP Location: Right Arm)   Pulse 94   Temp 97.9 F (36.6 C) (Oral)   Resp 18   SpO2 100%  Physical Exam Vitals and nursing note reviewed.  Constitutional:      General: He is not in acute distress. HENT:     Head: Normocephalic and atraumatic.     Nose: Nose  normal.  Eyes:     General: No scleral icterus. Cardiovascular:     Rate and Rhythm: Normal rate and regular rhythm.     Pulses: Normal pulses.     Heart sounds: Normal heart sounds.  Pulmonary:     Effort: Pulmonary effort is normal. No respiratory distress.     Breath sounds: No wheezing.  Abdominal:     Palpations: Abdomen is soft.     Tenderness: There is no abdominal tenderness.  Musculoskeletal:     Cervical back: Normal range of motion.     Right lower leg: No edema.     Left lower leg: No edema.  Skin:    General: Skin is warm and dry.     Capillary Refill: Capillary refill takes less than 2 seconds.  Neurological:     Mental Status: He is alert. Mental status is at baseline.  Psychiatric:        Mood and Affect: Mood normal.        Behavior: Behavior normal.     ED Results / Procedures / Treatments   Labs (all labs ordered are listed, but only abnormal results are displayed) Labs Reviewed  COMPREHENSIVE METABOLIC PANEL - Abnormal; Notable for the following components:      Result Value   Total Protein 6.2 (*)    All other components within normal limits  SALICYLATE LEVEL -  Abnormal; Notable for the following components:   Salicylate Lvl <7.0 (*)    All other components within normal limits  ACETAMINOPHEN LEVEL - Abnormal; Notable for the following components:   Acetaminophen (Tylenol), Serum <10 (*)    All other components within normal limits  RAPID URINE DRUG SCREEN, HOSP PERFORMED - Abnormal; Notable for the following components:   Amphetamines POSITIVE (*)    All other components within normal limits  SARS CORONAVIRUS 2 BY RT PCR  ETHANOL  CBC  LIPASE, BLOOD    EKG None  Radiology CT Head Wo Contrast  Result Date: 12/10/2021 CLINICAL DATA:  Seizure disorder, clinical change, witnessed seizure, did not take Keppra dose last night, HIV, hepatitis C EXAM: CT HEAD WITHOUT CONTRAST TECHNIQUE: Contiguous axial images were obtained from the base of the  skull through the vertex without intravenous contrast. RADIATION DOSE REDUCTION: This exam was performed according to the departmental dose-optimization program which includes automated exposure control, adjustment of the mA and/or kV according to patient size and/or use of iterative reconstruction technique. COMPARISON:  11/22/2021 head CT FINDINGS: Brain: No evidence of parenchymal hemorrhage or extra-axial fluid collection. No mass lesion, mass effect, or midline shift. No CT evidence of acute infarction. Cerebral volume is age appropriate. No ventriculomegaly. Vascular: No acute abnormality. Skull: No evidence of calvarial fracture. Sinuses/Orbits: The visualized paranasal sinuses are essentially clear. Other:  The mastoid air cells are unopacified. IMPRESSION: Negative head CT. No evidence of acute intracranial abnormality. Electronically Signed   By: Delbert Phenix M.D.   On: 12/10/2021 08:04    Procedures Procedures    Medications Ordered in ED Medications  bictegravir-emtricitabine-tenofovir AF (BIKTARVY) 50-200-25 MG per tablet 1 tablet (has no administration in time range)  dicyclomine (BENTYL) capsule 20 mg (has no administration in time range)  acetaminophen (TYLENOL) tablet 650 mg (has no administration in time range)    ED Course/ Medical Decision Making/ A&P                           Medical Decision Making Amount and/or Complexity of Data Reviewed Labs: ordered.  Risk OTC drugs. Prescription drug management.   This patient presents to the ED for concern of nausea vomiting diarrhea cough congestion fatigue, this involves a number of treatment options, and is a complaint that carries with it a moderate risk of complications and morbidity.  The differential diagnosis includes most likely viral illness, also considered intra-abdominal infection such as cholecystitis, appendicitis, renal calculus also considered.   Co morbidities: Discussed in HPI   Brief History:  Patient  is a 41 year old male presented emergency room with complaints of nausea vomiting diarrhea cough congestion fatigue for the past 2 or 3 days.  He states that over this period of time he has also felt suicidal.  When asked if he felt suicidal before he states yes.  He denies any chest pain or difficulty breathing.  No fevers at home.  No other associated symptoms.  He was seen emergency department yesterday and was evaluated by psychiatry.  He denies any new psychiatric symptoms.  Denies any homicidal thoughts or hallucinations.  Denies any recreational drug use or alcohol use.  Physical exam unremarkable no abdominal tenderness.  Patient is well-appearing.  EMR reviewed including pt PMHx, past surgical history and past visits to ER.   See HPI for more details   Lab Tests:   I ordered and independently interpreted labs. Labs notable for positive for vitamins.  Otherwise unremarkable labs   Imaging Studies:  No imaging studies ordered for this patient    Cardiac Monitoring:  NA NA   Medicines ordered:  I ordered medication including Tylenol, Bentyl for pain Reevaluation of the patient after these medicines showed that the patient resolved I have reviewed the patients home medicines and have made adjustments as needed   Critical Interventions:     Consults/Attending Physician   I discussed this case with my attending physician who cosigned this note including patient's presenting symptoms, physical exam, and planned diagnostics and interventions. Attending physician stated agreement with plan or made changes to plan which were implemented.   Reevaluation:  After the interventions noted above I re-evaluated patient and found that they have :resolved   Social Determinants of Health:      Problem List / ED Course:  Chronic suicidality was evaluated by psychiatry yesterday.  He does not endorse any changes in his suicidal thoughts today.  He does not have any  plan.  He seems to be future oriented and is talking about how he would like to stay here and rest for a day. Abdominal pain.  Nontender reassuring work-up will discharge home with close follow-up with PCP.   Dispostion:  After consideration of the diagnostic results and the patients response to treatment, I feel that the patent would benefit from Bentyl, Zofran for symptom control suspect viral cause of his symptoms.  Follow-up with PCP.  Return precautions discussed.   Final Clinical Impression(s) / ED Diagnoses Final diagnoses:  Nausea vomiting and diarrhea  Other fatigue    Rx / DC Orders ED Discharge Orders     None         Gailen ShelterFondaw, Marleigh Kaylor S, [place] 12/11/21 0920    Gloris Manchesterixon, Ryan, MD 12/12/21 1658

## 2021-12-11 NOTE — Discharge Instructions (Signed)
Please use Zofran as needed for nausea.  Please take your medications as prescribed.  I given you prescription for Bentyl which was given to you for abdominal pain.  Please follow-up with your primary care provider

## 2021-12-11 NOTE — ED Notes (Signed)
Pt escorted out by security while cursing at staff.

## 2021-12-11 NOTE — ED Triage Notes (Signed)
Pt reported to ED with c/o generalized body aches, chills and runny nose. Expresses concerns for COVID.

## 2021-12-11 NOTE — H&P (Signed)
Behavioral Health Medical Screening Exam  Justin [name] is an 41 y.o. male known to [facility] who presented voluntarily to [facility] as a walk-in for assessment. Of note patient has presented multiple times over past week; seen earlier this day at [facility] for nause/vomiting and was escorted off premises due to behavior. This provider assessed patient at [facility] on Friday 12/10/21 where patient expressed he was homeless and having difficulty adjusting, he requested to stay in the ED "for a few days for housing", [facility] consult was placed and he was given multiple resources upon discharge.   Today patient expressed similar concerns regarding "not having anywhere to go" and became agitated when provider inquired about resources previously provided. Patient refused vitals, stood up and began cursing at provider before leaving without completing assessment. Security on standby. Patient appeared alert and oriented with no signs of acute distress at the time of this encounter. Based on patient history, secondary gain cannot be ruled out for this patient.   Total Time spent with patient: 15 minutes  Psychiatric Specialty Exam: Physical Exam Vitals and nursing note reviewed.  Constitutional:      Appearance: He is normal weight.  HENT:     Head: Normocephalic.     Nose: Nose normal.     Mouth/Throat:     Mouth: Mucous membranes are moist.     Pharynx: Oropharynx is clear.  Eyes:     Pupils: Pupils are equal, round, and reactive to light.  Cardiovascular:     Rate and Rhythm: Normal rate.     Pulses: Normal pulses.  Pulmonary:     Effort: Pulmonary effort is normal.  Abdominal:     General: Abdomen is flat.  Musculoskeletal:        General: Normal range of motion.     Cervical back: Normal range of motion.  Skin:    General: Skin is warm.  Neurological:     Mental Status: He is alert and oriented to person, place, and time.  Psychiatric:        Attention and Perception: Attention and perception  normal.        Mood and Affect: Affect is labile, blunt and angry.        Speech: Speech is tangential.        Behavior: Behavior is uncooperative.        Thought Content: Thought content is not paranoid or delusional. Thought content does not include homicidal or suicidal plan.    Review of Systems  Psychiatric/Behavioral:  Positive for behavioral problems and dysphoric mood.   All other systems reviewed and are negative.  There were no vitals taken for this visit.There is no height or weight on file to calculate BMI. General Appearance: Casual Eye Contact:  Fair Speech:  Clear and Coherent Volume:  Increased Mood:  Dysphoric and Irritable Affect:  Blunt and Labile Thought Process:  Goal Directed Orientation:  Full (Time, Place, and Person) Thought Content:  Illogical Suicidal Thoughts:   unable to fully assess  Homicidal Thoughts:   unable to fully assess Memory:  Immediate;   unable to fully assess Recent;   unable to fully assess Remote;   unable to fully assess Judgement:  Poor Insight:  Shallow and poor Psychomotor Activity:  Normal Concentration: Concentration: Fair and Attention Span: Fair Recall:  [employer] of Knowledge:Fair Language: Fair Akathisia:  NA Handed:  Right AIMS (if indicated):    Assets:  Resilience Social Support Sleep:     Musculoskeletal: Strength &  Muscle Tone: within normal limits Gait & Station: normal Patient leans: N/A  There were no vitals taken for this visit.  Recommendations: Patient became aggressive and upset; refused vital signs and completion of assessment. Left [facility] before completion of assessment   Loletta Parish, NP 12/11/2021, 7:04 PM

## 2021-12-11 NOTE — ED Notes (Signed)
Nurse tech told the pt numerous times that the pt has been discharged and its time for him to leave. Pt refused to get up, started cursing at the nurse tech and kicking the [employer]. Security at bedside.

## 2021-12-21 ENCOUNTER — Other Ambulatory Visit: Payer: Self-pay | Admitting: Pharmacist

## 2021-12-21 DIAGNOSIS — B20 Human immunodeficiency virus [HIV] disease: Secondary | ICD-10-CM

## 2021-12-21 MED ORDER — BICTEGRAVIR-EMTRICITAB-TENOFOV 50-200-25 MG PO TABS: 1.0000 | ORAL_TABLET | Freq: Every day | ORAL | 0 refills | Status: DC

## 2021-12-21 NOTE — Progress Notes (Signed)
Medication Samples have been provided to the patient.  Drug name: Biktarvy        Strength: 50/200/25 mg       Qty: 14 tablets (2 bottles) LOT: CMWKWA   Exp.Date: 9/25  Dosing instructions: Take one tablet by mouth once daily  The patient has been instructed regarding the correct time, dose, and frequency of taking this medication, including desired effects and most common side effects.   Marchella Hibbard, PharmD, CPP Clinical Pharmacist Practitioner Infectious Diseases Clinical Pharmacist [facility]  

## 2021-12-22 ENCOUNTER — Ambulatory Visit: Payer: Self-pay | Admitting: Internal Medicine

## 2021-12-22 ENCOUNTER — Telehealth: Payer: Self-pay

## 2021-12-22 NOTE — Telephone Encounter (Signed)
Called patient to see if he would be able to make it to today's appointment. No answer and no secure voicemail, did not leave message.   Sandie Ano, RN

## 2021-12-24 ENCOUNTER — Emergency Department (HOSPITAL_COMMUNITY): Payer: Self-pay

## 2021-12-24 ENCOUNTER — Inpatient Hospital Stay (HOSPITAL_COMMUNITY)
Admission: EM | Admit: 2021-12-24 | Discharge: 2021-12-26 | DRG: 101 | Disposition: A | Discharge status: Home / Self Care | Payer: Self-pay | Attending: Internal Medicine | Admitting: Internal Medicine

## 2021-12-24 DIAGNOSIS — F419 Anxiety disorder, unspecified: Secondary | ICD-10-CM | POA: Diagnosis present

## 2021-12-24 DIAGNOSIS — Z91128 Patient's intentional underdosing of medication regimen for other reason: Secondary | ICD-10-CM

## 2021-12-24 DIAGNOSIS — F4329 Adjustment disorder with other symptoms: Secondary | ICD-10-CM | POA: Diagnosis present

## 2021-12-24 DIAGNOSIS — Z79899 Other long term (current) drug therapy: Secondary | ICD-10-CM

## 2021-12-24 DIAGNOSIS — B20 Human immunodeficiency virus [HIV] disease: Secondary | ICD-10-CM | POA: Diagnosis present

## 2021-12-24 DIAGNOSIS — G40909 Epilepsy, unspecified, not intractable, without status epilepticus: Principal | ICD-10-CM | POA: Diagnosis present

## 2021-12-24 DIAGNOSIS — F4323 Adjustment disorder with mixed anxiety and depressed mood: Secondary | ICD-10-CM | POA: Diagnosis present

## 2021-12-24 DIAGNOSIS — Z833 Family history of diabetes mellitus: Secondary | ICD-10-CM

## 2021-12-24 DIAGNOSIS — T426X6A Underdosing of other antiepileptic and sedative-hypnotic drugs, initial encounter: Secondary | ICD-10-CM | POA: Diagnosis present

## 2021-12-24 DIAGNOSIS — B182 Chronic viral hepatitis C: Secondary | ICD-10-CM | POA: Diagnosis present

## 2021-12-24 DIAGNOSIS — Z87891 Personal history of nicotine dependence: Secondary | ICD-10-CM

## 2021-12-24 DIAGNOSIS — R569 Unspecified convulsions: Principal | ICD-10-CM

## 2021-12-24 DIAGNOSIS — F1594 Other stimulant use, unspecified with stimulant-induced mood disorder: Secondary | ICD-10-CM | POA: Diagnosis present

## 2021-12-24 DIAGNOSIS — F323 Major depressive disorder, single episode, severe with psychotic features: Secondary | ICD-10-CM | POA: Diagnosis present

## 2021-12-24 DIAGNOSIS — E875 Hyperkalemia: Secondary | ICD-10-CM | POA: Diagnosis present

## 2021-12-24 LAB — COMPREHENSIVE METABOLIC PANEL
ALT: 48 U/L — ABNORMAL HIGH (ref 0–44)
AST: 90 U/L — ABNORMAL HIGH (ref 15–41)
Albumin: 3.6 g/dL (ref 3.5–5.0)
Alkaline Phosphatase: 60 U/L (ref 38–126)
Anion gap: 9 (ref 5–15)
BUN: 18 mg/dL (ref 6–20)
CO2: 21 mmol/L — ABNORMAL LOW (ref 22–32)
Calcium: 8 mg/dL — ABNORMAL LOW (ref 8.9–10.3)
Chloride: 106 mmol/L (ref 98–111)
Creatinine, Ser: 1.08 mg/dL (ref 0.61–1.24)
GFR, Estimated: >60 mL/min (ref >60)
Glucose, Bld: 104 mg/dL — ABNORMAL HIGH (ref 70–99)
Potassium: 5.3 mmol/L — ABNORMAL HIGH (ref 3.5–5.1)
Sodium: 136 mmol/L (ref 135–145)
Total Bilirubin: 3.1 mg/dL — ABNORMAL HIGH (ref 0.3–1.2)
Total Protein: 6.5 g/dL (ref 6.5–8.1)

## 2021-12-24 LAB — CBC WITH DIFFERENTIAL/PLATELET
Abs Immature Granulocytes: 0.04 10*3/uL (ref 0.00–0.07)
Basophils Absolute: 0 10*3/uL (ref 0.0–0.1)
Basophils Relative: 0 %
Eosinophils Absolute: 0 10*3/uL (ref 0.0–0.5)
Eosinophils Relative: 0 %
HCT: 41.6 % (ref 39.0–52.0)
Hemoglobin: 13.7 g/dL (ref 13.0–17.0)
Immature Granulocytes: 0 %
Lymphocytes Relative: 6 %
Lymphs Abs: 0.6 10*3/uL — ABNORMAL LOW (ref 0.7–4.0)
MCH: 33.3 pg (ref 26.0–34.0)
MCHC: 32.9 g/dL (ref 30.0–36.0)
MCV: 101 fL — ABNORMAL HIGH (ref 80.0–100.0)
Monocytes Absolute: 0.9 10*3/uL (ref 0.1–1.0)
Monocytes Relative: 9 %
Neutro Abs: 8.1 10*3/uL — ABNORMAL HIGH (ref 1.7–7.7)
Neutrophils Relative %: 85 %
Platelets: 112 10*3/uL — ABNORMAL LOW (ref 150–400)
RBC: 4.12 MIL/uL — ABNORMAL LOW (ref 4.22–5.81)
RDW: 12.6 % (ref 11.5–15.5)
WBC: 9.7 10*3/uL (ref 4.0–10.5)
nRBC: 0 % (ref 0.0–0.2)

## 2021-12-24 LAB — ETHANOL: Alcohol, Ethyl (B): <10 mg/dL (ref <10)

## 2021-12-24 MED ORDER — SODIUM CHLORIDE 0.9 % IV SOLN
2000.0000 mg | Freq: Once | INTRAVENOUS | Status: AC
  Administered 2021-12-24: 2000 mg via INTRAVENOUS
  Filled 2021-12-24: qty 20

## 2021-12-24 MED ORDER — LORAZEPAM 2 MG/ML IJ SOLN
2.0000 mg | Freq: Once | INTRAMUSCULAR | Status: AC
  Administered 2021-12-24: 2 mg via INTRAMUSCULAR
  Filled 2021-12-24: qty 1

## 2021-12-24 MED ORDER — ONDANSETRON HCL 4 MG/2ML IJ SOLN: 4.0000 mg | Freq: Four times a day (QID) | INTRAMUSCULAR | Status: DC | PRN

## 2021-12-24 MED ORDER — ONDANSETRON HCL 4 MG PO TABS: 4.0000 mg | ORAL_TABLET | Freq: Four times a day (QID) | ORAL | Status: DC | PRN

## 2021-12-24 MED ORDER — BICTEGRAVIR-EMTRICITAB-TENOFOV 50-200-25 MG PO TABS
1.0000 | ORAL_TABLET | Freq: Every day | ORAL | Status: DC
  Administered 2021-12-25 – 2021-12-26 (×2): 1 via ORAL
  Filled 2021-12-24 (×2): qty 1

## 2021-12-24 MED ORDER — CITALOPRAM HYDROBROMIDE 20 MG PO TABS
10.0000 mg | ORAL_TABLET | Freq: Every day | ORAL | Status: DC
  Administered 2021-12-25 – 2021-12-26 (×2): 10 mg via ORAL
  Filled 2021-12-24 (×2): qty 1

## 2021-12-24 MED ORDER — OLANZAPINE 5 MG PO TBDP
5.0000 mg | ORAL_TABLET | Freq: Every day | ORAL | Status: DC
  Administered 2021-12-25 (×2): 5 mg via ORAL
  Filled 2021-12-24 (×2): qty 1

## 2021-12-24 MED ORDER — LEVETIRACETAM 500 MG PO TABS
1250.0000 mg | ORAL_TABLET | Freq: Two times a day (BID) | ORAL | Status: DC
  Administered 2021-12-25 – 2021-12-26 (×4): 1250 mg via ORAL
  Filled 2021-12-24 (×4): qty 1

## 2021-12-24 MED ORDER — ENOXAPARIN SODIUM 40 MG/0.4ML IJ SOSY
40.0000 mg | PREFILLED_SYRINGE | INTRAMUSCULAR | Status: DC
  Administered 2021-12-25 – 2021-12-26 (×2): 40 mg via SUBCUTANEOUS
  Filled 2021-12-24 (×2): qty 0.4

## 2021-12-24 MED ORDER — DEXTROSE-NACL 5-0.45 % IV SOLN: INTRAVENOUS | Status: DC

## 2021-12-24 NOTE — ED Notes (Signed)
Pt began having seizure at 1632. Lasted for 1 min. Suction used, concern for aspiration. Oxygen applied.

## 2021-12-24 NOTE — ED Provider Notes (Signed)
[facility]-EMERGENCY DEPT Provider Note   CSN: [id number] Arrival date & time: 12/24/21  1626     History  Chief Complaint  Patient presents with   Seizures    Justin [name] is a 41 y.o. male.  Patient has a history of seizures and substance abuse.  He had 1 seizure today and then told medical staff that he has not been taking his medicines for the last 3 days.  He had another seizure in the emergency department.  The history is provided by the patient and medical records. No language interpreter was used.  Seizures Seizure activity on arrival: yes   Seizure type:  Grand mal Initial focality:  None Episode characteristics: abnormal movements   Postictal symptoms: confusion   Return to baseline: no   Severity:  Moderate Timing:  Clustered Progression:  Improving Context: not alcohol withdrawal        Home Medications Prior to Admission medications   Medication Sig Start Date End Date Taking? Authorizing Provider  bictegravir-emtricitabine-tenofovir AF (BIKTARVY) 50-200-25 MG TABS tablet Take 1 tablet by mouth daily. 12/08/21 01/07/22  Earney Navy, NP  bictegravir-emtricitabine-tenofovir AF (BIKTARVY) 50-200-25 MG TABS tablet Take 1 tablet by mouth daily for 14 days. 12/15/21 12/29/21  Jennette Kettle, RPH-CPP  citalopram (CELEXA) 10 MG tablet Take 1 tablet (10 mg total) by mouth daily. 12/08/21 01/07/22  Earney Navy, NP  dicyclomine (BENTYL) 20 MG tablet Take 1 tablet (20 mg total) by mouth 2 (two) times daily. 12/11/21   Gailen Shelter, PA  levETIRAcetam (KEPPRA) 250 MG tablet Take 5 tablets (1,250 mg total) by mouth 2 (two) times daily. 12/10/21 01/09/22  Milagros Loll, MD  OLANZapine zydis (ZYPREXA) 5 MG disintegrating tablet Take 1 tablet (5 mg total) by mouth at bedtime. 12/07/21 01/06/22  Earney Navy, NP  ondansetron (ZOFRAN-ODT) 4 MG disintegrating tablet Take 1 tablet (4 mg total) by mouth every 8 (eight) hours as needed for  nausea or vomiting. 12/11/21   Gailen Shelter, PA      Allergies    Patient has no known allergies.    Review of Systems   Review of Systems  Unable to perform ROS: Mental status change  Neurological:  Positive for seizures.    Physical Exam Updated Vital Signs BP 103/65   Pulse 90   Temp 98.6 F (37 C) (Oral)   Resp (!) 24   SpO2 100%  Physical Exam Vitals and nursing note reviewed.  Constitutional:      Appearance: He is well-developed.     Comments: Lethargic  HENT:     Head: Normocephalic.     Nose: Nose normal.  Eyes:     General: No scleral icterus.    Conjunctiva/sclera: Conjunctivae normal.  Neck:     Thyroid: No thyromegaly.  Cardiovascular:     Rate and Rhythm: Normal rate and regular rhythm.     Heart sounds: No murmur heard.    No friction rub. No gallop.  Pulmonary:     Breath sounds: No stridor. No wheezing or rales.  Chest:     Chest wall: No tenderness.  Abdominal:     General: There is no distension.     Tenderness: There is no abdominal tenderness. There is no rebound.  Musculoskeletal:        General: Normal range of motion.     Cervical back: Neck supple.  Lymphadenopathy:     Cervical: No cervical adenopathy.  Skin:  Findings: No erythema or rash.  Neurological:     Motor: No abnormal muscle tone.     Coordination: Coordination normal.  Psychiatric:        Behavior: Behavior normal.     ED Results / Procedures / Treatments   Labs (all labs ordered are listed, but only abnormal results are displayed) Labs Reviewed  COMPREHENSIVE METABOLIC PANEL - Abnormal; Notable for the following components:      Result Value   Potassium 5.3 (*)    CO2 21 (*)    Glucose, Bld 104 (*)    Calcium 8.0 (*)    AST 90 (*)    ALT 48 (*)    Total Bilirubin 3.1 (*)    All other components within normal limits  CBC WITH DIFFERENTIAL/PLATELET - Abnormal; Notable for the following components:   RBC 4.12 (*)    MCV 101.0 (*)    Platelets 112 (*)     Neutro Abs 8.1 (*)    Lymphs Abs 0.6 (*)    All other components within normal limits  ETHANOL  CBC WITH DIFFERENTIAL/PLATELET  RAPID URINE DRUG SCREEN, HOSP PERFORMED  LEVETIRACETAM LEVEL    EKG None  Radiology CT Head Wo Contrast  Result Date: 12/24/2021 CLINICAL DATA:  Dizziness EXAM: CT HEAD WITHOUT CONTRAST TECHNIQUE: Contiguous axial images were obtained from the base of the skull through the vertex without intravenous contrast. RADIATION DOSE REDUCTION: This exam was performed according to the departmental dose-optimization program which includes automated exposure control, adjustment of the mA and/or kV according to patient size and/or use of iterative reconstruction technique. COMPARISON:  12/10/2021 FINDINGS: Brain: No evidence of acute infarction, hemorrhage, hydrocephalus, extra-axial collection or mass lesion/mass effect. Vascular: No hyperdense vessel or unexpected calcification. Skull: Normal. Negative for fracture or focal lesion. Sinuses/Orbits: No acute finding. Other: None. IMPRESSION: No acute intracranial abnormality noted. Electronically Signed   By: Alcide Clever M.D.   On: 12/24/2021 19:30   DG Chest Port 1 View  Result Date: 12/24/2021 CLINICAL DATA:  Shortness of breath.  Seizures. EXAM: PORTABLE CHEST 1 VIEW COMPARISON:  February 17, 2021 FINDINGS: The heart size and mediastinal contours are within normal limits. Both lungs are clear. The visualized skeletal structures are unremarkable. IMPRESSION: No active disease. Electronically Signed   By: Gerome Sam III M.D.   On: 12/24/2021 18:03    Procedures Procedures    Medications Ordered in ED Medications  levETIRAcetam (KEPPRA) 2,000 mg in sodium chloride 0.9 % 250 mL IVPB (0 mg Intravenous Stopped 12/24/21 1741)  LORazepam (ATIVAN) injection 2 mg (2 mg Intramuscular Given 12/24/21 1715)    ED Course/ Medical Decision Making/ A&P  CRITICAL CARE Performed by: Bethann Berkshire Total critical care time: 45  minutes Critical care time was exclusive of separately billable procedures and treating other patients. Critical care was necessary to treat or prevent imminent or life-threatening deterioration. Critical care was time spent personally by me on the following activities: development of treatment plan with patient and/or surrogate as well as nursing, discussions with consultants, evaluation of patient's response to treatment, examination of patient, obtaining history from patient or surrogate, ordering and performing treatments and interventions, ordering and review of laboratory studies, ordering and review of radiographic studies, pulse oximetry and re-evaluation of patient's condition.   Patient with a history of seizures.  Patient had 2 seizures at today.  He was given Ativan 2 mg IM in the emergency department and then loaded with 2 g of Keppra.  After number  hours patient did awake some and was oriented x3.  I spoke with neurology and they recommended admission to hospitalist and observation                         Medical Decision Making Amount and/or Complexity of Data Reviewed Labs: ordered. Radiology: ordered.  Risk Prescription drug management. Decision regarding hospitalization.  This patient presents to the ED for concern of seizure, this involves an extensive number of treatment options, and is a complaint that carries with it a high risk of complications and morbidity.  The differential diagnosis includes drug abuse, not taking her Keppra   Co morbidities that complicate the patient evaluation  Drug abuse and seizures   Additional history obtained:  Additional history obtained from patient, paramedic External records from outside source obtained and reviewed including hospital records   Lab Tests:  I Ordered, and personally interpreted labs.  The pertinent results include: Chemistries and liver studies show mild elevated liver studies, white count 6.1   Imaging  Studies ordered:  I ordered imaging studies including CT head and chest x-ray I independently visualized and interpreted imaging which showed unremarkable I agree with the radiologist interpretation   Cardiac Monitoring: / EKG:  The patient was maintained on a cardiac monitor.  I personally viewed and interpreted the cardiac monitored which showed an underlying rhythm of: Normal sinus rhythm   Consultations Obtained:  I requested consultation with the neurology and hospitalist,  and discussed lab and imaging findings as well as pertinent plan - they recommend: Neurology recommends admission for observation   Problem List / ED Course / Critical interventions / Medication management  Seizures and drug I ordered medication including Keppra for seizure, also Ativan Reevaluation of the patient after these medicines showed that the patient improved I have reviewed the patients home medicines and have made adjustments as needed   Social Determinants of Health:  Substance abuse   Test / Admission - Considered:  No other test necessary  Seizures secondary to taking amphetamines and not taking his Keppra.  I spoke to neurology and they request hospitalist admission        Final Clinical Impression(s) / ED Diagnoses Final diagnoses:  Seizure [facility])    Rx / DC Orders ED Discharge Orders     None         Bethann Berkshire, MD 12/27/21 1650

## 2021-12-25 ENCOUNTER — Encounter (HOSPITAL_COMMUNITY): Payer: Self-pay | Admitting: Internal Medicine

## 2021-12-25 ENCOUNTER — Other Ambulatory Visit: Payer: Self-pay

## 2021-12-25 DIAGNOSIS — B182 Chronic viral hepatitis C: Secondary | ICD-10-CM

## 2021-12-25 DIAGNOSIS — B20 Human immunodeficiency virus [HIV] disease: Secondary | ICD-10-CM

## 2021-12-25 LAB — CBC
HCT: 45.1 % (ref 39.0–52.0)
Hemoglobin: 15 g/dL (ref 13.0–17.0)
MCH: 32.9 pg (ref 26.0–34.0)
MCHC: 33.3 g/dL (ref 30.0–36.0)
MCV: 98.9 fL (ref 80.0–100.0)
Platelets: 155 10*3/uL (ref 150–400)
RBC: 4.56 MIL/uL (ref 4.22–5.81)
RDW: 12.5 % (ref 11.5–15.5)
WBC: 6.1 10*3/uL (ref 4.0–10.5)
nRBC: 0 % (ref 0.0–0.2)

## 2021-12-25 LAB — COMPREHENSIVE METABOLIC PANEL
ALT: 45 U/L — ABNORMAL HIGH (ref 0–44)
AST: 54 U/L — ABNORMAL HIGH (ref 15–41)
Albumin: 3.9 g/dL (ref 3.5–5.0)
Alkaline Phosphatase: 66 U/L (ref 38–126)
Anion gap: 8 (ref 5–15)
BUN: 13 mg/dL (ref 6–20)
CO2: 25 mmol/L (ref 22–32)
Calcium: 9.3 mg/dL (ref 8.9–10.3)
Chloride: 108 mmol/L (ref 98–111)
Creatinine, Ser: 1.01 mg/dL (ref 0.61–1.24)
GFR, Estimated: >60 mL/min (ref >60)
Glucose, Bld: 114 mg/dL — ABNORMAL HIGH (ref 70–99)
Potassium: 3.7 mmol/L (ref 3.5–5.1)
Sodium: 141 mmol/L (ref 135–145)
Total Bilirubin: 3 mg/dL — ABNORMAL HIGH (ref 0.3–1.2)
Total Protein: 7.1 g/dL (ref 6.5–8.1)

## 2021-12-25 MED ORDER — OXYCODONE HCL 5 MG PO TABS
5.0000 mg | ORAL_TABLET | ORAL | Status: DC | PRN
  Administered 2021-12-25: 5 mg via ORAL
  Filled 2021-12-25 (×2): qty 1

## 2021-12-26 LAB — BASIC METABOLIC PANEL
Anion gap: 6 (ref 5–15)
BUN: 11 mg/dL (ref 6–20)
CO2: 23 mmol/L (ref 22–32)
Calcium: 8.7 mg/dL — ABNORMAL LOW (ref 8.9–10.3)
Chloride: 109 mmol/L (ref 98–111)
Creatinine, Ser: 0.95 mg/dL (ref 0.61–1.24)
GFR, Estimated: >60 mL/min (ref >60)
Glucose, Bld: 112 mg/dL — ABNORMAL HIGH (ref 70–99)
Potassium: 3.9 mmol/L (ref 3.5–5.1)
Sodium: 138 mmol/L (ref 135–145)

## 2021-12-26 LAB — CBC
HCT: 46.6 % (ref 39.0–52.0)
Hemoglobin: 15 g/dL (ref 13.0–17.0)
MCH: 33 pg (ref 26.0–34.0)
MCHC: 32.2 g/dL (ref 30.0–36.0)
MCV: 102.6 fL — ABNORMAL HIGH (ref 80.0–100.0)
Platelets: 147 10*3/uL — ABNORMAL LOW (ref 150–400)
RBC: 4.54 MIL/uL (ref 4.22–5.81)
RDW: 12.3 % (ref 11.5–15.5)
WBC: 3.2 10*3/uL — ABNORMAL LOW (ref 4.0–10.5)
nRBC: 0 % (ref 0.0–0.2)

## 2021-12-26 MED ORDER — LEVETIRACETAM 250 MG PO TABS: 1250.0000 mg | ORAL_TABLET | Freq: Two times a day (BID) | ORAL | 2 refills | Status: DC

## 2021-12-26 NOTE — Plan of Care (Signed)
Pt will discharge after lunch and ambulation in hallway.

## 2021-12-26 NOTE — Discharge Summary (Signed)
[facility]  Physician Discharge Summary   Patient ID: Justin [name] MRN: [id number] DOB/AGE: 1981-01-12 41 y.o.  Admit date: 12/24/2021 Discharge date: 12/26/2021    PCP: Pcp, No  DISCHARGE DIAGNOSES:  Principal Problem:   Seizure (HCC) Active Problems:   Adjustment disorder with mixed emotional features   Human immunodeficiency virus (HIV) disease (HCC)   Anxiety   Chronic hepatitis C without hepatic coma (HCC)   Amphetamine-induced mood disorder (HCC)   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HCC)   RECOMMENDATIONS FOR OUTPATIENT FOLLOW UP: Follow-up with infectious disease for further management of HIV   Home Health: None Equipment/Devices: None  CODE STATUS: Full code  DISCHARGE CONDITION: fair  Diet recommendation: As before  INITIAL HISTORY: 41 y.o. male with medical history significant of seizure disorder, chronic hepatitis, polysubstance abuse, HIV disease, hepatitis C, history of syphilis among other things who presented to the ER after having a seizure episode at home.  Patient has not been taking his Keppra for a few days.  Patient was given Ativan and loaded with Keppra.  He was hospitalized for further management.    HOSPITAL COURSE:   Breakthrough seizure in a patient with known history of seizure disorder Patient had breakthrough seizure as he missed taking his Keppra at home.  He was loaded with the 2000 mg of Keppra.  His usual home regimen was resumed.  Still somewhat lethargic.  CT head did not show any acute findings.   Diet was advanced.  He was mobilized.  He has remained seizure-free for greater than 24 hours.  Prescription for Keppra was sent to his pharmacy.  He was told to be compliant with his medication regimen.     Hyperkalemia Repleted   Polysubstance abuse Usually abuses amphetamines.  Urine drug screen positive for amphetamines.  He was counseled.   HIV Continue HAART.  CD4 was 514 in early June.  Major  depressive disorder Continue home medications.  Chronic hepatitis C Supportive care.  Follow-up with ID.   Patient is stable.  Seizure-free.  Okay for discharge home today.   PERTINENT LABS:  The results of significant diagnostics from this hospitalization (including imaging, microbiology, ancillary and laboratory) are listed below for reference.     Labs:   Basic Metabolic Panel: Recent Labs  Lab 12/24/21 1750 12/25/21 0854 12/26/21 1050  NA 136 141 138  K 5.3* 3.7 3.9  CL 106 108 109  CO2 21* 25 23  GLUCOSE 104* 114* 112*  BUN 18 13 11   CREATININE 1.08 1.01 0.95  CALCIUM 8.0* 9.3 8.7*   Liver Function Tests: Recent Labs  Lab 12/24/21 1750 12/25/21 0854  AST 90* 54*  ALT 48* 45*  ALKPHOS 60 66  BILITOT 3.1* 3.0*  PROT 6.5 7.1  ALBUMIN 3.6 3.9    CBC: Recent Labs  Lab 12/24/21 1801 12/25/21 0854 12/26/21 1050  WBC 9.7 6.1 3.2*  NEUTROABS 8.1*  --   --   HGB 13.7 15.0 15.0  HCT 41.6 45.1 46.6  MCV 101.0* 98.9 102.6*  PLT 112* 155 147*      IMAGING STUDIES CT Head Wo Contrast  Result Date: 12/24/2021 CLINICAL DATA:  Dizziness EXAM: CT HEAD WITHOUT CONTRAST TECHNIQUE: Contiguous axial images were obtained from the base of the skull through the vertex without intravenous contrast. RADIATION DOSE REDUCTION: This exam was performed according to the departmental dose-optimization program which includes automated exposure control, adjustment of the mA and/or kV according to patient size and/or use of iterative  reconstruction technique. COMPARISON:  12/10/2021 FINDINGS: Brain: No evidence of acute infarction, hemorrhage, hydrocephalus, extra-axial collection or mass lesion/mass effect. Vascular: No hyperdense vessel or unexpected calcification. Skull: Normal. Negative for fracture or focal lesion. Sinuses/Orbits: No acute finding. Other: None. IMPRESSION: No acute intracranial abnormality noted. Electronically Signed   By: Alcide Clever M.D.   On: 12/24/2021  19:30   DG Chest Port 1 View  Result Date: 12/24/2021 CLINICAL DATA:  Shortness of breath.  Seizures. EXAM: PORTABLE CHEST 1 VIEW COMPARISON:  February 17, 2021 FINDINGS: The heart size and mediastinal contours are within normal limits. Both lungs are clear. The visualized skeletal structures are unremarkable. IMPRESSION: No active disease. Electronically Signed   By: Gerome Sam III M.D.   On: 12/24/2021 18:03   CT Head Wo Contrast  Result Date: 12/10/2021 CLINICAL DATA:  Seizure disorder, clinical change, witnessed seizure, did not take Keppra dose last night, HIV, hepatitis C EXAM: CT HEAD WITHOUT CONTRAST TECHNIQUE: Contiguous axial images were obtained from the base of the skull through the vertex without intravenous contrast. RADIATION DOSE REDUCTION: This exam was performed according to the departmental dose-optimization program which includes automated exposure control, adjustment of the mA and/or kV according to patient size and/or use of iterative reconstruction technique. COMPARISON:  11/22/2021 head CT FINDINGS: Brain: No evidence of parenchymal hemorrhage or extra-axial fluid collection. No mass lesion, mass effect, or midline shift. No CT evidence of acute infarction. Cerebral volume is age appropriate. No ventriculomegaly. Vascular: No acute abnormality. Skull: No evidence of calvarial fracture. Sinuses/Orbits: The visualized paranasal sinuses are essentially clear. Other:  The mastoid air cells are unopacified. IMPRESSION: Negative head CT. No evidence of acute intracranial abnormality. Electronically Signed   By: Delbert Phenix M.D.   On: 12/10/2021 08:04    DISCHARGE EXAMINATION: Vitals:   12/25/21 1830 12/25/21 2230 12/26/21 0230 12/26/21 1322  BP: 110/71 110/65 101/73 (!) 105/58  Pulse:  70 65 68  Resp: 20 20 18 20   Temp: 97.9 F (36.6 C) 99 F (37.2 C) 98.4 F (36.9 C) 98.2 F (36.8 C)  TempSrc: Oral Oral Oral   SpO2: 98% 97% 98% 100%  Weight:      Height:       General  appearance: Awake alert.  In no distress Resp: Clear to auscultation bilaterally.  Normal effort Cardio: S1-S2 is normal regular.  No S3-S4.  No rubs murmurs or bruit GI: Abdomen is soft.  Nontender nondistended.  Bowel sounds are present normal.  No masses organomegaly   DISPOSITION: Home  Discharge Instructions     Call MD for:  difficulty breathing, headache or visual disturbances   Complete by: As directed    Call MD for:  extreme fatigue   Complete by: As directed    Call MD for:  persistant dizziness or light-headedness   Complete by: As directed    Call MD for:  persistant nausea and vomiting   Complete by: As directed    Call MD for:  severe uncontrolled pain   Complete by: As directed    Call MD for:  temperature >100.4   Complete by: As directed    Diet - low sodium heart healthy   Complete by: As directed    Discharge instructions   Complete by: As directed    Please be sure to follow-up with your primary care provider.  Take your medications as prescribed.  Do not miss taking your seizure medicines.  You were cared for by a hospitalist during  your hospital stay. If you have any questions about your discharge medications or the care you received while you were in the hospital after you are discharged, you can call the unit and asked to speak with the hospitalist on call if the hospitalist that took care of you is not available. Once you are discharged, your primary care physician will handle any further medical issues. Please note that NO REFILLS for any discharge medications will be authorized once you are discharged, as it is imperative that you return to your primary care physician (or establish a relationship with a primary care physician if you do not have one) for your aftercare needs so that they can reassess your need for medications and monitor your lab values. If you do not have a primary care physician, you can call [phone] for a physician referral.   Increase  activity slowly   Complete by: As directed           Allergies as of 12/26/2021   No Known Allergies      Medication List     STOP taking these medications    dicyclomine 20 MG tablet Commonly known as: BENTYL   ondansetron 4 MG disintegrating tablet Commonly known as: ZOFRAN-ODT       TAKE these medications    Biktarvy 50-200-25 MG Tabs tablet Generic drug: bictegravir-emtricitabine-tenofovir AF Take 1 tablet by mouth daily. What changed: Another medication with the same name was removed. Continue taking this medication, and follow the directions you see here.   citalopram 10 MG tablet Commonly known as: CELEXA Take 1 tablet (10 mg total) by mouth daily.   levETIRAcetam 250 MG tablet Commonly known as: KEPPRA Take 5 tablets (1,250 mg total) by mouth 2 (two) times daily.   OLANZapine zydis 5 MG disintegrating tablet Commonly known as: ZYPREXA Take 1 tablet (5 mg total) by mouth at bedtime.           TOTAL DISCHARGE TIME: 35 minutes  Angeleigh Chiasson [employer] on [email or web address]  12/27/2021, 11:18 AM

## 2021-12-26 NOTE — TOC Transition Note (Signed)
Transition of Care [facility]) - CM/SW Discharge Note   Patient Details  Name: SAN MANSBERGER MRN: [id number] Date of Birth: 08/20/1980  [facility] of [facility]) CM/SW Contact:  Darleene Cleaver, LCSW Phone Number: 12/26/2021, 11:41 AM   Clinical Narrative:     CSW was informed that patient wanted substance abuse resources.  CSW added resources to patient's AVS.  CSW signing off please reconsult if other social work needs arise.   Final next level of care: Home/Self Care Barriers to Discharge: Barriers Resolved   Patient Goals and CMS Choice Patient states their goals for this hospitalization and ongoing recovery are:: To return to his hotel      Discharge Placement                       Discharge Plan and Services                                     Social Determinants of Health (SDOH) Interventions     Readmission Risk Interventions     No data to display

## 2021-12-27 LAB — LEVETIRACETAM LEVEL: Levetiracetam Lvl: 32.6 ug/mL (ref 10.0–40.0)

## 2021-12-30 ENCOUNTER — Other Ambulatory Visit: Payer: Self-pay

## 2021-12-30 ENCOUNTER — Encounter (HOSPITAL_COMMUNITY): Payer: Self-pay | Admitting: Emergency Medicine

## 2021-12-30 ENCOUNTER — Emergency Department (HOSPITAL_COMMUNITY)
Admission: EM | Admit: 2021-12-30 | Discharge: 2021-12-30 | Disposition: A | Discharge status: Home / Self Care | Payer: Self-pay | Attending: Emergency Medicine | Admitting: Emergency Medicine

## 2021-12-30 DIAGNOSIS — F309 Manic episode, unspecified: Secondary | ICD-10-CM | POA: Insufficient documentation

## 2021-12-30 DIAGNOSIS — F3010 Manic episode without psychotic symptoms, unspecified: Secondary | ICD-10-CM

## 2021-12-30 NOTE — ED Triage Notes (Signed)
Patient brought in by [facility] coming from [employer]. Patient was found naked in [employer] bathroom. Patient was manic in bathroom however cooperative with EMS. 5mg  haldol IM given PTA.   EMS vitals 122/66, HR 68, 100% on RA. 20 RR CBG 102

## 2021-12-30 NOTE — ED Provider Notes (Signed)
[facility]-EMERGENCY DEPT Provider Note   CSN: [id number] Arrival date & time: 12/30/21  1444     History  Chief Complaint  Patient presents with   Manic Behavior    Justin [name] is a 41 y.o. male.  41 year old male well-known to me presents after having possible mania at [employer].  Patient states that he has not been using any methamphetamines.  Patient was found naked in [employer].  Was given Haldol and feels better at this time.  Recent hospitalization for seizures and states he has been compliant with his Keppra.  Denies any seizure today.  No SI or HI.  States he feels fine at this time.       Home Medications Prior to Admission medications   Medication Sig Start Date End Date Taking? Authorizing Provider  bictegravir-emtricitabine-tenofovir AF (BIKTARVY) 50-200-25 MG TABS tablet Take 1 tablet by mouth daily. 12/08/21 01/07/22  Earney Navy, NP  citalopram (CELEXA) 10 MG tablet Take 1 tablet (10 mg total) by mouth daily. Patient not taking: Reported on 12/25/2021 12/08/21 01/07/22  Earney Navy, NP  levETIRAcetam (KEPPRA) 250 MG tablet Take 5 tablets (1,250 mg total) by mouth 2 (two) times daily. 12/26/21 03/26/22  Osvaldo Shipper, MD  OLANZapine zydis (ZYPREXA) 5 MG disintegrating tablet Take 1 tablet (5 mg total) by mouth at bedtime. Patient not taking: Reported on 12/25/2021 12/07/21 01/06/22  Earney Navy, NP      Allergies    Patient has no known allergies.    Review of Systems   Review of Systems  All other systems reviewed and are negative.   Physical Exam Updated Vital Signs BP (!) 129/91   Pulse (!) 58   Temp 98 F (36.7 C) (Oral)   Resp 10   Ht 1.816 m (5' 11.5")   Wt 88.9 kg   SpO2 100%   BMI 26.96 kg/m  Physical Exam Vitals and nursing note reviewed.  Constitutional:      General: He is not in acute distress.    Appearance: Normal appearance. He is well-developed. He is not toxic-appearing.  HENT:      Head: Normocephalic and atraumatic.  Eyes:     General: Lids are normal.     Conjunctiva/sclera: Conjunctivae normal.     Pupils: Pupils are equal, round, and reactive to light.  Neck:     Thyroid: No thyroid mass.     Trachea: No tracheal deviation.  Cardiovascular:     Rate and Rhythm: Normal rate and regular rhythm.     Heart sounds: Normal heart sounds. No murmur heard.    No gallop.  Pulmonary:     Effort: Pulmonary effort is normal. No respiratory distress.     Breath sounds: Normal breath sounds. No stridor. No decreased breath sounds, wheezing, rhonchi or rales.  Abdominal:     General: There is no distension.     Palpations: Abdomen is soft.     Tenderness: There is no abdominal tenderness. There is no rebound.  Musculoskeletal:        General: No tenderness. Normal range of motion.     Cervical back: Normal range of motion and neck supple.  Skin:    General: Skin is warm and dry.     Findings: No abrasion or rash.  Neurological:     Mental Status: He is alert and oriented to person, place, and time. Mental status is at baseline.     GCS: GCS eye subscore is  4. GCS verbal subscore is 5. GCS motor subscore is 6.     Cranial Nerves: No cranial nerve deficit.     Sensory: No sensory deficit.     Motor: Motor function is intact.  Psychiatric:        Attention and Perception: Attention normal.        Speech: Speech normal.        Behavior: Behavior normal.        Thought Content: Thought content does not include homicidal or suicidal ideation. Thought content does not include homicidal or suicidal plan.     ED Results / Procedures / Treatments   Labs (all labs ordered are listed, but only abnormal results are displayed) Labs Reviewed - No data to display  EKG None  Radiology No results found.  Procedures Procedures    Medications Ordered in ED Medications - No data to display  ED Course/ Medical Decision Making/ A&P                           Medical  Decision Making  Patient alert and oriented x4 at this time.  He does not appear to be psychotic at this time.  He is cooperative and calm.  States he became warm which is why he took his clothes off in [employer].  Do not feel that patient needs psychiatric evaluation at this time.  Plan will be for patient to be monitored here for few hours and then discharged        Final Clinical Impression(s) / ED Diagnoses Final diagnoses:  None    Rx / DC Orders ED Discharge Orders     None         [name], MD 12/30/21 1520

## 2021-12-31 ENCOUNTER — Ambulatory Visit (INDEPENDENT_AMBULATORY_CARE_PROVIDER_SITE_OTHER): Payer: Self-pay | Admitting: Internal Medicine

## 2021-12-31 ENCOUNTER — Encounter: Payer: Self-pay | Admitting: Internal Medicine

## 2021-12-31 ENCOUNTER — Other Ambulatory Visit: Payer: Self-pay

## 2021-12-31 VITALS — BP 124/82 | HR 79 | Temp 98.3°F | Wt 195.0 lb

## 2021-12-31 DIAGNOSIS — B20 Human immunodeficiency virus [HIV] disease: Secondary | ICD-10-CM

## 2021-12-31 DIAGNOSIS — F323 Major depressive disorder, single episode, severe with psychotic features: Secondary | ICD-10-CM

## 2021-12-31 NOTE — Progress Notes (Signed)
   Subjective:    Patient ID: Justin [name], male    DOB: [birth year], 41 y.o.   MRN: [id number]  HPI He is here for follow up of HIV He is on Biktarvy and he reports good compliance with no missed doses.  He has generally been suppressed on and off medications c/w an elite controller.  Recent CD4 good at 514 and viral load suppressed.  He was in the ED yesterday [place].  He tells me that he was hearing voices telling him to take his clothes off or people around him will be hurt.  He relates the story with the understanding that they are voices and not real but very clear to him.  He describes hearing a lot of voices.  He has been unable to get into consistent mental health care.  He has had positive drug screens x 2 this month during hospital visits.  He was hospitalized for presumed seizure-like activity.  He reported then that he had been off his medications.     Review of Systems  Constitutional:  Negative for fatigue.  Gastrointestinal:  Negative for diarrhea and nausea.  Skin:  Negative for rash.       Objective:   Physical Exam Eyes:     General: No scleral icterus. Pulmonary:     Effort: Pulmonary effort is normal.  Neurological:     General: No focal deficit present.     Mental Status: He is alert.  Psychiatric:     Comments: He has a little bit of pressured speech but redirectable.  Some delusional reports.     SH: remains homeless        Assessment & Plan:

## 2021-12-31 NOTE — Assessment & Plan Note (Signed)
He continues to struggle with mental illness and hard to get him in care since his biggest issue is homelessness and food insecurity.  Food provided today.  He is now working with [facility].

## 2021-12-31 NOTE — Assessment & Plan Note (Addendum)
He is doing well from this standpoint and is compliant by report but likely an Elite Controller anyway.  He is interested in [place], particularly with his homelessness and risk of loosing medications and since he has been compliant with his medications, I think it is a good option for him.  I will have him continue with Biktarvy now and follow up with pharmacy in 1 month to consider it.  I think if he shows up then, it is ok to start him at a subsequent visit on [place].    I have personally spent 45 minutes involved in face-to-face and non-face-to-face activities for this patient on the day of the visit. Professional time spent includes the following activities: Preparing to see the patient (review of tests), Obtaining and/or reviewing separately obtained history (admission/discharge record), Performing a medically appropriate examination and/or evaluation , Ordering medications/tests/procedures, referring and communicating with other health care professionals, Documenting clinical information in the EMR, Independently interpreting results (not separately reported), Communicating results to the patient/family/caregiver, Counseling and educating the patient/family/caregiver and Care coordination (not separately reported).

## 2022-01-13 ENCOUNTER — Emergency Department (HOSPITAL_COMMUNITY): Payer: Self-pay

## 2022-01-13 ENCOUNTER — Encounter (HOSPITAL_COMMUNITY): Payer: Self-pay | Admitting: Emergency Medicine

## 2022-01-13 ENCOUNTER — Emergency Department (HOSPITAL_COMMUNITY)
Admission: EM | Admit: 2022-01-13 | Discharge: 2022-01-13 | Disposition: A | Discharge status: Home / Self Care | Payer: Self-pay | Attending: Emergency Medicine | Admitting: Emergency Medicine

## 2022-01-13 ENCOUNTER — Other Ambulatory Visit: Payer: Self-pay

## 2022-01-13 DIAGNOSIS — M542 Cervicalgia: Secondary | ICD-10-CM | POA: Insufficient documentation

## 2022-01-13 DIAGNOSIS — R519 Headache, unspecified: Secondary | ICD-10-CM | POA: Insufficient documentation

## 2022-01-13 DIAGNOSIS — M545 Low back pain, unspecified: Secondary | ICD-10-CM | POA: Insufficient documentation

## 2022-01-13 DIAGNOSIS — Z21 Asymptomatic human immunodeficiency virus [HIV] infection status: Secondary | ICD-10-CM | POA: Insufficient documentation

## 2022-01-13 DIAGNOSIS — R569 Unspecified convulsions: Secondary | ICD-10-CM | POA: Insufficient documentation

## 2022-01-13 LAB — URINALYSIS, ROUTINE W REFLEX MICROSCOPIC
Bilirubin Urine: NEGATIVE
Glucose, UA: NEGATIVE mg/dL
Hgb urine dipstick: NEGATIVE
Ketones, ur: NEGATIVE mg/dL
Leukocytes,Ua: NEGATIVE
Nitrite: NEGATIVE
Protein, ur: NEGATIVE mg/dL
Specific Gravity, Urine: 1.026 (ref 1.005–1.030)
pH: 5 (ref 5.0–8.0)

## 2022-01-13 LAB — RAPID URINE DRUG SCREEN, HOSP PERFORMED
Amphetamines: POSITIVE — AB
Barbiturates: NOT DETECTED
Benzodiazepines: NOT DETECTED
Cocaine: NOT DETECTED
Opiates: NOT DETECTED
Tetrahydrocannabinol: NOT DETECTED

## 2022-01-13 LAB — BASIC METABOLIC PANEL
Anion gap: 14 (ref 5–15)
BUN: 19 mg/dL (ref 6–20)
CO2: 25 mmol/L (ref 22–32)
Calcium: 9.3 mg/dL (ref 8.9–10.3)
Chloride: 103 mmol/L (ref 98–111)
Creatinine, Ser: 1.02 mg/dL (ref 0.61–1.24)
GFR, Estimated: >60 mL/min (ref >60)
Glucose, Bld: 97 mg/dL (ref 70–99)
Potassium: 3.5 mmol/L (ref 3.5–5.1)
Sodium: 142 mmol/L (ref 135–145)

## 2022-01-13 LAB — CBC WITH DIFFERENTIAL/PLATELET
Abs Immature Granulocytes: 0.01 10*3/uL (ref 0.00–0.07)
Basophils Absolute: 0 10*3/uL (ref 0.0–0.1)
Basophils Relative: 1 %
Eosinophils Absolute: 0.2 10*3/uL (ref 0.0–0.5)
Eosinophils Relative: 3 %
HCT: 45.8 % (ref 39.0–52.0)
Hemoglobin: 15.5 g/dL (ref 13.0–17.0)
Immature Granulocytes: 0 %
Lymphocytes Relative: 32 %
Lymphs Abs: 1.9 10*3/uL (ref 0.7–4.0)
MCH: 32.6 pg (ref 26.0–34.0)
MCHC: 33.8 g/dL (ref 30.0–36.0)
MCV: 96.2 fL (ref 80.0–100.0)
Monocytes Absolute: 0.6 10*3/uL (ref 0.1–1.0)
Monocytes Relative: 11 %
Neutro Abs: 3.2 10*3/uL (ref 1.7–7.7)
Neutrophils Relative %: 53 %
Platelets: 245 10*3/uL (ref 150–400)
RBC: 4.76 MIL/uL (ref 4.22–5.81)
RDW: 11.9 % (ref 11.5–15.5)
WBC: 5.9 10*3/uL (ref 4.0–10.5)
nRBC: 0 % (ref 0.0–0.2)

## 2022-01-13 LAB — CBG MONITORING, ED: Glucose-Capillary: 88 mg/dL (ref 70–99)

## 2022-01-13 MED ORDER — BICTEGRAVIR-EMTRICITAB-TENOFOV 50-200-25 MG PO TABS: 1.0000 | ORAL_TABLET | Freq: Every day | ORAL | 0 refills | Status: DC

## 2022-01-13 MED ORDER — SODIUM CHLORIDE 0.9 % IV BOLUS
1000.0000 mL | Freq: Once | INTRAVENOUS | Status: AC
  Administered 2022-01-13: 1000 mL via INTRAVENOUS

## 2022-01-13 MED ORDER — LEVETIRACETAM 250 MG PO TABS: 1250.0000 mg | ORAL_TABLET | Freq: Two times a day (BID) | ORAL | 0 refills | Status: DC

## 2022-01-13 MED ORDER — LEVETIRACETAM IN NACL 1500 MG/100ML IV SOLN
1500.0000 mg | Freq: Once | INTRAVENOUS | Status: AC
  Administered 2022-01-13: 1500 mg via INTRAVENOUS
  Filled 2022-01-13: qty 100

## 2022-01-13 MED ORDER — BICTEGRAVIR-EMTRICITAB-TENOFOV 50-200-25 MG PO TABS
1.0000 | ORAL_TABLET | Freq: Once | ORAL | Status: AC
  Administered 2022-01-13: 1 via ORAL
  Filled 2022-01-13 (×2): qty 1

## 2022-01-13 NOTE — Discharge Instructions (Signed)
Your Keppra and Biktarvy have been prescribed and sent to the pharmacy for you today.  Please make sure you pick up these medications and continue taking them as prescribed.  Follow-up with infectious disease center as planned.  Return for new or worsening symptoms.

## 2022-01-13 NOTE — ED Provider Triage Note (Addendum)
Emergency Medicine Provider Triage Evaluation Note  LORON WEIMER , a 41 y.o. male  was evaluated in triage.  Pt complains of seizure.  Happened earlier today, patient states she has been taking his Keppra.  He denies any illicit drug use.  Unsure how long it lasted, states he has pain all over his body.  Denies any prodromal symptoms, fevers, coughs..  Review of Systems  Per HPI  Physical Exam  BP 117/72 (BP Location: Right Arm)   Pulse 88   Temp 97.9 F (36.6 C) (Oral)   Resp 18   SpO2 98%  Gen:   Awake, no distress   Resp:  Normal effort  MSK:   Moves extremities without difficulty  Other:  Patient ox3. No abrasion to tongue. No scalp laceration. Agitated affect  Medical Decision Making  Medically screening exam initiated at 3:16 AM.  Appropriate orders placed.  Hessie Knows Wands was informed that the remainder of the evaluation will be completed by another provider, this initial triage assessment does not replace that evaluation, and the importance of remaining in the ED until their evaluation is complete.  Patient states she has been medically compliant we will check some basic labs and UA.  Patient has history of polysubstance use disorder.    Theron Arista, PA-C 01/13/22 0317    Theron Arista, PA-C 01/13/22 [phone]

## 2022-01-13 NOTE — ED Notes (Addendum)
When updating the patient's vitals, the patient was very difficult to arouse. This EMT loudly called the patient's name with no response. This EMT also pinched the patient's hand as well as sternal rubbed the patient still with no response. This EMT noted that the patient had respirations as well as a pulse. Another EMT assisted this EMT in finally arousing the patient, to which to patient woke up aggressively. The patient then refused vital signs to be performed forcefully placing his blood pressure cuff on the [employer]om[employer] cart. Shortly after this event, this EMT attempted to collect the patient to take him to his room. Again being hard to arouse, the patient stood up aggressively throwing his blankets and using profanity. After putting his shoes on, the patient walked unsteadily to his room, asking for a sandwich. This EMT stated that he would have to wait to be seen. The patient became increasingly more agitated, urging that he wanted a sandwich. When the patient arrived to his room, the patient refused to put on a gown or to sit down. He then went out of the room and stole a banana of the hallway patient's breakfast tray. Staff told him he could not take other patient's food. He then continued back out into the hall and take the food box off the patient's tray. Security was involved. The PA student, the RN and Charge RN was notified of the situation.

## 2022-01-13 NOTE — ED Provider Notes (Signed)
[name] EMERGENCY DEPARTMENT Provider Note   CSN: [id number] Arrival date & time: 01/13/22  0303     History  Chief Complaint  Patient presents with   Seizures    Justin [name] is a 41 y.o. male.   Justin [name] is a 41 y.o. male with PMHx significant for HIV, Sz disorder, and mood disorder now presents to the ED for suspected seizure activity yesterday. Per patient, event was unwitnessed, unknown down time, no tongue bite, unknown head trauma. Pt woke up on the side of the road, amnestic to how he got there. He currently complains of mild head, neck and back pain. Patient reports he has not taken his Keppra in a couple of days, though has remained compliant with current HIV regiment, aside from today.  Patient reports he is homeless and is unsure what happened to his medications.  No other recent seizure activity, no recent illness, or other acute concerns.   The history is provided by the patient and medical records.       Home Medications Prior to Admission medications   Medication Sig Start Date End Date Taking? Authorizing Provider  citalopram (CELEXA) 10 MG tablet Take 1 tablet (10 mg total) by mouth daily. Patient not taking: Reported on 12/25/2021 12/08/21 01/07/22  Earney Navy, NP  levETIRAcetam (KEPPRA) 250 MG tablet Take 5 tablets (1,250 mg total) by mouth 2 (two) times daily. 12/26/21 03/26/22  Osvaldo Shipper, MD  OLANZapine zydis (ZYPREXA) 5 MG disintegrating tablet Take 1 tablet (5 mg total) by mouth at bedtime. Patient not taking: Reported on 12/25/2021 12/07/21 01/06/22  Earney Navy, NP      Allergies    Patient has no known allergies.    Review of Systems   Review of Systems  Neurological:  Positive for seizures and headaches.    Physical Exam Updated Vital Signs BP 115/75   Pulse 70   Temp 97.8 F (36.6 C) (Oral)   Resp 14   Ht 5\' 11"  (1.803 m)   Wt 89.8 kg   SpO2 100%   BMI 27.62 kg/m  Physical Exam Vitals  and nursing note reviewed.  Constitutional:      General: He is not in acute distress.    Appearance: Normal appearance. He is well-developed. He is not ill-appearing or diaphoretic.     Comments: Well-appearing and in no distress   HENT:     Head: Normocephalic and atraumatic.     Mouth/Throat:     Mouth: Mucous membranes are moist.     Pharynx: Oropharynx is clear.     Comments: No tongue bite Eyes:     General:        Right eye: No discharge.        Left eye: No discharge.     Extraocular Movements: Extraocular movements intact.     Pupils: Pupils are equal, round, and reactive to light.  Neck:     Comments: Mild c-spine tenderness, no step off Cardiovascular:     Rate and Rhythm: Normal rate and regular rhythm.     Pulses: Normal pulses.     Heart sounds: Normal heart sounds.  Pulmonary:     Effort: Pulmonary effort is normal. No respiratory distress.     Breath sounds: Normal breath sounds. No wheezing or rales.     Comments: Respirations equal and unlabored, patient able to speak in full sentences, lungs clear to auscultation bilaterally  Abdominal:     General: Bowel  sounds are normal. There is no distension.     Palpations: Abdomen is soft. There is no mass.     Tenderness: There is no abdominal tenderness. There is no guarding.     Comments: Abdomen soft, nondistended, nontender to palpation in all quadrants without guarding or peritoneal signs  Musculoskeletal:        General: No deformity.     Cervical back: Neck supple.     Comments: No midline thoracic or lumbar spine tenderness.  Mild tenderness over the low back musculature bilaterally, no overlying skin changes. all joints supple and easily movable, all compartments soft  Skin:    General: Skin is warm and dry.     Capillary Refill: Capillary refill takes less than 2 seconds.  Neurological:     Mental Status: He is alert and oriented to person, place, and time.     Coordination: Coordination normal.      Comments: Speech is clear, able to follow commands CN III-XII intact Normal strength in upper and lower extremities bilaterally including dorsiflexion and plantar flexion, strong and equal grip strength Sensation normal to light and sharp touch Moves extremities without ataxia, coordination intact  Psychiatric:        Mood and Affect: Mood normal.        Behavior: Behavior normal.     ED Results / Procedures / Treatments   Labs (all labs ordered are listed, but only abnormal results are displayed) Labs Reviewed  RAPID URINE DRUG SCREEN, HOSP PERFORMED - Abnormal; Notable for the following components:      Result Value   Amphetamines POSITIVE (*)    All other components within normal limits  CBC WITH DIFFERENTIAL/PLATELET  BASIC METABOLIC PANEL  URINALYSIS, ROUTINE W REFLEX MICROSCOPIC  CBG MONITORING, ED    EKG EKG Interpretation  Date/Time:  Thursday January 13 2022 03:13:16 EDT Ventricular Rate:  80 PR Interval:  136 QRS Duration: 90 QT Interval:  388 QTC Calculation: 447 R Axis:   -18 Text Interpretation: Normal sinus rhythm Normal ECG When compared with ECG of 30-Dec-2021 15:02, No significant change was found Confirmed by Dione Booze ([id number]) on 01/13/2022 4:43:29 AM  Radiology CT Cervical Spine Wo Contrast  Result Date: 01/13/2022 CLINICAL DATA:  Neck trauma, midline tenderness EXAM: CT CERVICAL SPINE WITHOUT CONTRAST TECHNIQUE: Multidetector CT imaging of the cervical spine was performed without intravenous contrast. Multiplanar CT image reconstructions were also generated. RADIATION DOSE REDUCTION: This exam was performed according to the departmental dose-optimization program which includes automated exposure control, adjustment of the mA and/or kV according to patient size and/or use of iterative reconstruction technique. COMPARISON:  11/22/2021 FINDINGS: Alignment: Similar straightened alignment. No subluxation or dislocation. Facets are aligned. Skull base and  vertebrae: No acute fracture. No primary bone lesion or focal pathologic process. Soft tissues and spinal canal: No prevertebral fluid or swelling. No visible canal hematoma. Disc levels: Moderate degenerative disc disease most pronounced at C5-6 with similar endplate irregularity, sclerosis and bony spurring. Degenerative changes of the C1-2 articulation as well. Facets are aligned. No subluxation or dislocation. Upper chest: Apical paraseptal emphysema changes.  No acute finding. Other: None. IMPRESSION: Similar cervical degenerative changes as above. No acute osseous finding, fracture, malalignment by CT. Electronically Signed   By: Judie Petit.  Shick M.D.   On: 01/13/2022 10:48   CT HEAD WO CONTRAST ( )  Result Date: 01/13/2022 CLINICAL DATA:  Head trauma EXAM: CT HEAD WITHOUT CONTRAST TECHNIQUE: Contiguous axial images were obtained from the base of the  skull through the vertex without intravenous contrast. RADIATION DOSE REDUCTION: This exam was performed according to the departmental dose-optimization program which includes automated exposure control, adjustment of the mA and/or kV according to patient size and/or use of iterative reconstruction technique. COMPARISON:  12/24/2021, 11/22/2021 FINDINGS: Brain: No evidence of acute infarction, hemorrhage, hydrocephalus, extra-axial collection or mass lesion/mass effect. Vascular: No hyperdense vessel or unexpected calcification. Skull: Normal. Negative for fracture or focal lesion. Sinuses/Orbits: Minor maxillary sinus mucosal thickening. Other sinuses clear. No sinus air-fluid level. Mastoids are clear. No orbital abnormality or asymmetry. Other: None. IMPRESSION: No acute intracranial abnormality by noncontrast CT. Minor maxillary sinus disease. Electronically Signed   By: Judie Petit.  Shick M.D.   On: 01/13/2022 10:44    Procedures Procedures    Medications Ordered in ED Medications  levETIRAcetam (KEPPRA) IVPB 1500 mg/ 100 mL premix (0 mg Intravenous Stopped  01/13/22 0917)  sodium chloride 0.9 % bolus 1,000 mL (0 mLs Intravenous Stopped 01/13/22 1125)  bictegravir-emtricitabine-tenofovir AF (BIKTARVY) 50-200-25 MG per tablet 1 tablet (1 tablet Oral Given 01/13/22 1128)    ED Course/ Medical Decision Making/ A&P                           Medical Decision Making Amount and/or Complexity of Data Reviewed Radiology: ordered.  Risk Prescription drug management.   41 y.o. male presents to the ED with complaints of suspected seizure, this involves an extensive number of treatment options, and is a complaint that carries with it a high risk of complications and morbidity.  The differential diagnosis includes seizure, syncope, substance use, electrolyte derangement, head injury  On arrival pt is nontoxic, vitals WNL. Exam significant for no neurologic deficits, patient is alert, calm and cooperative  Additional history obtained from chart review. Previous records obtained and reviewed recent outpatient follow-up  I ordered medications including Keppra load, IV fluids and patient's dose of Biktarvy for today  Lab Tests:  I Ordered, reviewed, and interpreted labs, which included: No leukocytosis, normal hemoglobin, no significant electrolyte derangements, normal renal function, UDS positive for amphetamines, UA negative.  Imaging Studies ordered:  I ordered imaging studies which included CT head and cervical spine, I independently visualized and interpreted imaging which showed no acute intracranial bleeding or skull fracture, no C-spine fracture  ED Course:   Patient received IV Keppra loading dose and has had no further seizure activity.  If patient did have a seizure prior to arrival this was likely in the setting of medication noncompliance.  I have sent in refills of his Keppra and HIV medications to the pharmacy.  Patient has returned to baseline with no focal neurologic deficits and no additional seizure activity.  At this time he is stable for  discharge home.  At this time there does not appear to be any evidence of an acute emergency medical condition requiring further emergent evaluation and the patient appears stable for discharge with appropriate outpatient follow up. Diagnosis and return precautions discussed with patient who verbalizes understanding and is agreeable to discharge.    Portions of this note were generated with Scientist, clinical (histocompatibility and immunogenetics). Dictation errors may occur despite best attempts at proofreading.         Final Clinical Impression(s) / ED Diagnoses Final diagnoses:  Seizure-like activity (HCC)    Rx / DC Orders ED Discharge Orders          Ordered    levETIRAcetam (KEPPRA) 250 MG tablet  2 times daily  01/13/22 1201    bictegravir-emtricitabine-tenofovir AF (BIKTARVY) 50-200-25 MG TABS tablet  Daily        01/13/22 1201              Dartha Lodge, [place] 01/13/22 1611    Milagros Loll, MD 01/14/22 [phone]

## 2022-01-13 NOTE — ED Triage Notes (Signed)
Pt presented to ED with c/o o seixure earlier in the day. Is ambulatory at time of triage and displays agitated behavior with staff.

## 2022-01-14 ENCOUNTER — Encounter (HOSPITAL_COMMUNITY): Payer: Self-pay

## 2022-01-14 ENCOUNTER — Emergency Department (HOSPITAL_COMMUNITY)
Admission: EM | Admit: 2022-01-14 | Discharge: 2022-01-18 | Disposition: A | Discharge status: Home / Self Care | Payer: Self-pay | Attending: Emergency Medicine | Admitting: Emergency Medicine

## 2022-01-14 ENCOUNTER — Other Ambulatory Visit: Payer: Self-pay

## 2022-01-14 ENCOUNTER — Emergency Department (HOSPITAL_COMMUNITY)
Admission: EM | Admit: 2022-01-14 | Discharge: 2022-01-14 | Disposition: A | Discharge status: Home / Self Care | Payer: Self-pay | Attending: Emergency Medicine | Admitting: Emergency Medicine

## 2022-01-14 DIAGNOSIS — F323 Major depressive disorder, single episode, severe with psychotic features: Secondary | ICD-10-CM | POA: Diagnosis present

## 2022-01-14 DIAGNOSIS — Z79899 Other long term (current) drug therapy: Secondary | ICD-10-CM | POA: Insufficient documentation

## 2022-01-14 DIAGNOSIS — F1594 Other stimulant use, unspecified with stimulant-induced mood disorder: Secondary | ICD-10-CM | POA: Diagnosis present

## 2022-01-14 DIAGNOSIS — R569 Unspecified convulsions: Secondary | ICD-10-CM | POA: Insufficient documentation

## 2022-01-14 DIAGNOSIS — R451 Restlessness and agitation: Secondary | ICD-10-CM

## 2022-01-14 DIAGNOSIS — F1510 Other stimulant abuse, uncomplicated: Secondary | ICD-10-CM

## 2022-01-14 DIAGNOSIS — F1994 Other psychoactive substance use, unspecified with psychoactive substance-induced mood disorder: Secondary | ICD-10-CM

## 2022-01-14 DIAGNOSIS — F1514 Other stimulant abuse with stimulant-induced mood disorder: Secondary | ICD-10-CM | POA: Insufficient documentation

## 2022-01-14 DIAGNOSIS — Z21 Asymptomatic human immunodeficiency virus [HIV] infection status: Secondary | ICD-10-CM | POA: Insufficient documentation

## 2022-01-14 DIAGNOSIS — R45851 Suicidal ideations: Secondary | ICD-10-CM

## 2022-01-14 LAB — BASIC METABOLIC PANEL
Anion gap: 13 (ref 5–15)
BUN: 8 mg/dL (ref 6–20)
CO2: 24 mmol/L (ref 22–32)
Calcium: 8.9 mg/dL (ref 8.9–10.3)
Chloride: 102 mmol/L (ref 98–111)
Creatinine, Ser: 1.16 mg/dL (ref 0.61–1.24)
GFR, Estimated: >60 mL/min (ref >60)
Glucose, Bld: 100 mg/dL — ABNORMAL HIGH (ref 70–99)
Potassium: 3.8 mmol/L (ref 3.5–5.1)
Sodium: 139 mmol/L (ref 135–145)

## 2022-01-14 LAB — CBC WITH DIFFERENTIAL/PLATELET
Abs Immature Granulocytes: 0.01 10*3/uL (ref 0.00–0.07)
Basophils Absolute: 0 10*3/uL (ref 0.0–0.1)
Basophils Relative: 1 %
Eosinophils Absolute: 0.1 10*3/uL (ref 0.0–0.5)
Eosinophils Relative: 1 %
HCT: 45.5 % (ref 39.0–52.0)
Hemoglobin: 15.6 g/dL (ref 13.0–17.0)
Immature Granulocytes: 0 %
Lymphocytes Relative: 23 %
Lymphs Abs: 1.5 10*3/uL (ref 0.7–4.0)
MCH: 33.1 pg (ref 26.0–34.0)
MCHC: 34.3 g/dL (ref 30.0–36.0)
MCV: 96.6 fL (ref 80.0–100.0)
Monocytes Absolute: 0.6 10*3/uL (ref 0.1–1.0)
Monocytes Relative: 9 %
Neutro Abs: 4.2 10*3/uL (ref 1.7–7.7)
Neutrophils Relative %: 66 %
Platelets: 211 10*3/uL (ref 150–400)
RBC: 4.71 MIL/uL (ref 4.22–5.81)
RDW: 11.8 % (ref 11.5–15.5)
WBC: 6.3 10*3/uL (ref 4.0–10.5)
nRBC: 0 % (ref 0.0–0.2)

## 2022-01-14 LAB — COMPREHENSIVE METABOLIC PANEL
ALT: 23 U/L (ref 0–44)
AST: 22 U/L (ref 15–41)
Albumin: 3.9 g/dL (ref 3.5–5.0)
Alkaline Phosphatase: 72 U/L (ref 38–126)
Anion gap: 6 (ref 5–15)
BUN: 7 mg/dL (ref 6–20)
CO2: 28 mmol/L (ref 22–32)
Calcium: 8.8 mg/dL — ABNORMAL LOW (ref 8.9–10.3)
Chloride: 106 mmol/L (ref 98–111)
Creatinine, Ser: 0.88 mg/dL (ref 0.61–1.24)
GFR, Estimated: >60 mL/min (ref >60)
Glucose, Bld: 116 mg/dL — ABNORMAL HIGH (ref 70–99)
Potassium: 3.2 mmol/L — ABNORMAL LOW (ref 3.5–5.1)
Sodium: 140 mmol/L (ref 135–145)
Total Bilirubin: 1.3 mg/dL — ABNORMAL HIGH (ref 0.3–1.2)
Total Protein: 7 g/dL (ref 6.5–8.1)

## 2022-01-14 LAB — RAPID URINE DRUG SCREEN, HOSP PERFORMED
Amphetamines: NOT DETECTED
Barbiturates: NOT DETECTED
Benzodiazepines: NOT DETECTED
Cocaine: NOT DETECTED
Opiates: NOT DETECTED
Tetrahydrocannabinol: NOT DETECTED

## 2022-01-14 LAB — ETHANOL: Alcohol, Ethyl (B): <10 mg/dL (ref <10)

## 2022-01-14 LAB — ACETAMINOPHEN LEVEL: Acetaminophen (Tylenol), Serum: <10 ug/mL — ABNORMAL LOW (ref 10–30)

## 2022-01-14 LAB — SALICYLATE LEVEL: Salicylate Lvl: <7 mg/dL — ABNORMAL LOW (ref 7.0–30.0)

## 2022-01-14 MED ORDER — SODIUM CHLORIDE 0.9 % IV SOLN: 2000.0000 mg | Freq: Once | INTRAVENOUS | Status: DC

## 2022-01-14 MED ORDER — LEVETIRACETAM 500 MG PO TABS
1250.0000 mg | ORAL_TABLET | Freq: Two times a day (BID) | ORAL | Status: DC
  Administered 2022-01-15 – 2022-01-18 (×8): 1250 mg via ORAL
  Filled 2022-01-14 (×9): qty 1

## 2022-01-14 MED ORDER — LEVETIRACETAM IN NACL 1000 MG/100ML IV SOLN
1000.0000 mg | Freq: Once | INTRAVENOUS | Status: AC
  Administered 2022-01-14: 1000 mg via INTRAVENOUS
  Filled 2022-01-14: qty 100

## 2022-01-14 MED ORDER — OLANZAPINE 5 MG PO TBDP
5.0000 mg | ORAL_TABLET | Freq: Every day | ORAL | Status: DC
  Administered 2022-01-15 – 2022-01-17 (×4): 5 mg via ORAL
  Filled 2022-01-14 (×4): qty 1

## 2022-01-14 MED ORDER — CITALOPRAM HYDROBROMIDE 10 MG PO TABS
10.0000 mg | ORAL_TABLET | Freq: Every day | ORAL | Status: DC
  Administered 2022-01-15 (×2): 10 mg via ORAL
  Filled 2022-01-14 (×2): qty 1

## 2022-01-14 MED ORDER — BICTEGRAVIR-EMTRICITAB-TENOFOV 50-200-25 MG PO TABS
1.0000 | ORAL_TABLET | Freq: Every day | ORAL | Status: DC
  Administered 2022-01-15 – 2022-01-18 (×5): 1 via ORAL
  Filled 2022-01-14 (×5): qty 1
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
## 2022-01-14 NOTE — ED Notes (Signed)
While obtaining blood and securing an iv in pt, the pt makes several comments that "tonight will be the night" and "I am going out of this world." Pt also advises that he has a plan on how he is going to "do it." Pt is calm and relaxed now.

## 2022-01-14 NOTE — Discharge Instructions (Addendum)
It was our pleasure to provide your ER care today - we hope that you feel better.  Take your medications as prescribed.   Avoid drug use as it is harmful to your physical health and mental well-being.   No driving, operating machinery, swimming, or other potentially dangerous activity until cleared to do so by your doctor/neurologist.   Follow up with your doctor in the next 1-2 weeks.   Return to ER if worse, new symptoms, fevers, chest pain, trouble breathing, recurrent seizures, or other concern.

## 2022-01-14 NOTE — ED Notes (Signed)
Pt was talking to PA, started staring into space, lip smacking, stiffening and jerking for approximately 1 min. Pt held in wheelchair by staff. Pt able to be guided into standing while post-ictal and transferred onto a stretcher. Pt to room 23.

## 2022-01-14 NOTE — ED Notes (Signed)
Pt verbalized understanding of d/c instructions, meds, and followup care. Denies questions. VSS, no distress noted. Steady gait to exit with all belongings.  ?

## 2022-01-14 NOTE — ED Provider Notes (Signed)
[place] [facility] EMERGENCY DEPARTMENT Provider Note   CSN: [id number] Arrival date & time: 01/14/22  1203     History  Chief Complaint  Patient presents with   Seizures    Justin [name] is a 41 y.o. male.  HPI Presents after a witnessed seizure.  Patient has known seizure disorder, seen, evaluated here yesterday.  Patient had a seizure in triage, cannot provide any details of his own history.  No reported fall, trauma during that event.  Following triage seizure patient was brought to an exam room expeditiously, received loading dose of his antiepileptic.  Level 5 caveat secondary to acute of condition    Home Medications Prior to Admission medications   Medication Sig Start Date End Date Taking? Authorizing Provider  bictegravir-emtricitabine-tenofovir AF (BIKTARVY) 50-200-25 MG TABS tablet Take 1 tablet by mouth daily. 01/13/22   Jacqlyn Larsen, PA-C  citalopram (CELEXA) 10 MG tablet Take 1 tablet (10 mg total) by mouth daily. Patient not taking: Reported on 12/25/2021 12/08/21 01/07/22  Delfin Gant, NP  levETIRAcetam (KEPPRA) 250 MG tablet Take 5 tablets (1,250 mg total) by mouth 2 (two) times daily. 01/13/22 02/12/22  Jacqlyn Larsen, PA-C  OLANZapine zydis (ZYPREXA) 5 MG disintegrating tablet Take 1 tablet (5 mg total) by mouth at bedtime. Patient not taking: Reported on 12/25/2021 12/07/21 01/06/22  Delfin Gant, NP      Allergies    Patient has no known allergies.    Review of Systems   Review of Systems  Unable to perform ROS: Acuity of condition    Physical Exam Updated Vital Signs BP 137/87   Pulse 80   Temp 98.6 F (37 C) (Oral)   Resp 15   Ht 5\' 11"  (1.803 m)   Wt 86.2 kg   SpO2 100%   BMI 26.50 kg/m  Physical Exam Vitals and nursing note reviewed.  Constitutional:      General: He is not in acute distress.    Appearance: He is well-developed.     Comments: Postictal appearing adult male  HENT:     Head: Normocephalic and  atraumatic.  Eyes:     Conjunctiva/sclera: Conjunctivae normal.  Cardiovascular:     Rate and Rhythm: Normal rate and regular rhythm.  Pulmonary:     Effort: Pulmonary effort is normal. No respiratory distress.     Breath sounds: No stridor.  Abdominal:     General: There is no distension.  Skin:    General: Skin is warm and dry.  Neurological:     Mental Status: He is alert.     Comments: Moving all extremities minimally, spontaneously, not following commands  Psychiatric:     Comments: Withdrawn     ED Results / Procedures / Treatments   Labs (all labs ordered are listed, but only abnormal results are displayed) Labs Reviewed  BASIC METABOLIC PANEL - Abnormal; Notable for the following components:      Result Value   Glucose, Bld 100 (*)    All other components within normal limits    EKG EKG Interpretation  Date/Time:  Friday January 14 2022 12:27:02 EDT Ventricular Rate:  71 PR Interval:  145 QRS Duration: 111 QT Interval:  382 QTC Calculation: 416 R Axis:   -11 Text Interpretation: Sinus rhythm Probable anteroseptal infarct, old Borderline T abnormalities, inferior leads Artifact Confirmed by Carmin Muskrat [phone]) on 01/14/2022 1:44:32 PM  Radiology CT Cervical Spine Wo Contrast  Result Date: 01/13/2022 CLINICAL DATA:  Neck  trauma, midline tenderness EXAM: CT CERVICAL SPINE WITHOUT CONTRAST TECHNIQUE: Multidetector CT imaging of the cervical spine was performed without intravenous contrast. Multiplanar CT image reconstructions were also generated. RADIATION DOSE REDUCTION: This exam was performed according to the departmental dose-optimization program which includes automated exposure control, adjustment of the mA and/or kV according to patient size and/or use of iterative reconstruction technique. COMPARISON:  11/22/2021 FINDINGS: Alignment: Similar straightened alignment. No subluxation or dislocation. Facets are aligned. Skull base and vertebrae: No acute fracture.  No primary bone lesion or focal pathologic process. Soft tissues and spinal canal: No prevertebral fluid or swelling. No visible canal hematoma. Disc levels: Moderate degenerative disc disease most pronounced at C5-6 with similar endplate irregularity, sclerosis and bony spurring. Degenerative changes of the C1-2 articulation as well. Facets are aligned. No subluxation or dislocation. Upper chest: Apical paraseptal emphysema changes.  No acute finding. Other: None. IMPRESSION: Similar cervical degenerative changes as above. No acute osseous finding, fracture, malalignment by CT. Electronically Signed   By: Judie Petit.  Shick M.D.   On: 01/13/2022 10:48   CT HEAD WO CONTRAST ( )  Result Date: 01/13/2022 CLINICAL DATA:  Head trauma EXAM: CT HEAD WITHOUT CONTRAST TECHNIQUE: Contiguous axial images were obtained from the base of the skull through the vertex without intravenous contrast. RADIATION DOSE REDUCTION: This exam was performed according to the departmental dose-optimization program which includes automated exposure control, adjustment of the mA and/or kV according to patient size and/or use of iterative reconstruction technique. COMPARISON:  12/24/2021, 11/22/2021 FINDINGS: Brain: No evidence of acute infarction, hemorrhage, hydrocephalus, extra-axial collection or mass lesion/mass effect. Vascular: No hyperdense vessel or unexpected calcification. Skull: Normal. Negative for fracture or focal lesion. Sinuses/Orbits: Minor maxillary sinus mucosal thickening. Other sinuses clear. No sinus air-fluid level. Mastoids are clear. No orbital abnormality or asymmetry. Other: None. IMPRESSION: No acute intracranial abnormality by noncontrast CT. Minor maxillary sinus disease. Electronically Signed   By: Judie Petit.  Shick M.D.   On: 01/13/2022 10:44    Procedures Procedures    Medications Ordered in ED Medications  levETIRAcetam (KEPPRA) IVPB 1000 mg/100 mL premix (0 mg Intravenous Stopped 01/14/22 1345)    And   levETIRAcetam (KEPPRA) IVPB 1000 mg/100 mL premix (0 mg Intravenous Stopped 01/14/22 1345)    ED Course/ Medical Decision Making/ A&P This patient with a Hx of seizure disorder  presents to the ED for concern of witnessed seizure, this involves an extensive number of treatment options, and is a complaint that carries with it a high risk of complications and morbidity.    The differential diagnosis includes breakthrough seizure, electrolyte abnormalities, trauma   Social Determinants of Health:  Reported medication noncompliance  Additional history obtained:  Additional history and/or information obtained from chart review, notable for ED visits including yesterday, with imaging which I reviewed from yesterday, it was unremarkable   After the initial evaluation, orders, including: Labs bloating antiepileptics were initiated.   Patient placed on Cardiac and Pulse-Oximetry Monitors. The patient was maintained on a cardiac monitor.  The cardiac monitored showed an rhythm of 90 sinus normal The patient was also maintained on pulse oximetry. The readings were typically a percent room air normal   On repeat evaluation of the patient stayed the same  Lab Tests:  I personally interpreted labs.  The pertinent results include: Unremarkable chemistry panel  Imaging Studies ordered:  I independently visualized and interpreted imaging which showed CT scan from yesterday reviewed, unremarkable.  Patient has had 7 CT scan of his head this  calendar year. I agree with the radiologist interpretation   Dispostion / Final MDM:  After consideration of the diagnostic results and the patient's response to treatment, with known seizure disorder presents after a seizure.  Patient has had episodes of medication noncompliance in the past, there are some suspicion for this today, no evidence for trauma, no evidence for complication, patient received IV loading dose of his antiepileptics in the emergency  department.  Should the patient return to baseline he may be appropriate for discharge with outpatient follow-up.  Final Clinical Impression(s) / ED Diagnoses Final diagnoses:  Seizure [facility])    Rx / DC Orders ED Discharge Orders     None         Gerhard Munch, MD 01/14/22 1529

## 2022-01-14 NOTE — ED Provider Triage Note (Signed)
Emergency Medicine Provider Triage Evaluation Note  Justin [name] , a 41 y.o. male  was evaluated in triage.  Pt complains of seizure. EMS reports that patient had a 5-minute seizure, when they got there he was postictal with reported jerking and foaming at the mouth. Patient states that he has very vague memories of being at the hospital yesterday.  He states that he somewhat remembers trying to go get his prescription for Keppra and his antiretrovirals but says he was unable to get them and has not had any Keppra since he left the hospital.   While I was evaluating patient he began smacking his lips very loudly.  He was initially able to mumble in response however then went fully unresponsive.  He had loss of tone followed by body shaking.  He was lowered to the ground safely by myself and EMS without striking his head.  He did not vomit however did have drooling.  During this event his airway was protected.  He was unable to be helped onto a bed. Through frequent verbal reassurance he was able to remain calm.  Charge RN contacted by triage RN.  Patient was taken to the back immediately for second seizure.   I communicated directly with Dr. Jeraldine Loots, attending in the pod patient was taken to.  Note: Portions of this report may have been transcribed using voice recognition software. Every effort was made to ensure accuracy; however, inadvertent computerized transcription errors may be present       Cristina Gong, PA-C 01/14/22 1231

## 2022-01-14 NOTE — ED Triage Notes (Signed)
Pt to ED via [facility].  Pt was at [facility] and pt had a " " seizure. EMS states they were on scene in a minute and a half and was somewhat post-ictal.  Staff described seizure as upper body jerking and "foaming at mouth".  Pt c/o headache.    EMS VS 123/81 HR 72 97% RA Cbg 108

## 2022-01-14 NOTE — ED Provider Notes (Signed)
[facility]-EMERGENCY DEPT Provider Note   CSN: [id number] Arrival date & time: 01/14/22  1851     History  No chief complaint on file.   Justin [name] is a 41 y.o. male history of HIV, seizure on Keppra, bipolar, here presenting with suicidal ideation and hallucination.  Patient has been in the ED multiple times recently for seizure-like activity.  Most recently he was discharged from the ED this afternoon.  He apparently had a seizure and is noncompliant with his Keppra.  He was loaded with IV Keppra and was observed for 6 hours in the ED and had no seizure activity.  Apparently he left [facility], ER and went to behavioral health urgent care.  Patient was noted to have suicidal ideation and hallucinations.  Patient told me that he had another seizure.  However patient is refusing to elaborate further.  Patient was seen by psychiatry last month.  Patient was also admitted to the hospital for seizure work-up about a month ago as well.  The history is provided by the patient.       Home Medications Prior to Admission medications   Medication Sig Start Date End Date Taking? Authorizing Provider  bictegravir-emtricitabine-tenofovir AF (BIKTARVY) 50-200-25 MG TABS tablet Take 1 tablet by mouth daily. 01/13/22   Dartha Lodge, PA-C  citalopram (CELEXA) 10 MG tablet Take 1 tablet (10 mg total) by mouth daily. Patient not taking: Reported on 12/25/2021 12/08/21 01/07/22  Earney Navy, NP  levETIRAcetam (KEPPRA) 250 MG tablet Take 5 tablets (1,250 mg total) by mouth 2 (two) times daily. 01/13/22 02/12/22  Dartha Lodge, PA-C  OLANZapine zydis (ZYPREXA) 5 MG disintegrating tablet Take 1 tablet (5 mg total) by mouth at bedtime. Patient not taking: Reported on 12/25/2021 12/07/21 01/06/22  Earney Navy, NP      Allergies    Patient has no known allergies.    Review of Systems   Review of Systems  Psychiatric/Behavioral:  Positive for behavioral problems and  suicidal ideas.   All other systems reviewed and are negative.   Physical Exam Updated Vital Signs BP 109/76 (BP Location: Left Arm)   Pulse 74   Temp 99.3 F (37.4 C) (Oral)   Resp 18   Ht 5\' 11"  (1.803 m)   Wt 87 kg   SpO2 100%   BMI 26.75 kg/m  Physical Exam Vitals and nursing note reviewed.  Constitutional:      Comments: Agitated  HENT:     Head: Normocephalic.     Nose: Nose normal.     Mouth/Throat:     Mouth: Mucous membranes are moist.  Eyes:     Extraocular Movements: Extraocular movements intact.     Pupils: Pupils are equal, round, and reactive to light.  Cardiovascular:     Rate and Rhythm: Normal rate and regular rhythm.     Pulses: Normal pulses.     Heart sounds: Normal heart sounds.  Pulmonary:     Effort: Pulmonary effort is normal.     Breath sounds: Normal breath sounds.  Abdominal:     General: Abdomen is flat.     Palpations: Abdomen is soft.  Musculoskeletal:        General: Normal range of motion.     Cervical back: Normal range of motion and neck supple.  Skin:    General: Skin is warm.     Capillary Refill: Capillary refill takes less than 2 seconds.  Neurological:  General: No focal deficit present.     Mental Status: He is oriented to person, place, and time.  Psychiatric:     Comments: Agitated and suicidal      ED Results / Procedures / Treatments   Labs (all labs ordered are listed, but only abnormal results are displayed) Labs Reviewed  CBC WITH DIFFERENTIAL/PLATELET  COMPREHENSIVE METABOLIC PANEL  ETHANOL  SALICYLATE LEVEL  ACETAMINOPHEN LEVEL  RAPID URINE DRUG SCREEN, HOSP PERFORMED    EKG None  Radiology CT Cervical Spine Wo Contrast  Result Date: 01/13/2022 CLINICAL DATA:  Neck trauma, midline tenderness EXAM: CT CERVICAL SPINE WITHOUT CONTRAST TECHNIQUE: Multidetector CT imaging of the cervical spine was performed without intravenous contrast. Multiplanar CT image reconstructions were also generated.  RADIATION DOSE REDUCTION: This exam was performed according to the departmental dose-optimization program which includes automated exposure control, adjustment of the mA and/or kV according to patient size and/or use of iterative reconstruction technique. COMPARISON:  11/22/2021 FINDINGS: Alignment: Similar straightened alignment. No subluxation or dislocation. Facets are aligned. Skull base and vertebrae: No acute fracture. No primary bone lesion or focal pathologic process. Soft tissues and spinal canal: No prevertebral fluid or swelling. No visible canal hematoma. Disc levels: Moderate degenerative disc disease most pronounced at C5-6 with similar endplate irregularity, sclerosis and bony spurring. Degenerative changes of the C1-2 articulation as well. Facets are aligned. No subluxation or dislocation. Upper chest: Apical paraseptal emphysema changes.  No acute finding. Other: None. IMPRESSION: Similar cervical degenerative changes as above. No acute osseous finding, fracture, malalignment by CT. Electronically Signed   By: Judie Petit.  Shick M.D.   On: 01/13/2022 10:48   CT HEAD WO CONTRAST ( )  Result Date: 01/13/2022 CLINICAL DATA:  Head trauma EXAM: CT HEAD WITHOUT CONTRAST TECHNIQUE: Contiguous axial images were obtained from the base of the skull through the vertex without intravenous contrast. RADIATION DOSE REDUCTION: This exam was performed according to the departmental dose-optimization program which includes automated exposure control, adjustment of the mA and/or kV according to patient size and/or use of iterative reconstruction technique. COMPARISON:  12/24/2021, 11/22/2021 FINDINGS: Brain: No evidence of acute infarction, hemorrhage, hydrocephalus, extra-axial collection or mass lesion/mass effect. Vascular: No hyperdense vessel or unexpected calcification. Skull: Normal. Negative for fracture or focal lesion. Sinuses/Orbits: Minor maxillary sinus mucosal thickening. Other sinuses clear. No sinus  air-fluid level. Mastoids are clear. No orbital abnormality or asymmetry. Other: None. IMPRESSION: No acute intracranial abnormality by noncontrast CT. Minor maxillary sinus disease. Electronically Signed   By: Judie Petit.  Shick M.D.   On: 01/13/2022 10:44    Procedures Procedures    Medications Ordered in ED Medications - No data to display  ED Course/ Medical Decision Making/ A&P                           Medical Decision Making Justin [name] is a 41 y.o. male here presenting with agitation and suicidal ideation.  Patient has history of seizures.  Patient thought he had another seizure but he was just loaded with IV Keppra earlier today.  Patient also had recent admission and had extensive work-up for the seizures.  At that time, they thought it was medication noncompliance.  Patient now is suicidal and is very agitated.  At this point, we will get medical clearance labs and consult TTS.  We will restart his Keppra as well  10:00 PM I reviewed patient's labs.  Patient's labs were unremarkable and urine and serum tox  are negative.  Medically cleared for psych eval. I restarted his home medications including his Keppra.   Amount and/or Complexity of Data Reviewed Labs: ordered. Decision-making details documented in ED Course.  Risk Prescription drug management.   Final Clinical Impression(s) / ED Diagnoses Final diagnoses:  None    Rx / DC Orders ED Discharge Orders     None         Charlynne Pander, MD 01/14/22 2201

## 2022-01-14 NOTE — ED Notes (Signed)
CW Justin [name] offers to pick up seizure medication for Pt before DC. Agree to hold Pt in room for ~30 minutes while she gets supply- to avoid him leaving without meds.

## 2022-01-14 NOTE — BH Assessment (Signed)
Clinician messaged Electa Sniff, RN: "Hey. It's Trey with TTS. Is the pt able to engage in the assessment, if so the pt will need to be placed in a private room. Is the pt under IVC? Also is the pt medically cleared?"   Clinician awaiting response.    Redmond Pulling, MS, [facility], [facility] Triage Specialist [phone]

## 2022-01-14 NOTE — ED Provider Notes (Signed)
Signed out to d/c to home when keppra done.   Keppra iv has been completed. Vitals normal. Pt up, ambulatory about room, steady gait. Drinking fluids. No new c/o.  Pt currently appears stable for d/c per earlier ED teams plan.      Cathren Laine, MD 01/14/22 [phone]

## 2022-01-14 NOTE — ED Triage Notes (Signed)
Patient BIB EMS from doctors office. Patient was recently discharged from [facility] today and went to the doctors office. Doctors office called [employer] as patient became combative due to office closing. Patient now c/o SI and auditory hallucinations. Patient states auditory hallucinations began a week ago and have increasingly became louder. Patient also states an increase in seizure activity. Last alcoholic drink was 30 days ago per patient.

## 2022-01-15 MED ORDER — ESCITALOPRAM OXALATE 10 MG PO TABS
10.0000 mg | ORAL_TABLET | Freq: Every day | ORAL | Status: DC
  Administered 2022-01-16 – 2022-01-18 (×3): 10 mg via ORAL
  Filled 2022-01-15 (×4): qty 1

## 2022-01-15 NOTE — Consult Note (Signed)
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 male patient presents voluntarily to ED with suicidal ideations and hallucinations.   HPI:   Patient seen via telepsych by this provider; chart reviewed and consulted with Dr. Dwyane Dee on 01/15/22.  On evaluation Justin [name] reports, "I am not doing too well today, but then again I have my mind made up when I leave out this door.  I am going to see what the next life is.  I am gonna stop breathing and stop taking in oxygen." His speech is pressured but he is clear and coherent, able to be redirected verbally.  He endorses feeling of hopelessness and despair, triggered by seizures.  Pt is prescribed keppra, states he does not have seizures when he takes medications but in the setting of homelessness, and people stealing his medications, he reports problems with medication compliance.  He also reports hearing voices , "Pam regime" telling me to hurt myself and others.    He states his goal is to return to [place] [facility], where his boyfriend lives but does not have a means to get there.  He asks for a $100 ticket to get there. Pt then abruptly interrupts himself and restates he plan to end his life.  States most of his family live in the local area, but are not as supportive of him.    He reports dx for depression and anxiety, was previously prescribed citalopram and olanzapine but stopped taking it.  States meds  were prescribed by emergency room provider, his primary care provider.  He denies dx of bipolar, schizoaffective disorders and reports he's not empanelled with outpatient mental health provider despite previously receiving resources for [facility].    Pis current UDS is negative; UDS completed 01/13/2022 was + for amphetamine but pt appears surprised and states he not sure how that happened. He currently denies illicit drug usage.    [place] is familiar with pt as he's been evaluated in the [facility] >12 x within the past 9 months with similar presentation, methamphetamine use disorder, psychosis, suicidal ideations.  No recent psychiatric admissions.  Usually after rest and restarting medications, pt improves, contracts for safety and is appropriate for discharge.  He's received outpatient resources but once discharged usually does not follow-up. Today, he is pretty hard on his suicidal ideations with plan and intent to follow through if discharged today.  Pt is very impulsive, judgment and insight are poor in the setting of medication non-compliance, and he cannot contract for safety.  Pt also has inadequate coping skills and no protective factors which puts him at risk for self harm.    Per ED Provider Admission Assessment 01/14/2022: History   No chief complaint on file. Justin [name] is a 41 y.o. male history of HIV, seizure on Keppra, bipolar, here presenting with suicidal ideation and hallucination.  Patient has been in the ED multiple times recently for seizure-like activity.  Most recently he was discharged from the ED this afternoon.  He apparently had a seizure and is noncompliant with his Keppra.  He was loaded with IV Keppra and was observed for 6 hours in the ED and had no seizure activity.  Apparently he left [facility] ER and went to behavioral health urgent care.  Patient was noted to have suicidal ideation and hallucinations.  Patient told me that he had another seizure.  However  patient is refusing to elaborate further.  Patient was seen by psychiatry last month.  Patient was also admitted to the hospital for seizure work-up about a month ago as well.   The history is provided by the patient.   Past Psychiatric History: as outlined above  Risk to Self:  yes Risk to Others:  no Prior Inpatient Therapy: yes  Prior Outpatient Therapy:  denies  Past Medical History:  Past Medical History:  Diagnosis Date   Chronic hepatitis C without hepatic coma (HCC) 08/17/2017   Endocarditis    Hepatitis C    History of syphilis 08/25/2014   Treated by [facility] 07-2014.   HIV (human immunodeficiency virus infection) (HCC)    HIV (human immunodeficiency virus infection) (HCC)    Seizures (HCC)     Past Surgical History:  Procedure Laterality Date   hand surgery Right    LUMBAR PUNCTURE  08/08/2019       WRIST SURGERY     Family History:  Family History  Adopted: Yes  Problem Relation Age of Onset   Diabetes type II Mother    Family Psychiatric  History: unknown Social History:  Social History   Substance and Sexual Activity  Alcohol Use Not Currently   Comment: in rehab     Social History   Substance and Sexual Activity  Drug Use Not Currently   Types: Marijuana, Methamphetamines   Comment: states last meth use was 08/2021    Social History   Socioeconomic History   Marital status: Single    Spouse name: Not on file   Number of children: Not on file   Years of education: Not on file   Highest education level: Not on file  Occupational History   Not on file  Tobacco Use   Smoking status: Former    Types: Cigarettes   Smokeless tobacco: Never  Vaping Use   Vaping Use: Never used  Substance and Sexual Activity   Alcohol use: Not Currently    Comment: in rehab   Drug use: Not Currently    Types: Marijuana, Methamphetamines    Comment: states last meth use was 08/2021   Sexual activity: Yes    Partners: Male    Birth control/protection: Condom     Comment: given condoms  Other Topics Concern   Not on file  Social History Narrative   ** Merged History Encounter **       ** Merged History Encounter **       ** Merged History Encounter **       Social Determinants of Corporate investment banker Strain: Not on file  Food Insecurity: Not on file  Transportation Needs: Not on file  Physical Activity: Not on file  Stress: Not on file  Social Connections: Not on file   Additional Social History:    Allergies:  No Known Allergies  Labs:  Results for orders placed or performed during the hospital encounter of 01/14/22 (from the past 48 hour(s))  CBC with Differential     Status: None   Collection Time: 01/14/22  7:35 PM  Result Value Ref Range   WBC 6.3 4.0 - 10.5 K/uL   RBC 4.71 4.22 - 5.81 MIL/uL   Hemoglobin 15.6 13.0 - 17.0 g/dL   HCT 46.9 62.9 - 52.8 %   MCV 96.6 80.0 - 100.0 fL   MCH 33.1 26.0 - 34.0 pg   MCHC 34.3 30.0 - 36.0 g/dL   RDW 41.3 24.4 - 01.0 %   Platelets 211 150 - 400 K/uL   nRBC 0.0 0.0 - 0.2 %   Neutrophils Relative % 66 %   Neutro Abs 4.2 1.7 - 7.7 K/uL   Lymphocytes Relative 23 %   Lymphs Abs 1.5 0.7 - 4.0 K/uL   Monocytes Relative 9 %   Monocytes Absolute 0.6 0.1 - 1.0 K/uL   Eosinophils Relative 1 %   Eosinophils Absolute 0.1 0.0 - 0.5 K/uL   Basophils Relative 1 %   Basophils Absolute 0.0 0.0 - 0.1 K/uL   Immature Granulocytes 0 %   Abs Immature Granulocytes 0.01 0.00 - 0.07 K/uL    Comment: Performed at [facility], [address], [place], [place] [address]  Comprehensive metabolic panel     Status: Abnormal   Collection Time: 01/14/22  7:35 PM  Result Value Ref Range   Sodium 140 135 - 145 mmol/L   Potassium 3.2 (L) 3.5 - 5.1 mmol/L   Chloride 106 98 - 111 mmol/L   CO2 28 22 - 32 mmol/L   Glucose, Bld 116 (H) 70 - 99 mg/dL    Comment: Glucose reference range applies only to samples taken after fasting for at least 8 hours.   BUN 7 6 - 20 mg/dL   Creatinine,  Ser 6.64 0.61 - 1.24 mg/dL   Calcium 8.8 (L) 8.9 - 10.3 mg/dL   Total Protein 7.0 6.5 - 8.1 g/dL   Albumin 3.9 3.5 - 5.0 g/dL   AST 22 15 - 41 U/L   ALT 23 0 - 44 U/L   Alkaline Phosphatase 72 38 - 126 U/L   Total Bilirubin 1.3 (H) 0.3 - 1.2 mg/dL   GFR, Estimated >40 >34 mL/min    Comment: (NOTE) Calculated using the CKD-EPI Creatinine Equation (2021)    Anion gap 6 5 - 15    Comment: Performed at [facility], [address], [place], [place] [address]  Ethanol     Status: None   Collection Time: 01/14/22  7:35 PM  Result Value Ref Range   Alcohol, Ethyl (B) <10 <10 mg/dL    Comment: (NOTE) Lowest detectable limit for serum alcohol is 10 mg/dL.  For medical purposes only. Performed at [facility], [address] [place], [place] [address]   Salicylate level     Status: Abnormal   Collection Time: 01/14/22  7:35 PM  Result Value Ref Range   Salicylate Lvl <7.0 (L) 7.0 - 30.0 mg/dL    Comment: Performed at [facility], [address] [place], [place] [address]  Acetaminophen level     Status: Abnormal   Collection Time: 01/14/22  7:35 PM  Result Value Ref Range   Acetaminophen (Tylenol), Serum <10 (L) 10 - 30 ug/mL    Comment: (NOTE) Therapeutic concentrations vary significantly. A range of 10-30 ug/mL  may be an effective concentration for many patients. However, some  are best treated at concentrations outside of this range. Acetaminophen concentrations >150 ug/mL at 4 hours after ingestion  and >50 ug/mL at 12 hours after ingestion are  often associated with  toxic reactions.  Performed at [facility], [place] [address] [place], [place] [address]   Rapid urine drug screen (hospital performed)     Status: None   Collection Time: 01/14/22  7:35 PM  Result Value Ref Range   Opiates NONE DETECTED NONE DETECTED   Cocaine NONE DETECTED NONE DETECTED   Benzodiazepines NONE DETECTED NONE  DETECTED   Amphetamines NONE DETECTED NONE DETECTED   Tetrahydrocannabinol NONE DETECTED NONE DETECTED   Barbiturates NONE DETECTED NONE DETECTED    Comment: (NOTE) DRUG SCREEN FOR MEDICAL PURPOSES ONLY.  IF CONFIRMATION IS NEEDED FOR ANY PURPOSE, NOTIFY LAB WITHIN 5 DAYS.  LOWEST DETECTABLE LIMITS FOR URINE DRUG SCREEN Drug Class                     Cutoff (ng/mL) Amphetamine and metabolites    1000 Barbiturate and metabolites    200 Benzodiazepine                 [id number] Tricyclics and metabolites     300 Opiates and metabolites        300 Cocaine and metabolites        300 THC                            50 Performed at [facility], [place] [address] [place], [place] [address]     Medications:  Current Facility-Administered Medications  Medication Dose Route Frequency Provider Last Rate Last Admin   bictegravir-emtricitabine-tenofovir AF (BIKTARVY) 50-200-25 MG per tablet 1 tablet  1 tablet Oral Daily Drenda Freeze, MD   1 tablet at 01/15/22 0926   [START ON 01/16/2022] escitalopram (LEXAPRO) tablet 10 mg  10 mg Oral Daily Merlyn Lot E, NP       levETIRAcetam (KEPPRA) tablet 1,250 mg  1,250 mg Oral BID Drenda Freeze, MD   1,250 mg at 01/15/22 0926   OLANZapine zydis (ZYPREXA) disintegrating tablet 5 mg  5 mg Oral QHS Drenda Freeze, MD   5 mg at 01/15/22 0132   Current Outpatient Medications  Medication Sig Dispense Refill   bictegravir-emtricitabine-tenofovir AF (BIKTARVY) 50-200-25 MG TABS tablet Take 1 tablet by mouth daily. 30 tablet 0   levETIRAcetam (KEPPRA) 250 MG tablet Take 5 tablets (1,250 mg total) by mouth 2 (two) times daily. 300 tablet 0   OLANZapine zydis (ZYPREXA) 5 MG disintegrating tablet Take 1 tablet (5 mg total) by mouth at bedtime. 30 tablet 0   citalopram (CELEXA) 10 MG tablet Take 1 tablet (10 mg total) by mouth daily. (Patient not taking: Reported on 01/14/2022) 30 tablet 0    Musculoskeletal: pt seen independently  ambulating and moving all extremities Strength & Muscle Tone: within normal limits Gait & Station: normal Patient leans: N/A  Psychiatric Specialty Exam:  Presentation  General Appearance: Bizarre  Eye Contact:Fair  Speech:Pressured  Speech Volume:Increased  Handedness:Right   Mood and Affect  Mood:Anxious; Depressed; Dysphoric; Hopeless; Irritable  Affect:Congruent; Constricted   Thought Process  Thought Processes:Coherent  Descriptions of Associations:Circumstantial  Orientation:Full (Time, Place and Person)  Thought Content:Delusions  History of Schizophrenia/Schizoaffective disorder:Yes  Duration of Psychotic Symptoms:Greater than six months  Hallucinations:Hallucinations: Auditory; Command Description of Command Hallucinations: "Pam regime telling my to hurt others and myself as well" Description of Auditory Hallucinations: as outlined above  Ideas of Reference:Paranoia  Suicidal Thoughts:Suicidal Thoughts: Yes, Active SI Active Intent and/or Plan: With  Intent; With Plan (to overdose on pills)  Homicidal Thoughts:Homicidal Thoughts: No   Sensorium  Memory:Immediate Good; Recent Good; Remote Good  Judgment:Impaired  Insight:Lacking   Executive Functions  Concentration:Fair  Attention Span:Fair  [place] of Knowledge:Good  Language:Good   Psychomotor Activity  Psychomotor Activity:Psychomotor Activity: Normal   Assets  Assets:Communication Skills   Sleep  Sleep:Sleep: Fair Number of Hours of Sleep: 6    Physical Exam: Physical Exam Constitutional:      Appearance: Normal appearance.  Cardiovascular:     Rate and Rhythm: Normal rate.     Pulses: Normal pulses.  Pulmonary:     Effort: Pulmonary effort is normal.  Musculoskeletal:     Cervical back: Normal range of motion.  Neurological:     General: No focal deficit present.     Mental Status: He is alert and oriented to person, place, and time.  Psychiatric:         Attention and Perception: He perceives auditory hallucinations.        Mood and Affect: Mood is anxious and depressed. Affect is flat.        Speech: Speech is rapid and pressured.        Behavior: Behavior is hyperactive.        Thought Content: Thought content is delusional. Thought content includes suicidal ideation. Thought content includes suicidal plan.        Cognition and Memory: Cognition and memory normal.        Judgment: Judgment is impulsive.    Review of Systems  Constitutional: Negative.   HENT: Negative.    Eyes: Negative.   Respiratory: Negative.    Cardiovascular: Negative.   Gastrointestinal: Negative.   Genitourinary: Negative.   Skin: Negative.   Neurological: Negative.   Endo/Heme/Allergies: Negative.   Psychiatric/Behavioral:  Positive for depression, hallucinations and suicidal ideas. Negative for substance abuse. The patient is nervous/anxious.    Blood pressure 132/74, pulse 78, temperature 99 F (37.2 C), temperature source Oral, resp. rate 16, height 5\' 11"  (1.803 m), weight 87 kg, SpO2 95 %. Body mass index is 26.75 kg/m.  Treatment Plan Summary: [facility] is familiar with pt as he's been evaluated in the [place] >12 x within the past 9 months with similar presentation, methamphetamine use disorder, psychosis, suicidal ideations.  Usually after rest and restarting medications, pt improves, contracts for safety and is appropriate for discharge.  He's received outpatient resources but once discharged usually does not follow-up. Today, he is pretty hard on his suicidal ideations with plan and intent to follow through if discharged today.  Pt is very impulsive, judgment and insight are poor in the setting of medication non-compliance, and he cannot contract for safety.  Pt also has inadequate coping skills and no protective factors which puts him at high risk for self harm.   Pts history for seizure disorder may make him a difficult psych placement but  he's not appropriate for discharge today.  He needs to be restarted on psych meds and monitored for mood stability and safety.  Pt does not have allergies; LFT WNL to restart meds; EKG does not show prolonged QT intervals but show probable anteroseptal infarcts.  In lieu of this, will not restart citalopram, will give escitalopram instead.  Recommend restarting the following medications: Olanzapine 5mg  po daily for psychosis Escitalopram 5mg  po daily for mood   Disposition: Recommend psychiatric Inpatient admission when medically cleared.  This service was provided via telemedicine using a 2-way, interactive  audio and Radiographer, therapeutic.  Names of all persons participating in this telemedicine service and their role in this encounter. Name: Nichael Reem Role: Patient  Name: Merlyn Lot Role: PMHNP    Mallie Darting, NP 01/15/2022 1:51 PM

## 2022-01-15 NOTE — ED Notes (Signed)
TELE PSYCH machine to bedside

## 2022-01-15 NOTE — ED Provider Notes (Signed)
Emergency Medicine Observation Re-evaluation Note  Justin [name] is a 41 y.o. male, seen on rounds today.  Pt initially presented to the ED for complaints of No chief complaint on file. Currently, the patient is resting in the hallway.  Physical Exam  BP 132/74 (BP Location: Left Arm)   Pulse 78   Temp 99 F (37.2 C) (Oral)   Resp 16   Ht 1.803 m (5\' 11" )   Wt 87 kg   SpO2 95%   BMI 26.75 kg/m  Physical Exam General: Well-appearing  Psych: Endorses passive suicidal ideation without active plan  ED Course / MDM  EKG:   I have reviewed the labs performed to date as well as medications administered while in observation.  Recent changes in the last 24 hours include nothing.  Plan  Current plan is for behavioral health reassessment this morning with possible discharge.  [place] is not under involuntary commitment.     Hessie Knows, MD 01/15/22 [phone]

## 2022-01-15 NOTE — ED Notes (Signed)
Pt has been changed into purple scrubs and wanded by security. Pt has two bags labeled in cabinet 5-8

## 2022-01-16 NOTE — ED Provider Notes (Signed)
Emergency Medicine Observation Re-evaluation Note  Justin [name] is a 41 y.o. male, seen on rounds today.  Pt initially presented to the ED for complaints of No chief complaint on file. Currently, the patient is resting in hallway.  Easily agitated.  Physical Exam  BP 122/77 (BP Location: Left Arm)   Pulse 62   Temp 98 F (36.7 C) (Oral)   Resp 18   Ht 1.803 m (5\' 11" )   Wt 87 kg   SpO2 98%   BMI 26.75 kg/m  Physical Exam General: Well-appearing  Psych: Active suicidal ideations with plan  ED Course / MDM  EKG:   I have reviewed the labs performed to date as well as medications administered while in observation.  Recent changes in the last 24 hours include seen by behavioral health and recommended inpatient hospitalization.  Plan  Current plan is for placement.  [facility] is not under involuntary commitment.     Hessie Knows, MD 01/16/22 1019

## 2022-01-16 NOTE — Progress Notes (Signed)
[facility] ED Progress Note  01/16/2022 2:03 PM Justin [name]  MRN:  [id number]   Subjective:  "I am doing the same as yesterday."  Justin [name] "Justin [name]" is a 41 year old male with a history of methamphetamine induced psychotic disorder, HIV, Chronic Hep C, Seizure disorder, MDD with psychotic features who presented voluntarily and unaccompanied to [facility] on 01/14/2022 due to suicidal ideations.   On evaluation today, the patient is alert and oriented. He states "I am doing the same as yesterday." Speech is clear and coherent, normal pace. Reports continued suicidal ideations. Does not discuss suicide plan, but states "I can't face the world." He is unable to contract for safety. He reports hearing the voices of the "Pam regime" that tell him to hurt people or they are "going to make my life a living hell." He perseverates on these voices and states that the "Pam regime is real, maybe they are extraterrestrials or something." He states that he does not want to harm anyone else. Reports seeing shadows out of his periphery.   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[place])   Suicidal ideations   ED Assessment Time Calculation: Start Time: 1300 Stop Time: 1320 Total Time in Minutes (Assessment Completion): [place] Scale:  [place] ED from 01/14/2022 in [place] DEPT Most recent reading at 01/14/2022 11:23 PM ED from 01/14/2022 in [place] Most recent reading at 01/14/2022 12:14 PM ED from 01/13/2022 in [place] Most recent reading at 01/13/2022  3:57 AM  C-SSRS RISK CATEGORY High Risk High Risk No Risk       Past Medical History:  Past Medical History:  Diagnosis Date   Chronic  hepatitis C without hepatic coma ([place]) 08/17/2017   Endocarditis    Hepatitis C    History of syphilis 08/25/2014   Treated by [facility] 07-2014.   HIV (human immunodeficiency virus infection) ([place])    HIV (human immunodeficiency virus infection) ([place])    Seizures ([place])     Past Surgical History:  Procedure Laterality Date   hand surgery Right    LUMBAR PUNCTURE  08/08/2019       WRIST SURGERY     Family History:  Family History  Adopted: Yes  Problem Relation Age of Onset   Diabetes type II Mother     Social History:  Social History   Substance and Sexual Activity  Alcohol Use Not Currently   Comment: in rehab     Social History   Substance and Sexual Activity  Drug Use Not Currently   Types: Marijuana, Methamphetamines   Comment: states last meth use was 08/2021    Social History   Socioeconomic History   Marital status: Single    Spouse name: Not on file   Number of children: Not on file   Years of education: Not on file   Highest education level: Not on file  Occupational History   Not on file  Tobacco Use   Smoking status: Former    Types: Cigarettes   Smokeless tobacco: Never  Vaping Use   Vaping Use: Never used  Substance and Sexual Activity   Alcohol use: Not Currently    Comment: in rehab   Drug use: Not Currently    Types: Marijuana, Methamphetamines  Comment: states last meth use was 08/2021   Sexual activity: Yes    Partners: Male    Birth control/protection: Condom    Comment: given condoms  Other Topics Concern   Not on file  Social History Narrative   ** Merged History Encounter **       ** Merged History Encounter **       ** Merged History Encounter **       Social Determinants of Corporate investment banker Strain: Not on file  Food Insecurity: Not on file  Transportation Needs: Not on file  Physical Activity: Not on file  Stress: Not on file  Social Connections: Not on file    Sleep: Fair  Appetite:  Fair  Current  Medications: Current Facility-Administered Medications  Medication Dose Route Frequency Provider Last Rate Last Admin   bictegravir-emtricitabine-tenofovir AF (BIKTARVY) 50-200-25 MG per tablet 1 tablet  1 tablet Oral Daily Charlynne Pander, MD   1 tablet at 01/16/22 0927   escitalopram (LEXAPRO) tablet 10 mg  10 mg Oral Daily Ophelia Shoulder E, NP   10 mg at 01/16/22 6644   levETIRAcetam (KEPPRA) tablet 1,250 mg  1,250 mg Oral BID Charlynne Pander, MD   1,250 mg at 01/16/22 0927   OLANZapine zydis (ZYPREXA) disintegrating tablet 5 mg  5 mg Oral QHS Charlynne Pander, MD   5 mg at 01/15/22 2110   Current Outpatient Medications  Medication Sig Dispense Refill   bictegravir-emtricitabine-tenofovir AF (BIKTARVY) 50-200-25 MG TABS tablet Take 1 tablet by mouth daily. 30 tablet 0   levETIRAcetam (KEPPRA) 250 MG tablet Take 5 tablets (1,250 mg total) by mouth 2 (two) times daily. 300 tablet 0   OLANZapine zydis (ZYPREXA) 5 MG disintegrating tablet Take 1 tablet (5 mg total) by mouth at bedtime. 30 tablet 0   citalopram (CELEXA) 10 MG tablet Take 1 tablet (10 mg total) by mouth daily. (Patient not taking: Reported on 01/14/2022) 30 tablet 0    Lab Results:  Results for orders placed or performed during the hospital encounter of 01/14/22 (from the past 48 hour(s))  CBC with Differential     Status: None   Collection Time: 01/14/22  7:35 PM  Result Value Ref Range   WBC 6.3 4.0 - 10.5 K/uL   RBC 4.71 4.22 - 5.81 MIL/uL   Hemoglobin 15.6 13.0 - 17.0 g/dL   HCT 03.4 74.2 - 59.5 %   MCV 96.6 80.0 - 100.0 fL   MCH 33.1 26.0 - 34.0 pg   MCHC 34.3 30.0 - 36.0 g/dL   RDW 63.8 75.6 - 43.3 %   Platelets 211 150 - 400 K/uL   nRBC 0.0 0.0 - 0.2 %   Neutrophils Relative % 66 %   Neutro Abs 4.2 1.7 - 7.7 K/uL   Lymphocytes Relative 23 %   Lymphs Abs 1.5 0.7 - 4.0 K/uL   Monocytes Relative 9 %   Monocytes Absolute 0.6 0.1 - 1.0 K/uL   Eosinophils Relative 1 %   Eosinophils Absolute 0.1 0.0 - 0.5  K/uL   Basophils Relative 1 %   Basophils Absolute 0.0 0.0 - 0.1 K/uL   Immature Granulocytes 0 %   Abs Immature Granulocytes 0.01 0.00 - 0.07 K/uL    Comment: Performed at [facility], [address], [place], [place] [address]  Comprehensive metabolic panel     Status: Abnormal   Collection Time: 01/14/22  7:35 PM  Result Value Ref Range  Sodium 140 135 - 145 mmol/L   Potassium 3.2 (L) 3.5 - 5.1 mmol/L   Chloride 106 98 - 111 mmol/L   CO2 28 22 - 32 mmol/L   Glucose, Bld 116 (H) 70 - 99 mg/dL    Comment: Glucose reference range applies only to samples taken after fasting for at least 8 hours.   BUN 7 6 - 20 mg/dL   Creatinine, Ser 9.56 0.61 - 1.24 mg/dL   Calcium 8.8 (L) 8.9 - 10.3 mg/dL   Total Protein 7.0 6.5 - 8.1 g/dL   Albumin 3.9 3.5 - 5.0 g/dL   AST 22 15 - 41 U/L   ALT 23 0 - 44 U/L   Alkaline Phosphatase 72 38 - 126 U/L   Total Bilirubin 1.3 (H) 0.3 - 1.2 mg/dL   GFR, Estimated >38 >75 mL/min    Comment: (NOTE) Calculated using the CKD-EPI Creatinine Equation (2021)    Anion gap 6 5 - 15    Comment: Performed at [facility], [address], [place], [place] [address]  Ethanol     Status: None   Collection Time: 01/14/22  7:35 PM  Result Value Ref Range   Alcohol, Ethyl (B) <10 <10 mg/dL    Comment: (NOTE) Lowest detectable limit for serum alcohol is 10 mg/dL.  For medical purposes only. Performed at [facility], [address] [place], [place] [address]   Salicylate level     Status: Abnormal   Collection Time: 01/14/22  7:35 PM  Result Value Ref Range   Salicylate Lvl <7.0 (L) 7.0 - 30.0 mg/dL    Comment: Performed at [facility], [address], [place], [place] [address]  Acetaminophen level     Status: Abnormal   Collection Time: 01/14/22  7:35 PM  Result Value Ref Range   Acetaminophen (Tylenol), Serum <10 (L) 10 - 30 ug/mL    Comment: (NOTE) Therapeutic concentrations  vary significantly. A range of 10-30 ug/mL  may be an effective concentration for many patients. However, some  are best treated at concentrations outside of this range. Acetaminophen concentrations >150 ug/mL at 4 hours after ingestion  and >50 ug/mL at 12 hours after ingestion are often associated with  toxic reactions.  Performed at [facility], [address], [place], [place] [address]   Rapid urine drug screen (hospital performed)     Status: None   Collection Time: 01/14/22  7:35 PM  Result Value Ref Range   Opiates NONE DETECTED NONE DETECTED   Cocaine NONE DETECTED NONE DETECTED   Benzodiazepines NONE DETECTED NONE DETECTED   Amphetamines NONE DETECTED NONE DETECTED   Tetrahydrocannabinol NONE DETECTED NONE DETECTED   Barbiturates NONE DETECTED NONE DETECTED    Comment: (NOTE) DRUG SCREEN FOR MEDICAL PURPOSES ONLY.  IF CONFIRMATION IS NEEDED FOR ANY PURPOSE, NOTIFY LAB WITHIN 5 DAYS.  LOWEST DETECTABLE LIMITS FOR URINE DRUG SCREEN Drug Class                     Cutoff (ng/mL) Amphetamine and metabolites    1000 Barbiturate and metabolites    200 Benzodiazepine                 200 Tricyclics and metabolites     300 Opiates and metabolites        300 Cocaine and metabolites        300 THC  50 Performed at [facility], [place] [address], [place], [place] [address]     Blood Alcohol level:  Lab Results  Component Value Date   [facility] <10 01/14/2022   ETH <10 12/24/2021    Physical Findings:  CIWA:    COWS:     Musculoskeletal: Strength & Muscle Tone: within normal limits Gait & Station: normal Patient leans: N/A  Psychiatric Specialty Exam:  Presentation  General Appearance: Casual  Eye Contact:Fair  Speech:Normal Rate  Speech Volume:Normal  Handedness:Right   Mood and Affect  Mood:Anxious; Depressed; Hopeless; Irritable  Affect:Congruent; Constricted   Thought Process  Thought  Processes:Coherent  Descriptions of Associations:Circumstantial  Orientation:Full (Time, Place and Person)  Thought Content:Delusions  History of Schizophrenia/Schizoaffective disorder:Yes  Duration of Psychotic Symptoms:Greater than six months  Hallucinations:Hallucinations: Auditory; Command Description of Command Hallucinations: "Pam regime telling my to hurt others and myself as well" Description of Auditory Hallucinations: as outlined above  Ideas of Reference:Paranoia  Suicidal Thoughts:Suicidal Thoughts: Yes, Active SI Active Intent and/or Plan: With Intent; With Plan; With Means to Carry Out  Homicidal Thoughts:Homicidal Thoughts: No   Sensorium  Memory:Immediate Good; Recent Good; Remote Good  Judgment:Impaired  Insight:Lacking   Executive Functions  Concentration:Fair  Attention Span:Fair  Recall:Good  Fund of Knowledge:Good  Language:Good   Psychomotor Activity  Psychomotor Activity:Psychomotor Activity: Normal   Assets  Assets:Communication Skills; Physical Health   Sleep  Sleep:Sleep: Fair Number of Hours of Sleep: 6    Physical Exam: Physical Exam Constitutional:      General: He is not in acute distress.    Appearance: He is not ill-appearing, toxic-appearing or diaphoretic.  HENT:     Right Ear: External ear normal.     Left Ear: External ear normal.  Eyes:     Pupils: Pupils are equal, round, and reactive to light.  Cardiovascular:     Rate and Rhythm: Normal rate.  Pulmonary:     Effort: Pulmonary effort is normal. No respiratory distress.  Musculoskeletal:        General: Normal range of motion.  Neurological:     Mental Status: He is alert and oriented to person, place, and time.  Psychiatric:        Behavior: Behavior is cooperative.        Thought Content: Thought content is paranoid and delusional. Thought content includes suicidal ideation. Thought content does not include homicidal ideation.    Review of Systems   Constitutional:  Negative for chills, diaphoresis, fever, malaise/fatigue and weight loss.  Cardiovascular:  Negative for chest pain and palpitations.  Gastrointestinal:  Negative for diarrhea, nausea and vomiting.  Neurological:  Negative for dizziness and seizures.  Psychiatric/Behavioral:  Positive for substance abuse. Negative for depression, hallucinations, memory loss and suicidal ideas. The patient has insomnia. The patient is not nervous/anxious.    Blood pressure 122/77, pulse 62, temperature 98 F (36.7 C), temperature source Oral, resp. rate 18, height 5\' 11"  (1.803 m), weight 87 kg, SpO2 98 %. Body mass index is 26.75 kg/m.   Medical Decision Making: Continue to recommend inpatient psychiatric treatment. CSW has faced patient out other facilities.   Olanzapine 5mg  po daily for psychosis Escitalopram 10 mg po daily for mood    Problem 1: Suicidal ideations  Problem 2: MDD   Rozetta Nunnery, NP 01/16/2022, 2:03 PM

## 2022-01-16 NOTE — ED Notes (Signed)
IV remove L AC

## 2022-01-16 NOTE — Progress Notes (Signed)
Per Roselyn Bering, NP, patient meets criteria for inpatient treatment. There are no available beds at [facility] today. CSW faxed referrals to the following facilities for review:  [facility]  Pending - Request Sent N/A [address], [place] [address] [phone] --  [facility]  Pending - Request Sent N/A [address] [place] [address] [phone] --  [facility]  Pending - Request Sent N/A [address] [place] [address] [phone] --  [facility]  Pending - Request Sent N/A [address] [place] [address] [phone] --  [facility]  Pending - Request Sent N/A [address] [place] [address] [phone] --  [facility]  Pending - Request Sent N/A [address] [place], [place] [address] [phone] --  [facility]  Pending - Request Sent N/A [address] [place] [address] [phone] --  [facility]  Pending - Request Sent N/A [address], [place] [address] [phone] --  [facility]  Pending - Request Sent N/A [address] [place] [address] [phone] --  [facility]  Pending - Request Sent N/A [address] [place] [address] [phone] --  [facility]  Pending - Request Sent N/A [address] [place] [address] [phone] --  [facility]  Pending - Request Sent N/A [address], [place] [address] [phone] --  [facility]  Pending - Request Sent N/A [address] [place] [address] [phone] --  [facility]  Pending - Request Sent N/A [address] [place] [address] [phone] --  [facility]  Pending - Request Sent N/A [address], [place] [address] [phone] --  [facility]  Pending - Request Sent N/A [address] [place] [address] [phone] --   TTS will continue to seek bed placement.  Crissie Reese, MSW, Lenice Pressman Phone: [phone] Disposition/TOC

## 2022-01-16 NOTE — ED Notes (Signed)
Report received from previous RN.  Patient currently resting quietly on hallway with eyes closed.  Sitter at bedside.  Area has been cleared of all required equipment to promote safety.

## 2022-01-17 MED ORDER — LORAZEPAM 1 MG PO TABS
1.0000 mg | ORAL_TABLET | Freq: Once | ORAL | Status: AC
  Administered 2022-01-17: 1 mg via ORAL
  Filled 2022-01-17: qty 1

## 2022-01-17 NOTE — ED Provider Notes (Signed)
Emergency Medicine Observation Re-evaluation Note  Justin [name] is a 41 y.o. male, seen on rounds today.  Pt initially presented to the ED for complaints of Psychiatric Evaluation Currently, the patient is resting.  Physical Exam  BP 134/79 (BP Location: Right Arm)   Pulse 65   Temp 98.5 F (36.9 C) (Oral)   Resp 18   Ht 1.803 m (5\' 11" )   Wt 87 kg   SpO2 100%   BMI 26.75 kg/m  Physical Exam General: Resting Cardiac: Regular rate Lungs: Breathing easily Psych: Calm  ED Course / MDM  EKG:   I have reviewed the labs performed to date as well as medications administered while in observation.  Recent changes in the last 24 hours include no acute events last 24 hours..  Plan  Current plan is for inpatient treatment.  [facility] is not under involuntary commitment.     Hessie Knows, MD 01/17/22 [phone]

## 2022-01-17 NOTE — ED Notes (Signed)
Pt has been calm and cooperative today. Pt was taken to TCU to get a shower. Pt brushed his teeth and changed clothes after his shower.

## 2022-01-17 NOTE — BH Assessment (Signed)
BHH Assessment Progress Note   Per Nira Conn, NP, this voluntry pt requires psychiatric hospitalization at this time.  [facility] and [facility] will not be able to accommodate pt today, and pt has been declined by [facility].  At the direction of Nelly Rout, MD this writer has sought placement for pt at facilities outside of the [facility] system. The following facilities have been contacted to seek placement for this pt, with results as noted:  Beds available, information sent, decision pending: [facility]   If this voluntary pt is accepted to a facility, please discuss disposition with pt to be sure that he agrees to the plan.  If a facility agrees to accept pt and the plan changes in any way please call the facility to inform them of the change.  Final disposition is pending as of this writing.  Doylene Canning, [place] Behavioral Health Coordinator [phone]

## 2022-01-17 NOTE — Progress Notes (Addendum)
[name] ED Progress Note  01/17/2022 5:42 PM Justin [name]  MRN:  [id number]   Subjective:  "I still want to take a whole bottle of pills if I walk out the door."  [name] "Justin [name]" is a 41 year old male with a history of methamphetamine induced psychotic disorder, HIV, Chronic Hep C, Seizure disorder, MDD with psychotic features who presented voluntarily and unaccompanied to [name] on 01/14/2022 due to suicidal ideations.   On evaluation patient is alert and oriented x 4, pleasant, and cooperative. Speech is clear and coherent. Mood is depressed and affect is congruent with mood. Thought process is coherent and thought content is logical. Denies auditory and visual hallucinations. No indication that patient is responding to internal stimuli. States that he last heard voices last night. Endorses SI with plan to overdose.  Denies homicidal ideations. Reports that he has been sober from all substances for 3 months.  UDS positive for amphetamines on 01/13/2022 and 12/11/21.  [facility] is familiar with pt as he's been evaluated in the [facility] >12 x within the past 9 months with similar presentation, methamphetamine use disorder, psychosis, suicidal ideations.  No recent psychiatric admissions.  Usually after rest and restarting medications, pt improves, contracts for safety and is appropriate for discharge.  He's received outpatient resources but once discharged usually does not follow-up.    Principal Problem: Amphetamine-induced mood disorder (HCC) Diagnosis:  Principal Problem:   Amphetamine-induced mood disorder (HCC) Active Problems:   Suicidal ideations   Major depressive disorder with psychotic features [name])   ED Assessment Time Calculation: Start Time: 0915 Stop Time: 0930 Total Time in Minutes (Assessment Completion): 15    [place] Scale:  Flowsheet Row ED from 01/14/2022 in [facility] Most recent reading at 01/14/2022 11:23 PM  ED from 01/14/2022 in [facility] EMERGENCY DEPARTMENT Most recent reading at 01/14/2022 12:14 PM ED from 01/13/2022 in [facility] EMERGENCY DEPARTMENT Most recent reading at 01/13/2022  3:57 AM  C-SSRS RISK CATEGORY High Risk High Risk No Risk       Past Medical History:  Past Medical History:  Diagnosis Date   Chronic hepatitis C without hepatic coma (HCC) 08/17/2017   Endocarditis    Hepatitis C    History of syphilis 08/25/2014   Treated by [facility] 07-2014.   HIV (human immunodeficiency virus infection) (HCC)    HIV (human immunodeficiency virus infection) (HCC)    Seizures (HCC)     Past Surgical History:  Procedure Laterality Date   hand surgery Right    LUMBAR PUNCTURE  08/08/2019       WRIST SURGERY     Family History:  Family History  Adopted: Yes  Problem Relation Age of Onset   Diabetes type II Mother     Social History:  Social History   Substance and Sexual Activity  Alcohol Use Not Currently   Comment: in rehab     Social History   Substance and Sexual Activity  Drug Use Not Currently   Types: Marijuana, Methamphetamines   Comment: states last meth use was 08/2021    Social History   Socioeconomic History   Marital status: Single    Spouse name: Not on file   Number of children: Not on file   Years of education: Not on file   Highest education level: Not on file  Occupational History   Not on file  Tobacco Use   Smoking status: Former  Types: Cigarettes   Smokeless tobacco: Never  Vaping Use   Vaping Use: Never used  Substance and Sexual Activity   Alcohol use: Not Currently    Comment: in rehab   Drug use: Not Currently    Types: Marijuana, Methamphetamines    Comment: states last meth use was 08/2021   Sexual activity: Yes    Partners: Male    Birth control/protection: Condom    Comment: given condoms  Other Topics Concern   Not on file  Social History Narrative   ** Merged History Encounter **        ** Merged History Encounter **       ** Merged History Encounter **       Social Determinants of Radio broadcast assistant Strain: Not on file  Food Insecurity: Not on file  Transportation Needs: Not on file  Physical Activity: Not on file  Stress: Not on file  Social Connections: Not on file    Sleep: Good  Appetite:  Good  Current Medications: Current Facility-Administered Medications  Medication Dose Route Frequency Provider Last Rate Last Admin   bictegravir-emtricitabine-tenofovir AF (BIKTARVY) 50-200-25 MG per tablet 1 tablet  1 tablet Oral Daily Drenda Freeze, MD   1 tablet at 01/17/22 0938   escitalopram (LEXAPRO) tablet 10 mg  10 mg Oral Daily Merlyn Lot E, NP   10 mg at 01/17/22 0909   levETIRAcetam (KEPPRA) tablet 1,250 mg  1,250 mg Oral BID Drenda Freeze, MD   1,250 mg at 01/17/22 0909   OLANZapine zydis (ZYPREXA) disintegrating tablet 5 mg  5 mg Oral QHS Drenda Freeze, MD   5 mg at 01/16/22 2120   Current Outpatient Medications  Medication Sig Dispense Refill   bictegravir-emtricitabine-tenofovir AF (BIKTARVY) 50-200-25 MG TABS tablet Take 1 tablet by mouth daily. 30 tablet 0   levETIRAcetam (KEPPRA) 250 MG tablet Take 5 tablets (1,250 mg total) by mouth 2 (two) times daily. 300 tablet 0   OLANZapine zydis (ZYPREXA) 5 MG disintegrating tablet Take 1 tablet (5 mg total) by mouth at bedtime. 30 tablet 0   citalopram (CELEXA) 10 MG tablet Take 1 tablet (10 mg total) by mouth daily. (Patient not taking: Reported on 01/14/2022) 30 tablet 0    Lab Results: No results found for this or any previous visit (from the past 48 hour(s)).  Blood Alcohol level:  Lab Results  Component Value Date   ETH <10 01/14/2022   ETH <10 12/24/2021    Physical Findings:  CIWA:    COWS:     Musculoskeletal: Strength & Muscle Tone: within normal limits Gait & Station: normal Patient leans: N/A  Psychiatric Specialty Exam:  Presentation  General  Appearance: Casual  Eye Contact:Good  Speech:Clear and Coherent; Normal Rate  Speech Volume:Normal  Handedness:Right   Mood and Affect  Mood:Depressed  Affect:Appropriate   Thought Process  Thought Processes:Coherent; Goal Directed; Linear  Descriptions of Associations:Intact  Orientation:Full (Time, Place and Person)  Thought Content:Logical  History of Schizophrenia/Schizoaffective disorder:Yes  Duration of Psychotic Symptoms:Greater than six months  Hallucinations:Hallucinations: None  Ideas of Reference:Paranoia  Suicidal Thoughts:Suicidal Thoughts: Yes, Passive (patient states that he wants to kill himself, but doesn't want to kill himself) SI Active Intent and/or Plan: With Intent; With Plan; With Means to [place]  Homicidal Thoughts:Homicidal Thoughts: No   Sensorium  Memory:Immediate Good; Recent Good; Remote Good  Judgment:Intact  Insight:Present   Executive Functions  Concentration:Good  Attention Span:Good  [place]  of Knowledge:Good  Language:Good   Psychomotor Activity  Psychomotor Activity:Psychomotor Activity: Normal   Assets  Assets:Communication Skills; Physical Health   Sleep  Sleep:Sleep: Good    Physical Exam: Physical Exam Constitutional:      General: He is not in acute distress.    Appearance: He is not ill-appearing, toxic-appearing or diaphoretic.  HENT:     Right Ear: External ear normal.     Left Ear: External ear normal.  Eyes:     General:        Right eye: No discharge.        Left eye: No discharge.  Cardiovascular:     Rate and Rhythm: Normal rate.  Pulmonary:     Effort: Pulmonary effort is normal. No respiratory distress.  Musculoskeletal:        General: Normal range of motion.  Neurological:     Mental Status: He is alert and oriented to person, place, and time.  Psychiatric:        Speech: Speech normal.        Thought Content: Thought content includes suicidal ideation.  Thought content does not include homicidal ideation. Thought content includes suicidal plan.    Review of Systems  Constitutional:  Negative for chills, diaphoresis, fever, malaise/fatigue and weight loss.  Cardiovascular:  Negative for chest pain and palpitations.  Gastrointestinal:  Negative for diarrhea, nausea and vomiting.  Neurological:  Negative for dizziness and seizures.  Psychiatric/Behavioral:  Positive for depression and suicidal ideas. Negative for hallucinations and memory loss. The patient is nervous/anxious and has insomnia.    Blood pressure 125/80, pulse 79, temperature 98 F (36.7 C), temperature source Oral, resp. rate 18, height 5\' 11"  (1.803 m), weight 87 kg, SpO2 99 %. Body mass index is 26.75 kg/m.  Continue to recommend inpatient psychiatric treatment. CSW has faced patient out other facilities.    Olanzapine 5mg  po daily for psychosis Escitalopram 10 mg po daily for mood     Problem 1: Suicidal ideations   Problem 2: MDD  , NP 01/17/2022, 5:42 PM

## 2022-01-18 ENCOUNTER — Other Ambulatory Visit: Payer: Self-pay

## 2022-01-18 DIAGNOSIS — F323 Major depressive disorder, single episode, severe with psychotic features: Secondary | ICD-10-CM

## 2022-01-18 MED ORDER — ESCITALOPRAM OXALATE 10 MG PO TABS
10.0000 mg | ORAL_TABLET | Freq: Every day | ORAL | 0 refills | Status: DC
  Filled 2022-01-18: qty 30, 30d supply, fill #0

## 2022-01-18 MED ORDER — OLANZAPINE 5 MG PO TBDP
5.0000 mg | ORAL_TABLET | Freq: Every day | ORAL | 0 refills | Status: DC
  Filled 2022-01-18: qty 30, 30d supply, fill #0

## 2022-01-18 NOTE — BH Assessment (Signed)
[facility] Assessment Progress Note   Per Nira Conn, Np, this voluntary pt does not require psychiatric hospitalization at this time.  Pt is psychiatrically cleared.  Discharge instructions include referral information for [facility].  EDP Linwood Dibbles, MD and pt's nurse, Larita Fife, have been notified.  Doylene Canning, MA Triage Specialist [phone]

## 2022-01-18 NOTE — ED Provider Notes (Signed)
Emergency Medicine Observation Re-evaluation Note  BUCK MCAFFEE is a 41 y.o. male, seen on rounds today.  Pt initially presented to the ED for complaints of Psychiatric Evaluation Currently, the patient is resting.  Physical Exam  BP 118/65 (BP Location: Left Arm)   Pulse 66   Temp 97.8 F (36.6 C) (Oral)   Resp 16   Ht 1.803 m (5\' 11" )   Wt 87 kg   SpO2 94%   BMI 26.75 kg/m  Physical Exam General: Calm, cooperative Cardiac: Regular rate Lungs: easily Psych: Normal mood  ED Course / MDM  EKG:   I have reviewed the labs performed to date as well as medications administered while in observation.  Recent changes in the last 24 hours include no issues.  Plan  Current plan is for inpatient.  [facility] is not under involuntary commitment.     Hessie Knows, MD 01/18/22 [phone]

## 2022-01-18 NOTE — Discharge Summary (Signed)
[facility] Psych ED Discharge  01/18/2022 11:00 AM WILFRED DAYRIT  MRN:  [id number]  Principal Problem: Amphetamine-induced mood disorder [facility]) Discharge Diagnoses: Principal Problem:   Amphetamine-induced mood disorder (HCC) Active Problems:   Suicidal ideations   Major depressive disorder with psychotic features [facility])  Clinical Impression:  Final diagnoses:  Agitation  Substance induced mood disorder (HCC)   Archibald D. Johnson "Magnus Sinning" is a 41 year old male with a history of methamphetamine induced psychotic disorder, HIV, Chronic Hep C, Seizure disorder, MDD with psychotic features who presented voluntarily and unaccompanied to [facility] on 01/14/2022 due to suicidal ideations.    On evaluation patient is alert and oriented x 4, pleasant, and cooperative. Speech is clear and coherent. Mood is depressed and affect is congruent with mood. Thought process is coherent and thought content is logical. Denies auditory and visual hallucinations. No indication that patient is responding to internal stimuli. Denies suicidal ideations. Denies homicidal ideations. Reports that he has been sober from all substances for 3 months. UDS positive for amphetamines on 01/13/2022 and 12/11/21.   [facility] is familiar with pt as he's been evaluated in the [facility] >12 x within the past 9 months with similar presentation, methamphetamine use disorder, psychosis, suicidal ideations.  No recent psychiatric admissions.  Usually after rest and restarting medications, pt improves, contracts for safety and is appropriate for discharge.  He's received outpatient resources but once discharged usually does not follow-up.   ED Assessment Time Calculation: Start Time: 0915 Stop Time: 0930 Total Time in Minutes (Assessment Completion): 15   Past Psychiatric History: [facility] is familiar with pt as he's been evaluated in the [facility] >12 x within the past 9 months with similar presentation, methamphetamine use disorder,  psychosis, suicidal ideations.  No recent psychiatric admissions.  Usually after rest and restarting medications, pt improves, contracts for safety and is appropriate for discharge.  He's received outpatient resources but once discharged usually does not follow-up.   Past Medical History:  Past Medical History:  Diagnosis Date   Chronic hepatitis C without hepatic coma (HCC) 08/17/2017   Endocarditis    Hepatitis C    History of syphilis 08/25/2014   Treated by [facility] 07-2014.   HIV (human immunodeficiency virus infection) (HCC)    HIV (human immunodeficiency virus infection) (HCC)    Seizures (HCC)     Past Surgical History:  Procedure Laterality Date   hand surgery Right    LUMBAR PUNCTURE  08/08/2019       WRIST SURGERY     Family History:  Family History  Adopted: Yes  Problem Relation Age of Onset   Diabetes type II Mother     Social History:  Social History   Substance and Sexual Activity  Alcohol Use Not Currently   Comment: in rehab     Social History   Substance and Sexual Activity  Drug Use Not Currently   Types: Marijuana, Methamphetamines   Comment: states last meth use was 08/2021    Social History   Socioeconomic History   Marital status: Single    Spouse name: Not on file   Number of children: Not on file   Years of education: Not on file   Highest education level: Not on file  Occupational History   Not on file  Tobacco Use   Smoking status: Former    Types: Cigarettes   Smokeless tobacco: Never  Vaping Use   Vaping Use: Never used  Substance and Sexual Activity  Alcohol use: Not Currently    Comment: in rehab   Drug use: Not Currently    Types: Marijuana, Methamphetamines    Comment: states last meth use was 08/2021   Sexual activity: Yes    Partners: Male    Birth control/protection: Condom    Comment: given condoms  Other Topics Concern   Not on file  Social History Narrative   ** Merged History Encounter **       ** Merged History  Encounter **       ** Merged History Encounter **       Social Determinants of Corporate investment banker Strain: Not on file  Food Insecurity: Not on file  Transportation Needs: Not on file  Physical Activity: Not on file  Stress: Not on file  Social Connections: Not on file    Tobacco Cessation:  A prescription for an FDA-approved tobacco cessation medication was offered at discharge and the patient refused  Current Medications: Current Facility-Administered Medications  Medication Dose Route Frequency Provider Last Rate Last Admin   bictegravir-emtricitabine-tenofovir AF (BIKTARVY) 50-200-25 MG per tablet 1 tablet  1 tablet Oral Daily Charlynne Pander, MD   1 tablet at 01/17/22 0938   escitalopram (LEXAPRO) tablet 10 mg  10 mg Oral Daily Ophelia Shoulder E, NP   10 mg at 01/18/22 0951   levETIRAcetam (KEPPRA) tablet 1,250 mg  1,250 mg Oral BID Charlynne Pander, MD   1,250 mg at 01/18/22 0951   OLANZapine zydis (ZYPREXA) disintegrating tablet 5 mg  5 mg Oral QHS Charlynne Pander, MD   5 mg at 01/17/22 2114   Current Outpatient Medications  Medication Sig Dispense Refill   bictegravir-emtricitabine-tenofovir AF (BIKTARVY) 50-200-25 MG TABS tablet Take 1 tablet by mouth daily. 30 tablet 0   levETIRAcetam (KEPPRA) 250 MG tablet Take 5 tablets (1,250 mg total) by mouth 2 (two) times daily. 300 tablet 0   [START ON 01/19/2022] escitalopram (LEXAPRO) 10 MG tablet Take 1 tablet (10 mg total) by mouth daily. 30 tablet 0   OLANZapine zydis (ZYPREXA) 5 MG disintegrating tablet Take 1 tablet (5 mg total) by mouth at bedtime. 30 tablet 0   PTA Medications: (Not in a hospital admission)   [place] Scale:  Flowsheet Row ED from 01/14/2022 in [facility] Most recent reading at 01/14/2022 11:23 PM ED from 01/14/2022 in [facility] EMERGENCY DEPARTMENT Most recent reading at 01/14/2022 12:14 PM ED from 01/13/2022 in [facility]  EMERGENCY DEPARTMENT Most recent reading at 01/13/2022  3:57 AM  C-SSRS RISK CATEGORY High Risk High Risk No Risk       Musculoskeletal: Strength & Muscle Tone: within normal limits Gait & Station: normal Patient leans: N/A  Psychiatric Specialty Exam: Presentation  General Appearance: Appropriate for Environment; Casual  Eye Contact:Good  Speech:Clear and Coherent; Normal Rate  Speech Volume:Normal  Handedness:Right   Mood and Affect  Mood:Depressed  Affect:Appropriate   Thought Process  Thought Processes:Coherent; Goal Directed; Linear  Descriptions of Associations:Intact  Orientation:Full (Time, Place and Person)  Thought Content:Logical  History of Schizophrenia/Schizoaffective disorder:Yes  Duration of Psychotic Symptoms:Greater than six months  Hallucinations:Hallucinations: None  Ideas of Reference:None  Suicidal Thoughts:Suicidal Thoughts: No  Homicidal Thoughts:Homicidal Thoughts: No   Sensorium  Memory:Immediate Good; Recent Good; Remote Good  Judgment:Intact  Insight:Present   Executive Functions  Concentration:Good  Attention Span:Good  Recall:Good  Fund of Knowledge:Good  Language:Good   Psychomotor Activity  Psychomotor Activity:Psychomotor Activity:  Normal   Assets  Assets:Communication Skills; Desire for Improvement; Physical Health   Sleep  Sleep:Sleep: Good    Physical Exam: Physical Exam Constitutional:      General: He is not in acute distress.    Appearance: He is not ill-appearing, toxic-appearing or diaphoretic.  HENT:     Right Ear: External ear normal.     Left Ear: External ear normal.  Eyes:     Pupils: Pupils are equal, round, and reactive to light.  Cardiovascular:     Rate and Rhythm: Normal rate.  Pulmonary:     Effort: Pulmonary effort is normal. No respiratory distress.  Musculoskeletal:        General: Normal range of motion.  Neurological:     Mental Status: He is alert and  oriented to person, place, and time.  Psychiatric:        Thought Content: Thought content is not paranoid or delusional. Thought content does not include homicidal or suicidal ideation.    Review of Systems  Constitutional:  Negative for chills, diaphoresis, fever, malaise/fatigue and weight loss.  Cardiovascular:  Negative for chest pain and palpitations.  Gastrointestinal:  Negative for diarrhea, nausea and vomiting.  Neurological:  Negative for dizziness and seizures.  Psychiatric/Behavioral:  Negative for hallucinations, memory loss, substance abuse and suicidal ideas.    Blood pressure 99/65, pulse 82, temperature 98.1 F (36.7 C), temperature source Oral, resp. rate 16, height 5\' 11"  (1.803 m), weight 87 kg, SpO2 100 %. Body mass index is 26.75 kg/m.   Demographic Factors:  Male  Loss Factors: Financial problems/change in socioeconomic status  Historical Factors: Prior suicide attempts, Family history of mental illness or substance abuse, and Impulsivity  Risk Reduction Factors:   Religious beliefs about death  Continued Clinical Symptoms:  Alcohol/Substance Abuse/Dependencies Unstable or Poor Therapeutic Relationship  Cognitive Features That Contribute To Risk:  Closed-mindedness    Suicide Risk:  Mild:  Suicidal ideation of limited frequency, intensity, duration, and specificity.  There are no identifiable plans, no associated intent, mild dysphoria and related symptoms, good self-control (both objective and subjective assessment), few other risk factors, and identifiable protective factors, including available and accessible social support.    Medical Decision Making: At time of discharge, patient denies SI, HI, AVH and is able to contract for safety. He demonstrated no overt evidence of psychosis or mania. Prior to discharge the patient verbalized understanding of warning signs, triggers, and symptoms of worsening mental health and how to access emergency mental  health care if they felt it was needed. Patient was instructed to call 911 or return to the emergency room if they experienced any concerning symptoms after discharge. Patient voiced understanding and agreed to this.  Acute stabilization is essential to patient safety, but must be carried out in a way will not violate autonomy, justice, and non-maleficence. At the time of discharged it was determined that this patient had maximized the possible benefits of psychiatric hospitalization.   Discussed benefits, risks and side effect profile of medication/s with the patient. They verbalized willingness to take the medication/s as prescribed. Counseled the patient on the importance of medication compliance and to not discontinue medications without medical advice as withdrawal symptoms and/ or worsening of symptoms may occur.     Prescriptions for lexapro 10 mg daily and zyprexa 5 mg QHS sent to pharmacy of choice.     Discharge Instructions      For your behavioral health needs you are advised to follow up with  [facility] at your earliest opportunity:      [facility]      [address] [place], [place] [address]      [phone]      They offer psychiatry/medication management and therapy.  New patients are seen in their walk-in clinic.  Walk-in hours are Monday, Wednesday, Thursday and Friday from 8:00 am - 11:00 am for psychiatry, and Monday and Wednesday from 8:00 am - 11:00 am for therapy.  Walk-in patients are seen on a first come, first served basis, so try to arrive as early as possible for the best chance of being seen the same day.  BE SURE TO TAKE THE ELEVATOR TO THE SECOND FLOOR.  Please note that to be eligible for services you must bring an ID or a piece of mail with your name and a [facility] address.        Jackelyn Poling, NP 01/18/2022, 11:00 AM

## 2022-01-18 NOTE — Discharge Instructions (Signed)
For your behavioral health needs you are advised to follow up with [facility] at your earliest opportunity:      [facility]      [address] SECOND FLO[address]      , [place] [address]      [phone]      They offer psychiatry/medication management and therapy.  New patients are seen in their walk-in clinic.  Walk-in hours are Monday, Wednesday, Thursday and Friday from 8:00 am - 11:00 am for psychiatry, and Monday and Wednesday from 8:00 am - 11:00 am for therapy.  Walk-in patients are seen on a first come, first served basis, so try to arrive as early as possible for the best chance of being seen the same day.  BE SURE TO TAKE THE ELEVATOR TO THE SECOND FLOOR.  Please note that to be eligible for services you must bring an ID or a piece of mail with your name and a [place] address.  

## 2022-01-20 ENCOUNTER — Other Ambulatory Visit: Payer: Self-pay | Admitting: Internal Medicine

## 2022-01-20 ENCOUNTER — Telehealth: Payer: Self-pay

## 2022-01-20 ENCOUNTER — Other Ambulatory Visit: Payer: Self-pay

## 2022-01-20 MED ORDER — ESCITALOPRAM OXALATE 10 MG PO TABS: 10.0000 mg | ORAL_TABLET | Freq: Every day | ORAL | 0 refills | Status: DC

## 2022-01-20 MED ORDER — OLANZAPINE 5 MG PO TBDP
5.0000 mg | ORAL_TABLET | Freq: Every day | ORAL | 5 refills | Status: DC
Start: 2022-01-20 — End: 2022-08-09

## 2022-01-20 NOTE — Telephone Encounter (Signed)
Justin [name], Case manager with [facility] reached out to office to see if provider would be okay sending new prescription for Lexapro 10 mg and Zyprexa 5 mg  to [facility] on [address].  Patient prescribed this by emergency room provider. Will forward message to MD to advise on refill. Requested patient signup for mychart to communicate with office. Juanita Laster, RMA

## 2022-01-22 ENCOUNTER — Emergency Department (HOSPITAL_COMMUNITY)
Admission: EM | Admit: 2022-01-22 | Discharge: 2022-01-23 | Disposition: A | Discharge status: Home / Self Care | Payer: Self-pay | Attending: Emergency Medicine | Admitting: Emergency Medicine

## 2022-01-22 ENCOUNTER — Encounter (HOSPITAL_COMMUNITY): Payer: Self-pay | Admitting: Emergency Medicine

## 2022-01-22 ENCOUNTER — Other Ambulatory Visit: Payer: Self-pay

## 2022-01-22 DIAGNOSIS — R519 Headache, unspecified: Secondary | ICD-10-CM | POA: Insufficient documentation

## 2022-01-22 DIAGNOSIS — Z5321 Procedure and treatment not carried out due to patient leaving prior to being seen by health care provider: Secondary | ICD-10-CM | POA: Insufficient documentation

## 2022-01-22 NOTE — ED Provider Triage Note (Signed)
Emergency Medicine Provider Triage Evaluation Note  Justin [name] , a 41 y.o. male  was evaluated in triage.  Pt complains of concern he may have a seizure. Reports a mild headache which is typically what he gets before having a seizure. Came to the ER to be monitored until after he takes his seizure medicine at 8pm. Last seizure was last week, has been compliant with meds, no missed doses.  Review of Systems  Positive: headache Negative:   Physical Exam  There were no vitals taken for this visit. Gen:   Awake, no distress   Resp:  Normal effort  MSK:   Moves extremities without difficulty  Other:    Medical Decision Making  Medically screening exam initiated at 7:23 PM.  Appropriate orders placed.  Justin [name] was informed that the remainder of the evaluation will be completed by another provider, this initial triage assessment does not replace that evaluation, and the importance of remaining in the ED until their evaluation is complete.     Jeannie Fend, PA-C 01/22/22 1924

## 2022-01-22 NOTE — ED Triage Notes (Signed)
Patient reports headache this evening , no head injury , denies emesis or fever .

## 2022-01-25 ENCOUNTER — Other Ambulatory Visit: Payer: Self-pay

## 2022-01-31 ENCOUNTER — Telehealth: Payer: Self-pay

## 2022-01-31 NOTE — Telephone Encounter (Signed)
Received fax form [facility] requesting medical records. Patient is transferring care. Will send last office note, immunization records,  labs. Will update [facility]. P: [phone] F: [phone] Juanita Laster, RMA

## 2022-02-04 ENCOUNTER — Ambulatory Visit: Payer: Self-pay | Admitting: Pharmacist

## 2022-02-04 ENCOUNTER — Ambulatory Visit: Payer: Self-pay | Admitting: Internal Medicine

## 2022-02-04 ENCOUNTER — Ambulatory Visit: Payer: Self-pay

## 2022-02-12 IMAGING — CR DG FOREARM 2V*R*
2 series · 2 of 2 positions shown · non-contrast
Comparison: None.

CLINICAL DATA: Posttraumatic right upper extremity pain.

EXAM:
RIGHT FOREARM - 2 VIEW

[forearm ap]
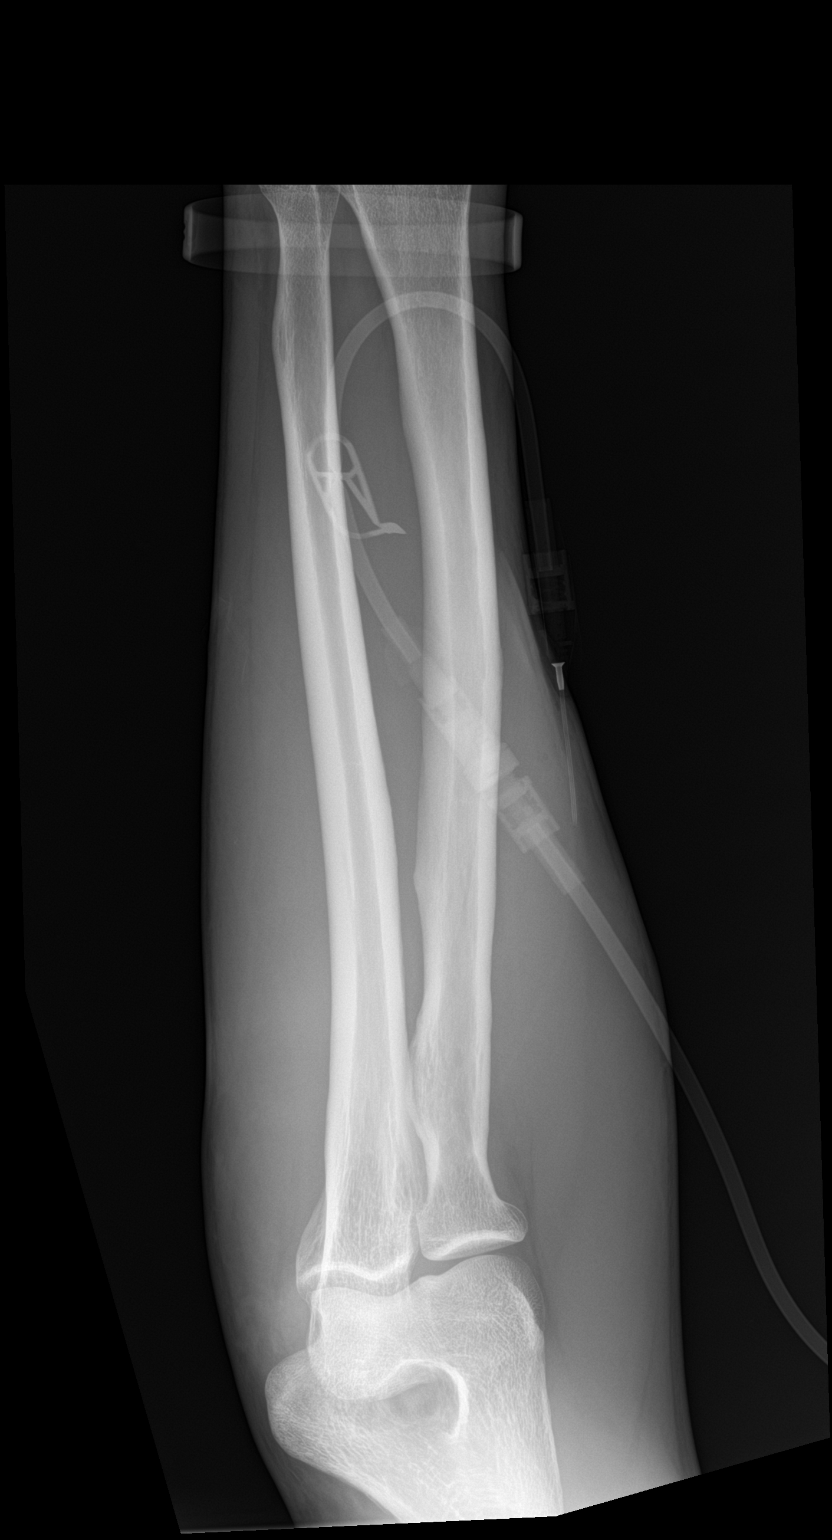

[forearm lat]
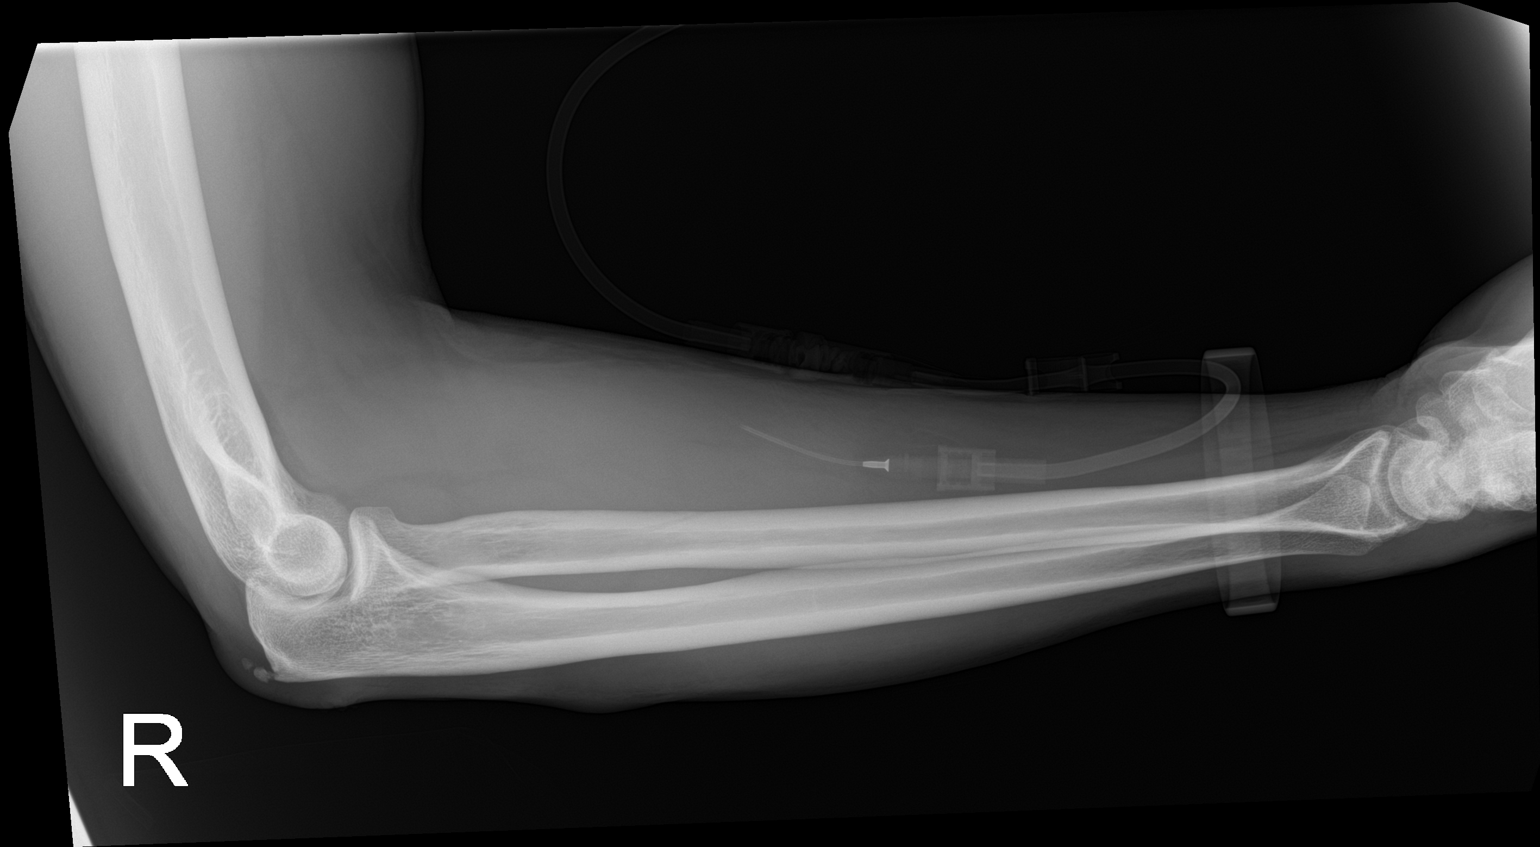

[2 of 2 positions shown; findings below may reference images not displayed]

FINDINGS: There is no evidence of fracture or other focal bone lesions. Soft
tissues are unremarkable.
IMPRESSION: Negative.

## 2022-02-17 ENCOUNTER — Other Ambulatory Visit: Payer: Self-pay | Admitting: Internal Medicine

## 2022-03-18 LAB — LAB REPORT - SCANNED
EGFR: 75
HM Hepatitis Screen: POSITIVE

## 2022-07-24 ENCOUNTER — Encounter (HOSPITAL_COMMUNITY): Payer: Self-pay | Admitting: Pharmacy Technician

## 2022-07-24 ENCOUNTER — Encounter (HOSPITAL_COMMUNITY): Payer: Self-pay | Admitting: Emergency Medicine

## 2022-07-24 ENCOUNTER — Emergency Department (HOSPITAL_COMMUNITY)
Admission: EM | Admit: 2022-07-24 | Discharge: 2022-07-24 | Disposition: A | Discharge status: Home / Self Care | Payer: Self-pay | Attending: Emergency Medicine | Admitting: Emergency Medicine

## 2022-07-24 ENCOUNTER — Emergency Department (HOSPITAL_COMMUNITY)
Admission: EM | Admit: 2022-07-24 | Discharge: 2022-07-25 | Disposition: A | Discharge status: Home / Self Care | Payer: Self-pay | Attending: Emergency Medicine | Admitting: Emergency Medicine

## 2022-07-24 ENCOUNTER — Other Ambulatory Visit: Payer: Self-pay

## 2022-07-24 ENCOUNTER — Emergency Department (HOSPITAL_COMMUNITY)
Admission: EM | Admit: 2022-07-24 | Discharge: 2022-07-24 | Discharge status: Against Medical Advice | Payer: Self-pay | Attending: Emergency Medicine | Admitting: Emergency Medicine

## 2022-07-24 ENCOUNTER — Ambulatory Visit (HOSPITAL_COMMUNITY)
Admission: EM | Admit: 2022-07-24 | Discharge: 2022-07-24 | Disposition: A | Discharge status: Home / Self Care | Payer: Self-pay

## 2022-07-24 DIAGNOSIS — Z87891 Personal history of nicotine dependence: Secondary | ICD-10-CM | POA: Insufficient documentation

## 2022-07-24 DIAGNOSIS — Z21 Asymptomatic human immunodeficiency virus [HIV] infection status: Secondary | ICD-10-CM | POA: Insufficient documentation

## 2022-07-24 DIAGNOSIS — R441 Visual hallucinations: Secondary | ICD-10-CM | POA: Insufficient documentation

## 2022-07-24 DIAGNOSIS — Z1152 Encounter for screening for COVID-19: Secondary | ICD-10-CM | POA: Insufficient documentation

## 2022-07-24 DIAGNOSIS — R443 Hallucinations, unspecified: Secondary | ICD-10-CM

## 2022-07-24 DIAGNOSIS — Z5321 Procedure and treatment not carried out due to patient leaving prior to being seen by health care provider: Secondary | ICD-10-CM | POA: Insufficient documentation

## 2022-07-24 DIAGNOSIS — R440 Auditory hallucinations: Secondary | ICD-10-CM | POA: Insufficient documentation

## 2022-07-24 DIAGNOSIS — R569 Unspecified convulsions: Secondary | ICD-10-CM | POA: Insufficient documentation

## 2022-07-24 LAB — COMPREHENSIVE METABOLIC PANEL
ALT: 32 U/L (ref 0–44)
AST: 29 U/L (ref 15–41)
Albumin: 3.7 g/dL (ref 3.5–5.0)
Alkaline Phosphatase: 69 U/L (ref 38–126)
Anion gap: 8 (ref 5–15)
BUN: 10 mg/dL (ref 6–20)
CO2: 23 mmol/L (ref 22–32)
Calcium: 8.7 mg/dL — ABNORMAL LOW (ref 8.9–10.3)
Chloride: 103 mmol/L (ref 98–111)
Creatinine, Ser: 1.07 mg/dL (ref 0.61–1.24)
GFR, Estimated: >60 mL/min (ref >60)
Glucose, Bld: 89 mg/dL (ref 70–99)
Potassium: 3.7 mmol/L (ref 3.5–5.1)
Sodium: 134 mmol/L — ABNORMAL LOW (ref 135–145)
Total Bilirubin: 1.2 mg/dL (ref 0.3–1.2)
Total Protein: 6.5 g/dL (ref 6.5–8.1)

## 2022-07-24 LAB — CBC WITH DIFFERENTIAL/PLATELET
Abs Immature Granulocytes: 0.02 10*3/uL (ref 0.00–0.07)
Basophils Absolute: 0 10*3/uL (ref 0.0–0.1)
Basophils Relative: 0 %
Eosinophils Absolute: 0 10*3/uL (ref 0.0–0.5)
Eosinophils Relative: 1 %
HCT: 46.5 % (ref 39.0–52.0)
Hemoglobin: 15.7 g/dL (ref 13.0–17.0)
Immature Granulocytes: 0 %
Lymphocytes Relative: 39 %
Lymphs Abs: 2.3 10*3/uL (ref 0.7–4.0)
MCH: 33.3 pg (ref 26.0–34.0)
MCHC: 33.8 g/dL (ref 30.0–36.0)
MCV: 98.5 fL (ref 80.0–100.0)
Monocytes Absolute: 0.6 10*3/uL (ref 0.1–1.0)
Monocytes Relative: 9 %
Neutro Abs: 3 10*3/uL (ref 1.7–7.7)
Neutrophils Relative %: 51 %
Platelets: 170 10*3/uL (ref 150–400)
RBC: 4.72 MIL/uL (ref 4.22–5.81)
RDW: 11.9 % (ref 11.5–15.5)
WBC: 5.9 10*3/uL (ref 4.0–10.5)
nRBC: 0 % (ref 0.0–0.2)

## 2022-07-24 LAB — RESP PANEL BY RT-PCR (RSV, FLU A&B, COVID)  RVPGX2
Influenza A by PCR: NEGATIVE
Influenza B by PCR: NEGATIVE
Resp Syncytial Virus by PCR: NEGATIVE
SARS Coronavirus 2 by RT PCR: NEGATIVE

## 2022-07-24 LAB — ETHANOL: Alcohol, Ethyl (B): <10 mg/dL (ref <10)

## 2022-07-24 LAB — RAPID URINE DRUG SCREEN, HOSP PERFORMED
Amphetamines: NOT DETECTED
Barbiturates: NOT DETECTED
Benzodiazepines: POSITIVE — AB
Cocaine: NOT DETECTED
Opiates: NOT DETECTED
Tetrahydrocannabinol: NOT DETECTED

## 2022-07-24 MED ORDER — MIDAZOLAM HCL 2 MG/2ML IJ SOLN
2.0000 mg | Freq: Once | INTRAMUSCULAR | Status: AC
  Administered 2022-07-24: 2 mg via INTRAVENOUS
  Filled 2022-07-24: qty 2

## 2022-07-24 MED ORDER — LORAZEPAM 1 MG PO TABS
1.0000 mg | ORAL_TABLET | Freq: Four times a day (QID) | ORAL | Status: DC | PRN
  Administered 2022-07-24: 1 mg via ORAL
  Filled 2022-07-24: qty 1

## 2022-07-24 MED ORDER — LORAZEPAM 1 MG PO TABS
2.0000 mg | ORAL_TABLET | Freq: Once | ORAL | Status: AC
  Administered 2022-07-24: 2 mg via ORAL
  Filled 2022-07-24: qty 2

## 2022-07-24 MED ORDER — LEVETIRACETAM IN NACL 1500 MG/100ML IV SOLN
1500.0000 mg | Freq: Once | INTRAVENOUS | Status: AC
  Administered 2022-07-24: 1500 mg via INTRAVENOUS
  Filled 2022-07-24: qty 100

## 2022-07-24 MED ORDER — MIDAZOLAM HCL 2 MG/2ML IJ SOLN: 2.0000 mg | Freq: Once | INTRAMUSCULAR | Status: DC

## 2022-07-24 NOTE — ED Provider Notes (Signed)
[facility]-EMERGENCY DEPT Provider Note: Justin Dell, MD, FACEP  CSN: [id number] MRN: [id number] ARRIVAL: 07/24/22 at 2228 ROOM: WA12/WA12   CHIEF COMPLAINT  Seizures   HISTORY OF PRESENT ILLNESS  07/24/22 11:13 PM Justin [name] is a 42 y.o. male who is on Biktarvy for HIV and Keppra 1500 mg twice daily for seizures.  He is also on Zyprexa.  For about the past 4 months he has been hearing voices.  The voices tell him to hurt other people which she knows is not a good thing to do and he has no intentions of hurting himself or others but the voices are concerning him.  He has also had an increase in frequency of seizures, most recently at [facility] earlier this evening.  He is concerned the Zyprexa may be triggering his seizures.  He is pleasant and cooperative.  He denies drug use.   Past Medical History:  Diagnosis Date   Chronic hepatitis C without hepatic coma (HCC) 08/17/2017   Endocarditis    History of syphilis 08/25/2014   Treated by [facility] 07-2014.   HIV (human immunodeficiency virus infection) (HCC)    Seizures (HCC)     Past Surgical History:  Procedure Laterality Date   hand surgery Right    LUMBAR PUNCTURE  08/08/2019       WRIST SURGERY      Family History  Adopted: Yes  Problem Relation Age of Onset   Diabetes type II Mother     Social History   Tobacco Use   Smoking status: Former    Types: Cigarettes   Smokeless tobacco: Never  Vaping Use   Vaping Use: Never used  Substance Use Topics   Alcohol use: Not Currently    Comment: in rehab   Drug use: Not Currently    Types: Marijuana, Methamphetamines    Comment: states last meth use was 08/2021    Prior to Admission medications   Medication Sig Start Date End Date Taking? Authorizing Provider  bictegravir-emtricitabine-tenofovir AF (BIKTARVY) 50-200-25 MG TABS tablet Take 1 tablet by mouth daily. 01/13/22  Yes Dartha Lodge, PA-C  levETIRAcetam (KEPPRA) 750 MG tablet Take 1,500 mg by mouth 2  (two) times daily.   Yes [provider]  OLANZapine zydis (ZYPREXA) 5 MG disintegrating tablet Take 1 tablet (5 mg total) by mouth at bedtime. 01/20/22  Yes Comer, Belia Heman, MD  escitalopram (LEXAPRO) 10 MG tablet Take 1 tablet (10 mg total) by mouth daily. Patient not taking: Reported on 07/24/2022 01/20/22   [name], MD  levETIRAcetam (KEPPRA) 250 MG tablet Take 5 tablets (1,250 mg total) by mouth 2 (two) times daily. 01/13/22 02/12/22  Dartha Lodge, PA-C    Allergies Patient has no known allergies.   REVIEW OF SYSTEMS  Negative except as noted here or in the History of Present Illness.   PHYSICAL EXAMINATION  Initial Vital Signs Blood pressure (!) 149/100, pulse 84, temperature 97.8 F (36.6 C), temperature source Oral, resp. rate 16, SpO2 97 %.  Examination General: Well-developed, well-nourished male in no acute distress; appearance consistent with age of record HENT: normocephalic; atraumatic Eyes: Normal appearance Neck: supple Heart: regular rate and rhythm Lungs: clear to auscultation bilaterally Abdomen: soft; nondistended; nontender; bowel sounds present Extremities: No deformity; full range of motion Neurologic: Awake, alert and oriented; motor function intact in all extremities and symmetric; no facial droop Skin: Warm and dry Psychiatric: Normal mood and affect; no SI; no HI; good insight  RESULTS  Summary of this visit's results, reviewed and interpreted by myself:   EKG Interpretation  Date/Time:    Ventricular Rate:    PR Interval:    QRS Duration:   QT Interval:    QTC Calculation:   R Axis:     Text Interpretation:         Laboratory Studies: Results for orders placed or performed during the hospital encounter of 07/24/22 (from the past 24 hour(s))  Comprehensive metabolic panel     Status: Abnormal   Collection Time: 07/24/22 12:33 PM  Result Value Ref Range   Sodium 134 (L) 135 - 145 mmol/L   Potassium 3.7 3.5 - 5.1 mmol/L    Chloride 103 98 - 111 mmol/L   CO2 23 22 - 32 mmol/L   Glucose, Bld 89 70 - 99 mg/dL   BUN 10 6 - 20 mg/dL   Creatinine, Ser 1.07 0.61 - 1.24 mg/dL   Calcium 8.7 (L) 8.9 - 10.3 mg/dL   Total Protein 6.5 6.5 - 8.1 g/dL   Albumin 3.7 3.5 - 5.0 g/dL   AST 29 15 - 41 U/L   ALT 32 0 - 44 U/L   Alkaline Phosphatase 69 38 - 126 U/L   Total Bilirubin 1.2 0.3 - 1.2 mg/dL   GFR, Estimated >60 >60 mL/min   Anion gap 8 5 - 15  Ethanol     Status: None   Collection Time: 07/24/22 12:33 PM  Result Value Ref Range   Alcohol, Ethyl (B) <10 <10 mg/dL  Urine rapid drug screen (hosp performed)     Status: Abnormal   Collection Time: 07/24/22 12:33 PM  Result Value Ref Range   Opiates NONE DETECTED NONE DETECTED   Cocaine NONE DETECTED NONE DETECTED   Benzodiazepines POSITIVE (A) NONE DETECTED   Amphetamines NONE DETECTED NONE DETECTED   Tetrahydrocannabinol NONE DETECTED NONE DETECTED   Barbiturates NONE DETECTED NONE DETECTED  CBC with Diff     Status: None   Collection Time: 07/24/22 12:33 PM  Result Value Ref Range   WBC 5.9 4.0 - 10.5 K/uL   RBC 4.72 4.22 - 5.81 MIL/uL   Hemoglobin 15.7 13.0 - 17.0 g/dL   HCT 46.5 39.0 - 52.0 %   MCV 98.5 80.0 - 100.0 fL   MCH 33.3 26.0 - 34.0 pg   MCHC 33.8 30.0 - 36.0 g/dL   RDW 11.9 11.5 - 15.5 %   Platelets 170 150 - 400 K/uL   nRBC 0.0 0.0 - 0.2 %   Neutrophils Relative % 51 %   Neutro Abs 3.0 1.7 - 7.7 K/uL   Lymphocytes Relative 39 %   Lymphs Abs 2.3 0.7 - 4.0 K/uL   Monocytes Relative 9 %   Monocytes Absolute 0.6 0.1 - 1.0 K/uL   Eosinophils Relative 1 %   Eosinophils Absolute 0.0 0.0 - 0.5 K/uL   Basophils Relative 0 %   Basophils Absolute 0.0 0.0 - 0.1 K/uL   Immature Granulocytes 0 %   Abs Immature Granulocytes 0.02 0.00 - 0.07 K/uL  Resp panel by RT-PCR (RSV, Flu A&B, Covid) Anterior Nasal Swab     Status: None   Collection Time: 07/24/22 12:39 PM   Specimen: Anterior Nasal Swab  Result Value Ref Range   SARS Coronavirus 2 by  RT PCR NEGATIVE NEGATIVE   Influenza A by PCR NEGATIVE NEGATIVE   Influenza B by PCR NEGATIVE NEGATIVE   Resp Syncytial Virus by PCR NEGATIVE NEGATIVE   Imaging  Studies: No results found.  ED COURSE and MDM  Nursing notes, initial and subsequent vitals signs, including pulse oximetry, reviewed and interpreted by myself.  Vitals:   07/24/22 2238 07/25/22 0230  BP: (!) 149/100 120/82  Pulse: 84 84  Resp: 16 16  Temp: 97.8 F (36.6 C)   TempSrc: Oral   SpO2: 97% 95%   Medications  bictegravir-emtricitabine-tenofovir AF (BIKTARVY) 50-200-25 MG per tablet 1 tablet (has no administration in time range)  OLANZapine zydis (ZYPREXA) disintegrating tablet 5 mg (5 mg Oral Given 07/25/22 0123)  levETIRAcetam (KEPPRA) tablet 1,500 mg (1,500 mg Oral Given 07/25/22 0123)  LORazepam (ATIVAN) tablet 2 mg (2 mg Oral Given 07/24/22 2348)    The patient is not displaying any behavior that would suggest she is a threat to himself or others.  He is here voluntarily.  We will have TTS assess him.  He may need to change his medications as Zyprexa may be lowering his seizure threshold.  2:51 AM TTS has assessed patient and deemed him not a candidate for inpatient psychiatric care.  They would like to refer him to the [facility] for medication management.  PROCEDURES  Procedures   ED DIAGNOSES     ICD-10-CM   1. Seizures ([place])  R56.9     2. Auditory hallucinations  R44.0          Hetvi Shawhan, Jenny Reichmann, MD 07/25/22 [phone]

## 2022-07-24 NOTE — ED Provider Notes (Signed)
This provider was called to the bedside after patient had witnessed seizure-like activity.  Patient was coherent but with some concern for psychiatric behavior earlier today.  Patient is on [place] for history of epilepsy.  He is also HIV positive on Biktarvy.  He denies any missed doses.  He has been overall appropriate and his evaluation so far.  As I am called to bedside patient is no longer seizing, but agitated, postictal, he is able to be calmly lower to the bed, will administer IM Versed as well as IV Keppra to ensure no recurrence of seizure, will recheck in a few minutes to see how patient has responded to treatment.  Patient responded well to IV Versed, and antiepileptic drugs.  He is cleared again for psychiatric evaluation at this time.   Justin [name] 07/24/22 1937    Hayden Rasmussen, MD 07/25/22 1038

## 2022-07-24 NOTE — ED Notes (Signed)
Pt reported that the last couple days he has been having visual and auditory hallucinations but cannot remember when this happens.  His loved ones recorded him as he was having this "episode" and he described that he was acting "unlike himself."  Pt currently does not report having hallucinations or homicidal/suicidal thoughts.  He also states to that others saw himself "eating cigarettes".  Pt cooperative at this time.  No new orders.

## 2022-07-24 NOTE — ED Notes (Signed)
Pt wanded and undressed.

## 2022-07-24 NOTE — ED Triage Notes (Signed)
Pt here for psychiatric evaluation. Pt states after starting on zyprexa he has started to hear voices telling him that his name is scott. However, he does not remember this happening. Friends told him that this happened. Also told him that he was aggressive, ate 3 cigarettes and tried to jump out of a car. Pt afraid that since he can not remember these events that he is a danger to himself or others. Denies active SI/HI.

## 2022-07-24 NOTE — ED Notes (Signed)
Patient expresses being really anxious confused and concern "did I harm anybody?". Patient is wanting to find out why he keeps losing his memory and expressed wanting help to "figure out what is going on". Patient explained about the next steps needed. NAD

## 2022-07-24 NOTE — ED Notes (Signed)
Pt stated he has not had any seizure medications today

## 2022-07-24 NOTE — ED Notes (Signed)
Patient jolted out of bed stating "I'm ready to go".

## 2022-07-24 NOTE — ED Provider Triage Note (Signed)
Emergency Medicine Provider Triage Evaluation Note  Justin [name] , a 42 y.o. male  was evaluated in triage.  Pt complains of mental health concern. Patient with history of HIV, anxiety, hep c, MDD. seizures. States that he is compliant with his medications. Reports hearing voices. He states that he does not feel safe, and is concerned that he may become aggressive towards others  Review of Systems  Positive: Flight of ideas, auditory hallucinations, foot pain Negative: Fever, chills, non-compliance with meds  Physical Exam  There were no vitals taken for this visit. Gen:   Awake, no distress   Resp:  Normal effort  MSK:   Moves extremities without difficulty. Mild skin excoriation between toes. No deformity.  Other:  Cooperative, not aggressive  Medical Decision Making  Medically screening exam initiated at 12:26 PM.  Appropriate orders placed.  Sheran Luz Hoggard was informed that the remainder of the evaluation will be completed by another provider, this initial triage assessment does not replace that evaluation, and the importance of remaining in the ED until their evaluation is complete.     Etta Quill, NP 07/24/22 1234

## 2022-07-24 NOTE — ED Notes (Signed)
Pt wanded by security. 

## 2022-07-24 NOTE — ED Notes (Signed)
Patient yelling and cursing at RN stating "I need a cigarette. I'm not threatening anyone you b---" while on the phone. RN gave belongings back. Security at bedside.

## 2022-07-24 NOTE — ED Triage Notes (Signed)
Informed by the front desk that Patient came in and states that he has a mental history of mixed personalities. Patient verbally informed the check in that he does not feel safe and no one around him is safe. Patient does not appear to be acting in an aggressive manor, Patient does not have any visible or verbally confirmed equipment on him according to the front desk.   Providers informed and advised to call security and have Patient transported to the emergency room.   Security has been called and escorting the Patient to the emergency room.

## 2022-07-24 NOTE — ED Provider Notes (Signed)
[facility] Provider Note   CSN: [id number] Arrival date & time: 07/24/22  1207     History  Chief Complaint  Patient presents with   Psychiatric Evaluation    Justin [name] is a 42 y.o. male.  HPI   Patient with medical history of epilepsy on Keppra, hepatitis C, HIV, substance use disorder, adjustment disorder, MDD presents to the emergency department due to hallucinations.  Patient states earlier this morning his family filled him because he does not remember the events.  States he was having visual hallucinations and responding to internal stimuli, eating cigarettes and behaving erratically.  Patient is on Zyprexa with no missed doses within but may be worsening his symptoms, he is been on it for about a year.  He is on Keppra and Biktarvy without any missed doses.  Denies any illicit drug use, alcohol use, active SI or HI.  States that although he does not have any intentions of hurting other people or himself and he is a "God fearing man" sometimes he is worried that the hallucinations will make him do something and he will not remember it.  Home Medications Prior to Admission medications   Medication Sig Start Date End Date Taking? Authorizing Provider  bictegravir-emtricitabine-tenofovir AF (BIKTARVY) 50-200-25 MG TABS tablet Take 1 tablet by mouth daily. 01/13/22   Jacqlyn Larsen, PA-C  escitalopram (LEXAPRO) 10 MG tablet Take 1 tablet (10 mg total) by mouth daily. 01/20/22   Thayer Headings, MD  levETIRAcetam (KEPPRA) 250 MG tablet Take 5 tablets (1,250 mg total) by mouth 2 (two) times daily. 01/13/22 02/12/22  Jacqlyn Larsen, PA-C  OLANZapine zydis (ZYPREXA) 5 MG disintegrating tablet Take 1 tablet (5 mg total) by mouth at bedtime. 01/20/22   Comer, Okey Regal, MD      Allergies    Patient has no known allergies.    Review of Systems   Review of Systems  Physical Exam Updated Vital Signs BP 107/87   Pulse 96   Temp 98.1 F  (36.7 C)   Resp 17   SpO2 99%  Physical Exam Vitals and nursing note reviewed. Exam conducted with a chaperone present.  Constitutional:      Appearance: Normal appearance.  HENT:     Head: Normocephalic and atraumatic.  Eyes:     General: No scleral icterus.       Right eye: No discharge.        Left eye: No discharge.     Extraocular Movements: Extraocular movements intact.     Pupils: Pupils are equal, round, and reactive to light.  Cardiovascular:     Rate and Rhythm: Normal rate and regular rhythm.     Pulses: Normal pulses.     Heart sounds: Normal heart sounds. No murmur heard.    No friction rub. No gallop.  Pulmonary:     Effort: Pulmonary effort is normal. No respiratory distress.     Breath sounds: Normal breath sounds.  Abdominal:     General: Abdomen is flat. Bowel sounds are normal. There is no distension.     Palpations: Abdomen is soft.     Tenderness: There is no abdominal tenderness.  Skin:    General: Skin is warm and dry.     Coloration: Skin is not jaundiced.  Neurological:     Mental Status: He is alert. Mental status is at baseline.     Coordination: Coordination normal.  Psychiatric:  Attention and Perception: He perceives auditory and visual hallucinations.        Thought Content: Thought content does not include suicidal ideation. Thought content does not include suicidal plan.     Comments: Agreeable, makes eye contact.     ED Results / Procedures / Treatments   Labs (all labs ordered are listed, but only abnormal results are displayed) Labs Reviewed  COMPREHENSIVE METABOLIC PANEL - Abnormal; Notable for the following components:      Result Value   Sodium 134 (*)    Calcium 8.7 (*)    All other components within normal limits  RAPID URINE DRUG SCREEN, HOSP PERFORMED - Abnormal; Notable for the following components:   Benzodiazepines POSITIVE (*)    All other components within normal limits  RESP PANEL BY RT-PCR (RSV, FLU A&B,  COVID)  [id number]  ETHANOL  CBC WITH DIFFERENTIAL/PLATELET    EKG EKG Interpretation  Date/Time:  Sunday July 24 2022 12:30:51 EST Ventricular Rate:  96 PR Interval:  136 QRS Duration: 94 QT Interval:  340 QTC Calculation: 429 R Axis:   -12 Text Interpretation: Normal sinus rhythm Possible Left atrial enlargement Borderline ECG When compared with ECG of 14-Jan-2022 12:27, PREVIOUS ECG IS PRESENT Confirmed by Octaviano Glow [phone]) on 07/24/2022 1:13:52 PM  Radiology No results found.  Procedures Procedures    Medications Ordered in ED Medications - No data to display  ED Course/ Medical Decision Making/ A&P Clinical Course as of 07/24/22 1508  Sun Jul 24, 2022  1337 EKG 12-Lead QT prolongation [HS]    Clinical Course User Index [HS] Sherrill Raring, PA-C                             Medical Decision Making Amount and/or Complexity of Data Reviewed ECG/medicine tests:  Decision-making details documented in ED Course.   This is a 41 year old male presenting to the emergency department for psychiatric evaluation.  He has reportedly been having hallucinations and agitation at home.  Denies any illicit drug use, he does have a history of substance use disorder but states is in remission.  Endorses medical compliance with his Keppra and denies any new seizures.  Will evaluate laboratory workup and check EKG.  Patient is here voluntarily and is contracted for safety, currently does not meet any criteria for IVC.  He is not responding to any internal stimuli and hallucinating on my evaluation.  I ordered, viewed and interpreted laboratory workup.  No QT prolongation, no gross electrolyte derangement, AKI or transaminitis.  Ethanol is nondetectable, BZD's are positive.  Negative for COVID and flu.  At this time patient is medically clear and appropriate for TTS evaluation.        Final Clinical Impression(s) / ED Diagnoses Final diagnoses:  None    Rx / DC Orders ED  Discharge Orders     None         Sherrill Raring, [place] 07/24/22 1508    Wyvonnia Dusky, MD 07/24/22 [phone]

## 2022-07-24 NOTE — ED Triage Notes (Addendum)
Pt here with c/o seizures and hearing voices today , [facility] , states that he takes keppra nd has not missed any doses , No SI/HI

## 2022-07-24 NOTE — ED Triage Notes (Signed)
Pt presented to [facility] notifying Patient Access that he is having mental health problems and foot pain, and feels like he is not safe, nor is anyone around him safe. Security notified and presented to [facility]. Pt cooperative; states he started taking Zyprexa couple months ago, and since then he is "having multiple personalities, I've eaten 3 cigarettes in front of my friend, and my friends are saying that I'm screaming at them, and I"m having hallucinations.". Pt states he is very concerned about his mental health and wishes to get help.  Patient is being discharged from the Urgent Care and sent to the Emergency Department via security . Per Nyoka Lint, PA, patient is in need of higher level of care due to mental health concerns. Patient is aware and verbalizes understanding of plan of care.

## 2022-07-24 NOTE — ED Notes (Signed)
Pt not visualized in lobby, does not answer calls for triage

## 2022-07-25 MED ORDER — OLANZAPINE 5 MG PO TBDP
5.0000 mg | ORAL_TABLET | Freq: Every day | ORAL | Status: DC
  Administered 2022-07-25: 5 mg via ORAL
  Filled 2022-07-25: qty 1

## 2022-07-25 MED ORDER — LEVETIRACETAM 500 MG PO TABS: 1250.0000 mg | ORAL_TABLET | Freq: Two times a day (BID) | ORAL | Status: DC

## 2022-07-25 MED ORDER — LEVETIRACETAM 500 MG PO TABS
1500.0000 mg | ORAL_TABLET | Freq: Two times a day (BID) | ORAL | Status: DC
  Administered 2022-07-25: 1500 mg via ORAL
  Filled 2022-07-25: qty 3

## 2022-07-25 MED ORDER — BICTEGRAVIR-EMTRICITAB-TENOFOV 50-200-25 MG PO TABS
1.0000 | ORAL_TABLET | Freq: Every day | ORAL | Status: DC
  Filled 2022-07-25: qty 1
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
## 2022-08-03 ENCOUNTER — Other Ambulatory Visit: Payer: Self-pay

## 2022-08-03 ENCOUNTER — Encounter: Payer: Self-pay | Admitting: Internal Medicine

## 2022-08-03 DIAGNOSIS — B20 Human immunodeficiency virus [HIV] disease: Secondary | ICD-10-CM

## 2022-08-03 DIAGNOSIS — Z79899 Other long term (current) drug therapy: Secondary | ICD-10-CM

## 2022-08-03 DIAGNOSIS — Z113 Encounter for screening for infections with a predominantly sexual mode of transmission: Secondary | ICD-10-CM

## 2022-08-04 LAB — URINE CYTOLOGY ANCILLARY ONLY
Chlamydia: NEGATIVE
Comment: NEGATIVE
Comment: NORMAL
Neisseria Gonorrhea: NEGATIVE

## 2022-08-04 LAB — T-HELPER CELLS (CD4) COUNT (NOT AT ARMC)
CD4 % Helper T Cell: 30 % — ABNORMAL LOW (ref 33–65)
CD4 T Cell Abs: 495 /uL (ref 400–1790)

## 2022-08-06 LAB — COMPLETE METABOLIC PANEL WITH GFR
AG Ratio: 1.9 (calc) (ref 1.0–2.5)
ALT: 45 U/L (ref 9–46)
AST: 40 U/L (ref 10–40)
Albumin: 5.4 g/dL — ABNORMAL HIGH (ref 3.6–5.1)
Alkaline phosphatase (APISO): 92 U/L (ref 36–130)
BUN: 15 mg/dL (ref 7–25)
CO2: 26 mmol/L (ref 20–32)
Calcium: 10.3 mg/dL (ref 8.6–10.3)
Chloride: 101 mmol/L (ref 98–110)
Creat: 1.28 mg/dL (ref 0.60–1.29)
Globulin: 2.9 g/dL (calc) (ref 1.9–3.7)
Glucose, Bld: 64 mg/dL — ABNORMAL LOW (ref 65–99)
Potassium: 3.6 mmol/L (ref 3.5–5.3)
Sodium: 140 mmol/L (ref 135–146)
Total Bilirubin: 3.6 mg/dL — ABNORMAL HIGH (ref 0.2–1.2)
Total Protein: 8.3 g/dL — ABNORMAL HIGH (ref 6.1–8.1)
eGFR: 72 mL/min/{1.73_m2} (ref >=60)

## 2022-08-06 LAB — CBC WITH DIFFERENTIAL/PLATELET
Absolute Monocytes: 842 cells/uL (ref 200–950)
Basophils Absolute: 32 cells/uL (ref 0–200)
Basophils Relative: 0.4 %
Eosinophils Absolute: 32 cells/uL (ref 15–500)
Eosinophils Relative: 0.4 %
HCT: 49.8 % (ref 38.5–50.0)
Hemoglobin: 17.7 g/dL — ABNORMAL HIGH (ref 13.2–17.1)
Lymphs Abs: 1895 cells/uL (ref 850–3900)
MCH: 33.2 pg — ABNORMAL HIGH (ref 27.0–33.0)
MCHC: 35.5 g/dL (ref 32.0–36.0)
MCV: 93.4 fL (ref 80.0–100.0)
MPV: 10.1 fL (ref 7.5–12.5)
Monocytes Relative: 10.4 %
Neutro Abs: 5297 cells/uL (ref 1500–7800)
Neutrophils Relative %: 65.4 %
Platelets: 219 10*3/uL (ref 140–400)
RBC: 5.33 10*6/uL (ref 4.20–5.80)
RDW: 11.8 % (ref 11.0–15.0)
Total Lymphocyte: 23.4 %
WBC: 8.1 10*3/uL (ref 3.8–10.8)

## 2022-08-06 LAB — RPR: RPR Ser Ql: REACTIVE — AB

## 2022-08-06 LAB — LIPID PANEL
Cholesterol: 181 mg/dL (ref <200)
HDL: 71 mg/dL (ref >=40)
LDL Cholesterol (Calc): 97 mg/dL (calc)
Non-HDL Cholesterol (Calc): 110 mg/dL (calc) (ref <130)
Total CHOL/HDL Ratio: 2.5 (calc) (ref <5.0)
Triglycerides: 52 mg/dL (ref <150)

## 2022-08-06 LAB — RPR TITER: RPR Titer: 1:1 {titer} — ABNORMAL HIGH

## 2022-08-06 LAB — HIV-1 RNA QUANT-NO REFLEX-BLD
HIV 1 RNA Quant: NOT DETECTED Copies/mL
HIV-1 RNA Quant, Log: NOT DETECTED Log cps/mL

## 2022-08-06 LAB — T PALLIDUM AB: T Pallidum Abs: POSITIVE — AB

## 2022-08-08 ENCOUNTER — Telehealth: Payer: Self-pay

## 2022-08-08 ENCOUNTER — Other Ambulatory Visit (HOSPITAL_COMMUNITY): Payer: Self-pay

## 2022-08-08 DIAGNOSIS — F323 Major depressive disorder, single episode, severe with psychotic features: Secondary | ICD-10-CM

## 2022-08-08 NOTE — Telephone Encounter (Signed)
Patient here for financial visit, requesting refills of psych medications (Zyprexa and Lexapro). Will route to provider.   Beryle Flock, RN

## 2022-08-09 MED ORDER — OLANZAPINE 5 MG PO TBDP: 5.0000 mg | ORAL_TABLET | Freq: Every day | ORAL | 2 refills | Status: DC

## 2022-08-09 MED ORDER — ESCITALOPRAM OXALATE 10 MG PO TABS: 10.0000 mg | ORAL_TABLET | Freq: Every day | ORAL | 2 refills | Status: DC

## 2022-08-09 NOTE — Addendum Note (Signed)
Addended by: Lucie Leather D on: 08/09/2022 10:39 AM   Modules accepted: Orders

## 2022-08-10 ENCOUNTER — Ambulatory Visit: Payer: Self-pay

## 2022-08-10 ENCOUNTER — Other Ambulatory Visit: Payer: Self-pay

## 2022-08-10 ENCOUNTER — Other Ambulatory Visit: Payer: Self-pay | Admitting: Pharmacist

## 2022-08-10 DIAGNOSIS — B20 Human immunodeficiency virus [HIV] disease: Secondary | ICD-10-CM

## 2022-08-10 DIAGNOSIS — F323 Major depressive disorder, single episode, severe with psychotic features: Secondary | ICD-10-CM

## 2022-08-10 MED ORDER — BICTEGRAVIR-EMTRICITAB-TENOFOV 50-200-25 MG PO TABS: 1.0000 | ORAL_TABLET | Freq: Every day | ORAL | 0 refills | Status: DC

## 2022-08-10 NOTE — Progress Notes (Signed)
Medication Samples have been provided to the patient.  Drug name: Biktarvy        Strength: 50/200/25 mg       Qty: 7 tablets (1 bottles) LOT: CPBDCA   Exp.Date: 3/26  Dosing instructions: Take one tablet by mouth once daily  The patient has been instructed regarding the correct time, dose, and frequency of taking this medication, including desired effects and most common side effects.   Bricelyn Freestone, PharmD, CPP, BCIDP, AAHIVP Clinical Pharmacist Practitioner Infectious Diseases Clinical Pharmacist [facility]  

## 2022-08-16 ENCOUNTER — Telehealth (HOSPITAL_COMMUNITY): Payer: Self-pay

## 2022-08-16 ENCOUNTER — Ambulatory Visit (HOSPITAL_COMMUNITY)
Admission: EM | Admit: 2022-08-16 | Discharge: 2022-08-17 | Disposition: A | Discharge status: Home / Self Care | Payer: No Payment, Other | Attending: Psychiatry | Admitting: Psychiatry

## 2022-08-16 ENCOUNTER — Ambulatory Visit: Payer: Self-pay

## 2022-08-16 DIAGNOSIS — Z1152 Encounter for screening for COVID-19: Secondary | ICD-10-CM | POA: Insufficient documentation

## 2022-08-16 DIAGNOSIS — F329 Major depressive disorder, single episode, unspecified: Secondary | ICD-10-CM | POA: Insufficient documentation

## 2022-08-16 DIAGNOSIS — R443 Hallucinations, unspecified: Secondary | ICD-10-CM | POA: Diagnosis not present

## 2022-08-16 DIAGNOSIS — F419 Anxiety disorder, unspecified: Secondary | ICD-10-CM | POA: Insufficient documentation

## 2022-08-16 DIAGNOSIS — Z21 Asymptomatic human immunodeficiency virus [HIV] infection status: Secondary | ICD-10-CM | POA: Insufficient documentation

## 2022-08-16 DIAGNOSIS — B20 Human immunodeficiency virus [HIV] disease: Secondary | ICD-10-CM

## 2022-08-16 DIAGNOSIS — R45851 Suicidal ideations: Secondary | ICD-10-CM | POA: Insufficient documentation

## 2022-08-16 DIAGNOSIS — R440 Auditory hallucinations: Secondary | ICD-10-CM | POA: Insufficient documentation

## 2022-08-16 LAB — URINALYSIS, ROUTINE W REFLEX MICROSCOPIC
Bilirubin Urine: NEGATIVE
Glucose, UA: NEGATIVE mg/dL
Hgb urine dipstick: NEGATIVE
Ketones, ur: NEGATIVE mg/dL
Leukocytes,Ua: NEGATIVE
Nitrite: NEGATIVE
Protein, ur: NEGATIVE mg/dL
Specific Gravity, Urine: 1.006 (ref 1.005–1.030)
pH: 6 (ref 5.0–8.0)

## 2022-08-16 LAB — POCT URINE DRUG SCREEN - MANUAL ENTRY (I-SCREEN)
POC Amphetamine UR: POSITIVE — AB
POC Buprenorphine (BUP): NOT DETECTED
POC Cocaine UR: NOT DETECTED
POC Marijuana UR: NOT DETECTED
POC Methadone UR: NOT DETECTED
POC Methamphetamine UR: POSITIVE — AB
POC Morphine: NOT DETECTED
POC Oxazepam (BZO): NOT DETECTED
POC Oxycodone UR: NOT DETECTED
POC Secobarbital (BAR): NOT DETECTED

## 2022-08-16 LAB — RESP PANEL BY RT-PCR (RSV, FLU A&B, COVID)  RVPGX2
Influenza A by PCR: NEGATIVE
Influenza B by PCR: NEGATIVE
Resp Syncytial Virus by PCR: NEGATIVE
SARS Coronavirus 2 by RT PCR: NEGATIVE

## 2022-08-16 MED ORDER — MAGNESIUM HYDROXIDE 400 MG/5ML PO SUSP: 30.0000 mL | Freq: Every day | ORAL | Status: DC | PRN

## 2022-08-16 MED ORDER — LEVETIRACETAM 500 MG PO TABS
1500.0000 mg | ORAL_TABLET | Freq: Two times a day (BID) | ORAL | Status: DC
  Administered 2022-08-16 – 2022-08-17 (×2): 1500 mg via ORAL
  Filled 2022-08-16 (×2): qty 3

## 2022-08-16 MED ORDER — OLANZAPINE 5 MG PO TBDP
5.0000 mg | ORAL_TABLET | ORAL | Status: AC
  Administered 2022-08-16: 5 mg via ORAL
  Filled 2022-08-16: qty 1

## 2022-08-16 MED ORDER — ALUM & MAG HYDROXIDE-SIMETH 200-200-20 MG/5ML PO SUSP: 30.0000 mL | ORAL | Status: DC | PRN

## 2022-08-16 MED ORDER — OLANZAPINE 5 MG PO TABS: 5.0000 mg | ORAL_TABLET | Freq: Every day | ORAL | Status: DC

## 2022-08-16 MED ORDER — BICTEGRAVIR-EMTRICITAB-TENOFOV 50-200-25 MG PO TABS
1.0000 | ORAL_TABLET | Freq: Every day | ORAL | Status: DC
  Administered 2022-08-17: 1 via ORAL
  Filled 2022-08-16: qty 1

## 2022-08-16 MED ORDER — HYDROXYZINE HCL 25 MG PO TABS
25.0000 mg | ORAL_TABLET | Freq: Three times a day (TID) | ORAL | Status: DC | PRN
  Administered 2022-08-16: 25 mg via ORAL
  Filled 2022-08-16: qty 1

## 2022-08-16 MED ORDER — TRAZODONE HCL 50 MG PO TABS
50.0000 mg | ORAL_TABLET | Freq: Every evening | ORAL | Status: DC | PRN
  Administered 2022-08-16: 50 mg via ORAL
  Filled 2022-08-16: qty 1

## 2022-08-16 MED ORDER — ACETAMINOPHEN 325 MG PO TABS: 650.0000 mg | ORAL_TABLET | Freq: Four times a day (QID) | ORAL | Status: DC | PRN

## 2022-08-16 MED ORDER — OLANZAPINE 5 MG PO TBDP
5.0000 mg | ORAL_TABLET | Freq: Every day | ORAL | Status: DC
  Administered 2022-08-16: 5 mg via ORAL
  Filled 2022-08-16: qty 1

## 2022-08-16 NOTE — Telephone Encounter (Signed)
Called pt to verify he was still on his way, as it has been over an hour since we last spoke. He states that he is on his way and should be here within 30 min or so. He needed to stop and get something to eat.
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
## 2022-08-16 NOTE — Progress Notes (Signed)
Inpatient Behavioral Health Placement  Pt meets inpatient criteria per Tharon Aquas, NP. There are no available beds at [facility].  Referral was sent to the following facilities;    Destination  Service Provider Address Phone Fax  [facility]  [place], [place] [address] [phone]  [facility]  [address] [place] [address] [place]  [facility]  [address], [place] [address] [phone]  [facility]  [place], [place] [address] [phone]  [address] [place] [address] [place]  [facility]  [address] [place] [address] [phone]  [facility]  [address] [place] [address] [phone]  [place] [place] [address] [phone]  [facility]  [address] [place] [address] [phone]  [facility]  [address], [place] [address] [phone] [facility]  [address], [place] [address] [place]  [address], [place] [address] [phone]  [facility]  [address] [place], [place] [address] [phone]  [facility]  [address], [place] [address] [place]  [facility]  [address] [place] [address] [phone] [place] [place] [address] [phone]  [facility]  [address] [place] [address] [phone]   Situation ongoing,  CSW will follow up.   Benjaman Kindler, MSW, [facility] 08/16/2022  @ 6:02 PM

## 2022-08-16 NOTE — Telephone Encounter (Signed)
Pt called and left a Voicemail stating he is hearing voices telling him to hurt himself and others. He states that he has no one and nothing to live for. He also states that he has no attachments. I called patient back and spoke with him. I asked him to clarify what was going on and he repeated that he has been hearing voices tell him to hurt himself and others. He states that he has SI and HI but wants to get help. I advised that he come to the Urgent Care to be evaluated. He agreed and had someone to bring him. He denies having any access to weapons. Pts information was taken to registration [facility]) and I will follow up to ensure pt arrived.

## 2022-08-16 NOTE — Telephone Encounter (Signed)
Verified pt arrived at 1500 to [facility]

## 2022-08-16 NOTE — ED Notes (Signed)
Pt resting at this hour. No apparent distress. RR even and unlabored. Monitored for safety.
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
## 2022-08-16 NOTE — ED Notes (Addendum)
Progress note   D: Pt seen on unit. Pt denies SI, HI and pain. Pt endorses command [facility] saying that the voices tell him to harm himself and others. He sees people who aren't there. Pt rates anxiety 11/10 and depression  11/10. Pt was given medication in assessment to calm him down. Pt states he was suicidal earlier with no plan. Contracts for safety. No other concerns noted at this time.  A: Pt provided support and encouragement.   R: Pt safe on the unit. Will continue to monitor.

## 2022-08-16 NOTE — ED Notes (Signed)
Gave patient a hot meal and some juice.
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
## 2022-08-17 ENCOUNTER — Ambulatory Visit (INDEPENDENT_AMBULATORY_CARE_PROVIDER_SITE_OTHER): Payer: Self-pay | Admitting: Internal Medicine

## 2022-08-17 ENCOUNTER — Encounter: Payer: Self-pay | Admitting: Internal Medicine

## 2022-08-17 ENCOUNTER — Other Ambulatory Visit: Payer: Self-pay

## 2022-08-17 VITALS — BP 117/75 | HR 91 | Temp 97.6°F | Ht 71.5 in | Wt 212.0 lb

## 2022-08-17 DIAGNOSIS — Z113 Encounter for screening for infections with a predominantly sexual mode of transmission: Secondary | ICD-10-CM

## 2022-08-17 DIAGNOSIS — B20 Human immunodeficiency virus [HIV] disease: Secondary | ICD-10-CM

## 2022-08-17 DIAGNOSIS — F1594 Other stimulant use, unspecified with stimulant-induced mood disorder: Secondary | ICD-10-CM

## 2022-08-17 MED ORDER — OLANZAPINE 5 MG PO TBDP: 5.0000 mg | ORAL_TABLET | Freq: Two times a day (BID) | ORAL | Status: DC

## 2022-08-17 MED ORDER — ZIPRASIDONE MESYLATE 20 MG IM SOLR: 20.0000 mg | INTRAMUSCULAR | Status: DC | PRN

## 2022-08-17 MED ORDER — OLANZAPINE 5 MG PO TBDP
5.0000 mg | ORAL_TABLET | Freq: Three times a day (TID) | ORAL | Status: DC | PRN
  Administered 2022-08-17: 5 mg via ORAL
  Filled 2022-08-17: qty 1

## 2022-08-17 MED ORDER — LORAZEPAM 1 MG PO TABS: 1.0000 mg | ORAL_TABLET | ORAL | Status: DC | PRN

## 2022-08-17 MED ORDER — LEVETIRACETAM 750 MG PO TABS: 1500.0000 mg | ORAL_TABLET | Freq: Two times a day (BID) | ORAL | 11 refills | Status: DC

## 2022-08-17 MED ORDER — BICTEGRAVIR-EMTRICITAB-TENOFOV 50-200-25 MG PO TABS: 1.0000 | ORAL_TABLET | Freq: Every day | ORAL | 11 refills | Status: DC

## 2022-08-17 MED ORDER — OLANZAPINE 5 MG PO TBDP
5.0000 mg | ORAL_TABLET | Freq: Two times a day (BID) | ORAL | Status: DC
  Filled 2022-08-17: qty 14

## 2022-08-17 MED ORDER — OLANZAPINE 5 MG PO TBDP: 5.0000 mg | ORAL_TABLET | Freq: Once | ORAL | Status: AC

## 2022-08-17 NOTE — Assessment & Plan Note (Signed)
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   Medications refilled.  Will have him follow up in 3 months

## 2022-08-17 NOTE — Progress Notes (Signed)
CSW received phone call from [place] at [facility] at 9:30 AM. Jeanett Schlein reports that [facility] is willing to review pt, however they are requesting updated labwork: CBC and CMP and physician note stating pt is medically cleared for IP treatment.  CSW then received a call from [place] at [facility] at 9:32 AM regarding pt referral. Dione Housekeeper reports she will review pt referral for IP treatment. CSW will continue to assist and follow with placement.  Denna Haggard, Latanya Presser  08/17/2022 9:40 AM

## 2022-08-17 NOTE — Discharge Instructions (Addendum)
Please follow up with outpatient psychiatry and counseling. You may come to open access hours at [facility] (this facility, SECOND floor). Open access hours for medication management are Monday through Friday 7:30AM until slots are filled. Open access hours for counseling are Monday through Thursday 7:30AM until slots are filled and Friday 12PM until slots are filled. Patients are seen on a first come, first served basis. YOU MAY NOT BE SEEN THE SAME DAY YOU ARRIVE. Please arrive early to increase the likelihood of being seen the same day, such as by 7AM.  Patient is instructed prior to discharge to: Take all medications as prescribed by his/her mental healthcare provider. Report any adverse effects and or reactions from the medicines to his/her outpatient provider promptly. Keep all scheduled appointments, to ensure that you are getting refills on time and to avoid any interruption in your medication.  If you are unable to keep an appointment call to reschedule.  Be sure to follow-up with resources and follow-up appointments provided.  Patient has been instructed & cautioned: To not engage in alcohol and or illegal drug use while on prescription medicines. In the event of worsening symptoms, patient is instructed to call the crisis hotline, 911 and or go to the nearest ED for appropriate evaluation and treatment of symptoms. To follow-up with his/her primary care provider for your other medical issues, concerns and or health care needs.  Information: -National Suicide Prevention Lifeline 1-800-SUICIDE or 204-284-6113.  -988 offers 24/7 access to trained crisis counselors who can help people experiencing mental health-related distress. People can call or text 988 or chat 988lifeline.org for themselves or if they are worried about a loved one who may need crisis support.

## 2022-08-17 NOTE — Assessment & Plan Note (Signed)
Screened negative 

## 2022-08-17 NOTE — ED Notes (Signed)
Pt asleep at this hour. No apparent distress. RR even and unlabored. Monitored for safety.

## 2022-08-17 NOTE — Progress Notes (Signed)
Pt pleasant and cooperative. Con't to report AH '' about different situations , opportunities'' but pt denies that they are command and does not appear to be responding to internal stimuli. Given am breakfast of muffin, juice and nutrigrain bar. Pt denies any SI or HI. Pt accepted am medications without issue. Pt given zyprexa 60m per NP as part of scheduled med.  Pt in no acute distress. Pt is safe.

## 2022-08-17 NOTE — Assessment & Plan Note (Signed)
I had a long discussion with him.  Very difficult since he continues to hear voices.  He is getting in to mental health care with the help of [facility].

## 2022-08-17 NOTE — ED Provider Notes (Signed)
FBC/OBS ASAP Discharge Summary  Date and Time: 08/17/2022 9:43 AM  Name: Justin [name]  MRN:  [id number]   Discharge Diagnoses:  Final diagnoses:  Hallucinations  HIV disease ([place])   Subjective:   On reassessment, pt is a&ox4, in no acute distress, non toxic appearing. He appears visibly less anxious today. When noted to pt, he confirms feeling less anxious. He reports euthymic mood today, feeling better due to medication given yesterday. He reports residual auditory hallucinations telling him about "opportunities". When asked about these opportunities, he states he has never known what the opportunities are. He denies SI/VI/HI. He denies visual hallucinations. He denies command auditory hallucinations to hurt himself or others. He is able to verbally contract to safety. He states he is treatment seeking and will call 911/EMS, go to the nearest emergency room or crisis facility for worsening psychiatric condition, including worsening SI/HI/VI, AVH. Discussed providing 7 day sample of zydis 266m bid. Discussed setting up outpatient psychiatric and counseling services. Discussed uds +meth + amph. Pt states he used meth 2 weeks ago. Discussed concerns regarding substance use as exacerbating hallucinations and seizures. Pt verbalized understanding, states he plans on not using.   Collateral with Bruce, [phone] who reports he can pick up pt today. Bruce denies safety concerns with discharge and states he will check in on pt and call 911/EMS, go to the nearest emergency room or crisis facility for worsening psychiatric condition, including worsening SI/HI/VI, AVH.   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 [facility] with CAH, VH, passive SI, HI. Pt was administered zydis 529mon 08/16/22 at 1638, 2104, and today 921. He appears to be responding well to medication. Pt is being discharged with 7 day sample of zydis 66m66mID and recommended to follow up with outpatient psychiatry and counseling. Pt also recommended to stop his substance use, which is likely exacerbating hallucinations and seizures.   Total Time spent with patient: 20 minutes  Past Psychiatric History: Amphetamine induced mood disorder, substance induced mood disorder, substance use disorder, adjustment disorder with mixed emotional features, anxiety, suicidal ideations, major depressive disorder with psychotic features  Past Medical History: Chronic hepatitis C without hepatic coma, Seizures, HIV Family History: Denies knowledge of family medical history Family Psychiatric History: Reports brother with mental health issues Social History: Living in hotel x1 month, highest level of education is HSD, support in friend Bruce Tobacco Cessation:  A prescription for an FDA-approved tobacco cessation medication was offered at discharge and the patient refused  Current Medications:  Current Facility-Administered Medications  Medication Dose Route Frequency Provider Last Rate Last Admin   acetaminophen (TYLENOL) tablet 650 mg  650 mg Oral Q6H PRN LeeTharon AquasP       alum & mag hydroxide-simeth (MAALOX/MYLANTA) 200-200-20 MG/5ML suspension 30 mL  30 mL Oral Q4H PRN LeeTharon AquasP       bictegravir-emtricitabine-tenofovir AF (BIKTARVY) 50-200-25 MG per tablet 1 tablet  1 tablet Oral Daily LeeTharon AquasP   1 tablet at 08/17/22 [id number] (ATARAX) tablet 25 mg  25 mg Oral TID PRN LeeTharon AquasP   25 mg at 08/16/22 2104   levETIRAcetam (KEPPRA) tablet 1,500 mg  1,500  mg Oral BID Tharon Aquas, NP   1,500 mg at 08/17/22 0921   OLANZapine zydis (ZYPREXA)  disintegrating tablet 5 mg  5 mg Oral Q8H PRN Tharon Aquas, NP   5 mg at 08/17/22 [id number]   And   LORazepam (ATIVAN) tablet 1 mg  1 mg Oral PRN Tharon Aquas, NP       And   ziprasidone (GEODON) injection 20 mg  20 mg Intramuscular PRN Tharon Aquas, NP       magnesium hydroxide (MILK OF MAGNESIA) suspension 30 mL  30 mL Oral Daily PRN Tharon Aquas, NP       OLANZapine zydis (ZYPREXA) disintegrating tablet 5 mg  5 mg Oral BID Tharon Aquas, NP       traZODone (DESYREL) tablet 50 mg  50 mg Oral QHS PRN Tharon Aquas, NP   50 mg at 08/16/22 2104   Current Outpatient Medications  Medication Sig Dispense Refill   bictegravir-emtricitabine-tenofovir AF (BIKTARVY) 50-200-25 MG TABS tablet Take 1 tablet by mouth daily for 7 days. 7 tablet 0   levETIRAcetam (KEPPRA) 750 MG tablet Take 1,500 mg by mouth 2 (two) times daily.     OLANZapine zydis (ZYPREXA) 5 MG disintegrating tablet Take 1 tablet (5 mg total) by mouth 2 (two) times daily.      PTA Medications:  Facility Ordered Medications  Medication   [COMPLETED] OLANZapine zydis (ZYPREXA) disintegrating tablet 5 mg   acetaminophen (TYLENOL) tablet 650 mg   alum & mag hydroxide-simeth (MAALOX/MYLANTA) 200-200-20 MG/5ML suspension 30 mL   magnesium hydroxide (MILK OF MAGNESIA) suspension 30 mL   hydrOXYzine (ATARAX) tablet 25 mg   traZODone (DESYREL) tablet 50 mg   bictegravir-emtricitabine-tenofovir AF (BIKTARVY) 50-200-25 MG per tablet 1 tablet   levETIRAcetam (KEPPRA) tablet 1,500 mg   OLANZapine zydis (ZYPREXA) disintegrating tablet 5 mg   And   LORazepam (ATIVAN) tablet 1 mg   And   ziprasidone (GEODON) injection 20 mg   OLANZapine zydis (ZYPREXA) disintegrating tablet 5 mg   [COMPLETED] OLANZapine zydis (ZYPREXA) disintegrating tablet 5 mg   PTA Medications  Medication Sig   levETIRAcetam (KEPPRA) 750 MG tablet Take 1,500 mg by mouth 2 (two) times daily.   bictegravir-emtricitabine-tenofovir AF  (BIKTARVY) 50-200-25 MG TABS tablet Take 1 tablet by mouth daily for 7 days.   OLANZapine zydis (ZYPREXA) 5 MG disintegrating tablet Take 1 tablet (5 mg total) by mouth 2 (two) times daily.       06/30/2021    4:34 PM 04/05/2021    2:50 AM 08/13/2020    2:04 PM  Depression screen PHQ 2/9  Decreased Interest 1 0 3  Down, Depressed, Hopeless 1 1 3  $ PHQ - 2 Score 2 1 6  $ Altered sleeping  0 3  Tired, decreased energy  0 3  Change in appetite  0 2  Feeling bad or failure about yourself   1 2  Trouble concentrating  1 2  Moving slowly or fidgety/restless  0 2  Suicidal thoughts  0 2  PHQ-9 Score  3 22  Difficult doing work/chores  Somewhat difficult Very difficult    Flowsheet Row ED from 08/16/2022 in [facility] Most recent reading at 08/16/2022  5:57 PM ED from 07/24/2022 in [facility] Emergency Department at [facility] Most recent reading at 07/24/2022 10:47 PM ED from 07/24/2022 in [facility] Emergency Department at [facility] Most recent reading at 07/24/2022 12:28 PM  C-SSRS  RISK CATEGORY Low Risk No Risk No Risk       Musculoskeletal  Strength & Muscle Tone: within normal limits Gait & Station: normal Patient leans: N/A  Psychiatric Specialty Exam  Presentation  General Appearance:  Appropriate for Environment; Casual; Fairly Groomed  Eye Contact: Good  Speech: Clear and Coherent; Normal Rate  Speech Volume: Normal  Handedness: Right   Mood and Affect  Mood: Euthymic  Affect: Blunt   Thought Process  Thought Processes: Coherent; Goal Directed; Linear  Descriptions of Associations:Intact  Orientation:Full (Time, Place and Person)  Thought Content:Logical  Diagnosis of Schizophrenia or Schizoaffective disorder in past: Yes  Duration of Psychotic Symptoms: Greater than six months   Hallucinations:Hallucinations: Auditory  Ideas of Reference:None  Suicidal Thoughts:Suicidal Thoughts:  No  Homicidal Thoughts:Homicidal Thoughts: No   Sensorium  Memory: Immediate Good  Judgment: Intact  Insight: Present   Executive Functions  Concentration: Good  Attention Span: Good  Recall: Good  Fund of Knowledge: Good  Language: Good   Psychomotor Activity  Psychomotor Activity: Psychomotor Activity: Normal   Assets  Assets: Communication Skills; Desire for Improvement; Financial Resources/Insurance; Resilience; Social Support   Sleep  Sleep: Sleep: Fair Number of Hours of Sleep: 4   Nutritional Assessment (For OBS and FBC admissions only) Has the patient had a weight loss or gain of 10 pounds or more in the last 3 months?: No Has the patient had a decrease in food intake/or appetite?: No Does the patient have dental problems?: No Does the patient have eating habits or behaviors that may be indicators of an eating disorder including binging or inducing vomiting?: No Has the patient recently lost weight without trying?: 0 Has the patient been eating poorly because of a decreased appetite?: 0 Malnutrition Screening Tool Score: 0    Physical Exam  Physical Exam Constitutional:      General: He is not in acute distress.    Appearance: He is not ill-appearing, toxic-appearing or diaphoretic.  Eyes:     General: No scleral icterus. Cardiovascular:     Rate and Rhythm: Normal rate.  Pulmonary:     Effort: Pulmonary effort is normal. No respiratory distress.  Neurological:     Mental Status: He is alert and oriented to person, place, and time.  Psychiatric:        Attention and Perception: Attention normal. He perceives auditory hallucinations.        Mood and Affect: Mood normal. Affect is blunt.        Speech: Speech normal.        Behavior: Behavior normal. Behavior is cooperative.        Thought Content: Thought content normal.        Cognition and Memory: Cognition and memory normal.        Judgment: Judgment normal.    Review of  Systems  Constitutional:  Negative for chills and fever.  Respiratory:  Negative for shortness of breath.   Cardiovascular:  Negative for chest pain and palpitations.  Gastrointestinal:  Negative for abdominal pain.  Neurological:  Negative for headaches.  Psychiatric/Behavioral:  Positive for hallucinations and substance abuse.    Blood pressure 123/82, pulse 94, temperature 98 F (36.7 C), temperature source Oral, resp. rate 18, SpO2 99 %. There is no height or weight on file to calculate BMI.  Demographic Factors:  Male, Low socioeconomic status, Living alone, and Unemployed  Loss Factors: Financial problems/change in socioeconomic status  Historical Factors: Prior suicide attempts and Family history of  mental illness or substance abuse  Risk Reduction Factors:   Positive social support  Continued Clinical Symptoms:  Alcohol/Substance Abuse/Dependencies Previous Psychiatric Diagnoses and Treatments  Cognitive Features That Contribute To Risk:  None    Suicide Risk:  Minimal: No identifiable suicidal ideation.  Patients presenting with no risk factors but with morbid ruminations; may be classified as minimal risk based on the severity of the depressive symptoms  Plan Of Care/Follow-up recommendations:  Follow up with outpatient psychiatry and counseling  Disposition:  Discharge   Tharon Aquas, NP 08/17/2022, 9:43 AM

## 2022-08-17 NOTE — ED Notes (Signed)
Assumed care of pt at 0730. Pt resting quietly, appears in no acute distress. Respirations unlabored. Pt per staff report pt has been irritated by noise on the unit but able to be verbally redirected. Pt is safe. Will con't to monitor.

## 2022-08-17 NOTE — ED Notes (Signed)
Patient has been provided with lunch

## 2022-08-17 NOTE — Progress Notes (Signed)
   Subjective:    Patient ID: Justin [name], male    DOB: December 08, 1980, 42 y.o.   MRN: [id number]  HPI Justin [name] is here for routine follow up He continues on [place] and denies any missed doses.  He also continues on Keppra for seizures.  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  He is not currently in mental health but applying for medicaid and working with [facility].  No new complaints today.    Review of Systems  Constitutional:  Negative for fatigue.  Gastrointestinal:  Negative for diarrhea.  Skin:  Negative for rash.       Objective:   Physical Exam Eyes:     General: No scleral icterus. Pulmonary:     Effort: Pulmonary effort is normal.  Skin:    Findings: No rash.  Neurological:     Mental Status: He is alert.  Psychiatric:        Mood and Affect: Mood normal.   SH" + drug use        Assessment & Plan:

## 2022-08-19 NOTE — Progress Notes (Signed)
HIV positive.  Clinically unable to determine

## 2022-08-31 ENCOUNTER — Telehealth: Payer: Self-pay

## 2022-08-31 NOTE — Telephone Encounter (Signed)
Called [facility], they received prescription for Keppra 750 mg, but the patient has not picked it up yet. Attempted to call [facility], no answer. Left HIPAA compliant voicemail requesting callback.   Beryle Flock, RN

## 2022-08-31 NOTE — Telephone Encounter (Signed)
Spoke with Terrence Dupont with [facility], [facility] reported to her that the pharmacy called him and said they only had '200mg'$  tablets and '500mg'$  tablets. Asked her to have him please call us if they are unable to fill the '750mg'$  prescription.  Called [facility] again, they confirm they have the '750mg'$  dose in stock.   Beryle Flock, RN

## 2022-08-31 NOTE — Telephone Encounter (Signed)
Patient Justin [name] came in saying his Dosage for medication Keppra was wrong, needs to be '750MG'$ . I did inform his that the dosage in his chart was correct. Want to know if we could resend or call [facility] to let them know because the were giving him a '200mg'$  instead of '750mg'$ . Best contact number is [phone]

## 2022-09-11 ENCOUNTER — Emergency Department (HOSPITAL_COMMUNITY): Payer: Self-pay

## 2022-09-11 ENCOUNTER — Other Ambulatory Visit: Payer: Self-pay

## 2022-09-11 ENCOUNTER — Encounter (HOSPITAL_COMMUNITY): Payer: Self-pay

## 2022-09-11 ENCOUNTER — Emergency Department (HOSPITAL_COMMUNITY)
Admission: EM | Admit: 2022-09-11 | Discharge: 2022-09-11 | Disposition: A | Discharge status: Home / Self Care | Payer: Self-pay | Attending: Emergency Medicine | Admitting: Emergency Medicine

## 2022-09-11 DIAGNOSIS — R569 Unspecified convulsions: Secondary | ICD-10-CM | POA: Insufficient documentation

## 2022-09-11 DIAGNOSIS — Z21 Asymptomatic human immunodeficiency virus [HIV] infection status: Secondary | ICD-10-CM | POA: Insufficient documentation

## 2022-09-11 LAB — CBC WITH DIFFERENTIAL/PLATELET
Abs Immature Granulocytes: 0.01 10*3/uL (ref 0.00–0.07)
Basophils Absolute: 0 10*3/uL (ref 0.0–0.1)
Basophils Relative: 1 %
Eosinophils Absolute: 0 10*3/uL (ref 0.0–0.5)
Eosinophils Relative: 1 %
HCT: 53.1 % — ABNORMAL HIGH (ref 39.0–52.0)
Hemoglobin: 17.7 g/dL — ABNORMAL HIGH (ref 13.0–17.0)
Immature Granulocytes: 0 %
Lymphocytes Relative: 39 %
Lymphs Abs: 1.6 10*3/uL (ref 0.7–4.0)
MCH: 33 pg (ref 26.0–34.0)
MCHC: 33.3 g/dL (ref 30.0–36.0)
MCV: 98.9 fL (ref 80.0–100.0)
Monocytes Absolute: 0.3 10*3/uL (ref 0.1–1.0)
Monocytes Relative: 8 %
Neutro Abs: 2.2 10*3/uL (ref 1.7–7.7)
Neutrophils Relative %: 51 %
Platelets: 189 10*3/uL (ref 150–400)
RBC: 5.37 MIL/uL (ref 4.22–5.81)
RDW: 12.3 % (ref 11.5–15.5)
WBC: 4.2 10*3/uL (ref 4.0–10.5)
nRBC: 0 % (ref 0.0–0.2)

## 2022-09-11 LAB — COMPREHENSIVE METABOLIC PANEL
ALT: 36 U/L (ref 0–44)
AST: 31 U/L (ref 15–41)
Albumin: 4.8 g/dL (ref 3.5–5.0)
Alkaline Phosphatase: 81 U/L (ref 38–126)
Anion gap: 8 (ref 5–15)
BUN: 7 mg/dL (ref 6–20)
CO2: 26 mmol/L (ref 22–32)
Calcium: 9.1 mg/dL (ref 8.9–10.3)
Chloride: 104 mmol/L (ref 98–111)
Creatinine, Ser: 1.18 mg/dL (ref 0.61–1.24)
GFR, Estimated: >60 mL/min (ref >60)
Glucose, Bld: 78 mg/dL (ref 70–99)
Potassium: 3.9 mmol/L (ref 3.5–5.1)
Sodium: 138 mmol/L (ref 135–145)
Total Bilirubin: 1.6 mg/dL — ABNORMAL HIGH (ref 0.3–1.2)
Total Protein: 8.2 g/dL — ABNORMAL HIGH (ref 6.5–8.1)

## 2022-09-11 LAB — LACTIC ACID, PLASMA: Lactic Acid, Venous: 1.4 mmol/L (ref 0.5–1.9)

## 2022-09-11 LAB — CBG MONITORING, ED: Glucose-Capillary: 77 mg/dL (ref 70–99)

## 2022-09-11 LAB — RAPID URINE DRUG SCREEN, HOSP PERFORMED
Amphetamines: NOT DETECTED
Barbiturates: NOT DETECTED
Benzodiazepines: NOT DETECTED
Cocaine: NOT DETECTED
Opiates: NOT DETECTED
Tetrahydrocannabinol: NOT DETECTED

## 2022-09-11 NOTE — ED Provider Notes (Signed)
Patient seen by Dr. Linda Hedges.  Please see her note.  Plan was to follow-up on the CT scan.  CT is unremarkable.  No recurrent seizures.  Patient is feeling well.  He is ready for discharge.   Dorie Rank, MD 09/11/22 [phone]

## 2022-09-11 NOTE — ED Notes (Signed)
UNABLE TO GET EKG DUE TO IV TEAM TRYING TO START A IV

## 2022-09-11 NOTE — ED Triage Notes (Signed)
Pt reports seizure this morning states has been taking his medication.

## 2022-09-11 NOTE — ED Provider Notes (Signed)
[place] EMERGENCY DEPARTMENT AT [facility] Provider Note   CSN: [id number] Arrival date & time: 09/11/22  1145     History  Chief Complaint  Patient presents with   Seizures    Justin [name] is a 42 y.o. male with seizure disorder, substance use, chronic hep C, HIV, adjustment disorder, anxiety, who presents with seizure.   Patient states that he went to bed last night feeling normal and then woke up this morning outside wandering around.  He states that this is happened to him before when he has had seizures where he will have a seizure and then during his postictal period wander around and to come to not knowing where he is.  He states that when he "came to " this morning he had a headache but is not sure if he hit his head or not.  He takes his keppra daily and hasn't missed any doses. He denies feeling any illnesses recently, denies f/c, CP, SOB, cough, n/V/D/C, urinary symptoms. Denies pain anywhere else and states other than the headache he feels normal. No Numbness/tingling, asymmetric weakness.  Denies any drug or alcohol use yesterday.  States he does not feel confused now.   Seizures      Home Medications Prior to Admission medications   Medication Sig Start Date End Date Taking? Authorizing Provider  bictegravir-emtricitabine-tenofovir AF (BIKTARVY) 50-200-25 MG TABS tablet Take 1 tablet by mouth daily. 08/17/22   Thayer Headings, MD  levETIRAcetam (KEPPRA) 750 MG tablet Take 2 tablets (1,500 mg total) by mouth 2 (two) times daily. 08/17/22   Comer, Okey Regal, MD  OLANZapine zydis (ZYPREXA) 5 MG disintegrating tablet Take 1 tablet (5 mg total) by mouth 2 (two) times daily. 08/17/22   Tharon Aquas, NP      Allergies    Patient has no known allergies.    Review of Systems   Review of Systems  Neurological:  Positive for seizures.   Review of systems Negative for f/c.  A 10 point review of systems was performed and is negative unless otherwise  reported in HPI.  Physical Exam Updated Vital Signs BP (!) 137/102   Pulse 65   Temp 98.6 F (37 C)   Resp 18   Ht 5\' 11"  (1.803 m)   Wt 96 kg   SpO2 100%   BMI 29.52 kg/m  Physical Exam General: Normal appearing male, lying in bed.  HEENT: PERRLA, EOMI, no nystagmus, sclera anicteric, MMM, trachea midline.  NCAT.  Stable forehead midface nasal bridge.  Dentition intact.  No tongue fasciculations. Cardiology: RRR, no murmurs/rubs/gallops. BL radial and DP pulses equal bilaterally.  Resp: Normal respiratory rate and effort. CTAB, no wheezes, rhonchi, crackles.  Abd: Soft, non-tender, non-distended. No rebound tenderness or guarding.  GU: Deferred. MSK: No peripheral edema or signs of trauma. Extremities without deformity or TTP. No cyanosis or clubbing. Skin: warm, dry. No rashes or lesions. Back: No midline CT or L-spine tenderness palpation deformities or step-offs.  No CVA tenderness Neuro: A&Ox4, CNs II-XII grossly intact. MAEs. Sensation grossly intact.  Normal speech, normal gait, no tremors. Psych: Normal mood and affect.   ED Results / Procedures / Treatments   Labs (all labs ordered are listed, but only abnormal results are displayed) Labs Reviewed  CBC WITH DIFFERENTIAL/PLATELET - Abnormal; Notable for the following components:      Result Value   Hemoglobin 17.7 (*)    HCT 53.1 (*)    All other components within  normal limits  COMPREHENSIVE METABOLIC PANEL - Abnormal; Notable for the following components:   Total Protein 8.2 (*)    Total Bilirubin 1.6 (*)    All other components within normal limits  LACTIC ACID, PLASMA  RAPID URINE DRUG SCREEN, HOSP PERFORMED  CBG MONITORING, ED  CBG MONITORING, ED    EKG EKG Interpretation  Date/Time:  Sunday September 11 2022 13:25:34 EDT Ventricular Rate:  64 PR Interval:  153 QRS Duration: 104 QT Interval:  424 QTC Calculation: 438 R Axis:   -1 Text Interpretation: Sinus rhythm ST elev, probable normal early repol  pattern Baseline wander in lead(s) V2 Confirmed by Cindee Lame [phone]) on 09/11/2022 2:17:19 PM  Radiology [facility] Pending  Procedures Procedures    Medications Ordered in ED Medications - No data to display  ED Course/ Medical Decision Making/ A&P                          Medical Decision Making Amount and/or Complexity of Data Reviewed Labs: ordered. Decision-making details documented in ED Course. Radiology: ordered.    This patient presents to the ED for concern of likely seizure episode, with confusion following; this involves an extensive number of treatment options, and is a complaint that carries with it a high risk of complications and morbidity.  However patient is currently hemodynamic stable, very well-appearing, has normal mental status baseline.  MDM:    Causes of seizures include: Patient is known seizure disorder, medication noncompliance the patient reports has been compliant with his Keppra, no recent doses changes to his Keppra.  No fevers or chills, neck stiffness to suggest meningitis/encephalitis.  No vital sign abnormalities tremors or tongue fasciculations to suggest alcohol withdrawal.  Patient does have history of polysubstance use though denies any substance use yesterday, consider toxic ingestion.  Also consider electrolyte abnormalities (hyponatremia/hypernatremia, hypomagnesemia, hypocalcemia), hypoglycemia, uremia, non-epileptic seizure. Patient seem to have an episode today of lost time, amnestic episode.  Consider drug or alcohol use, however patient does have history of similar episodes during postictal periods after seizures, and he states this is the same. He denies drug/alcohol use last night, seems not intoxicated and at his baseline on my evaluation.  He has no focal neurodeficits to indicate [place] it is completely unknown if the patient experienced trauma and he does complain of a headache at this time.  Will obtain CT head.  I did asked with patient  following up with neurology and the possibility of changing his seizure medications and he states that he has been on the [place] for a very long time and would like to talk to neurology in the clinic.  I discussed with patient that if he has further seizures she will need to return to the emergency department to potentially have dose change or be reevaluated, patient reports understanding.  He states he will call his neurologist in the morning to make an appointment.    Clinical Course as of 09/20/22 1638  Sun Sep 11, 2022  1416 Glucose-Capillary: 77 [HN]  1416 Lactic Acid, Venous: 1.4 [HN]  1416 Comprehensive metabolic panel(!) Unremarkable, typically has elevated T bili [HN]  1416 Hemoglobin(!): 17.7 Consider volume contraction [HN]    Clinical Course User Index [HN] Audley Hose, MD    Labs: I Ordered, and personally interpreted labs.  The pertinent results include:  those listed above  Imaging Studies ordered: I ordered imaging studies including CTH  Additional history obtained from chart review.  Reevaluation: After the interventions noted above, I reevaluated the patient and found that they have :resolved  Social Determinants of Health: Patient lives independently   Disposition: Lab workup including CMP, CBC, UDS very reassuring.  Patient likely mildly dehydrated.  He also has a mild elevation in his bilirubin that has been present in the past.  Patient is signed out to the oncoming ED physician Dr. Tomi Bamberger who is made aware of her history, presentation, exam, workup, and plan.  Plan is to obtain [facility] and if negative can be DC'd with neurology f/u.   Co morbidities that complicate the patient evaluation  Past Medical History:  Diagnosis Date   Chronic hepatitis C without hepatic coma ([place]) 08/17/2017   Endocarditis    History of syphilis 08/25/2014   Treated by [facility] 07-2014.   HIV (human immunodeficiency virus infection) ([place])    Seizures ([place])      Medicines No  orders of the defined types were placed in this encounter.   I have reviewed the patients home medicines and have made adjustments as needed  Problem List / ED Course: Problem List Items Addressed This Visit       Other   Seizure [facility]) - Primary                This note was created using dictation software, which may contain spelling or grammatical errors.    Audley Hose, MD 09/20/22 [phone]

## 2022-09-12 ENCOUNTER — Emergency Department (HOSPITAL_COMMUNITY)
Admission: EM | Admit: 2022-09-12 | Discharge: 2022-09-12 | Disposition: A | Discharge status: Home / Self Care | Payer: Self-pay | Attending: Emergency Medicine | Admitting: Emergency Medicine

## 2022-09-12 ENCOUNTER — Encounter (HOSPITAL_COMMUNITY): Payer: Self-pay

## 2022-09-12 ENCOUNTER — Other Ambulatory Visit: Payer: Self-pay | Admitting: Internal Medicine

## 2022-09-12 ENCOUNTER — Telehealth: Payer: Self-pay

## 2022-09-12 DIAGNOSIS — G40909 Epilepsy, unspecified, not intractable, without status epilepticus: Secondary | ICD-10-CM | POA: Insufficient documentation

## 2022-09-12 DIAGNOSIS — E871 Hypo-osmolality and hyponatremia: Secondary | ICD-10-CM | POA: Insufficient documentation

## 2022-09-12 DIAGNOSIS — E860 Dehydration: Secondary | ICD-10-CM | POA: Insufficient documentation

## 2022-09-12 DIAGNOSIS — R569 Unspecified convulsions: Secondary | ICD-10-CM

## 2022-09-12 LAB — COMPREHENSIVE METABOLIC PANEL
ALT: 31 U/L (ref 0–44)
AST: 28 U/L (ref 15–41)
Albumin: 4.7 g/dL (ref 3.5–5.0)
Alkaline Phosphatase: 81 U/L (ref 38–126)
Anion gap: 8 (ref 5–15)
BUN: 7 mg/dL (ref 6–20)
CO2: 27 mmol/L (ref 22–32)
Calcium: 8.8 mg/dL — ABNORMAL LOW (ref 8.9–10.3)
Chloride: 99 mmol/L (ref 98–111)
Creatinine, Ser: 1.17 mg/dL (ref 0.61–1.24)
GFR, Estimated: >60 mL/min (ref >60)
Glucose, Bld: 112 mg/dL — ABNORMAL HIGH (ref 70–99)
Potassium: 3.3 mmol/L — ABNORMAL LOW (ref 3.5–5.1)
Sodium: 134 mmol/L — ABNORMAL LOW (ref 135–145)
Total Bilirubin: 2.1 mg/dL — ABNORMAL HIGH (ref 0.3–1.2)
Total Protein: 8.1 g/dL (ref 6.5–8.1)

## 2022-09-12 LAB — CBC WITH DIFFERENTIAL/PLATELET
Abs Immature Granulocytes: 0.03 10*3/uL (ref 0.00–0.07)
Basophils Absolute: 0 10*3/uL (ref 0.0–0.1)
Basophils Relative: 0 %
Eosinophils Absolute: 0 10*3/uL (ref 0.0–0.5)
Eosinophils Relative: 0 %
HCT: 51.1 % (ref 39.0–52.0)
Hemoglobin: 17.3 g/dL — ABNORMAL HIGH (ref 13.0–17.0)
Immature Granulocytes: 0 %
Lymphocytes Relative: 19 %
Lymphs Abs: 1.5 10*3/uL (ref 0.7–4.0)
MCH: 33 pg (ref 26.0–34.0)
MCHC: 33.9 g/dL (ref 30.0–36.0)
MCV: 97.5 fL (ref 80.0–100.0)
Monocytes Absolute: 0.6 10*3/uL (ref 0.1–1.0)
Monocytes Relative: 7 %
Neutro Abs: 5.8 10*3/uL (ref 1.7–7.7)
Neutrophils Relative %: 74 %
Platelets: 199 10*3/uL (ref 150–400)
RBC: 5.24 MIL/uL (ref 4.22–5.81)
RDW: 11.9 % (ref 11.5–15.5)
WBC: 7.9 10*3/uL (ref 4.0–10.5)
nRBC: 0 % (ref 0.0–0.2)

## 2022-09-12 LAB — ETHANOL: Alcohol, Ethyl (B): <10 mg/dL (ref <10)

## 2022-09-12 LAB — CBG MONITORING, ED
Glucose-Capillary: 105 mg/dL — ABNORMAL HIGH (ref 70–99)
Glucose-Capillary: 95 mg/dL (ref 70–99)

## 2022-09-12 MED ORDER — LORAZEPAM 2 MG/ML IJ SOLN
INTRAMUSCULAR | Status: AC
  Filled 2022-09-12: qty 1

## 2022-09-12 MED ORDER — SODIUM CHLORIDE 0.9 % IV BOLUS
500.0000 mL | Freq: Once | INTRAVENOUS | Status: AC
  Administered 2022-09-12: 500 mL via INTRAVENOUS

## 2022-09-12 MED ORDER — LEVETIRACETAM 500 MG PO TABS
1500.0000 mg | ORAL_TABLET | Freq: Once | ORAL | Status: AC
  Administered 2022-09-12: 1500 mg via ORAL
  Filled 2022-09-12: qty 3

## 2022-09-12 NOTE — ED Provider Triage Note (Signed)
Emergency Medicine Provider Triage Evaluation Note  Justin [name] , a 42 y.o. male  was evaluated in triage.  Pt complains of seizure.  Has had seizures intermittently for the past 7 years.  They have gotten more frequent recently.  Reports compliance with his Keppra.  States she was working today and had a seizure without prodrome.  Believes he may be dehydrated.  Does not know if he hit his head.  Feels more anxious than normal but is otherwise at his baseline.  Denies drug and alcohol use  Review of Systems  Positive: As above Negative: As above  Physical Exam  BP (!) 150/94 (BP Location: Left Arm)   Pulse (!) 104   Temp 98.1 F (36.7 C) (Oral)   Resp 16   Ht 6' (1.829 m)   Wt 102.1 kg   SpO2 100%   BMI 30.52 kg/m  Gen:   Awake, no distress   Resp:  Normal effort  MSK:   Moves extremities without difficulty  Other:    Medical Decision Making  Medically screening exam initiated at 3:08 PM.  Appropriate orders placed.  Sheran Luz Balducci was informed that the remainder of the evaluation will be completed by another provider, this initial triage assessment does not replace that evaluation, and the importance of remaining in the ED until their evaluation is complete.     Roylene Reason, [place] 09/12/22 1509

## 2022-09-12 NOTE — ED Notes (Signed)
Pt polite, cooperative, appreciative. Does not remember sz activity. Denies current complaints or sx. Denies pain, or HA.

## 2022-09-12 NOTE — ED Notes (Signed)
Transferred from w/c to stretcher, stood and verbalized wanting to leave, resisting care. Reluctant to cooperate. Speaking with EDPA at [facility] at this time. Slow to respond and cooperate. BS 95 at this time.

## 2022-09-12 NOTE — ED Provider Notes (Signed)
[place] EMERGENCY DEPARTMENT AT [facility] Provider Note   CSN: [id number] Arrival date & time: 09/12/22  1423     History Chief Complaint  Patient presents with   Seizures    Justin [name] is a 42 y.o. male.  Patient with past history significant for epilepsy presents to the ER with complaints of seizures.  He reports that prior to arriving to the emergency department he had a seizure in his vehicle cannot recall any events immediately surrounding this.  Was seen yesterday in the emergency department for similar complaints.  He reports he manages his seizures with Keppra 1500 mg twice a day.  Has not been able to follow-up with neurology in the last 1 to 2 months for evaluation of increased frequency of seizures.  Has expressed some concern that his Zyprexa may be contributing to increased frequency of seizures.   Seizures      Home Medications Prior to Admission medications   Medication Sig Start Date End Date Taking? Authorizing Provider  bictegravir-emtricitabine-tenofovir AF (BIKTARVY) 50-200-25 MG TABS tablet Take 1 tablet by mouth daily. 08/17/22   Thayer Headings, MD  levETIRAcetam (KEPPRA) 750 MG tablet Take 2 tablets (1,500 mg total) by mouth 2 (two) times daily. 08/17/22   Comer, Okey Regal, MD  OLANZapine zydis (ZYPREXA) 5 MG disintegrating tablet Take 1 tablet (5 mg total) by mouth 2 (two) times daily. 08/17/22   Tharon Aquas, NP      Allergies    Patient has no known allergies.    Review of Systems   Review of Systems  Neurological:  Positive for seizures.    Physical Exam Updated Vital Signs BP 127/64   Pulse 82   Temp 99.1 F (37.3 C) (Oral)   Resp 16   Ht 6' (1.829 m)   Wt 102.1 kg   SpO2 97%   BMI 30.52 kg/m  Physical Exam  ED Results / Procedures / Treatments   Labs (all labs ordered are listed, but only abnormal results are displayed) Labs Reviewed  COMPREHENSIVE METABOLIC PANEL - Abnormal; Notable for the following  components:      Result Value   Sodium 134 (*)    Potassium 3.3 (*)    Glucose, Bld 112 (*)    Calcium 8.8 (*)    Total Bilirubin 2.1 (*)    All other components within normal limits  CBC WITH DIFFERENTIAL/PLATELET - Abnormal; Notable for the following components:   Hemoglobin 17.3 (*)    All other components within normal limits  CBG MONITORING, ED - Abnormal; Notable for the following components:   Glucose-Capillary 105 (*)    All other components within normal limits  ETHANOL  LEVETIRACETAM LEVEL  CBG MONITORING, ED  CBG MONITORING, ED    EKG None  Radiology CT Head Wo Contrast  Result Date: 09/11/2022 CLINICAL DATA:  Trauma, seizure EXAM: CT HEAD WITHOUT CONTRAST TECHNIQUE: Contiguous axial images were obtained from the base of the skull through the vertex without intravenous contrast. RADIATION DOSE REDUCTION: This exam was performed according to the departmental dose-optimization program which includes automated exposure control, adjustment of the mA and/or kV according to patient size and/or use of iterative reconstruction technique. COMPARISON:  Head CT 01/13/2022.  MRI head 07/31/2021. FINDINGS: Brain: No evidence of acute infarction, hemorrhage, hydrocephalus, extra-axial collection or mass lesion/mass effect. Vascular: No hyperdense vessel or unexpected calcification. Skull: Normal. Negative for fracture or focal lesion. Sinuses/Orbits: No acute finding. Other: There is scalp soft tissue  swelling overlying the vertex. IMPRESSION: No acute intracranial abnormality. Electronically Signed   By: Ronney Asters M.D.   On: 09/11/2022 15:43    Procedures Procedures   Medications Ordered in ED Medications  levETIRAcetam (KEPPRA) tablet 1,500 mg (has no administration in time range)  sodium chloride 0.9 % bolus 500 mL (500 mLs Intravenous New Bag/Given 09/12/22 2004)    ED Course/ Medical Decision Making/ A&P                           Medical Decision Making Amount and/or  Complexity of Data Reviewed Labs: ordered.  Risk Prescription drug management.   This patient presents to the ED for concern of seizures.  Differential diagnosis includes hypoglycemic seizure, hyponatremic seizure, status epilepticus   Lab Tests:  I Ordered, and personally interpreted labs.  The pertinent results include: Normal CBC, CMP with some evidence of mild dehydration with hyponatremia at 134, normal CBC   Imaging Studies ordered:  CT head performed one day earlier on 09/11/2022 showing no acute intracranial abnormalities   Medicines ordered and prescription drug management:  I ordered medication including fluids for dehydration Reevaluation of the patient after these medicines showed that the patient improved I have reviewed the patients home medicines and have made adjustments as needed   Problem List / ED Course:  Patient with a history significant for epilepsy presents to the ED with concerns for seizures. Reports seizure prior to arriving to the ED while he was in a parked vehicle. Has poor follow up with neurology as he just recently moved back to [facility] and his last neurologist was in [place], [place]. Most recently has not had a change to Keppra dose for several months and still takes '1500mg'$  BID. Also denies any recently alcohol use but  appears to have started Zyprexa about 1.5 months ago prior to onset of increasing seizure activity. Unsure if this could be contributing to his symptoms. Patient reportedly had a brief seizure in the waiting room and was brought back. Patient was confused when brought back but was not actively seizing. During period of observation, patient remained stable without recurrence of seizure activity. Does not believe he has missed any of his doses of Keppra but is concerned that Zyprexa may be worsening his seizure activity.  Patient would ultimately benefit from neurology follow up for potential adjustments to medications or alteration if any  particular prescriptions could be lowering seizure threshold. Informed patient that he needs evaluation with neurology but he reports that due to insurance, he has not been able to see anyone. He is currently applying of Medicaid. Will have patient return to ED if seizure reoccurs and is unable to establish outpatient follow up with neurology. Patient agreeable with treatment plan and verbalized understanding return precautions. All questions answered prior to patient discharge.   Social Determinants of Health:  Uninsured No follow up with neurology in place  Final Clinical Impression(s) / ED Diagnoses Final diagnoses:  None    Rx / DC Orders ED Discharge Orders     None         Luvenia Heller, PA-C 09/14/22 [place], [place], DO 09/14/22 [phone]

## 2022-09-12 NOTE — ED Notes (Signed)
Lab contacted. Verbalize can run ethanol level.

## 2022-09-12 NOTE — ED Notes (Signed)
Labeled blue and dark green tube sent went lab orders. [employer]

## 2022-09-12 NOTE — ED Triage Notes (Addendum)
Patient said he had a seizure in the car 1 hour ago. Unknown how long it lasted. Denies drug or alcohol use. Last alcoholic drink 1 week ago. Took seizure medication this morning. Has a headache.

## 2022-09-12 NOTE — Discharge Instructions (Addendum)
Per [place] DMV statutes, patients with seizures are not allowed to drive until they have been seizure-free for six months.  Other recommendations include using caution when using heavy equipment or power tools. Avoid working on ladders or at heights. Take showers instead of baths.  Do not swim alone.  Ensure the water temperature is not too high on the home water heater. Do not go swimming alone. Do not lock yourself in a room alone (i.e. bathroom). When caring for infants or small children, sit down when holding, feeding, or changing them to minimize risk of injury to the child in the event you have a seizure. Maintain good sleep hygiene. Avoid alcohol.  Also recommend adequate sleep, hydration, good diet and minimize stress.     During the Seizure   - First, ensure adequate ventilation and place patients on the floor on their left side  Loosen clothing around the neck and ensure the airway is patent. If the patient is clenching the teeth, do not force the mouth open with any object as this can cause severe damage - Remove all items from the surrounding that can be hazardous. The patient may be oblivious to what's happening and may not even know what he or she is doing. If the patient is confused and wandering, either gently guide him/her away and block access to outside areas - Reassure the individual and be comforting - Call 911. In most cases, the seizure ends before EMS arrives. However, there are cases when seizures may last over 3 to 5 minutes. Or the individual may have developed breathing difficulties or severe injuries. If a pregnant patient or a person with diabetes develops a seizure, it is prudent to call an ambulance. - Finally, if the patient does not regain full consciousness, then call EMS. Most patients will remain confused for about 45 to 90 minutes after a seizure, so you must use judgment in calling for help. - Avoid restraints but make sure the patient is in a bed with  padded side rails - Place the individual in a lateral position with the neck slightly flexed; this will help the saliva drain from the mouth and prevent the tongue from falling backward - Remove all nearby furniture and other hazards from the area - Provide verbal assurance as the individual is regaining consciousness - Provide the patient with privacy if possible - Call for help and start treatment as ordered by the caregiver   After the Seizure (Postictal Stage)   After a seizure, most patients experience confusion, fatigue, muscle pain and/or a headache. Thus, one should permit the individual to sleep. For the next few days, reassurance is essential. Being calm and helping reorient the person is also of importance.   Most seizures are painless and end spontaneously. Seizures are not harmful to others but can lead to complications such as stress on the lungs, brain and the heart. Individuals with prior lung problems may develop labored breathing and respiratory distress.    

## 2022-09-12 NOTE — ED Notes (Signed)
EDPA at [facility]. Pt alert, NAD, interactive, resps e/u, speaking in clear complete sentences. Less confusion. No apparent confusion at this time.

## 2022-09-12 NOTE — Telephone Encounter (Signed)
Patient called with boss on line stating he has had multiple seizures this week and needs his refills. Seen in ED yesterday CT WNL.  Boss told him he has his Keppra in his hand now. His boss states he is having severe confusion and memory loss and feels he needs to go back to the ED. Advised if they are concerned with his mental status they should have him evaluated. Manager wanted to inform the prescriber that he will be going back to ED since this happened on the job especially. Denies injuries as they caught him and placed him on floor during this event. This took place about hour before call. Advised I will alert provider and that they should head to ED.

## 2022-09-14 LAB — LEVETIRACETAM LEVEL: Levetiracetam Lvl: 34.9 ug/mL (ref 10.0–40.0)

## 2022-09-24 ENCOUNTER — Emergency Department (HOSPITAL_COMMUNITY): Payer: Self-pay

## 2022-09-24 ENCOUNTER — Other Ambulatory Visit: Payer: Self-pay

## 2022-09-24 ENCOUNTER — Emergency Department (HOSPITAL_COMMUNITY)
Admission: EM | Admit: 2022-09-24 | Discharge: 2022-09-24 | Disposition: A | Discharge status: Home / Self Care | Payer: Self-pay | Attending: Emergency Medicine | Admitting: Emergency Medicine

## 2022-09-24 DIAGNOSIS — R17 Unspecified jaundice: Secondary | ICD-10-CM | POA: Insufficient documentation

## 2022-09-24 DIAGNOSIS — R000 Tachycardia, unspecified: Secondary | ICD-10-CM | POA: Insufficient documentation

## 2022-09-24 DIAGNOSIS — F1510 Other stimulant abuse, uncomplicated: Secondary | ICD-10-CM | POA: Insufficient documentation

## 2022-09-24 DIAGNOSIS — T50904A Poisoning by unspecified drugs, medicaments and biological substances, undetermined, initial encounter: Secondary | ICD-10-CM

## 2022-09-24 DIAGNOSIS — R4689 Other symptoms and signs involving appearance and behavior: Secondary | ICD-10-CM

## 2022-09-24 LAB — CBC WITH DIFFERENTIAL/PLATELET
Abs Immature Granulocytes: 0.02 10*3/uL (ref 0.00–0.07)
Basophils Absolute: 0 10*3/uL (ref 0.0–0.1)
Basophils Relative: 0 %
Eosinophils Absolute: 0 10*3/uL (ref 0.0–0.5)
Eosinophils Relative: 0 %
HCT: 53.6 % — ABNORMAL HIGH (ref 39.0–52.0)
Hemoglobin: 18 g/dL — ABNORMAL HIGH (ref 13.0–17.0)
Immature Granulocytes: 0 %
Lymphocytes Relative: 16 %
Lymphs Abs: 1 10*3/uL (ref 0.7–4.0)
MCH: 32.5 pg (ref 26.0–34.0)
MCHC: 33.6 g/dL (ref 30.0–36.0)
MCV: 96.8 fL (ref 80.0–100.0)
Monocytes Absolute: 0.5 10*3/uL (ref 0.1–1.0)
Monocytes Relative: 7 %
Neutro Abs: 4.8 10*3/uL (ref 1.7–7.7)
Neutrophils Relative %: 77 %
Platelets: 276 10*3/uL (ref 150–400)
RBC: 5.54 MIL/uL (ref 4.22–5.81)
RDW: 11.7 % (ref 11.5–15.5)
WBC: 6.3 10*3/uL (ref 4.0–10.5)
nRBC: 0 % (ref 0.0–0.2)

## 2022-09-24 LAB — RAPID URINE DRUG SCREEN, HOSP PERFORMED
Amphetamines: POSITIVE — AB
Barbiturates: NOT DETECTED
Benzodiazepines: NOT DETECTED
Cocaine: NOT DETECTED
Opiates: NOT DETECTED
Tetrahydrocannabinol: NOT DETECTED

## 2022-09-24 LAB — COMPREHENSIVE METABOLIC PANEL
ALT: 58 U/L — ABNORMAL HIGH (ref 0–44)
ALT: 45 U/L — ABNORMAL HIGH (ref 0–44)
AST: 57 U/L — ABNORMAL HIGH (ref 15–41)
AST: 36 U/L (ref 15–41)
Albumin: 5.4 g/dL — ABNORMAL HIGH (ref 3.5–5.0)
Albumin: 4.2 g/dL (ref 3.5–5.0)
Alkaline Phosphatase: 86 U/L (ref 38–126)
Alkaline Phosphatase: 65 U/L (ref 38–126)
Anion gap: 18 — ABNORMAL HIGH (ref 5–15)
Anion gap: 11 (ref 5–15)
BUN: 12 mg/dL (ref 6–20)
BUN: 10 mg/dL (ref 6–20)
CO2: 20 mmol/L — ABNORMAL LOW (ref 22–32)
CO2: 22 mmol/L (ref 22–32)
Calcium: 10 mg/dL (ref 8.9–10.3)
Calcium: 9 mg/dL (ref 8.9–10.3)
Chloride: 100 mmol/L (ref 98–111)
Chloride: 107 mmol/L (ref 98–111)
Creatinine, Ser: 1.5 mg/dL — ABNORMAL HIGH (ref 0.61–1.24)
Creatinine, Ser: 1.19 mg/dL (ref 0.61–1.24)
GFR, Estimated: 60 mL/min — ABNORMAL LOW (ref >60)
GFR, Estimated: >60 mL/min (ref >60)
Glucose, Bld: 141 mg/dL — ABNORMAL HIGH (ref 70–99)
Glucose, Bld: 73 mg/dL (ref 70–99)
Potassium: 3.7 mmol/L (ref 3.5–5.1)
Potassium: 3.7 mmol/L (ref 3.5–5.1)
Sodium: 138 mmol/L (ref 135–145)
Sodium: 140 mmol/L (ref 135–145)
Total Bilirubin: 3.5 mg/dL — ABNORMAL HIGH (ref 0.3–1.2)
Total Bilirubin: 3.4 mg/dL — ABNORMAL HIGH (ref 0.3–1.2)
Total Protein: 9 g/dL — ABNORMAL HIGH (ref 6.5–8.1)
Total Protein: 7.3 g/dL (ref 6.5–8.1)

## 2022-09-24 LAB — URINALYSIS, ROUTINE W REFLEX MICROSCOPIC
Bilirubin Urine: NEGATIVE
Glucose, UA: NEGATIVE mg/dL
Hgb urine dipstick: NEGATIVE
Ketones, ur: 20 mg/dL — AB
Leukocytes,Ua: NEGATIVE
Nitrite: NEGATIVE
Protein, ur: NEGATIVE mg/dL
Specific Gravity, Urine: 1.01 (ref 1.005–1.030)
pH: 6 (ref 5.0–8.0)

## 2022-09-24 LAB — AMMONIA: Ammonia: 38 umol/L — ABNORMAL HIGH (ref 9–35)

## 2022-09-24 LAB — CK: Total CK: 216 U/L (ref 49–397)

## 2022-09-24 LAB — LIPASE, BLOOD: Lipase: 22 U/L (ref 11–51)

## 2022-09-24 LAB — CBG MONITORING, ED: Glucose-Capillary: 140 mg/dL — ABNORMAL HIGH (ref 70–99)

## 2022-09-24 LAB — TROPONIN I (HIGH SENSITIVITY)
Troponin I (High Sensitivity): 8 ng/L (ref <18)
Troponin I (High Sensitivity): 12 ng/L (ref <18)

## 2022-09-24 LAB — SALICYLATE LEVEL: Salicylate Lvl: <7 mg/dL — ABNORMAL LOW (ref 7.0–30.0)

## 2022-09-24 LAB — ETHANOL: Alcohol, Ethyl (B): <10 mg/dL (ref <10)

## 2022-09-24 LAB — TSH: TSH: 1.127 u[IU]/mL (ref 0.350–4.500)

## 2022-09-24 LAB — ACETAMINOPHEN LEVEL: Acetaminophen (Tylenol), Serum: <10 ug/mL — ABNORMAL LOW (ref 10–30)

## 2022-09-24 MED ORDER — LACTATED RINGERS IV BOLUS
1000.0000 mL | Freq: Once | INTRAVENOUS | Status: AC
  Administered 2022-09-24: 1000 mL via INTRAVENOUS

## 2022-09-24 MED ORDER — ZIPRASIDONE MESYLATE 20 MG IM SOLR: 20.0000 mg | Freq: Once | INTRAMUSCULAR | Status: AC

## 2022-09-24 MED ORDER — STERILE WATER FOR INJECTION IJ SOLN
INTRAMUSCULAR | Status: AC
  Administered 2022-09-24: 10 mL
  Filled 2022-09-24: qty 10

## 2022-09-24 MED ORDER — ZIPRASIDONE MESYLATE 20 MG IM SOLR
INTRAMUSCULAR | Status: AC
  Administered 2022-09-24: 20 mg via INTRAMUSCULAR
  Filled 2022-09-24: qty 20

## 2022-09-24 MED ORDER — LORAZEPAM 2 MG/ML IJ SOLN
INTRAMUSCULAR | Status: AC
  Administered 2022-09-24: 2 mg via INTRAVENOUS
  Filled 2022-09-24: qty 1

## 2022-09-24 MED ORDER — LORAZEPAM 2 MG/ML IJ SOLN: 2.0000 mg | Freq: Once | INTRAMUSCULAR | Status: AC

## 2022-09-24 NOTE — Discharge Instructions (Signed)
Do not take more medication than is prescribed.  You should avoid Benadryl given her seizure history.  Follow-up with your doctor regarding your elevated bilirubin.  Return to the ED with new or worsening symptoms.

## 2022-09-24 NOTE — ED Notes (Signed)
ED Provider at bedside. 

## 2022-09-24 NOTE — ED Notes (Signed)
Pt resting on stretcher calm, NAD. Equal chest rise and fall noted

## 2022-09-24 NOTE — ED Notes (Signed)
Pt given sandwich and ginger ale. Eating and drinking with no complications.

## 2022-09-24 NOTE — ED Notes (Signed)
Police states they are citing patient only. And to give him his court date paperwork, free to leave after DC.

## 2022-09-24 NOTE — ED Triage Notes (Signed)
Police called for trespassing, patient then became aggressive and fought Agricultural engineer. States he was acting in self defense. Does not state why he was there. Aox4. Denies SI/HI. Arrived in police custody

## 2022-09-24 NOTE — ED Provider Notes (Signed)
[place] EMERGENCY DEPARTMENT AT [facility] Provider Note   CSN: [id number] Arrival date & time: 09/24/22  [id number]     History  Chief Complaint  Patient presents with   Aggressive Behavior    Justin [name] is a 42 y.o. male.  Level 5 caveat for psychiatric illness.  Patient brought in by police in police custody.  He was reportedly trespassing and became aggressive and Social research officer, government.  He states he was acting in self defense.  On arrival he is tachycardic to the 170s and 180s.  He admits to heart palpitations but no chest pain or shortness of breath.  Denies any drug use.  States he did take "6 Benadryl" all at once but cannot say what time.  Denies any other drug use.  Denies any alcohol use.  He denies any headache, chest pain, shortness of breath, nausea, vomiting.  Denies feeling suicidal or homicidal.  States compliance with his HIV as well as seizure medications.  The history is provided by the patient and the police. The history is limited by the condition of the patient.       Home Medications Prior to Admission medications   Medication Sig Start Date End Date Taking? Authorizing Provider  bictegravir-emtricitabine-tenofovir AF (BIKTARVY) 50-200-25 MG TABS tablet Take 1 tablet by mouth daily. 08/17/22   Thayer Headings, MD  levETIRAcetam (KEPPRA) 750 MG tablet Take 2 tablets (1,500 mg total) by mouth 2 (two) times daily. 08/17/22   Comer, Okey Regal, MD  OLANZapine zydis (ZYPREXA) 5 MG disintegrating tablet Take 1 tablet (5 mg total) by mouth 2 (two) times daily. 08/17/22   Tharon Aquas, NP      Allergies    Patient has no known allergies.    Review of Systems   Review of Systems  Constitutional:  Negative for activity change, appetite change and fever.  HENT:  Negative for congestion.   Respiratory:  Negative for cough and shortness of breath.   Cardiovascular:  Negative for chest pain.  Gastrointestinal:  Negative for abdominal pain,  nausea and vomiting.  Genitourinary:  Negative for dysuria and hematuria.  Musculoskeletal:  Negative for arthralgias and myalgias.  Skin:  Negative for rash.  Neurological:  Negative for weakness and headaches.  Psychiatric/Behavioral:  Positive for behavioral problems and decreased concentration. The patient is hyperactive.    all other systems are negative except as noted in the HPI and PMH.    Physical Exam Updated Vital Signs BP 120/77   Pulse (!) 173   Temp 98.2 F (36.8 C) (Oral)   Resp (!) 24   SpO2 95%  Physical Exam Vitals and nursing note reviewed.  Constitutional:      General: He is not in acute distress.    Appearance: He is well-developed.     Comments: Agitated, in handcuffs  HENT:     Head: Normocephalic and atraumatic.     Mouth/Throat:     Pharynx: No oropharyngeal exudate.  Eyes:     Conjunctiva/sclera: Conjunctivae normal.     Pupils: Pupils are equal, round, and reactive to light.  Neck:     Comments: No meningismus. Cardiovascular:     Rate and Rhythm: Regular rhythm. Tachycardia present.     Heart sounds: Normal heart sounds. No murmur heard.    Comments: Regular tachycardia 170s and 180s Pulmonary:     Effort: Pulmonary effort is normal. No respiratory distress.     Breath sounds: Normal breath sounds.  Abdominal:  Palpations: Abdomen is soft.     Tenderness: There is no abdominal tenderness. There is no guarding or rebound.  Musculoskeletal:        General: No tenderness. Normal range of motion.     Cervical back: Normal range of motion and neck supple.  Skin:    General: Skin is warm.  Neurological:     Mental Status: He is alert and oriented to person, place, and time.     Cranial Nerves: No cranial nerve deficit.     Motor: No abnormal muscle tone.     Coordination: Coordination normal.     Comments:  5/5 strength throughout. CN 2-12 intact.Equal grip strength.   Psychiatric:        Behavior: Behavior normal.     ED Results /  Procedures / Treatments   Labs (all labs ordered are listed, but only abnormal results are displayed) Labs Reviewed  CBC WITH DIFFERENTIAL/PLATELET - Abnormal; Notable for the following components:      Result Value   Hemoglobin 18.0 (*)    HCT 53.6 (*)    All other components within normal limits  COMPREHENSIVE METABOLIC PANEL - Abnormal; Notable for the following components:   CO2 20 (*)    Glucose, Bld 141 (*)    Creatinine, Ser 1.50 (*)    Total Protein 9.0 (*)    Albumin 5.4 (*)    AST 57 (*)    ALT 58 (*)    Total Bilirubin 3.5 (*)    GFR, Estimated 60 (*)    Anion gap 18 (*)    All other components within normal limits  ACETAMINOPHEN LEVEL - Abnormal; Notable for the following components:   Acetaminophen (Tylenol), Serum <10 (*)    All other components within normal limits  SALICYLATE LEVEL - Abnormal; Notable for the following components:   Salicylate Lvl Q000111Q (*)    All other components within normal limits  AMMONIA - Abnormal; Notable for the following components:   Ammonia 38 (*)    All other components within normal limits  COMPREHENSIVE METABOLIC PANEL - Abnormal; Notable for the following components:   ALT 45 (*)    Total Bilirubin 3.4 (*)    All other components within normal limits  URINALYSIS, ROUTINE W REFLEX MICROSCOPIC - Abnormal; Notable for the following components:   Ketones, ur 20 (*)    All other components within normal limits  RAPID URINE DRUG SCREEN, HOSP PERFORMED - Abnormal; Notable for the following components:   Amphetamines POSITIVE (*)    All other components within normal limits  CBG MONITORING, ED - Abnormal; Notable for the following components:   Glucose-Capillary 140 (*)    All other components within normal limits  ETHANOL  LIPASE, BLOOD  CK  TSH  AMMONIA  TROPONIN I (HIGH SENSITIVITY)  TROPONIN I (HIGH SENSITIVITY)    EKG EKG Interpretation  Date/Time:  Saturday September 24 2022 14:07:47 EDT Ventricular Rate:  119 PR  Interval:  137 QRS Duration: 95 QT Interval:  339 QTC Calculation: 477 R Axis:   -20 Text Interpretation: Sinus tachycardia Borderline left axis deviation Abnormal R-wave progression, early transition Borderline prolonged QT interval No significant change was found Confirmed by Ezequiel Essex [phone]) on 09/24/2022 3:31:24 PM  Radiology US Abdomen Limited RUQ (LIVER/GB)  Result Date: 09/24/2022 CLINICAL DATA:  Elevated LFTs. EXAM: ULTRASOUND ABDOMEN LIMITED RIGHT UPPER QUADRANT COMPARISON:  Right upper quadrant ultrasound dated 08/06/2019. FINDINGS: Gallbladder: No gallstones or wall thickening visualized. No sonographic Murphy sign  noted by sonographer. Common bile duct: Diameter: 3 mm Liver: No focal lesion identified. Within normal limits in parenchymal echogenicity. Portal vein is patent on color Doppler imaging with normal direction of blood flow towards the liver. Other: None. IMPRESSION: Unremarkable right upper quadrant ultrasound. Electronically Signed   By: Anner Crete M.D.   On: 09/24/2022 15:12   DG Chest 2 View  Result Date: 09/24/2022 CLINICAL DATA:  hypoxia EXAM: CHEST - 2 VIEW COMPARISON:  12/24/2021 FINDINGS: Cardiac silhouette is unremarkable. No pneumothorax or pleural effusion. The lungs are clear. The visualized skeletal structures are unremarkable. IMPRESSION: No acute cardiopulmonary process. Electronically Signed   By: Sammie Bench M.D.   On: 09/24/2022 13:39    Procedures .Critical Care  Performed by: Ezequiel Essex, MD Authorized by: Ezequiel Essex, MD   Critical care provider statement:    Critical care time (minutes):  45   Critical care time was exclusive of:  Separately billable procedures and treating other patients   Critical care was necessary to treat or prevent imminent or life-threatening deterioration of the following conditions:  Toxidrome   Critical care was time spent personally by me on the following activities:  Development of treatment  plan with patient or surrogate, discussions with consultants, evaluation of patient's response to treatment, examination of patient, ordering and review of laboratory studies, ordering and review of radiographic studies, ordering and performing treatments and interventions, pulse oximetry, re-evaluation of patient's condition, review of old charts, blood draw for specimens and obtaining history from patient or surrogate   I assumed direction of critical care for this patient from another provider in my specialty: no       Medications Ordered in ED Medications  LORazepam (ATIVAN) 2 MG/ML injection (has no administration in time range)  LORazepam (ATIVAN) injection 2 mg (has no administration in time range)  ziprasidone (GEODON) injection 20 mg (has no administration in time range)  ziprasidone (GEODON) 20 MG injection (has no administration in time range)  sterile water (preservative free) injection (has no administration in time range)  lactated ringers bolus 1,000 mL (has no administration in time range)    ED Course/ Medical Decision Making/ A&P                             Medical Decision Making Amount and/or Complexity of Data Reviewed Labs: ordered. Decision-making details documented in ED Course. Radiology: ordered and independent interpretation performed. Decision-making details documented in ED Course. ECG/medicine tests: ordered and independent interpretation performed. Decision-making details documented in ED Course.  Risk Prescription drug management.  Patient brought in police custody with aggressive behavior, agitation, tachycardia and palpitations.  He denies chest pain or shortness of breath.  He is intermittently agitated and given chemical sedation for patient and staff safety.  EKG is sinus tachycardia with visible P waves.  Will give Ativan and Geodon. Rectal temp is normal. Low suspicion for sepsis.  Patient given IV fluids.  EKG is consistent with sinus  tachycardia.  Labs consistent with hemoconcentration and AKI.  CK is normal, lactate is normal, troponin is normal.  Mild transaminitis with elevated bilirubin.  Will recheck after hydration.  Patient resting comfortably after receiving Ativan and Geodon.  Unclear when his ingestion of Benadryl occurred.  Tachycardia is improving with rates of around 110.  At 12:00, patient is awake and alert and answering questions appropriately.  His heart rate has improved to the 90s.  He states he took "  6 Benadryl" around 6 AM because he was congested.  He denies any suicidal intent.  Denies feeling suicidal and homicidal currently.  Denies any chest pain or shortness of breath.  Discussed with poison control.  They recommend a period of 4 hours of observation or until patient is back to baseline.  His ingestion was around 6 AM.  Given his seizure history there is especially high concern for lowering the seizure threshold.  Patient's EKG intervals are normal.  He is urinating normally.  US obtained given elevated bilirubin. This is normal. HR improved to 90s. Repeat EKG with normal intervals.   Patient tolerating PO and ambulatory. >6 hours since ingestion. Appears back to baseline. Denies suicidal intent.   Followup with PCP regarding elevated bilirubin. UDS also positive for amphetamines may have contributed to presentation.   Appears stable for discharge in police custody        Final Clinical Impression(s) / ED Diagnoses Final diagnoses:  Aggressive behavior  Overdose of undetermined intent, initial encounter  Elevated bilirubin    Rx / DC Orders ED Discharge Orders     None         Graig Hessling, Annie Main, MD 09/24/22 1657

## 2022-09-24 NOTE — ED Notes (Signed)
[place] TECH IN ROOM

## 2022-09-24 NOTE — Progress Notes (Addendum)
TOC consulted for shelter and SA resources. This Probation officer provided and attached resources to the pt's AVS. EDP and RN notified via secure chat. No further TOC needs at this time. TOC signing off.

## 2022-09-24 NOTE — ED Notes (Signed)
Pt ambulating in room on his phone. Refusing CM sp02 and nibp. NAD noted.

## 2022-09-24 NOTE — ED Notes (Signed)
Police officers still at bedside. Pt on cm sp02 and nibp. Nasal cannula turned off. Satting wnl on RA

## 2022-09-24 NOTE — ED Notes (Signed)
Recollect on dark green ammonia sent to lab

## 2022-09-24 NOTE — ED Notes (Signed)
Placed on 2L [place] d/t desatting after medication

## 2022-09-24 NOTE — ED Notes (Addendum)
HR 170s on monitor. Pt denies any chest pain but states he can feel his heart racing

## 2022-09-24 NOTE — ED Notes (Signed)
Pt states he took 6 benadryl before arrival

## 2022-09-25 ENCOUNTER — Other Ambulatory Visit: Payer: Self-pay

## 2022-09-25 ENCOUNTER — Encounter (HOSPITAL_COMMUNITY): Payer: Self-pay

## 2022-09-25 ENCOUNTER — Emergency Department (HOSPITAL_COMMUNITY)
Admission: EM | Admit: 2022-09-25 | Discharge: 2022-09-25 | Disposition: A | Discharge status: Home / Self Care | Payer: Self-pay | Attending: Emergency Medicine | Admitting: Emergency Medicine

## 2022-09-25 DIAGNOSIS — F1910 Other psychoactive substance abuse, uncomplicated: Secondary | ICD-10-CM | POA: Insufficient documentation

## 2022-09-25 DIAGNOSIS — R7401 Elevation of levels of liver transaminase levels: Secondary | ICD-10-CM | POA: Insufficient documentation

## 2022-09-25 DIAGNOSIS — E876 Hypokalemia: Secondary | ICD-10-CM | POA: Insufficient documentation

## 2022-09-25 DIAGNOSIS — Z21 Asymptomatic human immunodeficiency virus [HIV] infection status: Secondary | ICD-10-CM | POA: Insufficient documentation

## 2022-09-25 DIAGNOSIS — R000 Tachycardia, unspecified: Secondary | ICD-10-CM | POA: Insufficient documentation

## 2022-09-25 DIAGNOSIS — G40909 Epilepsy, unspecified, not intractable, without status epilepticus: Secondary | ICD-10-CM | POA: Insufficient documentation

## 2022-09-25 LAB — CBC WITH DIFFERENTIAL/PLATELET
Abs Immature Granulocytes: 0.01 10*3/uL (ref 0.00–0.07)
Basophils Absolute: 0 10*3/uL (ref 0.0–0.1)
Basophils Relative: 0 %
Eosinophils Absolute: 0 10*3/uL (ref 0.0–0.5)
Eosinophils Relative: 0 %
HCT: 46.7 % (ref 39.0–52.0)
Hemoglobin: 15.7 g/dL (ref 13.0–17.0)
Immature Granulocytes: 0 %
Lymphocytes Relative: 26 %
Lymphs Abs: 2.2 10*3/uL (ref 0.7–4.0)
MCH: 32.8 pg (ref 26.0–34.0)
MCHC: 33.6 g/dL (ref 30.0–36.0)
MCV: 97.5 fL (ref 80.0–100.0)
Monocytes Absolute: 0.8 10*3/uL (ref 0.1–1.0)
Monocytes Relative: 10 %
Neutro Abs: 5.2 10*3/uL (ref 1.7–7.7)
Neutrophils Relative %: 64 %
Platelets: 234 10*3/uL (ref 150–400)
RBC: 4.79 MIL/uL (ref 4.22–5.81)
RDW: 11.9 % (ref 11.5–15.5)
WBC: 8.2 10*3/uL (ref 4.0–10.5)
nRBC: 0 % (ref 0.0–0.2)

## 2022-09-25 LAB — COMPREHENSIVE METABOLIC PANEL
ALT: 46 U/L — ABNORMAL HIGH (ref 0–44)
AST: 47 U/L — ABNORMAL HIGH (ref 15–41)
Albumin: 4.9 g/dL (ref 3.5–5.0)
Alkaline Phosphatase: 68 U/L (ref 38–126)
Anion gap: 12 (ref 5–15)
BUN: 15 mg/dL (ref 6–20)
CO2: 21 mmol/L — ABNORMAL LOW (ref 22–32)
Calcium: 9 mg/dL (ref 8.9–10.3)
Chloride: 104 mmol/L (ref 98–111)
Creatinine, Ser: 1.09 mg/dL (ref 0.61–1.24)
GFR, Estimated: >60 mL/min (ref >60)
Glucose, Bld: 76 mg/dL (ref 70–99)
Potassium: 3.2 mmol/L — ABNORMAL LOW (ref 3.5–5.1)
Sodium: 137 mmol/L (ref 135–145)
Total Bilirubin: 3.4 mg/dL — ABNORMAL HIGH (ref 0.3–1.2)
Total Protein: 7.8 g/dL (ref 6.5–8.1)

## 2022-09-25 LAB — ETHANOL: Alcohol, Ethyl (B): <10 mg/dL (ref <10)

## 2022-09-25 MED ORDER — LEVETIRACETAM 500 MG PO TABS
1000.0000 mg | ORAL_TABLET | Freq: Once | ORAL | Status: AC
  Administered 2022-09-25: 1000 mg via ORAL
  Filled 2022-09-25: qty 2
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
## 2022-09-25 NOTE — ED Provider Notes (Signed)
[place] EMERGENCY DEPARTMENT AT [facility] Provider Note   CSN: [id number] Arrival date & time: 09/25/22  1829     History {Add pertinent medical, surgical, social history, OB history to HPI:1} Chief Complaint  Patient presents with   Psychiatric Evaluation    Justin [name] is a 42 y.o. male.  HPI     Home Medications Prior to Admission medications   Medication Sig Start Date End Date Taking? Authorizing Provider  bictegravir-emtricitabine-tenofovir AF (BIKTARVY) 50-200-25 MG TABS tablet Take 1 tablet by mouth daily. 08/17/22   Thayer Headings, MD  escitalopram (LEXAPRO) 10 MG tablet Take 10 mg by mouth daily. 09/15/22   [provider]  levETIRAcetam (KEPPRA) 750 MG tablet Take 2 tablets (1,500 mg total) by mouth 2 (two) times daily. 08/17/22   Comer, Okey Regal, MD  OLANZapine zydis (ZYPREXA) 5 MG disintegrating tablet Take 1 tablet (5 mg total) by mouth 2 (two) times daily. 08/17/22   Tharon Aquas, NP      Allergies    Patient has no known allergies.    Review of Systems   Review of Systems  Physical Exam Updated Vital Signs BP (!) 119/94 (BP Location: Left Arm)   Pulse (!) 103   Temp 98.5 F (36.9 C) (Oral)   Resp 18   Ht 6' (1.829 m)   Wt 99.8 kg   SpO2 98%   BMI 29.84 kg/m  Physical Exam  ED Results / Procedures / Treatments   Labs (all labs ordered are listed, but only abnormal results are displayed) Labs Reviewed  COMPREHENSIVE METABOLIC PANEL - Abnormal; Notable for the following components:      Result Value   Potassium 3.2 (*)    CO2 21 (*)    AST 47 (*)    ALT 46 (*)    Total Bilirubin 3.4 (*)    All other components within normal limits  ETHANOL  CBC WITH DIFFERENTIAL/PLATELET  LEVETIRACETAM LEVEL    EKG None  Radiology US Abdomen Limited RUQ (LIVER/GB)  Result Date: 09/24/2022 CLINICAL DATA:  Elevated LFTs. EXAM: ULTRASOUND ABDOMEN LIMITED RIGHT UPPER QUADRANT COMPARISON:  Right upper quadrant  ultrasound dated 08/06/2019. FINDINGS: Gallbladder: No gallstones or wall thickening visualized. No sonographic Murphy sign noted by sonographer. Common bile duct: Diameter: 3 mm Liver: No focal lesion identified. Within normal limits in parenchymal echogenicity. Portal vein is patent on color Doppler imaging with normal direction of blood flow towards the liver. Other: None. IMPRESSION: Unremarkable right upper quadrant ultrasound. Electronically Signed   By: Anner Crete M.D.   On: 09/24/2022 15:12   DG Chest 2 View  Result Date: 09/24/2022 CLINICAL DATA:  hypoxia EXAM: CHEST - 2 VIEW COMPARISON:  12/24/2021 FINDINGS: Cardiac silhouette is unremarkable. No pneumothorax or pleural effusion. The lungs are clear. The visualized skeletal structures are unremarkable. IMPRESSION: No acute cardiopulmonary process. Electronically Signed   By: Sammie Bench M.D.   On: 09/24/2022 13:39    Procedures Procedures  {Document cardiac monitor, telemetry assessment procedure when appropriate:1}  Medications Ordered in ED Medications  levETIRAcetam (KEPPRA) tablet 1,000 mg (1,000 mg Oral Given 09/25/22 2148)    ED Course/ Medical Decision Making/ A&P   {   Click here for ABCD2, HEART and other calculatorsREFRESH Note before signing :1}                          Medical Decision Making Amount and/or Complexity of Data Reviewed Labs:  ordered.  Risk Prescription drug management.   ***  {Document critical care time when appropriate:1} {Document review of labs and clinical decision tools ie heart score, Chads2Vasc2 etc:1}  {Document your independent review of radiology images, and any outside records:1} {Document your discussion with family members, caretakers, and with consultants:1} {Document social determinants of health affecting pt's care:1} {Document your decision making why or why not admission, treatments were needed:1} Final Clinical Impression(s) / ED Diagnoses Final diagnoses:   Polysubstance abuse ([place])  Seizure disorder ([place])    Rx / DC Orders ED Discharge Orders     None

## 2022-09-25 NOTE — Discharge Instructions (Addendum)
Thank you for allowing me to be a part of your care today.  Please take your medication as prescribed, especially your seizure medication, and follow-up with your neurologist.  I have attached resources for counseling and substance abuse.    Return to the ED if you have worsening of symptoms or if you have any new concerns.

## 2022-09-25 NOTE — ED Notes (Signed)
Patient left before paperwork provided

## 2022-09-26 NOTE — ED Provider Notes (Incomplete)
[name] EMERGENCY DEPARTMENT AT [facility] Provider Note   CSN: [id number] Arrival date & time: 09/25/22  1829     History {Add pertinent medical, surgical, social history, OB history to HPI:1} Chief Complaint  Patient presents with  . Psychiatric Evaluation    Justin [name] is a 42 y.o. male with past medical history significant for seizure disorder, adjustment disorder, substance use disorder, MDD with psychotic features was brought to the ED by EMS.  Patient states that he was in the bathroom and awoke to find EMS there.  He states "I do not understand why they could not have left me in the bathroom".  When asked about drug or alcohol use, patient states "I have made some bad decisions, lets just leave it at that".  Patient also admits to not taking his seizure medication consistently.  Patient was seen in ED yesterday for aggressive behavior and suspected overdose.      Home Medications Prior to Admission medications   Medication Sig Start Date End Date Taking? Authorizing Provider  bictegravir-emtricitabine-tenofovir AF (BIKTARVY) 50-200-25 MG TABS tablet Take 1 tablet by mouth daily. 08/17/22   Thayer Headings, MD  escitalopram (LEXAPRO) 10 MG tablet Take 10 mg by mouth daily. 09/15/22   [provider]  levETIRAcetam (KEPPRA) 750 MG tablet Take 2 tablets (1,500 mg total) by mouth 2 (two) times daily. 08/17/22   Comer, Okey Regal, MD  OLANZapine zydis (ZYPREXA) 5 MG disintegrating tablet Take 1 tablet (5 mg total) by mouth 2 (two) times daily. 08/17/22   Tharon Aquas, NP      Allergies    Patient has no known allergies.    Review of Systems   Review of Systems  Physical Exam Updated Vital Signs BP (!) 119/94 (BP Location: Left Arm)   Pulse (!) 103   Temp 98.5 F (36.9 C) (Oral)   Resp 18   Ht 6' (1.829 m)   Wt 99.8 kg   SpO2 98%   BMI 29.84 kg/m  Physical Exam  ED Results / Procedures / Treatments   Labs (all labs ordered are  listed, but only abnormal results are displayed) Labs Reviewed  COMPREHENSIVE METABOLIC PANEL - Abnormal; Notable for the following components:      Result Value   Potassium 3.2 (*)    CO2 21 (*)    AST 47 (*)    ALT 46 (*)    Total Bilirubin 3.4 (*)    All other components within normal limits  ETHANOL  CBC WITH DIFFERENTIAL/PLATELET  LEVETIRACETAM LEVEL    EKG None  Radiology US Abdomen Limited RUQ (LIVER/GB)  Result Date: 09/24/2022 CLINICAL DATA:  Elevated LFTs. EXAM: ULTRASOUND ABDOMEN LIMITED RIGHT UPPER QUADRANT COMPARISON:  Right upper quadrant ultrasound dated 08/06/2019. FINDINGS: Gallbladder: No gallstones or wall thickening visualized. No sonographic Murphy sign noted by sonographer. Common bile duct: Diameter: 3 mm Liver: No focal lesion identified. Within normal limits in parenchymal echogenicity. Portal vein is patent on color Doppler imaging with normal direction of blood flow towards the liver. Other: None. IMPRESSION: Unremarkable right upper quadrant ultrasound. Electronically Signed   By: Anner Crete M.D.   On: 09/24/2022 15:12   DG Chest 2 View  Result Date: 09/24/2022 CLINICAL DATA:  hypoxia EXAM: CHEST - 2 VIEW COMPARISON:  12/24/2021 FINDINGS: Cardiac silhouette is unremarkable. No pneumothorax or pleural effusion. The lungs are clear. The visualized skeletal structures are unremarkable. IMPRESSION: No acute cardiopulmonary process. Electronically Signed   By: Vonna Kotyk  Pleasure M.D.   On: 09/24/2022 13:39    Procedures Procedures  {Document cardiac monitor, telemetry assessment procedure when appropriate:1}  Medications Ordered in ED Medications  levETIRAcetam (KEPPRA) tablet 1,000 mg (1,000 mg Oral Given 09/25/22 2148)    ED Course/ Medical Decision Making/ A&P   {   Click here for ABCD2, HEART and other calculatorsREFRESH Note before signing :1}                          Medical Decision Making Amount and/or Complexity of Data Reviewed Labs:  ordered.  Risk Prescription drug management.   ***  {Document critical care time when appropriate:1} {Document review of labs and clinical decision tools ie heart score, Chads2Vasc2 etc:1}  {Document your independent review of radiology images, and any outside records:1} {Document your discussion with family members, caretakers, and with consultants:1} {Document social determinants of health affecting pt's care:1} {Document your decision making why or why not admission, treatments were needed:1} Final Clinical Impression(s) / ED Diagnoses Final diagnoses:  Polysubstance abuse ([place])  Seizure disorder ([place])    Rx / DC Orders ED Discharge Orders     None

## 2022-09-27 LAB — LEVETIRACETAM LEVEL: Levetiracetam Lvl: 31 ug/mL (ref 10.0–40.0)

## 2022-09-30 ENCOUNTER — Ambulatory Visit: Payer: Self-pay | Admitting: Neurology

## 2022-10-06 ENCOUNTER — Emergency Department (HOSPITAL_COMMUNITY)
Admission: EM | Admit: 2022-10-06 | Discharge: 2022-10-06 | Attending: Emergency Medicine | Admitting: Emergency Medicine

## 2022-10-06 ENCOUNTER — Encounter (HOSPITAL_COMMUNITY): Payer: Self-pay

## 2022-10-06 ENCOUNTER — Emergency Department (HOSPITAL_COMMUNITY)

## 2022-10-06 DIAGNOSIS — R569 Unspecified convulsions: Secondary | ICD-10-CM | POA: Diagnosis present

## 2022-10-06 LAB — I-STAT CHEM 8, ED
BUN: 8 mg/dL (ref 6–20)
Calcium, Ion: 1.16 mmol/L (ref 1.15–1.40)
Chloride: 105 mmol/L (ref 98–111)
Creatinine, Ser: 1.1 mg/dL (ref 0.61–1.24)
Glucose, Bld: 78 mg/dL (ref 70–99)
HCT: 47 % (ref 39.0–52.0)
Hemoglobin: 16 g/dL (ref 13.0–17.0)
Potassium: 4.4 mmol/L (ref 3.5–5.1)
Sodium: 141 mmol/L (ref 135–145)
TCO2: 26 mmol/L (ref 22–32)

## 2022-10-06 MED ORDER — LACTATED RINGERS IV SOLN: INTRAVENOUS | Status: DC

## 2022-10-06 MED ORDER — LEVETIRACETAM IN NACL 1000 MG/100ML IV SOLN
1000.0000 mg | Freq: Once | INTRAVENOUS | Status: AC
  Administered 2022-10-06: 1000 mg via INTRAVENOUS
  Filled 2022-10-06: qty 100

## 2022-10-06 MED ORDER — ACETAMINOPHEN 325 MG PO TABS
650.0000 mg | ORAL_TABLET | Freq: Once | ORAL | Status: AC
  Administered 2022-10-06: 650 mg via ORAL
  Filled 2022-10-06: qty 2

## 2022-10-06 NOTE — ED Provider Notes (Signed)
[place] EMERGENCY DEPARTMENT AT [facility] Provider Note   CSN: [id number] Arrival date & time: 10/06/22  1117     History  Chief Complaint  Patient presents with   Seizures    Justin [name] is a 42 y.o. male.  42 year old male presents after having a witnessed seizure just prior to arrival.  He is currently incarcerated.  Patient does have history of seizure disorder and normally takes Keppra 1500 mg twice daily.  This was confirmed by looking at patient's old medical records.  According to the patient, he is been on only 500 mg twice daily.  Patient did bite his upper lip.  Feels back to his baseline at this time.       Home Medications Prior to Admission medications   Medication Sig Start Date End Date Taking? Authorizing Provider  bictegravir-emtricitabine-tenofovir AF (BIKTARVY) 50-200-25 MG TABS tablet Take 1 tablet by mouth daily. 08/17/22   Thayer Headings, MD  escitalopram (LEXAPRO) 10 MG tablet Take 10 mg by mouth daily. 09/15/22   [provider]  levETIRAcetam (KEPPRA) 750 MG tablet Take 2 tablets (1,500 mg total) by mouth 2 (two) times daily. 08/17/22   Comer, Okey Regal, MD  OLANZapine zydis (ZYPREXA) 5 MG disintegrating tablet Take 1 tablet (5 mg total) by mouth 2 (two) times daily. 08/17/22   Tharon Aquas, NP      Allergies    Patient has no known allergies.    Review of Systems   Review of Systems  All other systems reviewed and are negative.   Physical Exam Updated Vital Signs BP 132/74   Pulse 70   Temp 98 F (36.7 C)   Resp 15   SpO2 100%  Physical Exam Vitals and nursing note reviewed.  Constitutional:      General: He is not in acute distress.    Appearance: Normal appearance. He is well-developed. He is not toxic-appearing.  HENT:     Head: Normocephalic and atraumatic.  Eyes:     General: Lids are normal.     Conjunctiva/sclera: Conjunctivae normal.     Pupils: Pupils are equal, round, and reactive to light.   Neck:     Thyroid: No thyroid mass.     Trachea: No tracheal deviation.  Cardiovascular:     Rate and Rhythm: Normal rate and regular rhythm.     Heart sounds: Normal heart sounds. No murmur heard.    No gallop.  Pulmonary:     Effort: Pulmonary effort is normal. No respiratory distress.     Breath sounds: Normal breath sounds. No stridor. No decreased breath sounds, wheezing, rhonchi or rales.  Abdominal:     General: There is no distension.     Palpations: Abdomen is soft.     Tenderness: There is no abdominal tenderness. There is no rebound.  Musculoskeletal:        General: No tenderness. Normal range of motion.     Cervical back: Normal range of motion and neck supple.  Skin:    General: Skin is warm and dry.     Findings: No abrasion or rash.  Neurological:     General: No focal deficit present.     Mental Status: He is alert and oriented to person, place, and time. Mental status is at baseline.     GCS: GCS eye subscore is 4. GCS verbal subscore is 5. GCS motor subscore is 6.     Cranial Nerves: No cranial nerve deficit.  Sensory: No sensory deficit.     Motor: Motor function is intact.  Psychiatric:        Attention and Perception: Attention normal.        Speech: Speech normal.        Behavior: Behavior normal.     ED Results / Procedures / Treatments   Labs (all labs ordered are listed, but only abnormal results are displayed) Labs Reviewed - No data to display  EKG None  Radiology No results found.  Procedures Procedures    Medications Ordered in ED Medications  lactated ringers infusion (has no administration in time range)  levETIRAcetam (KEPPRA) IVPB 1000 mg/100 mL premix (has no administration in time range)    ED Course/ Medical Decision Making/ A&P                             Medical Decision Making Amount and/or Complexity of Data Reviewed Radiology: ordered.  Risk Prescription drug management.  Electrolytes within normal  limits. Head CT per interpretation shows no acute findings.  Review of patient's old record shows that patient should be on 1500 mg of Keppra twice daily.  He has been only on 5 mg.  Was given 1 g of Keppra here.  Will communicate his discharge instructions to jail staff to increase his dose of Keppra.        Final Clinical Impression(s) / ED Diagnoses Final diagnoses:  None    Rx / DC Orders ED Discharge Orders     None         Lacretia Leigh, MD 10/06/22 1450
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
## 2022-10-06 NOTE — Discharge Instructions (Signed)
Please make sure that the patient takes 1500 mg of Keppra twice a day as this is his proper dosage

## 2022-11-16 ENCOUNTER — Other Ambulatory Visit: Payer: Self-pay

## 2022-11-16 DIAGNOSIS — B20 Human immunodeficiency virus [HIV] disease: Secondary | ICD-10-CM

## 2022-11-16 MED ORDER — BICTEGRAVIR-EMTRICITAB-TENOFOV 50-200-25 MG PO TABS: 1.0000 | ORAL_TABLET | Freq: Every day | ORAL | 4 refills | Status: DC

## 2022-11-29 ENCOUNTER — Other Ambulatory Visit (HOSPITAL_COMMUNITY): Payer: Self-pay

## 2022-12-01 ENCOUNTER — Other Ambulatory Visit: Payer: Self-pay

## 2022-12-01 DIAGNOSIS — F323 Major depressive disorder, single episode, severe with psychotic features: Secondary | ICD-10-CM

## 2022-12-01 MED ORDER — ESCITALOPRAM OXALATE 10 MG PO TABS
10.0000 mg | ORAL_TABLET | Freq: Every day | ORAL | 2 refills | Status: AC
Start: 2022-12-01 — End: ?

## 2022-12-15 NOTE — Progress Notes (Signed)
The 10-year ASCVD risk score (Arnett DK, et al., 2019) is: 5.1%   Values used to calculate the score:     Age: 42 years     Sex: Male     Is Non-Hispanic African American: Yes     Diabetic: No     Tobacco smoker: Yes     Systolic Blood Pressure: 130 mmHg     Is BP treated: No     HDL Cholesterol: 71 mg/dL     Total Cholesterol: 181 mg/dL  Sandie Ano, RN

## 2022-12-21 ENCOUNTER — Other Ambulatory Visit: Payer: Self-pay | Admitting: Internal Medicine

## 2022-12-21 DIAGNOSIS — B20 Human immunodeficiency virus [HIV] disease: Secondary | ICD-10-CM

## 2023-01-03 ENCOUNTER — Other Ambulatory Visit: Payer: Self-pay

## 2023-01-03 ENCOUNTER — Emergency Department (HOSPITAL_COMMUNITY): Payer: Self-pay

## 2023-01-03 ENCOUNTER — Encounter (HOSPITAL_COMMUNITY): Payer: Self-pay

## 2023-01-03 ENCOUNTER — Emergency Department (HOSPITAL_COMMUNITY)
Admission: EM | Admit: 2023-01-03 | Discharge: 2023-01-03 | Disposition: A | Discharge status: Home / Self Care | Payer: Self-pay | Attending: Emergency Medicine | Admitting: Emergency Medicine

## 2023-01-03 DIAGNOSIS — W3400XA Accidental discharge from unspecified firearms or gun, initial encounter: Secondary | ICD-10-CM

## 2023-01-03 DIAGNOSIS — S31803A Puncture wound without foreign body of unspecified buttock, initial encounter: Secondary | ICD-10-CM | POA: Insufficient documentation

## 2023-01-03 LAB — I-STAT CHEM 8, ED
BUN: 10 mg/dL (ref 6–20)
Calcium, Ion: 1.2 mmol/L (ref 1.15–1.40)
Chloride: 104 mmol/L (ref 98–111)
Creatinine, Ser: 1.1 mg/dL (ref 0.61–1.24)
Glucose, Bld: 116 mg/dL — ABNORMAL HIGH (ref 70–99)
HCT: 51 % (ref 39.0–52.0)
Hemoglobin: 17.3 g/dL — ABNORMAL HIGH (ref 13.0–17.0)
Potassium: 3.6 mmol/L (ref 3.5–5.1)
Sodium: 138 mmol/L (ref 135–145)
TCO2: 21 mmol/L — ABNORMAL LOW (ref 22–32)

## 2023-01-03 NOTE — ED Notes (Signed)
Pt refusing IV and CT. MD notified
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
## 2023-01-04 ENCOUNTER — Other Ambulatory Visit: Payer: Self-pay

## 2023-01-04 ENCOUNTER — Emergency Department (HOSPITAL_COMMUNITY)
Admission: EM | Admit: 2023-01-04 | Discharge: 2023-01-05 | Disposition: A | Discharge status: Home / Self Care | Payer: Self-pay | Attending: Emergency Medicine | Admitting: Emergency Medicine

## 2023-01-04 ENCOUNTER — Encounter (HOSPITAL_COMMUNITY): Payer: Self-pay

## 2023-01-04 ENCOUNTER — Emergency Department (HOSPITAL_COMMUNITY): Payer: Self-pay

## 2023-01-04 ENCOUNTER — Encounter (HOSPITAL_COMMUNITY): Payer: Self-pay | Admitting: Emergency Medicine

## 2023-01-04 DIAGNOSIS — R569 Unspecified convulsions: Secondary | ICD-10-CM | POA: Insufficient documentation

## 2023-01-04 LAB — CBC WITH DIFFERENTIAL/PLATELET
Abs Immature Granulocytes: 0.06 10*3/uL (ref 0.00–0.07)
Basophils Absolute: 0 10*3/uL (ref 0.0–0.1)
Basophils Relative: 0 %
Eosinophils Absolute: 0 10*3/uL (ref 0.0–0.5)
Eosinophils Relative: 0 %
HCT: 45 % (ref 39.0–52.0)
Hemoglobin: 14.7 g/dL (ref 13.0–17.0)
Immature Granulocytes: 1 %
Lymphocytes Relative: 11 %
Lymphs Abs: 0.9 10*3/uL (ref 0.7–4.0)
MCH: 32 pg (ref 26.0–34.0)
MCHC: 32.7 g/dL (ref 30.0–36.0)
MCV: 98 fL (ref 80.0–100.0)
Monocytes Absolute: 0.8 10*3/uL (ref 0.1–1.0)
Monocytes Relative: 10 %
Neutro Abs: 6.3 10*3/uL (ref 1.7–7.7)
Neutrophils Relative %: 78 %
Platelets: 199 10*3/uL (ref 150–400)
RBC: 4.59 MIL/uL (ref 4.22–5.81)
RDW: 13.1 % (ref 11.5–15.5)
WBC: 8 10*3/uL (ref 4.0–10.5)
nRBC: 0 % (ref 0.0–0.2)

## 2023-01-04 LAB — I-STAT CHEM 8, ED
BUN: 19 mg/dL (ref 6–20)
Calcium, Ion: 0.89 mmol/L — CL (ref 1.15–1.40)
Chloride: 105 mmol/L (ref 98–111)
Creatinine, Ser: 1.2 mg/dL (ref 0.61–1.24)
Glucose, Bld: 74 mg/dL (ref 70–99)
HCT: 45 % (ref 39.0–52.0)
Hemoglobin: 15.3 g/dL (ref 13.0–17.0)
Potassium: 5.3 mmol/L — ABNORMAL HIGH (ref 3.5–5.1)
Sodium: 131 mmol/L — ABNORMAL LOW (ref 135–145)
TCO2: 17 mmol/L — ABNORMAL LOW (ref 22–32)

## 2023-01-04 LAB — CBG MONITORING, ED: Glucose-Capillary: 131 mg/dL — ABNORMAL HIGH (ref 70–99)

## 2023-01-04 LAB — AMMONIA: Ammonia: 37 umol/L — ABNORMAL HIGH (ref 9–35)

## 2023-01-04 MED ORDER — LEVETIRACETAM IN NACL 1000 MG/100ML IV SOLN
1000.0000 mg | Freq: Once | INTRAVENOUS | Status: AC
  Administered 2023-01-04: 1000 mg via INTRAVENOUS
  Filled 2023-01-04: qty 100

## 2023-01-04 MED ORDER — OXYCODONE-ACETAMINOPHEN 5-325 MG PO TABS
1.0000 | ORAL_TABLET | Freq: Once | ORAL | Status: AC
  Administered 2023-01-04: 1 via ORAL
  Filled 2023-01-04: qty 1

## 2023-01-04 MED ORDER — SODIUM CHLORIDE 0.9 % IV SOLN: 4400.0000 mg | Freq: Once | INTRAVENOUS | Status: DC

## 2023-01-04 MED ORDER — SODIUM CHLORIDE 0.9 % IV BOLUS
1000.0000 mL | Freq: Once | INTRAVENOUS | Status: AC
  Administered 2023-01-04: 1000 mL via INTRAVENOUS

## 2023-01-04 MED ORDER — IOHEXOL 350 MG/ML SOLN
75.0000 mL | Freq: Once | INTRAVENOUS | Status: AC | PRN
  Administered 2023-01-04: 75 mL via INTRAVENOUS

## 2023-01-04 MED ORDER — LEVETIRACETAM IN NACL 1500 MG/100ML IV SOLN
1500.0000 mg | Freq: Once | INTRAVENOUS | Status: AC
  Administered 2023-01-04: 1500 mg via INTRAVENOUS
  Filled 2023-01-04: qty 100
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
## 2023-01-05 ENCOUNTER — Other Ambulatory Visit: Payer: Self-pay

## 2023-01-05 ENCOUNTER — Emergency Department (HOSPITAL_COMMUNITY)
Admission: EM | Admit: 2023-01-05 | Discharge: 2023-01-06 | Disposition: A | Discharge status: Other Acute Inpatient Hospital | Payer: No Payment, Other | Attending: Emergency Medicine | Admitting: Emergency Medicine

## 2023-01-05 DIAGNOSIS — F1994 Other psychoactive substance use, unspecified with psychoactive substance-induced mood disorder: Secondary | ICD-10-CM | POA: Diagnosis present

## 2023-01-05 DIAGNOSIS — F323 Major depressive disorder, single episode, severe with psychotic features: Secondary | ICD-10-CM | POA: Diagnosis present

## 2023-01-05 DIAGNOSIS — F4329 Adjustment disorder with other symptoms: Secondary | ICD-10-CM | POA: Diagnosis present

## 2023-01-05 DIAGNOSIS — E871 Hypo-osmolality and hyponatremia: Secondary | ICD-10-CM | POA: Insufficient documentation

## 2023-01-05 DIAGNOSIS — Z21 Asymptomatic human immunodeficiency virus [HIV] infection status: Secondary | ICD-10-CM | POA: Insufficient documentation

## 2023-01-05 DIAGNOSIS — R45851 Suicidal ideations: Secondary | ICD-10-CM

## 2023-01-05 DIAGNOSIS — T50902A Poisoning by unspecified drugs, medicaments and biological substances, intentional self-harm, initial encounter: Secondary | ICD-10-CM

## 2023-01-05 DIAGNOSIS — R569 Unspecified convulsions: Secondary | ICD-10-CM

## 2023-01-05 LAB — RAPID URINE DRUG SCREEN, HOSP PERFORMED
Amphetamines: POSITIVE — AB
Barbiturates: NOT DETECTED
Benzodiazepines: POSITIVE — AB
Cocaine: NOT DETECTED
Opiates: NOT DETECTED
Tetrahydrocannabinol: NOT DETECTED

## 2023-01-05 LAB — CBC
HCT: 48.6 % (ref 39.0–52.0)
Hemoglobin: 16.2 g/dL (ref 13.0–17.0)
MCH: 32.7 pg (ref 26.0–34.0)
MCHC: 33.3 g/dL (ref 30.0–36.0)
MCV: 98.2 fL (ref 80.0–100.0)
Platelets: 173 10*3/uL (ref 150–400)
RBC: 4.95 MIL/uL (ref 4.22–5.81)
RDW: 13 % (ref 11.5–15.5)
WBC: 9 10*3/uL (ref 4.0–10.5)
nRBC: 0 % (ref 0.0–0.2)

## 2023-01-05 LAB — BASIC METABOLIC PANEL
Anion gap: 13 (ref 5–15)
BUN: 11 mg/dL (ref 6–20)
CO2: 18 mmol/L — ABNORMAL LOW (ref 22–32)
Calcium: 8.5 mg/dL — ABNORMAL LOW (ref 8.9–10.3)
Chloride: 101 mmol/L (ref 98–111)
Creatinine, Ser: 1.07 mg/dL (ref 0.61–1.24)
GFR, Estimated: >60 mL/min (ref >60)
Glucose, Bld: 79 mg/dL (ref 70–99)
Potassium: 3.8 mmol/L (ref 3.5–5.1)
Sodium: 132 mmol/L — ABNORMAL LOW (ref 135–145)

## 2023-01-05 LAB — ETHANOL: Alcohol, Ethyl (B): <10 mg/dL (ref <10)

## 2023-01-05 LAB — CBG MONITORING, ED: Glucose-Capillary: 68 mg/dL — ABNORMAL LOW (ref 70–99)

## 2023-01-05 LAB — SALICYLATE LEVEL: Salicylate Lvl: <7 mg/dL — ABNORMAL LOW (ref 7.0–30.0)

## 2023-01-05 LAB — ACETAMINOPHEN LEVEL
Acetaminophen (Tylenol), Serum: <10 ug/mL — ABNORMAL LOW (ref 10–30)
Acetaminophen (Tylenol), Serum: <10 ug/mL — ABNORMAL LOW (ref 10–30)

## 2023-01-05 MED ORDER — LACTATED RINGERS IV SOLN: INTRAVENOUS | Status: DC

## 2023-01-05 MED ORDER — SODIUM CHLORIDE 0.9 % IV BOLUS: 500.0000 mL | Freq: Once | INTRAVENOUS | Status: DC

## 2023-01-05 MED ORDER — LEVETIRACETAM 500 MG PO TABS
1500.0000 mg | ORAL_TABLET | Freq: Once | ORAL | Status: AC
  Administered 2023-01-05: 1500 mg via ORAL
  Filled 2023-01-05: qty 3

## 2023-01-05 NOTE — Discharge Instructions (Addendum)
You are seen today for seizures.  Ensure that you are taking your seizure medications as directed.  Follow-up with your neurologist.
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
## 2023-01-05 NOTE — ED Notes (Signed)
Urinal at bedside.  
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
## 2023-01-05 NOTE — ED Notes (Signed)
Pt awake and staff explained what happened to him and tried to call the only number in his chart with no success. Pt given belongings to get dressed. Pt given gingerale, crackers and bus pass. Pt seen walking out of emergency room with no difficulty. Resp E/U.

## 2023-01-05 NOTE — ED Notes (Signed)
Poison control called to check on patient. Will call again later to check on patient. Will continue to monitor.

## 2023-01-05 NOTE — Discharge Instructions (Signed)
It was a pleasure caring for you today.  Your lab work was reassuring.  I have included information for resources for housing in this discharge paperwork.   seek emergency care if experiencing any new or worsening symptoms.

## 2023-01-05 NOTE — ED Provider Notes (Signed)
Pt initially seen for possible seizures.  Pt then reported SI.   When pt was going to be discharged he ingested his keppra tablets.  He will need to be monitored until 7pm.  7:28 PM Pt has remained.  Vitals normally.  Medically cleared at this time.   Linwood Dibbles, MD 01/05/23 [phone]

## 2023-01-05 NOTE — ED Notes (Signed)
Provider has medically cleared him, Pt cooperative w/ staff obtaining blood.  Pt given two sandwiches and drink.  Pt asked about IVC status.  RN explained that by taking the pills we had no options.  "That's fair."  Sitter bedside

## 2023-01-05 NOTE — ED Notes (Signed)
Patient does not have IV access. RN attempted and was unsuccessful, Ultrasound IV unsuccessful, patient now refusing IV access
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
## 2023-01-05 NOTE — ED Notes (Signed)
Changed out but still needs to put on scrub pants but did not want to get up to do that.  2 pt belonging bags

## 2023-01-05 NOTE — ED Provider Notes (Signed)
Patient signed out pending observation overnight.  In brief, presented with recurrent seizure.  Signout was to observe patient overnight.  If no recurrent seizure, he can be discharged home in the morning with his seizure medications.  On my evaluation he is awake, alert, oriented.  States he feels fine.  No recurrent seizures. Physical Exam  BP 125/78 (BP Location: Left Arm)   Pulse 94   Temp 98.1 F (36.7 C) (Oral)   Resp 18   Ht 1.829 m (6')   Wt 90.7 kg   SpO2 98%   BMI 27.12 kg/m   Physical Exam Awake,  alert, oriented. Procedures  Procedures  ED Course / MDM    Medical Decision Making Amount and/or Complexity of Data Reviewed Labs: ordered. Radiology: ordered. ECG/medicine tests: ordered.  Risk Prescription drug management.    Problem List Items Addressed This Visit       Other   Seizure [facility]) - Primary         Mendell Bontempo, Mayer Masker, MD 01/05/23 [phone]
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
## 2023-01-05 NOTE — ED Notes (Signed)
Report given to TCU.  Pt can go.

## 2023-01-05 NOTE — ED Notes (Signed)
Patient took entire bottle of Keppra 250mg , MD aware, patient to be IVC
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
## 2023-01-06 ENCOUNTER — Inpatient Hospital Stay: Admission: RE | Admit: 2023-01-06 | Payer: No Payment, Other | Source: Intra-hospital | Admitting: Psychiatry

## 2023-01-06 ENCOUNTER — Inpatient Hospital Stay
Admission: AD | Admit: 2023-01-06 | Discharge: 2023-01-12 | DRG: 885 | Disposition: A | Discharge status: Home / Self Care | Payer: 59 | Source: Intra-hospital | Attending: Psychiatry | Admitting: Psychiatry

## 2023-01-06 DIAGNOSIS — G4700 Insomnia, unspecified: Secondary | ICD-10-CM | POA: Diagnosis present

## 2023-01-06 DIAGNOSIS — F17210 Nicotine dependence, cigarettes, uncomplicated: Secondary | ICD-10-CM | POA: Diagnosis present

## 2023-01-06 DIAGNOSIS — Z5982 Transportation insecurity: Secondary | ICD-10-CM | POA: Diagnosis not present

## 2023-01-06 DIAGNOSIS — G40909 Epilepsy, unspecified, not intractable, without status epilepticus: Secondary | ICD-10-CM | POA: Diagnosis present

## 2023-01-06 DIAGNOSIS — F4323 Adjustment disorder with mixed anxiety and depressed mood: Secondary | ICD-10-CM | POA: Diagnosis present

## 2023-01-06 DIAGNOSIS — K5900 Constipation, unspecified: Secondary | ICD-10-CM | POA: Diagnosis present

## 2023-01-06 DIAGNOSIS — Z79899 Other long term (current) drug therapy: Secondary | ICD-10-CM | POA: Diagnosis not present

## 2023-01-06 DIAGNOSIS — F323 Major depressive disorder, single episode, severe with psychotic features: Secondary | ICD-10-CM

## 2023-01-06 DIAGNOSIS — Z21 Asymptomatic human immunodeficiency virus [HIV] infection status: Secondary | ICD-10-CM | POA: Diagnosis present

## 2023-01-06 DIAGNOSIS — Z5941 Food insecurity: Secondary | ICD-10-CM | POA: Diagnosis not present

## 2023-01-06 DIAGNOSIS — R45851 Suicidal ideations: Secondary | ICD-10-CM | POA: Diagnosis present

## 2023-01-06 DIAGNOSIS — F333 Major depressive disorder, recurrent, severe with psychotic symptoms: Principal | ICD-10-CM | POA: Diagnosis present

## 2023-01-06 DIAGNOSIS — K30 Functional dyspepsia: Secondary | ICD-10-CM | POA: Diagnosis present

## 2023-01-06 DIAGNOSIS — Z818 Family history of other mental and behavioral disorders: Secondary | ICD-10-CM | POA: Diagnosis not present

## 2023-01-06 MED ORDER — BICTEGRAVIR-EMTRICITAB-TENOFOV 50-200-25 MG PO TABS
1.0000 | ORAL_TABLET | Freq: Every day | ORAL | Status: DC
  Administered 2023-01-07 – 2023-01-12 (×6): 1 via ORAL
  Filled 2023-01-06 (×6): qty 1

## 2023-01-06 MED ORDER — DIPHENHYDRAMINE HCL 25 MG PO CAPS
50.0000 mg | ORAL_CAPSULE | Freq: Three times a day (TID) | ORAL | Status: DC | PRN
  Administered 2023-01-07: 50 mg via ORAL
  Filled 2023-01-06 (×2): qty 2

## 2023-01-06 MED ORDER — HALOPERIDOL 5 MG PO TABS
5.0000 mg | ORAL_TABLET | Freq: Three times a day (TID) | ORAL | Status: DC | PRN
  Administered 2023-01-07: 5 mg via ORAL
  Filled 2023-01-06: qty 1

## 2023-01-06 MED ORDER — LEVETIRACETAM 750 MG PO TABS
750.0000 mg | ORAL_TABLET | Freq: Two times a day (BID) | ORAL | Status: DC
  Administered 2023-01-07 – 2023-01-09 (×5): 750 mg via ORAL
  Filled 2023-01-06 (×6): qty 1

## 2023-01-06 MED ORDER — HYDROXYZINE HCL 25 MG PO TABS
25.0000 mg | ORAL_TABLET | Freq: Three times a day (TID) | ORAL | Status: DC | PRN
  Administered 2023-01-09 – 2023-01-11 (×5): 25 mg via ORAL
  Filled 2023-01-06 (×5): qty 1

## 2023-01-06 MED ORDER — HALOPERIDOL LACTATE 5 MG/ML IJ SOLN: 5.0000 mg | Freq: Three times a day (TID) | INTRAMUSCULAR | Status: DC | PRN

## 2023-01-06 MED ORDER — LORAZEPAM 2 MG/ML IJ SOLN: 2.0000 mg | Freq: Three times a day (TID) | INTRAMUSCULAR | Status: DC | PRN

## 2023-01-06 MED ORDER — DIPHENHYDRAMINE HCL 50 MG/ML IJ SOLN: 50.0000 mg | Freq: Three times a day (TID) | INTRAMUSCULAR | Status: DC | PRN

## 2023-01-06 MED ORDER — MAGNESIUM HYDROXIDE 400 MG/5ML PO SUSP: 30.0000 mL | Freq: Every day | ORAL | Status: DC | PRN

## 2023-01-06 MED ORDER — ESCITALOPRAM OXALATE 10 MG PO TABS
10.0000 mg | ORAL_TABLET | Freq: Every day | ORAL | Status: DC
  Administered 2023-01-06: 10 mg via ORAL
  Filled 2023-01-06: qty 1

## 2023-01-06 MED ORDER — OLANZAPINE 5 MG PO TBDP
5.0000 mg | ORAL_TABLET | Freq: Two times a day (BID) | ORAL | Status: DC
  Administered 2023-01-07 – 2023-01-12 (×11): 5 mg via ORAL
  Filled 2023-01-06 (×11): qty 1

## 2023-01-06 MED ORDER — LORAZEPAM 2 MG PO TABS
2.0000 mg | ORAL_TABLET | Freq: Three times a day (TID) | ORAL | Status: DC | PRN
  Administered 2023-01-07: 2 mg via ORAL
  Filled 2023-01-06: qty 1

## 2023-01-06 MED ORDER — ACETAMINOPHEN 325 MG PO TABS
650.0000 mg | ORAL_TABLET | Freq: Four times a day (QID) | ORAL | Status: DC | PRN
  Administered 2023-01-07 – 2023-01-09 (×2): 650 mg via ORAL
  Filled 2023-01-06 (×2): qty 2

## 2023-01-06 MED ORDER — ALUM & MAG HYDROXIDE-SIMETH 200-200-20 MG/5ML PO SUSP: 30.0000 mL | ORAL | Status: DC | PRN

## 2023-01-06 MED ORDER — LEVETIRACETAM 500 MG PO TABS
750.0000 mg | ORAL_TABLET | Freq: Two times a day (BID) | ORAL | Status: DC
  Administered 2023-01-06: 750 mg via ORAL
  Filled 2023-01-06: qty 1

## 2023-01-06 MED ORDER — BICTEGRAVIR-EMTRICITAB-TENOFOV 50-200-25 MG PO TABS
1.0000 | ORAL_TABLET | Freq: Every day | ORAL | Status: DC
  Administered 2023-01-06: 1 via ORAL
  Filled 2023-01-06: qty 1

## 2023-01-06 MED ORDER — ESCITALOPRAM OXALATE 10 MG PO TABS
10.0000 mg | ORAL_TABLET | Freq: Every day | ORAL | Status: DC
  Administered 2023-01-07 – 2023-01-12 (×6): 10 mg via ORAL
  Filled 2023-01-06 (×6): qty 1

## 2023-01-06 MED ORDER — OLANZAPINE 5 MG PO TBDP
5.0000 mg | ORAL_TABLET | Freq: Two times a day (BID) | ORAL | Status: DC
  Administered 2023-01-06: 5 mg via ORAL
  Filled 2023-01-06: qty 1

## 2023-01-06 MED ORDER — TRAZODONE HCL 50 MG PO TABS
50.0000 mg | ORAL_TABLET | Freq: Every evening | ORAL | Status: DC | PRN
  Administered 2023-01-09 – 2023-01-11 (×3): 50 mg via ORAL
  Filled 2023-01-06 (×4): qty 1

## 2023-01-06 NOTE — ED Notes (Signed)
Called report to [facility] and [employer] for transportation.

## 2023-01-06 NOTE — ED Provider Notes (Signed)
Emergency Medicine Observation Re-evaluation Note  Justin [name] is a 42 y.o. male, seen on rounds today.  Pt initially presented to the ED for complaints of Seizures Currently, the patient is resting.  Physical Exam  BP 100/65 (BP Location: Left Arm)   Pulse 91   Temp 98.3 F (36.8 C) (Oral)   Resp 18   Ht 6' (1.829 m)   Wt 91 kg   SpO2 97%   BMI 27.21 kg/m  Physical Exam General: resting comfortably, NAD Lungs: normal WOB Psych: currently calm and resting  ED Course / MDM  EKG:   I have reviewed the labs performed to date as well as medications administered while in observation.  Recent changes in the last 24 hours include none.  Plan  Current plan is for inpatient psychiatric care.    Rozelle Logan, DO 01/06/23 0800

## 2023-01-06 NOTE — ED Notes (Signed)
Pt calm and cooperative throughout shift. Pt slept most of the day. Compliant with vitals. Performs ADLs independently. Endorses SI with plan. Denies HI and AVH.

## 2023-01-06 NOTE — Progress Notes (Signed)
Pt was accepted to [facility] BMU TODAY 01/06/2023, pending IVC paperwork faxed to [phone]. Bed assignment: 319  Pt meets inpatient criteria per Alona Bene, PMHNP  Attending Physician will be Mordecai Rasmussen, MD  Report can be called to: [phone]  Pt can arrive after pending items are received  Care Team Notified: [facility], RN, Sherian Rein, RN, and [facility], PMHNP  [place], [place]  01/06/2023 1:58 PM

## 2023-01-06 NOTE — ED Notes (Signed)
Patient has been cleared by Poison Control.

## 2023-01-06 NOTE — Progress Notes (Signed)
Patient was admitted to unit and vitals were obtained and skin was assessed with [facility], RN. This Clinical research associate will inform the oncoming shift to complete admission assessment.

## 2023-01-06 NOTE — Progress Notes (Signed)
[facility] Psych ED Progress Note  01/06/2023 3:19 PM Justin [name]  MRN:  [id number]   Principal Problem: 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HCC) Diagnosis:  Principal Problem:   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HCC) Active Problems:   Suicidal ideations   Substance induced mood disorder [facility])   ED Assessment Time Calculation: Start Time: 1400 Stop Time: 1420 Total Time in Minutes (Assessment Completion): 20   Subjective: On evaluation today, the patient is laying in his bed, in no acute distress. He is calm and cooperative during this assessment. His appearance is appropriate for environment. His eye contact is good. Speech is clear and coherent, normal pace and normal volume.  She reports her mood is euthymic.  Affect is congruent with mood.  Thought process is coherent.  Thought content is slightly scattered. Pt reports hearing voices, "the voices tell me to get it done, in terms of killing himself. No indication that he is responding to internal stimuli during this assessment.  No delusions elicited during this assessment. He denies homicidal ideations. He endorses suicidal ideations.  Appetite is fair, he says he has been eating as much hospital food as he can tolerate and sleep is good, patient slept most of day in hospital. Patient refused to contract for safety. He endorses signs of depression, anhedonia, changes in his appetite, feelings of worthlessness/guilt, and suicidal nor homicidal ideation.    Support, encouragement and reassurance provided about ongoing stressors and patient provided with opportunity for questions.     Past Psychiatric History: depression and anxiety   [place] Scale:  Flowsheet Row ED from 01/05/2023 in [facility] Emergency Department at [facility] ED from 01/04/2023 in [facility] Emergency Department at [facility] ED from 09/25/2022 in [facility] Emergency Department at [facility]  C-SSRS RISK  CATEGORY High Risk No Risk No Risk       Past Medical History:  Past Medical History:  Diagnosis Date   Chronic hepatitis C without hepatic coma (HCC) 08/17/2017   Endocarditis    History of syphilis 08/25/2014   Treated by [facility] 07-2014.   HIV (human immunodeficiency virus infection) (HCC)    Seizures (HCC)     Past Surgical History:  Procedure Laterality Date   hand surgery Right    LUMBAR PUNCTURE  08/08/2019       WRIST SURGERY     Family History:  Family History  Adopted: Yes  Problem Relation Age of Onset   Diabetes type II Mother     Social History:  Social History   Substance and Sexual Activity  Alcohol Use Not Currently   Comment: in rehab     Social History   Substance and Sexual Activity  Drug Use Yes   Types: Marijuana, Methamphetamines   Comment: states last meth use was 08/2021    Social History   Socioeconomic History   Marital status: Single    Spouse name: Not on file   Number of children: Not on file   Years of education: Not on file   Highest education level: Not on file  Occupational History   Not on file  Tobacco Use   Smoking status: Every Day    Types: Cigarettes   Smokeless tobacco: Never  Vaping Use   Vaping Use: Never used  Substance and Sexual Activity   Alcohol use: Not Currently    Comment: in rehab   Drug use: Yes    Types: Marijuana, Methamphetamines    Comment:  states last meth use was 08/2021   Sexual activity: Not Currently    Partners: Male    Birth control/protection: Condom    Comment: given condoms  Other Topics Concern   Not on file  Social History Narrative   ** Merged History Encounter **       ** Merged History Encounter **       ** Merged History Encounter **       ** Merged History Encounter **       Social Determinants of Corporate investment banker Strain: Not on file  Food Insecurity: Not on file  Transportation Needs: Not on file  Physical Activity: Not on file  Stress: Not on file   Social Connections: Not on file    Sleep: Good  Appetite:  Fair  Current Medications: Current Facility-Administered Medications  Medication Dose Route Frequency Provider Last Rate Last Admin   bictegravir-emtricitabine-tenofovir AF (BIKTARVY) 50-200-25 MG per tablet 1 tablet  1 tablet Oral Daily Motley-Mangrum, Dontrell Stuck A, PMHNP       escitalopram (LEXAPRO) tablet 10 mg  10 mg Oral Daily Motley-Mangrum, Mitsuye Schrodt A, PMHNP       levETIRAcetam (KEPPRA) tablet 750 mg  750 mg Oral BID Motley-Mangrum, Romin Divita A, PMHNP       OLANZapine zydis (ZYPREXA) disintegrating tablet 5 mg  5 mg Oral BID Motley-Mangrum, [place] A, PMHNP       Current Outpatient Medications  Medication Sig Dispense Refill   BIKTARVY 50-200-25 MG TABS tablet TAKE 1 TABLET BY MOUTH DAILY 30 tablet 4   escitalopram (LEXAPRO) 10 MG tablet Take 1 tablet (10 mg total) by mouth daily. 30 tablet 2   levETIRAcetam (KEPPRA) 750 MG tablet Take 2 tablets (1,500 mg total) by mouth 2 (two) times daily. (Patient taking differently: Take 750 mg by mouth 2 (two) times daily.) 120 tablet 11   OLANZapine zydis (ZYPREXA) 5 MG disintegrating tablet Take 1 tablet (5 mg total) by mouth 2 (two) times daily. (Patient taking differently: Take 5 mg by mouth at bedtime.)      Lab Results:  Results for orders placed or performed during the hospital encounter of 01/05/23 (from the past 48 hour(s))  CBG monitoring, ED     Status: Abnormal   Collection Time: 01/05/23 11:00 AM  Result Value Ref Range   Glucose-Capillary 68 (L) 70 - 99 mg/dL    Comment: Glucose reference range applies only to samples taken after fasting for at least 8 hours.  CBC     Status: None   Collection Time: 01/05/23 11:10 AM  Result Value Ref Range   WBC 9.0 4.0 - 10.5 K/uL   RBC 4.95 4.22 - 5.81 MIL/uL   Hemoglobin 16.2 13.0 - 17.0 g/dL   HCT 16.1 09.6 - 04.5 %   MCV 98.2 80.0 - 100.0 fL   MCH 32.7 26.0 - 34.0 pg   MCHC 33.3 30.0 - 36.0 g/dL   RDW 40.9 81.1 - 91.4 %    Platelets 173 150 - 400 K/uL   nRBC 0.0 0.0 - 0.2 %    Comment: Performed at [facility], [address], [place], [place] [address]  Basic metabolic panel     Status: Abnormal   Collection Time: 01/05/23 11:10 AM  Result Value Ref Range   Sodium 132 (L) 135 - 145 mmol/L   Potassium 3.8 3.5 - 5.1 mmol/L    Comment: HEMOLYSIS AT THIS LEVEL MAY AFFECT RESULT   Chloride 101 98 -  111 mmol/L   CO2 18 (L) 22 - 32 mmol/L   Glucose, Bld 79 70 - 99 mg/dL    Comment: Glucose reference range applies only to samples taken after fasting for at least 8 hours.   BUN 11 6 - 20 mg/dL   Creatinine, Ser 1.61 0.61 - 1.24 mg/dL   Calcium 8.5 (L) 8.9 - 10.3 mg/dL   GFR, Estimated >09 >60 mL/min    Comment: (NOTE) Calculated using the CKD-EPI Creatinine Equation (2021)    Anion gap 13 5 - 15    Comment: Performed at [facility], [address], [place], [place] [address]  Ethanol     Status: None   Collection Time: 01/05/23  3:35 PM  Result Value Ref Range   Alcohol, Ethyl (B) <10 <10 mg/dL    Comment: (NOTE) Lowest detectable limit for serum alcohol is 10 mg/dL.  For medical purposes only. Performed at [facility], [address], [place], [place] [address]   Acetaminophen level     Status: Abnormal   Collection Time: 01/05/23  3:35 PM  Result Value Ref Range   Acetaminophen (Tylenol), Serum <10 (L) 10 - 30 ug/mL    Comment: (NOTE) Therapeutic concentrations vary significantly. A range of 10-30 ug/mL  may be an effective concentration for many patients. However, some  are best treated at concentrations outside of this range. Acetaminophen concentrations >150 ug/mL at 4 hours after ingestion  and >50 ug/mL at 12 hours after ingestion are often associated with  toxic reactions.  Performed at [facility], [address] [place], [place] [address]   Salicylate level     Status: Abnormal   Collection Time: 01/05/23   3:35 PM  Result Value Ref Range   Salicylate Lvl <7.0 (L) 7.0 - 30.0 mg/dL    Comment: Performed at [facility], [address] [place], [place] [address]  Urine rapid drug screen (hosp performed)     Status: Abnormal   Collection Time: 01/05/23  4:48 PM  Result Value Ref Range   Opiates NONE DETECTED NONE DETECTED   Cocaine NONE DETECTED NONE DETECTED   Benzodiazepines POSITIVE (A) NONE DETECTED   Amphetamines POSITIVE (A) NONE DETECTED   Tetrahydrocannabinol NONE DETECTED NONE DETECTED   Barbiturates NONE DETECTED NONE DETECTED    Comment: (NOTE) DRUG SCREEN FOR MEDICAL PURPOSES ONLY.  IF CONFIRMATION IS NEEDED FOR ANY PURPOSE, NOTIFY LAB WITHIN 5 DAYS.  LOWEST DETECTABLE LIMITS FOR URINE DRUG SCREEN Drug Class                     Cutoff (ng/mL) Amphetamine and metabolites    1000 Barbiturate and metabolites    200 Benzodiazepine                 200 Opiates and metabolites        300 Cocaine and metabolites        300 THC                            50 Performed at [facility], [address] [place], [place] [address]   Acetaminophen level     Status: Abnormal   Collection Time: 01/05/23  7:41 PM  Result Value Ref Range   Acetaminophen (Tylenol), Serum <10 (L) 10 - 30 ug/mL    Comment: (NOTE) Therapeutic concentrations vary significantly. A range of 10-30 ug/mL  may  be an effective concentration for many patients. However, some  are best treated at concentrations outside of this range. Acetaminophen concentrations >150 ug/mL at 4 hours after ingestion  and >50 ug/mL at 12 hours after ingestion are often associated with  toxic reactions.  Performed at [facility], [address], [place], [place] [address]     Blood Alcohol level:  Lab Results  Component Value Date   [facility] <10 01/05/2023   ETH <10 09/25/2022    Physical Findings:  CIWA:    COWS:     Musculoskeletal: Strength & Muscle Tone: within  normal limits Gait & Station: normal Patient leans: N/A  Psychiatric Specialty Exam:  Presentation  General Appearance:  Appropriate for Environment  Eye Contact: Fleeting  Speech: Clear and Coherent  Speech Volume: Normal  Handedness: Right   Mood and Affect  Mood: Depressed; Hopeless  Affect: Appropriate   Thought Process  Thought Processes: Coherent  Descriptions of Associations:Intact  Orientation:Full (Time, Place and Person)  Thought Content:Scattered  History of Schizophrenia/Schizoaffective disorder:No  Duration of Psychotic Symptoms:N/A  Hallucinations:Hallucinations: None  Ideas of Reference:None  Suicidal Thoughts:Suicidal Thoughts: Yes, Active  Homicidal Thoughts:Homicidal Thoughts: No   Sensorium  Memory: Immediate Good; Recent Good; Remote Poor  Judgment: Poor  Insight: Lacking   Executive Functions  Concentration: Fair  Attention Span: Fair  Recall: [employer] of Knowledge: Fair  Language: Fair   Psychomotor Activity  Psychomotor Activity: Psychomotor Activity: Normal   Assets  Assets: Communication Skills; Desire for Improvement; Social Support   Sleep  Sleep: Sleep: Fair    Physical Exam: Physical Exam Vitals and nursing note reviewed. Exam conducted with a chaperone present.  Neurological:     Mental Status: He is alert.  Psychiatric:        Attention and Perception: Attention normal.        Mood and Affect: Mood is depressed. Affect is flat.        Speech: Speech normal.        Behavior: Behavior is cooperative.        Thought Content: Thought content includes suicidal ideation. Thought content includes suicidal plan.        Cognition and Memory: Memory normal.        Judgment: Judgment is impulsive and inappropriate.    Review of Systems  Constitutional: Negative.   Psychiatric/Behavioral:  Positive for depression, substance abuse and suicidal ideas.    Blood pressure 93/81, pulse  99, temperature (!) 97.5 F (36.4 C), temperature source Oral, resp. rate 18, height 6' (1.829 m), weight 91 kg, SpO2 99 %. Body mass index is 27.21 kg/m.    Medical Decision Making: Patient continues to require inpatient Psychiatric hospitalization.      Valary Manahan MOTLEY-MANGRUM, PMHNP 01/06/2023, 3:19 PM

## 2023-01-07 ENCOUNTER — Encounter: Payer: Self-pay | Admitting: Psychiatry

## 2023-01-07 DIAGNOSIS — F333 Major depressive disorder, recurrent, severe with psychotic symptoms: Principal | ICD-10-CM

## 2023-01-07 NOTE — Group Note (Signed)
Date:  01/07/2023 Time:  3:40 PM  Group Topic/Focus:  Activity Group:  The focus of this group is to encourage patients to come outside to the courtyard to promote exercise and fresh air to promote wellbeing for both physical and mental aspects.    Participation Level:  Did Not Attend   Justin [name] 01/07/2023, 3:40 PM

## 2023-01-07 NOTE — Group Note (Signed)
Date:  01/07/2023 Time:  10:46 AM  Group Topic/Focus:  Goals Group:   The focus of this group is to help patients establish daily goals to achieve during treatment and discuss how the patient can incorporate goal setting into their daily lives to aide in recovery.  Community Group   Participation Level:  Did Not Attend  Justin [name] A [name] 01/07/2023, 10:46 AM

## 2023-01-07 NOTE — Progress Notes (Signed)
I assumed care for [name] at abt 07:30. He was resting in his bed, later seen in the day area after his am med pass. Pt denied any avh/hi/si at the time of my assessment, no changes in condition from baseline, vital signs wnl, will assess wound later. He is being monitored as ordered
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
## 2023-01-07 NOTE — Progress Notes (Signed)
Patient alert and oriented x 4, affect is blunted he denies SI/HI/AVH will continue to monitor.

## 2023-01-08 DIAGNOSIS — F333 Major depressive disorder, recurrent, severe with psychotic symptoms: Principal | ICD-10-CM

## 2023-01-08 MED ORDER — BACITRACIN ZINC 500 UNIT/GM EX OINT
TOPICAL_OINTMENT | Freq: Every day | CUTANEOUS | Status: DC
  Administered 2023-01-09 – 2023-01-11 (×3): 1 via TOPICAL
  Filled 2023-01-08 (×4): qty 0.9
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
## 2023-01-08 NOTE — Plan of Care (Signed)
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
## 2023-01-08 NOTE — Group Note (Signed)
LCSW Group Therapy Note  Group Date: 01/08/2023 Start Time: 1310 End Time: 1350   Type of Therapy and Topic:  Group Therapy - Healthy vs Unhealthy Coping Skills  Participation Level:  Did Not Attend   Description of Group The focus of this group was to determine what unhealthy coping techniques typically are used by group members and what healthy coping techniques would be helpful in coping with various problems. Patients were guided in becoming aware of the differences between healthy and unhealthy coping techniques. Patients were asked to identify 2-3 healthy coping skills they would like to learn to use more effectively.   Summary of Patient Progress:  Patient did not attend group.  Marshell Levan, LCSWA 01/08/2023  2:12 PM
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
## 2023-01-08 NOTE — Progress Notes (Signed)
Patient is alert and oriented x 4, affect is flat but brightens upon approach, his thoughts are organized and coherent interacting appropriately with peers and staff. Patient denies SI/HI/AVH he denies pain or discomfort. 15 minutes safety checks maintained will continue to monitor.
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
## 2023-01-08 NOTE — Progress Notes (Signed)
(  4) Bilateral outer buttock and medial wounds washed with soap and water, cleansed with NS. Bacitracin applied to opened wounds, covered by 2" x 2" then Tegaderm. Wounds approximately 1 cm round  with small amount serosanguinous drainage and beefy pink tissue. No signs or symptoms of infection or complication.

## 2023-01-09 DIAGNOSIS — F333 Major depressive disorder, recurrent, severe with psychotic symptoms: Secondary | ICD-10-CM | POA: Diagnosis not present

## 2023-01-09 LAB — LEVETIRACETAM LEVEL: Levetiracetam Lvl: 67.4 ug/mL — ABNORMAL HIGH (ref 10.0–40.0)

## 2023-01-09 MED ORDER — LEVETIRACETAM 750 MG PO TABS
1500.0000 mg | ORAL_TABLET | Freq: Two times a day (BID) | ORAL | Status: DC
  Administered 2023-01-09 – 2023-01-12 (×6): 1500 mg via ORAL
  Filled 2023-01-09 (×7): qty 2

## 2023-01-09 NOTE — Group Note (Signed)
LCSW Group Therapy Note  Group Date: 01/09/2023 Start Time: 1315 End Time: 1400   Type of Therapy and Topic:  Group Therapy: Anger Cues and Responses  Participation Level:  Did Not Attend   Description of Group:   In this group, patients learned how to recognize the physical, cognitive, emotional, and behavioral responses they have to anger-provoking situations.  They identified a recent time they became angry and how they reacted.  They analyzed how their reaction was possibly beneficial and how it was possibly unhelpful.  The group discussed a variety of healthier coping skills that could help with such a situation in the future.  Focus was placed on how helpful it is to recognize the underlying emotions to our anger, because working on those can lead to a more permanent solution as well as our ability to focus on the important rather than the urgent.  Therapeutic Goals: Patients will remember their last incident of anger and how they felt emotionally and physically, what their thoughts were at the time, and how they behaved. Patients will identify how their behavior at that time worked for them, as well as how it worked against them. Patients will explore possible new behaviors to use in future anger situations. Patients will learn that anger itself is normal and cannot be eliminated, and that healthier reactions can assist with resolving conflict rather than worsening situations.  Summary of Patient Progress:   Patient declined to attend group despite encouragement from this clinician.   Therapeutic Modalities:   Cognitive Behavioral Therapy    Justin [name], LCSWA 01/09/2023  2:15 PM    
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
## 2023-01-09 NOTE — Group Note (Signed)
Date:  01/09/2023 Time:  1:38 AM  Group Topic/Focus:  Setback in Recovery    Participation Level:  Active  Participation Quality:  Appropriate  Affect:  Appropriate  Cognitive:  Appropriate  Insight: Good  Engagement in Group:  Engaged  Modes of Intervention:  Education  Additional Comments:    Lenore Cordia 01/09/2023, 1:38 AM

## 2023-01-09 NOTE — Group Note (Signed)
Date:  01/09/2023 Time:  10:01 AM  Group Topic/Focus:  Goals Group:   The focus of this group is to help patients establish daily goals to achieve during treatment and discuss how the patient can incorporate goal setting into their daily lives to aide in recovery.    Participation Level:  Did Not Attend   Justin [name] Justin [name] 01/09/2023, 10:01 AM  

## 2023-01-09 NOTE — Plan of Care (Signed)
Patient stated that he could sleep some but his depression and anxiety the same because of the " uncertainty ". Patient stated that his plain is move out of [place] when he gets out from here.Patient appropriate with staff & peers. Patient denies SI,HI and AVH. Dressing changed on buttocks after patient had a shower. No drainage or signs of infection noted. Appetite and energy level good. Support and encouragement given.

## 2023-01-09 NOTE — BH IP Treatment Plan (Signed)
Interdisciplinary Treatment and Diagnostic Plan Update  01/09/2023 Time of Session: 11:08 Justin [name] MRN: [id number]  Principal Diagnosis: MDD (major depressive disorder), recurrent, severe, with psychosis (HCC)  Secondary Diagnoses: Principal Problem:   MDD (major depressive disorder), recurrent, severe, with psychosis (HCC)   Current Medications:  Current Facility-Administered Medications  Medication Dose Route Frequency Provider Last Rate Last Admin   acetaminophen (TYLENOL) tablet 650 mg  650 mg Oral Q6H PRN Motley-Mangrum, Jadeka A, PMHNP   650 mg at 01/07/23 0757   alum & mag hydroxide-simeth (MAALOX/MYLANTA) 200-200-20 MG/5ML suspension 30 mL  30 mL Oral Q4H PRN Motley-Mangrum, Geralynn Ochs A, PMHNP       bacitracin ointment   Topical Daily Clapacs, Jackquline Denmark, MD   1 Application at 01/09/23 0803   bictegravir-emtricitabine-tenofovir AF (BIKTARVY) 50-200-25 MG per tablet 1 tablet  1 tablet Oral Daily Motley-Mangrum, Jadeka A, PMHNP   1 tablet at 01/09/23 0808   diphenhydrAMINE (BENADRYL) capsule 50 mg  50 mg Oral TID PRN Motley-Mangrum, Jadeka A, PMHNP   50 mg at 01/07/23 1822   Or   diphenhydrAMINE (BENADRYL) injection 50 mg  50 mg Intramuscular TID PRN Motley-Mangrum, Jadeka A, PMHNP       escitalopram (LEXAPRO) tablet 10 mg  10 mg Oral Daily Motley-Mangrum, Jadeka A, PMHNP   10 mg at 01/09/23 5621   haloperidol (HALDOL) tablet 5 mg  5 mg Oral TID PRN Motley-Mangrum, Ezra Sites, PMHNP   5 mg at 01/07/23 3086   Or   haloperidol lactate (HALDOL) injection 5 mg  5 mg Intramuscular TID PRN Motley-Mangrum, Geralynn Ochs A, PMHNP       hydrOXYzine (ATARAX) tablet 25 mg  25 mg Oral TID PRN Motley-Mangrum, Jadeka A, PMHNP   25 mg at 01/09/23 0803   levETIRAcetam (KEPPRA) tablet 1,500 mg  1,500 mg Oral BID Sarina Ill, DO       LORazepam (ATIVAN) tablet 2 mg  2 mg Oral TID PRN Motley-Mangrum, Geralynn Ochs A, PMHNP   2 mg at 01/07/23 1821   Or   LORazepam (ATIVAN) injection 2 mg  2 mg  Intramuscular TID PRN Motley-Mangrum, Jadeka A, PMHNP       magnesium hydroxide (MILK OF MAGNESIA) suspension 30 mL  30 mL Oral Daily PRN Motley-Mangrum, Jadeka A, PMHNP       OLANZapine zydis (ZYPREXA) disintegrating tablet 5 mg  5 mg Oral BID Motley-Mangrum, Jadeka A, PMHNP   5 mg at 01/09/23 0803   traZODone (DESYREL) tablet 50 mg  50 mg Oral QHS PRN Motley-Mangrum, Jadeka A, PMHNP       PTA Medications: Medications Prior to Admission  Medication Sig Dispense Refill Last Dose   BIKTARVY 50-200-25 MG TABS tablet TAKE 1 TABLET BY MOUTH DAILY 30 tablet 4    escitalopram (LEXAPRO) 10 MG tablet Take 1 tablet (10 mg total) by mouth daily. 30 tablet 2    levETIRAcetam (KEPPRA) 750 MG tablet Take 2 tablets (1,500 mg total) by mouth 2 (two) times daily. (Patient taking differently: Take 750 mg by mouth 2 (two) times daily.) 120 tablet 11    OLANZapine zydis (ZYPREXA) 5 MG disintegrating tablet Take 1 tablet (5 mg total) by mouth 2 (two) times daily. (Patient taking differently: Take 5 mg by mouth at bedtime.)       Patient Stressors:    Patient Strengths:    Treatment Modalities: Medication Management, Group therapy, Case management,  1 to 1 session with clinician, Psychoeducation, Recreational therapy.   Physician Treatment Plan for  Primary Diagnosis: MDD (major depressive disorder), recurrent, severe, with psychosis (HCC) Long Term Goal(s): Improvement in symptoms so as ready for discharge   Short Term Goals: Ability to identify changes in lifestyle to reduce recurrence of condition will improve Ability to verbalize feelings will improve Ability to disclose and discuss suicidal ideas Ability to demonstrate self-control will improve Ability to identify and develop effective coping behaviors will improve Ability to maintain clinical measurements within normal limits will improve Compliance with prescribed medications will improve Ability to identify triggers associated with substance  abuse/mental health issues will improve  Medication Management: Evaluate patient's response, side effects, and tolerance of medication regimen.  Therapeutic Interventions: 1 to 1 sessions, Unit Group sessions and Medication administration.  Evaluation of Outcomes: Not Met  Physician Treatment Plan for Secondary Diagnosis: Principal Problem:   MDD (major depressive disorder), recurrent, severe, with psychosis (HCC)  Long Term Goal(s): Improvement in symptoms so as ready for discharge   Short Term Goals: Ability to identify changes in lifestyle to reduce recurrence of condition will improve Ability to verbalize feelings will improve Ability to disclose and discuss suicidal ideas Ability to demonstrate self-control will improve Ability to identify and develop effective coping behaviors will improve Ability to maintain clinical measurements within normal limits will improve Compliance with prescribed medications will improve Ability to identify triggers associated with substance abuse/mental health issues will improve     Medication Management: Evaluate patient's response, side effects, and tolerance of medication regimen.  Therapeutic Interventions: 1 to 1 sessions, Unit Group sessions and Medication administration.  Evaluation of Outcomes: Not Met   RN Treatment Plan for Primary Diagnosis: MDD (major depressive disorder), recurrent, severe, with psychosis (HCC) Long Term Goal(s): Knowledge of disease and therapeutic regimen to maintain health will improve  Short Term Goals: Ability to remain free from injury will improve, Ability to verbalize frustration and anger appropriately will improve, Ability to demonstrate self-control, Ability to participate in decision making will improve, Ability to verbalize feelings will improve, Ability to disclose and discuss suicidal ideas, Ability to identify and develop effective coping behaviors will improve, and Compliance with prescribed medications  will improve  Medication Management: RN will administer medications as ordered by provider, will assess and evaluate patient's response and provide education to patient for prescribed medication. RN will report any adverse and/or side effects to prescribing provider.  Therapeutic Interventions: 1 on 1 counseling sessions, Psychoeducation, Medication administration, Evaluate responses to treatment, Monitor vital signs and CBGs as ordered, Perform/monitor CIWA, COWS, AIMS and Fall Risk screenings as ordered, Perform wound care treatments as ordered.  Evaluation of Outcomes: Not Met   LCSW Treatment Plan for Primary Diagnosis: MDD (major depressive disorder), recurrent, severe, with psychosis (HCC) Long Term Goal(s): Safe transition to appropriate next level of care at discharge, Engage patient in therapeutic group addressing interpersonal concerns.  Short Term Goals: Engage patient in aftercare planning with referrals and resources, Increase social support, Increase ability to appropriately verbalize feelings, Increase emotional regulation, Facilitate acceptance of mental health diagnosis and concerns, Facilitate patient progression through stages of change regarding substance use diagnoses and concerns, Identify triggers associated with mental health/substance abuse issues, and Increase skills for wellness and recovery  Therapeutic Interventions: Assess for all discharge needs, 1 to 1 time with Social worker, Explore available resources and support systems, Assess for adequacy in community support network, Educate family and significant other(s) on suicide prevention, Complete Psychosocial Assessment, Interpersonal group therapy.  Evaluation of Outcomes: Not Met   Progress in Treatment: Attending  groups: No. Participating in groups: No. Taking medication as prescribed: Yes. Toleration medication: Yes. Family/Significant other contact made: No, will contact:  if given permission. Patient  understands diagnosis: Yes. Discussing patient identified problems/goals with staff: No. Medical problems stabilized or resolved: Yes. Denies suicidal/homicidal ideation: Yes. Issues/concerns per patient self-inventory: No. Other: none.  New problem(s) identified: No, Describe:  none identified.  New Short Term/Long Term Goal(s): detox, elimination of symptoms of psychosis, medication management for mood stabilization; elimination of SI thoughts; development of comprehensive mental wellness/sobriety plan.  Patient Goals: When asked if he had any goals for this hospitalization, pt stated, "No."    Discharge Plan or Barriers: CSW will assist pt with development of an appropriate aftercare plan.   Reason for Continuation of Hospitalization: Hallucinations Medication stabilization Suicidal ideation  Estimated Length of Stay: 1-7 days  Last 3 [place] Suicide Severity Risk Score: Flowsheet Row Admission (Current) from 01/06/2023 in [facility] ED from 01/05/2023 in [facility] ED from 01/04/2023 in [facility] Department at [facility]  C-SSRS RISK CATEGORY High Risk High Risk No Risk       Last [facility] 2/9 Scores:    08/17/2022    3:38 PM 06/30/2021    4:34 PM 04/05/2021    2:50 AM  Depression screen PHQ 2/9  Decreased Interest 0 1 0  Down, Depressed, Hopeless 1 1 1   PHQ - 2 Score 1 2 1   Altered sleeping   0  Tired, decreased energy   0  Change in appetite   0  Feeling bad or failure about yourself    1  Trouble concentrating   1  Moving slowly or fidgety/restless   0  Suicidal thoughts   0  PHQ-9 Score   3  Difficult doing work/chores   Somewhat difficult    Scribe for Treatment Team: Glenis Smoker, LCSW 01/09/2023 1:58 PM

## 2023-01-09 NOTE — Group Note (Signed)
Recreation Therapy Group Note   Group Topic:Coping Skills  Group Date: 01/09/2023 Start Time: 1000 End Time: 1050 Facilitators: Rosina Lowenstein, LRT, CTRS Location:  Craft Room  Group Description: Mind Map.  Patient was provided a blank template of a diagram with 32 blank boxes in a tiered system, branching from the center (similar to a bubble chart). LRT directed patients to label the middle of the diagram "Coping Skills". LRT and patients then came up with 8 different coping skills as examples. Pt were directed to record their coping skills in the 2nd tier boxes closest to the center.  Patients would then share their coping skills with the group as LRT wrote them out. LRT gave a handout of 100 different coping skills at the end of group.   Goal Area(s) Addressed: Patients will be able to define "coping skills". Patient will identify new coping skills.  Patient will increase communication.   Affect/Mood: N/A   Participation Level: Did not attend    Clinical Observations/Individualized Feedback: Phill did not attend group.  Plan: Continue to engage patient in RT group sessions 2-3x/week.   Rosina Lowenstein, LRT, CTRS 01/09/2023 11:40 AM

## 2023-01-09 NOTE — Group Note (Signed)
Date:  01/09/2023 Time:  9:51 PM  Group Topic/Focus:  Wrap-Up Group:   The focus of this group is to help patients review their daily goal of treatment and discuss progress on daily workbooks.    Participation Level:  Active  Participation Quality:  Appropriate  Affect:  Appropriate  Cognitive:  Alert  Insight: Appropriate  Engagement in Group:  Limited  Modes of Intervention:  Discussion  Additional Comments:     Maglione,Jaykub Mackins E 01/09/2023, 9:51 PM

## 2023-01-09 NOTE — Progress Notes (Signed)
[facility] MD Progress Note  01/09/2023 12:32 PM Justin [name]  MRN:  [id number] Subjective: Patient has a history of seizure disorder and substance abuse and apparently had made some suicidal statements that he says he does not remember.  He is more irritable today.  He has been compliant with medications.  Nurses report no other issues. Principal Problem: MDD (major depressive disorder), recurrent, severe, with psychosis (HCC) Diagnosis: Principal Problem:   MDD (major depressive disorder), recurrent, severe, with psychosis (HCC)  Total Time spent with patient: 15 minutes  Past Psychiatric History: Unremarkable  Past Medical History:  Past Medical History:  Diagnosis Date   Chronic hepatitis C without hepatic coma (HCC) 08/17/2017   Endocarditis    History of syphilis 08/25/2014   Treated by [facility] 07-2014.   HIV (human immunodeficiency virus infection) (HCC)    Seizures (HCC)     Past Surgical History:  Procedure Laterality Date   hand surgery Right    LUMBAR PUNCTURE  08/08/2019       WRIST SURGERY     Family History:  Family History  Adopted: Yes  Problem Relation Age of Onset   Diabetes type II Mother    Family Psychiatric  History: Unremarkable Social History:  Social History   Substance and Sexual Activity  Alcohol Use Not Currently   Comment: in rehab     Social History   Substance and Sexual Activity  Drug Use Yes   Types: Marijuana, Methamphetamines   Comment: states last meth use was 08/2021    Social History   Socioeconomic History   Marital status: Single    Spouse name: Not on file   Number of children: Not on file   Years of education: Not on file   Highest education level: Not on file  Occupational History   Not on file  Tobacco Use   Smoking status: Every Day    Types: Cigarettes   Smokeless tobacco: Never  Vaping Use   Vaping Use: Never used  Substance and Sexual Activity   Alcohol use: Not Currently    Comment: in rehab   Drug use: Yes     Types: Marijuana, Methamphetamines    Comment: states last meth use was 08/2021   Sexual activity: Not Currently    Partners: Male    Birth control/protection: Condom    Comment: given condoms  Other Topics Concern   Not on file  Social History Narrative   ** Merged History Encounter **       ** Merged History Encounter **       ** Merged History Encounter **       ** Merged History Encounter **       Social Determinants of Health   Financial Resource Strain: Not on file  Food Insecurity: Food Insecurity Present (01/07/2023)   Hunger Vital Sign    Worried About Running Out of Food in the Last Year: Sometimes true    Ran Out of Food in the Last Year: Sometimes true  Transportation Needs: Unmet Transportation Needs (01/07/2023)   PRAPARE - Administrator, Civil Service (Medical): Yes    Lack of Transportation (Non-Medical): Yes  Physical Activity: Not on file  Stress: Not on file  Social Connections: Not on file   Additional Social History:                         Sleep: Good  Appetite:  Good  Current Medications: Current  Facility-Administered Medications  Medication Dose Route Frequency Provider Last Rate Last Admin   acetaminophen (TYLENOL) tablet 650 mg  650 mg Oral Q6H PRN Motley-Mangrum, Jadeka A, PMHNP   650 mg at 01/07/23 0757   alum & mag hydroxide-simeth (MAALOX/MYLANTA) 200-200-20 MG/5ML suspension 30 mL  30 mL Oral Q4H PRN Motley-Mangrum, Geralynn Ochs A, PMHNP       bacitracin ointment   Topical Daily Clapacs, Jackquline Denmark, MD   1 Application at 01/09/23 0803   bictegravir-emtricitabine-tenofovir AF (BIKTARVY) 50-200-25 MG per tablet 1 tablet  1 tablet Oral Daily Motley-Mangrum, Jadeka A, PMHNP   1 tablet at 01/09/23 0808   diphenhydrAMINE (BENADRYL) capsule 50 mg  50 mg Oral TID PRN Motley-Mangrum, Jadeka A, PMHNP   50 mg at 01/07/23 1822   Or   diphenhydrAMINE (BENADRYL) injection 50 mg  50 mg Intramuscular TID PRN Motley-Mangrum, Jadeka A, PMHNP        escitalopram (LEXAPRO) tablet 10 mg  10 mg Oral Daily Motley-Mangrum, Jadeka A, PMHNP   10 mg at 01/09/23 1610   haloperidol (HALDOL) tablet 5 mg  5 mg Oral TID PRN Motley-Mangrum, Ezra Sites, PMHNP   5 mg at 01/07/23 9604   Or   haloperidol lactate (HALDOL) injection 5 mg  5 mg Intramuscular TID PRN Motley-Mangrum, Geralynn Ochs A, PMHNP       hydrOXYzine (ATARAX) tablet 25 mg  25 mg Oral TID PRN Motley-Mangrum, Jadeka A, PMHNP   25 mg at 01/09/23 0803   levETIRAcetam (KEPPRA) tablet 1,500 mg  1,500 mg Oral BID Sarina Ill, DO       LORazepam (ATIVAN) tablet 2 mg  2 mg Oral TID PRN Motley-Mangrum, Geralynn Ochs A, PMHNP   2 mg at 01/07/23 1821   Or   LORazepam (ATIVAN) injection 2 mg  2 mg Intramuscular TID PRN Motley-Mangrum, Jadeka A, PMHNP       magnesium hydroxide (MILK OF MAGNESIA) suspension 30 mL  30 mL Oral Daily PRN Motley-Mangrum, Jadeka A, PMHNP       OLANZapine zydis (ZYPREXA) disintegrating tablet 5 mg  5 mg Oral BID Motley-Mangrum, Jadeka A, PMHNP   5 mg at 01/09/23 0803   traZODone (DESYREL) tablet 50 mg  50 mg Oral QHS PRN Motley-Mangrum, Jadeka A, PMHNP        Lab Results: No results found for this or any previous visit (from the past 48 hour(s)).  Blood Alcohol level:  Lab Results  Component Value Date   ETH <10 01/05/2023   ETH <10 09/25/2022    Metabolic Disorder Labs: Lab Results  Component Value Date   HGBA1C 4.7 (L) 04/18/2020   MPG 88 04/18/2020   No results found for: "PROLACTIN" Lab Results  Component Value Date   CHOL 181 08/03/2022   TRIG 52 08/03/2022   HDL 71 08/03/2022   CHOLHDL 2.5 08/03/2022   VLDL 8 04/18/2020   LDLCALC 97 08/03/2022   LDLCALC 82 08/06/2021    Physical Findings: AIMS:  , ,  ,  ,    CIWA:    COWS:     Musculoskeletal: Strength & Muscle Tone: within normal limits Gait & Station: normal Patient leans: N/A  Psychiatric Specialty Exam:  Presentation  General Appearance:  Casual  Eye Contact: Fair  Speech: Clear  and Coherent  Speech Volume: Normal  Handedness: Right   Mood and Affect  Mood: Dysphoric  Affect: Congruent; Blunt   Thought Process  Thought Processes: Coherent  Descriptions of Associations:Intact  Orientation:Full (Time, Place and Person)  Thought Content:Perseveration; Logical  History of Schizophrenia/Schizoaffective disorder:No  Duration of Psychotic Symptoms:N/A  Hallucinations:No data recorded Ideas of Reference:Paranoia  Suicidal Thoughts:No data recorded Homicidal Thoughts:No data recorded  Sensorium  Memory: Immediate Fair; Recent Fair; Remote Poor  Judgment: Poor  Insight: Shallow   Executive Functions  Concentration: Fair  Attention Span: Fair  Recall: Fair  Fund of Knowledge: Fair  Language: Fair   Psychomotor Activity  Psychomotor Activity:No data recorded  Assets  Assets: Physical Health; Communication Skills   Sleep  Sleep:No data recorded    Blood pressure 103/66, pulse 98, temperature 97.7 F (36.5 C), temperature source Oral, resp. rate 16, height 6' (1.829 m), weight 97.5 kg, SpO2 96 %. Body mass index is 29.16 kg/m.   Treatment Plan Summary: Daily contact with patient to assess and evaluate symptoms and progress in treatment, Medication management, and Plan increase Keppra.  Sarina Ill, DO 01/09/2023, 12:32 PM

## 2023-01-09 NOTE — Progress Notes (Signed)
Patient alert and oriented x 4, affect is flat but brightens upon approach, patient isolated most of the shift to his room, he denies SI/HI/AVH, no acute distress noted, emotional support and encouragement offered, 15 minutes safety checks maintained will continue to monitor.

## 2023-01-09 NOTE — Group Note (Signed)
Date:  01/09/2023 Time:  3:33 PM  Group Topic/Focus:  Activity?Outdoor Recreation    Participation Level:  Did Not Attend   Lynelle Smoke [facility] 01/09/2023, 3:33 PM

## 2023-01-10 NOTE — Group Note (Signed)
Recreation Therapy Group Note   Group Topic:Goal Setting  Group Date: 01/10/2023 Start Time: 1005 End Time: 1105 Facilitators: Rosina Lowenstein, LRT, CTRS Location:  Craft Room  Group Description: Scientist, research (physical sciences). Patients were given many different magazines, a glue stick, markers, and a piece of cardstock paper. LRT and pts discussed the importance of having goals in life. LRT and pts discussed the difference between short-term and long-term goals, as well as what a SMART goal is. LRT encouraged pts to create a vision board, with images they picked and then cut out with safety scissors from the magazine, for themselves, that capture their short and long-term goals. LRT encouraged pts to show and explain their vision board to the group. LRT offered to laminate vision board once dry and complete.   Goal Area(s) Addressed:  Patient will gain knowledge of short vs. long term goals.  Patient will identify goals for themselves. Patient will practice setting SMART goals. Patient will verbalize their goals to LRT and peers.   Affect/Mood: N/A   Participation Level: Did not attend    Clinical Observations/Individualized Feedback: Justin [name] did not attend group.  Plan: Continue to engage patient in RT group sessions 2-3x/week.   Rosina Lowenstein, LRT, CTRS 01/10/2023 11:45 AM

## 2023-01-10 NOTE — Progress Notes (Signed)
[facility] MD Progress Note  01/10/2023 10:28 PM Justin [name]  MRN:  [id number] Subjective:  Patient seen during rounds, chart reviewed, discussed with staff.  "Patient is doing fine on the unit.  Patient was pleasant to talk.  He denies thoughts of self-harm.  Denies any psychotic symptoms.  Patient reported he may be able to stay with a friend upon discharge.  He was encouraged to call his friend to verify this.  Patient was also encouraged to attend groups and work on a safe discharge plan.  He denies thoughts of harming himself or others.   Principal Problem: MDD (major depressive disorder), recurrent, severe, with psychosis (HCC) Diagnosis: Principal Problem:   MDD (major depressive disorder), recurrent, severe, with psychosis (HCC)  Total Time spent with patient: 15 minutes  Past Psychiatric History: Unremarkable  Past Medical History:  Past Medical History:  Diagnosis Date   Chronic hepatitis C without hepatic coma (HCC) 08/17/2017   Endocarditis    History of syphilis 08/25/2014   Treated by [facility] 07-2014.   HIV (human immunodeficiency virus infection) (HCC)    Seizures (HCC)     Past Surgical History:  Procedure Laterality Date   hand surgery Right    LUMBAR PUNCTURE  08/08/2019       WRIST SURGERY     Family History:  Family History  Adopted: Yes  Problem Relation Age of Onset   Diabetes type II Mother    Family Psychiatric  History: Unremarkable Social History:  Social History   Substance and Sexual Activity  Alcohol Use Not Currently   Comment: in rehab     Social History   Substance and Sexual Activity  Drug Use Yes   Types: Marijuana, Methamphetamines   Comment: states last meth use was 08/2021    Social History   Socioeconomic History   Marital status: Single    Spouse name: Not on file   Number of children: Not on file   Years of education: Not on file   Highest education level: Not on file  Occupational History   Not on file  Tobacco Use    Smoking status: Every Day    Types: Cigarettes   Smokeless tobacco: Never  Vaping Use   Vaping Use: Never used  Substance and Sexual Activity   Alcohol use: Not Currently    Comment: in rehab   Drug use: Yes    Types: Marijuana, Methamphetamines    Comment: states last meth use was 08/2021   Sexual activity: Not Currently    Partners: Male    Birth control/protection: Condom    Comment: given condoms  Other Topics Concern   Not on file  Social History Narrative   ** Merged History Encounter **       ** Merged History Encounter **       ** Merged History Encounter **       ** Merged History Encounter **       Social Determinants of Health   Financial Resource Strain: Not on file  Food Insecurity: Food Insecurity Present (01/07/2023)   Hunger Vital Sign    Worried About Running Out of Food in the Last Year: Sometimes true    Ran Out of Food in the Last Year: Sometimes true  Transportation Needs: Unmet Transportation Needs (01/07/2023)   PRAPARE - Administrator, Civil Service (Medical): Yes    Lack of Transportation (Non-Medical): Yes  Physical Activity: Not on file  Stress: Not on file  Social  Connections: Not on file   Additional Social History:                         Sleep: Good  Appetite:  Good  Current Medications: Current Facility-Administered Medications  Medication Dose Route Frequency Provider Last Rate Last Admin   acetaminophen (TYLENOL) tablet 650 mg  650 mg Oral Q6H PRN Motley-Mangrum, Jadeka A, PMHNP   650 mg at 01/09/23 2123   alum & mag hydroxide-simeth (MAALOX/MYLANTA) 200-200-20 MG/5ML suspension 30 mL  30 mL Oral Q4H PRN Motley-Mangrum, Geralynn Ochs A, PMHNP       bacitracin ointment   Topical Daily Clapacs, Jackquline Denmark, MD   1 Application at 01/10/23 0854   bictegravir-emtricitabine-tenofovir AF (BIKTARVY) 50-200-25 MG per tablet 1 tablet  1 tablet Oral Daily Motley-Mangrum, Jadeka A, PMHNP   1 tablet at 01/10/23 0856   diphenhydrAMINE  (BENADRYL) capsule 50 mg  50 mg Oral TID PRN Motley-Mangrum, Jadeka A, PMHNP   50 mg at 01/07/23 1822   Or   diphenhydrAMINE (BENADRYL) injection 50 mg  50 mg Intramuscular TID PRN Motley-Mangrum, Jadeka A, PMHNP       escitalopram (LEXAPRO) tablet 10 mg  10 mg Oral Daily Motley-Mangrum, Jadeka A, PMHNP   10 mg at 01/10/23 0856   haloperidol (HALDOL) tablet 5 mg  5 mg Oral TID PRN Motley-Mangrum, Ezra Sites, PMHNP   5 mg at 01/07/23 1610   Or   haloperidol lactate (HALDOL) injection 5 mg  5 mg Intramuscular TID PRN Motley-Mangrum, Geralynn Ochs A, PMHNP       hydrOXYzine (ATARAX) tablet 25 mg  25 mg Oral TID PRN Motley-Mangrum, Jadeka A, PMHNP   25 mg at 01/10/23 2104   levETIRAcetam (KEPPRA) tablet 1,500 mg  1,500 mg Oral BID Sarina Ill, DO   1,500 mg at 01/10/23 1650   LORazepam (ATIVAN) tablet 2 mg  2 mg Oral TID PRN Motley-Mangrum, Ezra Sites, PMHNP   2 mg at 01/07/23 1821   Or   LORazepam (ATIVAN) injection 2 mg  2 mg Intramuscular TID PRN Motley-Mangrum, Jadeka A, PMHNP       magnesium hydroxide (MILK OF MAGNESIA) suspension 30 mL  30 mL Oral Daily PRN Motley-Mangrum, Jadeka A, PMHNP       OLANZapine zydis (ZYPREXA) disintegrating tablet 5 mg  5 mg Oral BID Motley-Mangrum, Jadeka A, PMHNP   5 mg at 01/10/23 1650   traZODone (DESYREL) tablet 50 mg  50 mg Oral QHS PRN Motley-Mangrum, Jadeka A, PMHNP   50 mg at 01/10/23 2104    Lab Results: No results found for this or any previous visit (from the past 48 hour(s)).  Blood Alcohol level:  Lab Results  Component Value Date   ETH <10 01/05/2023   ETH <10 09/25/2022    Metabolic Disorder Labs: Lab Results  Component Value Date   HGBA1C 4.7 (L) 04/18/2020   MPG 88 04/18/2020   No results found for: "PROLACTIN" Lab Results  Component Value Date   CHOL 181 08/03/2022   TRIG 52 08/03/2022   HDL 71 08/03/2022   CHOLHDL 2.5 08/03/2022   VLDL 8 04/18/2020   LDLCALC 97 08/03/2022   LDLCALC 82 08/06/2021      Musculoskeletal: Strength & Muscle Tone: within normal limits Gait & Station: normal Patient leans: N/A  Psychiatric Specialty Exam:  Presentation  General Appearance:  Casual  Eye Contact: Fair  Speech: Clear and Coherent  Speech Volume: Normal  Handedness: Right  Mood and Affect  Mood: Fine  Affect: Stable   Thought Process  Thought Processes: Coherent  Descriptions of Associations:Intact  Orientation:Full (Time, Place and Person)  Thought Content: Improved  History of Schizophrenia/Schizoaffective disorder:No  Duration of Psychotic Symptoms:N/A  Hallucinations:Denies Ideas of Reference:None  Suicidal Thoughts:Denies Homicidal Thoughts:Denies  Sensorium  Memory: Immediate Fair; Recent Fair; Remote Poor  Judgment: Improving  Insight: Improving   Executive Functions  Concentration: Fair  Attention Span: Fair  Recall: [employer] of Knowledge: Fair  Language: Fair   Assets  Assets: Physical Health; Communication Skills   Sleep  Sleep:Improved    Blood pressure 126/80, pulse 72, temperature 98 F (36.7 C), temperature source Oral, resp. rate (!) 21, height 6' (1.829 m), weight 97.5 kg, SpO2 100 %. Body mass index is 29.16 kg/m.   Treatment Plan Summary: Continue on current treatment, ELOS 1-2 days  Lewanda Rife, MD

## 2023-01-10 NOTE — Group Note (Signed)
Date:  01/10/2023 Time:  11:07 AM  Group Topic/Focus:  Goals Group:   The focus of this group is to help patients establish daily goals to achieve during treatment and discuss how the patient can incorporate goal setting into their daily lives to aide in recovery.    Participation Level:  Active  Participation Quality:  Appropriate  Affect:  Appropriate  Cognitive:  Appropriate  Insight: Appropriate  Engagement in Group:  Engaged  Modes of Intervention:  Discussion, Education, and Support  Additional Comments:    Wilford Corner 01/10/2023, 11:07 AM

## 2023-01-10 NOTE — Group Note (Signed)
Date:  01/10/2023 Time:  10:44 PM  Group Topic/Focus:  Recovery Goals:   The focus of this group is to identify appropriate goals for recovery and establish a plan to achieve them.    Participation Level:  Active  Participation Quality:  Appropriate  Affect:  Appropriate  Cognitive:  Appropriate  Insight: Appropriate  Engagement in Group:  Engaged  Modes of Intervention:  Discussion  Additional Comments:    Lenore Cordia 01/10/2023, 10:44 PM
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
## 2023-01-10 NOTE — Progress Notes (Signed)
Nursing Shift Note:  1900-0700  Attended Evening Group: Yes Medication Compliant:  Yes Behavior: Cooperative Sleep Quality: Good Significant Changes: None   01/10/23 0100  Psych Admission Type (Psych Patients Only)  Admission Status Involuntary  Psychosocial Assessment  Patient Complaints Anxiety  Eye Contact Fair  Facial Expression Flat  Affect Appropriate to circumstance  Speech Logical/coherent  Interaction Assertive  Motor Activity Slow  Appearance/Hygiene In scrubs  Behavior Characteristics Cooperative  Mood Pleasant  Thought Process  Coherency WDL  Content WDL  Delusions None reported or observed  Perception WDL  Hallucination None reported or observed  Judgment Impaired  Confusion WDL  Danger to Self  Current suicidal ideation? Denies  Danger to Others  Danger to Others None reported or observed

## 2023-01-10 NOTE — Group Note (Signed)
[facility] LCSW Group Therapy Note   Group Date: 01/10/2023 Start Time: 1300 End Time: 1340  Type of Therapy/Topic:  Group Therapy:  Feelings about Diagnosis  Participation Level:  None     Description of Group:    This group will allow patients to explore their thoughts and feelings about diagnoses they have received. Patients will be guided to explore their level of understanding and acceptance of these diagnoses. Facilitator will encourage patients to process their thoughts and feelings about the reactions of others to their diagnosis, and will guide patients in identifying ways to discuss their diagnosis with significant others in their lives. This group will be process-oriented, with patients participating in exploration of their own experiences as well as giving and receiving support and challenge from other group members.   Therapeutic Goals: 1. Patient will demonstrate understanding of diagnosis as evidence by identifying two or more symptoms of the disorder:  2. Patient will be able to express two feelings regarding the diagnosis 3. Patient will demonstrate ability to communicate their needs through discussion and/or role plays  Summary of Patient Progress: Patient came into the group close to the end of the discussion. During his time in the room, he appeared to attend to the conversation and could be seen nodding in agreement with peers. Insight into the topic is questionable. However, he did appear open and receptive to feedback/comments from both peers and facilitator.    Therapeutic Modalities:   Cognitive Behavioral Therapy Brief Therapy Feelings Identification    Glenis Smoker, LCSW

## 2023-01-10 NOTE — Group Note (Signed)
Date:  01/10/2023 Time:  6:44 PM  Group Topic/Focus:  Self Care:   The focus of this group is to help patients understand the importance of self-care in order to improve or restore emotional, physical, spiritual, interpersonal, and financial health. Wellness Toolbox:   The focus of this group is to discuss various aspects of wellness, balancing those aspects and exploring ways to increase the ability to experience wellness.  Patients will create a wellness toolbox for use upon discharge.    Participation Level:  Active  Participation Quality:  Appropriate  Affect:  Appropriate  Cognitive:  Alert and Appropriate  Insight: Appropriate and Good  Engagement in Group:  Engaged  Modes of Intervention:  Activity and Discussion  Additional Comments:    Doug Sou 01/10/2023, 6:44 PM

## 2023-01-10 NOTE — BHH Counselor (Signed)
CSW spoke with pt briefly about his plans post discharge. Pt shared that he has a friend in [place] that he can stay with. He explained that he just needs to get back to [facility] and he can find transportation to his friends house. Pt asked what options were available. CSW explained the disposition options available [employer], boarding houses, transitional houses, and shelters). Pt not interested in any of these options and maintains that he plans to go to stay with friend in [place]. He does again remind CSW that he just needs to get to [place]. No other concerns expressed. Contact ended without incident.   Vilma Meckel. Algis Greenhouse, MSW, LCSW, LCAS 01/10/2023 4:34 PM
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
## 2023-01-11 NOTE — Progress Notes (Signed)
[facility] MD Progress Note  01/11/2023 8:40 PM Justin [name]  MRN:  [id number] Subjective:  Patient seen during rounds, chart reviewed, discussed with staff. Patient is doing fine on the unit.  Patient continues to report improvement in symptoms.  Patient reports that he might be able to go to his friend's house Tomorrow.  He denies thoughts of self-harm.  Denies any psychotic symptoms. Patient was  encouraged to continue attending groups and work on a safe discharge plan.    Principal Problem: MDD (major depressive disorder), recurrent, severe, with psychosis (HCC) Diagnosis: Principal Problem:   MDD (major depressive disorder), recurrent, severe, with psychosis (HCC)  Total Time spent with patient: 15 minutes  Past Psychiatric History: Unremarkable  Past Medical History:  Past Medical History:  Diagnosis Date   Chronic hepatitis C without hepatic coma (HCC) 08/17/2017   Endocarditis    History of syphilis 08/25/2014   Treated by [facility] 07-2014.   HIV (human immunodeficiency virus infection) (HCC)    Seizures (HCC)     Past Surgical History:  Procedure Laterality Date   hand surgery Right    LUMBAR PUNCTURE  08/08/2019       WRIST SURGERY     Family History:  Family History  Adopted: Yes  Problem Relation Age of Onset   Diabetes type II Mother    Family Psychiatric  History: Unremarkable Social History:  Social History   Substance and Sexual Activity  Alcohol Use Not Currently   Comment: in rehab     Social History   Substance and Sexual Activity  Drug Use Yes   Types: Marijuana, Methamphetamines   Comment: states last meth use was 08/2021    Social History   Socioeconomic History   Marital status: Single    Spouse name: Not on file   Number of children: Not on file   Years of education: Not on file   Highest education level: Not on file  Occupational History   Not on file  Tobacco Use   Smoking status: Every Day    Types: Cigarettes   Smokeless tobacco:  Never  Vaping Use   Vaping Use: Never used  Substance and Sexual Activity   Alcohol use: Not Currently    Comment: in rehab   Drug use: Yes    Types: Marijuana, Methamphetamines    Comment: states last meth use was 08/2021   Sexual activity: Not Currently    Partners: Male    Birth control/protection: Condom    Comment: given condoms  Other Topics Concern   Not on file  Social History Narrative   ** Merged History Encounter **       ** Merged History Encounter **       ** Merged History Encounter **       ** Merged History Encounter **       Social Determinants of Health   Financial Resource Strain: Not on file  Food Insecurity: Food Insecurity Present (01/07/2023)   Hunger Vital Sign    Worried About Running Out of Food in the Last Year: Sometimes true    Ran Out of Food in the Last Year: Sometimes true  Transportation Needs: Unmet Transportation Needs (01/07/2023)   PRAPARE - Administrator, Civil Service (Medical): Yes    Lack of Transportation (Non-Medical): Yes  Physical Activity: Not on file  Stress: Not on file  Social Connections: Not on file   Additional Social History:  Sleep: Good  Appetite:  Good  Current Medications: Current Facility-Administered Medications  Medication Dose Route Frequency Provider Last Rate Last Admin   acetaminophen (TYLENOL) tablet 650 mg  650 mg Oral Q6H PRN Motley-Mangrum, Jadeka A, PMHNP   650 mg at 01/09/23 2123   alum & mag hydroxide-simeth (MAALOX/MYLANTA) 200-200-20 MG/5ML suspension 30 mL  30 mL Oral Q4H PRN Motley-Mangrum, Geralynn Ochs A, PMHNP       bacitracin ointment   Topical Daily Clapacs, Jackquline Denmark, MD   1 Application at 01/11/23 0800   bictegravir-emtricitabine-tenofovir AF (BIKTARVY) 50-200-25 MG per tablet 1 tablet  1 tablet Oral Daily Motley-Mangrum, Jadeka A, PMHNP   1 tablet at 01/11/23 0800   diphenhydrAMINE (BENADRYL) capsule 50 mg  50 mg Oral TID PRN Motley-Mangrum, Jadeka A,  PMHNP   50 mg at 01/07/23 1822   Or   diphenhydrAMINE (BENADRYL) injection 50 mg  50 mg Intramuscular TID PRN Motley-Mangrum, Jadeka A, PMHNP       escitalopram (LEXAPRO) tablet 10 mg  10 mg Oral Daily Motley-Mangrum, Jadeka A, PMHNP   10 mg at 01/11/23 0800   haloperidol (HALDOL) tablet 5 mg  5 mg Oral TID PRN Motley-Mangrum, Ezra Sites, PMHNP   5 mg at 01/07/23 1610   Or   haloperidol lactate (HALDOL) injection 5 mg  5 mg Intramuscular TID PRN Motley-Mangrum, Geralynn Ochs A, PMHNP       hydrOXYzine (ATARAX) tablet 25 mg  25 mg Oral TID PRN Motley-Mangrum, Jadeka A, PMHNP   25 mg at 01/10/23 2104   levETIRAcetam (KEPPRA) tablet 1,500 mg  1,500 mg Oral BID Sarina Ill, DO   1,500 mg at 01/11/23 1758   LORazepam (ATIVAN) tablet 2 mg  2 mg Oral TID PRN Motley-Mangrum, Ezra Sites, PMHNP   2 mg at 01/07/23 1821   Or   LORazepam (ATIVAN) injection 2 mg  2 mg Intramuscular TID PRN Motley-Mangrum, Jadeka A, PMHNP       magnesium hydroxide (MILK OF MAGNESIA) suspension 30 mL  30 mL Oral Daily PRN Motley-Mangrum, Jadeka A, PMHNP       OLANZapine zydis (ZYPREXA) disintegrating tablet 5 mg  5 mg Oral BID Motley-Mangrum, Jadeka A, PMHNP   5 mg at 01/11/23 1758   traZODone (DESYREL) tablet 50 mg  50 mg Oral QHS PRN Motley-Mangrum, Jadeka A, PMHNP   50 mg at 01/10/23 2104    Lab Results: No results found for this or any previous visit (from the past 48 hour(s)).  Blood Alcohol level:  Lab Results  Component Value Date   ETH <10 01/05/2023   ETH <10 09/25/2022    Metabolic Disorder Labs: Lab Results  Component Value Date   HGBA1C 4.7 (L) 04/18/2020   MPG 88 04/18/2020   No results found for: "PROLACTIN" Lab Results  Component Value Date   CHOL 181 08/03/2022   TRIG 52 08/03/2022   HDL 71 08/03/2022   CHOLHDL 2.5 08/03/2022   VLDL 8 04/18/2020   LDLCALC 97 08/03/2022   LDLCALC 82 08/06/2021     Musculoskeletal: Strength & Muscle Tone: within normal limits Gait & Station:  normal Patient leans: N/A  Psychiatric Specialty Exam:  Presentation  General Appearance:  Casual  Eye Contact: Fair  Speech: Clear and Coherent  Speech Volume: Normal  Handedness: Right   Mood and Affect  Mood: Fine  Affect: Stable   Thought Process  Thought Processes: Coherent  Descriptions of Associations:Intact  Orientation:Full (Time, Place and Person)  Thought Content: Improved  History  of Schizophrenia/Schizoaffective disorder:No   Hallucinations:Denies Ideas of Reference:None  Suicidal Thoughts:Denies Homicidal Thoughts:Denies  Sensorium  Memory: Fair  Judgment: Improving  Insight: Improving   Executive Functions  Concentration: Fair  Attention Span: Fair  Recall: [employer] of Knowledge: Fair  Language: Fair   Assets  Assets: Physical Health; Communication Skills   Sleep  Sleep:Improved    Blood pressure 121/89, pulse 75, temperature (!) 97.5 F (36.4 C), temperature source Oral, resp. rate (!) 23, height 6' (1.829 m), weight 97.5 kg, SpO2 100 %. Body mass index is 29.16 kg/m.   Treatment Plan Summary: Continue on current treatment, anticipated discharge tomorrow  Lewanda Rife, MD

## 2023-01-11 NOTE — Progress Notes (Signed)
Patient calm and pleasant during assessment denying SI/HI/AVH. Pt observed interacting appropriately with staff and peers on the unit. Pt compliant with medication administration per MD orders. Pt being monitored Q 15 minutes for safety per unit protocol, remains safe on the unit. Pt stated to this writer that he feels ready to D/C tomorrow

## 2023-01-11 NOTE — Group Note (Signed)
Date:  01/11/2023 Time:  10:12 AM  Group Topic/Focus:  Goals Group:   The focus of this group is to help patients establish daily goals to achieve during treatment and discuss how the patient can incorporate goal setting into their daily lives to aide in recovery.    Participation Level:  Did Not Attend  Lynelle Smoke [facility] 01/11/2023, 10:12 AM

## 2023-01-11 NOTE — Group Note (Signed)
Date:  01/11/2023 Time:  6:28 PM  Group Topic/Focus:  Activity Group    Participation Level:  Active  Participation Quality:  Appropriate  Affect:  Appropriate  Cognitive:  Appropriate  Insight: Appropriate  Engagement in Group:  Engaged  Modes of Intervention:  Activity  Additional Comments:    Yennifer Segovia A Gawain Crombie 01/11/2023, 6:28 PM

## 2023-01-11 NOTE — Group Note (Signed)
[facility] LCSW Group Therapy Note   Group Date: 01/11/2023 Start Time: 1330 End Time: 1430   Type of Therapy/Topic:  Group Therapy:  Emotion Regulation  Participation Level:  Did Not Attend   Mood:  Description of Group:    The purpose of this group is to assist patients in learning to regulate negative emotions and experience positive emotions. Patients will be guided to discuss ways in which they have been vulnerable to their negative emotions. These vulnerabilities will be juxtaposed with experiences of positive emotions or situations, and patients challenged to use positive emotions to combat negative ones. Special emphasis will be placed on coping with negative emotions in conflict situations, and patients will process healthy conflict resolution skills.  Therapeutic Goals: Patient will identify two positive emotions or experiences to reflect on in order to balance out negative emotions:  Patient will label two or more emotions that they find the most difficult to experience:  Patient will be able to demonstrate positive conflict resolution skills through discussion or role plays:   Summary of Patient Progress: Patient declined to attend group despite encouragement from this clinician     Therapeutic Modalities:   Cognitive Behavioral Therapy Feelings Identification Dialectical Behavioral Therapy   Arnell Mausolf J Kindra Bickham, LCSW 

## 2023-01-11 NOTE — Group Note (Signed)
Recreation Therapy Group Note   Group Topic:Self-Esteem  Group Date: 01/11/2023 Start Time: 1010 End Time: 1105 Facilitators: Rosina Lowenstein, LRT, CTRS Location:  Craft Room  Group Description: My Strengths and Qualities. Patients and LRT discussed the importance of self-love and self-esteem. Pt completed a worksheet that helps them identify 24 different strengths and qualities about themselves. Pt encouraged to read aloud at least 3 off their sheet to the group. LRT and pts discussed how this can be applied to daily life post-discharge.  Pt's then played "Positive Affirmation Bingo" afterwards, with journals, activity books or stress balls as bingo prizes.  Goal Area(s) Addressed: Patient will identify positive qualities about themselves. Patient will learn new positive affirmations.  Patient will recite positive qualities and affirmations aloud to the group.  Patient will increase communication.   Affect/Mood: N/A   Participation Level: Did not attend    Clinical Observations/Individualized Feedback: Estiben did not attend group.  Plan: Continue to engage patient in RT group sessions 2-3x/week.   Rosina Lowenstein, LRT, CTRS 01/11/2023 11:32 AM

## 2023-01-11 NOTE — Progress Notes (Signed)
Patient calm and pleasant during assessment denying SI/HI/AVH. Pt observed interacting appropriately with staff and peers on the unit. Pt compliant with medication administration per MD orders. Pt being monitored Q 15 minutes for safety per unit protocol, remains safe on the unit 

## 2023-01-11 NOTE — Plan of Care (Signed)

## 2023-01-12 MED ORDER — OLANZAPINE 5 MG PO TBDP: 5.0000 mg | ORAL_TABLET | Freq: Two times a day (BID) | ORAL | 0 refills | Status: DC

## 2023-01-12 MED ORDER — TRAZODONE HCL 50 MG PO TABS: 50.0000 mg | ORAL_TABLET | Freq: Every evening | ORAL | 0 refills | Status: DC | PRN

## 2023-01-12 MED ORDER — ESCITALOPRAM OXALATE 10 MG PO TABS
10.0000 mg | ORAL_TABLET | Freq: Every day | ORAL | 2 refills | Status: DC
Start: 2023-01-12 — End: 2023-02-08

## 2023-01-12 MED ORDER — LEVETIRACETAM 750 MG PO TABS: 1500.0000 mg | ORAL_TABLET | Freq: Two times a day (BID) | ORAL | 0 refills | Status: DC

## 2023-01-12 MED ORDER — BIKTARVY 50-200-25 MG PO TABS: 1.0000 | ORAL_TABLET | Freq: Every day | ORAL | 0 refills | Status: DC

## 2023-01-12 MED ORDER — HYDROXYZINE HCL 25 MG PO TABS: 25.0000 mg | ORAL_TABLET | Freq: Three times a day (TID) | ORAL | 0 refills | Status: DC | PRN

## 2023-01-12 NOTE — Group Note (Signed)
Recreation Therapy Group Note   Group Topic:Relaxation  Group Date: 01/12/2023 Start Time: 1000 End Time: 1040 Facilitators: Rosina Lowenstein, LRT, CTRS Location:  Craft Room  Group Description: PMR (Progressive Muscle Relaxation). LRT asks patients their current level of stress/anxiety from 1-10, with 10 being the highest. LRT educates patients on what PMR is and the benefits that come from it. Patients are asked to sit with their feet flat on the floor while sitting up and all the way back in their chair, if possible. LRT and pts follow a prompt through a speaker that requires you to tense and release different muscles in their body and focus on their breathing. During session, lights are off and soft music is being played. At the end of the prompt, LRT asks patients to rank their current levels of stress/anxiety from 1-10, 10 being the highest.   Goal Area(s) Addressed:  Patients will be able to describe progressive muscle relaxation.  Patient will practice using relaxation technique. Patient will identify a new coping skill.  Patient will follow multistep directions to reduce anxiety and stress.   Affect/Mood: N/A   Participation Level: Did not attend    Clinical Observations/Individualized Feedback: Justin [name] did not attend group.  Plan: Continue to engage patient in RT group sessions 2-3x/week.   Rosina Lowenstein, LRT, CTRS 01/12/2023 11:00 AM

## 2023-01-12 NOTE — Progress Notes (Signed)
Patient was discharged home via taxi in stable condition.  All belongings returned.  All teaching given.

## 2023-01-12 NOTE — Progress Notes (Addendum)
  [facility] Adult Case Management Discharge Plan :  Will you be returning to the same living situation after discharge:  No.  Patient reports that he is going to a peers home.  At discharge, do you have transportation home?: Yes,  CSW to assist with transportation home. Do you have the ability to pay for your medications: No.  Release of information consent forms completed and in the chart;  Patient's signature needed at discharge.  Patient to Follow up at:  Follow-up Information     [facility] Follow up.   Specialty: Professional Counselor Why: Walk in hours are from Monday through Friday, 9AM-1PM. Contact information: [facility] of the [place] [address] [place] [address] [phone]         [facility] Follow up.   Why: Walk in hours are from 8AM-2PM, Monday through Friday. Contact information: [address] [place] [address] [phone]         [facility] Follow up.   Specialty: Urgent Care Why: Therapy: Monday - 8am - 1pm, Tuesday 8am-1pm, Wednesday 8am-1pm, Friday 1pm-4pm  Medication Management: Monday 8am - 11am, Tuesday 8am-11am, Wednesday 8am-11am, Thursday 8am-11am, Friday 8am-10am Contact information: [address] [place] [address] [phone]                Next level of care provider has access to [facility] Link:no  Safety Planning and Suicide Prevention discussed: Yes,  SPE completed with the patient.      Has patient been referred to the Quitline?: Patient refused referral for treatment  Patient has been referred for addiction treatment: Patient refused referral for treatment; referral information given to patient at discharge.  Harden Mo, LCSW 01/12/2023, 10:42 AM
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
## 2023-01-12 NOTE — Plan of Care (Signed)
  Problem: Clinical Measurements: Goal: Ability to maintain clinical measurements within normal limits will improve Outcome: Progressing   Problem: Coping: Goal: Level of anxiety will decrease Outcome: Progressing   

## 2023-01-12 NOTE — Discharge Summary (Signed)
Physician Discharge Summary Note  Patient:  Justin [name] is an 42 y.o., male MRN:  [id number] DOB:  03-13-1981 Patient phone:  [phone] (home)  Patient address:   [address] 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 Patient was treated in ED and upon discharge he informed team of suicidal ideations and ingested 17 tabs of Keppra 250 mg that he had on his person in an attempt to self harm. He was assessed by psychiatry and recommended for inpatient treatment.   Principal Problem: MDD (major depressive disorder), recurrent, severe, with psychosis (HCC) Discharge Diagnoses: Principal Problem:   MDD (major depressive disorder), recurrent, severe, with psychosis (HCC)   Past Psychiatric History:  MDD recurrent severe with psychosis, adjustment disorder with mixed emotional features, HIV, anxiety, substance induced disorder, polysubstance abuse, amphetamine use disorder, seizures.     Past Medical History:  Past Medical History:  Diagnosis Date   Chronic hepatitis C without hepatic coma (HCC) 08/17/2017   Endocarditis    History of syphilis 08/25/2014   Treated by [facility] 07-2014.   HIV (human immunodeficiency virus infection) (HCC)    Seizures (HCC)     Past Surgical History:  Procedure Laterality Date   hand surgery Right    LUMBAR PUNCTURE  08/08/2019       WRIST SURGERY     Family History:  Family History  Adopted: Yes  Problem Relation Age of Onset   Diabetes type II Mother    Social History:  Social History   Substance and  Sexual Activity  Alcohol Use Not Currently   Comment: in rehab     Social History   Substance and Sexual Activity  Drug Use Yes   Types: Marijuana, Methamphetamines   Comment: states last meth use was 08/2021    Social History   Socioeconomic History   Marital status: Single    Spouse name: Not on file   Number of children: Not on file   Years of education: Not on file   Highest education level: Not on file  Occupational History   Not on file  Tobacco Use   Smoking status: Every Day    Types: Cigarettes   Smokeless tobacco: Never  Vaping Use   Vaping status: Never Used  Substance and Sexual Activity   Alcohol use: Not Currently    Comment: in rehab   Drug use: Yes    Types: Marijuana, Methamphetamines    Comment: states last meth use was 08/2021   Sexual activity: Not Currently    Partners: Male    Birth control/protection: Condom    Comment: given condoms  Other Topics Concern   Not on file  Social History Narrative   ** Merged History Encounter **       ** Merged History Encounter **       ** Merged History Encounter **       ** Merged History Encounter **       Social Determinants of Health   Financial Resource Strain: Not on file  Food Insecurity: Food Insecurity Present (01/07/2023)   Hunger Vital Sign    Worried About  Running Out of Food in the Last Year: Sometimes true    Ran Out of Food in the Last Year: Sometimes true  Transportation Needs: Unmet Transportation Needs (01/07/2023)   PRAPARE - Administrator, Civil Service (Medical): Yes    Lack of Transportation (Non-Medical): Yes  Physical Activity: Not on file  Stress: Not on file  Social Connections: Not on file    Hospital Course:   The patient was admitted to Inpatient psychiatric treatment for stabilization of depression, suicide thoughts, and psychosis. Patient was placed on suicidal precautions. The patient was evaluated and treated by the multidisciplinary treatment team  including physicians, nurses, social workers and therapists. All medications were presented to the patient and the Patient gave consent to all the medications that they were given, as well as was explained the risks, benefits, side effects and alternatives of all medication therapies. The patient was integrated into the general milieu on the ward and encouraged to attend to his ADLs and participate in all groups and activities. During hospital course the Patient attended coping skill groups, music therapy and activity therapy groups. Patient was counseled on cognitive techniques/skills by multiple staff members and given support care by the staff.  Patient's medication regimen was evaluated and titrated to therapeutic levels to better Patient's overall daily functioning.  Patient was followed by Dr. Marlou Porch.  Patient was started on home medications including Keppra and his HIV meds.  Patient was started on Lexapro and olanzapine for depression and psychosis.  He tolerated the medication well with no significant side effects.  During the hospitalization, the patient demonstrated a stabilization of mood and psychosis, with decreased racing thoughts, decreased impulsivity, improved sleep and decreased irritability. At the time of discharge, the patient denied any suicidal ideation/homicidal ideation and was not overtly depressed, manic or psychotic. The Patient was interacting well in groups and on the unit with their peers. Patient was able to identify a safety plan to include speaking with family, contacting outpatient provider or calling 911 if hallucinations/delusions returned or worsened or thoughts of self-harm or suicide return. Patient was counselled on outpatient follow-up that was arranged prior to discharge.  MENTAL STATUS EXAMINATION AT DISCHARGE:  APPEARANCE: The patient is dressed appropriately, maintains adequate grooming and hygiene.  GAIT AND/OR STATION: The patient appears steady on his feet  and ambulates without difficulty.  MOTOR ACTIVITY: The patient demonstrates full range of motion and ability to move all 4 extremities.  AFFECT: Bright affect.  MOOD: Improved Mood.  ORIENTATION: Fully oriented to person, place, time, and situation. THOUGHT PROCESSES: Linear, organized, and goal directed. ASSOCIATIONS: Coherent.  SPEECH: Normal rate, volume, and prosody.  LANGUAGE: Intact and appropriate for naming of objects and completing of phrases.  DELUSIONS, HALLUCINATIONS, PARANOIA: None.  CONCENTRATION: Grossly intact, tested via interview.  HOMICIDAL AND SUICIDAL IDEATION: The patient denies suicidal and homicidal ideation.  CAPACITY FOR SELF-HARM OR HARM TO OTHERS: Reduced from Admission due to treatment of Depression and counseling about safety techniques/coping skills and referral to resources.   Musculoskeletal: Strength & Muscle Tone: within normal limits Gait & Station: normal Patient leans: N/A   Psychiatric Specialty Exam:  Presentation  General Appearance:  Casual  Eye Contact: Fair  Speech: Clear and Coherent  Speech Volume: Normal  Handedness: Right   Mood and Affect  Mood: Dysphoric  Affect: Congruent; Blunt   Thought Process  Thought Processes: Coherent  Descriptions of Associations:Intact  Orientation:Full (Time, Place and Person)  Thought Content:Perseveration; Logical  History of  Schizophrenia/Schizoaffective disorder:No  Duration of Psychotic Symptoms:N/A  Hallucinations:No data recorded Ideas of Reference:Paranoia  Suicidal Thoughts:No data recorded Homicidal Thoughts:No data recorded  Sensorium  Memory: Immediate Fair; Recent Fair; Remote Poor  Judgment: Poor  Insight: Shallow   Executive Functions  Concentration: Fair  Attention Span: Fair  Recall: Fair  Fund of Knowledge: Fair  Language: Fair   Psychomotor Activity  Psychomotor Activity:No data recorded  Assets  Assets: Physical Health;  Communication Skills   Sleep  Sleep:No data recorded   Physical Exam: Constitutional:      Appearance: He is normal weight.  HENT:     Head: Normocephalic.     Nose: Nose normal.  Cardiovascular:     Rate and Rhythm: Normal rate.     Pulses: Normal pulses.  Pulmonary:     Effort: Pulmonary effort is normal.  Abdominal:     Palpations: Abdomen is soft.  Musculoskeletal:        General: Normal range of motion.     Cervical back: Normal range of motion.  Skin:    General: Skin is warm.     Comments: X3 wounds on buttocks from [facility]  Neurological:     Mental Status: He is alert and oriented to person, place, and time. Mental status is at baseline.    Review of Systems  Constitutional: Negative.   HENT: Negative.    Eyes: Negative.   Respiratory: Negative.    Cardiovascular: Negative.   Genitourinary: Negative.   Neurological: Negative.   Psychiatric/Behavioral: Negative.     Blood pressure 101/63, pulse 72, temperature 98.1 F (36.7 C), temperature source Oral, resp. rate 16, height 6' (1.829 m), weight 97.5 kg, SpO2 97%. Body mass index is 29.16 kg/m.   Social History   Tobacco Use  Smoking Status Every Day   Types: Cigarettes  Smokeless Tobacco Never   Tobacco Cessation:  N/A, patient does not currently use tobacco products   Blood Alcohol level:  Lab Results  Component Value Date   ETH <10 01/05/2023   ETH <10 09/25/2022    Metabolic Disorder Labs:  Lab Results  Component Value Date   HGBA1C 4.7 (L) 04/18/2020   MPG 88 04/18/2020   No results found for: "PROLACTIN" Lab Results  Component Value Date   CHOL 181 08/03/2022   TRIG 52 08/03/2022   HDL 71 08/03/2022   CHOLHDL 2.5 08/03/2022   VLDL 8 04/18/2020   LDLCALC 97 08/03/2022   LDLCALC 82 08/06/2021    See Psychiatric Specialty Exam and Suicide Risk Assessment completed by Attending Physician prior to discharge.  Discharge destination:  Home  Is patient on multiple antipsychotic  therapies at discharge:  No    Recommended Plan for Multiple Antipsychotic Therapies: NA  Discharge Instructions     Discharge wound care:   Complete by: As directed    Per SW      Allergies as of 01/12/2023   No Known Allergies      Medication List     TAKE these medications      Indication  Biktarvy 50-200-25 MG Tabs tablet Generic drug: bictegravir-emtricitabine-tenofovir AF Take 1 tablet by mouth daily. Start taking on: January 13, 2023  Indication: HIV Disease   escitalopram 10 MG tablet Commonly known as: LEXAPRO Take 1 tablet (10 mg total) by mouth daily.  Indication: Major Depressive Disorder   hydrOXYzine 25 MG tablet Commonly known as: ATARAX Take 1 tablet (25 mg total) by mouth 3 (three) times daily as needed for anxiety.  Indication: Feeling Anxious   levETIRAcetam 750 MG tablet Commonly known as: KEPPRA Take 2 tablets (1,500 mg total) by mouth 2 (two) times daily. What changed: how much to take  Indication: Seizure   OLANZapine zydis 5 MG disintegrating tablet Commonly known as: ZYPREXA Take 1 tablet (5 mg total) by mouth 2 (two) times daily. What changed: when to take this  Indication: Psychotic Depressive Illness   traZODone 50 MG tablet Commonly known as: DESYREL Take 1 tablet (50 mg total) by mouth at bedtime as needed for sleep.  Indication: Major Depressive Disorder        Follow-up Information     [facility] Follow up.   Specialty: Professional Counselor Why: Walk in hours are from Monday through Friday, 9AM-1PM. Contact information: [facility] [place] [address] [place] [address] [phone]         [facility] Follow up.   Why: Walk in hours are from 8AM-2PM, Monday through Friday. Contact information: [address] [place] [address] [phone]         [facility] Follow up.   Specialty: Urgent Care Why:  Therapy: Monday - 8am - 1pm, Tuesday 8am-1pm, Wednesday 8am-1pm, Friday 1pm-4pm  Medication Management: Monday 8am - 11am, Tuesday 8am-11am, Wednesday 8am-11am, Thursday 8am-11am, Friday 8am-10am Contact information: [address] [place] [address] [phone]                PATIENTS CONDITION AT DISCHARGE:  Stable  TOBACCO CESSATION SCREENING  Patient was screened and counselled on smoking cessation at time of discharge. Pt is not a smoker or smokes less than a quarter pack per day and have verbalized that they do not require assistance with tobacco cessation at this time.  Patient was screened and counselled on tobacco cessation at time of discharge. Patient verbalized a desire for medical assistance with tobacco cessation. Patient has been provided with Prescription medication for tobacco cessation and these medications are listed in Patient's discharge medication list.  PRESCRIPTION ARE LOCATED:  On Chart  DISCHARGE INSTRUCTIONS:  1. Diet: Regular  2. Activity: As tolerated  3. Take medications as prescribed and not to make any changes without first consulting with the outpatient provider.  4. Patient was advised to avoid any illicit drugs or alcohol due to negative impact on physical and mental health.  5. Patient should keep all follow up appointments.  TIME SPENT ON DISCHARGE: Over 35 minutes were spent on this patients discharge including a face to face encounter, patient counseling and preparation of discharge materials.  Signed: Lewanda Rife, MD

## 2023-01-16 ENCOUNTER — Other Ambulatory Visit: Payer: Self-pay

## 2023-01-16 ENCOUNTER — Encounter (HOSPITAL_COMMUNITY): Payer: Self-pay

## 2023-01-16 ENCOUNTER — Emergency Department (HOSPITAL_COMMUNITY)
Admission: EM | Admit: 2023-01-16 | Discharge: 2023-01-17 | Disposition: A | Discharge status: Home / Self Care | Payer: Self-pay | Attending: Emergency Medicine | Admitting: Emergency Medicine

## 2023-01-16 DIAGNOSIS — R569 Unspecified convulsions: Secondary | ICD-10-CM | POA: Insufficient documentation

## 2023-01-16 DIAGNOSIS — F1910 Other psychoactive substance abuse, uncomplicated: Secondary | ICD-10-CM | POA: Insufficient documentation

## 2023-01-16 DIAGNOSIS — E876 Hypokalemia: Secondary | ICD-10-CM

## 2023-01-16 DIAGNOSIS — Z59819 Housing instability, housed unspecified: Secondary | ICD-10-CM

## 2023-01-16 DIAGNOSIS — F1994 Other psychoactive substance use, unspecified with psychoactive substance-induced mood disorder: Secondary | ICD-10-CM

## 2023-01-16 DIAGNOSIS — G40909 Epilepsy, unspecified, not intractable, without status epilepticus: Secondary | ICD-10-CM

## 2023-01-16 DIAGNOSIS — F1510 Other stimulant abuse, uncomplicated: Secondary | ICD-10-CM

## 2023-01-16 DIAGNOSIS — Z21 Asymptomatic human immunodeficiency virus [HIV] infection status: Secondary | ICD-10-CM | POA: Insufficient documentation

## 2023-01-16 LAB — CBC WITH DIFFERENTIAL/PLATELET
Abs Immature Granulocytes: 0.01 10*3/uL (ref 0.00–0.07)
Basophils Absolute: 0 10*3/uL (ref 0.0–0.1)
Basophils Relative: 0 %
Eosinophils Absolute: 0.1 10*3/uL (ref 0.0–0.5)
Eosinophils Relative: 1 %
HCT: 50.7 % (ref 39.0–52.0)
Hemoglobin: 16.8 g/dL (ref 13.0–17.0)
Immature Granulocytes: 0 %
Lymphocytes Relative: 24 %
Lymphs Abs: 1.8 10*3/uL (ref 0.7–4.0)
MCH: 32.8 pg (ref 26.0–34.0)
MCHC: 33.1 g/dL (ref 30.0–36.0)
MCV: 99 fL (ref 80.0–100.0)
Monocytes Absolute: 0.7 10*3/uL (ref 0.1–1.0)
Monocytes Relative: 10 %
Neutro Abs: 4.8 10*3/uL (ref 1.7–7.7)
Neutrophils Relative %: 65 %
Platelets: 265 10*3/uL (ref 150–400)
RBC: 5.12 MIL/uL (ref 4.22–5.81)
RDW: 12.4 % (ref 11.5–15.5)
WBC: 7.4 10*3/uL (ref 4.0–10.5)
nRBC: 0 % (ref 0.0–0.2)

## 2023-01-16 LAB — COMPREHENSIVE METABOLIC PANEL
ALT: 45 U/L — ABNORMAL HIGH (ref 0–44)
AST: 31 U/L (ref 15–41)
Albumin: 4.5 g/dL (ref 3.5–5.0)
Alkaline Phosphatase: 84 U/L (ref 38–126)
Anion gap: 8 (ref 5–15)
BUN: 14 mg/dL (ref 6–20)
CO2: 27 mmol/L (ref 22–32)
Calcium: 9.5 mg/dL (ref 8.9–10.3)
Chloride: 104 mmol/L (ref 98–111)
Creatinine, Ser: 1.19 mg/dL (ref 0.61–1.24)
GFR, Estimated: >60 mL/min (ref >60)
Glucose, Bld: 83 mg/dL (ref 70–99)
Potassium: 3.2 mmol/L — ABNORMAL LOW (ref 3.5–5.1)
Sodium: 139 mmol/L (ref 135–145)
Total Bilirubin: 1.9 mg/dL — ABNORMAL HIGH (ref 0.3–1.2)
Total Protein: 8 g/dL (ref 6.5–8.1)

## 2023-01-16 LAB — ETHANOL: Alcohol, Ethyl (B): <10 mg/dL (ref <10)

## 2023-01-16 MED ORDER — LEVETIRACETAM IN NACL 1000 MG/100ML IV SOLN
1000.0000 mg | Freq: Once | INTRAVENOUS | Status: AC
  Administered 2023-01-16: 1000 mg via INTRAVENOUS
  Filled 2023-01-16: qty 100

## 2023-01-16 NOTE — ED Notes (Signed)
Writer, NT, and [place], Paramedic came into patient's room to find him sitting in chair, IV out, lip-smacking and unable to answer questions or follow commands. Writer and prior mentioned persons aided patient back into bed. No injuries noted. Misty Stanley, PA notified. No new orders at this time.

## 2023-01-16 NOTE — ED Notes (Signed)
RN and Paramedic walked into room to find Pt sitting in chair, confused and non-verbal. Pt had removed IV.  Pt was able to follow directions to return to bed. No obvious injuries were noted to Pt. PA notified, no further orders given currently

## 2023-01-16 NOTE — ED Provider Notes (Signed)
[place] EMERGENCY DEPARTMENT AT [facility] Provider Note   CSN: [id number] Arrival date & time: 01/16/23  2051     History {Add pertinent medical, surgical, social history, OB history to HPI:1} Chief Complaint  Patient presents with   Seizures    Justin [name] is a 42 y.o. male.  The history is provided by the patient and medical records.  Seizures  42 year old male with history of anxiety, hepatitis C, HIV, seizure disorder, presenting to the ED for seizures.  Patient admits he has been having increased frequency of seizures lately, thinks it is because he is relapsed.  He admits to injecting amphetamines again recently.  He states "just life".  He does admit to some thoughts of self-harm, often wishing that he would not wake up after using the drugs.  He denies any alcohol use.  He has still been taking his seizure medications, but has been taking them late or at irregular intervals.  Reports today he had a "cluster" of seizures, approximately 4 but was waking up between episodes.  He states he still feels the aura of potentially having another seizure.  Home Medications Prior to Admission medications   Medication Sig Start Date End Date Taking? Authorizing Provider  bictegravir-emtricitabine-tenofovir AF (BIKTARVY) 50-200-25 MG TABS tablet Take 1 tablet by mouth daily. 01/13/23   Lewanda Rife, MD  escitalopram (LEXAPRO) 10 MG tablet Take 1 tablet (10 mg total) by mouth daily. 01/12/23   Lewanda Rife, MD  hydrOXYzine (ATARAX) 25 MG tablet Take 1 tablet (25 mg total) by mouth 3 (three) times daily as needed for anxiety. 01/12/23   Lewanda Rife, MD  levETIRAcetam (KEPPRA) 750 MG tablet Take 2 tablets (1,500 mg total) by mouth 2 (two) times daily. 01/12/23   Lewanda Rife, MD  OLANZapine zydis (ZYPREXA) 5 MG disintegrating tablet Take 1 tablet (5 mg total) by mouth 2 (two) times daily. 01/12/23   Lewanda Rife, MD  traZODone (DESYREL) 50 MG tablet  Take 1 tablet (50 mg total) by mouth at bedtime as needed for sleep. 01/12/23   Lewanda Rife, MD      Allergies    Patient has no known allergies.    Review of Systems   Review of Systems  Neurological:  Positive for seizures.  All other systems reviewed and are negative.   Physical Exam Updated Vital Signs BP 124/85   Pulse 87   Temp 97.7 F (36.5 C) (Oral)   Resp 16   Ht 6' (1.829 m)   Wt 90.7 kg   SpO2 100%   BMI 27.12 kg/m   Physical Exam Vitals and nursing note reviewed.  Constitutional:      Appearance: He is well-developed.  HENT:     Head: Normocephalic and atraumatic.  Eyes:     Conjunctiva/sclera: Conjunctivae normal.     Pupils: Pupils are equal, round, and reactive to light.  Cardiovascular:     Rate and Rhythm: Normal rate and regular rhythm.     Heart sounds: Normal heart sounds.  Pulmonary:     Effort: Pulmonary effort is normal.     Breath sounds: Normal breath sounds.  Abdominal:     General: Bowel sounds are normal.     Palpations: Abdomen is soft.  Musculoskeletal:        General: Normal range of motion.     Cervical back: Normal range of motion.  Skin:    General: Skin is warm and dry.  Neurological:     Mental Status:  He is alert and oriented to person, place, and time.     Comments: AAOx3, answering questions and following commands appropriately; equal strength UE and LE bilaterally; CN grossly intact; moves all extremities appropriately without ataxia; no focal neuro deficits or facial asymmetry appreciated     ED Results / Procedures / Treatments   Labs (all labs ordered are listed, but only abnormal results are displayed) Labs Reviewed  CBC WITH DIFFERENTIAL/PLATELET  COMPREHENSIVE METABOLIC PANEL  ETHANOL  RAPID URINE DRUG SCREEN, HOSP PERFORMED  LEVETIRACETAM LEVEL    EKG None  Radiology No results found.  Procedures Procedures  {Document cardiac monitor, telemetry assessment procedure when  appropriate:1}  Medications Ordered in ED Medications  levETIRAcetam (KEPPRA) IVPB 1000 mg/100 mL premix (has no administration in time range)    ED Course/ Medical Decision Making/ A&P   {   Click here for ABCD2, HEART and other calculatorsREFRESH Note before signing :1}                          Medical Decision Making Amount and/or Complexity of Data Reviewed Labs: ordered. ECG/medicine tests: ordered.  Risk Prescription drug management.   ***  {Document critical care time when appropriate:1} {Document review of labs and clinical decision tools ie heart score, Chads2Vasc2 etc:1}  {Document your independent review of radiology images, and any outside records:1} {Document your discussion with family members, caretakers, and with consultants:1} {Document social determinants of health affecting pt's care:1} {Document your decision making why or why not admission, treatments were needed:1} Final Clinical Impression(s) / ED Diagnoses Final diagnoses:  None    Rx / DC Orders ED Discharge Orders     None

## 2023-01-16 NOTE — ED Triage Notes (Signed)
Arrives POV via friend after a reported seizure. Says he takes 3000mg  Keppra tonight and maybe took it 30 minutes late.   Says he has a sensation of having another seizure.

## 2023-01-17 ENCOUNTER — Ambulatory Visit (HOSPITAL_COMMUNITY)
Admission: EM | Admit: 2023-01-17 | Discharge: 2023-01-17 | Disposition: A | Discharge status: Other Acute Inpatient Hospital | Payer: No Payment, Other | Attending: Psychiatry | Admitting: Psychiatry

## 2023-01-17 ENCOUNTER — Other Ambulatory Visit: Payer: Self-pay

## 2023-01-17 ENCOUNTER — Emergency Department (HOSPITAL_COMMUNITY)
Admission: EM | Admit: 2023-01-17 | Discharge: 2023-01-19 | Disposition: A | Discharge status: Home / Self Care | Payer: No Payment, Other | Attending: Emergency Medicine | Admitting: Emergency Medicine

## 2023-01-17 ENCOUNTER — Encounter (HOSPITAL_COMMUNITY): Payer: Self-pay

## 2023-01-17 DIAGNOSIS — F323 Major depressive disorder, single episode, severe with psychotic features: Secondary | ICD-10-CM | POA: Insufficient documentation

## 2023-01-17 DIAGNOSIS — F1510 Other stimulant abuse, uncomplicated: Secondary | ICD-10-CM

## 2023-01-17 DIAGNOSIS — G40909 Epilepsy, unspecified, not intractable, without status epilepticus: Secondary | ICD-10-CM

## 2023-01-17 DIAGNOSIS — R45851 Suicidal ideations: Secondary | ICD-10-CM | POA: Insufficient documentation

## 2023-01-17 DIAGNOSIS — D72829 Elevated white blood cell count, unspecified: Secondary | ICD-10-CM | POA: Insufficient documentation

## 2023-01-17 DIAGNOSIS — Z21 Asymptomatic human immunodeficiency virus [HIV] infection status: Secondary | ICD-10-CM | POA: Insufficient documentation

## 2023-01-17 DIAGNOSIS — Z5900 Homelessness unspecified: Secondary | ICD-10-CM | POA: Insufficient documentation

## 2023-01-17 DIAGNOSIS — Z79811 Long term (current) use of aromatase inhibitors: Secondary | ICD-10-CM | POA: Insufficient documentation

## 2023-01-17 DIAGNOSIS — F1520 Other stimulant dependence, uncomplicated: Secondary | ICD-10-CM | POA: Insufficient documentation

## 2023-01-17 DIAGNOSIS — F332 Major depressive disorder, recurrent severe without psychotic features: Secondary | ICD-10-CM | POA: Insufficient documentation

## 2023-01-17 DIAGNOSIS — F17210 Nicotine dependence, cigarettes, uncomplicated: Secondary | ICD-10-CM | POA: Insufficient documentation

## 2023-01-17 LAB — CBC WITH DIFFERENTIAL/PLATELET
Abs Immature Granulocytes: 0.01 10*3/uL (ref 0.00–0.07)
Basophils Absolute: 0 10*3/uL (ref 0.0–0.1)
Basophils Relative: 1 %
Eosinophils Absolute: 0.1 10*3/uL (ref 0.0–0.5)
Eosinophils Relative: 1 %
HCT: 47.5 % (ref 39.0–52.0)
Hemoglobin: 16 g/dL (ref 13.0–17.0)
Immature Granulocytes: 0 %
Lymphocytes Relative: 38 %
Lymphs Abs: 2.7 10*3/uL (ref 0.7–4.0)
MCH: 33.5 pg (ref 26.0–34.0)
MCHC: 33.7 g/dL (ref 30.0–36.0)
MCV: 99.4 fL (ref 80.0–100.0)
Monocytes Absolute: 0.7 10*3/uL (ref 0.1–1.0)
Monocytes Relative: 10 %
Neutro Abs: 3.6 10*3/uL (ref 1.7–7.7)
Neutrophils Relative %: 50 %
Platelets: 244 10*3/uL (ref 150–400)
RBC: 4.78 MIL/uL (ref 4.22–5.81)
RDW: 12.1 % (ref 11.5–15.5)
WBC: 7.1 10*3/uL (ref 4.0–10.5)
nRBC: 0 % (ref 0.0–0.2)

## 2023-01-17 LAB — SALICYLATE LEVEL: Salicylate Lvl: <7 mg/dL — ABNORMAL LOW (ref 7.0–30.0)

## 2023-01-17 LAB — COMPREHENSIVE METABOLIC PANEL
ALT: 41 U/L (ref 0–44)
AST: 27 U/L (ref 15–41)
Albumin: 3.9 g/dL (ref 3.5–5.0)
Alkaline Phosphatase: 89 U/L (ref 38–126)
Anion gap: 9 (ref 5–15)
BUN: 16 mg/dL (ref 6–20)
CO2: 23 mmol/L (ref 22–32)
Calcium: 8.9 mg/dL (ref 8.9–10.3)
Chloride: 104 mmol/L (ref 98–111)
Creatinine, Ser: 1.14 mg/dL (ref 0.61–1.24)
GFR, Estimated: >60 mL/min (ref >60)
Glucose, Bld: 109 mg/dL — ABNORMAL HIGH (ref 70–99)
Potassium: 3.7 mmol/L (ref 3.5–5.1)
Sodium: 136 mmol/L (ref 135–145)
Total Bilirubin: 0.9 mg/dL (ref 0.3–1.2)
Total Protein: 7.2 g/dL (ref 6.5–8.1)

## 2023-01-17 LAB — ETHANOL: Alcohol, Ethyl (B): <10 mg/dL (ref <10)

## 2023-01-17 LAB — RAPID URINE DRUG SCREEN, HOSP PERFORMED
Amphetamines: POSITIVE — AB
Barbiturates: NOT DETECTED
Benzodiazepines: NOT DETECTED
Cocaine: NOT DETECTED
Opiates: NOT DETECTED
Tetrahydrocannabinol: POSITIVE — AB

## 2023-01-17 LAB — CBG MONITORING, ED: Glucose-Capillary: 112 mg/dL — ABNORMAL HIGH (ref 70–99)

## 2023-01-17 LAB — ACETAMINOPHEN LEVEL: Acetaminophen (Tylenol), Serum: <10 ug/mL — ABNORMAL LOW (ref 10–30)

## 2023-01-17 MED ORDER — LEVETIRACETAM 500 MG PO TABS
1500.0000 mg | ORAL_TABLET | Freq: Two times a day (BID) | ORAL | Status: DC
  Administered 2023-01-17: 1500 mg via ORAL
  Filled 2023-01-17: qty 3

## 2023-01-17 MED ORDER — FOLIC ACID 1 MG PO TABS
1.0000 mg | ORAL_TABLET | Freq: Every day | ORAL | Status: DC
  Administered 2023-01-17: 1 mg via ORAL
  Filled 2023-01-17: qty 1

## 2023-01-17 MED ORDER — LORAZEPAM 2 MG/ML IJ SOLN: 1.0000 mg | INTRAMUSCULAR | Status: DC | PRN

## 2023-01-17 MED ORDER — THIAMINE HCL 100 MG/ML IJ SOLN: 100.0000 mg | Freq: Every day | INTRAMUSCULAR | Status: DC

## 2023-01-17 MED ORDER — LORAZEPAM 1 MG PO TABS
1.0000 mg | ORAL_TABLET | ORAL | Status: DC | PRN
  Administered 2023-01-17: 1 mg via ORAL
  Filled 2023-01-17: qty 1

## 2023-01-17 MED ORDER — LORAZEPAM 2 MG/ML IJ SOLN: 0.0000 mg | Freq: Two times a day (BID) | INTRAMUSCULAR | Status: DC

## 2023-01-17 MED ORDER — LORAZEPAM 1 MG PO TABS: 0.0000 mg | ORAL_TABLET | Freq: Four times a day (QID) | ORAL | Status: DC

## 2023-01-17 MED ORDER — ESCITALOPRAM OXALATE 10 MG PO TABS
10.0000 mg | ORAL_TABLET | Freq: Every day | ORAL | Status: DC
  Administered 2023-01-17: 10 mg via ORAL
  Filled 2023-01-17: qty 1

## 2023-01-17 MED ORDER — ADULT MULTIVITAMIN W/MINERALS CH
1.0000 | ORAL_TABLET | Freq: Every day | ORAL | Status: DC
  Administered 2023-01-17: 1 via ORAL
  Filled 2023-01-17: qty 1

## 2023-01-17 MED ORDER — OLANZAPINE 5 MG PO TBDP: 5.0000 mg | ORAL_TABLET | Freq: Two times a day (BID) | ORAL | Status: DC

## 2023-01-17 MED ORDER — OLANZAPINE 5 MG PO TBDP
5.0000 mg | ORAL_TABLET | Freq: Two times a day (BID) | ORAL | Status: DC
  Administered 2023-01-17: 5 mg via ORAL
  Filled 2023-01-17: qty 1

## 2023-01-17 MED ORDER — HYDROXYZINE HCL 25 MG PO TABS: 25.0000 mg | ORAL_TABLET | Freq: Three times a day (TID) | ORAL | Status: DC | PRN

## 2023-01-17 MED ORDER — TRAZODONE HCL 50 MG PO TABS: 50.0000 mg | ORAL_TABLET | Freq: Every evening | ORAL | Status: DC | PRN

## 2023-01-17 MED ORDER — THIAMINE MONONITRATE 100 MG PO TABS
100.0000 mg | ORAL_TABLET | Freq: Every day | ORAL | Status: DC
  Administered 2023-01-17: 100 mg via ORAL
  Filled 2023-01-17: qty 1

## 2023-01-17 MED ORDER — BICTEGRAVIR-EMTRICITAB-TENOFOV 50-200-25 MG PO TABS: 1.0000 | ORAL_TABLET | Freq: Every day | ORAL | Status: DC

## 2023-01-17 MED ORDER — THIAMINE MONONITRATE 100 MG PO TABS
100.0000 mg | ORAL_TABLET | Freq: Every day | ORAL | Status: DC
  Administered 2023-01-19: 100 mg via ORAL
  Filled 2023-01-17: qty 1

## 2023-01-17 MED ORDER — ESCITALOPRAM OXALATE 10 MG PO TABS: 10.0000 mg | ORAL_TABLET | Freq: Every day | ORAL | Status: DC

## 2023-01-17 MED ORDER — BICTEGRAVIR-EMTRICITAB-TENOFOV 50-200-25 MG PO TABS
1.0000 | ORAL_TABLET | Freq: Every day | ORAL | Status: DC
  Administered 2023-01-17: 1 via ORAL
  Filled 2023-01-17: qty 1

## 2023-01-17 MED ORDER — POTASSIUM CHLORIDE CRYS ER 20 MEQ PO TBCR
40.0000 meq | EXTENDED_RELEASE_TABLET | Freq: Once | ORAL | Status: AC
  Administered 2023-01-17: 40 meq via ORAL
  Filled 2023-01-17: qty 2

## 2023-01-17 MED ORDER — LORAZEPAM 2 MG/ML IJ SOLN: 0.0000 mg | Freq: Four times a day (QID) | INTRAMUSCULAR | Status: DC

## 2023-01-17 MED ORDER — TRAZODONE HCL 100 MG PO TABS: 50.0000 mg | ORAL_TABLET | Freq: Every evening | ORAL | Status: DC | PRN

## 2023-01-17 MED ORDER — LORAZEPAM 1 MG PO TABS: 0.0000 mg | ORAL_TABLET | Freq: Two times a day (BID) | ORAL | Status: DC

## 2023-01-17 NOTE — Discharge Instructions (Signed)
Transfer patient to [facility] emergency department, Dr. Jarold Motto is the accepting MD.    Patient is recommended for inpatient psychiatric admission.  He cannot be held at [facility] while awaiting bed availability due to having a witnessed seizure earlier today while in the [facility] emergency department.  Policy at [facility] is patient cannot have had a convulsive event within the past 72 hours.

## 2023-01-17 NOTE — Progress Notes (Signed)
TOC consulted for substance abuse resources. Pt is under TTS care at this time. Resources attached to AVS. TOC signing off.

## 2023-01-17 NOTE — Consult Note (Signed)
Attempted to see patient for psychiatric assessment via tts cart; Per Buck Mam, RN caring for patient, 'He is super drowsy at the moment. I don't think he'll be able to participate."  Per chart review,  patient had a 45 second seizure around 7am this morning; he has been medically cleared but appears he's still recovering from the event. This Clinical research associate recognize sleep is therapeutic; Psychiatry will continue to reach out to see when patient is up to assessment.

## 2023-01-17 NOTE — ED Notes (Signed)
Security at bedside to take patient off of hospital property.

## 2023-01-17 NOTE — ED Notes (Signed)
Pt is awaiting for IVC to be served and him transferred to [place] due to seizures

## 2023-01-17 NOTE — ED Triage Notes (Signed)
Pt arrived from [facility] via [facility] d/t sz pt had while at [facility]. Pt was medicated at [facility] with keppra. Pt endorses that he is suicidal with a plan.

## 2023-01-17 NOTE — ED Notes (Signed)
Pt cognition back to baseline. Pt has no recollection of pulling IV out or previous events.

## 2023-01-17 NOTE — ED Provider Notes (Signed)
[place] EMERGENCY DEPARTMENT AT [facility] Provider Note   CSN: [id number] Arrival date & time: 01/17/23  2037     History  Chief Complaint  Patient presents with   Seizures   Suicidal    Justin [name] is a 42 y.o. male with a past medical history significant for HIV, chronic hepatitis C, amphetamine abuse, history of seizures, and depression who presents to the ED from [facility] for medical clearance.  Patient admits to suicidal ideations and has plans to overdose on pills.  Patient was seen at [facility] yesterday where it was recommended to be admitted to inpatient psychiatry however, patient was discharged and went to [facility] early this morning.  While at [facility] ED it appears patient had a tonic-clonic seizure around 7 AM this morning.  Patient was given Keppra at [facility] and while at [facility].  No seizures since.  Patient is unclear whether or not he has been compliant with his Keppra.  Denies chronic alcohol use.  Notes he was recently out of town and drank more than he normally does.  No history of acute alcohol withdrawal.  Denies tremors.  Admits to some hallucinations.  Denies HI.  Admits to amphetamine use.  No physical complaints. Patient sent from [facility] for medical clearance.   History obtained from patient and past medical records. No interpreter used during encounter.       Home Medications Prior to Admission medications   Medication Sig Start Date End Date Taking? Authorizing Provider  bictegravir-emtricitabine-tenofovir AF (BIKTARVY) 50-200-25 MG TABS tablet Take 1 tablet by mouth daily. 01/13/23   Lewanda Rife, MD  escitalopram (LEXAPRO) 10 MG tablet Take 1 tablet (10 mg total) by mouth daily. 01/12/23   Lewanda Rife, MD  hydrOXYzine (ATARAX) 25 MG tablet Take 1 tablet (25 mg total) by mouth 3 (three) times daily as needed for anxiety. 01/12/23   Lewanda Rife, MD  levETIRAcetam (KEPPRA) 750 MG tablet Take 2 tablets (1,500 mg total) by mouth 2  (two) times daily. 01/12/23   Lewanda Rife, MD  OLANZapine zydis (ZYPREXA) 5 MG disintegrating tablet Take 1 tablet (5 mg total) by mouth 2 (two) times daily. 01/12/23   Lewanda Rife, MD  traZODone (DESYREL) 50 MG tablet Take 1 tablet (50 mg total) by mouth at bedtime as needed for sleep. 01/12/23   Lewanda Rife, MD      Allergies    Patient has no known allergies.    Review of Systems   Review of Systems  Neurological:  Positive for seizures.  Psychiatric/Behavioral:  Positive for suicidal ideas.     Physical Exam Updated Vital Signs BP 131/83 (BP Location: Right Arm)   Pulse 94   Temp 98.9 F (37.2 C) (Oral)   Resp 16   SpO2 94%  Physical Exam Vitals and nursing note reviewed.  Constitutional:      General: He is not in acute distress.    Appearance: He is not ill-appearing.  HENT:     Head: Normocephalic.  Eyes:     Pupils: Pupils are equal, round, and reactive to light.  Cardiovascular:     Rate and Rhythm: Normal rate and regular rhythm.     Pulses: Normal pulses.     Heart sounds: Normal heart sounds. No murmur heard.    No friction rub. No gallop.  Pulmonary:     Effort: Pulmonary effort is normal.     Breath sounds: Normal breath sounds.  Abdominal:     General:  Abdomen is flat. There is no distension.     Palpations: Abdomen is soft.     Tenderness: There is no abdominal tenderness. There is no guarding or rebound.  Musculoskeletal:        General: Normal range of motion.     Cervical back: Neck supple.  Skin:    General: Skin is warm and dry.  Neurological:     General: No focal deficit present.     Mental Status: He is alert.  Psychiatric:        Mood and Affect: Mood normal.        Behavior: Behavior normal.     ED Results / Procedures / Treatments   Labs (all labs ordered are listed, but only abnormal results are displayed) Labs Reviewed  COMPREHENSIVE METABOLIC PANEL - Abnormal; Notable for the following components:      Result  Value   Glucose, Bld 109 (*)    All other components within normal limits  ACETAMINOPHEN LEVEL - Abnormal; Notable for the following components:   Acetaminophen (Tylenol), Serum <10 (*)    All other components within normal limits  SALICYLATE LEVEL - Abnormal; Notable for the following components:   Salicylate Lvl <7.0 (*)    All other components within normal limits  ETHANOL  CBC WITH DIFFERENTIAL/PLATELET  RAPID URINE DRUG SCREEN, HOSP PERFORMED    EKG None  Radiology No results found.  Procedures Procedures    Medications Ordered in ED Medications  LORazepam (ATIVAN) injection 0-4 mg (has no administration in time range)    Or  LORazepam (ATIVAN) tablet 0-4 mg (has no administration in time range)  LORazepam (ATIVAN) injection 0-4 mg (has no administration in time range)    Or  LORazepam (ATIVAN) tablet 0-4 mg (has no administration in time range)  thiamine (VITAMIN B1) tablet 100 mg (has no administration in time range)    Or  thiamine (VITAMIN B1) injection 100 mg (has no administration in time range)    ED Course/ Medical Decision Making/ A&P                             Medical Decision Making Amount and/or Complexity of Data Reviewed External Data Reviewed: notes. Labs: ordered. Decision-making details documented in ED Course. ECG/medicine tests: ordered and independent interpretation performed. Decision-making details documented in ED Course.  Risk OTC drugs. Prescription drug management.   This patient presents to the ED for concern of SI, this involves an extensive number of treatment options, and is a complaint that carries with it a high risk of complications and morbidity.  The differential diagnosis includes psychosis, electrolyte abnormality, infection, etc  Patient presents from [facility] for medical clearance due to suicidal ideations. He is recommended for inpatient psychiatric admission. Had a seizure earlier today. Unclear whether or not patient  has been compliant with Keppra. Patient given 1500mg  keppra prior to arrival and earlier today. Denies chronic alcohol abuse, but has been drinking more than normal over the past few days. CIWA protocol in place. Medical clearance labs ordered.   CBC unremarkable.  Leukocytosis.  Normal hemoglobin.  Ethanol, acetaminophen, salicylate levels within normal limits.  CMP reassuring.  Normal renal function. No major electrolyte derangements.  Patient has been medically cleared for TTS evaluation.  No further seizures since this morning after being given Keppra both at [facility] and at [facility]. Unclear whether or not patient has been compliant with his medication.  Patient was  IVC'd at [facility] due to SI.  The patient has been placed in psychiatric observation due to the need to provide a safe environment for the patient while obtaining psychiatric consultation and evaluation, as well as ongoing medical and medication management to treat the patient's condition.  The patient has been placed under full IVC at this time.  Homeless Hx polysubstance abuse and depression       Final Clinical Impression(s) / ED Diagnoses Final diagnoses:  Suicidal ideation    Rx / DC Orders ED Discharge Orders     None         Jesusita Oka 01/17/23 2248    Benjiman Core, MD 01/17/23 [phone]

## 2023-01-17 NOTE — Progress Notes (Signed)
LCSW Progress Note  [id number]   Justin [name]  01/17/2023  6:12 PM    Inpatient Behavioral Health Placement  Pt meets inpatient criteria per [facility]. There are no available beds within [facility] system per [facility] Danika Riley,RN. Referral was sent to the following facilities;   Destination  Service Provider Address Phone [facility]  [address] [place], [place] [address] [phone]  [facility]  [address] [place] [address] [phone]  [facility] [address]  [address], [place] [address] [phone]  [facility] [address] [place]  [address], [place] [address] [phone]  [facility]  [address] [place] [address] [phone]  [facility]  [address] [place] [address] [phone]  [facility]  [address] [place], [place] [address] [phone]  [facility]  [address] [place] [address] [phone]  [facility]  [address] [place] [address] [phone]  [facility]  [address] [place] [address] [phone]  [facility]  [address], [place] [address] [phone]  [facility]  [address] [place] [address] [phone]  [facility]  [address] [place] [address] [phone]  [facility]  [address], [place] [address] [phone]  [facility]  [address], [place] [address] [phone]  [facility]  [address] [place],  [place] [address] [phone]  [facility]  [address], [place] [address] [phone]  [facility]  [address], [place] [address] [phone]  [facility]  [address] [place] [address] [phone]  [facility]  [address], [place] [address] [phone]  [facility]  [address] [place] [address] [phone]    Situation ongoing,  CSW will follow up.    [name], MSW, [facility] 01/17/2023 6:12 PM

## 2023-01-17 NOTE — ED Provider Notes (Signed)
Called to see patient due to witnessed seizure activity by nursing staff.  Patient had received Keppra.  Patient does drink alcohol.  Placed on CIWA protocol.  Awaiting psychiatric assessment   [name], MD 01/17/23 [phone]

## 2023-01-17 NOTE — ED Notes (Addendum)
Pt noted to have had a 45 sec seizure. CBG-112. Seizure pads in place, Pt remains A&O x3(name/dob/place).

## 2023-01-17 NOTE — Progress Notes (Signed)
LCSW Progress Note  [id number]   JARL SELLITTO  01/17/2023  11:54 AM  Description:   Inpatient Psychiatric Referral  Patient was recommended inpatient per Ophelia Shoulder, NP. There are no available beds at [facility], per [facility] Rona Ravens, RN. Patient was referred to the following out of network facilities:   [facility] Provider Address Phone Fax  [facility]  [address] [place] [address] [phone]  [facility]  [address] [place] [address] [phone]  [facility]  [address], [place] [address] [phone]  [facility]  [address] [place], [place] [address] [phone]  [facility] [address] [place]  [address], [place] [address] [phone]  [facility]  [address] [place], [place] [address] [phone]  [facility] [address] [place] [address] [phone]  [facility]  [address] [place] [place] [address] [phone]  [facility]  [address] [place], [place] [address] [phone]  [facility]  [address] [place] [address] [phone]  [facility]  [address], [place] [address] [phone]  [facility]  [address], [place] [address] [phone]  [facility]  [address] [place] [address] [phone]  [facility]  [address], [place] [address] [phone]  [facility]  [address], [place] [address] [phone]  [facility]  [address] [place] [address] [phone]  [facility]  [address] [place], [place] [address] [phone]  [facility]  [address] [place] [address] [phone]  [facility]  [address] [place] [address] [phone]  [facility]  [address] [place] [address] [phone]  [facility]  [address] [place] [address] [phone]  [facility]  [address] [place] [address] [phone]  [facility]  [address] [place] [address] [phone]  [facility]  [address] [place] [address] [phone]  [facility]  [address] [place] [address] [phone]  [facility]  [address] [place] [address] [phone]    Situation ongoing, CSW to continue following and update chart as more information becomes available.      Cathie Beams, LCSW  01/17/2023 11:54 AM

## 2023-01-17 NOTE — ED Notes (Addendum)
Pt witnessed having 30-45 second seizure. PA notified.
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
## 2023-01-17 NOTE — Progress Notes (Signed)
   01/17/23 1613  [facility] Triage Screening (Walk-ins at [facility] only)  How Did You Hear About [place]? Family/Friend  What Is the Reason for Your Visit/Call Today? Justin [name] is a 42 year old male who presents voluntarily to accompanied by a friend due to passive SI . Pt reports he is homeless. Pt states "I'm tired of living". Pt reports recent seizures. Pt denies therapy/psychiatry services. Pt reports paranoia feeling like he is being followed. Pt reports hearing voices teling him to keep his hands to himself. Pt reports last using meth 3 days ago. Pt denies HI.  How Long Has This Been Causing You Problems? <Week  Have You Recently Had Any Thoughts About Hurting Yourself? Yes  How long ago did you have thoughts about hurting yourself? TODAY  Are You Planning to Commit Suicide/Harm Yourself At This time? No  Have you Recently Had Thoughts About Hurting Someone [name]? No  Are You Planning To Harm Someone At This Time? No  Explanation: hearing voices teling him to keep his hands to himself.  Are you currently experiencing any auditory, visual or other hallucinations? Yes  Have You Used Any Alcohol or Drugs in the Past 24 Hours? No  Do you have any current medical co-morbidities that require immediate attention? No  Clinician description of patient physical appearance/behavior: tearful, depressed mood with congruent affect  What Do You Feel Would Help You the Most Today? Treatment for Depression or other mood problem  If access to [facility] was not available, would you have sought care in the Emergency Department? No  Determination of Need Urgent (48 hours)  Options For Referral Inpatient Hospitalization

## 2023-01-17 NOTE — ED Provider Notes (Signed)
Pt reassessed.  Pt is alert, oriented. Pt denies any current complaints.  No headache. No neck pain. No chest pain or sob. No abd pain or nv.  Vitals are normal. Pt breathing comfortably. Pt with normal mood and affect. Pt does not appear acutely depressed or despondent. No thoughts/plan of harm to self or others. Pt does acknowledge stress related to housing instability and substance use.  Pt does not appear to be responding to internal stimuli - no delusions or hallucinations noted.   Pt with normal appetite. Has eaten full meal tray and asks for something else to eat/drink. Snack and drink provided.   Pt indicates has adequate of his seizure meds.    Pt currently appears stable for ED discharge.    Rec outpatient pcp/neurology follow up - referral for neurology f/u provided. Also recommend pursuing substance use treatment program - resource guide provided in terms of accesssing SUD tx program, as well as other social service resources in the area.   Return precautions provided.     Cathren Laine, MD 01/17/23 [phone]

## 2023-01-17 NOTE — ED Provider Notes (Signed)
[facility] Medical Screening Exam  Patient Name: Justin [name] MRN: [id number] Date of Evaluation: 01/17/23 Chief Complaint:  Increased depression and SI Diagnosis:  Final diagnoses:  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HCC)    History of Present illness: Justin [name] is a 42 y.o. male patient presented to [facility] as a walk in  accompanied by his friend with complaints of increased depression and SI  Justin [name], 42 y.o., male patient seen face to face by this provider, consulted with Dr. Lucianne Muss; and chart reviewed on 01/17/23.    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  He has a medical history significant for hepatitis C, HIV, and seizure disorder.  He has an upcoming court date on July 29 due to trespassing. He is currently unemployed and homeless.   Patient presented to [facility] emergency department on 01/16/2023 for seizures.  At that time he endorsed SI and TTS consult was placed.  Recommendation was made for inpatient psychiatric admission.  There was no bed availability at [facility] and patient was referred/faxed out to other facilities.  He remains voluntary at that time.  While patient was in the emergency department it is charted that he had a witnessed 30-45-second seizure.  Patient was evaluated by emergency room physician. He was medically cleared at that time it was noted by the EDP that patient was denying any thoughts/plans to harm self or others.  Patient was discharged.  Patient immediately presented to [facility] for assessment endorsing increased depression, and suicidal  ideations with a plan.   During evaluation Justin [name] is observed sitting in the assessment area in no acute distress.  He is casually dressed and makes fair eye contact.  He is alert/oriented x 4, cooperative, and attentive.  He is endorsing depression with feelings of helplessness, hopelessness, irritability, tearfulness, decreased appetite and sleep.  He has a depressed affect.  He appears anxious and takes deep breaths throughout the assessment.  He is tearful at times.  He identifies his stressors as lack of housing, family discord and being unable to work.  States he was employed but due to his seizures his employer has told him not to come back until he has his seizures under control.  States he has several brothers and sisters who lives with his mother but his mother refuses to help him.  He identifies no support.  He was recently hospitalized and he has not followed up with outpatient resources.  Reports he has gone a few days without eating or sleeping.  Last night he was able to get a few hours of sleep.  He is endorsing suicidal ideations with a plan to overdose or shoot himself with a firearm.  He alleges to have access to a firearm.  He is denying any visual hallucinations.  He is endorsing auditory hallucinations of hearing voices that tell him to hurt himself, they also tell him not to hurt others.  He denies having homicidal ideations but states due to his paranoia he believes that people are trying to hurt him and are after him.  He does not appear psychotic or manic.  He does not appear to be responding to internal/external stimuli.  He is  able to answer questions appropriately.  Patient reports he has used methamphetamines in the past but states he has not used for the past 7 months.   Discussed inpatient psychiatric admission with patient and he is agreeable.  Discussed with patient involuntary commitment process due to suicidal ideations, patient verbalizes understanding.        Flowsheet Row ED from 01/17/2023 in [facility] ED from 01/16/2023 in [facility] Emergency Department at [facility] Admission (Discharged) from 01/06/2023 in [facility] INPATIENT BEHAVIORAL MEDICINE  C-SSRS RISK CATEGORY High Risk High Risk High Risk       Psychiatric Specialty Exam  Presentation  General Appearance:Casual  Eye Contact:Fair  Speech:Clear and Coherent; Normal Rate  Speech Volume:Normal  Handedness:Right   Mood and Affect  Mood: Anxious; Depressed; Dysphoric  Affect: Congruent   Thought Process  Thought Processes: Coherent  Descriptions of Associations:Intact  Orientation:Full (Time, Place and Person)  Thought Content:Paranoid Ideation  Diagnosis of Schizophrenia or Schizoaffective disorder in past: -- (reports he has been diagnosed with schizophrenia)  Duration of Psychotic Symptoms: Greater than six months  Hallucinations:Auditory hears voices that tell him to kill self, also not to hurt self or others  Ideas of Reference:Paranoia  Suicidal Thoughts:Yes, Active With Intent; With Plan; With Means to Carry Out Without Plan  Homicidal Thoughts:Yes, Passive Without Intent; Without Plan; Without Means to Carry Out   Sensorium  Memory: Immediate Good; Recent Good; Remote Good  Judgment: Poor  Insight: Fair   Art therapist  Concentration: Good  Attention Span: Good  Recall: Good  Fund of Knowledge: Good  Language: Good   Psychomotor Activity  Psychomotor Activity: Normal   Assets  Assets: Communication Skills; Desire for Improvement; Physical Health; Resilience; Social Support   Sleep  Sleep: Poor  Number of hours:  3   Physical Exam: Physical Exam Vitals and nursing note reviewed.  Constitutional:      General: He is not in acute distress.    Appearance: He is well-developed. He is not ill-appearing.  Eyes:     General:        Right eye: No discharge.         Left eye: No discharge.  Cardiovascular:     Rate and Rhythm: Normal rate.  Pulmonary:     Effort: Pulmonary effort is normal. No respiratory distress.  Musculoskeletal:        General: No swelling. Normal range of motion.     Cervical back: Normal range of motion.  Skin:    Coloration: Skin is not jaundiced or pale.  Neurological:     Mental Status: He is alert and oriented to person, place, and time.  Psychiatric:        Attention and Perception: Attention normal. He perceives auditory hallucinations.        Mood and Affect: Mood is anxious and depressed.        Speech: Speech normal.        Behavior: Behavior is cooperative.        Thought Content: Thought content is paranoid. Thought content includes homicidal and suicidal ideation. Thought content includes suicidal plan. Thought content does not include homicidal plan.        Cognition and Memory: Cognition normal.        Judgment: Judgment is impulsive.    Review of Systems  Constitutional: Negative.   HENT: Negative.    Eyes: Negative.   Respiratory: Negative.    Cardiovascular: Negative.   Musculoskeletal: Negative.  Skin: Negative.   Neurological: Negative.  Negative for dizziness, tingling, sensory change, speech change, seizures and headaches.  Psychiatric/Behavioral:  Positive for depression, hallucinations and suicidal ideas. The patient is nervous/anxious.    Blood pressure 132/78, pulse 98, temperature 98.8 F (37.1 C), temperature source Oral, resp. rate 18, SpO2 99%. There is no height or weight on file to calculate BMI.  Musculoskeletal: Strength & Muscle Tone: within normal limits Gait & Station: normal Patient leans: N/A   [facility] MSE Discharge Disposition for Follow up and Recommendations: Based on my evaluation I certify that psychiatric inpatient services furnished can reasonably be expected to improve the patient's condition which I recommend transfer to an appropriate accepting facility.   Patient  is recommended for inpatient psychiatric admission.  Patient will be transferred to the [facility] emergency department Dr. Eloise Harman is the accepting MD. Patient cannot be held at the [facility]-policy states patient cannot provide a convulsive  event within 72 hours.  At EDP request patient placed under involuntary commitment due to SI and possible elopement risk.  Affidavit for commitment and first exam completed.  IVC will expire on 01/24/2023 at 6:33 PM.  [employer] will be contacted to serve and transport patient to [facility] ED.  [facility] notified and there is no bed availability.  Social work notified and patient has been faxed out.  Medications: Patient's reported medications prescribed by infectious disease. Biktarvy 50 - 200 - 25 milligram tablet daily-HIV Keppra 1500 mg twice daily (states he takes at 7am and 7pm)-seizure disorder- Asked nursing to give the 7 pm dose before transfer. Please verify.  Lexapro 10 mg daily -depression Hydroxyzine 25 mg 3 times daily as needed for anxiety Trazodone 50 mg nightly as needed for sleep Zyprexa 5 mg twice daily-depression and psychotic features   Ardis Hughs, NP 01/17/2023, 6:48 PM

## 2023-01-17 NOTE — Discharge Instructions (Addendum)
It was our pleasure to provide your ER care today - we hope that you feel better.  Avoid drug use as it is harmful to your physical health and mental well-being. See resource guide attached in terms of accessing inpatient or outpatient substance use treatment programs.  Also see additional resource guides attached in terms of accessing shelter and other social services in the area.   Follow up closely with primary care doctor and behavioral health provider in the coming week.   For seizures, make sure to take your meds as prescribed, and not to miss or skip any doses.  Also important to avoid any drug use. Follow up closely with neurologist in the coming week - call office tomorrow AM to arrange appointment time.  No driving, swimming, or operating heavy machinery until clear to do so by your doctor.   For mental health issues and/or crisis, you may also go directly to the [facility] - they are open 24/7 and walk-ins are welcome.    From your lab tests, your potassium level is mildly low - eat plenty of fruits and vegetables, and follow up with primary care doctor in 1-2 weeks.   Return to ER if worse, new symptoms, fevers, chest pain, trouble breathing, or other emergency concern.

## 2023-01-17 NOTE — ED Notes (Signed)
Pt changed into Purple scrubs and wanded with Clinical research associate and cindy present

## 2023-01-18 ENCOUNTER — Encounter (HOSPITAL_COMMUNITY): Payer: Self-pay | Admitting: Psychiatry

## 2023-01-18 ENCOUNTER — Other Ambulatory Visit: Payer: Self-pay | Admitting: Internal Medicine

## 2023-01-18 DIAGNOSIS — F332 Major depressive disorder, recurrent severe without psychotic features: Secondary | ICD-10-CM | POA: Diagnosis not present

## 2023-01-18 DIAGNOSIS — F1520 Other stimulant dependence, uncomplicated: Secondary | ICD-10-CM | POA: Diagnosis not present

## 2023-01-18 MED ORDER — HYDROXYZINE HCL 25 MG PO TABS
25.0000 mg | ORAL_TABLET | Freq: Three times a day (TID) | ORAL | Status: DC | PRN
  Administered 2023-01-18: 25 mg via ORAL
  Filled 2023-01-18: qty 1

## 2023-01-18 MED ORDER — BICTEGRAVIR-EMTRICITAB-TENOFOV 50-200-25 MG PO TABS
1.0000 | ORAL_TABLET | Freq: Every day | ORAL | Status: DC
  Administered 2023-01-18 – 2023-01-19 (×2): 1 via ORAL
  Filled 2023-01-18 (×2): qty 1

## 2023-01-18 MED ORDER — ESCITALOPRAM OXALATE 10 MG PO TABS
10.0000 mg | ORAL_TABLET | Freq: Every day | ORAL | Status: DC
  Administered 2023-01-18 – 2023-01-19 (×2): 10 mg via ORAL
  Filled 2023-01-18 (×2): qty 1

## 2023-01-18 MED ORDER — LEVETIRACETAM 750 MG PO TABS
1500.0000 mg | ORAL_TABLET | Freq: Two times a day (BID) | ORAL | Status: DC
  Administered 2023-01-18 – 2023-01-19 (×3): 1500 mg via ORAL
  Filled 2023-01-18 (×3): qty 2

## 2023-01-18 MED ORDER — ACETAMINOPHEN 325 MG PO TABS: 650.0000 mg | ORAL_TABLET | Freq: Four times a day (QID) | ORAL | Status: DC | PRN

## 2023-01-18 MED ORDER — OLANZAPINE 5 MG PO TBDP
5.0000 mg | ORAL_TABLET | Freq: Every day | ORAL | Status: DC
  Administered 2023-01-18: 5 mg via ORAL
  Filled 2023-01-18: qty 1

## 2023-01-18 NOTE — ED Provider Notes (Signed)
Emergency Medicine Observation Re-evaluation Note  Justin [name] is a 42 y.o. male, seen on rounds today.  Pt initially presented to the ED for complaints of Seizures and Suicidal Currently, the patient is laying in bed.  Physical Exam  BP 106/88 (BP Location: Left Arm)   Pulse 79   Temp 98.8 F (37.1 C) (Oral)   Resp 16   SpO2 100%  Physical Exam General: Awake, alert, nondistressed Cardiac: Extremities well-perfused Lungs: Unlabored breathing Psych: No agitation  ED Course / MDM  EKG:   I have reviewed the labs performed to date as well as medications administered while in observation.  Recent changes in the last 24 hours include presentation to the emergency department yesterday from [facility] for medical clearance in the setting of suicidal ideation with plan.  He has been medically cleared.  He is under IVC.  He is awaiting TTS evaluation.  Plan  Current plan is for TTS evaluation.    Gloris Manchester, MD 01/18/23 [phone]
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
## 2023-01-18 NOTE — Progress Notes (Addendum)
Pt was accepted to [facility] 01/19/2023. Bed assignment: [place] unit  Pt meets inpatient criteria per Arsenio Loader, NP  Attending Physician will be Joylene Igo, AGPCNP  Report can be called to: [phone] (please wait to call report until after transport has arrived)  Pt can arrive after 8 AM  Care Team Notified: Arsenio Loader, NP and Denton Ar, RN  Cathie Beams, LCSW  01/18/2023 11:25 AM

## 2023-01-18 NOTE — ED Notes (Signed)
Belonging located in locker 1

## 2023-01-18 NOTE — ED Notes (Signed)
Pt calling friend to bring in his home biktarvy.

## 2023-01-18 NOTE — ED Notes (Signed)
Pt brought in [place] from home meds to go w/ him to inpatient psychiatric hospital tomorrow. Biktarvy inventoried and placed in main pharmacy.

## 2023-01-18 NOTE — Progress Notes (Signed)
LCSW Progress Note  [id number]   Justin [name]  01/18/2023  10:49 AM  Description:   Inpatient Psychiatric Referral  Patient was recommended inpatient per Arsenio Loader, NP. There are no available beds at [facility], per [facility] Rona Ravens, RN. Patient was referred to the following out of network facilities:   [facility] Provider Address Phone Fax  [facility]  [address], [place] [address] [phone]  [facility]  [address] [place] [address] [phone]  [facility]  [address] [place], [place] [address] [phone]  [facility]  [address] [place] [address] [phone]  [facility]  [address] [place] [address] [phone]  [facility]  [address] [place] [address] [phone]  [facility]  [address] [place], [place] [address] [phone]  [facility]  [address] [place] [address] [phone]  [facility]  [address] [place] [address] [phone]  [facility]  [address] [place] [address] [phone]  [facility]  [address], [place] [address] [phone]  [facility]  [address], [place] [address] [phone]  [facility]  [address], [place] [address] [phone]  [facility]  [address], [place] [address] [phone]  [facility]  [address] [place] [address] [phone]  [facility]  [address]  [place] [address] [phone]  [facility]  [address], [place] [address] [phone]  [facility]  [address], [place] [address] [phone]  [facility]  [address] [place], [place] [address] [phone]  [facility]  [address] [place] [address] [phone]  [facility]  [address], [place] [address] [phone]  [facility]  [address] [place] [address] [phone]  [facility]  [address], [place] [address] [phone]  [facility]  [address], [place] [address] [phone]  [facility]  [address] [place], [place] [address] [phone]  [facility]  [address] [place] [address] [phone]  [facility]  [address], [place] [address] [phone]    Situation ongoing, CSW to continue following and update chart as more information becomes available.      Cathie Beams, [place]  01/18/2023 10:49 AM

## 2023-01-19 LAB — LEVETIRACETAM LEVEL: Levetiracetam Lvl: 65.4 ug/mL — ABNORMAL HIGH (ref 10.0–40.0)

## 2023-01-19 NOTE — ED Provider Notes (Signed)
Emergency Medicine Observation Re-evaluation Note  Justin [name] is a 42 y.o. male, seen on rounds today.  Pt initially presented to the ED for complaints of Seizures and Suicidal Currently, the patient is laying in bed.  Physical Exam  BP 113/78   Pulse 85   Temp 98.5 F (36.9 C) (Oral)   Resp 18   SpO2 91%  Physical Exam General: resting comfortably Cardiac: Extremities well-perfused Lungs: Unlabored breathing Psych: No agitation  ED Course / MDM  EKG:EKG Interpretation Date/Time:  Tuesday January 17 2023 21:26:50 EDT Ventricular Rate:  77 PR Interval:  138 QRS Duration:  92 QT Interval:  356 QTC Calculation: 402 R Axis:   -25  Text Interpretation: Normal sinus rhythm Minimal voltage criteria for LVH, may be normal variant ( R in aVL ) Borderline ECG When compared with ECG of 01/16/2023, No significant change was found Confirmed by Dione Booze ([id number]) on 01/19/2023 2:54:02 AM  I have reviewed the labs performed to date as well as medications administered while in observation.  Recent changes in the last 24 hours include presentation to the emergency department yesterday from [facility] for medical clearance in the setting of suicidal ideation with plan.  He has been medically cleared.  He is under IVC.  He is awaiting inpatient psychiatric placement.  Plan  Inpatient psychiatric placement      Melene Plan, DO 01/19/23 0800

## 2023-01-19 NOTE — ED Provider Notes (Addendum)
Pt accepted to [facility], Dr Jerel Shepherd.  Pt alert, awake, no distress. Vitals normal.  Pt appears stable for transfer/transport.       Cathren Laine, MD 01/19/23 1000

## 2023-01-19 NOTE — ED Notes (Signed)
IVC'd 7/16, exp 7/23

## 2023-01-19 NOTE — ED Notes (Signed)
[facility] called

## 2023-01-30 ENCOUNTER — Other Ambulatory Visit: Payer: Self-pay

## 2023-01-30 ENCOUNTER — Telehealth: Payer: Self-pay

## 2023-01-30 MED ORDER — BIKTARVY 50-200-25 MG PO TABS: 1.0000 | ORAL_TABLET | Freq: Every day | ORAL | 0 refills | Status: DC

## 2023-01-30 NOTE — Telephone Encounter (Signed)
Patient calling requesting a refill on his Keppra. Ok to refill?

## 2023-01-31 ENCOUNTER — Other Ambulatory Visit: Payer: Self-pay

## 2023-01-31 MED ORDER — LEVETIRACETAM 750 MG PO TABS: 1500.0000 mg | ORAL_TABLET | Freq: Two times a day (BID) | ORAL | 0 refills | Status: DC

## 2023-01-31 NOTE — Telephone Encounter (Signed)
Rx sent 

## 2023-02-03 ENCOUNTER — Other Ambulatory Visit: Payer: Self-pay

## 2023-02-03 ENCOUNTER — Emergency Department (HOSPITAL_COMMUNITY)
Admission: EM | Admit: 2023-02-03 | Discharge: 2023-02-03 | Disposition: A | Discharge status: Home / Self Care | Payer: Self-pay | Attending: Emergency Medicine | Admitting: Emergency Medicine

## 2023-02-03 ENCOUNTER — Encounter (HOSPITAL_COMMUNITY): Payer: Self-pay

## 2023-02-03 DIAGNOSIS — Z4800 Encounter for change or removal of nonsurgical wound dressing: Secondary | ICD-10-CM | POA: Insufficient documentation

## 2023-02-03 DIAGNOSIS — Z5189 Encounter for other specified aftercare: Secondary | ICD-10-CM

## 2023-02-03 DIAGNOSIS — Z21 Asymptomatic human immunodeficiency virus [HIV] infection status: Secondary | ICD-10-CM | POA: Insufficient documentation

## 2023-02-03 DIAGNOSIS — R42 Dizziness and giddiness: Secondary | ICD-10-CM | POA: Insufficient documentation
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
## 2023-02-03 NOTE — ED Triage Notes (Signed)
Pt coming in today concerned that a wound on his bottom is infected. Some discharge noted from wound. Pt also complaining of dizziness related to seizure, concerned that he may have a seizure.

## 2023-02-08 ENCOUNTER — Other Ambulatory Visit: Payer: Self-pay

## 2023-02-08 ENCOUNTER — Emergency Department (HOSPITAL_COMMUNITY)
Admission: EM | Admit: 2023-02-08 | Discharge: 2023-02-10 | Disposition: A | Discharge status: Other Acute Inpatient Hospital | Payer: 59 | Attending: Emergency Medicine | Admitting: Emergency Medicine

## 2023-02-08 ENCOUNTER — Encounter (HOSPITAL_COMMUNITY): Payer: Self-pay | Admitting: Emergency Medicine

## 2023-02-08 DIAGNOSIS — F32A Depression, unspecified: Secondary | ICD-10-CM | POA: Diagnosis not present

## 2023-02-08 DIAGNOSIS — Z1152 Encounter for screening for COVID-19: Secondary | ICD-10-CM | POA: Diagnosis not present

## 2023-02-08 DIAGNOSIS — R45851 Suicidal ideations: Secondary | ICD-10-CM | POA: Diagnosis not present

## 2023-02-08 DIAGNOSIS — R443 Hallucinations, unspecified: Secondary | ICD-10-CM | POA: Diagnosis present

## 2023-02-08 LAB — COMPREHENSIVE METABOLIC PANEL
ALT: 31 U/L (ref 0–44)
AST: 27 U/L (ref 15–41)
Albumin: 4 g/dL (ref 3.5–5.0)
Alkaline Phosphatase: 85 U/L (ref 38–126)
Anion gap: 13 (ref 5–15)
BUN: 12 mg/dL (ref 6–20)
CO2: 20 mmol/L — ABNORMAL LOW (ref 22–32)
Calcium: 9 mg/dL (ref 8.9–10.3)
Chloride: 107 mmol/L (ref 98–111)
Creatinine, Ser: 1.05 mg/dL (ref 0.61–1.24)
GFR, Estimated: >60 mL/min (ref >60)
Glucose, Bld: 80 mg/dL (ref 70–99)
Potassium: 3.8 mmol/L (ref 3.5–5.1)
Sodium: 140 mmol/L (ref 135–145)
Total Bilirubin: 0.7 mg/dL (ref 0.3–1.2)
Total Protein: 7.1 g/dL (ref 6.5–8.1)

## 2023-02-08 MED ORDER — LEVETIRACETAM 500 MG PO TABS
750.0000 mg | ORAL_TABLET | Freq: Once | ORAL | Status: AC
  Administered 2023-02-08: 750 mg via ORAL
  Filled 2023-02-08: qty 1

## 2023-02-08 MED ORDER — HYDROXYZINE HCL 25 MG PO TABS
25.0000 mg | ORAL_TABLET | Freq: Three times a day (TID) | ORAL | Status: DC | PRN
  Administered 2023-02-09 – 2023-02-10 (×3): 25 mg via ORAL
  Filled 2023-02-08 (×3): qty 1

## 2023-02-08 MED ORDER — BICTEGRAVIR-EMTRICITAB-TENOFOV 50-200-25 MG PO TABS
1.0000 | ORAL_TABLET | Freq: Every day | ORAL | Status: DC
  Administered 2023-02-08 – 2023-02-10 (×3): 1 via ORAL
  Filled 2023-02-08 (×3): qty 1

## 2023-02-08 MED ORDER — OLANZAPINE 5 MG PO TBDP
5.0000 mg | ORAL_TABLET | Freq: Every day | ORAL | Status: DC
  Administered 2023-02-08 – 2023-02-09 (×2): 5 mg via ORAL
  Filled 2023-02-08 (×2): qty 1
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
## 2023-02-08 NOTE — BH Assessment (Addendum)
Clinician referred patient to [facility].,  Raoul Pitch, coordinator with [facility], said that Dr. Maryelizabeth Kaufmann can see patient at 21:00.  RN Mariane Masters informed of referral via secure messaging.

## 2023-02-08 NOTE — ED Provider Notes (Signed)
[place] EMERGENCY DEPARTMENT AT [facility] Provider Note   CSN: [id number] Arrival date & time: 02/08/23  1650     History Chief Complaint  Patient presents with   V70.1    HPI Justin [name] is a 42 y.o. male presenting for psychosis. States that he has a history of schizophrenia has been stabilized on Zyprexa but that he has lost contact with a psychiatrist.  He has not taken Zyprexa in approximately a week and he has developed command hallucinations to kill himself and states that he has been normal for years and stabilized on this medication but that he feels that the hallucinations will make him kill himself.  States that he needed admission at [facility] in the past and had long-term improvement with treatment. Currently denies fevers chills nausea vomiting syncope or shortness of breath.  No other symptoms.  He is compliant with all of his other medications.  States that he tried to pick up the Zyprexa but the prescription was not there..   Patient's recorded medical, surgical, social, medication list and allergies were reviewed in the Snapshot window as part of the initial history.   Review of Systems   Review of Systems  Constitutional:  Negative for chills and fever.  HENT:  Negative for ear pain and sore throat.   Eyes:  Negative for pain and visual disturbance.  Respiratory:  Negative for cough and shortness of breath.   Cardiovascular:  Negative for chest pain and palpitations.  Gastrointestinal:  Negative for abdominal pain and vomiting.  Genitourinary:  Negative for dysuria and hematuria.  Musculoskeletal:  Negative for arthralgias and back pain.  Skin:  Negative for color change and rash.  Neurological:  Negative for seizures and syncope.  Psychiatric/Behavioral:  Positive for agitation and hallucinations.   All other systems reviewed and are negative.   Physical Exam Updated Vital Signs BP 125/81 (BP Location: Right Arm)   Pulse 64    Temp 97.9 F (36.6 C) (Oral)   Resp 18   SpO2 99%  Physical Exam Vitals and nursing note reviewed.  Constitutional:      General: He is not in acute distress.    Appearance: He is well-developed.  HENT:     Head: Normocephalic and atraumatic.  Eyes:     Conjunctiva/sclera: Conjunctivae normal.  Cardiovascular:     Rate and Rhythm: Normal rate and regular rhythm.     Heart sounds: No murmur heard. Pulmonary:     Effort: Pulmonary effort is normal. No respiratory distress.     Breath sounds: Normal breath sounds.  Abdominal:     Palpations: Abdomen is soft.     Tenderness: There is no abdominal tenderness.  Musculoskeletal:        General: No swelling.     Cervical back: Neck supple.  Skin:    General: Skin is warm and dry.     Capillary Refill: Capillary refill takes less than 2 seconds.  Neurological:     Mental Status: He is alert.  Psychiatric:        Mood and Affect: Mood normal.      ED Course/ Medical Decision Making/ A&P    Procedures Procedures   Medications Ordered in ED Medications  OLANZapine zydis (ZYPREXA) disintegrating tablet 5 mg (5 mg Oral Given 02/08/23 2149)  bictegravir-emtricitabine-tenofovir AF (BIKTARVY) 50-200-25 MG per tablet 1 tablet (1 tablet Oral Given 02/08/23 2002)  hydrOXYzine (ATARAX) tablet 25 mg (has no administration in time range)  levETIRAcetam (  KEPPRA) tablet 750 mg (750 mg Oral Given 02/08/23 1956)   Medical Decision Making:   Justin [name] is a 42 y.o. male who presented to the ED today for psychiatric evaluation.  Patient is endorsing command hallucinations to commit suicide.  Patient does have a history of schizophrenia for which they are on Zyprexa.  They are not compliant with their medications.  On my initial exam, the pt was linear in thought, appropriate in affect, and overall well-appearing.  Vital signs reviewed and reassuring.  Reviewed and confirmed nursing documentation for past medical history, family history,  social history.   Patient placed on continuous vitals and telemetry monitoring while in ED which was reviewed periodically.     Initial Assessment:   This is most consistent with an acute life threatening illness. With the patient's presentation of command hallucinations in the setting of noncompliance on Zyprexa, patient warrants emergent psychiatric consultation.  Differential includes primary psychosis, substance-induced psychosis, mood disturbance.  Initial Plan:  Patient immediately placed into ED psychiatric hold protocol including suicide precautions, elopement precautions and vital sign monitoring.    Emergent behavioral health hold signed and notarized while awaiting psychiatric consultation due to threat to self or others.  Psychiatry consulted for further evaluation once patient medically cleared.  Medical screening evaluation ordered and reviewed with no obvious medical reason to postpone psychiatric evaluation.  Patient is voluntary at this time.  No IVC.  May need to be reassessed if capacity is changing.     Final Assessment and Plan:   Patient placed on a psychiatric hold protocol pending psychiatric recommendations. Reassessed at bedside.  Psychiatry is recommended admission for stabilization.  Patient placed in the psychiatric hold protocol pending placement.   Clinical Impression:  1. Suicidal ideation      Data Unavailable   Final Clinical Impression(s) / ED Diagnoses Final diagnoses:  Suicidal ideation    Rx / DC Orders ED Discharge Orders     None         Glyn Ade, MD 02/08/23 2226

## 2023-02-08 NOTE — Progress Notes (Incomplete)
[facility] Consult Note  Patient Name: Justin [name] MRN: [id number] DOB: 06-13-1981 DATE OF Consult: 02/08/2023  PRIMARY PSYCHIATRIC DIAGNOSES  1.  *** 2.  *** 3.  ***  RECOMMENDATIONS  {Recommendations:304550007::"Medication recommendations: ***","Non-Medication/therapeutic recommendations: ***","Communication: Treatment team members (and family members if applicable) who were involved in treatment/care discussions and planning, and with whom we spoke or engaged with via secure text/chat, include the following: ***"}  Thank you for involving [place] in the care of this patient. If you have any additional questions or concerns, please call [phone] and ask for me or the provider on-call.  TELEPSYCHIATRY ATTESTATION & CONSENT  As the provider for this telehealth consult, I attest that I verified the patient's identity using two separate identifiers, introduced myself to the patient, provided my credentials, disclosed my location, and performed this encounter via a HIPAA-compliant, real-time, face-to-face, two-way, interactive audio and video platform and with the full consent and agreement of the patient (or guardian as applicable.)  Patient physical location: ***. Telehealth provider physical location: home office in state of ***.  Video start time: *** (Central Time) Video end time: *** (Central Time)  IDENTIFYING DATA  Justin [name] is a 42 y.o. year-old male for whom a psychiatric consultation has been ordered by the primary provider. The patient was identified using two separate identifiers.  CHIEF COMPLAINT/REASON FOR CONSULT  ***  HISTORY OF PRESENT ILLNESS (HPI)  The patient ***.  PAST PSYCHIATRIC HISTORY  *** Otherwise as per HPI above.  PAST MEDICAL HISTORY  Past Medical History:  Diagnosis Date  . Chronic hepatitis C without hepatic coma (HCC) 08/17/2017  . Endocarditis   . History of syphilis 08/25/2014   Treated by [facility] 07-2014.  [name] HIV (human  immunodeficiency virus infection) (HCC)   . Seizures (HCC)    ***  HOME MEDICATIONS  Facility Ordered Medications  Medication  . OLANZapine zydis (ZYPREXA) disintegrating tablet 5 mg  . bictegravir-emtricitabine-tenofovir AF (BIKTARVY) 50-200-25 MG per tablet 1 tablet  . [COMPLETED] levETIRAcetam (KEPPRA) tablet 750 mg  . hydrOXYzine (ATARAX) tablet 25 mg   PTA Medications  Medication Sig  . OLANZapine zydis (ZYPREXA) 5 MG disintegrating tablet Take 1 tablet (5 mg total) by mouth 2 (two) times daily. (Patient taking differently: Take 5 mg by mouth at bedtime.)  . traZODone (DESYREL) 50 MG tablet Take 1 tablet (50 mg total) by mouth at bedtime as needed for sleep.  [name] acetaminophen (TYLENOL) 500 MG tablet Take 1,500 mg by mouth as needed for moderate pain.  . bictegravir-emtricitabine-tenofovir AF (BIKTARVY) 50-200-25 MG TABS tablet Take 1 tablet by mouth daily.  [name] levETIRAcetam (KEPPRA) 750 MG tablet Take 2 tablets (1,500 mg total) by mouth 2 (two) times daily.   ***  ALLERGIES  No Known Allergies  SOCIAL & SUBSTANCE USE HISTORY  Social History   Socioeconomic History  . Marital status: Single    Spouse name: Not on file  . Number of children: Not on file  . Years of education: Not on file  . Highest education level: Not on file  Occupational History  . Not on file  Tobacco Use  . Smoking status: Every Day    Types: Cigarettes  . Smokeless tobacco: Never  Vaping Use  . Vaping status: Never Used  Substance and Sexual Activity  . Alcohol use: Not Currently    Comment: in rehab  . 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  Sexual activity: Not Currently    Partners: Male    Birth control/protection: Condom    Comment: given condoms  Other Topics Concern  . Not on file  Social History Narrative   ** Merged History Encounter **       ** Merged History Encounter **       ** Merged History Encounter **       **  Merged History Encounter **       Social Determinants of Health   Financial Resource Strain: Not on file  Food Insecurity: Food Insecurity Present (01/07/2023)   Hunger Vital Sign   . Worried About Programme researcher, broadcasting/film/video in the Last Year: Sometimes true   . Ran Out of Food in the Last Year: Sometimes true  Transportation Needs: Unmet Transportation Needs (01/07/2023)   PRAPARE - Transportation   . Lack of Transportation (Medical): Yes   . Lack of Transportation (Non-Medical): Yes  Physical Activity: Not on file  Stress: Not on file  Social Connections: Not on file   Social History   Tobacco Use  Smoking Status Every Day  . Types: Cigarettes  Smokeless Tobacco Never   Social History   Substance and Sexual Activity  Alcohol Use Not Currently   Comment: in rehab   Social History   Substance and Sexual Activity  Drug Use Yes  . 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  FAMILY HISTORY  Family History  Adopted: Yes  Problem Relation Age of Onset  . Diabetes type II Mother    Family Psychiatric History (if known):  ***  MENTAL STATUS EXAM (MSE)  Presentation  General Appearance:  Appropriate for Environment  Eye Contact: Fair  Speech: Clear and Coherent  Speech Volume: Normal  Handedness: Right   Mood and Affect  Mood: Depressed; Anxious  Affect: Congruent   Thought Process  Thought Processes: Goal Directed  Descriptions of Associations: Intact  Orientation: Full (Time, Place and Person)  Thought Content: Logical  History of Schizophrenia/Schizoaffective disorder: No  Duration of Psychotic Symptoms: Less than six months (In context of amphetamine use)  Hallucinations: Hallucinations: Auditory Description of Auditory Hallucinations: perceives male voice telling him to to impart harm to self  Ideas of Reference: Percusatory  Suicidal Thoughts: Suicidal  Thoughts: Yes, Active (thoughts of acquiring firearm and shooting self) SI Active Intent and/or Plan: With Plan; With Intent  Homicidal Thoughts: Homicidal Thoughts: Yes, Passive HI Passive Intent and/or Plan: Without Plan   Sensorium  Memory: Immediate Fair  Judgment: Fair  Insight: Fair   Chartered certified accountant: Fair  Attention Span: Fair  Recall: Fair  Fund of Knowledge: Fair  Language: Fair   Psychomotor Activity  Psychomotor Activity: Psychomotor Activity: Normal   Assets  Assets: Desire for Improvement   Sleep  Sleep: Sleep: Fair   VITALS  Blood pressure 125/81, pulse 64, temperature 97.9 F (36.6 C), temperature source Oral, resp. rate 18, SpO2 99%.  LABS  Admission on 02/08/2023  Component Date Value Ref Range Status  . Sodium 02/08/2023 140  135 - 145 mmol/L Final  . Potassium 02/08/2023 3.8  3.5 - 5.1 mmol/L Final  . Chloride 02/08/2023 107  98 - 111 mmol/L Final  . CO2 02/08/2023 20 (L)  22 - 32 mmol/L Final  . Glucose, Bld 02/08/2023 80  70 - 99 mg/dL Final   Glucose reference range applies only to samples taken after fasting for at  least 8 hours.  . BUN 02/08/2023 12  6 - 20 mg/dL Final  . Creatinine, Ser 02/08/2023 1.05  0.61 - 1.24 mg/dL Final  . Calcium 16/04/9603 9.0  8.9 - 10.3 mg/dL Final  . Total Protein 02/08/2023 7.1  6.5 - 8.1 g/dL Final  . Albumin 54/03/8118 4.0  3.5 - 5.0 g/dL Final  . AST 14/78/2956 27  15 - 41 U/L Final  . ALT 02/08/2023 31  0 - 44 U/L Final  . Alkaline Phosphatase 02/08/2023 85  38 - 126 U/L Final  . Total Bilirubin 02/08/2023 0.7  0.3 - 1.2 mg/dL Final  . GFR, Estimated 02/08/2023 >60  >60 mL/min Final   Comment: (NOTE) Calculated using the CKD-EPI Creatinine Equation (2021)   . Anion gap 02/08/2023 13  5 - 15 Final   Performed at [facility], [address] [place], [place] [address]    PSYCHIATRIC REVIEW OF SYSTEMS (ROS)  ROS: Notable for the following  relevant positive findings: ROS  Additional findings:      Musculoskeletal: {Musculoskeletal neeeds/assessment:304550014}      Gait & Station: {Gait and Station:304550016}      Pain Screening: {Pain Description:304550015}      Nutrition & Dental Concerns: {Nutrition & Dental Concerns:304550017}  RISK FORMULATION/ASSESSMENT  Is the patient experiencing any suicidal or homicidal ideations: {yes/no:20286}       Explain if yes: *** Protective factors considered for safety management: ***  Risk factors/concerns considered for safety management: *** {CHL BH Risk Factors Safety Management:304550011}  Is there a safety management plan with the patient and treatment team to minimize risk factors and promote protective factors: {yes/no:20286}           Explain: *** Is crisis care placement or psychiatric hospitalization recommended: {yes/no:20286}     Based on my current evaluation and risk assessment, patient is determined at this time to be at:  {Risk level:304550009}  *RISK ASSESSMENT Risk assessment is a dynamic process; it is possible that this patient's condition, and risk level, may change. This should be re-evaluated and managed over time as appropriate. Please re-consult psychiatric consult services if additional assistance is needed in terms of risk assessment and management. If your team decides to discharge this patient, please advise the patient how to best access emergency psychiatric services, or to call 911, if their condition worsens or they feel unsafe in any way.   Curlene Labrum, MD Telepsychiatry Consult Services

## 2023-02-08 NOTE — ED Triage Notes (Signed)
PT reports hearing voices that are telling him to hurt himself and hurt other people.

## 2023-02-08 NOTE — ED Notes (Signed)
X2 attempt unable to collect labs.

## 2023-02-09 MED ORDER — HYDROXYZINE HCL 25 MG PO TABS
25.0000 mg | ORAL_TABLET | Freq: Once | ORAL | Status: AC
  Administered 2023-02-09: 25 mg via ORAL
  Filled 2023-02-09: qty 1

## 2023-02-09 MED ORDER — LEVETIRACETAM 500 MG PO TABS
1500.0000 mg | ORAL_TABLET | Freq: Two times a day (BID) | ORAL | Status: DC
  Administered 2023-02-09 – 2023-02-10 (×3): 1500 mg via ORAL
  Filled 2023-02-09 (×3): qty 3

## 2023-02-09 NOTE — Progress Notes (Signed)
11:17 AM - CSW spoke with Donnal Debar, intake coordinator, at [facility] via phone call. Randi reports Mikey Bussing is willing to accept pt, pending completed labwork. CSW notified RN and NP.  Cathie Beams, LCSW  02/09/2023 11:51 AM

## 2023-02-09 NOTE — Progress Notes (Signed)
LCSW Progress Note  [id number]   Justin [name]  02/09/2023  2:27 AM    Inpatient Behavioral Health Placement  Pt meets inpatient criteria per  Rodena Medin, MD-Telepsychiatry Consult Services. There are no available beds within [facility] system per [facility] AC Erica Wright,RN. Referral was sent to the following facilities;   Destination  Service Provider Address Phone [facility]  [address] [place], [place] [address] [phone]  [facility]  [address] [place] [address] [phone]  [facility] [address]  [address] [place] [address] [phone]  [facility]  [address] [place] [address] [phone]  [facility]  [phone] [address] [place] [address] [phone]  [facility]  [address] [place], [place] [address] [phone]  [facility]  [address] [place] [address] [phone]  [facility]  [address] [place] [address] [phone]  [facility]  [address], [place] [address] [phone]  [facility] Patient [facility]  [facility], [place] [phone]  [facility]  [address], [place] [address] [phone]  [facility]  [address] [place] [address] [phone]  [facility]  [address] [place] [address] [phone]  [facility]  [address], [place] [address] [phone]  [facility]  [place] [phone]  [facility]  [address]  [place] [address] [phone]  Renella Cunas  [place] -- [phone]  [facility]  [address], [place] [address] [phone]  [facility]  [address], [place] [address] [phone]  [facility]  [address], [place] [address] [phone]  [facility]  [address] [place], [place] [address] [phone]  [facility]  [address], [place] [address] [phone]  [facility] Inpatient [facility]  [place] [phone]  [facility]  [address], [place] [address] [phone]  [facility]  [address] [place] [address] [phone]  [facility] [place] [address] [phone]  [facility]  [address] [place], [place] [address] [phone]  [facility]  [address] [place] [address] [phone]    Situation ongoing,  CSW will follow up.    [name], MSW, LCSWA 02/09/2023 2:27 AM

## 2023-02-09 NOTE — Progress Notes (Signed)
12:57 PM - Per pt's request, this CSW attempted to speak with Morrie Sheldon, intake coordinator, at [facility] via phone call. CSW left a VM inquiring about bed availability for pt. CSW will await follow up.  Cathie Beams, LCSW  02/09/2023 1:00 PM

## 2023-02-09 NOTE — Progress Notes (Signed)
3:56 PM - CSW attempted to speak with Donnal Debar, intake coordinator, at [facility] via phone call regarding pt's status. CSW was unable to speak with Donnal Debar, but left a voicemail requesting an update. CSW will await follow up.  Cathie Beams, [place]  02/09/2023 4:02 PM

## 2023-02-09 NOTE — ED Notes (Signed)
Patient calm and resting in bed quietly

## 2023-02-10 LAB — RESP PANEL BY RT-PCR (RSV, FLU A&B, COVID)  RVPGX2
Influenza A by PCR: NEGATIVE
Influenza B by PCR: NEGATIVE
Resp Syncytial Virus by PCR: NEGATIVE
SARS Coronavirus 2 by RT PCR: NEGATIVE

## 2023-02-10 NOTE — ED Notes (Signed)
Called safe transport and they said it would be an extended wait for pick up   there are several others ahead of this one   probably sometime thi evening

## 2023-02-10 NOTE — Progress Notes (Signed)
CSW sent Labs to [facility] and [place] as requested. Referral was sent to out of network due to no available beds within [facility] system per [facility] Justin Clarity, RN.   Destination  Service Provider Address Phone [facility]  [address] [place], [place] [address] [phone]  [facility]  [address] [place] [address] [phone]  [facility] [address]  [address], [place] [address] [phone]  [facility]  [address] [place] [address] [phone]  [facility]  [phone] [address] [place] [address] [phone]  [facility]  [address] [place], [place] [address] [phone]  [facility]  [address] [place] [address] [phone]  [facility]  [address] [place] [address] [phone]  [facility]  [address] [place] [address] [phone]  [facility] Patient Placement  [facility], [place] [phone]  [facility]  [address] [place] [address] [phone]  [facility]  [address], [place] [address] [phone]  [facility]  [address], [place] [address] [phone]  [facility]  [address] [place] [address] [phone]  [facility]  [place] [phone]  [facility]  [address] [place] [address] [phone]  Renella Cunas  [place] -- [phone]  [facility]  [address] [place] [address] [phone]   [facility]  [address], [place] [address] [phone]  [facility]  [address] [place], [place] [address] [phone]  [facility]  [address] [place], [place] [address] [phone]  [facility]  [address], [place] [address] [phone]  [facility] Psychiatry Inpatient [facility]  [place] [phone]  [facility]  [address] [place] [address], [place] [address] [phone]  [facility]  [address] medical [place] [address] [phone]  [facility] [place] [address] [phone]  [facility]  [address] [place], [place] [address] [phone]  [facility]  [address] [place] [address] [phone]  [facility]  [address] [place] [address] [phone]    [name], MSW, LCSWA 02/10/2023 3:12 AM

## 2023-02-10 NOTE — Progress Notes (Signed)
Pt was accepted to [facility] TODAY 02/10/2023. Bed assignment: [place] [facility]  Pt meets inpatient criteria per Phebe Colla, NP  Attending Physician will be Odette Fraction, MD  Report can be called to: [phone] (please wait to call report until after pt has left with transport)  Pt can arrive after 2 PM  Care Team Notified: Phebe Colla, NP and Carleene Overlie, Paramedic  [place], LCSW  02/10/2023 10:58 AM

## 2023-02-10 NOTE — ED Provider Notes (Signed)
Emergency Medicine Observation Re-evaluation Note  Justin [name] is a 42 y.o. male, seen on rounds today.  Pt initially presented to the ED for complaints of V70.1 Currently, the patient is resting comfortably.  Physical Exam  BP 110/70 (BP Location: Right Arm)   Pulse 60   Temp 98.1 F (36.7 C) (Oral)   Resp 20   SpO2 96%  Physical Exam General: No acute distress Cardiac: Regular rate Lungs: No respiratory distress Psych: Currently calm  ED Course / MDM  EKG:EKG Interpretation Date/Time:  Wednesday February 08 2023 20:07:20 EDT Ventricular Rate:  61 PR Interval:  158 QRS Duration:  96 QT Interval:  402 QTC Calculation: 404 R Axis:   -25  Text Interpretation: Normal sinus rhythm with sinus arrhythmia Minimal voltage criteria for LVH, may be normal variant ( R in aVL ) Borderline ECG When compared with ECG of 17-Jan-2023 21:26, new inferior ischemic changes Confirmed by Meridee Score [phone]) on 02/09/2023 10:37:29 AM  I have reviewed the labs performed to date as well as medications administered while in observation.  Recent changes in the last 24 hours include -no new changes.  Patient has history of psychiatry condition, appears decompensated.  Psych team has recommended inpatient admission  Plan  Current plan is for patient to be held in the ER for psychiatry hold.    Derwood Kaplan, MD 02/10/23 [phone]

## 2023-02-15 ENCOUNTER — Other Ambulatory Visit (HOSPITAL_COMMUNITY): Payer: Self-pay

## 2023-02-16 ENCOUNTER — Ambulatory Visit: Payer: Self-pay | Admitting: Internal Medicine

## 2023-03-02 ENCOUNTER — Telehealth: Payer: Self-pay

## 2023-03-02 NOTE — Telephone Encounter (Signed)
Patient called office requesting prescription for antibiotics for sinus infection. States for the past week he has had nasal drainage, cough, and sputum build up. Describes sputum as dark green in color. Is taking Nyquil and Zicam, but has not had relife in symptoms.  States he did take covid test which was negative. Would like to know if MD could send antibiotics to [facility] on [address].  Is also requesting refill on Keppra. Is scheduled for appt with MD tomorrow for follow up.  Juanita Laster, RMA

## 2023-03-03 ENCOUNTER — Ambulatory Visit (INDEPENDENT_AMBULATORY_CARE_PROVIDER_SITE_OTHER): Payer: Self-pay | Admitting: Internal Medicine

## 2023-03-03 ENCOUNTER — Encounter: Payer: Self-pay | Admitting: Internal Medicine

## 2023-03-03 ENCOUNTER — Ambulatory Visit: Payer: Self-pay

## 2023-03-03 ENCOUNTER — Other Ambulatory Visit: Payer: Self-pay

## 2023-03-03 VITALS — BP 115/80 | HR 92 | Temp 97.4°F | Ht 72.0 in | Wt 223.0 lb

## 2023-03-03 DIAGNOSIS — Z79899 Other long term (current) drug therapy: Secondary | ICD-10-CM

## 2023-03-03 DIAGNOSIS — G40909 Epilepsy, unspecified, not intractable, without status epilepticus: Secondary | ICD-10-CM

## 2023-03-03 DIAGNOSIS — B20 Human immunodeficiency virus [HIV] disease: Secondary | ICD-10-CM

## 2023-03-03 DIAGNOSIS — Z59811 Housing instability, housed, with risk of homelessness: Secondary | ICD-10-CM

## 2023-03-03 DIAGNOSIS — R45851 Suicidal ideations: Secondary | ICD-10-CM

## 2023-03-03 DIAGNOSIS — Z113 Encounter for screening for infections with a predominantly sexual mode of transmission: Secondary | ICD-10-CM

## 2023-03-03 DIAGNOSIS — Z23 Encounter for immunization: Secondary | ICD-10-CM

## 2023-03-03 DIAGNOSIS — J069 Acute upper respiratory infection, unspecified: Secondary | ICD-10-CM

## 2023-03-03 DIAGNOSIS — B182 Chronic viral hepatitis C: Secondary | ICD-10-CM

## 2023-03-03 MED ORDER — BIKTARVY 50-200-25 MG PO TABS: 1.0000 | ORAL_TABLET | Freq: Every day | ORAL | 11 refills | Status: DC

## 2023-03-03 NOTE — Assessment & Plan Note (Signed)
Will screen and do anal pap

## 2023-03-03 NOTE — Addendum Note (Signed)
Addended by: Harley Alto on: 03/03/2023 09:34 AM   Modules accepted: Orders

## 2023-03-03 NOTE — Assessment & Plan Note (Signed)
He is doing well from this standpoint and taking the medication well. Will check his labs today and have him follow up in about 3 months  I have personally spent 57 minutes involved in face-to-face and non-face-to-face activities for this patient on the day of the visit. Professional time spent includes the following activities: Preparing to see the patient (review of tests), Obtaining and/or reviewing separately obtained history (admission/discharge record), Performing a medically appropriate examination and/or evaluation , Ordering medications/tests/procedures, referring and communicating with other health care professionals, Documenting clinical information in the EMR, Independently interpreting results (not separately reported), Communicating results to the patient/family/caregiver, Counseling and educating the patient/family/caregiver and Care coordination (not separately reported).

## 2023-03-03 NOTE — Assessment & Plan Note (Signed)
He has refills of his seizure medications

## 2023-03-03 NOTE — Assessment & Plan Note (Signed)
No current SI.

## 2023-03-03 NOTE — Assessment & Plan Note (Signed)
New issue.  Discussed supportive care.  No indication for antibiotics

## 2023-03-03 NOTE — Addendum Note (Signed)
Addended by: Harley Alto on: 03/03/2023 11:41 AM   Modules accepted: Orders

## 2023-03-03 NOTE — Assessment & Plan Note (Signed)
Will check his lipid panel Will consider adding a statin at some point but want to emphasize his current medications and see him stablize

## 2023-03-03 NOTE — Assessment & Plan Note (Signed)
Will rescreen with his active recent drug use

## 2023-03-03 NOTE — Assessment & Plan Note (Signed)
Put him back in touch with [facility]

## 2023-03-03 NOTE — Progress Notes (Signed)
   Subjective:    Patient ID: Justin [name], male    DOB: 12/08/1980, 42 y.o.   MRN: [id number]  HPI Justin [name] is here for follow up of HIV He continues to struggle with substance abuse, mental illness.  He has been on Biktarvy and taking well.  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  He is in a residential substance abuse program now and doing well.  Will need help with housing though.     Review of Systems  Constitutional:  Negative for fatigue.  Gastrointestinal:  Negative for diarrhea and nausea.  Skin:  Negative for rash.       Objective:   Physical Exam Eyes:     General: No scleral icterus. Pulmonary:     Effort: Pulmonary effort is normal.  Neurological:     Mental Status: He is alert.   SH:+ tobacco        Assessment & Plan:

## 2023-03-04 LAB — CT/NG RNA, TMA RECTAL
Chlamydia Trachomatis RNA: NOT DETECTED
Neisseria Gonorrhoeae RNA: NOT DETECTED

## 2023-03-04 LAB — GC/CHLAMYDIA PROBE, AMP (THROAT)
Chlamydia trachomatis RNA: NOT DETECTED
Neisseria gonorrhoeae RNA: NOT DETECTED

## 2023-03-08 LAB — CBC WITH DIFFERENTIAL/PLATELET
Absolute Monocytes: 671 {cells}/uL (ref 200–950)
Basophils Absolute: 21 {cells}/uL (ref 0–200)
Basophils Relative: 0.4 %
Eosinophils Absolute: 99 {cells}/uL (ref 15–500)
Eosinophils Relative: 1.9 %
HCT: 48.3 % (ref 38.5–50.0)
Hemoglobin: 17 g/dL (ref 13.2–17.1)
Lymphs Abs: 1846 {cells}/uL (ref 850–3900)
MCH: 32.2 pg (ref 27.0–33.0)
MCHC: 35.2 g/dL (ref 32.0–36.0)
MCV: 91.5 fL (ref 80.0–100.0)
MPV: 10 fL (ref 7.5–12.5)
Monocytes Relative: 12.9 %
Neutro Abs: 2564 {cells}/uL (ref 1500–7800)
Neutrophils Relative %: 49.3 %
Platelets: 219 10*3/uL (ref 140–400)
RBC: 5.28 10*6/uL (ref 4.20–5.80)
RDW: 11.5 % (ref 11.0–15.0)
Total Lymphocyte: 35.5 %
WBC: 5.2 10*3/uL (ref 3.8–10.8)

## 2023-03-08 LAB — COMPLETE METABOLIC PANEL WITH GFR
AG Ratio: 1.7 (calc) (ref 1.0–2.5)
ALT: 25 U/L (ref 9–46)
AST: 31 U/L (ref 10–40)
Albumin: 5.1 g/dL (ref 3.6–5.1)
Alkaline phosphatase (APISO): 109 U/L (ref 36–130)
BUN/Creatinine Ratio: 17 (calc) (ref 6–22)
BUN: 23 mg/dL (ref 7–25)
CO2: 26 mmol/L (ref 20–32)
Calcium: 9.8 mg/dL (ref 8.6–10.3)
Chloride: 95 mmol/L — ABNORMAL LOW (ref 98–110)
Creat: 1.34 mg/dL — ABNORMAL HIGH (ref 0.60–1.29)
Globulin: 3 g/dL (ref 1.9–3.7)
Glucose, Bld: 111 mg/dL — ABNORMAL HIGH (ref 65–99)
Potassium: 3.5 mmol/L (ref 3.5–5.3)
Sodium: 134 mmol/L — ABNORMAL LOW (ref 135–146)
Total Bilirubin: 1.8 mg/dL — ABNORMAL HIGH (ref 0.2–1.2)
Total Protein: 8.1 g/dL (ref 6.1–8.1)
eGFR: 68 mL/min/{1.73_m2} (ref >=60)

## 2023-03-08 LAB — LIPID PANEL
Cholesterol: 173 mg/dL (ref <200)
HDL: 44 mg/dL (ref >=40)
LDL Cholesterol (Calc): 109 mg/dL — ABNORMAL HIGH
Non-HDL Cholesterol (Calc): 129 mg/dL (calc) (ref <130)
Total CHOL/HDL Ratio: 3.9 (calc) (ref <5.0)
Triglycerides: 103 mg/dL (ref <150)

## 2023-03-08 LAB — T PALLIDUM AB: T Pallidum Abs: POSITIVE — AB

## 2023-03-08 LAB — T-HELPER CELLS (CD4) COUNT (NOT AT ARMC)
Absolute CD4: 526 {cells}/uL (ref 490–1740)
CD4 T Helper %: 28 % — ABNORMAL LOW (ref 30–61)
Total lymphocyte count: 1859 {cells}/uL (ref 850–3900)

## 2023-03-08 LAB — HEPATITIS B SURFACE ANTIBODY,QUALITATIVE: Hep B S Ab: REACTIVE — AB

## 2023-03-08 LAB — RPR TITER: RPR Titer: 1:2 {titer} — ABNORMAL HIGH

## 2023-03-08 LAB — HIV-1 RNA QUANT-NO REFLEX-BLD
HIV 1 RNA Quant: NOT DETECTED {copies}/mL
HIV-1 RNA Quant, Log: NOT DETECTED {Log_copies}/mL

## 2023-03-08 LAB — HEPATITIS C RNA QUANTITATIVE
HCV Quantitative Log: 6.15 {Log_IU}/mL — ABNORMAL HIGH
HCV RNA, PCR, QN: 1420000 [IU]/mL — ABNORMAL HIGH

## 2023-03-08 LAB — RPR: RPR Ser Ql: REACTIVE — AB

## 2023-03-09 ENCOUNTER — Other Ambulatory Visit: Payer: Self-pay

## 2023-03-09 DIAGNOSIS — G40909 Epilepsy, unspecified, not intractable, without status epilepticus: Secondary | ICD-10-CM

## 2023-03-09 LAB — URINE CYTOLOGY ANCILLARY ONLY
Chlamydia: NEGATIVE
Comment: NEGATIVE
Comment: NORMAL
Neisseria Gonorrhea: NEGATIVE

## 2023-03-09 LAB — CYTOLOGY - PAP: Diagnosis: NEGATIVE

## 2023-03-09 MED ORDER — LEVETIRACETAM 750 MG PO TABS: 1500.0000 mg | ORAL_TABLET | Freq: Two times a day (BID) | ORAL | 2 refills | Status: DC

## 2023-03-09 NOTE — Progress Notes (Signed)
Patient called front desk requesting refill, okay to refill per Dr. [name].   Sandie Ano, RN

## 2023-03-14 ENCOUNTER — Telehealth: Payer: Self-pay

## 2023-03-14 NOTE — Telephone Encounter (Signed)
Patient called office requesting letter for court stating that he has history of seizure and this results in memory loss. He needs letter for court regarding pending trespassing charges.  States that before incident occurred he had seizure. Does not remember much from that day. Will forward message to provider to advise on letter.  Does not see Neurology for seizures  Juanita Laster, RMA

## 2023-03-15 NOTE — Telephone Encounter (Signed)
Spoke with Dr. [name] who is okay with letter. Patient updated. Will send letter to him on mychart and leave signed copy at front desk.  Will have someone pick it up later today. Juanita Laster, RMA

## 2023-03-20 ENCOUNTER — Encounter (HOSPITAL_COMMUNITY): Payer: Self-pay

## 2023-03-20 ENCOUNTER — Emergency Department (HOSPITAL_COMMUNITY)
Admission: EM | Admit: 2023-03-20 | Discharge: 2023-03-20 | Disposition: A | Discharge status: Home / Self Care | Payer: MEDICAID | Attending: Emergency Medicine | Admitting: Emergency Medicine

## 2023-03-20 DIAGNOSIS — X58XXXA Exposure to other specified factors, initial encounter: Secondary | ICD-10-CM | POA: Diagnosis not present

## 2023-03-20 DIAGNOSIS — Z21 Asymptomatic human immunodeficiency virus [HIV] infection status: Secondary | ICD-10-CM | POA: Insufficient documentation

## 2023-03-20 DIAGNOSIS — F17210 Nicotine dependence, cigarettes, uncomplicated: Secondary | ICD-10-CM | POA: Insufficient documentation

## 2023-03-20 DIAGNOSIS — S0502XA Injury of conjunctiva and corneal abrasion without foreign body, left eye, initial encounter: Secondary | ICD-10-CM | POA: Diagnosis present

## 2023-03-20 MED ORDER — ERYTHROMYCIN 5 MG/GM OP OINT: TOPICAL_OINTMENT | Freq: Four times a day (QID) | OPHTHALMIC | 0 refills | Status: DC

## 2023-03-20 MED ORDER — OXYCODONE HCL 5 MG PO TABS
5.0000 mg | ORAL_TABLET | Freq: Four times a day (QID) | ORAL | 0 refills | Status: DC | PRN
Start: 2023-03-20 — End: 2023-05-02

## 2023-03-20 MED ORDER — TETRACAINE HCL 0.5 % OP SOLN
1.0000 [drp] | Freq: Once | OPHTHALMIC | Status: AC
  Administered 2023-03-20: 1 [drp] via OPHTHALMIC
  Filled 2023-03-20: qty 4

## 2023-03-20 MED ORDER — FLUORESCEIN SODIUM 1 MG OP STRP
1.0000 | ORAL_STRIP | Freq: Once | OPHTHALMIC | Status: AC
  Administered 2023-03-20: 1 via OPHTHALMIC
  Filled 2023-03-20: qty 1

## 2023-03-20 NOTE — ED Provider Notes (Signed)
[place] EMERGENCY DEPARTMENT AT [facility] Provider Note  CSN: [id number] Arrival date & time: 03/20/23 4098  Chief Complaint(s) Eye Drainage  HPI Justin [name] is a 42 y.o. male history of HIV, seizure disorder presenting to the emergency department with left eye pain.  The patient reports that he woke up and had some recent amnesia and a mild headache which is consistent with his previous seizures.  He noticed that he had eye pain and blurry vision.  He woke up in the bed.  He also had a bite on his tongue.  He reports compliance with his regular seizure medication and still has seizures occasionally.   Past Medical History Past Medical History:  Diagnosis Date   Chronic hepatitis C without hepatic coma (HCC) 08/17/2017   Endocarditis    History of syphilis 08/25/2014   Treated by [facility] 07-2014.   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HCC)    Suicidal ideations 07/08/2020   Amphetamine use disorder, 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 Authorizing Provider  erythromycin ophthalmic ointment Place into the left eye 4 (four) times daily. Place a 1/2 inch ribbon of ointment into the lower eyelid. 03/20/23  Yes Lonell Grandchild, MD  oxyCODONE (ROXICODONE) 5 MG immediate release tablet Take 1 tablet (5 mg total) by mouth every 6 (six) hours as needed for severe pain. 03/20/23  Yes Lonell Grandchild, MD  acetaminophen (TYLENOL) 500 MG tablet Take 1,500 mg by mouth as needed for moderate pain.    [provider]  bictegravir-emtricitabine-tenofovir AF (BIKTARVY) 50-200-25 MG TABS tablet Take 1 tablet by mouth daily. 03/03/23   [name], MD  hydrOXYzine (VISTARIL) 50 MG capsule Take 50 mg by mouth daily as needed for anxiety or itching. Patient not taking: Reported on 03/03/2023    [provider]  levETIRAcetam (KEPPRA) 750 MG tablet Take 2 tablets (1,500 mg total) by mouth 2 (two) times daily. 03/09/23   Comer, Belia Heman, MD  OLANZapine zydis (ZYPREXA) 5 MG disintegrating tablet Take 1 tablet (5 mg total) by mouth 2 (two) times daily. Patient not taking: Reported on 03/03/2023 01/12/23   Lewanda Rife, MD  traZODone (DESYREL) 50 MG tablet Take 1 tablet (50 mg total) by mouth at bedtime as needed for sleep. Patient not taking: Reported on 03/03/2023 01/12/23   Lewanda Rife, MD  Past Surgical History Past Surgical History:  Procedure Laterality Date   hand surgery Right    LUMBAR PUNCTURE  08/08/2019       WRIST SURGERY     Family History Family History  Adopted: Yes  Problem Relation Age of Onset   Diabetes type II Mother     Social History Social History   Tobacco Use   Smoking status: Every Day    Types: Cigarettes   Smokeless tobacco: Never  Vaping Use   Vaping status:  Never Used  Substance Use Topics   Alcohol use: Not Currently    Comment: in rehab   Drug use: Yes    Types: Marijuana, Methamphetamines    Comment: states last meth use was 08/2021   Allergies Patient has no known allergies.  Review of Systems Review of Systems  All other systems reviewed and are negative.   Physical Exam Vital Signs  I have reviewed the triage vital signs BP 117/78 (BP Location: Right Arm)   Pulse 64   Temp 97.6 F (36.4 C) (Oral)   Resp 17   SpO2 100%  Physical Exam Vitals and nursing note reviewed.  Constitutional:      General: He is not in acute distress.    Appearance: Normal appearance.  HENT:     Head:     Comments: Small abrasion inferior to the left eye    Mouth/Throat:     Mouth: Mucous membranes are moist.  Eyes:     Conjunctiva/sclera: Conjunctivae normal.     Comments: Pupils equal and reactive.  Left conjunctival injection with small areas of subconjunctival hemorrhage.  Fluorescein exam on the left with large corneal abrasion, negative Seidel sign.  Lids swept, no obvious foreign body. IOP 19 R, 18 L  Cardiovascular:     Rate and Rhythm: Normal rate and regular rhythm.  Pulmonary:     Effort: Pulmonary effort is normal. No respiratory distress.     Breath sounds: Normal breath sounds.  Abdominal:     General: Abdomen is flat.     Palpations: Abdomen is soft.     Tenderness: There is no abdominal tenderness.  Musculoskeletal:     Right lower leg: No edema.     Left lower leg: No edema.  Skin:    General: Skin is warm and dry.     Capillary Refill: Capillary refill takes less than 2 seconds.  Neurological:     Mental Status: He is alert and oriented to person, place, and time. Mental status is at baseline.  Psychiatric:        Mood and Affect: Mood normal.        Behavior: Behavior normal.     ED Results and Treatments Labs (all labs ordered are listed, but only abnormal results are displayed) Labs Reviewed - No data to  display  Radiology No results found.  Pertinent labs & imaging results that were available during my care of the patient were reviewed by me and considered in my medical decision making (see MDM for details).  Medications Ordered in ED Medications  tetracaine (PONTOCAINE) 0.5 % ophthalmic solution 1 drop (has no administration in time range)  fluorescein ophthalmic strip 1 strip (has no administration in time range)                                                                                                                                     Procedures Procedures  (including critical care time)  Medical Decision Making / ED Course   MDM:  41 year old male presenting to the emergency department with eye pain.  On exam, patient has a corneal abrasion.  Very low concern for other dangerous process such as open globe, foreign body.  He does not wear contact lens.  Suspect primary cause was probably seizure/trauma. Pain resolved with tetracaine drops. Visual acuity reviewed and slightly diminished but reassuring.  Will prescribe erythromycin ointment. [place] Controlled Substance Reporting System database was reviewed. and patient was instructed, not to drive, operate heavy machinery, perform activities at heights, swimming or participation in water activities or provide baby-sitting services while on Pain, Sleep and Anxiety Medications; until their outpatient Physician has advised to do so again. Also recommended to not to take more than prescribed Pain, Sleep and Anxiety Medications. Advised follow up with ophthalmology. Will discharge patient to home. All questions answered. Patient comfortable with plan of discharge. Return precautions discussed with patient and specified on the after visit summary.        Medicines ordered and prescription drug  management: Meds ordered this encounter  Medications   tetracaine (PONTOCAINE) 0.5 % ophthalmic solution 1 drop   fluorescein ophthalmic strip 1 strip   erythromycin ophthalmic ointment    Sig: Place into the left eye 4 (four) times daily. Place a 1/2 inch ribbon of ointment into the lower eyelid.    Dispense:  3.5 g    Refill:  0   oxyCODONE (ROXICODONE) 5 MG immediate release tablet    Sig: Take 1 tablet (5 mg total) by mouth every 6 (six) hours as needed for severe pain.    Dispense:  5 tablet    Refill:  0    -I have reviewed the patients home medicines and have made adjustments as needed   Reevaluation: After the interventions noted above, I reevaluated the patient and found that their symptoms have improved  Co morbidities that complicate the patient evaluation  Past Medical History:  Diagnosis Date   Chronic hepatitis C without hepatic coma (HCC) 08/17/2017   Endocarditis    History of syphilis 08/25/2014   Treated by [facility] 07-2014.   HIV (human immunodeficiency virus infection) (HCC)    Seizures (HCC)       Dispostion: Disposition decision including need for hospitalization  was considered, and patient discharged from emergency department.    Final Clinical Impression(s) / ED Diagnoses Final diagnoses:  Abrasion of left cornea, initial encounter     This chart was dictated using voice recognition software.  Despite best efforts to proofread,  errors can occur which can change the documentation meaning.    Lonell Grandchild, MD 03/20/23 1258

## 2023-03-20 NOTE — ED Triage Notes (Signed)
Pt arrived via POV, states woke with eye pain and drainage, pillow was covered with blood.

## 2023-03-20 NOTE — Discharge Instructions (Addendum)
We evaluated you for your eye injury.  You have a corneal abrasion.  We have given you an antibacterial ointment which you should use 4 times a day.  Please take Tylenol and Motrin for your symptoms at home.  You can take 1000 mg of Tylenol every 6 hours and 600 mg of ibuprofen every 6 hours as needed for your symptoms.  You can take these medicines together as needed, either at the same time, or alternating every 3 hours.  I prescribed you a few oxycodone to take if you have severe pain that is not relieved by Tylenol or Motrin.  Please only take this if needed and do not mix with alcohol as it may make you drowsy.  Please follow-up with an ophthalmologist.  Please call for an appointment with Dr. [name].  If you have any new or worsening symptoms such as loss of vision, severe headaches, vomiting, or any other new symptoms, please return to the emergency department

## 2023-03-22 ENCOUNTER — Encounter (HOSPITAL_COMMUNITY): Payer: Self-pay | Admitting: *Deleted

## 2023-03-22 ENCOUNTER — Other Ambulatory Visit: Payer: Self-pay

## 2023-03-22 ENCOUNTER — Emergency Department (HOSPITAL_COMMUNITY)
Admission: EM | Admit: 2023-03-22 | Discharge: 2023-03-22 | Disposition: A | Discharge status: Home / Self Care | Payer: MEDICAID | Attending: Emergency Medicine | Admitting: Emergency Medicine

## 2023-03-22 ENCOUNTER — Emergency Department (HOSPITAL_COMMUNITY): Payer: MEDICAID

## 2023-03-22 DIAGNOSIS — R569 Unspecified convulsions: Secondary | ICD-10-CM | POA: Diagnosis not present

## 2023-03-22 DIAGNOSIS — G40909 Epilepsy, unspecified, not intractable, without status epilepticus: Secondary | ICD-10-CM

## 2023-03-22 DIAGNOSIS — R4182 Altered mental status, unspecified: Secondary | ICD-10-CM | POA: Insufficient documentation

## 2023-03-22 DIAGNOSIS — Z21 Asymptomatic human immunodeficiency virus [HIV] infection status: Secondary | ICD-10-CM | POA: Insufficient documentation

## 2023-03-22 LAB — CBC WITH DIFFERENTIAL/PLATELET
Abs Immature Granulocytes: 0.02 10*3/uL (ref 0.00–0.07)
Basophils Absolute: 0 10*3/uL (ref 0.0–0.1)
Basophils Relative: 0 %
Eosinophils Absolute: 0 10*3/uL (ref 0.0–0.5)
Eosinophils Relative: 0 %
HCT: 47.4 % (ref 39.0–52.0)
Hemoglobin: 15.6 g/dL (ref 13.0–17.0)
Immature Granulocytes: 0 %
Lymphocytes Relative: 15 %
Lymphs Abs: 1.5 10*3/uL (ref 0.7–4.0)
MCH: 32 pg (ref 26.0–34.0)
MCHC: 32.9 g/dL (ref 30.0–36.0)
MCV: 97.1 fL (ref 80.0–100.0)
Monocytes Absolute: 0.9 10*3/uL (ref 0.1–1.0)
Monocytes Relative: 9 %
Neutro Abs: 7.6 10*3/uL (ref 1.7–7.7)
Neutrophils Relative %: 76 %
Platelets: 216 10*3/uL (ref 150–400)
RBC: 4.88 MIL/uL (ref 4.22–5.81)
RDW: 12.5 % (ref 11.5–15.5)
WBC: 10.1 10*3/uL (ref 4.0–10.5)
nRBC: 0 % (ref 0.0–0.2)

## 2023-03-22 LAB — URINALYSIS, W/ REFLEX TO CULTURE (INFECTION SUSPECTED)
Bacteria, UA: NONE SEEN
Bilirubin Urine: NEGATIVE
Glucose, UA: NEGATIVE mg/dL
Hgb urine dipstick: NEGATIVE
Ketones, ur: 80 mg/dL — AB
Leukocytes,Ua: NEGATIVE
Nitrite: NEGATIVE
Protein, ur: NEGATIVE mg/dL
Specific Gravity, Urine: 1.011 (ref 1.005–1.030)
pH: 6 (ref 5.0–8.0)

## 2023-03-22 LAB — COMPREHENSIVE METABOLIC PANEL WITH GFR
ALT: 38 U/L (ref 0–44)
AST: 32 U/L (ref 15–41)
Albumin: 4.4 g/dL (ref 3.5–5.0)
Alkaline Phosphatase: 69 U/L (ref 38–126)
Anion gap: 13 (ref 5–15)
BUN: 11 mg/dL (ref 6–20)
CO2: 23 mmol/L (ref 22–32)
Calcium: 9.4 mg/dL (ref 8.9–10.3)
Chloride: 101 mmol/L (ref 98–111)
Creatinine, Ser: 1.01 mg/dL (ref 0.61–1.24)
GFR, Estimated: >60 mL/min (ref >60)
Glucose, Bld: 110 mg/dL — ABNORMAL HIGH (ref 70–99)
Potassium: 3.3 mmol/L — ABNORMAL LOW (ref 3.5–5.1)
Sodium: 137 mmol/L (ref 135–145)
Total Bilirubin: 3.3 mg/dL — ABNORMAL HIGH (ref 0.3–1.2)
Total Protein: 7.8 g/dL (ref 6.5–8.1)

## 2023-03-22 LAB — RAPID URINE DRUG SCREEN, HOSP PERFORMED
Amphetamines: NOT DETECTED
Barbiturates: NOT DETECTED
Benzodiazepines: NOT DETECTED
Cocaine: NOT DETECTED
Opiates: NOT DETECTED
Tetrahydrocannabinol: NOT DETECTED

## 2023-03-22 LAB — ETHANOL: Alcohol, Ethyl (B): <10 mg/dL (ref <10)

## 2023-03-22 MED ORDER — SODIUM CHLORIDE 0.9 % IV BOLUS
1000.0000 mL | Freq: Once | INTRAVENOUS | Status: AC
  Administered 2023-03-22: 1000 mL via INTRAVENOUS

## 2023-03-22 MED ORDER — POTASSIUM CHLORIDE CRYS ER 20 MEQ PO TBCR
40.0000 meq | EXTENDED_RELEASE_TABLET | Freq: Once | ORAL | Status: AC
  Administered 2023-03-22: 40 meq via ORAL
  Filled 2023-03-22: qty 2

## 2023-03-22 NOTE — Discharge Instructions (Addendum)
Return for any problem.   Drink plenty of fluids.  Make sure that you take your Keppra everyday to help prevent seizures.  Do not drive or operate heavy machinery.

## 2023-03-22 NOTE — ED Triage Notes (Signed)
Pt has been having questionable seizures, difficulty remembering things. Has difficulty answering questions in triage

## 2023-03-22 NOTE — ED Provider Notes (Signed)
[place] EMERGENCY DEPARTMENT AT [facility] Provider Note   CSN: [id number] Arrival date & time: 03/22/23  8295     History  Chief Complaint  Patient presents with   Altered Mental Status    Justin [name] is a 42 y.o. male.  42 year old male with prior medical history including HIV and seizure disorder presents for evaluation.  Patient is accompanied by friend.  Per patient and friend, patient recently "kicked out of" at the halfway house where he had been staying.  His friend has been allowing him to sleep in his [name] for the last 3 days.  Patient with possible seizure activity on Monday -2 days ago.  Per the patient's friend, the patient has been sleeping a significant amount for the last 2 days.  There is a question as to whether the patient has been fully compliant with previously prescribed Keppra.  Patient reports that he took his morning dose of Keppra - 1500 mg - today prior to coming to the ED.  It is unclear as to when his last dose of Keppra was before this morning.  Patient denies recent drug use.  Patient denies current complaint.  Patient is answering questions appropriately and appears to be quite comfortable.  Of note, patient seen recently for left eye corneal abrasion.  Patient reports that his eye is improving.  He reports compliance with recommended treatment for same.  The history is provided by the patient and medical records.       Home Medications Prior to Admission medications   Medication Sig Start Date End Date Taking? Authorizing Provider  acetaminophen (TYLENOL) 500 MG tablet Take 1,500 mg by mouth as needed for moderate pain.    [provider]  bictegravir-emtricitabine-tenofovir AF (BIKTARVY) 50-200-25 MG TABS tablet Take 1 tablet by mouth daily. 03/03/23   ComerBelia Heman, MD  erythromycin ophthalmic ointment Place into the left eye 4 (four) times daily. Place a 1/2 inch ribbon of ointment into the lower eyelid. 03/20/23    Lonell Grandchild, MD  hydrOXYzine (VISTARIL) 50 MG capsule Take 50 mg by mouth daily as needed for anxiety or itching. Patient not taking: Reported on 03/03/2023    [provider]  levETIRAcetam (KEPPRA) 750 MG tablet Take 2 tablets (1,500 mg total) by mouth 2 (two) times daily. 03/09/23   Comer, Belia Heman, MD  OLANZapine zydis (ZYPREXA) 5 MG disintegrating tablet Take 1 tablet (5 mg total) by mouth 2 (two) times daily. Patient not taking: Reported on 03/03/2023 01/12/23   Lewanda Rife, MD  oxyCODONE (ROXICODONE) 5 MG immediate release tablet Take 1 tablet (5 mg total) by mouth every 6 (six) hours as needed for severe pain. 03/20/23   Lonell Grandchild, MD  traZODone (DESYREL) 50 MG tablet Take 1 tablet (50 mg total) by mouth at bedtime as needed for sleep. Patient not taking: Reported on 03/03/2023 01/12/23   Lewanda Rife, MD      Allergies    Patient has no known allergies.    Review of Systems   Review of Systems  All other systems reviewed and are negative.   Physical Exam Updated Vital Signs BP (!) 130/97 (BP Location: Left Arm)   Pulse 82   Temp 98.5 F (36.9 C) (Oral)   Resp 16   Ht 6' (1.829 m)   Wt 101.5 kg   SpO2 100%   BMI 30.35 kg/m  Physical Exam Vitals and nursing note reviewed.  Constitutional:  General: He is not in acute distress.    Appearance: Normal appearance. He is well-developed.  HENT:     Head: Normocephalic and atraumatic.  Eyes:     Extraocular Movements: Extraocular movements intact.     Pupils: Pupils are equal, round, and reactive to light.     Comments: Injected left conjunctiva consistent with recently diagnosed corneal abrasion on left.  Cardiovascular:     Rate and Rhythm: Normal rate and regular rhythm.     Heart sounds: Normal heart sounds.  Pulmonary:     Effort: Pulmonary effort is normal. No respiratory distress.     Breath sounds: Normal breath sounds.  Abdominal:     General: There is no distension.      Palpations: Abdomen is soft.     Tenderness: There is no abdominal tenderness.  Musculoskeletal:        General: No deformity. Normal range of motion.     Cervical back: Normal range of motion and neck supple.  Skin:    General: Skin is warm and dry.  Neurological:     General: No focal deficit present.     Mental Status: He is alert and oriented to person, place, and time. Mental status is at baseline.     Cranial Nerves: No cranial nerve deficit.     Sensory: No sensory deficit.     Motor: No weakness.     Coordination: Coordination normal.     Gait: Gait normal.     ED Results / Procedures / Treatments   Labs (all labs ordered are listed, but only abnormal results are displayed) Labs Reviewed  CBC WITH DIFFERENTIAL/PLATELET  COMPREHENSIVE METABOLIC PANEL  ETHANOL  URINALYSIS, W/ REFLEX TO CULTURE (INFECTION SUSPECTED)  RAPID URINE DRUG SCREEN, HOSP PERFORMED    EKG None  Radiology No results found.  Procedures Procedures    Medications Ordered in ED Medications - No data to display  ED Course/ Medical Decision Making/ A&P                                 Medical Decision Making Amount and/or Complexity of Data Reviewed Labs: ordered. Radiology: ordered.  Risk Prescription drug management.    Medical Screen Complete  This patient presented to the ED with complaint of possible breakthrough seizure.  This complaint involves an extensive number of treatment options. The initial differential diagnosis includes, but is not limited to, metabolic abnormality, medication noncompliance, polysubstance use, etc.  This presentation is: Acute, Chronic, Self-Limited, Previously Undiagnosed, Uncertain Prognosis, Complicated, Systemic Symptoms, and Threat to Life/Bodily Function  Patient with longstanding history of seizure disorder presents after possible breakthrough seizure that occurred 2 days ago.  Patient appears to be comfortable on exam.  Patient is  reporting compliance with Keppra with most recent dose taken this morning prior to coming to the ED.  Patient without observed seizure-like activity here in the ED.  Screening labs on the whole are without significant abnormality.  Patient potassium was mildly decreased and was supplemented.  Patient with some ketones in his urine.  IV fluids were attempted until his IV infiltrated.  Patient understands need to drink plenty of fluids at home.  He declines repeat IV placement.  Importance of close follow-up is stressed.  Strict return precautions given and understood.  Additional history obtained:  External records from outside sources obtained and reviewed including prior ED visits and prior Inpatient records.  Lab Tests:  I ordered and personally interpreted labs.  The pertinent results include: CBC, CMP, UA, urine tox, EtOH   Imaging Studies ordered:  I ordered imaging studies including CT head I independently visualized and interpreted obtained imaging which showed NAD I agree with the radiologist interpretation.   Cardiac Monitoring:  The patient was maintained on a cardiac monitor.  I personally viewed and interpreted the cardiac monitor which showed an underlying rhythm of: NSR   Medicines ordered:  I ordered medication including potassium for hypokalemia Reevaluation of the patient after these medicines showed that the patient: improved    Problem List / ED Course:  Suspected breakthrough seizure   Reevaluation:  After the interventions noted above, I reevaluated the patient and found that they have: improved  Disposition:  After consideration of the diagnostic results and the patients response to treatment, I feel that the patent would benefit from close outpatient follow-up.          Final Clinical Impression(s) / ED Diagnoses Final diagnoses:  Seizure disorder [facility])    Rx / DC Orders ED Discharge Orders     None         Wynetta Fines, MD 03/22/23 1212

## 2023-03-23 ENCOUNTER — Other Ambulatory Visit: Payer: Self-pay

## 2023-03-23 ENCOUNTER — Encounter (HOSPITAL_COMMUNITY): Payer: Self-pay | Admitting: Emergency Medicine

## 2023-03-23 ENCOUNTER — Emergency Department (HOSPITAL_COMMUNITY): Payer: MEDICAID

## 2023-03-23 ENCOUNTER — Emergency Department (HOSPITAL_COMMUNITY)
Admission: EM | Admit: 2023-03-23 | Discharge: 2023-03-23 | Disposition: A | Discharge status: Home / Self Care | Payer: MEDICAID | Attending: Emergency Medicine | Admitting: Emergency Medicine

## 2023-03-23 ENCOUNTER — Other Ambulatory Visit: Payer: Self-pay | Admitting: Internal Medicine

## 2023-03-23 DIAGNOSIS — F17210 Nicotine dependence, cigarettes, uncomplicated: Secondary | ICD-10-CM | POA: Diagnosis not present

## 2023-03-23 DIAGNOSIS — Z21 Asymptomatic human immunodeficiency virus [HIV] infection status: Secondary | ICD-10-CM | POA: Insufficient documentation

## 2023-03-23 DIAGNOSIS — R569 Unspecified convulsions: Secondary | ICD-10-CM

## 2023-03-23 DIAGNOSIS — F323 Major depressive disorder, single episode, severe with psychotic features: Secondary | ICD-10-CM

## 2023-03-23 DIAGNOSIS — G40909 Epilepsy, unspecified, not intractable, without status epilepticus: Secondary | ICD-10-CM | POA: Diagnosis not present

## 2023-03-23 LAB — URINALYSIS, W/ REFLEX TO CULTURE (INFECTION SUSPECTED)
Bacteria, UA: NONE SEEN
Bilirubin Urine: NEGATIVE
Glucose, UA: NEGATIVE mg/dL
Hgb urine dipstick: NEGATIVE
Ketones, ur: 5 mg/dL — AB
Leukocytes,Ua: NEGATIVE
Nitrite: NEGATIVE
Protein, ur: NEGATIVE mg/dL
Specific Gravity, Urine: 1.03 (ref 1.005–1.030)
pH: 6 (ref 5.0–8.0)

## 2023-03-23 LAB — ETHANOL: Alcohol, Ethyl (B): <10 mg/dL (ref <10)

## 2023-03-23 LAB — COMPREHENSIVE METABOLIC PANEL
ALT: 36 U/L (ref 0–44)
AST: 34 U/L (ref 15–41)
Albumin: 4.1 g/dL (ref 3.5–5.0)
Alkaline Phosphatase: 63 U/L (ref 38–126)
Anion gap: 10 (ref 5–15)
BUN: 13 mg/dL (ref 6–20)
CO2: 25 mmol/L (ref 22–32)
Calcium: 9 mg/dL (ref 8.9–10.3)
Chloride: 107 mmol/L (ref 98–111)
Creatinine, Ser: 1 mg/dL (ref 0.61–1.24)
GFR, Estimated: >60 mL/min (ref >60)
Glucose, Bld: 108 mg/dL — ABNORMAL HIGH (ref 70–99)
Potassium: 3.3 mmol/L — ABNORMAL LOW (ref 3.5–5.1)
Sodium: 142 mmol/L (ref 135–145)
Total Bilirubin: 1.8 mg/dL — ABNORMAL HIGH (ref 0.3–1.2)
Total Protein: 7.4 g/dL (ref 6.5–8.1)

## 2023-03-23 LAB — CBC WITH DIFFERENTIAL/PLATELET
Abs Immature Granulocytes: 0.01 10*3/uL (ref 0.00–0.07)
Basophils Absolute: 0 10*3/uL (ref 0.0–0.1)
Basophils Relative: 1 %
Eosinophils Absolute: 0 10*3/uL (ref 0.0–0.5)
Eosinophils Relative: 1 %
HCT: 45.4 % (ref 39.0–52.0)
Hemoglobin: 15.1 g/dL (ref 13.0–17.0)
Immature Granulocytes: 0 %
Lymphocytes Relative: 25 %
Lymphs Abs: 1.5 10*3/uL (ref 0.7–4.0)
MCH: 32.2 pg (ref 26.0–34.0)
MCHC: 33.3 g/dL (ref 30.0–36.0)
MCV: 96.8 fL (ref 80.0–100.0)
Monocytes Absolute: 0.6 10*3/uL (ref 0.1–1.0)
Monocytes Relative: 11 %
Neutro Abs: 3.6 10*3/uL (ref 1.7–7.7)
Neutrophils Relative %: 62 %
Platelets: 208 10*3/uL (ref 150–400)
RBC: 4.69 MIL/uL (ref 4.22–5.81)
RDW: 12.6 % (ref 11.5–15.5)
WBC: 5.7 10*3/uL (ref 4.0–10.5)
nRBC: 0 % (ref 0.0–0.2)

## 2023-03-23 LAB — RAPID URINE DRUG SCREEN, HOSP PERFORMED
Amphetamines: NOT DETECTED
Barbiturates: NOT DETECTED
Benzodiazepines: NOT DETECTED
Cocaine: NOT DETECTED
Opiates: NOT DETECTED
Tetrahydrocannabinol: NOT DETECTED

## 2023-03-23 LAB — CBG MONITORING, ED: Glucose-Capillary: 128 mg/dL — ABNORMAL HIGH (ref 70–99)

## 2023-03-23 LAB — MAGNESIUM: Magnesium: 2.2 mg/dL (ref 1.7–2.4)

## 2023-03-23 MED ORDER — POTASSIUM CHLORIDE 20 MEQ PO PACK
40.0000 meq | PACK | Freq: Once | ORAL | Status: AC
  Administered 2023-03-23: 40 meq via ORAL
  Filled 2023-03-23: qty 2

## 2023-03-23 MED ORDER — SODIUM CHLORIDE 0.9 % IV BOLUS
1000.0000 mL | Freq: Once | INTRAVENOUS | Status: AC
  Administered 2023-03-23: 1000 mL via INTRAVENOUS

## 2023-03-23 MED ORDER — LEVETIRACETAM IN NACL 1500 MG/100ML IV SOLN
1500.0000 mg | Freq: Once | INTRAVENOUS | Status: AC
  Administered 2023-03-23: 1500 mg via INTRAVENOUS
  Filled 2023-03-23: qty 100

## 2023-03-23 MED ORDER — LEVETIRACETAM 1000 MG PO TABS: 1000.0000 mg | ORAL_TABLET | Freq: Two times a day (BID) | ORAL | 2 refills | Status: DC

## 2023-03-23 NOTE — Discharge Instructions (Addendum)
We evaluated you for your seizure.  We discussed your increased number of seizures with the neurologist who recommended increasing your Keppra to 1000 mg twice daily.  Please take this as prescribed.  We have also placed a referral to neurology.  Please call them to help facilitate your appointment.  Please return if you have any new or worsening symptoms such as ongoing or more frequent seizures, headaches, fevers or chills, abdominal pain, nausea or vomiting, or any other new symptoms.

## 2023-03-23 NOTE — ED Notes (Signed)
Please call Bruce to pick the patient up. His number is in the chart.

## 2023-03-23 NOTE — ED Provider Notes (Addendum)
[place] EMERGENCY DEPARTMENT AT [facility] Provider Note  CSN: [id number] Arrival date & time: 03/23/23 1458  Chief Complaint(s) Seizures  HPI Justin [name] is a 42 y.o. male with history of HIV, seizure disorder presenting to the emergency department with possible seizure.  Patient is not quite sure why he is here.  He reports he is taking his seizure medication but when I ask him what it is he does not remember.  He currently denies medical complaints like headaches, nausea or vomiting, fevers or chills abdominal pain, neck pain.  Patient was seen yesterday for possible seizure, review of chart shows there is maybe some question of compliance.  Keppra level was not sent.  Notably also I saw the patient around 4 days ago for eye injury after possible seizure, patient reports that his eyes feeling better and his vision is normal.   Past Medical History Past Medical History:  Diagnosis Date   Chronic hepatitis C without hepatic coma (HCC) 08/17/2017   Endocarditis    History of syphilis 08/25/2014   Treated by [facility] 07-2014.   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HCC)    Suicidal ideations 07/08/2020   Amphetamine use disorder, 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 Authorizing Provider  acetaminophen (TYLENOL) 500 MG tablet Take 1,500 mg by mouth as needed for moderate pain.    [provider]  bictegravir-emtricitabine-tenofovir AF (BIKTARVY) 50-200-25 MG TABS tablet Take 1 tablet by mouth daily. 03/03/23   ComerBelia Heman, MD  erythromycin ophthalmic ointment Place into the left eye 4 (four) times daily. Place a 1/2 inch ribbon of ointment into the lower eyelid. 03/20/23   Lonell Grandchild, MD  hydrOXYzine (VISTARIL) 50 MG capsule Take 50 mg by mouth daily as needed for anxiety or itching. Patient not taking: Reported on 03/03/2023    [provider]  levETIRAcetam (KEPPRA) 1000 MG tablet Take 1 tablet (1,000 mg total) by mouth 2 (two) times daily. 03/23/23   Lonell Grandchild, MD  OLANZapine zydis (ZYPREXA) 5 MG disintegrating tablet Take 1 tablet (5 mg total) by mouth 2 (two) times daily. Patient not taking: Reported on 03/03/2023 01/12/23   Lewanda Rife, MD  oxyCODONE (ROXICODONE) 5 MG immediate release tablet Take 1 tablet (5 mg total) by mouth every 6 (six) hours as needed for severe pain. 03/20/23   Lonell Grandchild, MD  traZODone (DESYREL) 50 MG tablet Take 1 tablet (50 mg total) by mouth at bedtime as needed for sleep. Patient not taking: Reported on 03/03/2023 01/12/23   Marval Regal,  Meenakshi, MD                                                                                                                                    Past Surgical History Past Surgical History:  Procedure Laterality Date   hand surgery Right    LUMBAR PUNCTURE  08/08/2019       WRIST SURGERY     Family History Family History  Adopted: Yes   Problem Relation Age of Onset   Diabetes type II Mother     Social History Social History   Tobacco Use   Smoking status: Every Day    Types: Cigarettes   Smokeless tobacco: Never  Vaping Use   Vaping status: Never Used  Substance Use Topics   Alcohol use: Not Currently    Comment: in rehab   Drug use: Yes    Types: Marijuana, Methamphetamines    Comment: states last meth use was 08/2021   Allergies Patient has no known allergies.  Review of Systems Review of Systems  All other systems reviewed and are negative.   Physical Exam Vital Signs  I have reviewed the triage vital signs BP (!) 157/96   Pulse 73   Temp 98.2 F (36.8 C) (Oral)   Resp 18   SpO2 98%  Physical Exam Vitals and nursing note reviewed.  Constitutional:      General: He is not in acute distress.    Appearance: Normal appearance.  HENT:     Head: Atraumatic.     Mouth/Throat:     Mouth: Mucous membranes are moist.     Comments: Tongue laceration to the left tongue, small, less than 0.5 cm Eyes:     Comments: Left eye conjunctival injection  Cardiovascular:     Rate and Rhythm: Normal rate and regular rhythm.  Pulmonary:     Effort: Pulmonary effort is normal. No respiratory distress.     Breath sounds: Normal breath sounds.  Abdominal:     General: Abdomen is flat.     Palpations: Abdomen is soft.     Tenderness: There is no abdominal tenderness.  Musculoskeletal:     Right lower leg: No edema.     Left lower leg: No edema.  Skin:    General: Skin is warm and dry.     Capillary Refill: Capillary refill takes less than 2 seconds.  Neurological:     Mental Status: He is alert.     Comments: Cranial nerves II through XII intact, strength 5 out of 5 in the bilateral upper and lower extremities, no sensory deficit to light touch, no dysmetria on finger-nose-finger testing.  Oriented to self, place.  Does not know why he is in the hospital and thinks it is 2021.  Psychiatric:        Mood  and Affect: Mood normal.        Behavior: Behavior normal.  ED Results and Treatments Labs (all labs ordered are listed, but only abnormal results are displayed) Labs Reviewed  COMPREHENSIVE METABOLIC PANEL - Abnormal; Notable for the following components:      Result Value   Potassium 3.3 (*)    Glucose, Bld 108 (*)    Total Bilirubin 1.8 (*)    All other components within normal limits  URINALYSIS, W/ REFLEX TO CULTURE (INFECTION SUSPECTED) - Abnormal; Notable for the following components:   Color, Urine AMBER (*)    Ketones, ur 5 (*)    All other components within normal limits  CBG MONITORING, ED - Abnormal; Notable for the following components:   Glucose-Capillary 128 (*)    All other components within normal limits  CBC WITH DIFFERENTIAL/PLATELET  MAGNESIUM  RAPID URINE DRUG SCREEN, HOSP PERFORMED  ETHANOL  LEVETIRACETAM LEVEL  CBG MONITORING, ED                                                                                                                          Radiology DG Chest Port 1 View  Result Date: 03/23/2023 CLINICAL DATA:  Cough. EXAM: PORTABLE CHEST 1 VIEW COMPARISON:  X-ray 01/04/2023 and older FINDINGS: The heart size and mediastinal contours are within normal limits. No consolidation, pneumothorax or effusion. No edema. The visualized skeletal structures are unremarkable. Overlapping cardiac leads. IMPRESSION: No acute cardiopulmonary disease. Electronically Signed   By: Karen Kays M.D.   On: 03/23/2023 17:53    Pertinent labs & imaging results that were available during my care of the patient were reviewed by me and considered in my medical decision making (see MDM for details).  Medications Ordered in ED Medications  sodium chloride 0.9 % bolus 1,000 mL (0 mLs Intravenous Stopped 03/23/23 1943)  levETIRAcetam (KEPPRA) IVPB 1500 mg/ 100 mL premix (0 mg Intravenous Stopped 03/23/23 1647)  potassium chloride (KLOR-CON) packet 40 mEq (40 mEq Oral Given  03/23/23 2021)                                                                                                                                     Procedures Procedures  (including critical care time)  Medical Decision Making / ED Course   MDM:  42 year old male presenting to the emergency department with possible seizure.  Patient does have known seizure disorder.  It appears he was here yesterday for seizure.  Triage note  reports that he is here for possible seizure.  No one else is at the bedside to provide additional history.  Patient is awake, answering questions appropriately for the most part although does seem mildly confused.  Neurologic exam is normal.  Does have signs of seizure such as tongue laceration.  Will check labs including CMP, magnesium, ethanol, urine drug screen, Keppra level to evaluate for seizure cause.  Seems there is some concern for noncompliance and patient has known seizure disorder.  He had a CT head yesterday which was unremarkable.  He has no external signs of head trauma, doubt repeating this would be beneficial.  Will continue to monitor for improvement in confusion.  Given now third presentation for seizure in the last few days will likely discussed with neurology.  Clinical Course as of 03/23/23 2238  Thu Mar 23, 2023  2227 Laboratory workup overall reassuring.  Patient's friend was here as well, witnessed seizures and reports generalized shaking activity lasting 30 to 45 seconds twice today with subsequent confusion.  Patient has returned to baseline.  Discussed increased seizure frequency with Dr. Otelia Limes.  He recommends increasing to 1000 mg of Keppra twice daily.  Discussed with the patient who is agreeable to this.  He has returned to baseline mental status.  He does report incidentally that he has some hallucinations.  He reports that he is not having any homicidal or suicidal ideation.  He is not reacting to internal stimuli and has linear train of  thought.  Advise follow-up with the behavioral health urgent care for this.  Discussed return if any worsening symptoms or have any thoughts of self-harm.  Will discharge patient to home. All questions answered. Patient comfortable with plan of discharge. Return precautions discussed with patient and specified on the after visit summary.  [WS]    Clinical Course User Index [WS] Suezanne Jacquet, Jerilee Field, MD     Additional history obtained: -Additional history obtained from friend -External records from outside source obtained and reviewed including: Chart review including previous notes, labs, imaging, consultation notes including previous ER notes   Lab Tests: -I ordered, reviewed, and interpreted labs.   The pertinent results include:   Labs Reviewed  COMPREHENSIVE METABOLIC PANEL - Abnormal; Notable for the following components:      Result Value   Potassium 3.3 (*)    Glucose, Bld 108 (*)    Total Bilirubin 1.8 (*)    All other components within normal limits  URINALYSIS, W/ REFLEX TO CULTURE (INFECTION SUSPECTED) - Abnormal; Notable for the following components:   Color, Urine AMBER (*)    Ketones, ur 5 (*)    All other components within normal limits  CBG MONITORING, ED - Abnormal; Notable for the following components:   Glucose-Capillary 128 (*)    All other components within normal limits  CBC WITH DIFFERENTIAL/PLATELET  MAGNESIUM  RAPID URINE DRUG SCREEN, HOSP PERFORMED  ETHANOL  LEVETIRACETAM LEVEL  CBG MONITORING, ED    Notable for overall reassuring lab results  EKG   EKG Interpretation Date/Time:  Thursday March 23 2023 15:01:53 EDT Ventricular Rate:  75 PR Interval:  140 QRS Duration:  102 QT Interval:  397 QTC Calculation: 444 R Axis:   -28  Text Interpretation: Sinus rhythm Left ventricular hypertrophy Confirmed by Alvino Blood ([id number]) on 03/23/2023 4:00:59 PM         Imaging Studies ordered: I ordered imaging studies including CXR On my  interpretation imaging demonstrates no acute process I independently visualized and  interpreted imaging. I agree with the radiologist interpretation   Medicines ordered and prescription drug management: Meds ordered this encounter  Medications   sodium chloride 0.9 % bolus 1,000 mL   levETIRAcetam (KEPPRA) IVPB 1500 mg/ 100 mL premix   potassium chloride (KLOR-CON) packet 40 mEq   levETIRAcetam (KEPPRA) 1000 MG tablet    Sig: Take 1 tablet (1,000 mg total) by mouth 2 (two) times daily.    Dispense:  60 tablet    Refill:  2    -I have reviewed the patients home medicines and have made adjustments as needed   Consultations Obtained: I requested consultation with the neurologist,  and discussed lab and imaging findings as well as pertinent plan - they recommend: increase keppra dosing   Cardiac Monitoring: The patient was maintained on a cardiac monitor.  I personally viewed and interpreted the cardiac monitored which showed an underlying rhythm of: NSR  Social Determinants of Health:  Diagnosis or treatment significantly limited by social determinants of health: obesity   Reevaluation: After the interventions noted above, I reevaluated the patient and found that their symptoms have resolved  Co morbidities that complicate the patient evaluation  Past Medical History:  Diagnosis Date   Chronic hepatitis C without hepatic coma (HCC) 08/17/2017   Endocarditis    History of syphilis 08/25/2014   Treated by [facility] 07-2014.   HIV (human immunodeficiency virus infection) (HCC)    Seizures (HCC)       Dispostion: Disposition decision including need for hospitalization was considered, and patient discharged from emergency department.    Final Clinical Impression(s) / ED Diagnoses Final diagnoses:  Seizure [facility])     This chart was dictated using voice recognition software.  Despite best efforts to proofread,  errors can occur which can change the documentation meaning.     Lonell Grandchild, MD 03/23/23 2229    Lonell Grandchild, MD 03/23/23 2238

## 2023-03-23 NOTE — ED Triage Notes (Signed)
Patient presents post seizure. He is unable to give any information.

## 2023-03-23 NOTE — Telephone Encounter (Signed)
Please advise. Not listed in active medication list

## 2023-03-25 ENCOUNTER — Encounter (HOSPITAL_COMMUNITY): Payer: Self-pay

## 2023-03-25 ENCOUNTER — Other Ambulatory Visit: Payer: Self-pay

## 2023-03-25 ENCOUNTER — Emergency Department (HOSPITAL_COMMUNITY)
Admission: EM | Admit: 2023-03-25 | Discharge: 2023-03-25 | Discharge status: Against Medical Advice | Payer: MEDICAID | Attending: Physician Assistant | Admitting: Physician Assistant

## 2023-03-25 DIAGNOSIS — Z5321 Procedure and treatment not carried out due to patient leaving prior to being seen by health care provider: Secondary | ICD-10-CM | POA: Diagnosis not present

## 2023-03-25 DIAGNOSIS — R569 Unspecified convulsions: Secondary | ICD-10-CM | POA: Diagnosis present

## 2023-03-25 MED ORDER — LEVETIRACETAM 500 MG PO TABS
1000.0000 mg | ORAL_TABLET | Freq: Once | ORAL | Status: AC
  Administered 2023-03-25: 1000 mg via ORAL
  Filled 2023-03-25: qty 2
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
## 2023-03-25 NOTE — ED Notes (Addendum)
Pt escorted to triage by [employer].

## 2023-03-25 NOTE — ED Notes (Signed)
Called patient x2 for triage- no answer. Will try again in few minutes.

## 2023-03-25 NOTE — ED Notes (Signed)
Pt in the lobby yelling and causing distractions. Pt has been asked to settle down by security.

## 2023-03-25 NOTE — ED Notes (Signed)
Pt called by patient access x3 with no response.

## 2023-03-26 ENCOUNTER — Emergency Department (HOSPITAL_COMMUNITY)
Admission: EM | Admit: 2023-03-26 | Discharge: 2023-03-26 | Discharge status: Against Medical Advice | Payer: MEDICAID | Source: Home / Self Care | Attending: Emergency Medicine | Admitting: Emergency Medicine

## 2023-03-26 ENCOUNTER — Other Ambulatory Visit: Payer: Self-pay

## 2023-03-26 ENCOUNTER — Emergency Department (HOSPITAL_COMMUNITY)
Admission: EM | Admit: 2023-03-26 | Discharge: 2023-03-26 | Disposition: A | Discharge status: Home / Self Care | Payer: MEDICAID | Attending: Emergency Medicine | Admitting: Emergency Medicine

## 2023-03-26 ENCOUNTER — Encounter (HOSPITAL_COMMUNITY): Payer: Self-pay

## 2023-03-26 ENCOUNTER — Emergency Department (HOSPITAL_COMMUNITY)
Admission: EM | Admit: 2023-03-26 | Discharge: 2023-03-26 | Disposition: A | Discharge status: Home / Self Care | Payer: MEDICAID | Source: Home / Self Care | Attending: Emergency Medicine | Admitting: Emergency Medicine

## 2023-03-26 DIAGNOSIS — R569 Unspecified convulsions: Secondary | ICD-10-CM | POA: Insufficient documentation

## 2023-03-26 DIAGNOSIS — B20 Human immunodeficiency virus [HIV] disease: Secondary | ICD-10-CM | POA: Insufficient documentation

## 2023-03-26 DIAGNOSIS — G40909 Epilepsy, unspecified, not intractable, without status epilepticus: Secondary | ICD-10-CM | POA: Insufficient documentation

## 2023-03-26 DIAGNOSIS — Z5321 Procedure and treatment not carried out due to patient leaving prior to being seen by health care provider: Secondary | ICD-10-CM | POA: Insufficient documentation

## 2023-03-26 LAB — BASIC METABOLIC PANEL
Anion gap: 16 — ABNORMAL HIGH (ref 5–15)
BUN: 18 mg/dL (ref 6–20)
CO2: 19 mmol/L — ABNORMAL LOW (ref 22–32)
Calcium: 9 mg/dL (ref 8.9–10.3)
Chloride: 100 mmol/L (ref 98–111)
Creatinine, Ser: 1.26 mg/dL — ABNORMAL HIGH (ref 0.61–1.24)
GFR, Estimated: >60 mL/min (ref >60)
Glucose, Bld: 90 mg/dL (ref 70–99)
Potassium: 3 mmol/L — ABNORMAL LOW (ref 3.5–5.1)
Sodium: 135 mmol/L (ref 135–145)

## 2023-03-26 LAB — CBC WITH DIFFERENTIAL/PLATELET
Abs Immature Granulocytes: 0.02 10*3/uL (ref 0.00–0.07)
Basophils Absolute: 0 10*3/uL (ref 0.0–0.1)
Basophils Relative: 1 %
Eosinophils Absolute: 0.1 10*3/uL (ref 0.0–0.5)
Eosinophils Relative: 2 %
HCT: 46.8 % (ref 39.0–52.0)
Hemoglobin: 16 g/dL (ref 13.0–17.0)
Immature Granulocytes: 0 %
Lymphocytes Relative: 37 %
Lymphs Abs: 2.8 10*3/uL (ref 0.7–4.0)
MCH: 31.6 pg (ref 26.0–34.0)
MCHC: 34.2 g/dL (ref 30.0–36.0)
MCV: 92.5 fL (ref 80.0–100.0)
Monocytes Absolute: 0.8 10*3/uL (ref 0.1–1.0)
Monocytes Relative: 11 %
Neutro Abs: 3.6 10*3/uL (ref 1.7–7.7)
Neutrophils Relative %: 49 %
Platelets: 233 10*3/uL (ref 150–400)
RBC: 5.06 MIL/uL (ref 4.22–5.81)
RDW: 12.2 % (ref 11.5–15.5)
WBC: 7.4 10*3/uL (ref 4.0–10.5)
nRBC: 0 % (ref 0.0–0.2)

## 2023-03-26 LAB — MAGNESIUM: Magnesium: 1.8 mg/dL (ref 1.7–2.4)

## 2023-03-26 LAB — CBG MONITORING, ED: Glucose-Capillary: 86 mg/dL (ref 70–99)

## 2023-03-26 MED ORDER — LEVETIRACETAM IN NACL 1000 MG/100ML IV SOLN
1000.0000 mg | Freq: Once | INTRAVENOUS | Status: AC
  Administered 2023-03-26: 1000 mg via INTRAVENOUS
  Filled 2023-03-26: qty 100

## 2023-03-26 MED ORDER — LEVETIRACETAM IN NACL 1000 MG/100ML IV SOLN: 1000.0000 mg | Freq: Once | INTRAVENOUS | Status: DC

## 2023-03-26 NOTE — ED Notes (Signed)
Pt left AMA and without dc VS

## 2023-03-26 NOTE — ED Triage Notes (Signed)
Pt states, "I'm an epileptic and I need help, I don't have any medicine." Pt is in triage talking in word salad.

## 2023-03-26 NOTE — ED Notes (Signed)
Got up off the stretcher and said he didn't want to be seen anymore and left.

## 2023-03-26 NOTE — ED Notes (Signed)
Called pt to take back to triage, no answer.  Checked all 3 bathrooms in the waiting room, pt not in the bathrooms.  Notified triage nurse.

## 2023-03-26 NOTE — ED Notes (Signed)
I went to get pt from triage room to take him to hall bed and pt was playing on computer. I told pt to get off computer and that he is not allowed on there.  Pt started walking in circles in room picking up random things. I told pt to come on and screamed wait, so I called security to come help with pt

## 2023-03-26 NOTE — ED Triage Notes (Signed)
Pt states, "I'm an epileptic and I need help, I don't have any medicine

## 2023-03-26 NOTE — ED Notes (Signed)
Called pt x2 , no answer.

## 2023-03-26 NOTE — ED Notes (Signed)
Called pt x3 for triage, no answer.  Notified triage nurse.

## 2023-03-26 NOTE — ED Provider Notes (Signed)
[place] EMERGENCY DEPARTMENT AT [facility] Provider Note   CSN: [id number] Arrival date & time: 03/26/23  1219     History  No chief complaint on file.  HPI Justin [name] is a 42 y.o. male with seizures, HIV, amphetamine use disorder, chronic hepatitis C presenting for concern for possible seizure.  States he woke up on his floor at his house about a couple of hours ago.  Does not remember what happened.  States he feels like he may have bitten his tongue.  Denies any preceding chest pain, shortness of breath.  Denies headache or visual disturbance at this time.  Reports a left eye injury that has been there since prior evaluation for seizure.  Patient also mention that he has not been taking his Keppra for the last 2 or 3 days at least.  Requesting a refill if possible.  HPI     Home Medications Prior to Admission medications   Medication Sig Start Date End Date Taking? Authorizing Provider  acetaminophen (TYLENOL) 500 MG tablet Take 1,500 mg by mouth as needed for moderate pain.    [provider]  bictegravir-emtricitabine-tenofovir AF (BIKTARVY) 50-200-25 MG TABS tablet Take 1 tablet by mouth daily. 03/03/23   ComerBelia Heman, MD  erythromycin ophthalmic ointment Place into the left eye 4 (four) times daily. Place a 1/2 inch ribbon of ointment into the lower eyelid. 03/20/23   Lonell Grandchild, MD  hydrOXYzine (VISTARIL) 50 MG capsule Take 50 mg by mouth daily as needed for anxiety or itching. Patient not taking: Reported on 03/03/2023    [provider]  levETIRAcetam (KEPPRA) 1000 MG tablet Take 1 tablet (1,000 mg total) by mouth 2 (two) times daily. 03/23/23   Lonell Grandchild, MD  OLANZapine zydis (ZYPREXA) 5 MG disintegrating tablet Take 1 tablet (5 mg total) by mouth 2 (two) times daily. Patient not taking: Reported on 03/03/2023 01/12/23   Lewanda Rife, MD  oxyCODONE (ROXICODONE) 5 MG immediate release tablet Take 1 tablet (5 mg total)  by mouth every 6 (six) hours as needed for severe pain. 03/20/23   Lonell Grandchild, MD  traZODone (DESYREL) 50 MG tablet Take 1 tablet (50 mg total) by mouth at bedtime as needed for sleep. Patient not taking: Reported on 03/03/2023 01/12/23   Lewanda Rife, MD      Allergies    Patient has no known allergies.    Review of Systems   See HPI for pertinent positives   Physical Exam  There were no vitals filed for this visit.  CONSTITUTIONAL:  well-appearing, NAD NEURO:  GCS 15. Speech is goal oriented. No deficits appreciated to CN III-XII; symmetric eyebrow raise, no facial drooping, tongue midline. Patient has equal grip strength bilaterally with 5/5 strength against resistance in all major muscle groups bilaterally. Sensation to light touch intact. Patient moves extremities without ataxia. Normal finger-nose-finger. Patient ambulatory with steady gait. EYES:  eyes equal and reactive, left ENT/NECK:  Supple, no stridor, tongue biting noted CARDIO:  Regular rate and rhythm, appears well-perfused  PULM:  No respiratory distress, CTAB GI/GU:  non-distended, soft MSK/SPINE:  No gross deformities, no edema, moves all extremities  SKIN:  no rash, atraumatic   *Additional and/or pertinent findings included in MDM below    ED Results / Procedures / Treatments   Labs (all labs ordered are listed, but only abnormal results are displayed) Labs Reviewed  CBG MONITORING, ED    EKG EKG Interpretation Date/Time:  Sunday March 26 2023 14:19:49 EDT Ventricular Rate:  83 PR Interval:  132 QRS Duration:  98 QT Interval:  392 QTC Calculation: 460 R Axis:   -14  Text Interpretation: Normal sinus rhythm with sinus arrhythmia Minimal voltage criteria for LVH, may be normal variant ( R in aVL ) Borderline ECG No significant change since last tracing Confirmed by Elayne Snare ([id number]) on 03/26/2023 2:22:44 PM  Radiology No results found.  Procedures Procedures    Medications  Ordered in ED Medications  levETIRAcetam (KEPPRA) IVPB 1000 mg/100 mL premix (0 mg Intravenous Stopped 03/26/23 1442)    ED Course/ Medical Decision Making/ A&P                                 Medical Decision Making Risk Prescription drug management.   42 year old well-appearing presenting for concern for seizure.  Exam notable for tongue biting and conjunctival injection and right eye. DDx includes seizures, stroke, intracranial mass, arrhythmia, electrolyte derangement, other.  Overall patient looks clinically well, no acute distress hemodynamically stable.  Suspect he may have had a seizure earlier today and likely due to not taking his Keppra in the last few days.  Checked his blood sugar which was normal.  Loaded with IV Keppra.  Plan was to observe for a couple of hours and reassess if no seizure or seizure-like activity then likely could be discharged with follow-up with his PCP who normally prescribes his Keppra.  Patient left AMA.  I did see in his med list that he does have Keppra that was sent to his pharmacy a couple days ago with refills.        Final Clinical Impression(s) / ED Diagnoses Final diagnoses:  Seizure [facility])    Rx / DC Orders ED Discharge Orders     None         Gareth Eagle, PA-C 03/26/23 1456    Elayne Snare K, DO 03/26/23 1531

## 2023-03-26 NOTE — ED Notes (Signed)
IV removed. Pt left before receiving D/C papers

## 2023-03-26 NOTE — ED Notes (Addendum)
This is the 3rd or 4th visit to ED in past 24 hours and patient walked out approximately 30-45 minutes ago after evaluation had started by MD. Now checked in again for same complaint

## 2023-03-27 LAB — LEVETIRACETAM LEVEL: Levetiracetam Lvl: 25.9 ug/mL (ref 10.0–40.0)

## 2023-03-29 ENCOUNTER — Emergency Department (HOSPITAL_COMMUNITY)
Admission: EM | Admit: 2023-03-29 | Discharge: 2023-03-30 | Disposition: A | Discharge status: Home / Self Care | Payer: MEDICAID | Attending: Emergency Medicine | Admitting: Emergency Medicine

## 2023-03-29 ENCOUNTER — Encounter (HOSPITAL_COMMUNITY): Payer: Self-pay | Admitting: Emergency Medicine

## 2023-03-29 ENCOUNTER — Emergency Department (HOSPITAL_COMMUNITY): Payer: MEDICAID

## 2023-03-29 ENCOUNTER — Other Ambulatory Visit: Payer: Self-pay

## 2023-03-29 DIAGNOSIS — R443 Hallucinations, unspecified: Secondary | ICD-10-CM

## 2023-03-29 DIAGNOSIS — Z5900 Homelessness unspecified: Secondary | ICD-10-CM | POA: Diagnosis not present

## 2023-03-29 DIAGNOSIS — F1220 Cannabis dependence, uncomplicated: Secondary | ICD-10-CM | POA: Diagnosis not present

## 2023-03-29 DIAGNOSIS — G40909 Epilepsy, unspecified, not intractable, without status epilepticus: Secondary | ICD-10-CM

## 2023-03-29 DIAGNOSIS — F1520 Other stimulant dependence, uncomplicated: Secondary | ICD-10-CM | POA: Insufficient documentation

## 2023-03-29 DIAGNOSIS — Z765 Malingerer [conscious simulation]: Secondary | ICD-10-CM | POA: Insufficient documentation

## 2023-03-29 DIAGNOSIS — Z72811 Adult antisocial behavior: Secondary | ICD-10-CM | POA: Insufficient documentation

## 2023-03-29 DIAGNOSIS — F17210 Nicotine dependence, cigarettes, uncomplicated: Secondary | ICD-10-CM | POA: Diagnosis not present

## 2023-03-29 DIAGNOSIS — R440 Auditory hallucinations: Secondary | ICD-10-CM | POA: Insufficient documentation

## 2023-03-29 DIAGNOSIS — Z21 Asymptomatic human immunodeficiency virus [HIV] infection status: Secondary | ICD-10-CM | POA: Diagnosis not present

## 2023-03-29 DIAGNOSIS — F333 Major depressive disorder, recurrent, severe with psychotic symptoms: Secondary | ICD-10-CM | POA: Insufficient documentation

## 2023-03-29 DIAGNOSIS — Z659 Problem related to unspecified psychosocial circumstances: Secondary | ICD-10-CM

## 2023-03-29 DIAGNOSIS — Z91148 Patient's other noncompliance with medication regimen for other reason: Secondary | ICD-10-CM | POA: Diagnosis not present

## 2023-03-29 LAB — CBC
HCT: 46.6 % (ref 39.0–52.0)
Hemoglobin: 15.5 g/dL (ref 13.0–17.0)
MCH: 31.5 pg (ref 26.0–34.0)
MCHC: 33.3 g/dL (ref 30.0–36.0)
MCV: 94.7 fL (ref 80.0–100.0)
Platelets: 231 10*3/uL (ref 150–400)
RBC: 4.92 MIL/uL (ref 4.22–5.81)
RDW: 12 % (ref 11.5–15.5)
WBC: 5.6 10*3/uL (ref 4.0–10.5)
nRBC: 0 % (ref 0.0–0.2)

## 2023-03-29 LAB — COMPREHENSIVE METABOLIC PANEL
ALT: 41 U/L (ref 0–44)
AST: 37 U/L (ref 15–41)
Albumin: 4.1 g/dL (ref 3.5–5.0)
Alkaline Phosphatase: 66 U/L (ref 38–126)
Anion gap: 12 (ref 5–15)
BUN: 8 mg/dL (ref 6–20)
CO2: 28 mmol/L (ref 22–32)
Calcium: 9.5 mg/dL (ref 8.9–10.3)
Chloride: 99 mmol/L (ref 98–111)
Creatinine, Ser: 1.07 mg/dL (ref 0.61–1.24)
GFR, Estimated: >60 mL/min (ref >60)
Glucose, Bld: 97 mg/dL (ref 70–99)
Potassium: 3.5 mmol/L (ref 3.5–5.1)
Sodium: 139 mmol/L (ref 135–145)
Total Bilirubin: 2 mg/dL — ABNORMAL HIGH (ref 0.3–1.2)
Total Protein: 7.1 g/dL (ref 6.5–8.1)

## 2023-03-29 LAB — RAPID URINE DRUG SCREEN, HOSP PERFORMED
Amphetamines: NOT DETECTED
Barbiturates: NOT DETECTED
Benzodiazepines: NOT DETECTED
Cocaine: NOT DETECTED
Opiates: NOT DETECTED
Tetrahydrocannabinol: POSITIVE — AB

## 2023-03-29 LAB — ACETAMINOPHEN LEVEL: Acetaminophen (Tylenol), Serum: <10 ug/mL — ABNORMAL LOW (ref 10–30)

## 2023-03-29 LAB — CBG MONITORING, ED: Glucose-Capillary: 150 mg/dL — ABNORMAL HIGH (ref 70–99)

## 2023-03-29 LAB — SALICYLATE LEVEL: Salicylate Lvl: <7 mg/dL — ABNORMAL LOW (ref 7.0–30.0)

## 2023-03-29 LAB — ETHANOL: Alcohol, Ethyl (B): <10 mg/dL (ref <10)

## 2023-03-29 MED ORDER — OLANZAPINE 5 MG PO TBDP
5.0000 mg | ORAL_TABLET | Freq: Two times a day (BID) | ORAL | Status: DC
  Administered 2023-03-29: 5 mg via ORAL
  Filled 2023-03-29 (×2): qty 1

## 2023-03-29 MED ORDER — OLANZAPINE 5 MG PO TBDP
5.0000 mg | ORAL_TABLET | Freq: Two times a day (BID) | ORAL | Status: DC
  Administered 2023-03-29 – 2023-03-30 (×2): 5 mg via ORAL
  Filled 2023-03-29 (×2): qty 1

## 2023-03-29 MED ORDER — ACETAMINOPHEN 500 MG PO TABS: 1000.0000 mg | ORAL_TABLET | ORAL | Status: DC | PRN

## 2023-03-29 MED ORDER — BICTEGRAVIR-EMTRICITAB-TENOFOV 50-200-25 MG PO TABS
1.0000 | ORAL_TABLET | Freq: Every day | ORAL | Status: DC
  Administered 2023-03-30: 1 via ORAL
  Filled 2023-03-29 (×3): qty 1

## 2023-03-29 MED ORDER — LEVETIRACETAM 500 MG PO TABS
1000.0000 mg | ORAL_TABLET | Freq: Two times a day (BID) | ORAL | Status: DC
  Administered 2023-03-29 – 2023-03-30 (×2): 1000 mg via ORAL
  Filled 2023-03-29 (×2): qty 2

## 2023-03-29 MED ORDER — HYDROXYZINE HCL 50 MG PO TABS: 50.0000 mg | ORAL_TABLET | Freq: Every day | ORAL | Status: DC | PRN

## 2023-03-29 MED ORDER — OLANZAPINE 5 MG PO TBDP: 5.0000 mg | ORAL_TABLET | Freq: Every day | ORAL | Status: DC

## 2023-03-29 MED ORDER — ACETAMINOPHEN 500 MG PO TABS
1000.0000 mg | ORAL_TABLET | Freq: Once | ORAL | Status: AC
  Administered 2023-03-29: 1000 mg via ORAL
  Filled 2023-03-29: qty 2

## 2023-03-29 MED ORDER — LEVETIRACETAM 500 MG PO TABS
1000.0000 mg | ORAL_TABLET | Freq: Once | ORAL | Status: AC
  Administered 2023-03-29: 1000 mg via ORAL
  Filled 2023-03-29: qty 2

## 2023-03-29 MED ORDER — ACETAMINOPHEN 500 MG PO TABS: 1000.0000 mg | ORAL_TABLET | Freq: Four times a day (QID) | ORAL | Status: DC | PRN

## 2023-03-29 NOTE — Progress Notes (Signed)
Justin [name] is here because he is unable to get his Keppra from the pharmacy. Reports that he misplaced it and it is too soon to refill. He says it's been a few days since he's had his Keppra.   Also reports hearing voices which started about 5 days ago, but states they're more casual conversation and denies hearing any voices that mention harming others or himself. Reports that he last used methamphetamine about 8 days ago and that he notices a correlation in using meth and hearing voices. He has not been taking his Zyprexa. He is currently unhoused, [facility] provided resources regarding shelters and [employer].  Due to concerns for seizures without Keppra and onset of hearing voices he is agreeable to go to the ED for med refills and psych eval/stabilization. Justin [name] was escorted to the ED by case manager Johaura with [facility].   Sandie Ano, RN

## 2023-03-29 NOTE — ED Notes (Addendum)
Pt changed into scrubs, belongings removed from pt.  Pt belongings placed in locker 1 in purple zone. Pt and RN aware.

## 2023-03-29 NOTE — ED Notes (Addendum)
BH NP notified this RN that pt reported that something wasn't right medically. Assessed pt and pt more alert than he has been, but pt reporting slurred speech, trouble concentrating, eyes feeling heavy. CBG to be obtained. Performed NIHSS and notified EDP of concerns regarding pt. This RN noted L facial droop, slurred speech. Pt w/ hx of sz. Asked pt if he has any auras when he has seizures and pt denies auras.

## 2023-03-29 NOTE — ED Notes (Signed)
Pt calm and cooperative at this time. Security called to bedside to wand pt.

## 2023-03-29 NOTE — ED Notes (Signed)
Sitter at bedside.

## 2023-03-29 NOTE — ED Provider Notes (Signed)
  Physical Exam  BP 119/81 (BP Location: Right Arm)   Pulse 73   Temp 98.1 F (36.7 C) (Oral)   Resp 15   Ht 6' (1.829 m)   Wt 101.2 kg   SpO2 100%   BMI 30.26 kg/m   Physical Exam  Procedures  Procedures  ED Course / MDM   Clinical Course as of 03/29/23 2258  Wed Mar 29, 2023  1139 Laboratory testing overall reassuring.  Patient medically clear for psychiatric evaluation.  If patient is cleared by psychiatry he would benefit from [facility] consult for medication assistance. [WS]    Clinical Course User Index [WS] Lonell Grandchild, MD   Medical Decision Making I was called by the nurse regarding this patient.  Patient had a seizure earlier and was more altered.  CT head was performed and was unremarkable.  I just reassessed patient and he is awake and alert.  He has no slurred speech.  At this point he is medically clear for psych eval.  Amount and/or Complexity of Data Reviewed Labs: ordered.  Risk OTC drugs. Prescription drug management.          Charlynne Pander, MD 03/29/23 [phone]

## 2023-03-29 NOTE — ED Notes (Addendum)
Pt transported to CT 3 by this RN and tech

## 2023-03-29 NOTE — ED Provider Notes (Addendum)
[place] EMERGENCY DEPARTMENT AT [facility] Provider Note  CSN: [id number] Arrival date & time: 03/29/23 0865  Chief Complaint(s) Hallucinations  HPI Justin [name] is a 42 y.o. male history of hepatitis C, HIV, seizure disorder presenting to the emergency department with medication concern.  Patient reports that he has been having more seizures lately, has not been able to pick up his Keppra from the pharmacy due to insurance issues.  He also reports he has not been taking his Zyprexa.  He reports that he has had increased auditory hallucinations which have also been interfering with his ability to take his medications.  He denies any suicidal, homicidal ideation, drug use, ingestion.  Has just been having frequent seizures.  Was previously staying with a friend in a [name] but friend asked him to leave as he was having frequent seizures.  Had recent eye injury from seizure which is improving.  Denies complaints of this at this time.    Past Medical History Past Medical History:  Diagnosis Date   Chronic hepatitis C without hepatic coma (HCC) 08/17/2017   Endocarditis    History of syphilis 08/25/2014   Treated by [facility] 07-2014.   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HCC)    Suicidal ideations 07/08/2020   Amphetamine use disorder, 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 Authorizing Provider  acetaminophen (TYLENOL) 500 MG tablet Take 1,500 mg by mouth as needed for moderate pain.    [provider]  bictegravir-emtricitabine-tenofovir AF (BIKTARVY) 50-200-25 MG TABS tablet Take 1 tablet by mouth daily. 03/03/23   ComerBelia Heman, MD  erythromycin ophthalmic ointment Place into the left eye 4 (four) times daily. Place a 1/2 inch ribbon of ointment into the lower eyelid. 03/20/23   Lonell Grandchild, MD  hydrOXYzine (VISTARIL) 50 MG capsule Take 50 mg by mouth daily as needed for anxiety or itching. Patient not taking: Reported on 03/03/2023    [provider]  levETIRAcetam (KEPPRA) 1000 MG tablet Take 1 tablet (1,000 mg total) by mouth 2 (two) times daily. 03/23/23   Lonell Grandchild, MD  OLANZapine zydis (ZYPREXA) 5 MG disintegrating tablet Take 1 tablet (5 mg total) by mouth 2 (two) times daily. Patient not taking: Reported on 03/03/2023 01/12/23   Lewanda Rife, MD  oxyCODONE (ROXICODONE) 5 MG immediate release tablet Take 1 tablet (5 mg total) by mouth every 6 (six) hours as needed for severe pain. 03/20/23   Lonell Grandchild, MD  traZODone (DESYREL) 50 MG tablet Take 1 tablet (50 mg total) by mouth  at bedtime as needed for sleep. Patient not taking: Reported on 03/03/2023 01/12/23   Lewanda Rife, MD                                                                                                                                    Past Surgical History Past Surgical History:  Procedure Laterality Date   hand surgery Right    LUMBAR  PUNCTURE  08/08/2019       WRIST SURGERY     Family History Family History  Adopted: Yes  Problem Relation Age of Onset   Diabetes type II Mother     Social History Social History   Tobacco Use   Smoking status: Every Day    Types: Cigarettes   Smokeless tobacco: Never  Vaping Use   Vaping status: Never Used  Substance Use Topics   Alcohol use: Yes    Comment: occ   Drug use: Yes    Types: Marijuana, Methamphetamines   Allergies Patient has no known allergies.  Review of Systems Review of Systems  All other systems reviewed and are negative.   Physical Exam Vital Signs  I have reviewed the triage vital signs BP 133/79 (BP Location: Right Arm)   Pulse 99   Temp 98.4 F (36.9 C) (Oral)   Resp 17   Ht 6' (1.829 m)   Wt 101.2 kg   SpO2 98%   BMI 30.26 kg/m  Physical Exam Vitals and nursing note reviewed.  Constitutional:      General: He is not in acute distress.    Appearance: Normal appearance.  HENT:     Mouth/Throat:     Mouth: Mucous membranes are moist.  Eyes:     Conjunctiva/sclera: Conjunctivae normal.  Cardiovascular:     Rate and Rhythm: Normal rate and regular rhythm.  Pulmonary:     Effort: Pulmonary effort is normal. No respiratory distress.     Breath sounds: Normal breath sounds.  Abdominal:     General: Abdomen is flat.     Palpations: Abdomen is soft.     Tenderness: There is no abdominal tenderness.  Musculoskeletal:     Right lower leg: No edema.     Left lower leg: No edema.  Skin:    General: Skin is warm and dry.     Capillary Refill: Capillary refill takes less than 2 seconds.  Neurological:     Mental Status: He is alert and oriented to person, place, and time. Mental status is at baseline.     Comments: Endorsing audio hallucinations, possible slight reaction to internal stimuli but overall with linear train of thought.  Denies suicidal or homicidal ideation.  Psychiatric:        Mood and Affect: Mood normal.         Behavior: Behavior normal.     ED Results and Treatments Labs (all labs ordered are listed, but only abnormal results are  displayed) Labs Reviewed  COMPREHENSIVE METABOLIC PANEL - Abnormal; Notable for the following components:      Result Value   Total Bilirubin 2.0 (*)    All other components within normal limits  SALICYLATE LEVEL - Abnormal; Notable for the following components:   Salicylate Lvl <7.0 (*)    All other components within normal limits  ACETAMINOPHEN LEVEL - Abnormal; Notable for the following components:   Acetaminophen (Tylenol), Serum <10 (*)    All other components within normal limits  RAPID URINE DRUG SCREEN, HOSP PERFORMED - Abnormal; Notable for the following components:   Tetrahydrocannabinol POSITIVE (*)    All other components within normal limits  ETHANOL  CBC                                                                                                                          Radiology No results found.  Pertinent labs & imaging results that were available during my care of the patient were reviewed by me and considered in my medical decision making (see MDM for details).  Medications Ordered in ED Medications  OLANZapine zydis (ZYPREXA) disintegrating tablet 5 mg (5 mg Oral Given 03/29/23 1026)  levETIRAcetam (KEPPRA) tablet 1,000 mg (1,000 mg Oral Given 03/29/23 1026)  acetaminophen (TYLENOL) tablet 1,000 mg (1,000 mg Oral Given 03/29/23 1039)                                                                                                                                     Procedures Procedures  (including critical care time)  Medical Decision Making / ED Course   MDM:  42 year old male presenting to the emergency department with medication concerning hallucinations.  Patient is overall very well-appearing, overall with linear train of thought.  May be slightly reacting to normal stimuli.  Denies command hallucinations, suicidal or  homicidal ideation.  Review of records show patient has been seen now multiple times for recent seizures including 3 days ago.  I did see the patient twice last week for seizures.  His Keppra was increased but apparently he was not able to go and pick this up.  He is help seeking and seems like he would like to restart his medications including his Keppra and Zyprexa.  He reports that his hallucinations are interfering with this.  It seems that his mental health is interfering with his ability to care for  himself.  Was previously staying with a friend but now homeless.  He thinks that it would be helpful for him to talk to his psychiatrist.  Will place TTS.  He is help seeking and not floridly psychotic so will not IVC patient.  If he is cleared by TTS can get social work and  pharmacy to help provide medications for him  Clinical Course as of 03/29/23 1141  Wed Mar 29, 2023  1139 Laboratory testing overall reassuring.  Patient medically clear for psychiatric evaluation.  If patient is cleared by psychiatry he would benefit from [facility] consult for medication assistance. [WS]    Clinical Course User Index [WS] Lonell Grandchild, MD     Additional history obtained: -Additional history obtained from ems -External records from outside source obtained and reviewed including: Chart review including previous notes, labs, imaging, consultation notes including prior ER notes   Lab Tests: -I ordered, reviewed, and interpreted labs.   The pertinent results include:   Labs Reviewed  COMPREHENSIVE METABOLIC PANEL - Abnormal; Notable for the following components:      Result Value   Total Bilirubin 2.0 (*)    All other components within normal limits  SALICYLATE LEVEL - Abnormal; Notable for the following components:   Salicylate Lvl <7.0 (*)    All other components within normal limits  ACETAMINOPHEN LEVEL - Abnormal; Notable for the following components:   Acetaminophen (Tylenol), Serum <10 (*)     All other components within normal limits  RAPID URINE DRUG SCREEN, HOSP PERFORMED - Abnormal; Notable for the following components:   Tetrahydrocannabinol POSITIVE (*)    All other components within normal limits  ETHANOL  CBC    Notable for reassuring results  EKG   EKG Interpretation Date/Time:  Wednesday March 29 2023 10:14:43 EDT Ventricular Rate:  84 PR Interval:  140 QRS Duration:  96 QT Interval:  390 QTC Calculation: 460 R Axis:   -22  Text Interpretation: Normal sinus rhythm with sinus arrhythmia Normal ECG Confirmed by Alvino Blood ([id number]) on 03/29/2023 10:21:59 AM          Medicines ordered and prescription drug management: Meds ordered this encounter  Medications   levETIRAcetam (KEPPRA) tablet 1,000 mg   DISCONTD: OLANZapine zydis (ZYPREXA) disintegrating tablet 5 mg   OLANZapine zydis (ZYPREXA) disintegrating tablet 5 mg   acetaminophen (TYLENOL) tablet 1,000 mg    -I have reviewed the patients home medicines and have made adjustments as needed   Social Determinants of Health:  Diagnosis or treatment significantly limited by social determinants of health: polysubstance abuse and homelessness   Reevaluation: After the interventions noted above, I reevaluated the patient and found that their symptoms have improved  Co morbidities that complicate the patient evaluation  Past Medical History:  Diagnosis Date   Chronic hepatitis C without hepatic coma (HCC) 08/17/2017   Endocarditis    History of syphilis 08/25/2014   Treated by [facility] 07-2014.   HIV (human immunodeficiency virus infection) (HCC)    Seizures (HCC)       Dispostion: Disposition decision including need for hospitalization was considered, and patient medically cleared pending psychiatric evaluation    Final Clinical Impression(s) / ED Diagnoses Final diagnoses:  Hallucinations  Medication nonadherence due to psychosocial problem     This chart was dictated using voice  recognition software.  Despite best efforts to proofread,  errors can occur which can change the documentation meaning.    Lonell Grandchild, MD 03/29/23 1141  Lonell Grandchild, MD 03/29/23 1141

## 2023-03-29 NOTE — ED Notes (Signed)
No sitter to replace current sitter

## 2023-03-29 NOTE — ED Notes (Signed)
BH NP at bedside at this time

## 2023-03-29 NOTE — ED Notes (Signed)
Pt back from CT at this time

## 2023-03-29 NOTE — ED Triage Notes (Signed)
Patient presents for concern for auditory hallucinations that have been worsening over the last month. Patient states that he is supposed to take Zyprexa twice daily but has been having difficulty obtaining a new prescription so hasn't taken it in approximately one month. Patient reports history of epilepsy and states that he hasn't taken his medications in approximately 3-4 days due to his auditory hallucinations. Patient denies SI/HI. Patient denies command hallucinations. Patient is AAOx4; speaking in clear, full sentences.

## 2023-03-29 NOTE — ED Notes (Signed)
Per staffing, no sitter available at this time.

## 2023-03-30 ENCOUNTER — Other Ambulatory Visit (HOSPITAL_COMMUNITY): Payer: Self-pay

## 2023-03-30 ENCOUNTER — Encounter (HOSPITAL_COMMUNITY): Payer: Self-pay | Admitting: Psychiatry

## 2023-03-30 DIAGNOSIS — F1520 Other stimulant dependence, uncomplicated: Secondary | ICD-10-CM

## 2023-03-30 DIAGNOSIS — Z5900 Homelessness unspecified: Secondary | ICD-10-CM

## 2023-03-30 DIAGNOSIS — F1220 Cannabis dependence, uncomplicated: Secondary | ICD-10-CM

## 2023-03-30 DIAGNOSIS — Z72811 Adult antisocial behavior: Secondary | ICD-10-CM

## 2023-03-30 DIAGNOSIS — F333 Major depressive disorder, recurrent, severe with psychotic symptoms: Secondary | ICD-10-CM

## 2023-03-30 DIAGNOSIS — Z765 Malingerer [conscious simulation]: Secondary | ICD-10-CM

## 2023-03-30 MED ORDER — OLANZAPINE 5 MG PO TABS
5.0000 mg | ORAL_TABLET | Freq: Two times a day (BID) | ORAL | 0 refills | Status: DC
Start: 2023-03-30 — End: 2023-05-02
  Filled 2023-03-30: qty 60, 30d supply, fill #0

## 2023-03-30 MED ORDER — ESCITALOPRAM OXALATE 10 MG PO TABS
10.0000 mg | ORAL_TABLET | Freq: Every day | ORAL | Status: DC
  Administered 2023-03-30: 10 mg via ORAL
  Filled 2023-03-30: qty 1

## 2023-03-30 MED ORDER — BIKTARVY 50-200-25 MG PO TABS
1.0000 | ORAL_TABLET | Freq: Every day | ORAL | 0 refills | Status: DC
  Filled 2023-03-30 – 2023-04-03 (×2): qty 30, 30d supply, fill #0

## 2023-03-30 MED ORDER — LEVETIRACETAM 1000 MG PO TABS
1000.0000 mg | ORAL_TABLET | Freq: Two times a day (BID) | ORAL | 0 refills | Status: DC
Start: 2023-03-30 — End: 2023-05-07
  Filled 2023-03-30: qty 60, 30d supply, fill #0

## 2023-03-30 MED ORDER — ESCITALOPRAM OXALATE 10 MG PO TABS
10.0000 mg | ORAL_TABLET | Freq: Every day | ORAL | 0 refills | Status: DC
  Filled 2023-03-30: qty 30, 30d supply, fill #0

## 2023-03-30 MED ORDER — OLANZAPINE 5 MG PO TBDP: 5.0000 mg | ORAL_TABLET | Freq: Two times a day (BID) | ORAL | 0 refills | Status: DC

## 2023-03-30 NOTE — ED Notes (Signed)
Pt at desk using phone to call sober living as recommended by NP.

## 2023-03-30 NOTE — ED Notes (Signed)
Pt cleared for DC at this time by psych and ED MD. DC papers received along with resources and safety plan attached. Medications received from pharmacy and 2 bus passes provided to assist pt to get to pharmacy and shelter of choice. Pt verbalized understanding of plan denies any SI/HI at time of dc.

## 2023-03-30 NOTE — Discharge Planning (Signed)
[employer] Medication Assistance Card *Pharmacies please call [phone] for claim processing assistance Name: Justin [name]                                                                                                                                                                                         Relationship Code:  1 ID (MRN): [id number]                                                                                                                                                                                  Person Code:  01 Bin: [id number] RX Group: [id number] Discharge Date: 03/30/23                                 RX PCN:  [id number] Expiration Date:04/06/23                                           (must be filled within 7 days of discharge)     You have been approved to have the prescriptions written by your discharging physician filled through our [facility] (Medication Assistance Through [facility]) program. This program allows for a one-time (no refills) 34-day supply of selected medications for a low copay amount.  The copay is $0 per prescription.   Only certain pharmacies are participating in this program with [facility]. You will need to select one of the pharmacies from the attached list and take your prescriptions, this letter, and your photo ID to one of  the [facility] pharmacies.  We are excited that you are able to use the [facility] program to get your medications. These prescriptions must  be filled within 7 days of hospital discharge or they will no longer be valid for the [facility] program. Should you have any problems with your prescriptions please contact your case management team member at [phone] for [place]/ [facility].  Thank you,   [facility] Health Care Management

## 2023-03-30 NOTE — ED Provider Notes (Addendum)
Emergency Medicine Observation Re-evaluation Note  Justin [name] is a 42 y.o. male, seen on rounds today.  Pt initially presented to the ED for complaints of Hallucinations Currently, the patient is talking to psychiatry provider.  Physical Exam  BP 100/76 (BP Location: Right Arm)   Pulse 70   Temp 97.8 F (36.6 C) (Oral)   Resp 18   Ht 6' (1.829 m)   Wt 101.2 kg   SpO2 98%   BMI 30.26 kg/m  Physical Exam General: No acute distress Cardiac: Normal rate Lungs: No increased work of breathing Psych: Calm  ED Course / MDM  EKG:EKG Interpretation Date/Time:  Wednesday March 29 2023 10:14:43 EDT Ventricular Rate:  84 PR Interval:  140 QRS Duration:  96 QT Interval:  390 QTC Calculation: 460 R Axis:   -22  Text Interpretation: Normal sinus rhythm with sinus arrhythmia Normal ECG Confirmed by Alvino Blood ([id number]) on 03/29/2023 10:21:59 AM  I have reviewed the labs performed to date as well as medications administered while in observation.  Recent changes in the last 24 hours include none.  Plan  Current plan is for psychiatric assessment which is currently being done.  12:09 patient was cleared for discharge by psychiatry.  Have prescriptions for his medications sent to the transition of care pharmacy.  Outpatient resource guide was given.    Rolan Bucco, MD 03/30/23 6045    Rolan Bucco, MD 03/30/23 1210

## 2023-03-30 NOTE — ED Notes (Signed)
Psych NP at bedside evaluating patient.

## 2023-03-30 NOTE — BH Assessment (Signed)
TTS Consult attempted. 2300 Per Dr. Elesa Hacker, attempt made, patient too sleepy.  4098 Per Tobi Bastos, RN, patient too sleepy, will stay awake.

## 2023-03-30 NOTE — Consult Note (Addendum)
BH ED ASSESSMENT   Reason for Consult: Psych Consult, "hallucinations" Referring Physician:  Lonell Grandchild, MD  Patient Identification: Justin [name] MRN:  [id number] ED Chief Complaint: Malingering  Diagnosis:  Principal Problem:   Malingering Active Problems:   Amphetamine use disorder, severe (HCC)   MDD (major depressive disorder), recurrent, severe, with psychosis (HCC)   Homelessness   Cannabis use disorder, severe, dependence (HCC)   Adult antisocial behavior   ED Assessment Time Calculation: Start Time: 0800 Stop Time: 0838 Total Time in Minutes (Assessment Completion): 38   Subjective:    Justin [name] is a 42 y.o. AA male with a past psychiatric history of polysubstance abuse (though primarily amphetamine use), cannabis use disorder, substance-induced mood disorder/psychosis, altered mental status, severe amphetamine use disorder, MDD with and/or without psychotic features, and malingering, with pertinent medical comorbidities/history that include hepatitis C, HIV, and seizure disorder, and additional past and pertinent psychiatric history of multiple emergency department and hospital encounters for, and/or the combination of, noncompliance with medications, housing instability, substance abuse, and endorsements of decompensation of the patient's mental health, who presents this encounter by way of self due to endorsements of a recrudescence of auditory hallucinations x 5 days and or possibly x 1 month, noncompliance with medications due to reportedly not having them and a need for refills, recent meth amphetamine use 8 days ago, and housing instability. Patient currently is voluntary and medically cleared per EDP team.  Patient is known to this writer, last appreciably assessed and was recommended for inpatient on 01/18/2023 at the [facility] emergency department.  Patient is additionally well-known to the behavioral health service line.  HPI:   Patient seen today  at the [facility] emergency department for face-to-face psychiatric evaluation.   Upon evaluation, patient tells me that he continues to struggle in life, states that he remains homeless, tells me that he has been homeless since he started using methamphetamines and cannabis 3 years ago (incongruent to 01/18/2023 evaluation where he stated 10 year history; also incongruent to chart review), and states that he continues to experience none of his family willing to talk to him anymore because of his drug use, states "I burned all my bridges".   Patient endorses that because of his homelessness and housing instability, states that he "constantly" keeps losing his medications for his seizure disorder that he states is very serious, as well as loses his medications for his "schizophrenia", states that this is the reason for his encounter this visit by way of self, states that he has been having a recrudescence of auditory hallucinations for "a while now, I honestly forget". Patient endorses he continues to not utilize outpatient mental health resources, but states that he has been compliant with following up with his HIV clinic and receiving his medications for HIV, last saw his infectious disease provider on 03/03/2023 per chart review which is congruent to endorsements.   Patient able to clarify he has pocession of his HIV medications, just has problems he states with seizure medications and his "schizophrenia" medications being lost and or stolen from other homeless people. Patient endorses no EtOH use recently, but does endorse occasional use, "when I am able to get money together that I don't use for drugs". BAL 0. Patient endorses that he has not used amphetamines in, "probably a year now", then when confronted about endorsements of amphetamine use 8 days ago to nursing, as well as on 01/17/2023 UDS screening positive for amphetamines just prior to evaluation  by this provider, states last time he used  amphetamines was 8 days ago, states that he relapsed. Patient also endorses daily cannabis use and tobacco use, states he smokes a pack a day, "when I can". UDS notably positive for cannabis this encounter.   Patient endorses that his mood is "frustrated" and "I am trying to not be depressed" due to his housing instability, but states that it is difficult, states that, "life is not easy living out on the streets". Patient endorses that from living out on the streets and using drugs, states that he very frequently sleeps very poorly, eats very sporadically but has good appetite, and is "almost constantly" on edge.  Patient endorses that since he has been in the hospital he has been eating and sleeping very well. Patient endorses that despite psychosocial stressors, endorses today, as well as upon presentation this encounter, he has not been having suicidal or homicidal ideations. Patient endorses that he has never attempted suicide, nor has he ever attempted self injures behavior (incongruent to chart review which reflects on 01/05/2023 the patient attempted overdose on prescribed Keppra). Patient endorses that he remains not working, states that he has "given up" on trying to pick up odd jobs as of lately, states primary focus right now is housing and staying on his medications he needs to maintain his health.    Expanding on the patient's endorsements of having "schizophrenia", patient endorses that he has a long history of this, states that this is the reason why he is on olanzapine, states that he believes today he needs inpatient hospitalization and or residential substance abuse for this reason (states he uses drugs d/t mental health problems), expands further stating that he believes he needs help with long-term housing to stop, "this cycle" of presenting to the hospital repeatedly for medication noncompliance, recrudescence's of auditory hallucinations, paranoia, and depression (chart review shows no  evidence of documentation for schizophrenia; does have history of MDD w/ psychosis however and substance induced psychosis).   Patient endorses during our engagement that he is not experiencing auditory and or visual hallucinations, states though while he does experience paranoia, and hearing and seeing things with amphetamine use, states that he also has begun to hear voices that are, "like people are having a casual conversation", but also endorses that sometimes they are negative in nature, though notably not command in nature. Patient orientation was intact upon assessment, no concerns for fluctuations in consciousness, and/or concerns for seizure activity.  Patient objectively does not present with psychotic features and/or appear to be responding to internal stimuli.  Patient interpersonal style is euthymic.  Patient does not present with ideas of reference, appreciable delusional themes, and/or paranoid ideations.  Discussed restarting his previous medications that have been effective for him and tolerable of Lexapro and olanzapine for discharge, to which patient endorsed he was amenable to this.  Past Psychiatric History: As above in HPI/subjective   Risk to Self or Others: Is the patient at risk to self? No Has the patient been a risk to self in the past 6 months? Yes Has the patient been a risk to self within the distant past? No Is the patient a risk to others? No Has the patient been a risk to others in the past 6 months? No Has the patient been a risk to others within the distant past? No  [place] Scale:  Flowsheet Row ED from 03/29/2023 in [facility] Emergency Department at [facility] ED from 03/26/2023 in [facility] ED from 03/25/2023 in [facility] Emergency Department at [facility]  C-SSRS RISK CATEGORY No Risk No Risk No Risk       Substance Abuse: Today endorses 3-year history of methamphetamine and pot use,  denies any other drug use (chart review reflects use of other substances including opiates), endorses daily tobacco use, endorses occasional EtOH use; from 01/18/2023 assessment from this provider, endorsed 10-year history of amphetamine use.   Past Medical History:  Past Medical History:  Diagnosis Date   Chronic hepatitis C without hepatic coma (HCC) 08/17/2017   Endocarditis    History of syphilis 08/25/2014   Treated by [facility] 07-2014.   HIV (human immunodeficiency virus infection) (HCC)    Seizures (HCC)     Past Surgical History:  Procedure Laterality Date   hand surgery Right    LUMBAR PUNCTURE  08/08/2019       WRIST SURGERY     Family History:  Family History  Adopted: Yes  Problem Relation Age of Onset   Diabetes type II Mother    Family Psychiatric  History: None endorsed today; 01/18/2023 visit notably endorsed history of mother using crack cocaine Social History:  Social History   Substance and Sexual Activity  Alcohol Use Yes   Comment: occ     Social History   Substance and Sexual Activity  Drug Use Yes   Types: Marijuana, Methamphetamines    Social History   Socioeconomic History   Marital status: Single    Spouse name: Not on file   Number of children: Not on file   Years of education: Not on file   Highest education level: Not on file  Occupational History   Not on file  Tobacco Use   Smoking status: Every Day    Types: Cigarettes   Smokeless tobacco: Never  Vaping Use   Vaping status: Never Used  Substance and Sexual Activity   Alcohol use: Yes    Comment: occ   Drug use: Yes    Types: Marijuana, Methamphetamines   Sexual activity: Not Currently    Partners: Male    Birth control/protection: Condom    Comment: given condoms  Other Topics Concern   Not on file  Social History Narrative   ** Merged History Encounter **       ** Merged History Encounter **       ** Merged History Encounter **       ** Merged History Encounter **        Social Determinants of Health   Financial Resource Strain: Not on file  Food Insecurity: Food Insecurity Present (01/07/2023)   Hunger Vital Sign    Worried About Running Out of Food in the Last Year: Sometimes true    Ran Out of Food in the Last Year: Sometimes true  Transportation Needs: Unmet Transportation Needs (01/07/2023)   PRAPARE - Administrator, Civil Service (Medical): Yes    Lack of Transportation (Non-Medical): Yes  Physical Activity: Not on file  Stress: Not on file  Social Connections: Not on file   Additional Social History:    Allergies:  No Known Allergies  Labs:  Results for orders placed or performed during the hospital encounter of 03/29/23 (from the past 48 hour(s))  Comprehensive metabolic panel     Status: Abnormal   Collection Time: 03/29/23 10:14 AM  Result Value Ref Range   Sodium 139 135 - 145 mmol/L  Potassium 3.5 3.5 - 5.1 mmol/L   Chloride 99 98 - 111 mmol/L   CO2 28 22 - 32 mmol/L   Glucose, Bld 97 70 - 99 mg/dL    Comment: Glucose reference range applies only to samples taken after fasting for at least 8 hours.   BUN 8 6 - 20 mg/dL   Creatinine, Ser 0.86 0.61 - 1.24 mg/dL   Calcium 9.5 8.9 - 57.8 mg/dL   Total Protein 7.1 6.5 - 8.1 g/dL   Albumin 4.1 3.5 - 5.0 g/dL   AST 37 15 - 41 U/L   ALT 41 0 - 44 U/L   Alkaline Phosphatase 66 38 - 126 U/L   Total Bilirubin 2.0 (H) 0.3 - 1.2 mg/dL   GFR, Estimated >46 >96 mL/min    Comment: (NOTE) Calculated using the CKD-EPI Creatinine Equation (2021)    Anion gap 12 5 - 15    Comment: Performed at [facility], [address] [place], [place] [address]  Ethanol     Status: None   Collection Time: 03/29/23 10:14 AM  Result Value Ref Range   Alcohol, Ethyl (B) <10 <10 mg/dL    Comment: (NOTE) Lowest detectable limit for serum alcohol is 10 mg/dL.  For medical purposes only. Performed at [facility], [address] [place], [place] [address]   Salicylate level      Status: Abnormal   Collection Time: 03/29/23 10:14 AM  Result Value Ref Range   Salicylate Lvl <7.0 (L) 7.0 - 30.0 mg/dL    Comment: Performed at [facility], [address], [place], [place] [address]  Acetaminophen level     Status: Abnormal   Collection Time: 03/29/23 10:14 AM  Result Value Ref Range   Acetaminophen (Tylenol), Serum <10 (L) 10 - 30 ug/mL    Comment: (NOTE) Therapeutic concentrations vary significantly. A range of 10-30 ug/mL  may be an effective concentration for many patients. However, some  are best treated at concentrations outside of this range. Acetaminophen concentrations >150 ug/mL at 4 hours after ingestion  and >50 ug/mL at 12 hours after ingestion are often associated with  toxic reactions.  Performed at [facility], [address], [place], [place] [address]   cbc     Status: None   Collection Time: 03/29/23 10:14 AM  Result Value Ref Range   WBC 5.6 4.0 - 10.5 K/uL   RBC 4.92 4.22 - 5.81 MIL/uL   Hemoglobin 15.5 13.0 - 17.0 g/dL   HCT 64.4 03.4 - 74.2 %   MCV 94.7 80.0 - 100.0 fL   MCH 31.5 26.0 - 34.0 pg   MCHC 33.3 30.0 - 36.0 g/dL   RDW 59.5 63.8 - 75.6 %   Platelets 231 150 - 400 K/uL   nRBC 0.0 0.0 - 0.2 %    Comment: Performed at [facility], [address], [place], [place] [address]  Rapid urine drug screen (hospital performed)     Status: Abnormal   Collection Time: 03/29/23 10:36 AM  Result Value Ref Range   Opiates NONE DETECTED NONE DETECTED   Cocaine NONE DETECTED NONE DETECTED   Benzodiazepines NONE DETECTED NONE DETECTED   Amphetamines NONE DETECTED NONE DETECTED   Tetrahydrocannabinol POSITIVE (A) NONE DETECTED   Barbiturates NONE DETECTED NONE DETECTED    Comment: (NOTE) DRUG SCREEN FOR MEDICAL PURPOSES ONLY.  IF CONFIRMATION IS NEEDED FOR ANY PURPOSE, NOTIFY LAB WITHIN 5 DAYS.  LOWEST DETECTABLE  LIMITS FOR URINE DRUG SCREEN Drug Class                     Cutoff (ng/mL) Amphetamine and  metabolites    1000 Barbiturate and metabolites    200 Benzodiazepine                 200 Opiates and metabolites        300 Cocaine and metabolites        300 THC                            50 Performed at [facility], [address], [place], [place] [address]   CBG monitoring, ED     Status: Abnormal   Collection Time: 03/29/23  3:48 PM  Result Value Ref Range   Glucose-Capillary 150 (H) 70 - 99 mg/dL    Comment: Glucose reference range applies only to samples taken after fasting for at least 8 hours.    Current Facility-Administered Medications  Medication Dose Route Frequency Provider Last Rate Last Admin   acetaminophen (TYLENOL) tablet 1,000 mg  1,000 mg Oral Q6H PRN Fayrene Helper, PA-C       bictegravir-emtricitabine-tenofovir AF (BIKTARVY) 50-200-25 MG per tablet 1 tablet  1 tablet Oral Daily Charlynne Pander, MD       hydrOXYzine (ATARAX) tablet 50 mg  50 mg Oral Daily PRN Charlynne Pander, MD       levETIRAcetam (KEPPRA) tablet 1,000 mg  1,000 mg Oral BID Charlynne Pander, MD   1,000 mg at 03/30/23 0931   OLANZapine zydis (ZYPREXA) disintegrating tablet 5 mg  5 mg Oral BID Charlynne Pander, MD   5 mg at 03/30/23 6045   Current Outpatient Medications  Medication Sig Dispense Refill   acetaminophen (TYLENOL) 500 MG tablet Take 1,500 mg by mouth as needed for moderate pain.     bictegravir-emtricitabine-tenofovir AF (BIKTARVY) 50-200-25 MG TABS tablet Take 1 tablet by mouth daily. 30 tablet 11   erythromycin ophthalmic ointment Place into the left eye 4 (four) times daily. Place a 1/2 inch ribbon of ointment into the lower eyelid. 3.5 g 0   hydrOXYzine (VISTARIL) 50 MG capsule Take 50 mg by mouth daily as needed for anxiety or itching.     levETIRAcetam (KEPPRA) 1000 MG tablet Take 1 tablet (1,000 mg total) by mouth 2 (two) times daily. 60 tablet 2   OLANZapine zydis (ZYPREXA) 5 MG disintegrating tablet Take 1 tablet (5 mg total) by mouth 2 (two) times daily. 60  tablet 0   oxyCODONE (ROXICODONE) 5 MG immediate release tablet Take 1 tablet (5 mg total) by mouth every 6 (six) hours as needed for severe pain. 5 tablet 0    Musculoskeletal: Strength & Muscle Tone: within normal limits Gait & Station: normal Patient leans: N/A   Psychiatric Specialty Exam: Presentation  General Appearance:  Appropriate for Environment  Eye Contact: Good  Speech: Clear and Coherent; Normal Rate  Speech Volume: Normal  Handedness: Right   Mood and Affect  Mood: Depressed  Affect: Depressed   Thought Process  Thought Processes: Linear; Goal Directed; Coherent  Descriptions of Associations:Intact  Orientation:Full (Time, Place and Person)  Thought Content:Logical  History of Schizophrenia/Schizoaffective disorder:No  Duration of Psychotic Symptoms:Less than six months (In context of amphetamine use)  Hallucinations:Hallucinations: Other (comment) (Denies currently)  Ideas of Reference:None  Suicidal Thoughts:Suicidal Thoughts: No  Homicidal Thoughts:Homicidal Thoughts:  No   Sensorium  Memory: Immediate Fair; Recent Fair; Remote Fair  Judgment: Intact  Insight: Present   Executive Functions  Concentration: Fair  Attention Span: Fair  Recall: [employer] of Knowledge: Fair  Language: Fair   Psychomotor Activity  Psychomotor Activity: Psychomotor Activity: Normal   Assets  Assets: Desire for Improvement; Communication Skills; Leisure Time; Resilience; Talents/Skills    Sleep  Sleep: Sleep: Poor   Physical Exam: Physical Exam Vitals and nursing note reviewed.  Constitutional:      General: He is not in acute distress.    Appearance: He is normal weight. He is not ill-appearing, toxic-appearing or diaphoretic.  Pulmonary:     Effort: Pulmonary effort is normal.  Skin:    General: Skin is warm and dry.  Neurological:     Mental Status: He is alert and oriented to person, place, and time.      Motor: No tremor or seizure activity.  Psychiatric:        Attention and Perception: Attention and perception normal. He does not perceive auditory or visual hallucinations.        Mood and Affect: Mood is depressed.        Speech: Speech normal.        Behavior: Behavior normal. Behavior is cooperative.        Thought Content: Thought content is not paranoid or delusional. Thought content does not include homicidal or suicidal ideation.        Cognition and Memory: Cognition and memory normal.    Review of Systems  Psychiatric/Behavioral:  Positive for depression and substance abuse (Amphetamines and cannabis). Negative for hallucinations and suicidal ideas. The patient has insomnia. The patient is not nervous/anxious.   All other systems reviewed and are negative.  Blood pressure 100/76, pulse 70, temperature 97.8 F (36.6 C), temperature source Oral, resp. rate 18, height 6' (1.829 m), weight 101.2 kg, SpO2 98%. Body mass index is 30.26 kg/m.  Medical Decision Making:  Patient presents this encounter by way of self due to endorsements of a recrudescence of auditory hallucinations x 5 days and or possibly x 1 month, noncompliance with medications due to reportedly not having them and a need for refills, recent meth amphetamine use 8 days ago, and housing instability. Upon evaluation, patient on numerous accounts is incongruent to previous statements, chart review, and endorsements, giving clinical suspicion today largely for malingering and antisocial behavior, in the context of housing instability and homelessness.    Patient endorses a long history of schizophrenia, but on chart review, there is no such documentation of this, nor is there any evidence today of psychotic features and/or symptomology consistent with schizophrenia. Patient does however present with symptomology congruent with unipolar major depression, difficulties with substance abuse i.e. primarily amphetamines and cannabis,  and exhibits behavior in the context of attempting to have his needs meet through the safe and secure environment of the hospital i.e. food, shelter, housing.   Discussed with patient that he does not meet inpatient requirements, and that the recommendation today was to be discharged to local shelter, with [facility] consult to be placed for refills of his medications and a bus pass. Patient endorsed he was amenable to this but was not happy about this. Patient also encouraged to reach out to [facility] [place] for interview, as he has stated that he would like residential substance abuse housing, housing in general, and or inpatient hospitalization, but upon interview at the nursing station over the phone,  reports to nursing he was declined d/t telling them he had schizophrenia and the facility not accepting schizophrenic patients. Reiterated to patient again that that my evaluation has never concluded that he has schizophrenia.  Discussed with patient that resources for substance abuse and outpatient mental health would be given upon discharge, endorsed he was amenable to this.  Spoke with Dr. Lucianne Muss who agrees with plan of care and recommendation for discharge.   Recommendations-psychiatrically cleared  #Malingering #Amphetamine use disorder, severe (HCC) #MDD (major depressive disorder), recurrent, severe, with psychosis (HCC) #Homelessness #Cannabis use disorder, severe, dependence (HCC) #Adult antisocial behavior  -Recommend discharge with outpatient mental health resources for medication management therapy -Recommend outpatient/inpatient substance abuse resources to be given upon discharge -Recommend discharge to [facility] shelter -Recommend [facility] consult for bus pass -Recommend to continue outpatient psychiatric medications -Recommend [facility] consult for medication refills i.e. Lexapro and olanzapine  Safety Plan WILFRID MAGUIRE will call 911 or call mobile crisis, or go to nearest emergency room  if condition worsens or if suicidal thoughts become appreciable. Patients' will follow up with [facility] for outpatient psychiatric services (therapy/medication management) and will be given substance abuse resources to contact.  The suicide prevention education provided includes the following: Suicide risk factors Suicide prevention and interventions National Suicide Hotline telephone number [facility] assessment telephone number [facility] Emergency Assistance 911 [facility] and/or Residential Mobile Crisis Unit telephone number  Disposition: No evidence of imminent risk to self or others at present.   Patient does not meet criteria for psychiatric inpatient admission. Supportive therapy provided about ongoing stressors. Discussed crisis plan, support from social network, calling 911, coming to the Emergency Department, and calling Suicide Hotline.  Lenox Ponds, NP 03/30/2023 9:49 AM

## 2023-03-30 NOTE — ED Notes (Signed)
Medications received from pharmacy for discharge.

## 2023-03-30 NOTE — ED Notes (Signed)
Pt ambulated to bathroom with steady gait, still awaiting safety sitter at this time.

## 2023-03-30 NOTE — TOC Benefit Eligibility Note (Signed)
Pharmacy Patient Advocate Encounter  Insurance verification completed.    The patient is insured through  xxx  [employer] card for Justin [name]      [id number] PCN    ACCESS GRP   [id number] ID       [id number]  Customer Service for help with [facility], or other issues is [phone] (M-F 8AM TO 8PM EST)

## 2023-03-30 NOTE — ED Notes (Signed)
Pt ambulated to bathroom and is showering. Pt provided with clean change of safety scrubs, socks, and mesh underwear.

## 2023-03-30 NOTE — ED Notes (Signed)
Pt received all belongings as documented.

## 2023-04-01 ENCOUNTER — Emergency Department (HOSPITAL_COMMUNITY)
Admission: EM | Admit: 2023-04-01 | Discharge: 2023-04-02 | Disposition: A | Discharge status: Home / Self Care | Payer: MEDICAID | Attending: Emergency Medicine | Admitting: Emergency Medicine

## 2023-04-01 ENCOUNTER — Other Ambulatory Visit: Payer: Self-pay

## 2023-04-01 DIAGNOSIS — Z21 Asymptomatic human immunodeficiency virus [HIV] infection status: Secondary | ICD-10-CM | POA: Insufficient documentation

## 2023-04-01 DIAGNOSIS — G40909 Epilepsy, unspecified, not intractable, without status epilepticus: Secondary | ICD-10-CM | POA: Insufficient documentation

## 2023-04-01 DIAGNOSIS — R569 Unspecified convulsions: Secondary | ICD-10-CM | POA: Diagnosis present

## 2023-04-01 LAB — COMPREHENSIVE METABOLIC PANEL
ALT: 37 U/L (ref 0–44)
AST: 34 U/L (ref 15–41)
Albumin: 3.9 g/dL (ref 3.5–5.0)
Alkaline Phosphatase: 73 U/L (ref 38–126)
Anion gap: 10 (ref 5–15)
BUN: 9 mg/dL (ref 6–20)
CO2: 25 mmol/L (ref 22–32)
Calcium: 9.5 mg/dL (ref 8.9–10.3)
Chloride: 106 mmol/L (ref 98–111)
Creatinine, Ser: 1.1 mg/dL (ref 0.61–1.24)
GFR, Estimated: >60 mL/min (ref >60)
Glucose, Bld: 100 mg/dL — ABNORMAL HIGH (ref 70–99)
Potassium: 4.1 mmol/L (ref 3.5–5.1)
Sodium: 141 mmol/L (ref 135–145)
Total Bilirubin: 1 mg/dL (ref 0.3–1.2)
Total Protein: 6.5 g/dL (ref 6.5–8.1)

## 2023-04-01 LAB — CBC WITH DIFFERENTIAL/PLATELET
Abs Immature Granulocytes: 0.02 10*3/uL (ref 0.00–0.07)
Basophils Absolute: 0.1 10*3/uL (ref 0.0–0.1)
Basophils Relative: 1 %
Eosinophils Absolute: 0.1 10*3/uL (ref 0.0–0.5)
Eosinophils Relative: 2 %
HCT: 47.4 % (ref 39.0–52.0)
Hemoglobin: 15.2 g/dL (ref 13.0–17.0)
Immature Granulocytes: 0 %
Lymphocytes Relative: 46 %
Lymphs Abs: 3.3 10*3/uL (ref 0.7–4.0)
MCH: 31.7 pg (ref 26.0–34.0)
MCHC: 32.1 g/dL (ref 30.0–36.0)
MCV: 98.8 fL (ref 80.0–100.0)
Monocytes Absolute: 0.6 10*3/uL (ref 0.1–1.0)
Monocytes Relative: 9 %
Neutro Abs: 3 10*3/uL (ref 1.7–7.7)
Neutrophils Relative %: 42 %
Platelets: 220 10*3/uL (ref 150–400)
RBC: 4.8 MIL/uL (ref 4.22–5.81)
RDW: 11.9 % (ref 11.5–15.5)
WBC: 7.2 10*3/uL (ref 4.0–10.5)
nRBC: 0 % (ref 0.0–0.2)

## 2023-04-01 NOTE — ED Triage Notes (Signed)
Patient reports seizure episode x1 this evening , denies injury /respirations unlabored . Alert and oriented at arrival/ambulatory . He is taking Keppra for seizures.

## 2023-04-02 MED ORDER — OLANZAPINE 10 MG PO TABS
5.0000 mg | ORAL_TABLET | Freq: Once | ORAL | Status: AC
  Administered 2023-04-02: 5 mg via ORAL
  Filled 2023-04-02: qty 1

## 2023-04-02 MED ORDER — ESCITALOPRAM OXALATE 10 MG PO TABS
10.0000 mg | ORAL_TABLET | Freq: Once | ORAL | Status: AC
  Administered 2023-04-02: 10 mg via ORAL
  Filled 2023-04-02: qty 1

## 2023-04-02 NOTE — Discharge Instructions (Addendum)
Psychiatry has recommended medications and these were sent to pharmacy for you. Medications are at the transitions of care pharmacy, will be FREE OF CHARGE, you just need to pick them up. Can follow-up at the [facility] as well if needed. Return here for new concerns.

## 2023-04-02 NOTE — ED Provider Notes (Signed)
[place] EMERGENCY DEPARTMENT AT [facility] Provider Note   CSN: [id number] Arrival date & time: 04/01/23  1954     History  Chief Complaint  Patient presents with   Seizures    Justin [name] is a 42 y.o. male.  The history is provided by the patient and medical records.  Seizures  42 year old male with history of HIV, hepatitis C, seizure disorder, presenting to the ED with seizures.  He states "I cannot really tell you what happens because I do not remember".  He is not able to tell me whether he had seizures yesterday nor today.  He reports he has been taking his Keppra, now taking at 1000 mg twice daily.  He denies missing any doses.  Patient is well-known to the ED for similar, longstanding history of medication noncompliance.  Patient also reports some hallucinations.  States "I saw my dad grandma who I never met".  He states it is very bizarre.  Also reports some "memory issues" where he does not remember certain days at a time.  Denies recent drug or alcohol use.  Of note, evaluated by psychiatry on 03/30/2023 and cleared for discharge.  Social work met with him and performed medication match for his Lexapro and Zyprexa that was recommended, however he did not pick these up.  He states "I want to be admitted to psych".  Home Medications Prior to Admission medications   Medication Sig Start Date End Date Taking? Authorizing Provider  acetaminophen (TYLENOL) 500 MG tablet Take 1,500 mg by mouth as needed for moderate pain.    [provider]  bictegravir-emtricitabine-tenofovir AF (BIKTARVY) 50-200-25 MG TABS tablet Take 1 tablet by mouth daily. 03/30/23   Rolan Bucco, MD  erythromycin ophthalmic ointment Place into the left eye 4 (four) times daily. Place a 1/2 inch ribbon of ointment into the lower eyelid. 03/20/23   Lonell Grandchild, MD  escitalopram (LEXAPRO) 10 MG tablet Take 1 tablet (10 mg total) by mouth daily. 03/30/23   Lenox Ponds, NP   hydrOXYzine (VISTARIL) 50 MG capsule Take 50 mg by mouth daily as needed for anxiety or itching.    [provider]  levETIRAcetam (KEPPRA) 1000 MG tablet Take 1 tablet (1,000 mg total) by mouth 2 (two) times daily. 03/30/23   Rolan Bucco, MD  OLANZapine (ZYPREXA) 5 MG tablet Take 1 tablet (5 mg total) by mouth 2 (two) times daily. 03/30/23   Lenox Ponds, NP  oxyCODONE (ROXICODONE) 5 MG immediate release tablet Take 1 tablet (5 mg total) by mouth every 6 (six) hours as needed for severe pain. 03/20/23   Lonell Grandchild, MD      Allergies    Patient has no known allergies.    Review of Systems   Review of Systems  Neurological:  Positive for seizures.  All other systems reviewed and are negative.   Physical Exam Updated Vital Signs BP 123/83 (BP Location: Right Arm)   Pulse 71   Temp 97.6 F (36.4 C)   Resp 18   SpO2 100%   Physical Exam Vitals and nursing note reviewed.  Constitutional:      Appearance: He is well-developed.  HENT:     Head: Normocephalic and atraumatic.     Mouth/Throat:     Comments: No tongue/dental injury noted Eyes:     Conjunctiva/sclera: Conjunctivae normal.     Pupils: Pupils are equal, round, and reactive to light.  Cardiovascular:     Rate and  Rhythm: Normal rate and regular rhythm.     Heart sounds: Normal heart sounds.  Pulmonary:     Effort: Pulmonary effort is normal.     Breath sounds: Normal breath sounds.  Abdominal:     General: Bowel sounds are normal.     Palpations: Abdomen is soft.  Musculoskeletal:        General: Normal range of motion.     Cervical back: Normal range of motion.  Skin:    General: Skin is warm and dry.  Neurological:     Mental Status: He is alert and oriented to person, place, and time.     Comments: AAOx3, answering questions and following commands appropriately; equal strength UE and LE bilaterally; CN grossly intact; moves all extremities appropriately without ataxia; no focal neuro  deficits or facial asymmetry appreciated  Psychiatric:     Comments: Reports seeing his "dead grandma", however he is not responding to internal stimuli, he appears very much at baseline     ED Results / Procedures / Treatments   Labs (all labs ordered are listed, but only abnormal results are displayed) Labs Reviewed  COMPREHENSIVE METABOLIC PANEL - Abnormal; Notable for the following components:      Result Value   Glucose, Bld 100 (*)    All other components within normal limits  CBC WITH DIFFERENTIAL/PLATELET    EKG None  Radiology No results found.  Procedures Procedures    Medications Ordered in ED Medications  escitalopram (LEXAPRO) tablet 10 mg (has no administration in time range)  OLANZapine (ZYPREXA) tablet 5 mg (has no administration in time range)    ED Course/ Medical Decision Making/ A&P                                 Medical Decision Making Amount and/or Complexity of Data Reviewed Labs: ordered.  Risk Prescription drug management.   42 year old male presenting to the ED with multiple complaints.  He reports seizures but unable to give much history beyond this.  He reports he has been compliant with his Keppra.  He is well-known to ED for similar.  He is awake, alert, oriented.  He has no visible signs of head trauma.  He has no tongue or dental injury noted.  No recent incontinence.  Labs were obtained and are grossly reassuring.  I do not feel he needs CT of his head at this time, just had CT 03/29/23 which was negative and multiple CT's prior to this.  Will need to continue his keppra, stressed compliance.  Also reports hallucinations.  He reports seeing his dead grandmother.  He is not responding to any internal stimuli on exam.  He does not appear psychotic.  Patient is well-known to me from prior ED visits and he appears very much at his baseline.  I confronted him regarding ER visit and psychiatric evaluation a few days ago for same (03/30/23) and  was cleared for discharge.  He is adamant that "no one talked  to me".  There is extensive note in chart from psychiatric NP.  I have informed him that they recommended medication changes, however did not feel he needed inpatient admission.  It appears social work met with him as well to match his medications so they would be free of charge, however he reports this did not happen either and no medications were prescribed.  I confronted him further, he then tells me he went  to the wrong pharmacy for the meds.  Clinically, he does not appear psychotic and is not acutely SI/HI.  When I made him aware of this he began changing his story multiple times, now very inconsistent.  When confronted about these inconsistencies he gets rather agitated with me, crossed his hands over chest, and refused to talk to me further.  This is patient's usual behaviors here in the ED when he is not getting what he asks for.  He is requesting psychiatric admission, however I do not feel that is indicated at this time.  I have give him doses of his recommended psych meds here and clear instructions to pick them up at the [facility] pharmacy, should be free of charge.  He can follow-up with [facility] as an outpatient if desired.  Return here for new concerns.  Final Clinical Impression(s) / ED Diagnoses Final diagnoses:  Seizure disorder [facility])    Rx / DC Orders ED Discharge Orders     None         Garlon Hatchet, PA-C 04/02/23 [id number]    Tilden Fossa, MD 04/02/23 [phone]

## 2023-04-02 NOTE — ED Notes (Signed)
Pt seen walking out of lobby.  

## 2023-04-03 ENCOUNTER — Other Ambulatory Visit (HOSPITAL_COMMUNITY): Payer: Self-pay

## 2023-04-03 ENCOUNTER — Telehealth: Payer: Self-pay

## 2023-04-03 NOTE — Telephone Encounter (Signed)
Patient came in to ask if Dr. [name] could fill Lexapro and Zyprexa which were prescribed in the hospital. I did tell him I could send Dr. [name] a message requesting these medication but he would probably need to reach out to his Primary care doctor. Patient said he doesn't have a primary card doctor but would try to go to either community health and wellness or urgent care. Patient did not leave a Contact number to reach him.

## 2023-04-04 ENCOUNTER — Other Ambulatory Visit (HOSPITAL_COMMUNITY): Payer: Self-pay

## 2023-04-04 ENCOUNTER — Other Ambulatory Visit: Payer: Self-pay | Admitting: Internal Medicine

## 2023-04-05 NOTE — Telephone Encounter (Signed)
30 day supply was sent 03/30/23 - patient not due for refills.   Sandie Ano, RN

## 2023-04-07 ENCOUNTER — Emergency Department (HOSPITAL_COMMUNITY)
Admission: EM | Admit: 2023-04-07 | Discharge: 2023-04-08 | Disposition: A | Discharge status: Other Acute Inpatient Hospital | Payer: MEDICAID

## 2023-04-07 ENCOUNTER — Other Ambulatory Visit (HOSPITAL_COMMUNITY): Payer: Self-pay

## 2023-04-07 ENCOUNTER — Other Ambulatory Visit: Payer: Self-pay

## 2023-04-07 ENCOUNTER — Encounter (HOSPITAL_COMMUNITY): Payer: Self-pay | Admitting: Emergency Medicine

## 2023-04-07 DIAGNOSIS — Z21 Asymptomatic human immunodeficiency virus [HIV] infection status: Secondary | ICD-10-CM | POA: Diagnosis not present

## 2023-04-07 DIAGNOSIS — F1210 Cannabis abuse, uncomplicated: Secondary | ICD-10-CM | POA: Insufficient documentation

## 2023-04-07 DIAGNOSIS — T391X2A Poisoning by 4-Aminophenol derivatives, intentional self-harm, initial encounter: Secondary | ICD-10-CM | POA: Diagnosis present

## 2023-04-07 DIAGNOSIS — R45850 Homicidal ideations: Secondary | ICD-10-CM

## 2023-04-07 DIAGNOSIS — R569 Unspecified convulsions: Secondary | ICD-10-CM | POA: Insufficient documentation

## 2023-04-07 DIAGNOSIS — T1491XA Suicide attempt, initial encounter: Secondary | ICD-10-CM

## 2023-04-07 DIAGNOSIS — F333 Major depressive disorder, recurrent, severe with psychotic symptoms: Secondary | ICD-10-CM

## 2023-04-07 DIAGNOSIS — F323 Major depressive disorder, single episode, severe with psychotic features: Secondary | ICD-10-CM | POA: Diagnosis not present

## 2023-04-07 DIAGNOSIS — R45851 Suicidal ideations: Secondary | ICD-10-CM | POA: Diagnosis not present

## 2023-04-07 DIAGNOSIS — Z5900 Homelessness unspecified: Secondary | ICD-10-CM | POA: Insufficient documentation

## 2023-04-07 DIAGNOSIS — F17210 Nicotine dependence, cigarettes, uncomplicated: Secondary | ICD-10-CM | POA: Diagnosis not present

## 2023-04-07 DIAGNOSIS — F29 Unspecified psychosis not due to a substance or known physiological condition: Secondary | ICD-10-CM | POA: Diagnosis not present

## 2023-04-07 LAB — RAPID URINE DRUG SCREEN, HOSP PERFORMED
Amphetamines: NOT DETECTED
Barbiturates: NOT DETECTED
Benzodiazepines: NOT DETECTED
Cocaine: NOT DETECTED
Opiates: NOT DETECTED
Tetrahydrocannabinol: POSITIVE — AB

## 2023-04-07 LAB — CBC
HCT: 50.2 % (ref 39.0–52.0)
Hemoglobin: 16.5 g/dL (ref 13.0–17.0)
MCH: 32.9 pg (ref 26.0–34.0)
MCHC: 32.9 g/dL (ref 30.0–36.0)
MCV: 100.2 fL — ABNORMAL HIGH (ref 80.0–100.0)
Platelets: 229 10*3/uL (ref 150–400)
RBC: 5.01 MIL/uL (ref 4.22–5.81)
RDW: 12.3 % (ref 11.5–15.5)
WBC: 8.7 10*3/uL (ref 4.0–10.5)
nRBC: 0 % (ref 0.0–0.2)

## 2023-04-07 LAB — COMPREHENSIVE METABOLIC PANEL
ALT: 33 U/L (ref 0–44)
AST: 25 U/L (ref 15–41)
Albumin: 4.4 g/dL (ref 3.5–5.0)
Alkaline Phosphatase: 85 U/L (ref 38–126)
Anion gap: 12 (ref 5–15)
BUN: 10 mg/dL (ref 6–20)
CO2: 26 mmol/L (ref 22–32)
Calcium: 9.6 mg/dL (ref 8.9–10.3)
Chloride: 104 mmol/L (ref 98–111)
Creatinine, Ser: 0.97 mg/dL (ref 0.61–1.24)
GFR, Estimated: >60 mL/min (ref >60)
Glucose, Bld: 92 mg/dL (ref 70–99)
Potassium: 3.9 mmol/L (ref 3.5–5.1)
Sodium: 142 mmol/L (ref 135–145)
Total Bilirubin: 1.9 mg/dL — ABNORMAL HIGH (ref 0.3–1.2)
Total Protein: 8 g/dL (ref 6.5–8.1)

## 2023-04-07 LAB — ACETAMINOPHEN LEVEL: Acetaminophen (Tylenol), Serum: <10 ug/mL — ABNORMAL LOW (ref 10–30)

## 2023-04-07 LAB — SALICYLATE LEVEL: Salicylate Lvl: <7 mg/dL — ABNORMAL LOW (ref 7.0–30.0)

## 2023-04-07 LAB — ETHANOL: Alcohol, Ethyl (B): <10 mg/dL (ref <10)

## 2023-04-07 LAB — CBG MONITORING, ED: Glucose-Capillary: 126 mg/dL — ABNORMAL HIGH (ref 70–99)

## 2023-04-07 MED ORDER — OLANZAPINE 5 MG PO TABS
5.0000 mg | ORAL_TABLET | Freq: Every day | ORAL | Status: DC
  Administered 2023-04-08: 5 mg via ORAL
  Filled 2023-04-07: qty 1

## 2023-04-07 MED ORDER — LEVETIRACETAM IN NACL 1000 MG/100ML IV SOLN: 1000.0000 mg | Freq: Once | INTRAVENOUS | Status: DC

## 2023-04-07 MED ORDER — ESCITALOPRAM OXALATE 10 MG PO TABS
10.0000 mg | ORAL_TABLET | Freq: Every day | ORAL | Status: DC
  Administered 2023-04-08: 10 mg via ORAL
  Filled 2023-04-07: qty 1

## 2023-04-07 MED ORDER — LEVETIRACETAM 500 MG PO TABS
1000.0000 mg | ORAL_TABLET | Freq: Once | ORAL | Status: AC
  Administered 2023-04-07: 1000 mg via ORAL
  Filled 2023-04-07: qty 2

## 2023-04-07 NOTE — Consult Note (Addendum)
[facility] Consult Note  Patient Name: Justin [name] MRN: [id number] DOB: [birth year] DATE OF Consult: 04/07/2023  PRIMARY PSYCHIATRIC DIAGNOSES  1.  Suicidal and homicidal MDD with psychosis 2.  Unspecified psychosis 3.  Cannabis abuse  RECOMMENDATIONS  Admit to inpatient psych for safety and stabilization.  Medication recommendations: Start Lexapro 10mg  daily for depression Start Zyprexa 5mg  PO QHS for hallucinations  Non-Medication/therapeutic recommendations: Refer to outpatient psychiatric provider for medication management and therapy upon discharge from inpatient psych admission.   Communication: Treatment team members (and family members if applicable) who were involved in treatment/care discussions and planning, and with whom we spoke or engaged with via secure text/chat, include the following:  treatment team Leim Fabry, Davonna Belling [facility], Building surveyor).  Thank you for involving [place] in the care of this patient. If you have any additional questions or concerns, please call [phone] and ask for me or the provider on-call.  TELEPSYCHIATRY ATTESTATION & CONSENT  As the provider for this telehealth consult, I attest that I verified the patient's identity using two separate identifiers, introduced myself to the patient, provided my credentials, disclosed my location, and performed this encounter via a HIPAA-compliant, real-time, face-to-face, two-way, interactive audio and video platform and with the full consent and agreement of the patient (or guardian as applicable.)  Patient physical location: [facility]. Telehealth provider physical location: home office in state of [place].  Video start time: 2[facility] Time) Video end time: 2121 (Central Time)  IDENTIFYING DATA  Justin [name] is a 42 y.o. year-old male for whom a psychiatric consultation has been ordered by the primary provider. The patient was identified using two separate identifiers.  CHIEF  COMPLAINT/REASON FOR CONSULT  Suicidal and homicidal ideations Auditory hallucinations  HISTORY OF PRESENT ILLNESS (HPI)  The patient is a 42 year old male who presents to the ER for evaluation due having auditory hallucinations, depression, and suicidal and homicidal ideations. He reports hearing a voice named "Justin [name]" instructing him to harm himself and others by taking sleeping pills. While he denies any desire to hurt anyone, he expresses fear that he might act on these thoughts without further treatment. The patient's reports a history of suicide attempt approximately 5 years ago through pill ingestion. He reports that he had been admitted to [facility] a few months prior for treatment of schizophrenia, depression, suicidal tendencies, and aggression. Currently, he reports that he is on Lexapro and Zyprexa. However, confusion regarding medication refills has led to uncertainty about whether he has obtained his most recent prescription. His living situation has recently become unstable, with homelessness over the past 2-3 weeks following his mother's fear of his condition, exacerbated by his schizophrenia and seizures. He reports having a seizure just yesterday. Sleep disruption is severe, with the patient stating he does not sleep at all.  Patient reports that his family history is significant for mental health illnesses, including schizophrenia, bipolar disorder, and depression, notably in his mother and brother, both of whom have also attempted suicide. Additionally, there is a history of drug addiction within the family, with the patient himself having used methamphetamine, though he denies recent use. Despite this, his urine tested positive for marijuana, which he denies using. He reports that he has never been in rehab. Per chart review, patient was seen in the ER on 03/30/23 where he reports the need to be admitted to inpatient psych due to mental health issues and homelessness.  Though patient  reports having auditory hallucinations that are distressing to him,  he does not appear to be psychotic. Chart review does not show a history schizophrenia. He does however report that if he is discharged, he would kill himself and also kill someone else.  PAST PSYCHIATRIC HISTORY  Reports a history of inpatient psych. Denies having outpatient psych services. Denies history inpatient substance abuse treatment. Has a history of being noncompliant with medications. He reports a history of suicide attempt by overdose.  PAST MEDICAL HISTORY  Past Medical History:  Diagnosis Date   Chronic hepatitis C without hepatic coma (HCC) 08/17/2017   Endocarditis    History of syphilis 08/25/2014   Treated by [facility] 07-2014.   HIV (human immunodeficiency virus infection) (HCC)    Seizures (HCC)      HOME MEDICATIONS  Facility Ordered Medications  Medication   [COMPLETED] levETIRAcetam (KEPPRA) tablet 1,000 mg   PTA Medications  Medication Sig   acetaminophen (TYLENOL) 500 MG tablet Take 1,500 mg by mouth as needed for moderate pain.   hydrOXYzine (VISTARIL) 50 MG capsule Take 50 mg by mouth daily as needed for anxiety or itching.   oxyCODONE (ROXICODONE) 5 MG immediate release tablet Take 1 tablet (5 mg total) by mouth every 6 (six) hours as needed for severe pain.   OLANZapine (ZYPREXA) 5 MG tablet Take 1 tablet (5 mg total) by mouth 2 (two) times daily.   bictegravir-emtricitabine-tenofovir AF (BIKTARVY) 50-200-25 MG TABS tablet Take 1 tablet by mouth daily.   levETIRAcetam (KEPPRA) 1000 MG tablet Take 1 tablet (1,000 mg total) by mouth 2 (two) times daily.   erythromycin ophthalmic ointment Place into the left eye 4 (four) times daily. Place a 1/2 inch ribbon of ointment into the lower eyelid. (Patient not taking: Reported on 04/07/2023)   escitalopram (LEXAPRO) 10 MG tablet Take 1 tablet (10 mg total) by mouth daily. (Patient not taking: Reported on 04/07/2023)     ALLERGIES  No Known Allergies  SOCIAL  & SUBSTANCE USE HISTORY  Social History   Socioeconomic History   Marital status: Single    Spouse name: Not on file   Number of children: Not on file   Years of education: Not on file   Highest education level: Not on file  Occupational History   Not on file  Tobacco Use   Smoking status: Every Day    Types: Cigarettes   Smokeless tobacco: Never  Vaping Use   Vaping status: Never Used  Substance and Sexual Activity   Alcohol use: Yes    Comment: occ   Drug use: Yes    Types: Marijuana, Methamphetamines   Sexual activity: Not Currently    Partners: Male    Birth control/protection: Condom    Comment: given condoms  Other Topics Concern   Not on file  Social History Narrative   ** Merged History Encounter **       ** Merged History Encounter **       ** Merged History Encounter **       ** Merged History Encounter **       Social Determinants of Health   Financial Resource Strain: Not on file  Food Insecurity: Food Insecurity Present (01/07/2023)   Hunger Vital Sign    Worried About Running Out of Food in the Last Year: Sometimes true    Ran Out of Food in the Last Year: Sometimes true  Transportation Needs: Unmet Transportation Needs (01/07/2023)   PRAPARE - Administrator, Civil Service (Medical): Yes    Lack of Transportation (  Non-Medical): Yes  Physical Activity: Not on file  Stress: Not on file  Social Connections: Not on file   Social History   Tobacco Use  Smoking Status Every Day   Types: Cigarettes  Smokeless Tobacco Never   Social History   Substance and Sexual Activity  Alcohol Use Yes   Comment: occ   Social History   Substance and Sexual Activity  Drug Use Yes   Types: Marijuana, Methamphetamines    Additional pertinent information .  FAMILY HISTORY  Family History  Adopted: Yes  Problem Relation Age of Onset   Diabetes type II Mother    Family Psychiatric History (if known):  Patient reports that his family history is  significant for mental health illnesses, including schizophrenia, bipolar disorder, and depression, notably in his mother and brother, both of whom have also attempted suicide. Additionally, there is a history of drug addiction within the family  MENTAL STATUS EXAM (MSE)  Presentation  General Appearance:  Appropriate for Environment  Eye Contact: Fair  Speech: Clear and Coherent  Speech Volume: Normal  Handedness: Ambidextrous   Mood and Affect  Mood: Depressed; Hopeless; Worthless  Affect: Solicitor Processes: Coherent  Descriptions of Associations: Intact  Orientation: Full (Time, Place and Person)  Thought Content: Logical  History of Schizophrenia/Schizoaffective disorder: No  Duration of Psychotic Symptoms: Less than six months (In context of amphetamine use)  Hallucinations:Hallucinations: Auditory Description of Auditory Hallucinations: reports hearing voices telling him to kill himself and others.  Ideas of Reference: None  Suicidal Thoughts:Suicidal Thoughts: Yes, Active SI Active Intent and/or Plan: With Intent; With Plan  Homicidal Thoughts:Homicidal Thoughts: No   Sensorium  Memory: Immediate Good; Recent Good; Remote Good  Judgment: Fair  Insight: Fair   Art therapist  Concentration: Good  Attention Span: Good  Recall: Fair  Fund of Knowledge: Good  Language: Good   Psychomotor Activity  Psychomotor Activity:Psychomotor Activity: Normal  Assets  Assets: Communication Skills   Sleep  Sleep:Sleep: Poor   VITALS  Blood pressure 116/82, pulse 69, temperature 97.7 F (36.5 C), temperature source Oral, resp. rate 18, height 6' (1.829 m), weight 104.3 kg, SpO2 100%.  LABS  Admission on 04/07/2023  Component Date Value Ref Range Status   Glucose-Capillary 04/07/2023 126 (H)  70 - 99 mg/dL Final   Glucose reference range applies only to samples taken after fasting for at least  8 hours.   Sodium 04/07/2023 142  135 - 145 mmol/L Final   Potassium 04/07/2023 3.9  3.5 - 5.1 mmol/L Final   Chloride 04/07/2023 104  98 - 111 mmol/L Final   CO2 04/07/2023 26  22 - 32 mmol/L Final   Glucose, Bld 04/07/2023 92  70 - 99 mg/dL Final   Glucose reference range applies only to samples taken after fasting for at least 8 hours.   BUN 04/07/2023 10  6 - 20 mg/dL Final   Creatinine, Ser 04/07/2023 0.97  0.61 - 1.24 mg/dL Final   Calcium 40/98/1191 9.6  8.9 - 10.3 mg/dL Final   Total Protein 47/82/9562 8.0  6.5 - 8.1 g/dL Final   Albumin 13/02/6577 4.4  3.5 - 5.0 g/dL Final   AST 46/96/2952 25  15 - 41 U/L Final   ALT 04/07/2023 33  0 - 44 U/L Final   Alkaline Phosphatase 04/07/2023 85  38 - 126 U/L Final   Total Bilirubin 04/07/2023 1.9 (H)  0.3 - 1.2 mg/dL Final   GFR, Estimated 04/07/2023 >  60  >60 mL/min Final   Comment: (NOTE) Calculated using the CKD-EPI Creatinine Equation (2021)    Anion gap 04/07/2023 12  5 - 15 Final   Performed at [facility], [address], [place], [place] [address]   Alcohol, Ethyl (B) 04/07/2023 <10  <10 mg/dL Final   Comment: (NOTE) Lowest detectable limit for serum alcohol is 10 mg/dL.  For medical purposes only. Performed at [facility], [address], [place], [place] [address]    Salicylate Lvl 04/07/2023 <7.0 (L)  7.0 - 30.0 mg/dL Final   Performed at [facility], [address], [place], [place] [address]   Acetaminophen (Tylenol), Serum 04/07/2023 <10 (L)  10 - 30 ug/mL Final   Comment: (NOTE) Therapeutic concentrations vary significantly. A range of 10-30 ug/mL  may be an effective concentration for many patients. However, some  are best treated at concentrations outside of this range. Acetaminophen concentrations >150 ug/mL at 4 hours after ingestion  and >50 ug/mL at 12 hours after ingestion are often associated with  toxic reactions.  Performed at [facility], [address] [place], [place] [address]    WBC 04/07/2023 8.7  4.0 - 10.5 K/uL Final   RBC 04/07/2023 5.01  4.22 - 5.81 MIL/uL Final   Hemoglobin 04/07/2023 16.5  13.0 - 17.0 g/dL Final   HCT 29/56/2130 50.2  39.0 - 52.0 % Final   MCV 04/07/2023 100.2 (H)  80.0 - 100.0 fL Final   MCH 04/07/2023 32.9  26.0 - 34.0 pg Final   MCHC 04/07/2023 32.9  30.0 - 36.0 g/dL Final   RDW 86/57/8469 12.3  11.5 - 15.5 % Final   Platelets 04/07/2023 229  150 - 400 K/uL Final   nRBC 04/07/2023 0.0  0.0 - 0.2 % Final   Performed at [facility], [address], [place], [place] [address]   Opiates 04/07/2023 NONE DETECTED  NONE DETECTED Final   Cocaine 04/07/2023 NONE DETECTED  NONE DETECTED Final   Benzodiazepines 04/07/2023 NONE DETECTED  NONE DETECTED Final   Amphetamines 04/07/2023 NONE DETECTED  NONE DETECTED Final   Tetrahydrocannabinol 04/07/2023 POSITIVE (A)  NONE DETECTED Final   Barbiturates 04/07/2023 NONE DETECTED  NONE DETECTED Final   Comment: (NOTE) DRUG SCREEN FOR MEDICAL PURPOSES ONLY.  IF CONFIRMATION IS NEEDED FOR ANY PURPOSE, NOTIFY LAB WITHIN 5 DAYS.  LOWEST DETECTABLE LIMITS FOR URINE DRUG SCREEN Drug Class                     Cutoff (ng/mL) Amphetamine and metabolites    1000 Barbiturate and metabolites    200 Benzodiazepine                 200 Opiates and metabolites        300 Cocaine and metabolites        300 THC                            50 Performed at [facility], [address], [place], [place] [address]     PSYCHIATRIC REVIEW OF SYSTEMS (ROS)  - Patient reports auditory hallucinations, reporting hearing voices instructing self-harm and harm to others. - Expressed suicidal ideation with a specific plan involving overdose, with reports of having already purchased pills for this purpose. - Displayed poor insight and judgment regarding the severity of the current mental state. - Affect  was congruent  with mood, which was anxious and fearful.  Additional findings:      Musculoskeletal: Impaired      Gait & Station: Laying/Sitting      Pain Screening: Denies      Nutrition & Dental Concerns: Decrease in food intake and/or loss of appetite  RISK FORMULATION/ASSESSMENT  Is the patient experiencing any suicidal or homicidal ideations: Yes       Explain if yes: suicidal and homicidal  Protective factors considered for safety management: access to appropriate clinical services  Risk factors/concerns considered for safety management:  Prior attempt Depression Substance abuse/dependence Male gender Unmarried  Is there a safety management plan with the patient and treatment team to minimize risk factors and promote protective factors: Yes           Explain: admit to psych Is crisis care placement or psychiatric hospitalization recommended: Yes     Based on my current evaluation and risk assessment, patient is determined at this time to be at:  Moderate Risk  *RISK ASSESSMENT Risk assessment is a dynamic process; it is possible that this patient's condition, and risk level, may change. This should be re-evaluated and managed over time as appropriate. Please re-consult psychiatric consult services if additional assistance is needed in terms of risk assessment and management. If your team decides to discharge this patient, please advise the patient how to best access emergency psychiatric services, or to call 911, if their condition worsens or they feel unsafe in any way.   Norval Morton, NP Telepsychiatry Consult Services

## 2023-04-07 NOTE — ED Notes (Signed)
TTS completed. 

## 2023-04-07 NOTE — ED Notes (Signed)
Patient states he is hearing people talk to him and is hearing voices. And he is also talking back to them.

## 2023-04-07 NOTE — ED Provider Notes (Addendum)
[place] EMERGENCY DEPARTMENT AT [facility] Provider Note   CSN: [id number] Arrival date & time: 04/07/23  1437     History  Chief Complaint  Patient presents with   Suicide Attempt   Seizures   HPI Justin [name] is a 42 y.o. male with history of seizures, HIV, chronic hepatitis C, MDD presenting for suicide attempt and concern for seizures. States that this morning he took 4 Tylenol PMs in an effort to end his life today. Patient is homeless and states does not have a lot of resources or support.  Also states that he may have had a surgery last night.  Stating he woke up in the middle of the road. States he takes his Keppra as prescribed every day. Denies any alcohol use.  Also denies fever.  Patient primarily here for "psychiatric help".  Reports that he has plenty of Keppra at home. Patient did mention at that time that he is hearing voices that are telling him to hurt himself and others.  Also mentioned that he has taken Zyprexa in the past but cannot get a doctor to refill his prescription.     Seizures      Home Medications Prior to Admission medications   Medication Sig Start Date End Date Taking? Authorizing Provider  acetaminophen (TYLENOL) 500 MG tablet Take 1,500 mg by mouth as needed for moderate pain.    [provider]  bictegravir-emtricitabine-tenofovir AF (BIKTARVY) 50-200-25 MG TABS tablet Take 1 tablet by mouth daily. 03/30/23   Rolan Bucco, MD  erythromycin ophthalmic ointment Place into the left eye 4 (four) times daily. Place a 1/2 inch ribbon of ointment into the lower eyelid. 03/20/23   Lonell Grandchild, MD  escitalopram (LEXAPRO) 10 MG tablet Take 1 tablet (10 mg total) by mouth daily. 03/30/23   Lenox Ponds, NP  hydrOXYzine (VISTARIL) 50 MG capsule Take 50 mg by mouth daily as needed for anxiety or itching.    [provider]  levETIRAcetam (KEPPRA) 1000 MG tablet Take 1 tablet (1,000 mg total) by mouth 2 (two)  times daily. 03/30/23   Rolan Bucco, MD  OLANZapine (ZYPREXA) 5 MG tablet Take 1 tablet (5 mg total) by mouth 2 (two) times daily. 03/30/23   Lenox Ponds, NP  oxyCODONE (ROXICODONE) 5 MG immediate release tablet Take 1 tablet (5 mg total) by mouth every 6 (six) hours as needed for severe pain. 03/20/23   Lonell Grandchild, MD      Allergies    Patient has no known allergies.    Review of Systems   Review of Systems  Neurological:  Positive for seizures.    Physical Exam Updated Vital Signs BP 116/82 (BP Location: Left Arm)   Pulse 69   Temp 97.7 F (36.5 C) (Oral)   Resp 18   Ht 6' (1.829 m)   Wt 104.3 kg   SpO2 100%   BMI 31.19 kg/m  Physical Exam Vitals and nursing note reviewed.  HENT:     Head: Normocephalic and atraumatic.     Comments: No tongue biting    Mouth/Throat:     Mouth: Mucous membranes are moist.  Eyes:     General:        Right eye: No discharge.        Left eye: No discharge.     Conjunctiva/sclera: Conjunctivae normal.  Cardiovascular:     Rate and Rhythm: Normal rate and regular rhythm.     Pulses: Normal  pulses.     Heart sounds: Normal heart sounds.  Pulmonary:     Effort: Pulmonary effort is normal.     Breath sounds: Normal breath sounds.  Abdominal:     General: Abdomen is flat.     Palpations: Abdomen is soft.  Skin:    General: Skin is warm and dry.  Neurological:     General: No focal deficit present.     Mental Status: He is oriented to person, place, and time.  Psychiatric:        Mood and Affect: Mood normal.     ED Results / Procedures / Treatments   Labs (all labs ordered are listed, but only abnormal results are displayed) Labs Reviewed  COMPREHENSIVE METABOLIC PANEL - Abnormal; Notable for the following components:      Result Value   Total Bilirubin 1.9 (*)    All other components within normal limits  SALICYLATE LEVEL - Abnormal; Notable for the following components:   Salicylate Lvl <7.0 (*)    All other  components within normal limits  ACETAMINOPHEN LEVEL - Abnormal; Notable for the following components:   Acetaminophen (Tylenol), Serum <10 (*)    All other components within normal limits  CBC - Abnormal; Notable for the following components:   MCV 100.2 (*)    All other components within normal limits  RAPID URINE DRUG SCREEN, HOSP PERFORMED - Abnormal; Notable for the following components:   Tetrahydrocannabinol POSITIVE (*)    All other components within normal limits  CBG MONITORING, ED - Abnormal; Notable for the following components:   Glucose-Capillary 126 (*)    All other components within normal limits  ETHANOL    EKG None  Radiology No results found.  Procedures Procedures    Medications Ordered in ED Medications  levETIRAcetam (KEPPRA) tablet 1,000 mg (1,000 mg Oral Given 04/07/23 1912)    ED Course/ Medical Decision Making/ A&P                                 Medical Decision Making Amount and/or Complexity of Data Reviewed Labs: ordered.  Risk Prescription drug management.   41 year old well-appearing male presenting for suicidal ideation and attempts and concern for seizures.  Exam was unremarkable.  DDx includes self-harm, seizure versus seizure-like activity, electrolyte derangement, acute psychosis, sepsis, other.  Overall workup reassuring.  No evidence of seizure on exam and no witnessed seizures or seizure-like activity during encounter today.  Treated with his p.o. dose of Keppra.  Workup also not suggestive of infection.  On serial reassessments, patient continues to be appropriate with staff and behavior is grossly normal.  Patient did mention at that time that he is hearing voices that are telling him to hurt himself and others.  At this point I felt it was appropriate to IVC him.  Also mentioned that he has taken Zyprexa in the past but cannot get a doctor to refill his prescription.  Placed in psych hold for TTS evaluation.        Final  Clinical Impression(s) / ED Diagnoses Final diagnoses:  Suicide attempt [facility])  Suicidal ideation  Seizure-like activity [facility])    Rx / DC Orders ED Discharge Orders     None         Gareth Eagle, PA-C 04/07/23 1944    Gareth Eagle, PA-C 04/07/23 2002    Durwin Glaze, MD 04/07/23 [phone]

## 2023-04-07 NOTE — ED Triage Notes (Signed)
Patient presents due to SI. Patient took a few Tylenol PM to try to "end it all." He is unsure of how many he took. He is frustrated because he is overwhelmed and isn't getting a lot of support. Part of what is troubling him is recurrent seizures. His last seizure was 3 hours ago. When he came to post seizure, he found himself in the middle of the road with memory loss.

## 2023-04-07 NOTE — BH Assessment (Signed)
Contacted Faith with [facility] to request tele-psychiatry consult. Created secure conversation with ED providers to coordinate consult. [facility] coordinator will message with name of psychiatric provider and estimated time of consult.   Pamalee Leyden, [facility], [facility] Triage Specialist

## 2023-04-07 NOTE — ED Notes (Signed)
Pt just informed me he is hearing voices and that they are tell him to kill himself and others.  He also states that the nurse killed him a monster.

## 2023-04-08 ENCOUNTER — Other Ambulatory Visit (HOSPITAL_COMMUNITY): Payer: Self-pay

## 2023-04-08 MED ORDER — LEVETIRACETAM 500 MG PO TABS
1000.0000 mg | ORAL_TABLET | Freq: Two times a day (BID) | ORAL | Status: DC
  Administered 2023-04-08: 1000 mg via ORAL
  Filled 2023-04-08: qty 2

## 2023-04-08 MED ORDER — BIKTARVY 50-200-25 MG PO TABS
1.0000 | ORAL_TABLET | Freq: Every day | ORAL | 0 refills | Status: AC
  Filled 2023-04-08: qty 14, 14d supply, fill #0

## 2023-04-08 NOTE — Progress Notes (Signed)
Pt's admission to [place] has been delayed due to awaiting on pt's family/ support system to bring pt's 2 weeks of HIV.   At 9:36am CSW was informed that family support has assisted and medication has been brought to the ED. CSW spoke with [facility], Leeroy Bock to advise that transport will be coordinated.   Care team notified:Jada Alysia Penna, Melody Griffin,RN, [place], PMHNP   [name], MSW, [facility] 04/08/2023 9:44 AM

## 2023-04-08 NOTE — Progress Notes (Addendum)
LCSW Progress Note  [id number]   Justin [name]  04/08/2023  1:07 AM    Inpatient Behavioral Health Placement  Pt meets inpatient criteria per Norval Morton, NP Telepsychiatry Consult Services.There are no available beds within [facility] system per [facility] Sharyne Peach, RN  Referral was sent to the following facilities;   Destination  Service Provider Address Phone Fax  [facility]  [address], [place] [address] [phone]  [facility] [address]  [address] [place] [address] [phone]  [facility]  [address] [place], [place] [address] [phone]  [facility]  [address] [place] [address] [phone]  [facility]  [address] [place], [place] [address] [phone]  [facility]  [address] [place], [place] [address] [phone]  [facility]  [address] [place] [address] [phone]  [facility]  [address] [place] [address] [phone]  [facility] [address]  [place] [address] [phone]  [facility]  [address], [place] [address] [phone]  [facility]  [address], [place] [address] [phone]  [facility]  [address], [place] [address] [phone]  [facility]  [address] [place] [address] [phone]  [facility]  [address] [place] [address] [phone]  [facility] EFAX  [address] [name] [phone]  [facility]  [address], [place] [address]  [phone]  [facility]  [address] [place] [address] [phone]  [facility]  [address] [place] [address] [phone]  [facility]  [address], [place] [address] [phone]  [facility]  [address], [place] [address] [phone]  [facility]  [address] [place], [place] [address] [phone]  [facility]  [address] [place] [address] [phone]  [facility]  [place] [phone]  [facility]  [address] [place] [address] [phone]  [facility]  [address] [place] [address] [phone]  [facility]  [address] [place] [address] [phone]  [facility]  [place] [phone]   .   Situation ongoing,  CSW will follow up.    [name], MSW, LCSWA 04/08/2023 1:07 AM

## 2023-04-08 NOTE — ED Notes (Signed)
Sheriff at facility to transport patient. Patient white belonging bag, black bookbag, and biohazard bag with keppra and biktarvy sent with sheriff/patient. 3 copies of IVC and EMTALA paperwork sent also.

## 2023-04-08 NOTE — ED Notes (Signed)
RECEIVED CALL FROM CHELSEY IN ADMISSIONS AT [facility] REQUESTING PSYCH CONSULT NOTE BE FAXED TO [phone]. ACTION COMPLETED AT THIS TIME BY THIS NURSE.

## 2023-04-08 NOTE — Progress Notes (Addendum)
Pt was accepted to [facility] TODAY10/11/2022; Bed Assignment [address] PENDING 2 weeks of HIV medication to take to [facility].  Pt meets inpatient criteria per Norval Morton, NP Telepsychiatry Consult Services.  Attending Physician will be Dr. Lauro Regulus T. Lysle Morales, MD  Report can be called to: - [phone]  Pt can arrive after: 8:00am  Care Team notified:Bobby Synetta Shadow, PMHNP    Kelton Pillar, LCSWA 04/08/2023 @ 1:36 AM

## 2023-04-08 NOTE — ED Notes (Signed)
Second call from outside facility 11 58 am 04/08/2023 again requesting edp hpi and initial psych consult note be faxed to [phone]. This nurse notified it was faxed at previous request also. Caller states they did not receive. This nurse gave requested documents to ed secretary and requested secretary refax at this time. Secretary agrees .

## 2023-04-08 NOTE — ED Notes (Signed)
2 bottles keppra and 1 bottle biktarvy received at this time from [facility]. Placed in biohazard bag and sealed with patient label. Will transport with patient. This nurse called sheriff transport and notified patient is ready for transport

## 2023-04-08 NOTE — ED Notes (Signed)
  Pt was accepted to [facility] TODAY10/11/2022; Bed Assignment [address] PENDING 2 weeks of HIV medication to take to [facility].   Pt meets inpatient criteria per Norval Morton, NP Telepsychiatry Consult Services.   Attending Physician will be Dr. Lauro Regulus T. Lysle Morales, MD   Report can be called to: - [phone]   Pt can arrive after: 8:00am  Justin [name] is currently sleeping with no signs of distress or difficulty respirations appear easy and skin color appropriate for his ethnicity.

## 2023-04-08 NOTE — Progress Notes (Signed)
CSW contacted by RN stating pt is needing two week supply of HIV meds prior to transport to [facility]. Per RN, pt spoke with Bruce (listed in emergency contacts) who will bring the medications. Transport was set for 9:10AM; however, Careers adviser to delay transport until meds are obtained.

## 2023-04-08 NOTE — ED Provider Notes (Signed)
Perform transfer to psych facility to Dr. Malissa Hippo.  Hemodynamically stable throughout my care.  Medications provided.   [name], DO 04/08/23 1007

## 2023-04-08 NOTE — ED Notes (Signed)
Patient contacted bruce [phone] to bring medication so patient can be transferred . This nurse called sheriff transport to notify of delay. Had to leave recorded message.

## 2023-04-10 ENCOUNTER — Other Ambulatory Visit (HOSPITAL_COMMUNITY): Payer: Self-pay

## 2023-04-10 ENCOUNTER — Other Ambulatory Visit: Payer: Self-pay

## 2023-04-18 ENCOUNTER — Other Ambulatory Visit (HOSPITAL_COMMUNITY): Payer: Self-pay

## 2023-04-21 ENCOUNTER — Other Ambulatory Visit (HOSPITAL_COMMUNITY): Payer: Self-pay

## 2023-04-25 ENCOUNTER — Other Ambulatory Visit: Payer: Self-pay

## 2023-04-25 ENCOUNTER — Encounter: Payer: Self-pay | Admitting: Internal Medicine

## 2023-04-25 ENCOUNTER — Ambulatory Visit (INDEPENDENT_AMBULATORY_CARE_PROVIDER_SITE_OTHER): Payer: MEDICAID | Admitting: Internal Medicine

## 2023-04-25 VITALS — BP 132/86 | HR 87 | Temp 98.1°F | Resp 16 | Wt 236.4 lb

## 2023-04-25 DIAGNOSIS — Z113 Encounter for screening for infections with a predominantly sexual mode of transmission: Secondary | ICD-10-CM

## 2023-04-25 DIAGNOSIS — B20 Human immunodeficiency virus [HIV] disease: Secondary | ICD-10-CM

## 2023-04-25 DIAGNOSIS — F1520 Other stimulant dependence, uncomplicated: Secondary | ICD-10-CM

## 2023-04-25 DIAGNOSIS — Z23 Encounter for immunization: Secondary | ICD-10-CM

## 2023-04-25 DIAGNOSIS — Z59811 Housing instability, housed, with risk of homelessness: Secondary | ICD-10-CM

## 2023-04-25 DIAGNOSIS — G40909 Epilepsy, unspecified, not intractable, without status epilepticus: Secondary | ICD-10-CM

## 2023-04-25 NOTE — Assessment & Plan Note (Signed)
Encouraged him to get in a substance abuse program.   I provided a letter of medical clearance in regards to infectious diseases.

## 2023-04-25 NOTE — Assessment & Plan Note (Signed)
Will screen 

## 2023-04-25 NOTE — Progress Notes (Signed)
   Subjective:    Patient ID: Justin [name], male    DOB: July 19, 1980, 42 y.o.   MRN: [id number]  HPI Justin [name] is here for a work in visit.  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   He is currently homeless and working with [facility] for housing.     Review of Systems  Constitutional:  Negative for fatigue.       Objective:   Physical Exam Pulmonary:     Effort: Pulmonary effort is normal.  Neurological:     Mental Status: He is alert.           Assessment & Plan:

## 2023-04-25 NOTE — Assessment & Plan Note (Signed)
Advised to continue to work with [facility] for housing.

## 2023-04-25 NOTE — Assessment & Plan Note (Addendum)
He is doing well on his medication and will confirm with labs today.  Previous labs reviewed with him.  Follow up in 3 months  I have personally spent 45 minutes involved in face-to-face and non-face-to-face activities for this patient on the day of the visit. Professional time spent includes the following activities: Preparing to see the patient (review of tests), Obtaining and/or reviewing separately obtained history (admission/discharge record), Performing a medically appropriate examination and/or evaluation , Ordering medications/tests/procedures, referring and communicating with other health care professionals, Documenting clinical information in the EMR, Independently interpreting results (not separately reported), Communicating results to the patient/family/caregiver, Counseling and educating the patient/family/caregiver and Care coordination (not separately reported).

## 2023-04-26 LAB — T-HELPER CELL (CD4) - (RCID CLINIC ONLY)
CD4 % Helper T Cell: 31 % — ABNORMAL LOW (ref 33–65)
CD4 T Cell Abs: 537 /uL (ref 400–1790)

## 2023-04-27 LAB — HIV-1 RNA QUANT-NO REFLEX-BLD
HIV 1 RNA Quant: NOT DETECTED {copies}/mL
HIV-1 RNA Quant, Log: NOT DETECTED {Log_copies}/mL

## 2023-04-27 LAB — RPR: RPR Ser Ql: NONREACTIVE

## 2023-04-28 ENCOUNTER — Other Ambulatory Visit (HOSPITAL_COMMUNITY): Payer: Self-pay

## 2023-04-29 ENCOUNTER — Other Ambulatory Visit (HOSPITAL_COMMUNITY): Payer: Self-pay

## 2023-04-30 ENCOUNTER — Emergency Department (HOSPITAL_COMMUNITY)
Admission: EM | Admit: 2023-04-30 | Discharge: 2023-05-02 | Disposition: A | Discharge status: Home / Self Care | Payer: MEDICAID | Attending: Student | Admitting: Student

## 2023-04-30 ENCOUNTER — Encounter (HOSPITAL_COMMUNITY): Payer: Self-pay

## 2023-04-30 DIAGNOSIS — Z5900 Homelessness unspecified: Secondary | ICD-10-CM | POA: Insufficient documentation

## 2023-04-30 DIAGNOSIS — Z72811 Adult antisocial behavior: Secondary | ICD-10-CM | POA: Diagnosis present

## 2023-04-30 DIAGNOSIS — M79604 Pain in right leg: Secondary | ICD-10-CM | POA: Diagnosis not present

## 2023-04-30 DIAGNOSIS — R569 Unspecified convulsions: Secondary | ICD-10-CM

## 2023-04-30 DIAGNOSIS — R45851 Suicidal ideations: Secondary | ICD-10-CM | POA: Diagnosis not present

## 2023-04-30 DIAGNOSIS — Z79899 Other long term (current) drug therapy: Secondary | ICD-10-CM | POA: Insufficient documentation

## 2023-04-30 DIAGNOSIS — F1594 Other stimulant use, unspecified with stimulant-induced mood disorder: Secondary | ICD-10-CM | POA: Diagnosis present

## 2023-04-30 DIAGNOSIS — Z21 Asymptomatic human immunodeficiency virus [HIV] infection status: Secondary | ICD-10-CM | POA: Diagnosis not present

## 2023-04-30 DIAGNOSIS — M79605 Pain in left leg: Secondary | ICD-10-CM | POA: Insufficient documentation

## 2023-04-30 DIAGNOSIS — F17210 Nicotine dependence, cigarettes, uncomplicated: Secondary | ICD-10-CM | POA: Diagnosis not present

## 2023-04-30 DIAGNOSIS — G40909 Epilepsy, unspecified, not intractable, without status epilepticus: Secondary | ICD-10-CM | POA: Diagnosis not present

## 2023-04-30 DIAGNOSIS — F1520 Other stimulant dependence, uncomplicated: Secondary | ICD-10-CM | POA: Diagnosis present

## 2023-04-30 DIAGNOSIS — F29 Unspecified psychosis not due to a substance or known physiological condition: Secondary | ICD-10-CM | POA: Diagnosis not present

## 2023-04-30 DIAGNOSIS — F323 Major depressive disorder, single episode, severe with psychotic features: Secondary | ICD-10-CM | POA: Diagnosis present

## 2023-04-30 DIAGNOSIS — R1030 Lower abdominal pain, unspecified: Secondary | ICD-10-CM | POA: Insufficient documentation

## 2023-04-30 DIAGNOSIS — F333 Major depressive disorder, recurrent, severe with psychotic symptoms: Secondary | ICD-10-CM | POA: Diagnosis not present

## 2023-04-30 DIAGNOSIS — R456 Violent behavior: Secondary | ICD-10-CM | POA: Diagnosis present

## 2023-04-30 DIAGNOSIS — Z765 Malingerer [conscious simulation]: Secondary | ICD-10-CM | POA: Diagnosis not present

## 2023-04-30 DIAGNOSIS — F22 Delusional disorders: Secondary | ICD-10-CM | POA: Insufficient documentation

## 2023-04-30 LAB — COMPREHENSIVE METABOLIC PANEL
ALT: 37 U/L (ref 0–44)
AST: 53 U/L — ABNORMAL HIGH (ref 15–41)
Albumin: 4.6 g/dL (ref 3.5–5.0)
Alkaline Phosphatase: 81 U/L (ref 38–126)
Anion gap: 16 — ABNORMAL HIGH (ref 5–15)
BUN: 26 mg/dL — ABNORMAL HIGH (ref 6–20)
CO2: 20 mmol/L — ABNORMAL LOW (ref 22–32)
Calcium: 9.3 mg/dL (ref 8.9–10.3)
Chloride: 101 mmol/L (ref 98–111)
Creatinine, Ser: 1.21 mg/dL (ref 0.61–1.24)
GFR, Estimated: >60 mL/min (ref >60)
Glucose, Bld: 88 mg/dL (ref 70–99)
Potassium: 3.5 mmol/L (ref 3.5–5.1)
Sodium: 137 mmol/L (ref 135–145)
Total Bilirubin: 2.8 mg/dL — ABNORMAL HIGH (ref 0.3–1.2)
Total Protein: 7.8 g/dL (ref 6.5–8.1)

## 2023-04-30 LAB — RAPID URINE DRUG SCREEN, HOSP PERFORMED
Amphetamines: POSITIVE — AB
Barbiturates: NOT DETECTED
Benzodiazepines: NOT DETECTED
Cocaine: NOT DETECTED
Opiates: NOT DETECTED
Tetrahydrocannabinol: NOT DETECTED

## 2023-04-30 LAB — URINALYSIS, ROUTINE W REFLEX MICROSCOPIC
Bilirubin Urine: NEGATIVE
Glucose, UA: NEGATIVE mg/dL
Hgb urine dipstick: NEGATIVE
Ketones, ur: 20 mg/dL — AB
Leukocytes,Ua: NEGATIVE
Nitrite: NEGATIVE
Protein, ur: NEGATIVE mg/dL
Specific Gravity, Urine: 1.027 (ref 1.005–1.030)
pH: 5 (ref 5.0–8.0)

## 2023-04-30 LAB — CBC WITH DIFFERENTIAL/PLATELET
Abs Immature Granulocytes: 0.03 10*3/uL (ref 0.00–0.07)
Basophils Absolute: 0 10*3/uL (ref 0.0–0.1)
Basophils Relative: 0 %
Eosinophils Absolute: 0 10*3/uL (ref 0.0–0.5)
Eosinophils Relative: 0 %
HCT: 47.6 % (ref 39.0–52.0)
Hemoglobin: 15.9 g/dL (ref 13.0–17.0)
Immature Granulocytes: 0 %
Lymphocytes Relative: 25 %
Lymphs Abs: 2.5 10*3/uL (ref 0.7–4.0)
MCH: 32.3 pg (ref 26.0–34.0)
MCHC: 33.4 g/dL (ref 30.0–36.0)
MCV: 96.7 fL (ref 80.0–100.0)
Monocytes Absolute: 1.1 10*3/uL — ABNORMAL HIGH (ref 0.1–1.0)
Monocytes Relative: 11 %
Neutro Abs: 6.5 10*3/uL (ref 1.7–7.7)
Neutrophils Relative %: 64 %
Platelets: 218 10*3/uL (ref 150–400)
RBC: 4.92 MIL/uL (ref 4.22–5.81)
RDW: 12.1 % (ref 11.5–15.5)
WBC: 10.2 10*3/uL (ref 4.0–10.5)
nRBC: 0 % (ref 0.0–0.2)

## 2023-04-30 MED ORDER — OLANZAPINE 5 MG PO TABS
5.0000 mg | ORAL_TABLET | Freq: Two times a day (BID) | ORAL | Status: DC
  Administered 2023-04-30 – 2023-05-01 (×3): 5 mg via ORAL
  Filled 2023-04-30 (×3): qty 1

## 2023-04-30 MED ORDER — SODIUM CHLORIDE 0.9 % IV SOLN
2000.0000 mg | Freq: Once | INTRAVENOUS | Status: DC
Start: 2023-04-30 — End: 2023-04-30

## 2023-04-30 MED ORDER — BICTEGRAVIR-EMTRICITAB-TENOFOV 50-200-25 MG PO TABS
1.0000 | ORAL_TABLET | Freq: Every day | ORAL | Status: DC
  Administered 2023-04-30 – 2023-05-02 (×3): 1 via ORAL
  Filled 2023-04-30 (×3): qty 1

## 2023-04-30 MED ORDER — LORAZEPAM 2 MG/ML IJ SOLN: 0.5000 mg | Freq: Once | INTRAMUSCULAR | Status: DC

## 2023-04-30 MED ORDER — LEVETIRACETAM IN NACL 1000 MG/100ML IV SOLN
1000.0000 mg | INTRAVENOUS | Status: AC
  Administered 2023-04-30 (×2): 1000 mg via INTRAVENOUS
  Filled 2023-04-30 (×2): qty 100

## 2023-04-30 MED ORDER — LEVETIRACETAM 500 MG PO TABS
1000.0000 mg | ORAL_TABLET | Freq: Two times a day (BID) | ORAL | Status: DC
  Administered 2023-04-30 – 2023-05-02 (×4): 1000 mg via ORAL
  Filled 2023-04-30 (×4): qty 2

## 2023-04-30 MED ORDER — LORAZEPAM 2 MG/ML IJ SOLN
1.0000 mg | Freq: Once | INTRAMUSCULAR | Status: AC
  Administered 2023-04-30: 1 mg via INTRAVENOUS
  Filled 2023-04-30: qty 1

## 2023-04-30 NOTE — ED Notes (Signed)
Pt has one bag of belongings placed in cabinet for pts in 1-4

## 2023-04-30 NOTE — Consult Note (Signed)
Attempt to evaluate patient failed as he is somnolence after he was medicated this morning.  Will try assessment later when he can participate.

## 2023-04-30 NOTE — ED Notes (Signed)
Pt has urinal at bed side for urine specimen. Pt provided breakfast tray at this time.

## 2023-04-30 NOTE — Consult Note (Signed)
BH ED ASSESSMENT   Reason for Consult:  Psychiatry evaluation  Referring Physician:  ER Physician Patient Identification: Justin [name] MRN:  [id number] ED Chief Complaint: MDD (major depressive disorder), recurrent, severe, with psychosis (HCC)  Diagnosis:  Principal Problem:   MDD (major depressive disorder), recurrent, severe, with psychosis (HCC)   ED Assessment Time Calculation: Start Time: 1435 Stop Time: 1457 Total Time in Minutes (Assessment Completion): 22   Subjective:   Justin [name] is a 42 y.o. male patient admitted with psychiatry hx significant for Schizophrenia, Amphetamine use disorder, Amphetamine induced mood disorder, Cannabis use disorder, MDD and suicide ideations .originally came to the ER for seizure disorder and bilateral foot pain.  HPI:  Patient was seen earlier but was unable to engage in meaningful conversation.  Patient was seen this afternoon sleeping but easily aroused.  Patient states he came in foot pain and seizures but at the same time informed somebody that he was hearing voices. Patient remains disorganized, confused and only oriented to his name.  He states this year is 39 43, the month is March and that he is nobody.  Patient  on arrival by Nursing report was agitated with dilated pupils.  He requested to see the Psych provider due to hearing voices. Patient reports hearing voices but added he cannot make out what the voices are saying.  He sees things that are not there.  Patient reports he was hospitalized couple of months ago.  He does not have outpatient Psychiatrist and his PCP prescribes his Olanzapine he says.  UDS result is pending at this time, ALT slightly elevated, Total Bilirubin elevated.  Patient suddenly became irritable accusing provider of asking too much questions.  Patient covered himself and went back to sleep. Chart review shows multiple inpatient Psychiatry hospitalizations at [facility] and [facility] as well.   Patient denies SI/HI.  His  mood remains labile, he has not been on his Olanzapine for three days he said because he is currently out of Olanzapine.  We will seek inpatient Psychiatry hospitalization.  We will fax out records to facilities with available bed.  He was hospitalized a couple of Months ago he says.  Past Psychiatric History: Hx significant for Schizophrenia, Amphetamine use disorder, Amphetamine induced mood disorder, Cannabis use disorder, MDD and suicide ideations multiple inpatient Psychiatry hospitalizations at [facility] and [facility] as well.  He does not have outpatient Psychiatrist. Risk to Self or Others: Is the patient at risk to self? No Has the patient been a risk to self in the past 6 months? No Has the patient been a risk to self within the distant past? No Is the patient a risk to others? No Has the patient been a risk to others in the past 6 months? No Has the patient been a risk to others within the distant past? No  [place] Scale:  Flowsheet Row ED from 04/30/2023 in [facility] Emergency Department at [facility] ED from 04/07/2023 in [facility] Emergency Department at [facility] ED from 04/01/2023 in [facility] Emergency Department at [facility]  C-SSRS RISK CATEGORY Error: Question 6 not populated High Risk No Risk       AIMS:  , , ,  ,   ASAM:    Substance Abuse:     Past Medical History:  Past Medical History:  Diagnosis Date   Chronic hepatitis C without hepatic coma (HCC) 08/17/2017   Endocarditis    History of syphilis 08/25/2014   Treated  by [facility] 07-2014.   HIV (human immunodeficiency virus infection) (HCC)    Seizures (HCC)     Past Surgical History:  Procedure Laterality Date   hand surgery Right    LUMBAR PUNCTURE  08/08/2019       WRIST SURGERY     Family History:  Family History  Adopted: Yes  Problem Relation Age of Onset   Diabetes type II Mother    Family Psychiatric  History: denies Social History:  Social History   Substance and  Sexual Activity  Alcohol Use Yes   Comment: occ     Social History   Substance and Sexual Activity  Drug Use Yes   Types: Marijuana, Methamphetamines    Social History   Socioeconomic History   Marital status: Single    Spouse name: Not on file   Number of children: Not on file   Years of education: Not on file   Highest education level: Not on file  Occupational History   Not on file  Tobacco Use   Smoking status: Every Day    Types: Cigarettes   Smokeless tobacco: Never  Vaping Use   Vaping status: Never Used  Substance and Sexual Activity   Alcohol use: Yes    Comment: occ   Drug use: Yes    Types: Marijuana, Methamphetamines   Sexual activity: Not Currently    Partners: Male    Birth control/protection: Condom    Comment: given condoms  Other Topics Concern   Not on file  Social History Narrative   ** Merged History Encounter **       ** Merged History Encounter **       ** Merged History Encounter **       ** Merged History Encounter **       Social Determinants of Health   Financial Resource Strain: Not on file  Food Insecurity: Food Insecurity Present (01/07/2023)   Hunger Vital Sign    Worried About Running Out of Food in the Last Year: Sometimes true    Ran Out of Food in the Last Year: Sometimes true  Transportation Needs: Unmet Transportation Needs (01/07/2023)   PRAPARE - Administrator, Civil Service (Medical): Yes    Lack of Transportation (Non-Medical): Yes  Physical Activity: Not on file  Stress: Not on file  Social Connections: Not on file   Additional Social History:    Allergies:  No Known Allergies  Labs:  Results for orders placed or performed during the hospital encounter of 04/30/23 (from the past 48 hour(s))  Comprehensive metabolic panel     Status: Abnormal   Collection Time: 04/30/23  5:00 AM  Result Value Ref Range   Sodium 137 135 - 145 mmol/L   Potassium 3.5 3.5 - 5.1 mmol/L   Chloride 101 98 - 111 mmol/L    CO2 20 (L) 22 - 32 mmol/L   Glucose, Bld 88 70 - 99 mg/dL    Comment: Glucose reference range applies only to samples taken after fasting for at least 8 hours.   BUN 26 (H) 6 - 20 mg/dL   Creatinine, Ser 1.61 0.61 - 1.24 mg/dL   Calcium 9.3 8.9 - 09.6 mg/dL   Total Protein 7.8 6.5 - 8.1 g/dL   Albumin 4.6 3.5 - 5.0 g/dL   AST 53 (H) 15 - 41 U/L   ALT 37 0 - 44 U/L   Alkaline Phosphatase 81 38 - 126 U/L   Total Bilirubin 2.8 (H)  0.3 - 1.2 mg/dL   GFR, Estimated >16 >10 mL/min    Comment: (NOTE) Calculated using the CKD-EPI Creatinine Equation (2021)    Anion gap 16 (H) 5 - 15    Comment: Performed at [facility], [address], [place], [place] [address]  CBC with Differential     Status: Abnormal   Collection Time: 04/30/23  5:00 AM  Result Value Ref Range   WBC 10.2 4.0 - 10.5 K/uL   RBC 4.92 4.22 - 5.81 MIL/uL   Hemoglobin 15.9 13.0 - 17.0 g/dL   HCT 40.9 81.1 - 91.4 %   MCV 96.7 80.0 - 100.0 fL   MCH 32.3 26.0 - 34.0 pg   MCHC 33.4 30.0 - 36.0 g/dL   RDW 78.2 95.6 - 21.3 %   Platelets 218 150 - 400 K/uL   nRBC 0.0 0.0 - 0.2 %   Neutrophils Relative % 64 %   Neutro Abs 6.5 1.7 - 7.7 K/uL   Lymphocytes Relative 25 %   Lymphs Abs 2.5 0.7 - 4.0 K/uL   Monocytes Relative 11 %   Monocytes Absolute 1.1 (H) 0.1 - 1.0 K/uL   Eosinophils Relative 0 %   Eosinophils Absolute 0.0 0.0 - 0.5 K/uL   Basophils Relative 0 %   Basophils Absolute 0.0 0.0 - 0.1 K/uL   Immature Granulocytes 0 %   Abs Immature Granulocytes 0.03 0.00 - 0.07 K/uL    Comment: Performed at [facility], [address], [place], [place] [address]    Current Facility-Administered Medications  Medication Dose Route Frequency Provider Last Rate Last Admin   bictegravir-emtricitabine-tenofovir AF (BIKTARVY) 50-200-25 MG per tablet 1 tablet  1 tablet Oral Daily Kommor, Madison, MD   1 tablet at 04/30/23 1044   OLANZapine (ZYPREXA) tablet 5 mg  5 mg Oral BID Kommor,  Madison, MD   5 mg at 04/30/23 8469   Current Outpatient Medications  Medication Sig Dispense Refill   acetaminophen (TYLENOL) 500 MG tablet Take 1,500 mg by mouth as needed for moderate pain. (Patient not taking: Reported on 04/25/2023)     bictegravir-emtricitabine-tenofovir AF (BIKTARVY) 50-200-25 MG TABS tablet Take 1 tablet by mouth daily. 30 tablet 0   erythromycin ophthalmic ointment Place into the left eye 4 (four) times daily. Place a 1/2 inch ribbon of ointment into the lower eyelid. (Patient not taking: Reported on 04/07/2023) 3.5 g 0   escitalopram (LEXAPRO) 10 MG tablet Take 1 tablet (10 mg total) by mouth daily. (Patient not taking: Reported on 04/07/2023) 30 tablet 0   hydrOXYzine (VISTARIL) 50 MG capsule Take 50 mg by mouth daily as needed for anxiety or itching. (Patient not taking: Reported on 04/25/2023)     levETIRAcetam (KEPPRA) 1000 MG tablet Take 1 tablet (1,000 mg total) by mouth 2 (two) times daily. 60 tablet 0   OLANZapine (ZYPREXA) 5 MG tablet Take 1 tablet (5 mg total) by mouth 2 (two) times daily. 60 tablet 0   oxyCODONE (ROXICODONE) 5 MG immediate release tablet Take 1 tablet (5 mg total) by mouth every 6 (six) hours as needed for severe pain. (Patient not taking: Reported on 04/25/2023) 5 tablet 0    Musculoskeletal: Strength & Muscle Tone:  in bed sleeping Gait & Station:  in bed sleeping Patient leans:  see above   Psychiatric Specialty Exam: Presentation  General Appearance:  Casual  Eye Contact: None  Speech: Clear and Coherent  Speech Volume: Normal  Handedness: Right   Mood  and Affect  Mood: Irritable; Labile  Affect: Congruent; Labile   Thought Process  Thought Processes: Disorganized  Descriptions of Associations:Tangential  Orientation:Partial  Thought Content:Illogical; Tangential; Scattered  History of Schizophrenia/Schizoaffective disorder:No  Duration of Psychotic Symptoms:Less than six months (In context of  amphetamine use)  Hallucinations:Hallucinations: Auditory; Visual Description of Auditory Hallucinations: Hears voices talking at the samne time and is unable to make out what they are saying. Description of Visual Hallucinations: Sees Different things that are not normal.  Ideas of Reference:None  Suicidal Thoughts:Suicidal Thoughts: No  Homicidal Thoughts:Homicidal Thoughts: No   Sensorium  Memory: Immediate Fair; Recent Poor; Remote Poor  Judgment: Impaired  Insight: Shallow   Executive Functions  Concentration: Fair  Attention Span: Fair  Recall: Poor  Fund of Knowledge: Poor  Language: Fair   Psychomotor Activity  Psychomotor Activity: Psychomotor Activity: Normal   Assets  Assets: Communication Skills; Desire for Improvement    Sleep  Sleep: Sleep: Good   Physical Exam: Physical Exam Constitutional:      Appearance: Normal appearance.  HENT:     Nose: Nose normal.  Cardiovascular:     Rate and Rhythm: Normal rate and regular rhythm.  Pulmonary:     Effort: Pulmonary effort is normal.  Musculoskeletal:        General: Normal range of motion.  Skin:    General: Skin is dry.  Neurological:     Mental Status: He is alert and oriented to person, place, and time.  Psychiatric:        Attention and Perception: He is inattentive. He perceives auditory and visual hallucinations.        Speech: Speech is tangential.        Behavior: Behavior is agitated and combative.        Judgment: Judgment is impulsive.    Review of Systems  Constitutional: Negative.   HENT: Negative.    Eyes: Negative.   Respiratory: Negative.    Cardiovascular: Negative.   Gastrointestinal: Negative.   Genitourinary: Negative.   Musculoskeletal: Negative.   Skin: Negative.   Neurological: Negative.   Endo/Heme/Allergies: Negative.   Psychiatric/Behavioral:         Irritable, agitation, combative   Blood pressure 137/69, pulse 85, temperature 97.6 F  (36.4 C), resp. rate 11, SpO2 100%. There is no height or weight on file to calculate BMI.  Medical Decision Making: We will resume his Olanzapine, seek bed [facility] inpatient Psychiatry hospitalization.  Records will be faxed out for available beds.  Disposition:  admit, seek bed placement.  Earney Navy, NP-PMHNP-BC 04/30/2023 3:26 PM

## 2023-04-30 NOTE — ED Notes (Signed)
Went in and informed patient we needed a urine sample. Patient states he is working on it. JRPRN

## 2023-04-30 NOTE — ED Notes (Addendum)
Julieanne Cotton, Psych NP, at bedside to complete TTS. Pt cooperative at not combative at this time.   Per Julieanne Cotton, pt was very confused and was unable to complete the initial TTS.

## 2023-04-30 NOTE — ED Provider Notes (Signed)
[place] EMERGENCY DEPARTMENT AT [facility] Provider Note  CSN: [id number] Arrival date & time: 04/30/23 7829  Chief Complaint(s) Seizures  HPI Justin [name] is a 42 y.o. male with PMH HIV, chronic hep C, seizure disorder, frequent hospitalizations for seizure-like episodes and psychiatric presentations, polysubstance use, housing instability, antisocial behavior and malingering with aggressive behavior who presents emergency department for evaluation of seizure-like behavior.  Patient walked through the lobby and stated that he had multiple seizures today.  He then had an episode of shaking in front of hospital staff but is back to normal mental status baseline immediately with no postictal period.  States he has not taken any of his seizure medicines today.  Arrives very agitated with dilated pupils and mild diaphoresis.  Displaying evidence of paranoia on arrival.  Demanding to speak to a psychiatrist stating that he is hearing voices.  Denies chest pain, shortness of breath, abdominal pain, nausea, vomiting or other systemic symptoms.   Past Medical History Past Medical History:  Diagnosis Date   Chronic hepatitis C without hepatic coma (HCC) 08/17/2017   Endocarditis    History of syphilis 08/25/2014   Treated by [facility] 07-2014.   HIV (human immunodeficiency virus infection) (HCC)    Seizures (HCC)    Patient Active Problem List   Diagnosis Date Noted   Homicidal ideation 04/07/2023   Cannabis abuse 04/07/2023   Suicidal ideation 04/07/2023   Homelessness 03/30/2023   Cannabis use disorder, severe, dependence (HCC) 03/30/2023   Malingering 03/30/2023   Adult antisocial behavior 03/30/2023   Housing instability due to imminent risk of homelessness 03/03/2023   URI with cough and congestion 03/03/2023   MDD (major depressive disorder), recurrent severe, without psychosis (HCC) 01/18/2023   MDD (major depressive disorder), recurrent, severe, with psychosis (HCC)  01/06/2023   Seizure (HCC) 12/24/2021   Seizure disorder (HCC) 10/28/2021   Seizures (HCC) 07/31/2021   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HCC)    Suicidal ideations 07/08/2020   Amphetamine use disorder, 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   Home Medication(s) Prior to Admission medications   Medication Sig Start Date End Date Taking? Authorizing Provider  acetaminophen (TYLENOL) 500 MG tablet Take 1,500 mg by mouth as needed for moderate pain. Patient not taking: Reported on 04/25/2023    [provider]  bictegravir-emtricitabine-tenofovir AF (BIKTARVY) 50-200-25 MG TABS tablet Take 1 tablet by mouth daily. 03/30/23   Rolan Bucco, MD  erythromycin ophthalmic ointment Place into the left eye 4 (four) times daily. Place a 1/2 inch ribbon of ointment into the lower eyelid. Patient not taking: Reported on 04/07/2023 03/20/23   Lonell Grandchild, MD  escitalopram (LEXAPRO) 10 MG tablet Take 1 tablet (10 mg total) by mouth daily. Patient not taking: Reported on 04/07/2023 03/30/23   Lenox Ponds, NP  hydrOXYzine (VISTARIL) 50 MG capsule Take 50 mg by mouth daily as needed for anxiety or itching. Patient not taking: Reported on 04/25/2023    [provider]  levETIRAcetam (KEPPRA) 1000 MG tablet Take 1 tablet (1,000 mg total) by mouth 2 (two) times daily. 03/30/23   Rolan Bucco, MD  OLANZapine (  ZYPREXA) 5 MG tablet Take 1 tablet (5 mg total) by mouth 2 (two) times daily. 03/30/23   Lenox Ponds, NP  oxyCODONE (ROXICODONE) 5 MG immediate release tablet  Take 1 tablet (5 mg total) by mouth every 6 (six) hours as needed for severe pain. Patient not taking: Reported on 04/25/2023 03/20/23   Lonell Grandchild, MD                                                                                                                                    Past Surgical History Past Surgical History:  Procedure Laterality Date   hand surgery Right    LUMBAR PUNCTURE  08/08/2019       WRIST SURGERY     Family History Family History  Adopted: Yes  Problem Relation Age of Onset   Diabetes type II Mother     Social History Social History   Tobacco Use   Smoking status: Every Day    Types: Cigarettes   Smokeless tobacco: Never  Vaping Use   Vaping status: Never Used  Substance Use Topics   Alcohol use: Yes    Comment: occ   Drug use: Yes    Types: Marijuana, Methamphetamines   Allergies Patient has no known allergies.  Review of Systems Review of Systems  Neurological:  Positive for seizures.  Psychiatric/Behavioral:  Positive for agitation.     Physical Exam Vital Signs  I have reviewed the triage vital signs BP 124/85   Pulse 94   Temp 97.8 F (36.6 C) (Oral)   Resp 12   SpO2 100%   Physical Exam Constitutional:      General: He is not in acute distress.    Appearance: Normal appearance. He is diaphoretic.  HENT:     Head: Normocephalic and atraumatic.     Nose: No congestion or rhinorrhea.  Eyes:     General:        Right eye: No discharge.        Left eye: No discharge.     Extraocular Movements: Extraocular movements intact.     Pupils: Pupils are equal, round, and reactive to light.     Comments: Dilated pupils  Cardiovascular:     Rate and Rhythm: Normal rate and regular rhythm.     Heart sounds: No murmur heard. Pulmonary:     Effort: No respiratory distress.     Breath sounds: No wheezing or rales.  Abdominal:     General: There is no distension.     Tenderness: There is no abdominal tenderness.   Musculoskeletal:        General: Normal range of motion.     Cervical back: Normal range of motion.  Skin:    General: Skin is warm.  Neurological:     General: No focal deficit present.     Mental Status: He is alert.     Comments: Agitated  ED Results and Treatments Labs (all labs ordered are listed, but only abnormal results are displayed) Labs Reviewed  COMPREHENSIVE METABOLIC PANEL - Abnormal; Notable for the following components:      Result Value   CO2 20 (*)    BUN 26 (*)    AST 53 (*)    Total Bilirubin 2.8 (*)    Anion gap 16 (*)    All other components within normal limits  CBC WITH DIFFERENTIAL/PLATELET - Abnormal; Notable for the following components:   Monocytes Absolute 1.1 (*)    All other components within normal limits  RAPID URINE DRUG SCREEN, HOSP PERFORMED  URINALYSIS, ROUTINE W REFLEX MICROSCOPIC                                                                                                                          Radiology No results found.  Pertinent labs & imaging results that were available during my care of the patient were reviewed by me and considered in my medical decision making (see MDM for details).  Medications Ordered in ED Medications  OLANZapine (ZYPREXA) tablet 5 mg (5 mg Oral Given by Other 04/30/23 0527)  bictegravir-emtricitabine-tenofovir AF (BIKTARVY) 50-200-25 MG per tablet 1 tablet (has no administration in time range)  LORazepam (ATIVAN) injection 1 mg (1 mg Intravenous Given 04/30/23 0505)  levETIRAcetam (KEPPRA) IVPB 1000 mg/100 mL premix (1,000 mg Intravenous New Bag/Given 04/30/23 0601)                                                                                                                                     Procedures Procedures  (including critical care time)  Medical Decision Making / ED Course   This patient presents to the ED for concern of seizure-like behavior, hallucinations, agitation, this  involves an extensive number of treatment options, and is a complaint that carries with it a high risk of complications and morbidity.  The differential diagnosis includes seizure, secondary gain, polysubstance use, psychosis, nonepileptic behavior  MDM: Patient seen emergency room for evaluation of multiple complaints including seizure-like behavior, hallucinations and paranoia.  Physical exam reveals an agitated patient with dilated pupils and diaphoresis.  Moving all extremities and neurologic exam otherwise unremarkable.  Laboratory evaluation with a bilirubin of 2.8 but is otherwise unremarkable.  Loaded with Keppra and given Ativan for agitation.  I have very low suspicion for true seizure behavior  today as the patient had no postictal period after the shaking episode witnessed by staff.  Although I do have some concern that patient may be using the emergency department for secondary gain today, he did recently have a psychiatric admission for similar behavior and we will operate with an abundance of caution and consult TTS.  Patient medically clear and TTS consulted for persistent hallucinations.  Please see provider signout for continuation of workup.   Additional history obtained:  -External records from outside source obtained and reviewed including: Chart review including previous notes, labs, imaging, consultation notes   Lab Tests: -I ordered, reviewed, and interpreted labs.   The pertinent results include:   Labs Reviewed  COMPREHENSIVE METABOLIC PANEL - Abnormal; Notable for the following components:      Result Value   CO2 20 (*)    BUN 26 (*)    AST 53 (*)    Total Bilirubin 2.8 (*)    Anion gap 16 (*)    All other components within normal limits  CBC WITH DIFFERENTIAL/PLATELET - Abnormal; Notable for the following components:   Monocytes Absolute 1.1 (*)    All other components within normal limits  RAPID URINE DRUG SCREEN, HOSP PERFORMED  URINALYSIS, ROUTINE W REFLEX  MICROSCOPIC      EKG   EKG Interpretation Date/Time:  Sunday April 30 2023 04:18:40 EDT Ventricular Rate:  105 PR Interval:  129 QRS Duration:  94 QT Interval:  353 QTC Calculation: 467 R Axis:   -40  Text Interpretation: Sinus tachycardia Left anterior fascicular block Confirmed by Halvor Behrend ([id number]) on 04/30/2023 4:48:57 AM         Medicines ordered and prescription drug management: Meds ordered this encounter  Medications   DISCONTD: levETIRAcetam (KEPPRA) 2,000 mg in sodium chloride 0.9 % 250 mL IVPB   DISCONTD: LORazepam (ATIVAN) injection 0.5 mg   LORazepam (ATIVAN) injection 1 mg   levETIRAcetam (KEPPRA) IVPB 1000 mg/100 mL premix   OLANZapine (ZYPREXA) tablet 5 mg   bictegravir-emtricitabine-tenofovir AF (BIKTARVY) 50-200-25 MG per tablet 1 tablet    -I have reviewed the patients home medicines and have made adjustments as needed  Critical interventions none  Consultations Obtained: I requested consultation with the TTS providers,  and discussed lab and imaging findings as well as pertinent plan - they recommend: Recommendations pending   Cardiac Monitoring: The patient was maintained on a cardiac monitor.  I personally viewed and interpreted the cardiac monitored which showed an underlying rhythm of: NSR  Social Determinants of Health:  Factors impacting patients care include: Housing instability   Reevaluation: After the interventions noted above, I reevaluated the patient and found that they have :improved  Co morbidities that complicate the patient evaluation  Past Medical History:  Diagnosis Date   Chronic hepatitis C without hepatic coma (HCC) 08/17/2017   Endocarditis    History of syphilis 08/25/2014   Treated by [facility] 07-2014.   HIV (human immunodeficiency virus infection) (HCC)    Seizures (HCC)       Dispostion: I considered admission for this patient, and disposition pending TTS evaluation.  Please see provider signout for  continuation of workup     Final Clinical Impression(s) / ED Diagnoses Final diagnoses:  None     @PCDICTATION @    Glendora Score, MD 04/30/23 [phone]

## 2023-04-30 NOTE — ED Notes (Signed)
Pt ate full meal tray and went back to sleep

## 2023-04-30 NOTE — ED Notes (Signed)
Attempted to wake pt to change into scrubs. Pt unable to stay awake long enough to safely change at this time.

## 2023-04-30 NOTE — ED Provider Notes (Signed)
Home keppra 1,000 mg PO BID ordered. Home biktarvy already ordered. Psych addressing psychiatric medications.   Loetta Rough, MD 04/30/23 [phone]

## 2023-04-30 NOTE — ED Triage Notes (Signed)
Pt walked up to the lobby and stated he had multiple seizures and has hx of same.  Pt c/o Bilat foot pain.

## 2023-05-01 DIAGNOSIS — F333 Major depressive disorder, recurrent, severe with psychotic symptoms: Secondary | ICD-10-CM | POA: Diagnosis not present

## 2023-05-01 MED ORDER — HYDROCODONE-ACETAMINOPHEN 5-325 MG PO TABS
1.0000 | ORAL_TABLET | Freq: Once | ORAL | Status: AC
  Administered 2023-05-01: 1 via ORAL
  Filled 2023-05-01: qty 1

## 2023-05-01 MED ORDER — ESCITALOPRAM OXALATE 10 MG PO TABS
20.0000 mg | ORAL_TABLET | Freq: Every day | ORAL | Status: DC
  Administered 2023-05-01 – 2023-05-02 (×2): 20 mg via ORAL
  Filled 2023-05-01 (×2): qty 2

## 2023-05-01 MED ORDER — OLANZAPINE 10 MG PO TABS: 10.0000 mg | ORAL_TABLET | Freq: Every day | ORAL | Status: DC

## 2023-05-01 NOTE — ED Provider Notes (Signed)
Emergency Medicine Observation Re-evaluation Note  Justin [name] is a 42 y.o. male, seen on rounds today.  Pt initially presented to the ED for complaints of Seizures Currently, the patient is resting  Physical Exam  BP 107/70 (BP Location: Left Arm)   Pulse 87   Temp 97.9 F (36.6 C) (Oral)   Resp 17   SpO2 98%  Physical Exam General: NAD Cardiac: Regular heart rate Lungs: No respiratory distress   ED Course / MDM  EKG:EKG Interpretation Date/Time:  Sunday April 30 2023 04:18:40 EDT Ventricular Rate:  105 PR Interval:  129 QRS Duration:  94 QT Interval:  353 QTC Calculation: 467 R Axis:   -40  Text Interpretation: Sinus tachycardia Left anterior fascicular block Confirmed by Kommor, Madison ([id number]) on 04/30/2023 4:48:57 AM  I have reviewed the labs performed to date as well as medications administered while in observation.    Plan  Current plan is for inpatient psychiatric bed placement.  Patient prescribed home keppra and biktarvy    Terald Sleeper, MD 05/01/23 [phone]

## 2023-05-01 NOTE — Progress Notes (Signed)
[facility] Psych ED Progress Note  05/01/2023 5:43 PM Justin [name]  MRN:  [id number]   Subjective:  Justin [name] is a 42 y.o. male patient admitted with psychiatry hx significant for Schizophrenia, Amphetamine use disorder, Amphetamine induced mood disorder, Cannabis use disorder, MDD and suicide ideations .originally came to the ER for seizure disorder and bilateral foot pain.. Patient was seen this afternoon sleeping in the room but easily aroused.  He came for foot pain and seizure.  He is back on his Keppra and is taking Olanzapine for Psychosis.  Patient reports hearing voices telling him to kill people and kill himself.  Patient does not see any Psychiatrist in the community.  He reports that his Infectious disease doctor prescribes his Olanzapine.  Patient gets irritable and angry when discharge is mentions.  Patient states he need to get help for [facility] and that he may act on the voices.  Patient will be faxed out for admission while he continues to take his Olanzapine. Principal Problem: MDD (major depressive disorder), recurrent, severe, with psychosis (HCC) Diagnosis:  Principal Problem:   MDD (major depressive disorder), recurrent, severe, with psychosis (HCC)   ED Assessment Time Calculation: Start Time: 1649 Stop Time: 1703 Total Time in Minutes (Assessment Completion): 14   Past Psychiatric History: see initial Psychiatry evaluation note  [place] Scale:  Flowsheet Row ED from 04/30/2023 in [facility] Emergency Department at [facility] ED from 04/07/2023 in [facility] Emergency Department at [facility] ED from 04/01/2023 in [facility] Emergency Department at [facility]  C-SSRS RISK CATEGORY Error: Question 6 not populated High Risk No Risk       Past Medical History:  Past Medical History:  Diagnosis Date   Chronic hepatitis C without hepatic coma (HCC) 08/17/2017   Endocarditis    History of syphilis 08/25/2014   Treated by [facility] 07-2014.    HIV (human immunodeficiency virus infection) (HCC)    Seizures (HCC)     Past Surgical History:  Procedure Laterality Date   hand surgery Right    LUMBAR PUNCTURE  08/08/2019       WRIST SURGERY     Family History:  Family History  Adopted: Yes  Problem Relation Age of Onset   Diabetes type II Mother    Family Psychiatric  History:see initial Psychiatry evaluation note  Social History:  Social History   Substance and Sexual Activity  Alcohol Use Yes   Comment: occ     Social History   Substance and Sexual Activity  Drug Use Yes   Types: Marijuana, Methamphetamines    Social History   Socioeconomic History   Marital status: Single    Spouse name: Not on file   Number of children: Not on file   Years of education: Not on file   Highest education level: Not on file  Occupational History   Not on file  Tobacco Use   Smoking status: Every Day    Types: Cigarettes   Smokeless tobacco: Never  Vaping Use   Vaping status: Never Used  Substance and Sexual Activity   Alcohol use: Yes    Comment: occ   Drug use: Yes    Types: Marijuana, Methamphetamines   Sexual activity: Not Currently    Partners: Male    Birth control/protection: Condom    Comment: given condoms  Other Topics Concern   Not on file  Social History Narrative   ** Merged History Encounter **       **  Merged History Encounter **       ** Merged History Encounter **       ** Merged History Encounter **       Social Determinants of Health   Financial Resource Strain: Not on file  Food Insecurity: Food Insecurity Present (01/07/2023)   Hunger Vital Sign    Worried About Running Out of Food in the Last Year: Sometimes true    Ran Out of Food in the Last Year: Sometimes true  Transportation Needs: Unmet Transportation Needs (01/07/2023)   PRAPARE - Administrator, Civil Service (Medical): Yes    Lack of Transportation (Non-Medical): Yes  Physical Activity: Not on file  Stress: Not on  file  Social Connections: Not on file    Sleep: Good  Appetite:  Good  Current Medications: Current Facility-Administered Medications  Medication Dose Route Frequency Provider Last Rate Last Admin   bictegravir-emtricitabine-tenofovir AF (BIKTARVY) 50-200-25 MG per tablet 1 tablet  1 tablet Oral Daily Kommor, Madison, MD   1 tablet at 05/01/23 0920   escitalopram (LEXAPRO) tablet 20 mg  20 mg Oral Daily Dahlia Byes C, NP   20 mg at 05/01/23 1738   levETIRAcetam (KEPPRA) tablet 1,000 mg  1,000 mg Oral BID Loetta Rough, MD   1,000 mg at 05/01/23 0920   [START ON 05/02/2023] OLANZapine (ZYPREXA) tablet 10 mg  10 mg Oral QHS Dahlia Byes C, NP       Current Outpatient Medications  Medication Sig Dispense Refill   acetaminophen (TYLENOL) 500 MG tablet Take 500-1,000 mg by mouth every 6 (six) hours as needed for mild pain (pain score 1-3), moderate pain (pain score 4-6) or headache.     bictegravir-emtricitabine-tenofovir AF (BIKTARVY) 50-200-25 MG TABS tablet Take 1 tablet by mouth daily. 30 tablet 0   escitalopram (LEXAPRO) 20 MG tablet Take 20 mg by mouth daily.     levETIRAcetam (KEPPRA) 1000 MG tablet Take 1 tablet (1,000 mg total) by mouth 2 (two) times daily. 60 tablet 0   OLANZapine (ZYPREXA) 5 MG tablet Take 1 tablet (5 mg total) by mouth 2 (two) times daily. 60 tablet 0   erythromycin ophthalmic ointment Place into the left eye 4 (four) times daily. Place a 1/2 inch ribbon of ointment into the lower eyelid. (Patient not taking: Reported on 04/30/2023) 3.5 g 0   escitalopram (LEXAPRO) 10 MG tablet Take 1 tablet (10 mg total) by mouth daily. (Patient not taking: Reported on 04/30/2023) 30 tablet 0   oxyCODONE (ROXICODONE) 5 MG immediate release tablet Take 1 tablet (5 mg total) by mouth every 6 (six) hours as needed for severe pain. (Patient not taking: Reported on 04/30/2023) 5 tablet 0    Lab Results:  Results for orders placed or performed during the hospital encounter  of 04/30/23 (from the past 48 hour(s))  Comprehensive metabolic panel     Status: Abnormal   Collection Time: 04/30/23  5:00 AM  Result Value Ref Range   Sodium 137 135 - 145 mmol/L   Potassium 3.5 3.5 - 5.1 mmol/L   Chloride 101 98 - 111 mmol/L   CO2 20 (L) 22 - 32 mmol/L   Glucose, Bld 88 70 - 99 mg/dL    Comment: Glucose reference range applies only to samples taken after fasting for at least 8 hours.   BUN 26 (H) 6 - 20 mg/dL   Creatinine, Ser 4.78 0.61 - 1.24 mg/dL   Calcium 9.3 8.9 - 29.5 mg/dL  Total Protein 7.8 6.5 - 8.1 g/dL   Albumin 4.6 3.5 - 5.0 g/dL   AST 53 (H) 15 - 41 U/L   ALT 37 0 - 44 U/L   Alkaline Phosphatase 81 38 - 126 U/L   Total Bilirubin 2.8 (H) 0.3 - 1.2 mg/dL   GFR, Estimated >16 >10 mL/min    Comment: (NOTE) Calculated using the CKD-EPI Creatinine Equation (2021)    Anion gap 16 (H) 5 - 15    Comment: Performed at [facility], [address] [place], [place] [address]  CBC with Differential     Status: Abnormal   Collection Time: 04/30/23  5:00 AM  Result Value Ref Range   WBC 10.2 4.0 - 10.5 K/uL   RBC 4.92 4.22 - 5.81 MIL/uL   Hemoglobin 15.9 13.0 - 17.0 g/dL   HCT 40.9 81.1 - 91.4 %   MCV 96.7 80.0 - 100.0 fL   MCH 32.3 26.0 - 34.0 pg   MCHC 33.4 30.0 - 36.0 g/dL   RDW 78.2 95.6 - 21.3 %   Platelets 218 150 - 400 K/uL   nRBC 0.0 0.0 - 0.2 %   Neutrophils Relative % 64 %   Neutro Abs 6.5 1.7 - 7.7 K/uL   Lymphocytes Relative 25 %   Lymphs Abs 2.5 0.7 - 4.0 K/uL   Monocytes Relative 11 %   Monocytes Absolute 1.1 (H) 0.1 - 1.0 K/uL   Eosinophils Relative 0 %   Eosinophils Absolute 0.0 0.0 - 0.5 K/uL   Basophils Relative 0 %   Basophils Absolute 0.0 0.0 - 0.1 K/uL   Immature Granulocytes 0 %   Abs Immature Granulocytes 0.03 0.00 - 0.07 K/uL    Comment: Performed at [facility], [address] [place], [place] [address]  Rapid urine drug screen (hospital performed)     Status: Abnormal    Collection Time: 04/30/23  5:00 PM  Result Value Ref Range   Opiates NONE DETECTED NONE DETECTED   Cocaine NONE DETECTED NONE DETECTED   Benzodiazepines NONE DETECTED NONE DETECTED   Amphetamines POSITIVE (A) NONE DETECTED   Tetrahydrocannabinol NONE DETECTED NONE DETECTED   Barbiturates NONE DETECTED NONE DETECTED    Comment: (NOTE) DRUG SCREEN FOR MEDICAL PURPOSES ONLY.  IF CONFIRMATION IS NEEDED FOR ANY PURPOSE, NOTIFY LAB WITHIN 5 DAYS.  LOWEST DETECTABLE LIMITS FOR URINE DRUG SCREEN Drug Class                     Cutoff (ng/mL) Amphetamine and metabolites    1000 Barbiturate and metabolites    200 Benzodiazepine                 200 Opiates and metabolites        300 Cocaine and metabolites        300 THC                            50 Performed at [facility], [address], [place], [place] [address]   Urinalysis, Routine w reflex microscopic -Urine, Clean Catch     Status: Abnormal   Collection Time: 04/30/23  5:00 PM  Result Value Ref Range   Color, Urine YELLOW YELLOW   APPearance CLEAR CLEAR   Specific Gravity, Urine 1.027 1.005 - 1.030   pH 5.0 5.0 - 8.0   Glucose, UA NEGATIVE NEGATIVE mg/dL   Hgb urine dipstick NEGATIVE  NEGATIVE   Bilirubin Urine NEGATIVE NEGATIVE   Ketones, ur 20 (A) NEGATIVE mg/dL   Protein, ur NEGATIVE NEGATIVE mg/dL   Nitrite NEGATIVE NEGATIVE   Leukocytes,Ua NEGATIVE NEGATIVE   RBC / HPF 0-5 0 - 5 RBC/hpf   WBC, UA 0-5 0 - 5 WBC/hpf   Bacteria, UA RARE (A) NONE SEEN   Squamous Epithelial / HPF 0-5 0 - 5 /HPF   Mucus PRESENT     Comment: Performed at [facility], [address], [place], [place] [address]    Blood Alcohol level:  Lab Results  Component Value Date   [facility] <10 04/07/2023   ETH <10 03/29/2023    Physical Findings:  CIWA:    COWS:     Musculoskeletal: Strength & Muscle Tone: within normal limits Gait & Station: normal Patient leans: Front  Psychiatric Specialty  Exam:  Presentation  General Appearance:  Casual  Eye Contact: None  Speech: Clear and Coherent  Speech Volume: Increased  Handedness: Right   Mood and Affect  Mood: Depressed  Affect: Congruent   Thought Process  Thought Processes: Coherent  Descriptions of Associations:Intact  Orientation:Full (Time, Place and Person)  Thought Content:Logical  History of Schizophrenia/Schizoaffective disorder:No  Duration of Psychotic Symptoms:Less than six months (In context of amphetamine use)  Hallucinations:Hallucinations: Auditory Description of Auditory Hallucinations: voices telling him to hurt himself and others, Description of Visual Hallucinations: Sees Different things that are not normal.  Ideas of Reference:None  Suicidal Thoughts:Suicidal Thoughts: No  Homicidal Thoughts:Homicidal Thoughts: No   Sensorium  Memory: Immediate Good; Recent Good; Remote Good  Judgment: Fair  Insight: Good   Executive Functions  Concentration: Good  Attention Span: Good  Recall: Good  Fund of Knowledge: Good  Language: Good   Psychomotor Activity  Psychomotor Activity: Psychomotor Activity: Normal   Assets  Assets: Communication Skills   Sleep  Sleep: Sleep: Good    Physical Exam: Physical Exam Vitals and nursing note reviewed.  Constitutional:      Appearance: Normal appearance.  HENT:     Nose: Nose normal.  Cardiovascular:     Rate and Rhythm: Normal rate and regular rhythm.  Pulmonary:     Effort: Pulmonary effort is normal.  Musculoskeletal:        General: Normal range of motion.     Cervical back: Normal range of motion.  Skin:    General: Skin is dry.  Neurological:     Mental Status: He is alert and oriented to person, place, and time.  Psychiatric:        Attention and Perception: Attention normal. He perceives auditory hallucinations.        Mood and Affect: Mood is anxious.        Speech: Speech normal.         Behavior: Behavior normal. Behavior is cooperative.        Judgment: Judgment is inappropriate.    Review of Systems  Constitutional: Negative.   HENT: Negative.    Eyes: Negative.   Respiratory: Negative.    Cardiovascular: Negative.   Gastrointestinal: Negative.   Genitourinary: Negative.   Musculoskeletal: Negative.   Skin: Negative.   Neurological: Negative.   Endo/Heme/Allergies: Negative.   Psychiatric/Behavioral:  Positive for hallucinations. The patient is nervous/anxious.    Blood pressure 100/68, pulse 84, temperature 98 F (36.7 C), temperature source Oral, resp. rate 16, SpO2 98%. There is no height or weight on file to calculate BMI.   Medical Decision Making: Patient endorses  AH stating voices are telling him to kill himself and others.  Patient states that he is not sure he will not do what the voices are saying to him.  His escitalopram 20 mg po daily resumed this evening.  Patient is being faxed out for bed placement.  Earney Navy, NP-PMHNP-BC 05/01/2023, 5:43 PM

## 2023-05-01 NOTE — Progress Notes (Signed)
LCSW Progress Note  [id number]   Justin [name]  05/01/2023  7:20 PM    Inpatient Behavioral Health Placement  Pt meets inpatient criteria per Earney Navy, NP-PMHNP-BC. There are no available beds within [facility] system per [facility] Rona Ravens, RN. Referral was sent to the following facilities;   Destination  Service Provider Address Phone [facility] [place]  [address] [place], [place] [address] [phone]  [facility]  [address] [place] [address] [phone]  [facility] [address]  [address] [place] [address] [phone]  [facility]  [address] [place], [place] [address] [phone]  [facility]  [address] [place] [address] [phone]  [facility]  [address] [place] [address] [phone]  [facility]  [address] [place], [place] [address] [phone]  [facility]  [address] [place], [place] [address] [phone]  [facility]  [address] [place] [address] [phone]  [facility]  [address] [place] [address] [phone]  [facility]  [address], [place] [address] [phone]  [facility]  [address] [place], [place] [phone]  [facility]  [address] [place] [address] [phone]  [facility]  [address], [place] [address] [phone]  [facility]  [address] [place], [place] [address] [phone]  [facility]  [address],  [place] [address] [phone]  [facility]  [address] [place], [place] [address] [phone]  [facility]  [address] [place] [address] [phone]  [facility] [address]  [place] [address] [phone]  [facility]  [address] [place] [address] [phone]  [facility]  [address] [place] [address] [phone]  [facility] EFAX  [place] [phone]    Situation ongoing,  CSW will follow up.    [name], MSW, LCSWA 05/01/2023 7:20 PM

## 2023-05-02 ENCOUNTER — Other Ambulatory Visit: Payer: Self-pay

## 2023-05-02 ENCOUNTER — Emergency Department (EMERGENCY_DEPARTMENT_HOSPITAL)
Admission: EM | Admit: 2023-05-02 | Discharge: 2023-05-03 | Disposition: A | Discharge status: Home / Self Care | Payer: MEDICAID | Source: Home / Self Care | Attending: Emergency Medicine | Admitting: Emergency Medicine

## 2023-05-02 ENCOUNTER — Encounter (HOSPITAL_COMMUNITY): Payer: Self-pay

## 2023-05-02 DIAGNOSIS — M79604 Pain in right leg: Secondary | ICD-10-CM | POA: Insufficient documentation

## 2023-05-02 DIAGNOSIS — R1030 Lower abdominal pain, unspecified: Secondary | ICD-10-CM | POA: Insufficient documentation

## 2023-05-02 DIAGNOSIS — Z21 Asymptomatic human immunodeficiency virus [HIV] infection status: Secondary | ICD-10-CM | POA: Insufficient documentation

## 2023-05-02 DIAGNOSIS — R569 Unspecified convulsions: Secondary | ICD-10-CM | POA: Insufficient documentation

## 2023-05-02 DIAGNOSIS — R109 Unspecified abdominal pain: Secondary | ICD-10-CM | POA: Insufficient documentation

## 2023-05-02 DIAGNOSIS — R443 Hallucinations, unspecified: Secondary | ICD-10-CM | POA: Insufficient documentation

## 2023-05-02 DIAGNOSIS — F29 Unspecified psychosis not due to a substance or known physiological condition: Secondary | ICD-10-CM | POA: Insufficient documentation

## 2023-05-02 DIAGNOSIS — R45851 Suicidal ideations: Secondary | ICD-10-CM | POA: Insufficient documentation

## 2023-05-02 DIAGNOSIS — G40909 Epilepsy, unspecified, not intractable, without status epilepticus: Secondary | ICD-10-CM

## 2023-05-02 DIAGNOSIS — M79605 Pain in left leg: Secondary | ICD-10-CM | POA: Insufficient documentation

## 2023-05-02 DIAGNOSIS — F329 Major depressive disorder, single episode, unspecified: Secondary | ICD-10-CM | POA: Insufficient documentation

## 2023-05-02 DIAGNOSIS — Z5900 Homelessness unspecified: Secondary | ICD-10-CM | POA: Insufficient documentation

## 2023-05-02 DIAGNOSIS — Z765 Malingerer [conscious simulation]: Secondary | ICD-10-CM

## 2023-05-02 DIAGNOSIS — F333 Major depressive disorder, recurrent, severe with psychotic symptoms: Secondary | ICD-10-CM

## 2023-05-02 LAB — CBC WITH DIFFERENTIAL/PLATELET
Abs Immature Granulocytes: 0.03 10*3/uL (ref 0.00–0.07)
Basophils Absolute: 0 10*3/uL (ref 0.0–0.1)
Basophils Relative: 0 %
Eosinophils Absolute: 0 10*3/uL (ref 0.0–0.5)
Eosinophils Relative: 0 %
HCT: 46.8 % (ref 39.0–52.0)
Hemoglobin: 15.7 g/dL (ref 13.0–17.0)
Immature Granulocytes: 0 %
Lymphocytes Relative: 23 %
Lymphs Abs: 1.8 10*3/uL (ref 0.7–4.0)
MCH: 32.6 pg (ref 26.0–34.0)
MCHC: 33.5 g/dL (ref 30.0–36.0)
MCV: 97.1 fL (ref 80.0–100.0)
Monocytes Absolute: 0.5 10*3/uL (ref 0.1–1.0)
Monocytes Relative: 6 %
Neutro Abs: 5.7 10*3/uL (ref 1.7–7.7)
Neutrophils Relative %: 71 %
Platelets: 206 10*3/uL (ref 150–400)
RBC: 4.82 MIL/uL (ref 4.22–5.81)
RDW: 11.9 % (ref 11.5–15.5)
WBC: 8.1 10*3/uL (ref 4.0–10.5)
nRBC: 0 % (ref 0.0–0.2)

## 2023-05-02 LAB — LIPASE, BLOOD: Lipase: 33 U/L (ref 11–51)

## 2023-05-02 LAB — COMPREHENSIVE METABOLIC PANEL
ALT: 27 U/L (ref 0–44)
AST: 25 U/L (ref 15–41)
Albumin: 4.2 g/dL (ref 3.5–5.0)
Alkaline Phosphatase: 68 U/L (ref 38–126)
Anion gap: 11 (ref 5–15)
BUN: 14 mg/dL (ref 6–20)
CO2: 25 mmol/L (ref 22–32)
Calcium: 9.1 mg/dL (ref 8.9–10.3)
Chloride: 103 mmol/L (ref 98–111)
Creatinine, Ser: 0.95 mg/dL (ref 0.61–1.24)
GFR, Estimated: >60 mL/min (ref >60)
Glucose, Bld: 112 mg/dL — ABNORMAL HIGH (ref 70–99)
Potassium: 3.6 mmol/L (ref 3.5–5.1)
Sodium: 139 mmol/L (ref 135–145)
Total Bilirubin: 1.9 mg/dL — ABNORMAL HIGH (ref 0.3–1.2)
Total Protein: 7.4 g/dL (ref 6.5–8.1)

## 2023-05-02 MED ORDER — OLANZAPINE 10 MG PO TABS
10.0000 mg | ORAL_TABLET | Freq: Every day | ORAL | Status: DC
  Administered 2023-05-02: 10 mg via ORAL
  Filled 2023-05-02: qty 1

## 2023-05-02 MED ORDER — BIKTARVY 50-200-25 MG PO TABS: 1.0000 | ORAL_TABLET | Freq: Every day | ORAL | 0 refills | Status: DC

## 2023-05-02 MED ORDER — OLANZAPINE 5 MG PO TBDP
5.0000 mg | ORAL_TABLET | Freq: Once | ORAL | Status: AC
  Administered 2023-05-02: 5 mg via ORAL
  Filled 2023-05-02: qty 1

## 2023-05-02 MED ORDER — ESCITALOPRAM OXALATE 20 MG PO TABS: 20.0000 mg | ORAL_TABLET | Freq: Every day | ORAL | 0 refills | Status: DC

## 2023-05-02 MED ORDER — LEVETIRACETAM 500 MG PO TABS
1000.0000 mg | ORAL_TABLET | Freq: Two times a day (BID) | ORAL | Status: DC
  Administered 2023-05-02 – 2023-05-03 (×2): 1000 mg via ORAL
  Filled 2023-05-02 (×2): qty 2

## 2023-05-02 MED ORDER — OLANZAPINE 10 MG PO TABS: 10.0000 mg | ORAL_TABLET | Freq: Every day | ORAL | 0 refills | Status: DC

## 2023-05-02 MED ORDER — ACETAMINOPHEN 325 MG PO TABS: 650.0000 mg | ORAL_TABLET | ORAL | Status: DC | PRN

## 2023-05-02 NOTE — ED Notes (Signed)
Per facility staff, Pt never left campus after being discharged this afternoon.

## 2023-05-02 NOTE — ED Triage Notes (Signed)
Pt states that he feels like he is going to have a seizure, hearing voices in his head telling him to hurt himself/others. Pt reports losing all his medications. Friend of pt reports that he believes the problem is the seizures are causing pt to lose his meds, and start this cycle for the pt. States that if the pt could be placed in a more secure house where he wont lose his pills he can stop having to come in.

## 2023-05-02 NOTE — ED Provider Notes (Signed)
Emergency Medicine Observation Re-evaluation Note  Justin [name] is a 42 y.o. male, seen on rounds today.  Pt initially presented to the ED for complaints of Seizures Currently, the patient is resting comfortably.  No complaints from the overnight nursing staff.  Physical Exam  BP 110/68 (BP Location: Left Arm)   Pulse 63   Temp 98.7 F (37.1 C) (Oral)   Resp 18   SpO2 98%  Physical Exam General: No acute distress Cardiac: Regular rate Lungs: No respiratory distress Psych: Currently calm  ED Course / MDM  EKG:EKG Interpretation Date/Time:  Sunday April 30 2023 04:18:40 EDT Ventricular Rate:  105 PR Interval:  129 QRS Duration:  94 QT Interval:  353 QTC Calculation: 467 R Axis:   -40  Text Interpretation: Sinus tachycardia Left anterior fascicular block Confirmed by Kommor, Madison ([id number]) on 04/30/2023 4:48:57 AM  I have reviewed the labs performed to date as well as medications administered while in observation.  Recent changes in the last 24 hours include -psychiatry team is seeking admission.  Patient currently has depression with psychosis.  Plan  Current plan is for patient to be held for psychiatric stabilization.    Derwood Kaplan, MD 05/02/23 [phone]

## 2023-05-02 NOTE — Discharge Summary (Cosign Needed Addendum)
[facility] Psych ED Discharge  05/02/2023 10:46 AM DORN KARP  MRN:  [id number]  Principal Problem: MDD (major depressive disorder), recurrent, severe, with psychosis (HCC) Discharge Diagnoses: Principal Problem:   MDD (major depressive disorder), recurrent, severe, with psychosis (HCC)  Clinical Impression:  Final diagnoses:  None   Subjective:  Justin [name] is a 42 y.o. male patient admitted with psychiatry hx significant for Schizophrenia, Amphetamine use disorder, Amphetamine induced mood disorder, Cannabis use disorder, MDD and suicide ideations .originally came to the ER for seizure disorder and bilateral foot pain..  Patient was seen this morning awake and alert and responded to question after initially refusing to speak with provider.  He has been taking his Olanzapine, Escitalopram and his Keppra.  His HIV medication is also being offered here in the ER.  Patient has been sleeping and eating well and tolerating fluid as well.  He has no outpatient Psychiatrist and usually uses the ER for his Psychiatry care.    We discussed the need for him to establish Mental healthcare at [facility].  Patient also is advised to continue taking his Psychotropic medications as prescribed. Patient admits that he is homeless and have no place to go to.  He comes to the ER quite frequent to spend few days and gets admitted or discharged home.  Patient is alert and oriented x5, his medications are sent to his Pharmacy.  Patient is advised against using Amphetamines which could be contributing to his Psychosis.  Patient is Psychiatrically cleared.  DR Lucianne Muss reviewed discharge with provider and is in agreement to discharge patient.   ED Assessment Time Calculation: Start Time: 1014 Stop Time: 1037 Total Time in Minutes (Assessment Completion): 23   Past Psychiatric History: see initial psychiatry evaluation note  Past Medical History:  Past Medical History:  Diagnosis  Date   Chronic hepatitis C without hepatic coma (HCC) 08/17/2017   Endocarditis    History of syphilis 08/25/2014   Treated by [facility] 07-2014.   HIV (human immunodeficiency virus infection) (HCC)    Seizures (HCC)     Past Surgical History:  Procedure Laterality Date   hand surgery Right    LUMBAR PUNCTURE  08/08/2019       WRIST SURGERY     Family History:  Family History  Adopted: Yes  Problem Relation Age of Onset   Diabetes type II Mother    Family Psychiatric  History: see initial psychiatry evaluation note Social History:  Social History   Substance and Sexual Activity  Alcohol Use Yes   Comment: occ     Social History   Substance and Sexual Activity  Drug Use Yes   Types: Marijuana, Methamphetamines    Social History   Socioeconomic History   Marital status: Single    Spouse name: Not on file   Number of children: Not on file   Years of education: Not on file   Highest education level: Not on file  Occupational History   Not on file  Tobacco Use   Smoking status: Every Day    Types: Cigarettes   Smokeless tobacco: Never  Vaping Use   Vaping status: Never Used  Substance and Sexual Activity   Alcohol use: Yes    Comment: occ   Drug use: Yes    Types: Marijuana, Methamphetamines   Sexual activity: Not Currently    Partners: Male    Birth control/protection: Condom    Comment: given condoms  Other Topics Concern  Not on file  Social History Narrative   ** Merged History Encounter **       ** Merged History Encounter **       ** Merged History Encounter **       ** Merged History Encounter **       Social Determinants of Corporate investment banker Strain: Not on file  Food Insecurity: Food Insecurity Present (01/07/2023)   Hunger Vital Sign    Worried About Running Out of Food in the Last Year: Sometimes true    Ran Out of Food in the Last Year: Sometimes true  Transportation Needs: Unmet Transportation Needs (01/07/2023)   PRAPARE -  Administrator, Civil Service (Medical): Yes    Lack of Transportation (Non-Medical): Yes  Physical Activity: Not on file  Stress: Not on file  Social Connections: Not on file    Tobacco Cessation:  N/A, patient does not currently use tobacco products  Current Medications: Current Facility-Administered Medications  Medication Dose Route Frequency Provider Last Rate Last Admin   bictegravir-emtricitabine-tenofovir AF (BIKTARVY) 50-200-25 MG per tablet 1 tablet  1 tablet Oral Daily Kommor, [place], MD   1 tablet at 05/02/23 0931   escitalopram (LEXAPRO) tablet 20 mg  20 mg Oral Daily Dahlia Byes C, NP   20 mg at 05/02/23 0931   levETIRAcetam (KEPPRA) tablet 1,000 mg  1,000 mg Oral BID Loetta Rough, MD   1,000 mg at 05/02/23 0931   OLANZapine (ZYPREXA) tablet 10 mg  10 mg Oral QHS Dahlia Byes C, NP       Current Outpatient Medications  Medication Sig Dispense Refill   acetaminophen (TYLENOL) 500 MG tablet Take 500-1,000 mg by mouth every 6 (six) hours as needed for mild pain (pain score 1-3), moderate pain (pain score 4-6) or headache.     levETIRAcetam (KEPPRA) 1000 MG tablet Take 1 tablet (1,000 mg total) by mouth 2 (two) times daily. 60 tablet 0   [START ON 05/03/2023] bictegravir-emtricitabine-tenofovir AF (BIKTARVY) 50-200-25 MG TABS tablet Take 1 tablet by mouth daily. 30 tablet 0   [START ON 05/03/2023] escitalopram (LEXAPRO) 20 MG tablet Take 1 tablet (20 mg total) by mouth daily. 30 tablet 0   OLANZapine (ZYPREXA) 10 MG tablet Take 1 tablet (10 mg total) by mouth at bedtime. 30 tablet 0   PTA Medications: (Not in a hospital admission)   [place] Scale:  Flowsheet Row ED from 04/30/2023 in [facility] Emergency Department at [facility] ED from 04/07/2023 in [facility] Emergency Department at [facility] ED from 04/01/2023 in [facility] Emergency Department at [facility]  C-SSRS RISK CATEGORY Error: Question 6 not populated  High Risk No Risk       Musculoskeletal: Strength & Muscle Tone: within normal limits Gait & Station: normal Patient leans: Front  Psychiatric Specialty Exam: Presentation  General Appearance:  Casual  Eye Contact: Fleeting  Speech: Clear and Coherent; Normal Rate  Speech Volume: Normal  Handedness: Right   Mood and Affect  Mood: Depressed  Affect: Congruent   Thought Process  Thought Processes: Coherent; Goal Directed  Descriptions of Associations:Intact  Orientation:Full (Time, Place and Person)  Thought Content:Logical  History of Schizophrenia/Schizoaffective disorder:No  Duration of Psychotic Symptoms:Less than six months (In context of amphetamine use)  Hallucinations:Hallucinations: None Description of Auditory Hallucinations: voices telling him to hurt himself and others,  Ideas of Reference:None  Suicidal Thoughts:Suicidal Thoughts: No  Homicidal Thoughts:Homicidal Thoughts: No   Sensorium  Memory: Immediate Good; Recent Good; Remote Good  Judgment: Good  Insight: Good   Executive Functions  Concentration: Good  Attention Span: Good  Recall: Good  Fund of Knowledge: Good  Language: Good   Psychomotor Activity  Psychomotor Activity: Psychomotor Activity: Normal   Assets  Assets: Communication Skills; Desire for Improvement; Social Support   Sleep  Sleep: Sleep: Good    Physical Exam: Physical Exam Vitals and nursing note reviewed.  Constitutional:      Appearance: Normal appearance.  HENT:     Nose: Nose normal.  Cardiovascular:     Rate and Rhythm: Normal rate and regular rhythm.  Pulmonary:     Effort: Pulmonary effort is normal.  Musculoskeletal:        General: Normal range of motion.  Skin:    General: Skin is dry.  Neurological:     Mental Status: He is alert and oriented to person, place, and time.  Psychiatric:        Attention and Perception: Attention and perception normal.         Mood and Affect: Mood is depressed.        Speech: Speech normal.        Behavior: Behavior normal. Behavior is cooperative.        Thought Content: Thought content normal.        Cognition and Memory: Cognition and memory normal.    Review of Systems  Constitutional: Negative.   HENT: Negative.    Eyes: Negative.   Respiratory: Negative.    Cardiovascular: Negative.   Gastrointestinal: Negative.   Genitourinary: Negative.   Musculoskeletal: Negative.   Skin: Negative.   Neurological: Negative.   Endo/Heme/Allergies: Negative.   Psychiatric/Behavioral:  Positive for depression and substance abuse.    Blood pressure 110/68, pulse 63, temperature 98.7 F (37.1 C), temperature source Oral, resp. rate 18, SpO2 98%. There is no height or weight on file to calculate BMI.   Demographic Factors:  Male, Adolescent or young adult, Unemployed, and homeless  Loss Factors: Financial problems/change in socioeconomic status  Historical Factors: Impulsivity  Risk Reduction Factors:   Positive social support  Continued Clinical Symptoms:  Alcohol/Substance Abuse/Dependencies More than one psychiatric diagnosis Previous Psychiatric Diagnoses and Treatments  Cognitive Features That Contribute To Risk:  None    Suicide Risk:  Minimal: No identifiable suicidal ideation.  Patients presenting with no risk factors but with morbid ruminations; may be classified as minimal risk based on the severity of the depressive symptoms    Plan Of Care/Follow-up recommendations:  Activity:  as tolerated Diet:  Regular  Medical Decision Making: Patient has metabolized the Amphetamine in his system.  He is calm and cooperative.  He is not hearing voices today.  He has been advised to seek care Mental health care at  [facility] Mental health facility. Disposition: Psychiatrically cleared.  Earney Navy, NP-PMHNP-BC 05/02/2023, 10:46 AM

## 2023-05-02 NOTE — ED Notes (Signed)
Pt reports hearing voices that are making him anxious and is requesting zyprexa. Provider contacted and order obtained for medication as well as keppra level added to blood work.

## 2023-05-02 NOTE — BH Assessment (Signed)
TTS consult has been deferred to [facility]IS. [facility]IS Coordinator will reach out in secure chat with provider name and assessment time.

## 2023-05-02 NOTE — ED Notes (Signed)
Patient to room 27 . Patient oriented to unit and room.

## 2023-05-02 NOTE — ED Notes (Signed)
Triage RN Ian Malkin notified of feelings of having another seizures.

## 2023-05-02 NOTE — ED Provider Notes (Signed)
[place] EMERGENCY DEPARTMENT AT [facility] Provider Note   CSN: [id number] Arrival date & time: 05/02/23  1611     History  Chief Complaint  Patient presents with   Seizures   Hallucinations         Justin [name] is a 42 y.o. male with PMH as listed below who presents with seizure and SI. Patient states he was discharged this AM from the ED but never left the hospital campus. He states he had another seizure and has been hearing voices. When asked if he mentioned that to the psychiatrist this AM, he becomes upset and states that he did "but they yelled at me." States the voices are telling him to hurt himself and others. He states he is having pain "everywhere," including in his abdomen, lower back, and bilateral legs that began last night. He has long history of frequent presentations to ED w/ seizure-like activity and noncompliance with keppra.  Also has h/o HIV, homelessness, malingering, polysubstance use. He was initially sleeping soundly on my evaluation and is very well-appearing.   Past Medical History:  Diagnosis Date   Chronic hepatitis C without hepatic coma (HCC) 08/17/2017   Endocarditis    History of syphilis 08/25/2014   Treated by [facility] 07-2014.   HIV (human immunodeficiency virus infection) (HCC)    Seizures (HCC)        Home Medications Prior to Admission medications   Medication Sig Start Date End Date Taking? Authorizing Provider  acetaminophen (TYLENOL) 500 MG tablet Take 500-1,000 mg by mouth every 6 (six) hours as needed for mild pain (pain score 1-3), moderate pain (pain score 4-6) or headache.    [provider]  bictegravir-emtricitabine-tenofovir AF (BIKTARVY) 50-200-25 MG TABS tablet Take 1 tablet by mouth daily. 05/03/23 06/02/23  Earney Navy, NP  escitalopram (LEXAPRO) 20 MG tablet Take 1 tablet (20 mg total) by mouth daily. 05/03/23 06/02/23  Earney Navy, NP  levETIRAcetam (KEPPRA) 1000 MG tablet Take 1  tablet (1,000 mg total) by mouth 2 (two) times daily. 03/30/23   Rolan Bucco, MD  OLANZapine (ZYPREXA) 10 MG tablet Take 1 tablet (10 mg total) by mouth at bedtime. 05/02/23 06/01/23  Earney Navy, NP      Allergies    Patient has no known allergies.    Review of Systems   Review of Systems A 10 point review of systems was performed and is negative unless otherwise reported in HPI.  Physical Exam Updated Vital Signs BP (!) 140/82 (BP Location: Left Arm)   Pulse (!) 106   Temp 99.1 F (37.3 C) (Oral)   Resp 18   SpO2 98%  Physical Exam General: Normal appearing male, lying in bed.  HEENT: PERRLA, Sclera anicteric, MMM, trachea midline.  Cardiology: RRR, no murmurs/rubs/gallops. BL radial and DP pulses equal bilaterally.  Resp: Normal respiratory rate and effort. CTAB, no wheezes, rhonchi, crackles.  Abd: Diffusely mildly tender. Soft, non-distended. No rebound tenderness or guarding.  GU: Deferred. MSK: No peripheral edema or signs of trauma. Extremities without deformity or TTP. No cyanosis or clubbing. Skin: warm, dry.  Neuro: A&Ox4, CNs II-XII grossly intact. MAEs. Sensation grossly intact.  Psych: Normal mood and affect.   ED Results / Procedures / Treatments   Labs (all labs ordered are listed, but only abnormal results are displayed) Labs Reviewed  COMPREHENSIVE METABOLIC PANEL - Abnormal; Notable for the following components:      Result Value   Glucose, Bld 112 (*)  Total Bilirubin 1.9 (*)    All other components within normal limits  CBC WITH DIFFERENTIAL/PLATELET  LIPASE, BLOOD  LEVETIRACETAM LEVEL    EKG EKG Interpretation Date/Time:  Tuesday May 02 2023 16:25:38 EDT Ventricular Rate:  86 PR Interval:  88 QRS Duration:  86 QT Interval:  354 QTC Calculation: 423 R Axis:   -18  Text Interpretation: NSR with baseline artifact Confirmed by Vivi Barrack [phone]) on 05/02/2023 10:11:26 PM  Radiology No results  found.  Procedures Procedures    Medications Ordered in ED Medications  levETIRAcetam (KEPPRA) tablet 1,000 mg (1,000 mg Oral Given 05/02/23 1957)  OLANZapine (ZYPREXA) tablet 10 mg (10 mg Oral Given 05/02/23 2145)  acetaminophen (TYLENOL) tablet 650 mg (has no administration in time range)  OLANZapine zydis (ZYPREXA) disintegrating tablet 5 mg (5 mg Oral Given 05/02/23 2008)    ED Course/ Medical Decision Making/ A&P                          Medical Decision Making Amount and/or Complexity of Data Reviewed Labs: ordered.  Risk OTC drugs. Prescription drug management.    This patient presents to the ED for concern of SI, this involves an extensive number of treatment options, and is a complaint that carries with it a high risk of complications and morbidity.  I considered the following differential and admission for this acute, potentially life threatening condition.   MDM:    Patient is A&Ox4, endorses suicidal ideation. No e/o postictal state. Labs are reassuring against causes of seizure. Will get keppra level. He has no signs of surgical abdomen on exam and abdominal labs also reassuring, he is resting comfortably, low c/f acute life threatening cause of abdominal pain. Has taken PO without difficulty and denies urinary sxs.   Will consult to pyschiatry for evaluation. Medically cleared. Placed on psych hold w/ SI precautions/safety observation.   Clinical Course as of 05/02/23 2336  Tue May 02, 2023  2211 Patient given his home keppra and keppra level was also drawn. Given home zyprexa 10 mg PO every at bedtime as well as a zyprexa 5 mg PRN. Patient with reassuring labs and vitals signs, will be medically cleared and consulted to psychiatry.  [HN]    Clinical Course User Index [HN] Loetta Rough, MD    Labs: I Ordered, and personally interpreted labs.  The pertinent results include:  those listed above  Additional history obtained from chart review.    Social  Determinants of Health: Lives independently  Disposition:  [facility]  Co morbidities that complicate the patient evaluation  Past Medical History:  Diagnosis Date   Chronic hepatitis C without hepatic coma (HCC) 08/17/2017   Endocarditis    History of syphilis 08/25/2014   Treated by [facility] 07-2014.   HIV (human immunodeficiency virus infection) (HCC)    Seizures (HCC)      Medicines Meds ordered this encounter  Medications   levETIRAcetam (KEPPRA) tablet 1,000 mg   OLANZapine (ZYPREXA) tablet 10 mg   OLANZapine zydis (ZYPREXA) disintegrating tablet 5 mg   acetaminophen (TYLENOL) tablet 650 mg    I have reviewed the patients home medicines and have made adjustments as needed  Problem List / ED Course: Problem List Items Addressed This Visit       Nervous and Auditory   Seizure disorder (HCC) - Primary   Relevant Medications   levETIRAcetam (KEPPRA) tablet 1,000 mg   Other Visit Diagnoses  Suicidal thoughts                       This note was created using dictation software, which may contain spelling or grammatical errors.    Loetta Rough, MD 05/02/23 [phone]

## 2023-05-02 NOTE — ED Notes (Signed)
Patient woke up .  Patient medication compliant.  No suicidal ideation noted.

## 2023-05-02 NOTE — ED Notes (Signed)
Patient walked out of room.  Patient being discharged. Belongings given to patietn Ambulated off unit.  Bus pass given to patient for transport.

## 2023-05-03 ENCOUNTER — Ambulatory Visit (HOSPITAL_COMMUNITY)
Admission: EM | Admit: 2023-05-03 | Discharge: 2023-05-04 | Disposition: A | Discharge status: Other Acute Inpatient Hospital | Payer: MEDICAID | Attending: Psychiatry | Admitting: Psychiatry

## 2023-05-03 ENCOUNTER — Other Ambulatory Visit: Payer: Self-pay | Admitting: Internal Medicine

## 2023-05-03 DIAGNOSIS — Z765 Malingerer [conscious simulation]: Secondary | ICD-10-CM

## 2023-05-03 DIAGNOSIS — R45850 Homicidal ideations: Secondary | ICD-10-CM | POA: Insufficient documentation

## 2023-05-03 DIAGNOSIS — R4689 Other symptoms and signs involving appearance and behavior: Secondary | ICD-10-CM

## 2023-05-03 DIAGNOSIS — G40909 Epilepsy, unspecified, not intractable, without status epilepticus: Secondary | ICD-10-CM | POA: Insufficient documentation

## 2023-05-03 DIAGNOSIS — F333 Major depressive disorder, recurrent, severe with psychotic symptoms: Secondary | ICD-10-CM

## 2023-05-03 DIAGNOSIS — R45851 Suicidal ideations: Secondary | ICD-10-CM | POA: Diagnosis not present

## 2023-05-03 DIAGNOSIS — Z21 Asymptomatic human immunodeficiency virus [HIV] infection status: Secondary | ICD-10-CM | POA: Insufficient documentation

## 2023-05-03 DIAGNOSIS — F259 Schizoaffective disorder, unspecified: Secondary | ICD-10-CM

## 2023-05-03 DIAGNOSIS — Z5900 Homelessness unspecified: Secondary | ICD-10-CM

## 2023-05-03 MED ORDER — OLANZAPINE 10 MG PO TBDP
10.0000 mg | ORAL_TABLET | Freq: Three times a day (TID) | ORAL | Status: DC | PRN
  Administered 2023-05-04: 10 mg via ORAL
  Filled 2023-05-03: qty 1

## 2023-05-03 MED ORDER — LORAZEPAM 1 MG PO TABS: 2.0000 mg | ORAL_TABLET | Freq: Four times a day (QID) | ORAL | Status: DC | PRN

## 2023-05-03 MED ORDER — ZIPRASIDONE MESYLATE 20 MG IM SOLR: 20.0000 mg | INTRAMUSCULAR | Status: DC | PRN

## 2023-05-03 MED ORDER — LORAZEPAM 1 MG PO TABS
2.0000 mg | ORAL_TABLET | Freq: Four times a day (QID) | ORAL | Status: DC | PRN
  Administered 2023-05-04: 2 mg via ORAL
  Filled 2023-05-03: qty 2

## 2023-05-03 MED ORDER — ESCITALOPRAM OXALATE 10 MG PO TABS
20.0000 mg | ORAL_TABLET | Freq: Every day | ORAL | Status: DC
  Administered 2023-05-03: 20 mg via ORAL
  Filled 2023-05-03: qty 2

## 2023-05-03 MED ORDER — ALUM & MAG HYDROXIDE-SIMETH 200-200-20 MG/5ML PO SUSP: 30.0000 mL | ORAL | Status: DC | PRN

## 2023-05-03 MED ORDER — MAGNESIUM HYDROXIDE 400 MG/5ML PO SUSP: 30.0000 mL | Freq: Every day | ORAL | Status: DC | PRN

## 2023-05-03 MED ORDER — LORAZEPAM 2 MG/ML IJ SOLN
INTRAMUSCULAR | Status: AC
  Administered 2023-05-03: 2 mg
  Filled 2023-05-03: qty 1

## 2023-05-03 MED ORDER — ACETAMINOPHEN 325 MG PO TABS: 650.0000 mg | ORAL_TABLET | Freq: Four times a day (QID) | ORAL | Status: DC | PRN

## 2023-05-03 NOTE — ED Provider Notes (Signed)
Pt has been psych cleared.  He is stable for d/c.   Jacalyn Lefevre, MD 05/03/23 1710

## 2023-05-03 NOTE — ED Provider Notes (Signed)
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   Barbiturates 03/29/2023 NONE DETECTED  NONE DETECTED Final   Comment: (NOTE) DRUG SCREEN FOR MEDICAL PURPOSES ONLY.  IF CONFIRMATION IS NEEDED FOR ANY PURPOSE, NOTIFY LAB WITHIN 5 DAYS.  LOWEST DETECTABLE LIMITS FOR URINE DRUG SCREEN Drug Class                     Cutoff  (ng/mL) Amphetamine and metabolites    1000 Barbiturate and metabolites    200 Benzodiazepine                 200 Opiates and metabolites        300 Cocaine and metabolites        300 THC                            50 Performed at [facility], [address], [place], [place] [address]    Glucose-Capillary 03/29/2023 150 (H)  70 - 99 mg/dL Final   Glucose reference range applies only to samples taken after fasting for at least 8 hours.  Admission on 03/26/2023, Discharged on 03/26/2023  Component Date Value Ref Range Status   Glucose-Capillary 03/26/2023 86  70 - 99 mg/dL Final   Glucose reference range applies only to samples taken after fasting for at least 8 hours.  Admission on 03/26/2023, Discharged on 03/26/2023  Component Date Value Ref Range Status   Sodium 03/26/2023 135  135 - 145 mmol/L Final   Potassium 03/26/2023 3.0 (L)  3.5 - 5.1 mmol/L Final   Chloride 03/26/2023 100  98 - 111 mmol/L Final   CO2 03/26/2023 19 (L)  22 - 32 mmol/L Final   Glucose, Bld 03/26/2023 90  70 - 99 mg/dL Final   Glucose reference range applies only to samples taken after fasting for at least 8 hours.   BUN 03/26/2023 18  6 - 20 mg/dL Final   Creatinine, Ser 03/26/2023 1.26 (H)  0.61 - 1.24 mg/dL Final   Calcium 78/29/5621 9.0  8.9 - 10.3 mg/dL Final   GFR, Estimated 03/26/2023 >60  >60 mL/min Final   Comment: (NOTE) Calculated using the CKD-EPI Creatinine Equation (2021)    Anion gap 03/26/2023 16 (H)  5 - 15 Final   Performed at [facility], [address] [place], [place] [address]   WBC 03/26/2023 7.4  4.0 - 10.5 K/uL Final   RBC 03/26/2023 5.06  4.22 - 5.81 MIL/uL Final   Hemoglobin 03/26/2023 16.0  13.0 - 17.0 g/dL Final   HCT 78/46/9629 46.8  39.0 - 52.0 % Final   MCV 03/26/2023 92.5  80.0 - 100.0 fL Final   MCH 03/26/2023 31.6  26.0 - 34.0 pg Final   MCHC 03/26/2023  34.2  30.0 - 36.0 g/dL Final   RDW 16/04/9603 12.2  11.5 - 15.5 % Final   Platelets 03/26/2023 233   150 - 400 K/uL Final   nRBC 03/26/2023 0.0  0.0 - 0.2 % Final   Neutrophils Relative % 03/26/2023 49  % Final   Neutro Abs 03/26/2023 3.6  1.7 - 7.7 K/uL Final   Lymphocytes Relative 03/26/2023 37  % Final   Lymphs Abs 03/26/2023 2.8  0.7 - 4.0 K/uL Final   Monocytes Relative 03/26/2023 11  % Final   Monocytes Absolute 03/26/2023 0.8  0.1 - 1.0 K/uL Final   Eosinophils Relative 03/26/2023 2  % Final   Eosinophils Absolute 03/26/2023 0.1  0.0 - 0.5 K/uL Final   Basophils Relative 03/26/2023 1  % Final   Basophils Absolute 03/26/2023 0.0  0.0 - 0.1 K/uL Final   Immature Granulocytes 03/26/2023 0  % Final   Abs Immature Granulocytes 03/26/2023 0.02  0.00 - 0.07 K/uL Final   Performed at [facility], [address] [place], [place] [address]   Magnesium 03/26/2023 1.8  1.7 - 2.4 mg/dL Final   Performed at [facility], [address], [place], [place] [address]  Admission on 03/23/2023, Discharged on 03/23/2023  Component Date Value Ref Range Status   Glucose-Capillary 03/23/2023 128 (H)  70 - 99 mg/dL Final   Glucose reference range applies only to samples taken after fasting for at least 8 hours.   Sodium 03/23/2023 142  135 - 145 mmol/L Final   Potassium 03/23/2023 3.3 (L)  3.5 - 5.1 mmol/L Final   Chloride 03/23/2023 107  98 - 111 mmol/L Final   CO2 03/23/2023 25  22 - 32 mmol/L Final   Glucose, Bld 03/23/2023 108 (H)  70 - 99 mg/dL Final   Glucose reference range applies only to samples taken after fasting for at least 8 hours.   BUN 03/23/2023 13  6 - 20 mg/dL Final   Creatinine, Ser 03/23/2023 1.00  0.61 - 1.24 mg/dL Final   Calcium 78/29/5621 9.0  8.9 - 10.3 mg/dL Final   Total Protein 30/86/5784 7.4  6.5 - 8.1 g/dL Final   Albumin 69/62/9528 4.1  3.5 - 5.0 g/dL Final   AST 41/32/4401 34  15 - 41 U/L Final   ALT 03/23/2023 36  0 - 44 U/L Final   Alkaline Phosphatase 03/23/2023 63  38 - 126 U/L Final   Total Bilirubin 03/23/2023 1.8 (H)  0.3 - 1.2 mg/dL Final    GFR, Estimated 03/23/2023 >60  >60 mL/min Final   Comment: (NOTE) Calculated using the CKD-EPI Creatinine Equation (2021)    Anion gap 03/23/2023 10  5 - 15 Final   Performed at [facility], [address], [place], [place] [address]   WBC 03/23/2023 5.7  4.0 - 10.5 K/uL Final   RBC 03/23/2023 4.69  4.22 - 5.81 MIL/uL Final   Hemoglobin 03/23/2023 15.1  13.0 - 17.0 g/dL Final   HCT 36/64/4034 45.4  39.0 - 52.0 % Final   MCV 03/23/2023 96.8  80.0 - 100.0 fL Final   MCH 03/23/2023 32.2  26.0 - 34.0 pg Final   MCHC 03/23/2023 33.3  30.0 - 36.0 g/dL Final   RDW 74/25/9563 12.6  11.5 - 15.5 % Final   Platelets 03/23/2023 208  150 - 400 K/uL Final   nRBC 03/23/2023 0.0  0.0 - 0.2 % Final   Neutrophils Relative % 03/23/2023 62  %  Final   Neutro Abs 03/23/2023 3.6  1.7 - 7.7 K/uL Final   Lymphocytes Relative 03/23/2023 25  % Final   Lymphs Abs 03/23/2023 1.5  0.7 - 4.0 K/uL Final   Monocytes Relative 03/23/2023 11  % Final   Monocytes Absolute 03/23/2023 0.6  0.1 - 1.0 K/uL Final   Eosinophils Relative 03/23/2023 1  % Final   Eosinophils Absolute 03/23/2023 0.0  0.0 - 0.5 K/uL Final   Basophils Relative 03/23/2023 1  % Final   Basophils Absolute 03/23/2023 0.0  0.0 - 0.1 K/uL Final   Immature Granulocytes 03/23/2023 0  % Final   Abs Immature Granulocytes 03/23/2023 0.01  0.00 - 0.07 K/uL Final   Performed at [facility], [address], [place], [place] [address]   Magnesium 03/23/2023 2.2  1.7 - 2.4 mg/dL Final   Performed at [facility], [address] [place], [place] [address]   Specimen Source 03/23/2023 URINE, CLEAN CATCH   Final   Color, Urine 03/23/2023 AMBER (A)  YELLOW Final   BIOCHEMICALS MAY BE AFFECTED BY COLOR   APPearance 03/23/2023 CLEAR  CLEAR Final   Specific Gravity, Urine 03/23/2023 1.030  1.005 - 1.030 Final   pH 03/23/2023 6.0  5.0 - 8.0 Final   Glucose, UA 03/23/2023 NEGATIVE  NEGATIVE mg/dL Final   Hgb  urine dipstick 03/23/2023 NEGATIVE  NEGATIVE Final   Bilirubin Urine 03/23/2023 NEGATIVE  NEGATIVE Final   Ketones, ur 03/23/2023 5 (A)  NEGATIVE mg/dL Final   Protein, ur 82/95/6213 NEGATIVE  NEGATIVE mg/dL Final   Nitrite 08/65/7846 NEGATIVE  NEGATIVE Final   Leukocytes,Ua 03/23/2023 NEGATIVE  NEGATIVE Final   RBC / HPF 03/23/2023 0-5  0 - 5 RBC/hpf Final   WBC, UA 03/23/2023 0-5  0 - 5 WBC/hpf Final   Comment:        Reflex urine culture not performed if WBC <=10, OR if Squamous epithelial cells >5. If Squamous epithelial cells >5 suggest recollection.    Bacteria, UA 03/23/2023 NONE SEEN  NONE SEEN Final   Squamous Epithelial / HPF 03/23/2023 0-5  0 - 5 /HPF Final   Mucus 03/23/2023 PRESENT   Final   Performed at [facility], [address] [place], [place] [address]   Opiates 03/23/2023 NONE DETECTED  NONE DETECTED Final   Cocaine 03/23/2023 NONE DETECTED  NONE DETECTED Final   Benzodiazepines 03/23/2023 NONE DETECTED  NONE DETECTED Final   Amphetamines 03/23/2023 NONE DETECTED  NONE DETECTED Final   Tetrahydrocannabinol 03/23/2023 NONE DETECTED  NONE DETECTED Final   Barbiturates 03/23/2023 NONE DETECTED  NONE DETECTED Final   Comment: (NOTE) DRUG SCREEN FOR MEDICAL PURPOSES ONLY.  IF CONFIRMATION IS NEEDED FOR ANY PURPOSE, NOTIFY LAB WITHIN 5 DAYS.  LOWEST DETECTABLE LIMITS FOR URINE DRUG SCREEN Drug Class                     Cutoff (ng/mL) Amphetamine and metabolites    1000 Barbiturate and metabolites    200 Benzodiazepine                 200 Opiates and metabolites        300 Cocaine and metabolites        300 THC                            50 Performed at [facility], [address] [place],  [place] [address]    Alcohol, Ethyl (B) 03/23/2023 <10  <10 mg/dL Final   Comment: (NOTE) Lowest detectable limit for serum alcohol is 10 mg/dL.  For medical purposes only. Performed at [facility], [address], [place], [place] [address]    Levetiracetam Lvl 03/23/2023 25.9  10.0 - 40.0 ug/mL Final   Comment: (NOTE) Performed At: [facility] [address] [place], [place] [id number] Jolene Schimke MD WG:[id number]   There may be more visits with results that are not included.    Allergies: Patient has no known allergies.  Medications:  Facility Ordered Medications  Medication   acetaminophen (TYLENOL) tablet 650 mg   alum & mag hydroxide-simeth (MAALOX/MYLANTA) 200-200-20 MG/5ML suspension 30 mL   magnesium hydroxide (MILK OF MAGNESIA) suspension 30 mL   OLANZapine zydis (ZYPREXA) disintegrating tablet 10 mg   And   ziprasidone (GEODON) injection 20 mg   LORazepam (ATIVAN) tablet 2 mg   Or   LORazepam (ATIVAN) tablet 2 mg   [COMPLETED] LORazepam (ATIVAN) 2 MG/ML injection   [COMPLETED] traZODone (DESYREL) tablet 100 mg   levETIRAcetam (KEPPRA) tablet 1,000 mg   escitalopram (LEXAPRO) tablet 20 mg   bictegravir-emtricitabine-tenofovir AF (BIKTARVY) 50-200-25 MG per tablet 1 tablet   OLANZapine (ZYPREXA) tablet 10 mg   PTA Medications  Medication Sig   acetaminophen (TYLENOL) 500 MG tablet Take 500-1,000 mg by mouth every 6 (six) hours as needed for mild pain (pain score 1-3), moderate pain (pain score 4-6) or headache.   levETIRAcetam (KEPPRA) 1000 MG tablet Take 1 tablet (1,000 mg total) by mouth 2 (two) times daily.   bictegravir-emtricitabine-tenofovir AF (BIKTARVY) 50-200-25 MG TABS tablet Take 1 tablet by mouth daily.   escitalopram (LEXAPRO) 20 MG tablet Take 1 tablet (20 mg total) by mouth daily.   OLANZapine (ZYPREXA) 10 MG tablet Take 1 tablet (10 mg total) by mouth at bedtime.      Medical Decision Making  Inpatient observation    Lab Orders         SARS Coronavirus 2 by RT PCR (hospital order, performed in [facility] hospital lab) *cepheid single result test* Anterior Nasal Swab         Ethanol      Meds ordered this encounter  Medications    acetaminophen (TYLENOL) tablet 650 mg   alum & mag hydroxide-simeth (MAALOX/MYLANTA) 200-200-20 MG/5ML suspension 30 mL   magnesium hydroxide (MILK OF MAGNESIA) suspension 30 mL   AND Linked Order Group    OLANZapine zydis (ZYPREXA) disintegrating tablet 10 mg    ziprasidone (GEODON) injection 20 mg   OR Linked Order Group    LORazepam (ATIVAN) tablet 2 mg    LORazepam (ATIVAN) tablet 2 mg   LORazepam (ATIVAN) 2 MG/ML injection    Marzetta Board M: cabinet override   traZODone (DESYREL) tablet 100 mg   levETIRAcetam (KEPPRA) tablet 1,000 mg   escitalopram (LEXAPRO) tablet 20 mg   bictegravir-emtricitabine-tenofovir AF (BIKTARVY) 50-200-25 MG per tablet 1 tablet   OLANZapine (ZYPREXA) tablet 10 mg    Recommendations  Based on my evaluation the patient does not appear to have an emergency medical condition.  Sindy Guadeloupe, NP 05/04/23  6:06 AM

## 2023-05-03 NOTE — Consult Note (Addendum)
[facility] Consult Note  Patient Name: Justin [name] MRN: [id number] DOB: [birth year] DATE OF Consult: 05/03/2023  PRIMARY PSYCHIATRIC DIAGNOSES  1.  Suicidal ideations 2.  MDD with psychosis  RECOMMENDATIONS  Admit to inpatient psych for safety and stabilization.   Medication recommendations:  Continue  Zyprexa 10mg  PO QHS for hallucinations Continue Lexapro 20mg  daily for depression   Non-Medication/therapeutic recommendations: Refer to outpatient psychiatric provider for medication management and therapy upon discharge from inpatient psych admission.   Communication: Treatment team members (and family members if applicable) who were involved in treatment/care discussions and planning, and with whom we spoke or engaged with via secure text/chat, include the following:  treatment team  Thank you for involving [place] in the care of this patient. If you have any additional questions or concerns, please call [phone] and ask for me or the provider on-call.  TELEPSYCHIATRY ATTESTATION & CONSENT  As the provider for this telehealth consult, I attest that I verified the patient's identity using two separate identifiers, introduced myself to the patient, provided my credentials, disclosed my location, and performed this encounter via a HIPAA-compliant, real-time, face-to-face, two-way, interactive audio and video platform and with the full consent and agreement of the patient (or guardian as applicable.)  Patient physical location: [facility]. Telehealth provider physical location: home office in state of [place].  Video start time: 0017 (Central Time) Video end time: 0024 (Central Time)  IDENTIFYING DATA  Justin [name] is a 42 y.o. year-old male for whom a psychiatric consultation has been ordered by the primary provider. The patient was identified using two separate identifiers.  CHIEF COMPLAINT/REASON FOR CONSULT  "I keep hearing voices telling me kill myself and other  people"  HISTORY OF PRESENT ILLNESS (HPI)  The patient is a 42 year old male who presents to the ER for evaluation tonight reporting suicidal ideations and auditory hallucinations.   Per chart review patient was seen by psychiatry this morning. See note below: Subjective: Justin [name] is a 42 y.o. male patient admitted with psychiatry hx significant for Schizophrenia, Amphetamine use disorder, Amphetamine induced mood disorder, Cannabis use disorder, MDD and suicide ideations .originally came to the ER for seizure disorder and bilateral foot pain..  Patient was seen this morning awake and alert and responded to question after initially refusing to speak with provider. He has been taking his Olanzapine, Escitalopram and his Keppra. His HIV medication is also being offered here in the ER. Patient has been sleeping and eating well and tolerating fluid as well. He has no outpatient Psychiatrist and usually uses the ER for his Psychiatry care.  We discussed the need for him to establish Mental healthcare at [facility]. Patient also is advised to continue taking his Psychotropic medications as prescribed. Patient admits that he is homeless and have no place to go to. He comes to the ER quite frequent to spend few days and gets admitted or discharged home. Patient is alert and oriented x5, his medications are sent to his Pharmacy. Patient is advised against using Amphetamines which could be contributing to his Psychosis. Patient is Psychiatrically cleared. DR Lucianne Muss reviewed discharge with provider and is in agreement to discharge patient.  During this evaluation, patient reports hearing voices telling to kill himself and other people. The patient mentioned having a seizure either today or yesterday morning. He is evasive and irritable and not offering much during conversation. He does however reports feeling depressed, hopeless and worthless.   PAST PSYCHIATRIC HISTORY  Has a  history of inpatient mental health admissions. Denies having any current outpatient psych services  PAST MEDICAL HISTORY  Past Medical History:  Diagnosis Date   Chronic hepatitis C without hepatic coma (HCC) 08/17/2017   Endocarditis    History of syphilis 08/25/2014   Treated by [facility] 07-2014.   HIV (human immunodeficiency virus infection) (HCC)    Seizures (HCC)      HOME MEDICATIONS  Facility Ordered Medications  Medication   levETIRAcetam (KEPPRA) tablet 1,000 mg   OLANZapine (ZYPREXA) tablet 10 mg   [COMPLETED] OLANZapine zydis (ZYPREXA) disintegrating tablet 5 mg   acetaminophen (TYLENOL) tablet 650 mg   PTA Medications  Medication Sig   acetaminophen (TYLENOL) 500 MG tablet Take 500-1,000 mg by mouth every 6 (six) hours as needed for mild pain (pain score 1-3), moderate pain (pain score 4-6) or headache.   levETIRAcetam (KEPPRA) 1000 MG tablet Take 1 tablet (1,000 mg total) by mouth 2 (two) times daily.   bictegravir-emtricitabine-tenofovir AF (BIKTARVY) 50-200-25 MG TABS tablet Take 1 tablet by mouth daily.   escitalopram (LEXAPRO) 20 MG tablet Take 1 tablet (20 mg total) by mouth daily.   OLANZapine (ZYPREXA) 10 MG tablet Take 1 tablet (10 mg total) by mouth at bedtime.     ALLERGIES  No Known Allergies  SOCIAL & SUBSTANCE USE HISTORY  Social History   Socioeconomic History   Marital status: Single    Spouse name: Not on file   Number of children: Not on file   Years of education: Not on file   Highest education level: Not on file  Occupational History   Not on file  Tobacco Use   Smoking status: Every Day    Types: Cigarettes   Smokeless tobacco: Never  Vaping Use   Vaping status: Never Used  Substance and Sexual Activity   Alcohol use: Yes    Comment: occ   Drug use: Yes    Types: Marijuana, Methamphetamines   Sexual activity: Not Currently    Partners: Male    Birth control/protection: Condom    Comment: given condoms  Other Topics Concern   Not on  file  Social History Narrative   ** Merged History Encounter **       ** Merged History Encounter **       ** Merged History Encounter **       ** Merged History Encounter **       Social Determinants of Corporate investment banker Strain: Not on file  Food Insecurity: Food Insecurity Present (01/07/2023)   Hunger Vital Sign    Worried About Running Out of Food in the Last Year: Sometimes true    Ran Out of Food in the Last Year: Sometimes true  Transportation Needs: Unmet Transportation Needs (01/07/2023)   PRAPARE - Administrator, Civil Service (Medical): Yes    Lack of Transportation (Non-Medical): Yes  Physical Activity: Not on file  Stress: Not on file  Social Connections: Not on file   Social History   Tobacco Use  Smoking Status Every Day   Types: Cigarettes  Smokeless Tobacco Never   Social History   Substance and Sexual Activity  Alcohol Use Yes   Comment: occ   Social History   Substance and Sexual Activity  Drug Use Yes   Types: Marijuana, Methamphetamines    Additional pertinent information:   FAMILY HISTORY  Family History  Adopted: Yes  Problem Relation Age of Onset   Diabetes type  II Mother    Family Psychiatric History (if known):  Per chart review, patient reports that his family history is significant for mental health illnesses, including schizophrenia, bipolar disorder, and depression, notably in his mother and brother, both of whom have also attempted suicide. Additionally, there is a history of drug addiction within the family   MENTAL STATUS EXAM (MSE)  Presentation  General Appearance:  Appropriate for Environment  Eye Contact: Fair  Speech: Clear and Coherent  Speech Volume: Normal  Handedness: Right   Mood and Affect  Mood: Irritable; Hopeless; Depressed; Worthless  Affect: Solicitor Processes: Coherent  Descriptions of Associations: Intact  Orientation: Full (Time,  Place and Person)  Thought Content: Logical  History of Schizophrenia/Schizoaffective disorder: No  Duration of Psychotic Symptoms: Less than six months (In context of amphetamine use)  Hallucinations: Hallucinations: Auditory  Ideas of Reference: None  Suicidal Thoughts: Suicidal Thoughts: Yes, Active  Homicidal Thoughts: Homicidal Thoughts: No   Sensorium  Memory: Immediate Good; Recent Good; Remote Good  Judgment: Fair  Insight: Fair   Chartered certified accountant: Good  Attention Span: Good  Recall: Good  Fund of Knowledge: Good  Language: Good   Psychomotor Activity  Psychomotor Activity: Psychomotor Activity: Normal   Assets  Assets: Communication Skills   Sleep  Sleep: Sleep: Good   VITALS  Blood pressure (!) 140/82, pulse (!) 106, temperature 99.1 F (37.3 C), temperature source Oral, resp. rate 18, SpO2 98%.  LABS  Admission on 05/02/2023  Component Date Value Ref Range Status   WBC 05/02/2023 8.1  4.0 - 10.5 K/uL Final   RBC 05/02/2023 4.82  4.22 - 5.81 MIL/uL Final   Hemoglobin 05/02/2023 15.7  13.0 - 17.0 g/dL Final   HCT 83/41/9622 46.8  39.0 - 52.0 % Final   MCV 05/02/2023 97.1  80.0 - 100.0 fL Final   MCH 05/02/2023 32.6  26.0 - 34.0 pg Final   MCHC 05/02/2023 33.5  30.0 - 36.0 g/dL Final   RDW 29/79/8921 11.9  11.5 - 15.5 % Final   Platelets 05/02/2023 206  150 - 400 K/uL Final   nRBC 05/02/2023 0.0  0.0 - 0.2 % Final   Neutrophils Relative % 05/02/2023 71  % Final   Neutro Abs 05/02/2023 5.7  1.7 - 7.7 K/uL Final   Lymphocytes Relative 05/02/2023 23  % Final   Lymphs Abs 05/02/2023 1.8  0.7 - 4.0 K/uL Final   Monocytes Relative 05/02/2023 6  % Final   Monocytes Absolute 05/02/2023 0.5  0.1 - 1.0 K/uL Final   Eosinophils Relative 05/02/2023 0  % Final   Eosinophils Absolute 05/02/2023 0.0  0.0 - 0.5 K/uL Final   Basophils Relative 05/02/2023 0  % Final   Basophils Absolute 05/02/2023 0.0  0.0 - 0.1 K/uL  Final   Immature Granulocytes 05/02/2023 0  % Final   Abs Immature Granulocytes 05/02/2023 0.03  0.00 - 0.07 K/uL Final   Performed at [facility], [address], [place], [place] [address]   Sodium 05/02/2023 139  135 - 145 mmol/L Final   Potassium 05/02/2023 3.6  3.5 - 5.1 mmol/L Final   Chloride 05/02/2023 103  98 - 111 mmol/L Final   CO2 05/02/2023 25  22 - 32 mmol/L Final   Glucose, Bld 05/02/2023 112 (H)  70 - 99 mg/dL Final   Glucose reference range applies only to samples taken after fasting for at least 8 hours.   BUN 05/02/2023  14  6 - 20 mg/dL Final   Creatinine, Ser 05/02/2023 0.95  0.61 - 1.24 mg/dL Final   Calcium 16/04/9603 9.1  8.9 - 10.3 mg/dL Final   Total Protein 54/03/8118 7.4  6.5 - 8.1 g/dL Final   Albumin 14/78/2956 4.2  3.5 - 5.0 g/dL Final   AST 21/30/8657 25  15 - 41 U/L Final   ALT 05/02/2023 27  0 - 44 U/L Final   Alkaline Phosphatase 05/02/2023 68  38 - 126 U/L Final   Total Bilirubin 05/02/2023 1.9 (H)  0.3 - 1.2 mg/dL Final   GFR, Estimated 05/02/2023 >60  >60 mL/min Final   Comment: (NOTE) Calculated using the CKD-EPI Creatinine Equation (2021)    Anion gap 05/02/2023 11  5 - 15 Final   Performed at [facility], [address], [place], [place] [address]   Lipase 05/02/2023 33  11 - 51 U/L Final   Performed at [facility], [address] [place], [place] [address]    PSYCHIATRIC REVIEW OF SYSTEMS (ROS)  Patient is depressed, having suicidal ideations and auditory hallucinations  Additional findings:      Musculoskeletal: No abnormal movements observed      Gait & Station: Laying/Sitting      Pain Screening: Denies      Nutrition & Dental Concerns: none reported  RISK FORMULATION/ASSESSMENT  Is the patient experiencing any suicidal or homicidal ideations: Yes       Explain if yes:  Protective factors considered for safety management: access to appropriate psych care  Risk  factors/concerns considered for safety management:  Prior attempt Depression Substance abuse/dependence Male gender Unmarried  Is there a safety management plan with the patient and treatment team to minimize risk factors and promote protective factors: Yes           Explain: admit to psych Is crisis care placement or psychiatric hospitalization recommended: Yes     Based on my current evaluation and risk assessment, patient is determined at this time to be at:  Moderate Risk  *RISK ASSESSMENT Risk assessment is a dynamic process; it is possible that this patient's condition, and risk level, may change. This should be re-evaluated and managed over time as appropriate. Please re-consult psychiatric consult services if additional assistance is needed in terms of risk assessment and management. If your team decides to discharge this patient, please advise the patient how to best access emergency psychiatric services, or to call 911, if their condition worsens or they feel unsafe in any way.   Norval Morton, NP Tele[facility] Consult Services
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
## 2023-05-03 NOTE — ED Provider Notes (Signed)
Emergency Medicine Observation Re-evaluation Note  Justin [name] is a 42 y.o. male, seen on rounds today.  Pt initially presented to the ED for complaints of Seizures and Hallucinations (/) Currently, the patient is sleeping.  Physical Exam  BP 120/81 (BP Location: Left Arm)   Pulse 92   Temp 98.2 F (36.8 C) (Oral)   Resp 18   SpO2 99%  Physical Exam General: Sleeping no distress Cardiac: Regular rate and rhythm Lungs: No increased work of breathing Psych: Calm  ED Course / MDM  EKG:EKG Interpretation Date/Time:  Tuesday May 02 2023 16:25:38 EDT Ventricular Rate:  86 PR Interval:  88 QRS Duration:  86 QT Interval:  354 QTC Calculation: 423 R Axis:   -18  Text Interpretation: NSR with baseline artifact Confirmed by Vivi Barrack [id number]) on 05/02/2023 10:11:26 PM  I have reviewed the labs performed to date as well as medications administered while in observation.  Recent changes in the last 24 hours include ongoing eval by behavioral health.  Plan  Current plan is for placement.    Gerhard Munch, MD 05/03/23 [phone]

## 2023-05-03 NOTE — Progress Notes (Signed)
LCSW Progress Note  [id number]   Justin [name]  05/03/2023  2:17 AM    Inpatient Behavioral Health Placement  Pt meets inpatient criteria per Norval Morton, NP Telepsychiatry Consult Services. There are no available beds within [facility] system per [facility] AC Kim Brooks,RN. Referral was sent to the following facilities;   Destination  Service Provider Address Phone Fax  [facility]  [address] [place] [address] [phone]  [facility]  [address] [place], [place] [address] [phone]  [facility] [address]  [address] [place] [address] [phone]  [facility]  [address] [place], [place] [address] [phone]  [facility]  [address] [place] [address] [phone]  [facility]  [address] [place], [place] [address] [phone]  [facility]  [address] [place] [address] [phone]  [facility]  [address], [place] [address] [phone]  [facility]  [address] [place] [address] [phone]  [facility]  [address] [place] [address] [phone]  [facility]  [address] [place] [address] [phone]  [facility]  [place] [phone]  [facility] EFAX  [address] [place], [place] [phone]  [facility]  [address] [place] [address] [phone]  [facility]  [address] [place] [address] [phone]  [facility]  [address] [place] [address]  [phone]  [facility]  [address] [place] [address] [phone]  [facility]  [address], [place] [address] [phone]  [facility]  [address] [place] [address] [phone]  [facility]  [address], [place] [address] [phone]  [facility]  [address] [place] [address] [phone]  [facility]  [place] [phone]  [facility]  [address], [place] [address] [phone]  [facility] Patient Placement  [facility], [place] [phone]  [facility]  [address] [place] [address] [phone]  [facility]  [address] [place] [address] [phone]    Situation ongoing,  CSW will follow up.    [name], MSW, LCSWA 05/03/2023 2:17 AM

## 2023-05-03 NOTE — Progress Notes (Signed)
[facility] ED Progress Note  05/03/2023 2:17 PM Justin [name]  MRN:  [id number]   Subjective:  Justin [name], 42 y.o., male patient seen face to face by this provider, consulted with Dr. Lucianne Muss; and chart reviewed on 05/03/23.  On evaluation Justin [name] reports "I want institutionalized".  Patient is loud and angry during assessment and states the hospital needs to send him inpatient.  Patient says he refuses to go to a shelter.  He does not work.  Patient states he steals and eats food to keep busy.     This patient has a significant history of malingering evident by multiple emergency room (ED), urgent care (UC) visits, and psychiatric hospitalizations with similar complaints of suicidal ideation.  Patient has been observed overnight in the ED and UC and there has been no unsafe behavior observed.  Patients' similar complaint of suicidal ideation is often resolved once housing or other need has been arranged for him.  While on unit he demonstrates no unsafe behavior and there is no psychiatric condition which is amendable to inpatient psychiatric hospitalization.  Patients' suicidal ideation is conditional and used as a means of manipulation and/or attention seeking without true intention to harm himself. His behaviors are more consistent with someone who is acting in self-preservation, rather that someone who is acutely suicidal.  Given all that is stated above including the fact that the patient shows no signs of mania or psychosis, has a liner, coherent thought process throughout assessment the patient does not meet criteria for inpatient psychiatric hospitalization or further psychiatric evaluation at this time.  Patient would benefit from outpatient psychiatric services which resources will be provided along with community resources prior to discharge.    Patient's ongoing endorsement of suicidal ideation shows clear evidence of secondary gain of unmet needs that is representative of  limited and often- maladaptive coping skills and rather than an indicator of imminent risk of death.  Evidence indicates that subsequent suicide attempts by patients who made contingent suicide threats (defined as threatened suicide or exaggerated suicidality) are uncommon in both groups.  Hospitalization should not be used as a substitute for social services, substance abuse treatment, and legal assistance for patients who make contingent suicide threats (Characteristics and six-month outcome of patients who use suicide threats to seek hospital admission.  (1966). Psychiatric Services, 47(8), [phone]. (DOI: 10.1176/ps.47.8.871).    Patient has not benefited from past hospitalization under similar circumstances in terms of suicide risk modification, improvement in mental health, or social conditions.  Patients repeated use of ED, UC, and psychiatric hospitalization services instead of recommended outpatient follow-up is ineffective in helping patients' improvement.  Psychiatric hospitalization is not indicated, and the patient's refusal of interventions that have been offered by clinical care teams during prior stays in the ED, UC and during psychiatric hospitalization to mitigate risk of self-harm, other than allowing care team to observe to sobriety or behavior.  This provider and Dr. Lucianne Muss have discussed assessment and treatment recommendations with patient.  Further we see no evidence of severe psychosis, cognitive impairment, intoxication, or other condition that prevents the patient from acting under their own choice and volition.     During evaluation Justin [name] is sitting in bed in no acute distress.  He is alert, oriented x 4, irritated and attentive.  His mood is irritated with congruent affect.  He has loud, clear speech, and his behavior is normal. Objectively there is no evidence of psychosis/mania or delusional thinking.  Patient  is able to converse coherently, goal directed thoughts, no  distractibility, or pre-occupation.  He endorses suicidal/self-harm/homicidal ideation, psychosis, and paranoia.    Patient is psychiatrically cleared.    Principal Problem: Malingering Diagnosis:  Principal Problem:   Malingering Active Problems:   Severe episode of recurrent major depressive disorder, with psychotic features (HCC)   Suicidal thoughts   ED Assessment Time Calculation: Start Time: 1300 Stop Time: 1400 Total Time in Minutes (Assessment Completion): 60   Past Psychiatric History: MDD, anxiety, substance use disorder  [place] Scale:  Flowsheet Row ED from 05/02/2023 in [facility] Emergency Department at [facility] ED from 04/30/2023 in [facility] Emergency Department at [facility] ED from 04/07/2023 in [facility] Emergency Department at [facility]  C-SSRS RISK CATEGORY Error: Question 6 not populated Error: Question 6 not populated High Risk       Past Medical History:  Past Medical History:  Diagnosis Date   Chronic hepatitis C without hepatic coma (HCC) 08/17/2017   Endocarditis    History of syphilis 08/25/2014   Treated by [facility] 07-2014.   HIV (human immunodeficiency virus infection) (HCC)    Seizures (HCC)     Past Surgical History:  Procedure Laterality Date   hand surgery Right    LUMBAR PUNCTURE  08/08/2019       WRIST SURGERY     Family History:  Family History  Adopted: Yes  Problem Relation Age of Onset   Diabetes type II Mother    Family Psychiatric  History: See consult note Social History:  Social History   Substance and Sexual Activity  Alcohol Use Yes   Comment: occ     Social History   Substance and Sexual Activity  Drug Use Yes   Types: Marijuana, Methamphetamines    Social History   Socioeconomic History   Marital status: Single    Spouse name: Not on file   Number of children: Not on file   Years of education: Not on file   Highest education level: Not on file  Occupational History    Not on file  Tobacco Use   Smoking status: Every Day    Types: Cigarettes   Smokeless tobacco: Never  Vaping Use   Vaping status: Never Used  Substance and Sexual Activity   Alcohol use: Yes    Comment: occ   Drug use: Yes    Types: Marijuana, Methamphetamines   Sexual activity: Not Currently    Partners: Male    Birth control/protection: Condom    Comment: given condoms  Other Topics Concern   Not on file  Social History Narrative   ** Merged History Encounter **       ** Merged History Encounter **       ** Merged History Encounter **       ** Merged History Encounter **       Social Determinants of Health   Financial Resource Strain: Not on file  Food Insecurity: Food Insecurity Present (01/07/2023)   Hunger Vital Sign    Worried About Running Out of Food in the Last Year: Sometimes true    Ran Out of Food in the Last Year: Sometimes true  Transportation Needs: Unmet Transportation Needs (01/07/2023)   PRAPARE - Administrator, Civil Service (Medical): Yes    Lack of Transportation (Non-Medical): Yes  Physical Activity: Not on file  Stress: Not on file  Social Connections: Not on file    Sleep: Good  Appetite:  Good  Current Medications: Current Facility-Administered Medications  Medication Dose Route Frequency Provider Last Rate Last Admin   acetaminophen (TYLENOL) tablet 650 mg  650 mg Oral Q4H PRN Loetta Rough, MD       escitalopram (LEXAPRO) tablet 20 mg  20 mg Oral Daily Candis Shine, Sherifat, NP   20 mg at 05/03/23 0936   levETIRAcetam (KEPPRA) tablet 1,000 mg  1,000 mg Oral BID Loetta Rough, MD   1,000 mg at 05/03/23 0936   OLANZapine (ZYPREXA) tablet 10 mg  10 mg Oral QHS Loetta Rough, MD   10 mg at 05/02/23 2145   Current Outpatient Medications  Medication Sig Dispense Refill   acetaminophen (TYLENOL) 500 MG tablet Take 500-1,000 mg by mouth every 6 (six) hours as needed for mild pain (pain score 1-3), moderate pain (pain score 4-6) or  headache.     bictegravir-emtricitabine-tenofovir AF (BIKTARVY) 50-200-25 MG TABS tablet Take 1 tablet by mouth daily. 30 tablet 0   escitalopram (LEXAPRO) 20 MG tablet Take 1 tablet (20 mg total) by mouth daily. 30 tablet 0   levETIRAcetam (KEPPRA) 1000 MG tablet Take 1 tablet (1,000 mg total) by mouth 2 (two) times daily. 60 tablet 0   OLANZapine (ZYPREXA) 10 MG tablet Take 1 tablet (10 mg total) by mouth at bedtime. 30 tablet 0    Lab Results:  Results for orders placed or performed during the hospital encounter of 05/02/23 (from the past 48 hour(s))  CBC with Differential     Status: None   Collection Time: 05/02/23  7:30 PM  Result Value Ref Range   WBC 8.1 4.0 - 10.5 K/uL   RBC 4.82 4.22 - 5.81 MIL/uL   Hemoglobin 15.7 13.0 - 17.0 g/dL   HCT 14.7 82.9 - 56.2 %   MCV 97.1 80.0 - 100.0 fL   MCH 32.6 26.0 - 34.0 pg   MCHC 33.5 30.0 - 36.0 g/dL   RDW 13.0 86.5 - 78.4 %   Platelets 206 150 - 400 K/uL   nRBC 0.0 0.0 - 0.2 %   Neutrophils Relative % 71 %   Neutro Abs 5.7 1.7 - 7.7 K/uL   Lymphocytes Relative 23 %   Lymphs Abs 1.8 0.7 - 4.0 K/uL   Monocytes Relative 6 %   Monocytes Absolute 0.5 0.1 - 1.0 K/uL   Eosinophils Relative 0 %   Eosinophils Absolute 0.0 0.0 - 0.5 K/uL   Basophils Relative 0 %   Basophils Absolute 0.0 0.0 - 0.1 K/uL   Immature Granulocytes 0 %   Abs Immature Granulocytes 0.03 0.00 - 0.07 K/uL    Comment: Performed at [facility], [address] [place], [place] [address]  Comprehensive metabolic panel     Status: Abnormal   Collection Time: 05/02/23  7:30 PM  Result Value Ref Range   Sodium 139 135 - 145 mmol/L   Potassium 3.6 3.5 - 5.1 mmol/L   Chloride 103 98 - 111 mmol/L   CO2 25 22 - 32 mmol/L   Glucose, Bld 112 (H) 70 - 99 mg/dL    Comment: Glucose reference range applies only to samples taken after fasting for at least 8 hours.   BUN 14 6 - 20 mg/dL   Creatinine, Ser 5.28 0.61 - 1.24 mg/dL   Calcium 9.1 8.9 - 41.3 mg/dL    Total Protein 7.4 6.5 - 8.1 g/dL   Albumin 4.2 3.5 - 5.0 g/dL   AST 25 15 - 41  U/L   ALT 27 0 - 44 U/L   Alkaline Phosphatase 68 38 - 126 U/L   Total Bilirubin 1.9 (H) 0.3 - 1.2 mg/dL   GFR, Estimated >16 >10 mL/min    Comment: (NOTE) Calculated using the CKD-EPI Creatinine Equation (2021)    Anion gap 11 5 - 15    Comment: Performed at [facility], [address] [place], [place] [address]  Lipase, blood     Status: None   Collection Time: 05/02/23  7:30 PM  Result Value Ref Range   Lipase 33 11 - 51 U/L    Comment: Performed at [facility], [address], [place], [place] [address]    Blood Alcohol level:  Lab Results  Component Value Date   [facility] <10 04/07/2023   ETH <10 03/29/2023    Physical Findings:  CIWA:    COWS:     Musculoskeletal: Strength & Muscle Tone: within normal limits Gait & Station: normal Patient leans: N/A  Psychiatric Specialty Exam:  Presentation  General Appearance:  Appropriate for Environment  Eye Contact: Fair  Speech: Clear and Coherent  Speech Volume: Increased  Handedness: Right   Mood and Affect  Mood: Irritable  Affect: Congruent   Thought Process  Thought Processes: Goal Directed  Descriptions of Associations:Intact  Orientation:Full (Time, Place and Person)  Thought Content:Logical  History of Schizophrenia/Schizoaffective disorder:No  Duration of Psychotic Symptoms:Less than six months (In context of amphetamine use)  Hallucinations:Hallucinations: Auditory; Visual Description of Auditory Hallucinations: Would not elaborate Description of Visual Hallucinations: Would not elaborate  Ideas of Reference:None  Suicidal Thoughts:Suicidal Thoughts: Yes, Active SI Active Intent and/or Plan: With Intent; With Plan (States he would run into traffic "if needed")  Homicidal Thoughts:Homicidal Thoughts: Yes, Active HI Active Intent and/or Plan: Without Intent;  Without Plan (Would not elaborate)   Sensorium  Memory: Immediate Good; Recent Good; Remote Good  Judgment: Poor  Insight: Fair   Art therapist  Concentration: Good  Attention Span: Good  Recall: Good  Fund of Knowledge: Good  Language: Good   Psychomotor Activity  Psychomotor Activity: Psychomotor Activity: Normal   Assets  Assets: Communication Skills; Leisure Time   Sleep  Sleep: Sleep: Good Number of Hours of Sleep: 5    Physical Exam: Physical Exam Vitals and nursing note reviewed.  Eyes:     Pupils: Pupils are equal, round, and reactive to light.  Pulmonary:     Effort: Pulmonary effort is normal.  Skin:    General: Skin is dry.  Neurological:     Mental Status: He is alert and oriented to person, place, and time.    Review of Systems  Psychiatric/Behavioral:  Positive for substance abuse and suicidal ideas.    Blood pressure 120/81, pulse 92, temperature 98.2 F (36.8 C), temperature source Oral, resp. rate 18, SpO2 99%. There is no height or weight on file to calculate BMI.   Medical Decision Making: Patient case reviewed and discussed with Dr Lucianne Muss. Patients has had more than 25 ED visits this year. Patient's ongoing endorsement of suicidal ideation shows clear evidence of secondary gain of unmet needs that is representative of limited and often- maladaptive coping skills and rather than an indicator of imminent risk of death.  Problem 1: Malingering -Patient is psychiatrically cleared. Resources are added to the AVS   Thomes Lolling, NP 05/03/2023, 2:17 PM

## 2023-05-03 NOTE — ED Notes (Signed)
Patient seemed confused asking where was he at? And then asked to use the phone to make local calls. Rudely refusing his 14:00 vitals. Patient slept the day away only waking to eat.

## 2023-05-03 NOTE — Progress Notes (Signed)
CSW requested [facility] Fransico Michael, RN to review for behavioral health placement. Pt meets inpatient behavioral health placement per Norval Morton, NP [facility]. 1st shift CSW/Disposition team will assist and follow with [facility] placement.    [name], MSW, LCSWA 05/03/2023 2:05 AM

## 2023-05-04 ENCOUNTER — Encounter (HOSPITAL_COMMUNITY): Payer: Self-pay | Admitting: Psychiatry

## 2023-05-04 ENCOUNTER — Other Ambulatory Visit (HOSPITAL_COMMUNITY)
Admission: EM | Admit: 2023-05-04 | Discharge: 2023-05-08 | Disposition: A | Discharge status: Other Acute Inpatient Hospital | Payer: MEDICAID | Attending: Psychiatry | Admitting: Psychiatry

## 2023-05-04 DIAGNOSIS — G40909 Epilepsy, unspecified, not intractable, without status epilepticus: Secondary | ICD-10-CM | POA: Diagnosis not present

## 2023-05-04 DIAGNOSIS — F419 Anxiety disorder, unspecified: Secondary | ICD-10-CM | POA: Insufficient documentation

## 2023-05-04 DIAGNOSIS — Z5900 Homelessness unspecified: Secondary | ICD-10-CM | POA: Insufficient documentation

## 2023-05-04 DIAGNOSIS — F1290 Cannabis use, unspecified, uncomplicated: Secondary | ICD-10-CM

## 2023-05-04 DIAGNOSIS — F17200 Nicotine dependence, unspecified, uncomplicated: Secondary | ICD-10-CM | POA: Insufficient documentation

## 2023-05-04 DIAGNOSIS — F15250 Other stimulant dependence with stimulant-induced psychotic disorder with delusions: Secondary | ICD-10-CM | POA: Insufficient documentation

## 2023-05-04 DIAGNOSIS — R45851 Suicidal ideations: Secondary | ICD-10-CM | POA: Diagnosis not present

## 2023-05-04 DIAGNOSIS — Z21 Asymptomatic human immunodeficiency virus [HIV] infection status: Secondary | ICD-10-CM | POA: Insufficient documentation

## 2023-05-04 DIAGNOSIS — F1220 Cannabis dependence, uncomplicated: Secondary | ICD-10-CM | POA: Diagnosis present

## 2023-05-04 DIAGNOSIS — F15950 Other stimulant use, unspecified with stimulant-induced psychotic disorder with delusions: Secondary | ICD-10-CM

## 2023-05-04 DIAGNOSIS — F1994 Other psychoactive substance use, unspecified with psychoactive substance-induced mood disorder: Secondary | ICD-10-CM

## 2023-05-04 DIAGNOSIS — B20 Human immunodeficiency virus [HIV] disease: Secondary | ICD-10-CM

## 2023-05-04 DIAGNOSIS — F1520 Other stimulant dependence, uncomplicated: Secondary | ICD-10-CM

## 2023-05-04 DIAGNOSIS — F259 Schizoaffective disorder, unspecified: Secondary | ICD-10-CM | POA: Diagnosis not present

## 2023-05-04 DIAGNOSIS — F329 Major depressive disorder, single episode, unspecified: Secondary | ICD-10-CM | POA: Insufficient documentation

## 2023-05-04 DIAGNOSIS — F1594 Other stimulant use, unspecified with stimulant-induced mood disorder: Secondary | ICD-10-CM | POA: Diagnosis present

## 2023-05-04 DIAGNOSIS — F1590 Other stimulant use, unspecified, uncomplicated: Secondary | ICD-10-CM

## 2023-05-04 LAB — LEVETIRACETAM LEVEL: Levetiracetam Lvl: 12.2 ug/mL (ref 10.0–40.0)

## 2023-05-04 LAB — ETHANOL: Alcohol, Ethyl (B): <10 mg/dL (ref <10)

## 2023-05-04 MED ORDER — ESCITALOPRAM OXALATE 10 MG PO TABS
20.0000 mg | ORAL_TABLET | Freq: Every day | ORAL | Status: DC
  Administered 2023-05-04: 20 mg via ORAL
  Filled 2023-05-04: qty 2

## 2023-05-04 MED ORDER — MAGNESIUM HYDROXIDE 400 MG/5ML PO SUSP: 30.0000 mL | Freq: Every day | ORAL | Status: DC | PRN

## 2023-05-04 MED ORDER — HYDROXYZINE HCL 25 MG PO TABS
25.0000 mg | ORAL_TABLET | Freq: Three times a day (TID) | ORAL | Status: DC | PRN
  Administered 2023-05-04: 25 mg via ORAL
  Filled 2023-05-04: qty 1

## 2023-05-04 MED ORDER — LEVETIRACETAM 500 MG PO TABS
1000.0000 mg | ORAL_TABLET | Freq: Two times a day (BID) | ORAL | Status: DC
  Administered 2023-05-04: 1000 mg via ORAL
  Filled 2023-05-04: qty 2

## 2023-05-04 MED ORDER — TRAZODONE HCL 100 MG PO TABS
100.0000 mg | ORAL_TABLET | Freq: Once | ORAL | Status: AC
  Administered 2023-05-04: 100 mg via ORAL
  Filled 2023-05-04: qty 1

## 2023-05-04 MED ORDER — OLANZAPINE 10 MG PO TABS: 10.0000 mg | ORAL_TABLET | Freq: Every day | ORAL | Status: DC

## 2023-05-04 MED ORDER — NICOTINE 14 MG/24HR TD PT24
14.0000 mg | MEDICATED_PATCH | Freq: Every day | TRANSDERMAL | Status: DC
  Administered 2023-05-05 – 2023-05-08 (×4): 14 mg via TRANSDERMAL
  Filled 2023-05-04 (×4): qty 1

## 2023-05-04 MED ORDER — BICTEGRAVIR-EMTRICITAB-TENOFOV 50-200-25 MG PO TABS
1.0000 | ORAL_TABLET | Freq: Every day | ORAL | Status: DC
  Administered 2023-05-04: 1 via ORAL
  Filled 2023-05-04: qty 1

## 2023-05-04 MED ORDER — ALUM & MAG HYDROXIDE-SIMETH 200-200-20 MG/5ML PO SUSP: 30.0000 mL | ORAL | Status: DC | PRN

## 2023-05-04 MED ORDER — ESCITALOPRAM OXALATE 10 MG PO TABS
20.0000 mg | ORAL_TABLET | Freq: Every day | ORAL | Status: DC
  Administered 2023-05-05 – 2023-05-08 (×4): 20 mg via ORAL
  Filled 2023-05-04: qty 2
  Filled 2023-05-04: qty 14
  Filled 2023-05-04 (×3): qty 2

## 2023-05-04 MED ORDER — BICTEGRAVIR-EMTRICITAB-TENOFOV 50-200-25 MG PO TABS
1.0000 | ORAL_TABLET | Freq: Every day | ORAL | Status: DC
  Administered 2023-05-05 – 2023-05-08 (×4): 1 via ORAL
  Filled 2023-05-04 (×5): qty 1

## 2023-05-04 MED ORDER — OLANZAPINE 10 MG PO TABS
10.0000 mg | ORAL_TABLET | Freq: Every day | ORAL | Status: DC
  Administered 2023-05-04: 10 mg via ORAL
  Filled 2023-05-04: qty 1

## 2023-05-04 MED ORDER — ACETAMINOPHEN 325 MG PO TABS
650.0000 mg | ORAL_TABLET | Freq: Four times a day (QID) | ORAL | Status: DC | PRN
  Administered 2023-05-04: 650 mg via ORAL
  Filled 2023-05-04: qty 2

## 2023-05-04 MED ORDER — TRAZODONE HCL 50 MG PO TABS
50.0000 mg | ORAL_TABLET | Freq: Every evening | ORAL | Status: DC | PRN
Start: 2023-05-04 — End: 2023-05-08
  Administered 2023-05-04 – 2023-05-07 (×4): 50 mg via ORAL
  Filled 2023-05-04: qty 7
  Filled 2023-05-04 (×3): qty 1
  Filled 2023-05-04: qty 7
  Filled 2023-05-04: qty 1

## 2023-05-04 MED ORDER — LEVETIRACETAM 500 MG PO TABS
1000.0000 mg | ORAL_TABLET | Freq: Two times a day (BID) | ORAL | Status: DC
Start: 2023-05-04 — End: 2023-05-08
  Administered 2023-05-04 – 2023-05-08 (×8): 1000 mg via ORAL
  Filled 2023-05-04 (×8): qty 2

## 2023-05-04 NOTE — ED Notes (Signed)
Pt is currently sleeping, no distress noted, environmental check complete, will continue to monitor patient for safety.  

## 2023-05-04 NOTE — ED Notes (Addendum)
Pt here voluntarily for suicidal ideations and AVH. Pt is passive SI without a plan and contracts for safety. Pt denies HI. Endoses AVH as command AH that tell him he is worthless, tell him to kill himself and says the voices will show him how to do it. Pt states that he takes a shower to distract himself when he hears the voices. Endorses poor sleep d/t the voices. "They are driving me mad. They tell me I'm worthless and should give up and kill myself. I'm homeless, always in need of food and no family support. They make me feel like I should just end it all." Pt states that he was at a [facility] and was taking ZZQuil at night to sleep because of the racing thoughts. "I was getting up at 6 am to go to work and my mind would race at night so I couldn't go to sleep. I didn't know that the ZZQuil would give a false positive on a drug test. It was also helping with my nasal stuffiness. I was working and had a place to stay. Now I don't have anything." Pt wants stable housing and a job. The job he had was through [employer] and he lost that when he was asked to leave the House. He endorses high anxiety and depression d/t recent events. He is from [place] but has no support from his family. Pt also endorses VH of seeing visions of someone with many faces that says they are "Time itself." Pt is cooperative and says he wants help. [place] Spaniel he was taking his Keppra and Zyprexa regularly and the voices were "managed." He stopped taking it when he was put out of the [facility] house. Back on the meds now. Encouraged pt to let providers know if his AVH is being helped by current dosage of Zyprexa or if adjustments need to be made. Pt verbalized understanding.

## 2023-05-04 NOTE — ED Notes (Signed)
Patient adjusting well to the unit.  He ate dinner and is now taking a shower.  He is calm and pleasant on approach.  Patient denies avh shi or plan.

## 2023-05-04 NOTE — ED Notes (Signed)
Patient resting with eyes closed. Respirations even and unlabored. No acute distress noted. Environment secured. Will continue to monitor for safety.

## 2023-05-04 NOTE — Group Note (Signed)
Group Topic: Recovery Basics  Group Date: 05/04/2023 Start Time: 2000 End Time: 2100 Facilitators: Quinn Axe, NT  Department: [facility]  Number of Participants: 4  Group Focus: check in, clarity of thought, communication, coping skills, and substance abuse education Treatment Modality:  Cognitive Behavioral Therapy and Spiritual Interventions utilized were clarification, confrontation, and story telling Purpose: enhance coping skills, express feelings, and increase insight  Name: Justin [name] Date of Birth: 19-May-1981  MR: [id number]    Level of Participation: Patient did not attend meeting Quality of Participation: n/a Interactions with others: N/A Mood/Affect: N/A Triggers (if applicable): N/A Cognition: N/A Progress: Other Response: N/A Plan: patient will be encouraged to attend regular scheduled meetings and activities on the unit.  Patients Problems:  Patient Active Problem List   Diagnosis Date Noted   MDD (major depressive disorder) 05/04/2023   Severe episode of recurrent major depressive disorder, with psychotic features (HCC) 05/03/2023   Suicidal thoughts 05/03/2023   Homicidal ideation 04/07/2023   Cannabis abuse 04/07/2023   Suicidal ideation 04/07/2023   Homelessness 03/30/2023   Cannabis use disorder, severe, dependence (HCC) 03/30/2023   Malingering 03/30/2023   Housing instability due to imminent risk of homelessness 03/03/2023   URI with cough and congestion 03/03/2023   MDD (major depressive disorder), recurrent severe, without psychosis (HCC) 01/18/2023   MDD (major depressive disorder), recurrent, severe, 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/06/2023   Seizure (HCC) 12/24/2021   Seizure disorder (HCC) 10/28/2021   Seizures (HCC) 07/31/2021   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   Suicidal ideations 07/08/2020   Substance use disorder 08/07/2019   Chronic hepatitis C without hepatic coma (HCC) 08/17/2017    Screening examination for venereal disease 05/03/2017   Encounter for long-term (current) use of high-risk medication 05/03/2017   Anxiety 09/26/2016   Human immunodeficiency virus (HIV) disease (HCC) 09/02/2014

## 2023-05-04 NOTE — ED Notes (Signed)
Pt resting at this hour. No apparent distress. RR even and unlabored. Monitored for safety.  

## 2023-05-04 NOTE — Progress Notes (Signed)
Patient agreeable to taking his PRN Zyprexa 10 mg PO this morning reporting that he normally takes Zyprexa but did not receive it last night and has not had in a couple of days.  Patient polite, calm and cooperative with care this am.  He reports that he has been making progress in trying to stay away from drugs.  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  He reports that he hasn't seen his brother in a while and thinks he is in prison.  Patient verbalized understanding of unit rules and policies.  This is the first time this writer has encountered "[place]" and he has NOT been under the influence of illicit drugs/substances.

## 2023-05-04 NOTE — ED Notes (Signed)
Patient admitted to [facility] from [facility] for assistance with recovery from substances.  Patient states he relapsed after 90 clean on his own.  He smoked marijuana.  Patient states he has not had a drink or smoked meth since August.  He is calm and cooperative with care and admission process.  Oriented to unit and shown to his room.

## 2023-05-04 NOTE — ED Provider Notes (Addendum)
FBC/OBS ASAP Discharge Summary  Date and Time: 05/04/2023 3:40 PM  Name: Justin [name]  MRN:  [id number]   Discharge Diagnoses:  Final diagnoses:  Schizoaffective disorder, unspecified type (HCC)  Suicidal ideation  Aggression  Homelessness unspecified   Subjective:  Pt chart reviewed and seen on rounds. Notable for multiple recent hospital visits, most recently discharged from [facility] on 05/03/23. Pt somnolent on my assessment. Per RN and chart review, pt received zyprexa 10mg  oral earlier this morning at 0653 after reporting he normally takes it but did not receive it last night and ativan 2mg  PO earlier this morning at 0118. Pt falling asleep on assessment, although does deny suicidal, homicidal ideations as well as auditory visual hallucinations or paranoia.  On re-assessment later in the day, endorses ongoing depression, anxiety, and intermittent passive suicidal ideations. Discussed transfer to [facility] and he was in agreement.  Stay Summary: 42 y/o male w/ history of MDD, Anxiety, Substance induced mood disorder, Malingering, Polysubstance abuse admitted to [facility] endorsing SI with plan to drink benadryl, notable for multiple recent admissions. On re-assessment later in the day, endorses ongoing depression, anxiety, and intermittent passive suicidal ideations. Transfer to [facility] for further stabilization.  Total Time spent with patient:  25 minutes  Past Psychiatric History: MDD, Anxiety, Substance induced mood disorder, Malingering, Polysubstance abuse Past Medical History: HIV Family History: None reported Family Psychiatric History: None reported Social History: Homeless Tobacco Cessation:  Prescription not provided because: transfer to [facility]  Current Medications:  No current facility-administered medications for this encounter.   Current Outpatient Medications  Medication Sig Dispense Refill   acetaminophen (TYLENOL) 500 MG tablet Take 500-1,000 mg by mouth every 6 (six) hours as  needed for mild pain (pain score 1-3), moderate pain (pain score 4-6) or headache.     bictegravir-emtricitabine-tenofovir AF (BIKTARVY) 50-200-25 MG TABS tablet Take 1 tablet by mouth daily. 30 tablet 0   escitalopram (LEXAPRO) 20 MG tablet Take 1 tablet (20 mg total) by mouth daily. 30 tablet 0   levETIRAcetam (KEPPRA) 1000 MG tablet Take 1 tablet (1,000 mg total) by mouth 2 (two) times daily. 60 tablet 0   OLANZapine (ZYPREXA) 10 MG tablet Take 1 tablet (10 mg total) by mouth at bedtime. 30 tablet 0    PTA Medications:  Facility Ordered Medications  Medication   [COMPLETED] LORazepam (ATIVAN) 2 MG/ML injection   [COMPLETED] traZODone (DESYREL) tablet 100 mg   PTA Medications  Medication Sig   acetaminophen (TYLENOL) 500 MG tablet Take 500-1,000 mg by mouth every 6 (six) hours as needed for mild pain (pain score 1-3), moderate pain (pain score 4-6) or headache.   levETIRAcetam (KEPPRA) 1000 MG tablet Take 1 tablet (1,000 mg total) by mouth 2 (two) times daily.   bictegravir-emtricitabine-tenofovir AF (BIKTARVY) 50-200-25 MG TABS tablet Take 1 tablet by mouth daily.   escitalopram (LEXAPRO) 20 MG tablet Take 1 tablet (20 mg total) by mouth daily.   OLANZapine (ZYPREXA) 10 MG tablet Take 1 tablet (10 mg total) by mouth at bedtime.       05/04/2023    3:39 PM 04/25/2023    3:11 PM 08/17/2022    3:38 PM  Depression screen PHQ 2/9  Decreased Interest 1 0 0  Down, Depressed, Hopeless 3 1 1   PHQ - 2 Score 4 1 1   Altered sleeping 3    Tired, decreased energy 3    Change in appetite 3    Feeling bad or failure about yourself  3  Trouble concentrating 3    Moving slowly or fidgety/restless 2    Suicidal thoughts 2    PHQ-9 Score 23    Difficult doing work/chores Extremely dIfficult      Flowsheet Row ED from 05/03/2023 in [facility] ED from 05/02/2023 in [facility] Emergency Department at [facility] ED from 04/30/2023 in [facility]  Emergency Department at [facility]  C-SSRS RISK CATEGORY High Risk Error: Question 6 not populated Error: Question 6 not populated       Musculoskeletal  Strength & Muscle Tone: within normal limits Gait & Station: normal Patient leans: N/A  Psychiatric Specialty Exam  Presentation  General Appearance:  Disheveled  Eye Contact: Minimal  Speech: Slow; Slurred  Speech Volume: Decreased  Handedness: Right   Mood and Affect  Mood: -- ("ok")  Affect: Flat   Thought Process  Thought Processes: Coherent  Descriptions of Associations:Circumstantial  Orientation:Full (Time, Place and Person)  Thought Content:WDL  Diagnosis of Schizophrenia or Schizoaffective disorder in past: No    Hallucinations:Hallucinations: None  Ideas of Reference:None  Suicidal Thoughts:Suicidal Thoughts: No  Homicidal Thoughts:Homicidal Thoughts: No  Sensorium  Memory: Immediate Fair  Judgment: Intact  Insight: Present   Executive Functions  Concentration: Poor  Attention Span: Poor  Recall: Poor  Fund of Knowledge: Fair  Language: Fair   Psychomotor Activity  Psychomotor Activity: Psychomotor Activity: Decreased   Assets  Assets: Desire for Improvement   Sleep  Sleep: Sleep: Good Number of Hours of Sleep: 5   Nutritional Assessment (For OBS and FBC admissions only) Has the patient had a weight loss or gain of 10 pounds or more in the last 3 months?: No Has the patient had a decrease in food intake/or appetite?: No Does the patient have dental problems?: No Does the patient have eating habits or behaviors that may be indicators of an eating disorder including binging or inducing vomiting?: No Has the patient recently lost weight without trying?: 0 Has the patient been eating poorly because of a decreased appetite?: 0 Malnutrition Screening Tool Score: 0    Physical Exam  Physical Exam Constitutional:      General: He is not in  acute distress.    Appearance: He is not ill-appearing, toxic-appearing or diaphoretic.  Eyes:     General: No scleral icterus. Cardiovascular:     Rate and Rhythm: Normal rate.  Pulmonary:     Effort: Pulmonary effort is normal. No respiratory distress.  Neurological:     Mental Status: He is alert and oriented to person, place, and time.    Review of Systems  Constitutional:  Negative for chills and fever.  Respiratory:  Negative for shortness of breath.   Cardiovascular:  Negative for chest pain and palpitations.  Gastrointestinal:  Negative for abdominal pain.  Neurological:  Negative for headaches.   Blood pressure 112/81, pulse 96, temperature 98.3 F (36.8 C), temperature source Oral, resp. rate 18, SpO2 100%. There is no height or weight on file to calculate BMI.  Demographic Factors:  Male, Low socioeconomic status, Living alone, and Unemployed  Loss Factors: Financial problems/change in socioeconomic status  Historical Factors: Impulsivity  Risk Reduction Factors:   Positive social support  Continued Clinical Symptoms:  Depression Previous Psychiatric Diagnoses and Treatments  Cognitive Features That Contribute To Risk:  None    Suicide Risk:  Mild:  Suicidal ideation of limited frequency, intensity, duration, and specificity.  There are no identifiable plans, no associated intent, mild  dysphoria and related symptoms, good self-control (both objective and subjective assessment), few other risk factors, and identifiable protective factors, including available and accessible social support.  Plan Of Care/Follow-up recommendations:  Transfer to [facility]  Disposition: Transfer to [facility]  Lauree Chandler, NP 05/04/2023, 3:40 PM

## 2023-05-04 NOTE — ED Provider Notes (Addendum)
Behavioral Health Progress Note  Date and Time: 05/04/2023 9:36 AM Name: Justin [name] MRN:  [id number]  Subjective:   Pt chart reviewed and seen on rounds. Notable for multiple recent hospital visits, most recently discharged from [facility] on 05/03/23. Pt somnolent on my assessment. Per RN and chart review, pt received zyprexa 10mg  oral earlier this morning at 0653 after reporting he normally takes it but did not receive it last night and ativan 2mg  PO earlier this morning at 0118. Pt falling asleep on assessment, although does deny suicidal, homicidal ideations as well as auditory visual hallucinations or paranoia.  Diagnosis:  Final diagnoses:  Schizoaffective disorder, unspecified type (HCC)  Suicidal ideation  Aggression  Homelessness unspecified    Total Time spent with patient, including time spent reviewing pt's charts:  25 minutes  Past Psychiatric History: MDD, Anxiety, Substance induced mood disorder, Malingering, Polysubstance abuse Past Medical History: HIV Family History: None reported Family Psychiatric  History: None reported Social History: Homeless    Pain Medications: See MAR Prescriptions: See MAR Over the Counter: See MAR History of alcohol / drug use?: Yes Longest period of sobriety (when/how long): Unknown Negative Consequences of Use: Personal relationships, Work / Programmer, multimedia, Surveyor, quantity, Armed forces operational officer Withdrawal Symptoms: None                    Sleep: Good  Appetite:  Good  Current Medications:  Current Facility-Administered Medications  Medication Dose Route Frequency Provider Last Rate Last Admin   acetaminophen (TYLENOL) tablet 650 mg  650 mg Oral Q6H PRN Sindy Guadeloupe, NP       alum & mag hydroxide-simeth (MAALOX/MYLANTA) 200-200-20 MG/5ML suspension 30 mL  30 mL Oral Q4H PRN Sindy Guadeloupe, NP       bictegravir-emtricitabine-tenofovir AF (BIKTARVY) 50-200-25 MG per tablet 1 tablet  1 tablet Oral Daily Sindy Guadeloupe, NP   1 tablet at 05/04/23 0935    escitalopram (LEXAPRO) tablet 20 mg  20 mg Oral Daily Sindy Guadeloupe, NP   20 mg at 05/04/23 0935   levETIRAcetam (KEPPRA) tablet 1,000 mg  1,000 mg Oral BID Sindy Guadeloupe, NP   1,000 mg at 05/04/23 4166   LORazepam (ATIVAN) tablet 2 mg  2 mg Oral Q6H PRN Sindy Guadeloupe, NP   2 mg at 05/04/23 0118   Or   LORazepam (ATIVAN) tablet 2 mg  2 mg Oral Q6H PRN Sindy Guadeloupe, NP       magnesium hydroxide (MILK OF MAGNESIA) suspension 30 mL  30 mL Oral Daily PRN Sindy Guadeloupe, NP       OLANZapine (ZYPREXA) tablet 10 mg  10 mg Oral QHS Sindy Guadeloupe, NP       OLANZapine zydis (ZYPREXA) disintegrating tablet 10 mg  10 mg Oral Q8H PRN Sindy Guadeloupe, NP   10 mg at 05/04/23 0630   And   ziprasidone (GEODON) injection 20 mg  20 mg Intramuscular PRN Sindy Guadeloupe, NP       Current Outpatient Medications  Medication Sig Dispense Refill   acetaminophen (TYLENOL) 500 MG tablet Take 500-1,000 mg by mouth every 6 (six) hours as needed for mild pain (pain score 1-3), moderate pain (pain score 4-6) or headache.     bictegravir-emtricitabine-tenofovir AF (BIKTARVY) 50-200-25 MG TABS tablet Take 1 tablet by mouth daily. 30 tablet 0   escitalopram (LEXAPRO) 20 MG tablet Take 1 tablet (20 mg total) by mouth daily. 30 tablet 0   levETIRAcetam (KEPPRA) 1000 MG tablet Take 1 tablet (1,000 mg total) by mouth  2 (two) times daily. 60 tablet 0   OLANZapine (ZYPREXA) 10 MG tablet Take 1 tablet (10 mg total) by mouth at bedtime. 30 tablet 0    Labs  Lab Results:  Admission on 05/03/2023  Component Date Value Ref Range Status   Alcohol, Ethyl (B) 05/04/2023 <10  <10 mg/dL Final   Comment: (NOTE) Lowest detectable limit for serum alcohol is 10 mg/dL.  For medical purposes only. Performed at [facility], [address] [place], [place] [address]   Admission on 05/02/2023, Discharged on 05/03/2023  Component Date Value Ref Range Status   WBC 05/02/2023 8.1  4.0 - 10.5 K/uL Final   RBC 05/02/2023 4.82  4.22 - 5.81  MIL/uL Final   Hemoglobin 05/02/2023 15.7  13.0 - 17.0 g/dL Final   HCT 19/14/7829 46.8  39.0 - 52.0 % Final   MCV 05/02/2023 97.1  80.0 - 100.0 fL Final   MCH 05/02/2023 32.6  26.0 - 34.0 pg Final   MCHC 05/02/2023 33.5  30.0 - 36.0 g/dL Final   RDW 56/21/3086 11.9  11.5 - 15.5 % Final   Platelets 05/02/2023 206  150 - 400 K/uL Final   nRBC 05/02/2023 0.0  0.0 - 0.2 % Final   Neutrophils Relative % 05/02/2023 71  % Final   Neutro Abs 05/02/2023 5.7  1.7 - 7.7 K/uL Final   Lymphocytes Relative 05/02/2023 23  % Final   Lymphs Abs 05/02/2023 1.8  0.7 - 4.0 K/uL Final   Monocytes Relative 05/02/2023 6  % Final   Monocytes Absolute 05/02/2023 0.5  0.1 - 1.0 K/uL Final   Eosinophils Relative 05/02/2023 0  % Final   Eosinophils Absolute 05/02/2023 0.0  0.0 - 0.5 K/uL Final   Basophils Relative 05/02/2023 0  % Final   Basophils Absolute 05/02/2023 0.0  0.0 - 0.1 K/uL Final   Immature Granulocytes 05/02/2023 0  % Final   Abs Immature Granulocytes 05/02/2023 0.03  0.00 - 0.07 K/uL Final   Performed at [facility], [address] [place], [place] [address]   Sodium 05/02/2023 139  135 - 145 mmol/L Final   Potassium 05/02/2023 3.6  3.5 - 5.1 mmol/L Final   Chloride 05/02/2023 103  98 - 111 mmol/L Final   CO2 05/02/2023 25  22 - 32 mmol/L Final   Glucose, Bld 05/02/2023 112 (H)  70 - 99 mg/dL Final   Glucose reference range applies only to samples taken after fasting for at least 8 hours.   BUN 05/02/2023 14  6 - 20 mg/dL Final   Creatinine, Ser 05/02/2023 0.95  0.61 - 1.24 mg/dL Final   Calcium 96/29/5284 9.1  8.9 - 10.3 mg/dL Final   Total Protein 13/24/4010 7.4  6.5 - 8.1 g/dL Final   Albumin 27/25/3664 4.2  3.5 - 5.0 g/dL Final   AST 40/34/7425 25  15 - 41 U/L Final   ALT 05/02/2023 27  0 - 44 U/L Final   Alkaline Phosphatase 05/02/2023 68  38 - 126 U/L Final   Total Bilirubin 05/02/2023 1.9 (H)  0.3 - 1.2 mg/dL Final   GFR, Estimated 05/02/2023 >60  >60 mL/min Final    Comment: (NOTE) Calculated using the CKD-EPI Creatinine Equation (2021)    Anion gap 05/02/2023 11  5 - 15 Final   Performed at [facility], [address], [place], [place] [address]   Lipase 05/02/2023 33  11 - 51 U/L Final   Performed at [facility], [address], [place], [place] [address]  Admission on 04/30/2023, Discharged on 05/02/2023  Component Date Value Ref Range Status   Sodium 04/30/2023 137  135 - 145 mmol/L Final   Potassium 04/30/2023 3.5  3.5 - 5.1 mmol/L Final   Chloride 04/30/2023 101  98 - 111 mmol/L Final   CO2 04/30/2023 20 (L)  22 - 32 mmol/L Final   Glucose, Bld 04/30/2023 88  70 - 99 mg/dL Final   Glucose reference range applies only to samples taken after fasting for at least 8 hours.   BUN 04/30/2023 26 (H)  6 - 20 mg/dL Final   Creatinine, Ser 04/30/2023 1.21  0.61 - 1.24 mg/dL Final   Calcium 44/09/4740 9.3  8.9 - 10.3 mg/dL Final   Total Protein 59/56/3875 7.8  6.5 - 8.1 g/dL Final   Albumin 64/33/2951 4.6  3.5 - 5.0 g/dL Final   AST 88/41/6606 53 (H)  15 - 41 U/L Final   ALT 04/30/2023 37  0 - 44 U/L Final   Alkaline Phosphatase 04/30/2023 81  38 - 126 U/L Final   Total Bilirubin 04/30/2023 2.8 (H)  0.3 - 1.2 mg/dL Final   GFR, Estimated 04/30/2023 >60  >60 mL/min Final   Comment: (NOTE) Calculated using the CKD-EPI Creatinine Equation (2021)    Anion gap 04/30/2023 16 (H)  5 - 15 Final   Performed at [facility], [address], [place], [place] [address]   WBC 04/30/2023 10.2  4.0 - 10.5 K/uL Final   RBC 04/30/2023 4.92  4.22 - 5.81 MIL/uL Final   Hemoglobin 04/30/2023 15.9  13.0 - 17.0 g/dL Final   HCT 10/93/2355 47.6  39.0 - 52.0 % Final   MCV 04/30/2023 96.7  80.0 - 100.0 fL Final   MCH 04/30/2023 32.3  26.0 - 34.0 pg Final   MCHC 04/30/2023 33.4  30.0 - 36.0 g/dL Final   RDW 73/22/0254 12.1  11.5 - 15.5 % Final   Platelets 04/30/2023 218  150 - 400 K/uL Final   nRBC 04/30/2023  0.0  0.0 - 0.2 % Final   Neutrophils Relative % 04/30/2023 64  % Final   Neutro Abs 04/30/2023 6.5  1.7 - 7.7 K/uL Final   Lymphocytes Relative 04/30/2023 25  % Final   Lymphs Abs 04/30/2023 2.5  0.7 - 4.0 K/uL Final   Monocytes Relative 04/30/2023 11  % Final   Monocytes Absolute 04/30/2023 1.1 (H)  0.1 - 1.0 K/uL Final   Eosinophils Relative 04/30/2023 0  % Final   Eosinophils Absolute 04/30/2023 0.0  0.0 - 0.5 K/uL Final   Basophils Relative 04/30/2023 0  % Final   Basophils Absolute 04/30/2023 0.0  0.0 - 0.1 K/uL Final   Immature Granulocytes 04/30/2023 0  % Final   Abs Immature Granulocytes 04/30/2023 0.03  0.00 - 0.07 K/uL Final   Performed at [facility], [address] [place], [place] [address]   Opiates 04/30/2023 NONE DETECTED  NONE DETECTED Final   Cocaine 04/30/2023 NONE DETECTED  NONE DETECTED Final   Benzodiazepines 04/30/2023 NONE DETECTED  NONE DETECTED Final   Amphetamines 04/30/2023 POSITIVE (A)  NONE DETECTED Final   Tetrahydrocannabinol 04/30/2023 NONE DETECTED  NONE DETECTED Final   Barbiturates 04/30/2023 NONE DETECTED  NONE DETECTED Final   Comment: (NOTE) DRUG SCREEN FOR MEDICAL PURPOSES ONLY.  IF CONFIRMATION IS NEEDED FOR ANY PURPOSE, NOTIFY LAB WITHIN 5 DAYS.  LOWEST DETECTABLE LIMITS FOR URINE DRUG SCREEN Drug Class  Cutoff (ng/mL) Amphetamine and metabolites    1000 Barbiturate and metabolites    200 Benzodiazepine                 200 Opiates and metabolites        300 Cocaine and metabolites        300 THC                            50 Performed at [facility], [address] [place], [place] [address]    Color, Urine 04/30/2023 YELLOW  YELLOW Final   APPearance 04/30/2023 CLEAR  CLEAR Final   Specific Gravity, Urine 04/30/2023 1.027  1.005 - 1.030 Final   pH 04/30/2023 5.0  5.0 - 8.0 Final   Glucose, UA 04/30/2023 NEGATIVE  NEGATIVE mg/dL Final   Hgb urine dipstick 04/30/2023  NEGATIVE  NEGATIVE Final   Bilirubin Urine 04/30/2023 NEGATIVE  NEGATIVE Final   Ketones, ur 04/30/2023 20 (A)  NEGATIVE mg/dL Final   Protein, ur 07/12/3233 NEGATIVE  NEGATIVE mg/dL Final   Nitrite 57/32/2025 NEGATIVE  NEGATIVE Final   Leukocytes,Ua 04/30/2023 NEGATIVE  NEGATIVE Final   RBC / HPF 04/30/2023 0-5  0 - 5 RBC/hpf Final   WBC, UA 04/30/2023 0-5  0 - 5 WBC/hpf Final   Bacteria, UA 04/30/2023 RARE (A)  NONE SEEN Final   Squamous Epithelial / HPF 04/30/2023 0-5  0 - 5 /HPF Final   Mucus 04/30/2023 PRESENT   Final   Performed at [facility], [address] [place], [place] [address]  Office Visit on 04/25/2023  Component Date Value Ref Range Status   HIV 1 RNA Quant 04/25/2023 Not Detected  Copies/mL Final   HIV-1 RNA Quant, Log 04/25/2023 Not Detected  Log cps/mL Final   Comment: . Reference Range:                           Not Detected     copies/mL                           Not Detected Log copies/mL . [name] The test was performed using Real-Time Polymerase Chain Reaction. . . Reportable Range: 20 copies/mL to 10,000,000 copies/mL (1.30 Log copies/mL to 7.00 Log copies/mL). .    CD4 T Cell Abs 04/25/2023 537  400 - 1,790 /uL Final   CD4 % Helper T Cell 04/25/2023 31 (L)  33 - 65 % Final   Performed at [facility], [address] [place], [place] [address]   RPR Ser Ql 04/25/2023 NON-REACTIVE  NON-REACTIVE Final   Comment: . No laboratory evidence of syphilis. If recent exposure is suspected, submit a new sample in 2-4 weeks. .   Admission on 04/07/2023, Discharged on 04/08/2023  Component Date Value Ref Range Status   Glucose-Capillary 04/07/2023 126 (H)  70 - 99 mg/dL Final   Glucose reference range applies only to samples taken after fasting for at least 8 hours.   Sodium 04/07/2023 142  135 - 145 mmol/L Final   Potassium 04/07/2023 3.9  3.5 - 5.1 mmol/L Final   Chloride 04/07/2023 104  98 - 111 mmol/L Final   CO2  04/07/2023 26  22 - 32 mmol/L Final   Glucose, Bld 04/07/2023 92  70 - 99 mg/dL Final   Glucose reference range applies only to samples  taken after fasting for at least 8 hours.   BUN 04/07/2023 10  6 - 20 mg/dL Final   Creatinine, Ser 04/07/2023 0.97  0.61 - 1.24 mg/dL Final   Calcium 29/56/2130 9.6  8.9 - 10.3 mg/dL Final   Total Protein 86/57/8469 8.0  6.5 - 8.1 g/dL Final   Albumin 62/95/2841 4.4  3.5 - 5.0 g/dL Final   AST 32/44/0102 25  15 - 41 U/L Final   ALT 04/07/2023 33  0 - 44 U/L Final   Alkaline Phosphatase 04/07/2023 85  38 - 126 U/L Final   Total Bilirubin 04/07/2023 1.9 (H)  0.3 - 1.2 mg/dL Final   GFR, Estimated 04/07/2023 >60  >60 mL/min Final   Comment: (NOTE) Calculated using the CKD-EPI Creatinine Equation (2021)    Anion gap 04/07/2023 12  5 - 15 Final   Performed at [facility], [address], [place], [place] [address]   Alcohol, Ethyl (B) 04/07/2023 <10  <10 mg/dL Final   Comment: (NOTE) Lowest detectable limit for serum alcohol is 10 mg/dL.  For medical purposes only. Performed at [facility], [address], [place], [place] [address]    Salicylate Lvl 04/07/2023 <7.0 (L)  7.0 - 30.0 mg/dL Final   Performed at [facility], [address] [place], [place] [address]   Acetaminophen (Tylenol), Serum 04/07/2023 <10 (L)  10 - 30 ug/mL Final   Comment: (NOTE) Therapeutic concentrations vary significantly. A range of 10-30 ug/mL  may be an effective concentration for many patients. However, some  are best treated at concentrations outside of this range. Acetaminophen concentrations >150 ug/mL at 4 hours after ingestion  and >50 ug/mL at 12 hours after ingestion are often associated with  toxic reactions.  Performed at [facility], [address] [place], [place] [address]    WBC 04/07/2023 8.7  4.0 - 10.5 K/uL Final   RBC 04/07/2023 5.01  4.22 - 5.81 MIL/uL Final    Hemoglobin 04/07/2023 16.5  13.0 - 17.0 g/dL Final   HCT 75/64/3329 50.2  39.0 - 52.0 % Final   MCV 04/07/2023 100.2 (H)  80.0 - 100.0 fL Final   MCH 04/07/2023 32.9  26.0 - 34.0 pg Final   MCHC 04/07/2023 32.9  30.0 - 36.0 g/dL Final   RDW 51/88/4166 12.3  11.5 - 15.5 % Final   Platelets 04/07/2023 229  150 - 400 K/uL Final   nRBC 04/07/2023 0.0  0.0 - 0.2 % Final   Performed at [facility], [address], [place], [place] [address]   Opiates 04/07/2023 NONE DETECTED  NONE DETECTED Final   Cocaine 04/07/2023 NONE DETECTED  NONE DETECTED Final   Benzodiazepines 04/07/2023 NONE DETECTED  NONE DETECTED Final   Amphetamines 04/07/2023 NONE DETECTED  NONE DETECTED Final   Tetrahydrocannabinol 04/07/2023 POSITIVE (A)  NONE DETECTED Final   Barbiturates 04/07/2023 NONE DETECTED  NONE DETECTED Final   Comment: (NOTE) DRUG SCREEN FOR MEDICAL PURPOSES ONLY.  IF CONFIRMATION IS NEEDED FOR ANY PURPOSE, NOTIFY LAB WITHIN 5 DAYS.  LOWEST DETECTABLE LIMITS FOR URINE DRUG SCREEN Drug Class                     Cutoff (ng/mL) Amphetamine and metabolites    1000 Barbiturate and metabolites    200 Benzodiazepine                 200 Opiates and metabolites  300 Cocaine and metabolites        300 THC                            50 Performed at [facility], [address], [place], [place] [address]   Admission on 04/01/2023, Discharged on 04/02/2023  Component Date Value Ref Range Status   WBC 04/01/2023 7.2  4.0 - 10.5 K/uL Final   RBC 04/01/2023 4.80  4.22 - 5.81 MIL/uL Final   Hemoglobin 04/01/2023 15.2  13.0 - 17.0 g/dL Final   HCT 69/62/9528 47.4  39.0 - 52.0 % Final   MCV 04/01/2023 98.8  80.0 - 100.0 fL Final   MCH 04/01/2023 31.7  26.0 - 34.0 pg Final   MCHC 04/01/2023 32.1  30.0 - 36.0 g/dL Final   RDW 41/32/4401 11.9  11.5 - 15.5 % Final   Platelets 04/01/2023 220  150 - 400 K/uL Final   nRBC 04/01/2023 0.0  0.0 - 0.2 % Final    Neutrophils Relative % 04/01/2023 42  % Final   Neutro Abs 04/01/2023 3.0  1.7 - 7.7 K/uL Final   Lymphocytes Relative 04/01/2023 46  % Final   Lymphs Abs 04/01/2023 3.3  0.7 - 4.0 K/uL Final   Monocytes Relative 04/01/2023 9  % Final   Monocytes Absolute 04/01/2023 0.6  0.1 - 1.0 K/uL Final   Eosinophils Relative 04/01/2023 2  % Final   Eosinophils Absolute 04/01/2023 0.1  0.0 - 0.5 K/uL Final   Basophils Relative 04/01/2023 1  % Final   Basophils Absolute 04/01/2023 0.1  0.0 - 0.1 K/uL Final   Immature Granulocytes 04/01/2023 0  % Final   Abs Immature Granulocytes 04/01/2023 0.02  0.00 - 0.07 K/uL Final   Performed at [facility], [address], [place], [place] [address]   Sodium 04/01/2023 141  135 - 145 mmol/L Final   Potassium 04/01/2023 4.1  3.5 - 5.1 mmol/L Final   Chloride 04/01/2023 106  98 - 111 mmol/L Final   CO2 04/01/2023 25  22 - 32 mmol/L Final   Glucose, Bld 04/01/2023 100 (H)  70 - 99 mg/dL Final   Glucose reference range applies only to samples taken after fasting for at least 8 hours.   BUN 04/01/2023 9  6 - 20 mg/dL Final   Creatinine, Ser 04/01/2023 1.10  0.61 - 1.24 mg/dL Final   Calcium 36/64/4034 9.5  8.9 - 10.3 mg/dL Final   Total Protein 74/25/9563 6.5  6.5 - 8.1 g/dL Final   Albumin 87/56/4332 3.9  3.5 - 5.0 g/dL Final   AST 95/18/8416 34  15 - 41 U/L Final   ALT 04/01/2023 37  0 - 44 U/L Final   Alkaline Phosphatase 04/01/2023 73  38 - 126 U/L Final   Total Bilirubin 04/01/2023 1.0  0.3 - 1.2 mg/dL Final   GFR, Estimated 04/01/2023 >60  >60 mL/min Final   Comment: (NOTE) Calculated using the CKD-EPI Creatinine Equation (2021)    Anion gap 04/01/2023 10  5 - 15 Final   Performed at [facility], [address], [place], [place] [address]  Admission on 03/29/2023, Discharged on 03/30/2023  Component Date Value Ref Range Status   Sodium 03/29/2023 139  135 - 145 mmol/L Final   Potassium 03/29/2023 3.5  3.5 - 5.1 mmol/L Final   Chloride  03/29/2023 99  98 - 111 mmol/L Final   CO2 03/29/2023  28  22 - 32 mmol/L Final   Glucose, Bld 03/29/2023 97  70 - 99 mg/dL Final   Glucose reference range applies only to samples taken after fasting for at least 8 hours.   BUN 03/29/2023 8  6 - 20 mg/dL Final   Creatinine, Ser 03/29/2023 1.07  0.61 - 1.24 mg/dL Final   Calcium 69/62/9528 9.5  8.9 - 10.3 mg/dL Final   Total Protein 41/32/4401 7.1  6.5 - 8.1 g/dL Final   Albumin 02/72/5366 4.1  3.5 - 5.0 g/dL Final   AST 44/09/4740 37  15 - 41 U/L Final   ALT 03/29/2023 41  0 - 44 U/L Final   Alkaline Phosphatase 03/29/2023 66  38 - 126 U/L Final   Total Bilirubin 03/29/2023 2.0 (H)  0.3 - 1.2 mg/dL Final   GFR, Estimated 03/29/2023 >60  >60 mL/min Final   Comment: (NOTE) Calculated using the CKD-EPI Creatinine Equation (2021)    Anion gap 03/29/2023 12  5 - 15 Final   Performed at [facility], [address], [place], [place] [address]   Alcohol, Ethyl (B) 03/29/2023 <10  <10 mg/dL Final   Comment: (NOTE) Lowest detectable limit for serum alcohol is 10 mg/dL.  For medical purposes only. Performed at [facility], [address], [place], [place] [address]    Salicylate Lvl 03/29/2023 <7.0 (L)  7.0 - 30.0 mg/dL Final   Performed at [facility], [address] [place], [place] [address]   Acetaminophen (Tylenol), Serum 03/29/2023 <10 (L)  10 - 30 ug/mL Final   Comment: (NOTE) Therapeutic concentrations vary significantly. A range of 10-30 ug/mL  may be an effective concentration for many patients. However, some  are best treated at concentrations outside of this range. Acetaminophen concentrations >150 ug/mL at 4 hours after ingestion  and >50 ug/mL at 12 hours after ingestion are often associated with  toxic reactions.  Performed at [facility], [address], [place], [place] [address]    WBC 03/29/2023 5.6  4.0 - 10.5 K/uL Final   RBC 03/29/2023 4.92  4.22 - 5.81 MIL/uL Final   Hemoglobin  03/29/2023 15.5  13.0 - 17.0 g/dL Final   HCT 66/12/3014 46.6  39.0 - 52.0 % Final   MCV 03/29/2023 94.7  80.0 - 100.0 fL Final   MCH 03/29/2023 31.5  26.0 - 34.0 pg Final   MCHC 03/29/2023 33.3  30.0 - 36.0 g/dL Final   RDW 07/12/3233 12.0  11.5 - 15.5 % Final   Platelets 03/29/2023 231  150 - 400 K/uL Final   nRBC 03/29/2023 0.0  0.0 - 0.2 % Final   Performed at [facility], [address], [place], [place] [address]   Opiates 03/29/2023 NONE DETECTED  NONE DETECTED Final   Cocaine 03/29/2023 NONE DETECTED  NONE DETECTED Final   Benzodiazepines 03/29/2023 NONE DETECTED  NONE DETECTED Final   Amphetamines 03/29/2023 NONE DETECTED  NONE DETECTED Final   Tetrahydrocannabinol 03/29/2023 POSITIVE (A)  NONE DETECTED Final   Barbiturates 03/29/2023 NONE DETECTED  NONE DETECTED Final   Comment: (NOTE) DRUG SCREEN FOR MEDICAL PURPOSES ONLY.  IF CONFIRMATION IS NEEDED FOR ANY PURPOSE, NOTIFY LAB WITHIN 5 DAYS.  LOWEST DETECTABLE LIMITS FOR URINE DRUG SCREEN Drug Class                     Cutoff (ng/mL) Amphetamine and metabolites    1000 Barbiturate and metabolites  200 Benzodiazepine                 200 Opiates and metabolites        300 Cocaine and metabolites        300 THC                            50 Performed at [facility], [address] [place], [place] [address]    Glucose-Capillary 03/29/2023 150 (H)  70 - 99 mg/dL Final   Glucose reference range applies only to samples taken after fasting for at least 8 hours.  Admission on 03/26/2023, Discharged on 03/26/2023  Component Date Value Ref Range Status   Glucose-Capillary 03/26/2023 86  70 - 99 mg/dL Final   Glucose reference range applies only to samples taken after fasting for at least 8 hours.  Admission on 03/26/2023, Discharged on 03/26/2023  Component Date Value Ref Range Status   Sodium 03/26/2023 135  135 - 145 mmol/L Final   Potassium 03/26/2023 3.0 (L)  3.5 - 5.1 mmol/L Final   Chloride  03/26/2023 100  98 - 111 mmol/L Final   CO2 03/26/2023 19 (L)  22 - 32 mmol/L Final   Glucose, Bld 03/26/2023 90  70 - 99 mg/dL Final   Glucose reference range applies only to samples taken after fasting for at least 8 hours.   BUN 03/26/2023 18  6 - 20 mg/dL Final   Creatinine, Ser 03/26/2023 1.26 (H)  0.61 - 1.24 mg/dL Final   Calcium 60/45/4098 9.0  8.9 - 10.3 mg/dL Final   GFR, Estimated 03/26/2023 >60  >60 mL/min Final   Comment: (NOTE) Calculated using the CKD-EPI Creatinine Equation (2021)    Anion gap 03/26/2023 16 (H)  5 - 15 Final   Performed at [facility], [address] [place], [place] [address]   WBC 03/26/2023 7.4  4.0 - 10.5 K/uL Final   RBC 03/26/2023 5.06  4.22 - 5.81 MIL/uL Final   Hemoglobin 03/26/2023 16.0  13.0 - 17.0 g/dL Final   HCT 78/29/5621 46.8  39.0 - 52.0 % Final   MCV 03/26/2023 92.5  80.0 - 100.0 fL Final   MCH 03/26/2023 31.6  26.0 - 34.0 pg Final   MCHC 03/26/2023 34.2  30.0 - 36.0 g/dL Final   RDW 30/86/5784 12.2  11.5 - 15.5 % Final   Platelets 03/26/2023 233  150 - 400 K/uL Final   nRBC 03/26/2023 0.0  0.0 - 0.2 % Final   Neutrophils Relative % 03/26/2023 49  % Final   Neutro Abs 03/26/2023 3.6  1.7 - 7.7 K/uL Final   Lymphocytes Relative 03/26/2023 37  % Final   Lymphs Abs 03/26/2023 2.8  0.7 - 4.0 K/uL Final   Monocytes Relative 03/26/2023 11  % Final   Monocytes Absolute 03/26/2023 0.8  0.1 - 1.0 K/uL Final   Eosinophils Relative 03/26/2023 2  % Final   Eosinophils Absolute 03/26/2023 0.1  0.0 - 0.5 K/uL Final   Basophils Relative 03/26/2023 1  % Final   Basophils Absolute 03/26/2023 0.0  0.0 - 0.1 K/uL Final   Immature Granulocytes 03/26/2023 0  % Final   Abs Immature Granulocytes 03/26/2023 0.02  0.00 - 0.07 K/uL Final   Performed at [facility], [address] [name], [place] [address]   Magnesium 03/26/2023 1.8  1.7 - 2.4 mg/dL Final   Performed at [facility], [address] [place], [place] [address]   Admission on 03/23/2023, Discharged on 03/23/2023  Component Date Value Ref Range Status   Glucose-Capillary 03/23/2023 128 (H)  70 - 99 mg/dL Final   Glucose reference range applies only to samples taken after fasting for at least 8 hours.   Sodium 03/23/2023 142  135 - 145 mmol/L Final   Potassium 03/23/2023 3.3 (L)  3.5 - 5.1 mmol/L Final   Chloride 03/23/2023 107  98 - 111 mmol/L Final   CO2 03/23/2023 25  22 - 32 mmol/L Final   Glucose, Bld 03/23/2023 108 (H)  70 - 99 mg/dL Final   Glucose reference range applies only to samples taken after fasting for at least 8 hours.   BUN 03/23/2023 13  6 - 20 mg/dL Final   Creatinine, Ser 03/23/2023 1.00  0.61 - 1.24 mg/dL Final   Calcium 09/81/1914 9.0  8.9 - 10.3 mg/dL Final   Total Protein 78/29/5621 7.4  6.5 - 8.1 g/dL Final   Albumin 30/86/5784 4.1  3.5 - 5.0 g/dL Final   AST 69/62/9528 34  15 - 41 U/L Final   ALT 03/23/2023 36  0 - 44 U/L Final   Alkaline Phosphatase 03/23/2023 63  38 - 126 U/L Final   Total Bilirubin 03/23/2023 1.8 (H)  0.3 - 1.2 mg/dL Final   GFR, Estimated 03/23/2023 >60  >60 mL/min Final   Comment: (NOTE) Calculated using the CKD-EPI Creatinine Equation (2021)    Anion gap 03/23/2023 10  5 - 15 Final   Performed at [facility] [address], [place], [place] [address]   WBC 03/23/2023 5.7  4.0 - 10.5 K/uL Final   RBC 03/23/2023 4.69  4.22 - 5.81 MIL/uL Final   Hemoglobin 03/23/2023 15.1  13.0 - 17.0 g/dL Final   HCT 40/04/2724 45.4  39.0 - 52.0 % Final   MCV 03/23/2023 96.8  80.0 - 100.0 fL Final   MCH 03/23/2023 32.2  26.0 - 34.0 pg Final   MCHC 03/23/2023 33.3  30.0 - 36.0 g/dL Final   RDW 36/64/4034 12.6  11.5 - 15.5 % Final   Platelets 03/23/2023 208  150 - 400 K/uL Final   nRBC 03/23/2023 0.0  0.0 - 0.2 % Final   Neutrophils Relative % 03/23/2023 62  % Final   Neutro Abs 03/23/2023 3.6  1.7 - 7.7 K/uL Final   Lymphocytes Relative 03/23/2023 25  % Final   Lymphs Abs 03/23/2023 1.5   0.7 - 4.0 K/uL Final   Monocytes Relative 03/23/2023 11  % Final   Monocytes Absolute 03/23/2023 0.6  0.1 - 1.0 K/uL Final   Eosinophils Relative 03/23/2023 1  % Final   Eosinophils Absolute 03/23/2023 0.0  0.0 - 0.5 K/uL Final   Basophils Relative 03/23/2023 1  % Final   Basophils Absolute 03/23/2023 0.0  0.0 - 0.1 K/uL Final   Immature Granulocytes 03/23/2023 0  % Final   Abs Immature Granulocytes 03/23/2023 0.01  0.00 - 0.07 K/uL Final   Performed at [facility], [address], [place], [place] [address]   Magnesium 03/23/2023 2.2  1.7 - 2.4 mg/dL Final   Performed at [facility], [address] [place], [place] [address]   Specimen Source 03/23/2023 URINE, CLEAN CATCH   Final   Color, Urine 03/23/2023 AMBER (A)  YELLOW Final   BIOCHEMICALS MAY BE AFFECTED BY COLOR   APPearance 03/23/2023 CLEAR  CLEAR Final   Specific Gravity,  Urine 03/23/2023 1.030  1.005 - 1.030 Final   pH 03/23/2023 6.0  5.0 - 8.0 Final   Glucose, UA 03/23/2023 NEGATIVE  NEGATIVE mg/dL Final   Hgb urine dipstick 03/23/2023 NEGATIVE  NEGATIVE Final   Bilirubin Urine 03/23/2023 NEGATIVE  NEGATIVE Final   Ketones, ur 03/23/2023 5 (A)  NEGATIVE mg/dL Final   Protein, ur 13/02/6577 NEGATIVE  NEGATIVE mg/dL Final   Nitrite 46/96/2952 NEGATIVE  NEGATIVE Final   Leukocytes,Ua 03/23/2023 NEGATIVE  NEGATIVE Final   RBC / HPF 03/23/2023 0-5  0 - 5 RBC/hpf Final   WBC, UA 03/23/2023 0-5  0 - 5 WBC/hpf Final   Comment:        Reflex urine culture not performed if WBC <=10, OR if Squamous epithelial cells >5. If Squamous epithelial cells >5 suggest recollection.    Bacteria, UA 03/23/2023 NONE SEEN  NONE SEEN Final   Squamous Epithelial / HPF 03/23/2023 0-5  0 - 5 /HPF Final   Mucus 03/23/2023 PRESENT   Final   Performed at [facility], [address], [place], [place] [address]   Opiates 03/23/2023 NONE DETECTED  NONE DETECTED Final   Cocaine 03/23/2023 NONE  DETECTED  NONE DETECTED Final   Benzodiazepines 03/23/2023 NONE DETECTED  NONE DETECTED Final   Amphetamines 03/23/2023 NONE DETECTED  NONE DETECTED Final   Tetrahydrocannabinol 03/23/2023 NONE DETECTED  NONE DETECTED Final   Barbiturates 03/23/2023 NONE DETECTED  NONE DETECTED Final   Comment: (NOTE) DRUG SCREEN FOR MEDICAL PURPOSES ONLY.  IF CONFIRMATION IS NEEDED FOR ANY PURPOSE, NOTIFY LAB WITHIN 5 DAYS.  LOWEST DETECTABLE LIMITS FOR URINE DRUG SCREEN Drug Class                     Cutoff (ng/mL) Amphetamine and metabolites    1000 Barbiturate and metabolites    200 Benzodiazepine                 200 Opiates and metabolites        300 Cocaine and metabolites        300 THC                            50 Performed at [facility], [address], [place], [place] [address]    Alcohol, Ethyl (B) 03/23/2023 <10  <10 mg/dL Final   Comment: (NOTE) Lowest detectable limit for serum alcohol is 10 mg/dL.  For medical purposes only. Performed at [facility], [address], [place], [place] [address]    Levetiracetam Lvl 03/23/2023 25.9  10.0 - 40.0 ug/mL Final   Comment: (NOTE) Performed At: [facility] [address] [place], [place] [id number] Jolene Schimke MD QV:[id number]   There may be more visits with results that are not included.    Blood Alcohol level:  Lab Results  Component Value Date   ETH <10 05/04/2023   ETH <10 04/07/2023    Metabolic Disorder Labs: Lab Results  Component Value Date   HGBA1C 4.7 (L) 04/18/2020   MPG 88 04/18/2020   No results found for: "PROLACTIN" Lab Results  Component Value Date   CHOL 173 03/03/2023   TRIG 103 03/03/2023   HDL 44 03/03/2023   CHOLHDL 3.9 03/03/2023   VLDL 8 04/18/2020   LDLCALC 109 (H) 03/03/2023   LDLCALC 97 08/03/2022    Therapeutic Lab Levels: No results found for: "LITHIUM" No results found for: "VALPROATE"  No results found for: "CBMZ"  Physical  Findings   PHQ2-9    Flowsheet Row Office Visit from 04/25/2023 in [facility] for [facility]ious [facility] Office Visit from 08/17/2022 in [facility] for Infectious Disease Office Visit from 06/30/2021 in [facility] for Infectious Disease ED from 04/04/2021 in [facility] Emergency Department at [facility] ED from 08/13/2020 in [facility]  PHQ-2 Total Score 1 1 2 1 6   PHQ-9 Total Score -- -- -- 3 22      Flowsheet Row ED from 05/03/2023 in [facility] ED from 05/02/2023 in [facility] Emergency Department at [facility] ED from 04/30/2023 in [facility] Emergency Department at [facility]  C-SSRS RISK CATEGORY High Risk Error: Question 6 not populated Error: Question 6 not populated        Musculoskeletal  Strength & Muscle Tone: within normal limits Gait & Station: normal Patient leans: N/A  Psychiatric Specialty Exam  Presentation  General Appearance:  Disheveled  Eye Contact: Minimal  Speech: Slow; Slurred  Speech Volume: Decreased  Handedness: Right   Mood and Affect  Mood: -- ("ok")  Affect: Flat   Thought Process  Thought Processes: Coherent  Descriptions of Associations:Circumstantial  Orientation:Full (Time, Place and Person)  Thought Content:WDL  Diagnosis of Schizophrenia or Schizoaffective disorder in past: No    Hallucinations:Hallucinations: None  Ideas of Reference:None  Suicidal Thoughts:Suicidal Thoughts: No  Homicidal Thoughts:Homicidal Thoughts: No   Sensorium  Memory: Immediate Fair  Judgment: Intact  Insight: Present   Executive Functions  Concentration: Poor  Attention Span: Poor  Recall: Poor  Fund of Knowledge: Fair  Language: Fair   Psychomotor Activity  Psychomotor Activity: Psychomotor Activity: Decreased   Assets  Assets: Desire for Improvement   Sleep   Sleep: Sleep: Good Number of Hours of Sleep: 5   Nutritional Assessment (For OBS and FBC admissions only) Has the patient had a weight loss or gain of 10 pounds or more in the last 3 months?: No Has the patient had a decrease in food intake/or appetite?: No Does the patient have dental problems?: No Does the patient have eating habits or behaviors that may be indicators of an eating disorder including binging or inducing vomiting?: No Has the patient recently lost weight without trying?: 0 Has the patient been eating poorly because of a decreased appetite?: 0 Malnutrition Screening Tool Score: 0    Physical Exam  Physical Exam Constitutional:      General: He is not in acute distress.    Appearance: He is not ill-appearing, toxic-appearing or diaphoretic.  Eyes:     General: No scleral icterus. Cardiovascular:     Rate and Rhythm: Normal rate.  Pulmonary:     Effort: Pulmonary effort is normal. No respiratory distress.  Neurological:     Mental Status: He is oriented to person, place, and time.  Psychiatric:        Attention and Perception: Perception normal. He is inattentive.        Mood and Affect: Mood normal. Affect is flat.        Speech: Speech is slurred.        Behavior: Behavior is slowed. Behavior is cooperative.        Thought Content: Thought content normal.    Review of Systems  Constitutional:  Negative for chills and fever.  Respiratory:  Negative for shortness of breath.   Cardiovascular:  Negative  for chest pain and palpitations.  Gastrointestinal:  Negative for abdominal pain.  Neurological:  Negative for headaches.  Psychiatric/Behavioral:  Negative for suicidal ideas.   Negative for homicidal ideations. Negative for auditory visual hallucinations or paranoia.  Blood pressure 112/81, pulse 96, temperature 98.3 F (36.8 C), temperature source Oral, resp. rate 18, SpO2 100%. There is no height or weight on file to calculate BMI.  Treatment Plan  Summary: Daily contact with patient to assess and evaluate symptoms and progress in treatment, Medication management, and Plan    -Continue current medications -Consider transfer to [facility]  Lauree Chandler, NP 05/04/2023 9:36 AM

## 2023-05-04 NOTE — ED Notes (Signed)
Pt asleep at this hour. No apparent distress. RR even and unlabored. Monitored for safety.  

## 2023-05-04 NOTE — ED Notes (Signed)
Patient has been brought on the unit, familiarized with unit and is now eating. Pt is in no acute distress, will continue to monitor patient for safety.

## 2023-05-04 NOTE — Telephone Encounter (Signed)
Refills were sent by inpatient team on 10/29.  Sandie Ano, RN

## 2023-05-04 NOTE — ED Notes (Signed)
Patient is sitting bed quiet in no acute distress, will continue to monitor patient for safety

## 2023-05-05 ENCOUNTER — Encounter (HOSPITAL_COMMUNITY): Payer: Self-pay

## 2023-05-05 DIAGNOSIS — Z21 Asymptomatic human immunodeficiency virus [HIV] infection status: Secondary | ICD-10-CM | POA: Diagnosis not present

## 2023-05-05 DIAGNOSIS — F15250 Other stimulant dependence with stimulant-induced psychotic disorder with delusions: Secondary | ICD-10-CM | POA: Insufficient documentation

## 2023-05-05 DIAGNOSIS — F329 Major depressive disorder, single episode, unspecified: Secondary | ICD-10-CM | POA: Insufficient documentation

## 2023-05-05 DIAGNOSIS — Z5900 Homelessness unspecified: Secondary | ICD-10-CM | POA: Insufficient documentation

## 2023-05-05 DIAGNOSIS — F419 Anxiety disorder, unspecified: Secondary | ICD-10-CM | POA: Diagnosis not present

## 2023-05-05 DIAGNOSIS — F17200 Nicotine dependence, unspecified, uncomplicated: Secondary | ICD-10-CM | POA: Insufficient documentation

## 2023-05-05 MED ORDER — BISMUTH SUBSALICYLATE 262 MG PO CHEW: 524.0000 mg | CHEWABLE_TABLET | ORAL | Status: DC | PRN

## 2023-05-05 MED ORDER — LORAZEPAM 2 MG/ML IJ SOLN: 1.0000 mg | Freq: Four times a day (QID) | INTRAMUSCULAR | Status: DC | PRN

## 2023-05-05 MED ORDER — ALUM & MAG HYDROXIDE-SIMETH 200-200-20 MG/5ML PO SUSP: 30.0000 mL | ORAL | Status: DC | PRN

## 2023-05-05 MED ORDER — HYDROXYZINE HCL 25 MG PO TABS
25.0000 mg | ORAL_TABLET | Freq: Three times a day (TID) | ORAL | Status: DC | PRN
  Administered 2023-05-05 – 2023-05-07 (×3): 25 mg via ORAL
  Filled 2023-05-05 (×3): qty 1

## 2023-05-05 MED ORDER — ONDANSETRON HCL 4 MG PO TABS: 8.0000 mg | ORAL_TABLET | Freq: Three times a day (TID) | ORAL | Status: DC | PRN

## 2023-05-05 MED ORDER — SENNA 8.6 MG PO TABS: 1.0000 | ORAL_TABLET | Freq: Every evening | ORAL | Status: DC | PRN

## 2023-05-05 MED ORDER — DIPHENHYDRAMINE HCL 50 MG/ML IJ SOLN: 25.0000 mg | Freq: Four times a day (QID) | INTRAMUSCULAR | Status: DC | PRN

## 2023-05-05 MED ORDER — ACETAMINOPHEN 325 MG PO TABS: 650.0000 mg | ORAL_TABLET | Freq: Four times a day (QID) | ORAL | Status: DC | PRN

## 2023-05-05 MED ORDER — OLANZAPINE 10 MG PO TABS
10.0000 mg | ORAL_TABLET | Freq: Every day | ORAL | Status: DC
  Administered 2023-05-05 – 2023-05-06 (×2): 10 mg via ORAL
  Filled 2023-05-05 (×2): qty 1

## 2023-05-05 MED ORDER — DIPHENHYDRAMINE HCL 25 MG PO CAPS: 25.0000 mg | ORAL_CAPSULE | Freq: Four times a day (QID) | ORAL | Status: DC | PRN

## 2023-05-05 MED ORDER — LORAZEPAM 1 MG PO TABS: 1.0000 mg | ORAL_TABLET | Freq: Four times a day (QID) | ORAL | Status: DC | PRN

## 2023-05-05 MED ORDER — NICOTINE POLACRILEX 2 MG MT GUM
2.0000 mg | CHEWING_GUM | OROMUCOSAL | Status: DC | PRN
  Administered 2023-05-07: 2 mg via ORAL
  Filled 2023-05-05: qty 20
  Filled 2023-05-05: qty 1

## 2023-05-05 MED ORDER — OLANZAPINE 5 MG PO TBDP: 5.0000 mg | ORAL_TABLET | Freq: Four times a day (QID) | ORAL | Status: DC | PRN

## 2023-05-05 MED ORDER — HALOPERIDOL LACTATE 5 MG/ML IJ SOLN: 5.0000 mg | Freq: Four times a day (QID) | INTRAMUSCULAR | Status: DC | PRN

## 2023-05-05 MED ORDER — POLYETHYLENE GLYCOL 3350 17 G PO PACK: 17.0000 g | PACK | Freq: Every day | ORAL | Status: DC | PRN

## 2023-05-05 NOTE — ED Notes (Signed)
Patient in the bedroom composed and sleeping. NAD, Respirations are even and unlabored. Will keep monitoring for safety.

## 2023-05-05 NOTE — BH IP Treatment Plan (Signed)
Interdisciplinary Treatment and Diagnostic Plan Update  05/05/2023 Time of Session: 10:20am Justin [name] MRN: [id number]  Diagnosis:  Final diagnoses:  Substance induced mood disorder (HCC)  Stimulant use disorder  Cannabis use disorder  Human immunodeficiency virus (HIV) disease (HCC)     Current Medications:  Current Facility-Administered Medications  Medication Dose Route Frequency Provider Last Rate Last Admin   acetaminophen (TYLENOL) tablet 650 mg  650 mg Oral Q6H PRN Augusto Gamble, MD       alum & mag hydroxide-simeth (MAALOX/MYLANTA) 200-200-20 MG/5ML suspension 30 mL  30 mL Oral Q4H PRN Augusto Gamble, MD       bictegravir-emtricitabine-tenofovir AF (BIKTARVY) 50-200-25 MG per tablet 1 tablet  1 tablet Oral Daily Lauree Chandler, NP   1 tablet at 05/05/23 0908   bismuth subsalicylate (PEPTO BISMOL) chewable tablet 524 mg  524 mg Oral Q3H PRN Augusto Gamble, MD       OLANZapine zydis (ZYPREXA) disintegrating tablet 5 mg  5 mg Oral Q6H PRN Augusto Gamble, MD       And   LORazepam (ATIVAN) tablet 1 mg  1 mg Oral Q6H PRN Augusto Gamble, MD       And   diphenhydrAMINE (BENADRYL) capsule 25 mg  25 mg Oral Q6H PRN Augusto Gamble, MD       haloperidol lactate (HALDOL) injection 5 mg  5 mg Intramuscular Q6H PRN Augusto Gamble, MD       And   LORazepam (ATIVAN) injection 1 mg  1 mg Intravenous Q6H PRN Augusto Gamble, MD       And   diphenhydrAMINE (BENADRYL) injection 25 mg  25 mg Intramuscular Q6H PRN Augusto Gamble, MD       escitalopram (LEXAPRO) tablet 20 mg  20 mg Oral Daily Lauree Chandler, NP   20 mg at 05/05/23 0908   hydrOXYzine (ATARAX) tablet 25 mg  25 mg Oral TID PRN Augusto Gamble, MD       levETIRAcetam (KEPPRA) tablet 1,000 mg  1,000 mg Oral BID Lauree Chandler, NP   1,000 mg at 05/05/23 3875   nicotine (NICODERM CQ - dosed in mg/24 hours) patch 14 mg  14 mg Transdermal Daily Bobbitt, Shalon E, NP   14 mg at 05/05/23 6433   nicotine polacrilex (NICORETTE) gum 2 mg  2 mg Oral PRN  Augusto Gamble, MD       OLANZapine (ZYPREXA) tablet 10 mg  10 mg Oral QHS Augusto Gamble, MD       ondansetron [facility]) tablet 8 mg  8 mg Oral Q8H PRN Augusto Gamble, MD       polyethylene glycol (MIRALAX / GLYCOLAX) packet 17 g  17 g Oral Daily PRN Augusto Gamble, MD       senna (SENOKOT) tablet 8.6 mg  1 tablet Oral QHS PRN Augusto Gamble, MD       traZODone (DESYREL) tablet 50 mg  50 mg Oral QHS PRN Lauree Chandler, NP   50 mg at 05/04/23 2108   Current Outpatient Medications  Medication Sig Dispense Refill   acetaminophen (TYLENOL) 500 MG tablet Take 500-1,000 mg by mouth every 6 (six) hours as needed for mild pain (pain score 1-3), moderate pain (pain score 4-6) or headache.     bictegravir-emtricitabine-tenofovir AF (BIKTARVY) 50-200-25 MG TABS tablet Take 1 tablet by mouth daily. 30 tablet 0   escitalopram (LEXAPRO) 20 MG tablet Take 1 tablet (20 mg total) by mouth daily. 30 tablet 0   levETIRAcetam (KEPPRA) 1000  MG tablet Take 1 tablet (1,000 mg total) by mouth 2 (two) times daily. 60 tablet 0   OLANZapine (ZYPREXA) 10 MG tablet Take 1 tablet (10 mg total) by mouth at bedtime. 30 tablet 0   PTA Medications: Prior to Admission medications   Medication Sig Start Date End Date Taking? Authorizing Provider  acetaminophen (TYLENOL) 500 MG tablet Take 500-1,000 mg by mouth every 6 (six) hours as needed for mild pain (pain score 1-3), moderate pain (pain score 4-6) or headache.    [provider]  bictegravir-emtricitabine-tenofovir AF (BIKTARVY) 50-200-25 MG TABS tablet Take 1 tablet by mouth daily. 05/03/23 06/02/23  Earney Navy, NP  escitalopram (LEXAPRO) 20 MG tablet Take 1 tablet (20 mg total) by mouth daily. 05/03/23 06/02/23  Earney Navy, NP  levETIRAcetam (KEPPRA) 1000 MG tablet Take 1 tablet (1,000 mg total) by mouth 2 (two) times daily. 03/30/23   Rolan Bucco, MD  OLANZapine (ZYPREXA) 10 MG tablet Take 1 tablet (10 mg total) by mouth at bedtime. 05/02/23 06/01/23   Earney Navy, NP    Patient Stressors: Medication change or noncompliance   Substance abuse   Other: homelessness    Patient Strengths: Ability for insight  Capable of independent living  Communication skills  General fund of knowledge  Motivation for treatment/growth   Treatment Modalities: Medication Management, Group therapy, Case management,  1 to 1 session with clinician, Psychoeducation, Recreational therapy.   Physician Treatment Plan for Primary and Secondary Diagnosis:  Final diagnoses:  Substance induced mood disorder (HCC)  Stimulant use disorder  Cannabis use disorder  Human immunodeficiency virus (HIV) disease (HCC)   Long Term Goal(s):    Short Term Goals:    Medication Management: Evaluate patient's response, side effects, and tolerance of medication regimen.  Therapeutic Interventions: 1 to 1 sessions, Unit Group sessions and Medication administration.  Evaluation of Outcomes: Progressing  LCSW Treatment Plan for Primary Diagnosis:  Final diagnoses:  Substance induced mood disorder (HCC)  Stimulant use disorder  Cannabis use disorder  Human immunodeficiency virus (HIV) disease (HCC)    Long Term Goal(s): Safe transition to appropriate next level of care at discharge.  Short Term Goals: Facilitate acceptance of mental health diagnosis and concerns through verbal commitment to aftercare plan and appointments at discharge., Patient will identify one social support prior to discharge to aid in patient's recovery., Patient will attend AA/NA groups as scheduled., Identify minimum of 2 triggers associated with mental health/substance abuse issues with treatment team members., and Increase skills for wellness and recovery by attending 50% of scheduled groups.  Therapeutic Interventions: Assess for all discharge needs, 1 to 1 time with Child psychotherapist, Explore available resources and support systems, Assess for adequacy in community support network, Educate  family and significant other(s) on suicide prevention, Complete Psychosocial Assessment, Interpersonal group therapy.  Evaluation of Outcomes: Progressing   Progress in Treatment: Attending groups: Yes. Participating in groups: Yes. Taking medication as prescribed: Yes. Toleration medication: Yes. Family/Significant other contact made: Yes, individual(s) contacted:  Patient has provided permission for LCSW to follow up with his friend Donne Anon [phone] regarding his disposition plans Patient understands diagnosis: Yes. Discussing patient identified problems/goals with staff: Yes. Medical problems stabilized or resolved: Yes. Denies suicidal/homicidal ideation: Yes. Issues/concerns per patient self-inventory: Yes. Other: substance use, mental health, and need for further treatment.   New problem(s) identified: No, Describe:  other than reported on admission.   New Short Term/Long Term Goal(s): Safe transition to appropriate next level of care  at discharge, Engage patient in therapeutic group addressing interpersonal concerns. Engage patient in aftercare planning with referrals and resources, Increase ability to appropriately verbalize feelings, Facilitate acceptance of mental health diagnosis and concerns and Identify triggers associated with mental health/substance abuse issues.   Patient Goals: Patient reports an interest in securing longterm placement within 3-5 days.  Discharge Plan or Barriers: Patient has been accepted to [facility] in [place], [place] and can admit on Monday 05/08/2023 by 10:00 AM. Address to report is: [address] [place], [place] [address]. Number to call for emergencies: [phone].   Reason for Continuation of Hospitalization: Medication stabilization  Estimated Length of Stay: 3-5 days  Last 3 [place] Suicide Severity Risk Score: Flowsheet Row ED from 05/04/2023 in [facility] ED from 05/03/2023 in [facility] ED from 05/02/2023 in [facility] Emergency Department at [facility]  C-SSRS RISK CATEGORY High Risk High Risk Error: Question 6 not populated       Last [facility] 2/9 Scores:    05/04/2023    3:39 PM 04/25/2023    3:11 PM 08/17/2022    3:38 PM  Depression screen PHQ 2/9  Decreased Interest 1 0 0  Down, Depressed, Hopeless 3 1 1   PHQ - 2 Score 4 1 1   Altered sleeping 3    Tired, decreased energy 3    Change in appetite 3    Feeling bad or failure about yourself  3    Trouble concentrating 3    Moving slowly or fidgety/restless 2    Suicidal thoughts 2    PHQ-9 Score 23    Difficult doing work/chores Extremely dIfficult      Scribe for Treatment Team: Loleta Dicker, LCSW 05/05/2023 2:13 PM

## 2023-05-05 NOTE — ED Notes (Signed)
Patient is asleep in bed without issue or complaint.

## 2023-05-05 NOTE — ED Notes (Signed)
Pt states that he slept well last night.

## 2023-05-05 NOTE — ED Notes (Signed)
Pt asleep at this hour. No apparent distress. RR even and unlabored. Monitored for safety.  

## 2023-05-05 NOTE — Tx Team (Signed)
LCSW, MD, and Resident met with patient to assess current mood, affect, physical state, and inquire about needs/goals while here in [facility] and after discharge. Patient reports due to needing help with his substance use. Per chart, patient's UDS was positive for amphetamines and THC. Patient reports he is "tired of having seizures, tired of schizophrenia, and tired of using drugs". Patient reports his goal is to go to a residential placement that is longer than 30 days. Patient reports he believes the best thing for him is to go to an environment that has more structure. Patient reports he has been homeless and has been sleeping on the back porch of [employer] in [place], which is an agency that supports individuals with HIV. However, reports he has not been able to secure a bed within the agency. Patient reports his only support system is a local friend in [place].  Patient reports having no connection with his family.  Patient reports poor relationship with mother, stating "my whole family are addicts". Patient reports growing up in the foster care system and group homes since high school. Patient reports a past history of working, stating he was at [employer] about a month ago. 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 Patient initially denied having any upcoming court dates or legal charges. Patient then stated, he received a trespassing violation however is unsure if there is a court date for it. Patient reports he is not on any probation or parole.  Patient reported a desire to go to [facility] in [place], however reports he was not accepted due to being on Keppra.  Patient aware that LCSW will follow up with [facility] to confirm. Updates will be provided as received. Patient has provided LCSW with permission to send his clinical information to agencies within the area.  Patient  reports an interest in being screened by Admissions Coordinator Alcario Drought from [facility]. Contact information will be provided to the patient for his follow-up.    Per conversation with [facility] Recovery Admissions Coordinator Marcelino Duster, patient will need to be cleared by her neurologist or his PCP in order to be considered at their agency.  LCSW contacted Cicero Duck at [facility] [phone] regarding patient's referral. Per Alcario Drought, she is willing to speak with the patient regarding possible admission to their agency. Alcario Drought spoke with the patient and informed LCSW the patient appears to be a good candidate for admission.  Per Alcario Drought, agency can admit the patient on Monday 05/08/2023 by 10:00 AM. Transportation will be arranged by the [facility].  Patient was informed of current updates and no other needs were reported by the patient.  Patient was appreciative of LCSW assistance with finding placement for him.  LCSW will continue to follow and provide support to patient while on [facility] unit.  Fernande Boyden, LCSW Clinical Social Worker [facility] Ph: [phone]

## 2023-05-05 NOTE — ED Notes (Signed)
Patient in the Dayroom watching TV with other patients. Patient stated that he fel sad inside him He mentioned his family are no more talking to him because of the lifestyle and he is ready for a change. Patient reassured. Respirations are even and unlabored. Will continue to monitor for safety.

## 2023-05-05 NOTE — Group Note (Signed)
Group Topic: Recovery Basics  Group Date: 05/05/2023 Start Time: 1030 End Time: 1100 Facilitators: Jenean Lindau, RN  Department: [facility]  Number of Participants: 3  Group Focus: chemical dependency education Treatment Modality:  Cognitive Behavioral Therapy Interventions utilized were patient education Purpose: enhance coping skills, express feelings, increase insight, and relapse prevention strategies  Name: Justin [name] Date of Birth: Jan 16, 1981  MR: [id number]    Level of Participation: active Quality of Participation: attentive Interactions with others: gave feedback Mood/Affect: appropriate Triggers (if applicable):   Cognition: coherent/clear Progress: Gaining insight Response:   Plan: follow-up needed  Patients Problems:  Patient Active Problem List   Diagnosis Date Noted   MDD (major depressive disorder) 05/04/2023   Severe episode of recurrent major depressive disorder, with psychotic features (HCC) 05/03/2023   Suicidal thoughts 05/03/2023   Homicidal ideation 04/07/2023   Cannabis abuse 04/07/2023   Suicidal ideation 04/07/2023   Homelessness 03/30/2023   Cannabis use disorder, severe, dependence (HCC) 03/30/2023   Malingering 03/30/2023   Housing instability due to imminent risk of homelessness 03/03/2023   URI with cough and congestion 03/03/2023   MDD (major depressive disorder), recurrent severe, without psychosis (HCC) 01/18/2023   MDD (major depressive disorder), recurrent, severe, 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/06/2023   Seizure (HCC) 12/24/2021   Seizure disorder (HCC) 10/28/2021   Seizures (HCC) 07/31/2021   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   Suicidal ideations 07/08/2020   Substance use disorder 08/07/2019   Chronic hepatitis C without hepatic coma (HCC) 08/17/2017   Screening examination for venereal disease 05/03/2017   Encounter for long-term (current) use of high-risk medication 05/03/2017    Anxiety 09/26/2016   Human immunodeficiency virus (HIV) disease (HCC) 09/02/2014

## 2023-05-05 NOTE — ED Provider Notes (Cosign Needed Addendum)
Facility Based Crisis Admission H&P  Date: 05/05/23 Patient Name: Justin [name] MRN: [id number] Chief Complaint:   Diagnoses:  Final diagnoses:  Substance induced mood disorder (HCC)  Stimulant use disorder  Cannabis use disorder  Human immunodeficiency virus (HIV) disease (HCC)   HPI:  Justin [name] is a 42 y.o., male experiencing homelessness with a past psychiatric history of MDD, unspecified anxiety, substance-induced mood disorder, x1 prior psychiatric hospitalizations (once at [facility] for suicide attempt) and significant PMHx of seizure disorder  who presents to the Facility Based Crisis center from behavioral health urgent care [facility]) for evaluation and management of suicidal ideations in the setting of chronic medical issues, psychosocial stressors, and recurrent drug use.  Patient endorses continued suicidal ideations ("to overdose on a bunch of pills"). He reports his last seizure episode was last week.  He endorses relapsing on methamphetamines after being clean for some time and reports spending $100 a week on meth.  He reports consistently taking his psychotropic medications prior to admission. He states he has never been officially diagnosed with schizophrenia but was placed on olanzapine because of auditory and visual hallucinations.  Patient says he is very motivated to "get help" and is open to a long-term stay at a residential facility.   PHQ 2-9:  Flowsheet Row ED from 05/04/2023 in [facility] ED from 04/04/2021 in [facility] Emergency Department at [facility] ED from 08/13/2020 in [facility]  Thoughts that you would be better off dead, or of hurting yourself in some way More than half the days Not at all More than half the days  PHQ-9 Total Score 23 3 22        Flowsheet Row ED from 05/04/2023 in [facility] ED from 05/03/2023 in [facility] ED from 05/02/2023 in [facility] Emergency Department at [facility]  C-SSRS RISK CATEGORY High Risk High Risk Error: Question 6 not populated        Total Time spent with patient: 1 hour  Musculoskeletal  Strength & Muscle Tone: within normal limits Gait & Station: normal Patient leans: N/A  Psychiatric Specialty Exam  Presentation General Appearance: Fairly Groomed   Eye Contact:Good   Speech:Clear and Coherent; Normal Rate   Speech Volume:Decreased   Handedness:-- (not assessed)   Mood and Affect  Mood:-- ("I don't wanna wake up")   Affect:Flat; Appropriate; Congruent   Thought Process  Thought Processes:Coherent; Goal Directed; Linear   Descriptions of Associations:Intact   Orientation:Full (Time, Place and Person)   Thought Content:WDL; Logical  Diagnosis of Schizophrenia or Schizoaffective disorder in past: Yes    Hallucinations:Hallucinations: None Description of Auditory Hallucinations: none at present Description of Visual Hallucinations: none at present   Ideas of Reference:None   Suicidal Thoughts:Suicidal Thoughts: Yes, Active SI Active Intent and/or Plan: With Plan; Without Means to Carry Out; Without Access to Means   Homicidal Thoughts:Homicidal Thoughts: No HI Active Intent and/or Plan: Without Intent HI Passive Intent and/or Plan: Without Intent   Sensorium  Memory:Immediate Good; Recent Good; Remote Good   Judgment:Fair   Insight:Fair   Executive Functions  Concentration:Good   Attention Span:Good   Recall:Good   Fund of Knowledge:Good   Language:Good   Psychomotor Activity  Psychomotor Activity:Psychomotor Activity: Normal   Assets  Assets:Resilience   Sleep  Sleep:Sleep: Fair   No data recorded  Physical Exam Vitals and nursing note reviewed.  HENT:     Head:  Normocephalic and atraumatic.  Pulmonary:     Effort: Pulmonary effort is normal.  Musculoskeletal:      Cervical back: Normal range of motion.  Neurological:     General: No focal deficit present.     Mental Status: He is alert.    Review of Systems  Constitutional: Negative.   Respiratory: Negative.    Cardiovascular: Negative.   Gastrointestinal: Negative.   Genitourinary: Negative.   Psychiatric/Behavioral:         Psychiatric subjective data addressed in PSE or HPI / daily subjective report   Blood pressure 116/86, pulse 72, temperature (!) 97.4 F (36.3 C), temperature source Oral, resp. rate 18, SpO2 99%. There is no height or weight on file to calculate BMI.  Past Psychiatric History: schizoaffective disorder vs MDD, substance-induced moo disorder, unspecified anxiety  Is the patient at risk to self? Yes Has the patient been a risk to self in the past 6 months? Unknown Has the patient been a risk to self within the distant past? Yes Is the patient a risk to others? No Has the patient been a risk to others in the past 6 months? No Has the patient been a risk to others within the distant past? No  Past Medical History: chronic HCV infection, HIV, and seizure disorder Family History: DM II in mother Social History: currently homeless, reports he has some friends but no family in area  Last Labs:     Latest Ref Rng & Units 05/02/2023    7:30 PM 04/30/2023    5:00 AM 04/07/2023    6:40 PM  CMP  Glucose 70 - 99 mg/dL 130  88  92   BUN 6 - 20 mg/dL 14  26  10    Creatinine 0.61 - 1.24 mg/dL 8.65  7.84  6.96   Sodium 135 - 145 mmol/L 139  137  142   Potassium 3.5 - 5.1 mmol/L 3.6  3.5  3.9   Chloride 98 - 111 mmol/L 103  101  104   CO2 22 - 32 mmol/L 25  20  26    Calcium 8.9 - 10.3 mg/dL 9.1  9.3  9.6   Total Protein 6.5 - 8.1 g/dL 7.4  7.8  8.0   Total Bilirubin 0.3 - 1.2 mg/dL 1.9  2.8  1.9   Alkaline Phos 38 - 126 U/L 68  81  85   AST 15 - 41 U/L 25  53  25   ALT 0 - 44 U/L 27  37  33   CBC    Component Value Date/Time   WBC 8.1 05/02/2023 1930   RBC 4.82  05/02/2023 1930   HGB 15.7 05/02/2023 1930   HCT 46.8 05/02/2023 1930   PLT 206 05/02/2023 1930   MCV 97.1 05/02/2023 1930   MCH 32.6 05/02/2023 1930   MCHC 33.5 05/02/2023 1930   RDW 11.9 05/02/2023 1930   LYMPHSABS 1.8 05/02/2023 1930   MONOABS 0.5 05/02/2023 1930   EOSABS 0.0 05/02/2023 1930   BASOSABS 0.0 05/02/2023 1930    Allergies: Patient has no known allergies.  PTA Medications: (Not in a hospital admission)   Long Term Goals: Improvement in symptoms so as ready for discharge  Short Term Goals: Patient will verbalize feelings in meetings with treatment team members. and Patient will take medications as prescribed daily.  Medical Decision Making  -- continue escitalopram 20 mg daily for depressive symptoms -- continue olanzapine 10 mg daily at bedtime for auditory and visual hallucinations --  medical management: levetiracetam, bictegravir-emctricitabine-tenofovir -- Ambulatory infectious disease referral placed -- Patient in need of nicotine replacement; nicotine polacrilex (gum) and nicotine patch 14 mg / 24 hours ordered. Smoking cessation encouraged  PRNs              -- start acetaminophen 650 mg every 6 hours as needed for mild to moderate pain, fever, and headaches              -- start hydroxyzine 25 mg three times a day as needed for anxiety              -- start bismuth subsalicylate 524 mg oral chewable tablet every 3 hours as needed for diarrhea / loose stools              -- start senna 8.6 mg oral at bedtime as needed and polyethylene glycol 17 g oral daily as needed for mild to moderate constipation              -- start ondansetron 8 mg every 8 hours as needed for nausea or vomiting              -- start aluminum-magnesium hydroxide + simethicone 30 mL every 4 hours as needed for heartburn or indigestion              -- start trazodone 50 mg at bedtime as needed for insomnia  -- As needed agitation protocol in-place  Recommendations  Based on my  evaluation the patient does not appear to have an emergency medical condition.  Augusto Gamble, MD 05/05/23  11:23 AM   Augusto Gamble, MD 05/05/23 1128

## 2023-05-05 NOTE — Discharge Instructions (Signed)
Patient has been accepted to [facility] in [place], [place] and can admit on Monday 05/08/2023 by 10:00 AM. Address to report is: [address] [place], [place] [address]. Number to call for emergencies: [phone].   HALFWAY HOUSES:  [employer] [phone]  [email or web address]  12 STEP PROGRAMS:  Alcoholics Anonymous of [place] SoftwareChalet.be  Narcotics Anonymous of [place] [employer]ct.dk  Al-Anon of [place], [place] www.[place]alanon.org/find-meetings.html  Nar-Anon https://nar-anon.org/find-[email or web address]-meetin

## 2023-05-05 NOTE — Group Note (Signed)
Group Topic: Communication  Group Date: 05/05/2023 Start Time: 2000 End Time: 2100 Facilitators: Darin Engels  Department: [facility]  Number of Participants: 2  Group Focus: acceptance, anxiety, communication, coping skills, and goals/reality orientation Treatment Modality:  Leisure Development Interventions utilized were leisure development and story telling Purpose: enhance coping skills, express feelings, improve communication skills, increase insight, regain self-worth, and relapse prevention strategies  Name: Justin [name] Date of Birth: 04/12/1981  MR: [id number]    Level of Participation: active Quality of Participation: attentive, cooperative, offered feedback, and supportive Interactions with others: gave feedback Mood/Affect: appropriate and positive Triggers (if applicable): n/a Cognition: coherent/clear and insightful Progress: Gaining insight Response: Pt states he is ready to stop using and wants to continue on his rehabilitation journey.  Plan: patient will be encouraged to continue attending groups.   Patients Problems:  Patient Active Problem List   Diagnosis Date Noted   MDD (major depressive disorder) 05/04/2023   Severe episode of recurrent major depressive disorder, with psychotic features (HCC) 05/03/2023   Suicidal thoughts 05/03/2023   Homicidal ideation 04/07/2023   Cannabis abuse 04/07/2023   Suicidal ideation 04/07/2023   Homelessness 03/30/2023   Cannabis use disorder, severe, dependence (HCC) 03/30/2023   Malingering 03/30/2023   Housing instability due to imminent risk of homelessness 03/03/2023   URI with cough and congestion 03/03/2023   MDD (major depressive disorder), recurrent severe, without psychosis (HCC) 01/18/2023   MDD (major depressive disorder), recurrent, severe, 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/06/2023   Seizure (HCC) 12/24/2021   Seizure disorder (HCC) 10/28/2021   Seizures (HCC) 07/31/2021   Substance  induced mood disorder (HCC) 09/08/2020   Suicidal ideations 07/08/2020   Substance use disorder 08/07/2019   Chronic hepatitis C without hepatic coma (HCC) 08/17/2017   Screening examination for venereal disease 05/03/2017   Encounter for long-term (current) use of high-risk medication 05/03/2017   Anxiety 09/26/2016   Human immunodeficiency virus (HIV) disease (HCC) 09/02/2014

## 2023-05-05 NOTE — ED Notes (Signed)
Pt is outside with [facility] doing group.

## 2023-05-05 NOTE — ED Notes (Signed)
Patient ate dinner and went to room to rest where he remains at this time.  No distress, no complaints.  Patient denies avh shi or plan.  He is pleasant and makes needs known.  Will monitor.

## 2023-05-06 DIAGNOSIS — F329 Major depressive disorder, single episode, unspecified: Secondary | ICD-10-CM | POA: Diagnosis not present

## 2023-05-06 DIAGNOSIS — F419 Anxiety disorder, unspecified: Secondary | ICD-10-CM | POA: Diagnosis not present

## 2023-05-06 DIAGNOSIS — F15250 Other stimulant dependence with stimulant-induced psychotic disorder with delusions: Secondary | ICD-10-CM | POA: Diagnosis not present

## 2023-05-06 DIAGNOSIS — Z21 Asymptomatic human immunodeficiency virus [HIV] infection status: Secondary | ICD-10-CM | POA: Diagnosis not present

## 2023-05-06 NOTE — Group Note (Signed)
Group Topic: Recovery Basics  Group Date: 05/06/2023 Start Time: 1215 End Time: 1240 Facilitators: Jenean Lindau, RN  Department: [facility]  Number of Participants: 5  Group Focus: chemical dependency education Treatment Modality:  Cognitive Behavioral Therapy Interventions utilized were patient education Purpose: explore maladaptive thinking, express feelings, increase insight, regain self-worth, and relapse prevention strategies  Name: SAATHVIK EVERY Date of Birth: 1981/04/27  MR: [id number]    Level of Participation: minimal Quality of Participation: attentive Interactions with others: gave feedback Mood/Affect: appropriate Triggers (if applicable):   Cognition: coherent/clear Progress: Gaining insight Response:   Plan: follow-up needed  Patients Problems:  Patient Active Problem List   Diagnosis Date Noted   MDD (major depressive disorder) 05/04/2023   Severe episode of recurrent major depressive disorder, with psychotic features (HCC) 05/03/2023   Suicidal thoughts 05/03/2023   Homicidal ideation 04/07/2023   Cannabis abuse 04/07/2023   Suicidal ideation 04/07/2023   Homelessness 03/30/2023   Cannabis use disorder, severe, dependence (HCC) 03/30/2023   Malingering 03/30/2023   Housing instability due to imminent risk of homelessness 03/03/2023   URI with cough and congestion 03/03/2023   MDD (major depressive disorder), recurrent severe, without psychosis (HCC) 01/18/2023   MDD (major depressive disorder), recurrent, severe, 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/06/2023   Seizure (HCC) 12/24/2021   Seizure disorder (HCC) 10/28/2021   Seizures (HCC) 07/31/2021   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   Suicidal ideations 07/08/2020   Amphetamine and psychostimulant-induced psychosis with delusions (HCC)    Amphetamine use disorder, 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   Substance use  disorder 08/07/2019   Chronic hepatitis C without hepatic coma (HCC) 08/17/2017   Screening examination for venereal disease 05/03/2017   Encounter for long-term (current) use of high-risk medication 05/03/2017   Anxiety 09/26/2016   Human immunodeficiency virus (HIV) disease (HCC) 09/02/2014

## 2023-05-06 NOTE — ED Notes (Signed)
Patient in the bedroom composed and sleeping. NAD, Respirations are even and unlabored. Will keep monitoring for safety.

## 2023-05-06 NOTE — Group Note (Unsigned)
Group Topic: Recovery Basics  Group Date: 05/06/2023 Start Time: 2000 End Time: 2100 Facilitators: Guss Bunde  Department: [facility]  Number of Participants: 5  Group Focus: chemical dependency education Treatment Modality:  Patient-Centered Therapy Interventions utilized were group exercise Purpose: relapse prevention strategies   Name: Justin [name] Date of Birth: [birth year]  MR: [id number]    Level of Participation: {THERAPIES; PSYCH GROUP PARTICIPATION [id number]:19147} Quality of Participation: {THERAPIES; PSYCH QUALITY OF PARTICIPATION:23992} Interactions with others: {THERAPIES; PSYCH INTERACTIONS:23993} Mood/Affect: {THERAPIES; PSYCH MOOD/AFFECT:23994} Triggers (if applicable): *** Cognition: {THERAPIES; PSYCH COGNITION:23995} Progress: {THERAPIES; PSYCH PROGRESS:23997} Response: *** Plan: {THERAPIES; PSYCH WGNF:62130}  Patients Problems:  Patient Active Problem List   Diagnosis Date Noted   MDD (major depressive disorder) 05/04/2023   Severe episode of recurrent major depressive disorder, with psychotic features (HCC) 05/03/2023   Suicidal thoughts 05/03/2023   Homicidal ideation 04/07/2023   Cannabis abuse 04/07/2023   Suicidal ideation 04/07/2023   Homelessness 03/30/2023   Cannabis use disorder, severe, dependence (HCC) 03/30/2023   Malingering 03/30/2023   Housing instability due to imminent risk of homelessness 03/03/2023   URI with cough and congestion 03/03/2023   MDD (major depressive disorder), recurrent severe, without psychosis (HCC) 01/18/2023   MDD (major depressive disorder), recurrent, severe, 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/06/2023   Seizure (HCC) 12/24/2021   Seizure disorder (HCC) 10/28/2021   Seizures (HCC) 07/31/2021   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   Suicidal ideations 07/08/2020   Amphetamine and psychostimulant-induced psychosis with delusions (HCC)    Amphetamine use disorder, severe (HCC)  03/23/2020   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

## 2023-05-06 NOTE — ED Notes (Signed)
Pt is in the dayroom watching TV with peers. Pt requested for sleeping pill to help with sleep.Pt denies SI/HI/AVH. No acute distress noted. Will continue to monitor for safety.

## 2023-05-06 NOTE — ED Provider Notes (Signed)
Behavioral Health Progress Note  Date and Time: 05/06/2023 12:42 PM Name: Justin [name] MRN:  [id number]  Subjective:  Justin [name] is a 42 y.o. male, with PMH of substance induced mood d/o, amphetamine induced psychotic d/o, amphetamine use d/o, cannabis use d/o, suicide attempt, inpatient psych admission, HIV, seizure (on keppra), who presented to [facility] (05/04/2023) accompanied, then admitted to [facility] for assistance with getting into residential rehab for substance use   Feels better than he did yesterday, he finally got some sleep last night. Mood is still depressed some, but has improved since being here. No issues with appetite. Denied rx side effects.  Denied active and passive SI, HI, AVH, paranoia.   Review of Systems  Constitutional:  Positive for malaise/fatigue.  Respiratory:  Negative for shortness of breath.   Cardiovascular:  Negative for chest pain.  Gastrointestinal:  Negative for nausea and vomiting.  Neurological:  Negative for dizziness and headaches.    Diagnosis:  Final diagnoses:  Substance induced mood disorder (HCC)  Cannabis use disorder  Human immunodeficiency virus (HIV) disease (HCC)  Amphetamine and psychostimulant-induced psychosis with delusions (HCC)  Amphetamine use disorder, severe (HCC)    Past Psychiatric History: See H&P Past Medical History: See H&P Family History: See H&P Family Psychiatric  History: See H&P Social History: See H&P  Total Time spent with patient: 30 minutes  Additional Social History:                         Current Medications:  Current Facility-Administered Medications  Medication Dose Route Frequency Provider Last Rate Last Admin   acetaminophen (TYLENOL) tablet 650 mg  650 mg Oral Q6H PRN Augusto Gamble, MD       alum & mag hydroxide-simeth (MAALOX/MYLANTA) 200-200-20 MG/5ML suspension 30 mL  30 mL Oral Q4H PRN Augusto Gamble, MD       bictegravir-emtricitabine-tenofovir AF (BIKTARVY) 50-200-25 MG per  tablet 1 tablet  1 tablet Oral Daily Lauree Chandler, NP   1 tablet at 05/06/23 0915   bismuth subsalicylate (PEPTO BISMOL) chewable tablet 524 mg  524 mg Oral Q3H PRN Augusto Gamble, MD       OLANZapine zydis (ZYPREXA) disintegrating tablet 5 mg  5 mg Oral Q6H PRN Augusto Gamble, MD       And   LORazepam (ATIVAN) tablet 1 mg  1 mg Oral Q6H PRN Augusto Gamble, MD       And   diphenhydrAMINE (BENADRYL) capsule 25 mg  25 mg Oral Q6H PRN Augusto Gamble, MD       haloperidol lactate (HALDOL) injection 5 mg  5 mg Intramuscular Q6H PRN Augusto Gamble, MD       And   LORazepam (ATIVAN) injection 1 mg  1 mg Intravenous Q6H PRN Augusto Gamble, MD       And   diphenhydrAMINE (BENADRYL) injection 25 mg  25 mg Intramuscular Q6H PRN Augusto Gamble, MD       escitalopram (LEXAPRO) tablet 20 mg  20 mg Oral Daily Lauree Chandler, NP   20 mg at 05/06/23 0915   hydrOXYzine (ATARAX) tablet 25 mg  25 mg Oral TID PRN Augusto Gamble, MD   25 mg at 05/05/23 1949   levETIRAcetam (KEPPRA) tablet 1,000 mg  1,000 mg Oral BID Lauree Chandler, NP   1,000 mg at 05/06/23 0915   nicotine (NICODERM CQ - dosed in mg/24 hours) patch 14 mg  14 mg Transdermal Daily Bobbitt, Shalon E,  NP   14 mg at 05/06/23 0915   nicotine polacrilex (NICORETTE) gum 2 mg  2 mg Oral PRN Augusto Gamble, MD       OLANZapine [facility]) tablet 10 mg  10 mg Oral Rosanne Sack, MD   10 mg at 05/05/23 2058   ondansetron (ZOFRAN) tablet 8 mg  8 mg Oral Q8H PRN Augusto Gamble, MD       polyethylene glycol (MIRALAX / GLYCOLAX) packet 17 g  17 g Oral Daily PRN Augusto Gamble, MD       senna (SENOKOT) tablet 8.6 mg  1 tablet Oral QHS PRN Augusto Gamble, MD       traZODone (DESYREL) tablet 50 mg  50 mg Oral QHS PRN Lauree Chandler, NP   50 mg at 05/05/23 2058   Current Outpatient Medications  Medication Sig Dispense Refill   acetaminophen (TYLENOL) 500 MG tablet Take 500-1,000 mg by mouth every 6 (six) hours as needed for mild pain (pain score 1-3), moderate pain (pain score  4-6) or headache.     bictegravir-emtricitabine-tenofovir AF (BIKTARVY) 50-200-25 MG TABS tablet Take 1 tablet by mouth daily. 30 tablet 0   escitalopram (LEXAPRO) 20 MG tablet Take 1 tablet (20 mg total) by mouth daily. 30 tablet 0   levETIRAcetam (KEPPRA) 1000 MG tablet Take 1 tablet (1,000 mg total) by mouth 2 (two) times daily. 60 tablet 0   OLANZapine (ZYPREXA) 10 MG tablet Take 1 tablet (10 mg total) by mouth at bedtime. 30 tablet 0    Labs  Lab Results:  Admission on 05/03/2023, Discharged on 05/04/2023  Component Date Value Ref Range Status   Alcohol, Ethyl (B) 05/04/2023 <10  <10 mg/dL Final   Comment: (NOTE) Lowest detectable limit for serum alcohol is 10 mg/dL.  For medical purposes only. Performed at [facility], [address] [place], [place] [address]   Admission on 05/02/2023, Discharged on 05/03/2023  Component Date Value Ref Range Status   WBC 05/02/2023 8.1  4.0 - 10.5 K/uL Final   RBC 05/02/2023 4.82  4.22 - 5.81 MIL/uL Final   Hemoglobin 05/02/2023 15.7  13.0 - 17.0 g/dL Final   HCT 62/13/0865 46.8  39.0 - 52.0 % Final   MCV 05/02/2023 97.1  80.0 - 100.0 fL Final   MCH 05/02/2023 32.6  26.0 - 34.0 pg Final   MCHC 05/02/2023 33.5  30.0 - 36.0 g/dL Final   RDW 78/46/9629 11.9  11.5 - 15.5 % Final   Platelets 05/02/2023 206  150 - 400 K/uL Final   nRBC 05/02/2023 0.0  0.0 - 0.2 % Final   Neutrophils Relative % 05/02/2023 71  % Final   Neutro Abs 05/02/2023 5.7  1.7 - 7.7 K/uL Final   Lymphocytes Relative 05/02/2023 23  % Final   Lymphs Abs 05/02/2023 1.8  0.7 - 4.0 K/uL Final   Monocytes Relative 05/02/2023 6  % Final   Monocytes Absolute 05/02/2023 0.5  0.1 - 1.0 K/uL Final   Eosinophils Relative 05/02/2023 0  % Final   Eosinophils Absolute 05/02/2023 0.0  0.0 - 0.5 K/uL Final   Basophils Relative 05/02/2023 0  % Final   Basophils Absolute 05/02/2023 0.0  0.0 - 0.1 K/uL Final   Immature Granulocytes 05/02/2023 0  % Final   Abs Immature Granulocytes  05/02/2023 0.03  0.00 - 0.07 K/uL Final   Performed at [facility], [address], [place], [place] [address]   Sodium 05/02/2023 139  135 -  145 mmol/L Final   Potassium 05/02/2023 3.6  3.5 - 5.1 mmol/L Final   Chloride 05/02/2023 103  98 - 111 mmol/L Final   CO2 05/02/2023 25  22 - 32 mmol/L Final   Glucose, Bld 05/02/2023 112 (H)  70 - 99 mg/dL Final   Glucose reference range applies only to samples taken after fasting for at least 8 hours.   BUN 05/02/2023 14  6 - 20 mg/dL Final   Creatinine, Ser 05/02/2023 0.95  0.61 - 1.24 mg/dL Final   Calcium 91/47/8295 9.1  8.9 - 10.3 mg/dL Final   Total Protein 62/13/0865 7.4  6.5 - 8.1 g/dL Final   Albumin 78/46/9629 4.2  3.5 - 5.0 g/dL Final   AST 52/84/1324 25  15 - 41 U/L Final   ALT 05/02/2023 27  0 - 44 U/L Final   Alkaline Phosphatase 05/02/2023 68  38 - 126 U/L Final   Total Bilirubin 05/02/2023 1.9 (H)  0.3 - 1.2 mg/dL Final   GFR, Estimated 05/02/2023 >60  >60 mL/min Final   Comment: (NOTE) Calculated using the CKD-EPI Creatinine Equation (2021)    Anion gap 05/02/2023 11  5 - 15 Final   Performed at [facility], [address], [place], [place] [address]   Lipase 05/02/2023 33  11 - 51 U/L Final   Performed at [facility], [address], [place], [place] [address]   Levetiracetam Lvl 05/02/2023 12.2  10.0 - 40.0 ug/mL Final   Comment: (NOTE) Performed At: [facility] [address] [place], [place] [id number] Jolene Schimke MD [id number]   Admission on 04/30/2023, Discharged on 05/02/2023  Component Date Value Ref Range Status   Sodium 04/30/2023 137  135 - 145 mmol/L Final   Potassium 04/30/2023 3.5  3.5 - 5.1 mmol/L Final   Chloride 04/30/2023 101  98 - 111 mmol/L Final   CO2 04/30/2023 20 (L)  22 - 32 mmol/L Final   Glucose, Bld 04/30/2023 88  70 - 99 mg/dL Final   Glucose reference range applies only to samples taken after fasting for at least 8  hours.   BUN 04/30/2023 26 (H)  6 - 20 mg/dL Final   Creatinine, Ser 04/30/2023 1.21  0.61 - 1.24 mg/dL Final   Calcium 29/51/8841 9.3  8.9 - 10.3 mg/dL Final   Total Protein 66/12/3014 7.8  6.5 - 8.1 g/dL Final   Albumin 07/12/3233 4.6  3.5 - 5.0 g/dL Final   AST 57/32/2025 53 (H)  15 - 41 U/L Final   ALT 04/30/2023 37  0 - 44 U/L Final   Alkaline Phosphatase 04/30/2023 81  38 - 126 U/L Final   Total Bilirubin 04/30/2023 2.8 (H)  0.3 - 1.2 mg/dL Final   GFR, Estimated 04/30/2023 >60  >60 mL/min Final   Comment: (NOTE) Calculated using the CKD-EPI Creatinine Equation (2021)    Anion gap 04/30/2023 16 (H)  5 - 15 Final   Performed at [facility], [address], [place], [place] [address]   WBC 04/30/2023 10.2  4.0 - 10.5 K/uL Final   RBC 04/30/2023 4.92  4.22 - 5.81 MIL/uL Final   Hemoglobin 04/30/2023 15.9  13.0 - 17.0 g/dL Final   HCT 23/76/2831 47.6  39.0 - 52.0 % Final   MCV 04/30/2023 96.7  80.0 - 100.0 fL Final   MCH 04/30/2023 32.3  26.0 - 34.0 pg Final   MCHC 04/30/2023 33.4  30.0 - 36.0 g/dL Final   RDW  04/30/2023 12.1  11.5 - 15.5 % Final   Platelets 04/30/2023 218  150 - 400 K/uL Final   nRBC 04/30/2023 0.0  0.0 - 0.2 % Final   Neutrophils Relative % 04/30/2023 64  % Final   Neutro Abs 04/30/2023 6.5  1.7 - 7.7 K/uL Final   Lymphocytes Relative 04/30/2023 25  % Final   Lymphs Abs 04/30/2023 2.5  0.7 - 4.0 K/uL Final   Monocytes Relative 04/30/2023 11  % Final   Monocytes Absolute 04/30/2023 1.1 (H)  0.1 - 1.0 K/uL Final   Eosinophils Relative 04/30/2023 0  % Final   Eosinophils Absolute 04/30/2023 0.0  0.0 - 0.5 K/uL Final   Basophils Relative 04/30/2023 0  % Final   Basophils Absolute 04/30/2023 0.0  0.0 - 0.1 K/uL Final   Immature Granulocytes 04/30/2023 0  % Final   Abs Immature Granulocytes 04/30/2023 0.03  0.00 - 0.07 K/uL Final   Performed at [facility], [address] [place], [place] [address]   Opiates 04/30/2023  NONE DETECTED  NONE DETECTED Final   Cocaine 04/30/2023 NONE DETECTED  NONE DETECTED Final   Benzodiazepines 04/30/2023 NONE DETECTED  NONE DETECTED Final   Amphetamines 04/30/2023 POSITIVE (A)  NONE DETECTED Final   Tetrahydrocannabinol 04/30/2023 NONE DETECTED  NONE DETECTED Final   Barbiturates 04/30/2023 NONE DETECTED  NONE DETECTED Final   Comment: (NOTE) DRUG SCREEN FOR MEDICAL PURPOSES ONLY.  IF CONFIRMATION IS NEEDED FOR ANY PURPOSE, NOTIFY LAB WITHIN 5 DAYS.  LOWEST DETECTABLE LIMITS FOR URINE DRUG SCREEN Drug Class                     Cutoff (ng/mL) Amphetamine and metabolites    1000 Barbiturate and metabolites    200 Benzodiazepine                 200 Opiates and metabolites        300 Cocaine and metabolites        300 THC                            50 Performed at [facility], [address] [place], [place] [address]    Color, Urine 04/30/2023 YELLOW  YELLOW Final   APPearance 04/30/2023 CLEAR  CLEAR Final   Specific Gravity, Urine 04/30/2023 1.027  1.005 - 1.030 Final   pH 04/30/2023 5.0  5.0 - 8.0 Final   Glucose, UA 04/30/2023 NEGATIVE  NEGATIVE mg/dL Final   Hgb urine dipstick 04/30/2023 NEGATIVE  NEGATIVE Final   Bilirubin Urine 04/30/2023 NEGATIVE  NEGATIVE Final   Ketones, ur 04/30/2023 20 (A)  NEGATIVE mg/dL Final   Protein, ur 82/95/6213 NEGATIVE  NEGATIVE mg/dL Final   Nitrite 08/65/7846 NEGATIVE  NEGATIVE Final   Leukocytes,Ua 04/30/2023 NEGATIVE  NEGATIVE Final   RBC / HPF 04/30/2023 0-5  0 - 5 RBC/hpf Final   WBC, UA 04/30/2023 0-5  0 - 5 WBC/hpf Final   Bacteria, UA 04/30/2023 RARE (A)  NONE SEEN Final   Squamous Epithelial / HPF 04/30/2023 0-5  0 - 5 /HPF Final   Mucus 04/30/2023 PRESENT   Final   Performed at [facility], [address], [place], [place] [address]  Office Visit on 04/25/2023  Component Date Value Ref Range Status   HIV 1 RNA Quant 04/25/2023 Not Detected  Copies/mL Final   HIV-1 RNA  Wilhemena Durie 04/25/2023 Not Detected  Log cps/mL Final   Comment: . Reference Range:                           Not Detected     copies/mL                           Not Detected Log copies/mL . [name] The test was performed using Real-Time Polymerase Chain Reaction. . . Reportable Range: 20 copies/mL to 10,000,000 copies/mL (1.30 Log copies/mL to 7.00 Log copies/mL). .    CD4 T Cell Abs 04/25/2023 537  400 - 1,790 /uL Final   CD4 % Helper T Cell 04/25/2023 31 (L)  33 - 65 % Final   Performed at [facility], [address], [place], [place] [address]   RPR Ser Ql 04/25/2023 NON-REACTIVE  NON-REACTIVE Final   Comment: . No laboratory evidence of syphilis. If recent exposure is suspected, submit a new sample in 2-4 weeks. .   Admission on 04/07/2023, Discharged on 04/08/2023  Component Date Value Ref Range Status   Glucose-Capillary 04/07/2023 126 (H)  70 - 99 mg/dL Final   Glucose reference range applies only to samples taken after fasting for at least 8 hours.   Sodium 04/07/2023 142  135 - 145 mmol/L Final   Potassium 04/07/2023 3.9  3.5 - 5.1 mmol/L Final   Chloride 04/07/2023 104  98 - 111 mmol/L Final   CO2 04/07/2023 26  22 - 32 mmol/L Final   Glucose, Bld 04/07/2023 92  70 - 99 mg/dL Final   Glucose reference range applies only to samples taken after fasting for at least 8 hours.   BUN 04/07/2023 10  6 - 20 mg/dL Final   Creatinine, Ser 04/07/2023 0.97  0.61 - 1.24 mg/dL Final   Calcium 60/45/4098 9.6  8.9 - 10.3 mg/dL Final   Total Protein 11/91/4782 8.0  6.5 - 8.1 g/dL Final   Albumin 95/62/1308 4.4  3.5 - 5.0 g/dL Final   AST 65/78/4696 25  15 - 41 U/L Final   ALT 04/07/2023 33  0 - 44 U/L Final   Alkaline Phosphatase 04/07/2023 85  38 - 126 U/L Final   Total Bilirubin 04/07/2023 1.9 (H)  0.3 - 1.2 mg/dL Final   GFR, Estimated 04/07/2023 >60  >60 mL/min Final   Comment: (NOTE) Calculated using the CKD-EPI Creatinine Equation (2021)    Anion gap  04/07/2023 12  5 - 15 Final   Performed at [facility], [address] [place], [place] [address]   Alcohol, Ethyl (B) 04/07/2023 <10  <10 mg/dL Final   Comment: (NOTE) Lowest detectable limit for serum alcohol is 10 mg/dL.  For medical purposes only. Performed at [facility], [address] [place], [place] [address]    Salicylate Lvl 04/07/2023 <7.0 (L)  7.0 - 30.0 mg/dL Final   Performed at [facility], [address] [place], [place] [address]   Acetaminophen (Tylenol), Serum 04/07/2023 <10 (L)  10 - 30 ug/mL Final   Comment: (NOTE) Therapeutic concentrations vary significantly. A range of 10-30 ug/mL  may be an effective concentration for many patients. However, some  are best treated at concentrations outside of this range. Acetaminophen concentrations >150 ug/mL at 4 hours after ingestion  and >50 ug/mL at 12 hours after ingestion are often associated with  toxic reactions.  Performed at [facility], [address] [place], [place] [address]    WBC 04/07/2023 8.7  4.0 - 10.5 K/uL Final   RBC 04/07/2023 5.01  4.22 - 5.81 MIL/uL Final   Hemoglobin 04/07/2023 16.5  13.0 - 17.0 g/dL Final   HCT 60/45/4098 50.2  39.0 - 52.0 % Final   MCV 04/07/2023 100.2 (H)  80.0 - 100.0 fL Final   MCH 04/07/2023 32.9  26.0 - 34.0 pg Final   MCHC 04/07/2023 32.9  30.0 - 36.0 g/dL Final   RDW 11/91/4782 12.3  11.5 - 15.5 % Final   Platelets 04/07/2023 229  150 - 400 K/uL Final   nRBC 04/07/2023 0.0  0.0 - 0.2 % Final   Performed at [facility], [address] [place], [place] [address]   Opiates 04/07/2023 NONE DETECTED  NONE DETECTED Final   Cocaine 04/07/2023 NONE DETECTED  NONE DETECTED Final   Benzodiazepines 04/07/2023 NONE DETECTED  NONE DETECTED Final   Amphetamines 04/07/2023 NONE DETECTED  NONE DETECTED Final   Tetrahydrocannabinol 04/07/2023 POSITIVE (A)  NONE DETECTED Final    Barbiturates 04/07/2023 NONE DETECTED  NONE DETECTED Final   Comment: (NOTE) DRUG SCREEN FOR MEDICAL PURPOSES ONLY.  IF CONFIRMATION IS NEEDED FOR ANY PURPOSE, NOTIFY LAB WITHIN 5 DAYS.  LOWEST DETECTABLE LIMITS FOR URINE DRUG SCREEN Drug Class                     Cutoff (ng/mL) Amphetamine and metabolites    1000 Barbiturate and metabolites    200 Benzodiazepine                 200 Opiates and metabolites        300 Cocaine and metabolites        300 THC                            50 Performed at [facility], [address], [place], [place] [address]   Admission on 04/01/2023, Discharged on 04/02/2023  Component Date Value Ref Range Status   WBC 04/01/2023 7.2  4.0 - 10.5 K/uL Final   RBC 04/01/2023 4.80  4.22 - 5.81 MIL/uL Final   Hemoglobin 04/01/2023 15.2  13.0 - 17.0 g/dL Final   HCT 78/46/9629 47.4  39.0 - 52.0 % Final   MCV 04/01/2023 98.8  80.0 - 100.0 fL Final   MCH 04/01/2023 31.7  26.0 - 34.0 pg Final   MCHC 04/01/2023 32.1  30.0 - 36.0 g/dL Final   RDW 52/84/1324 11.9  11.5 - 15.5 % Final   Platelets 04/01/2023 220  150 - 400 K/uL Final   nRBC 04/01/2023 0.0  0.0 - 0.2 % Final   Neutrophils Relative % 04/01/2023 42  % Final   Neutro Abs 04/01/2023 3.0  1.7 - 7.7 K/uL Final   Lymphocytes Relative 04/01/2023 46  % Final   Lymphs Abs 04/01/2023 3.3  0.7 - 4.0 K/uL Final   Monocytes Relative 04/01/2023 9  % Final   Monocytes Absolute 04/01/2023 0.6  0.1 - 1.0 K/uL Final   Eosinophils Relative 04/01/2023 2  % Final   Eosinophils Absolute 04/01/2023 0.1  0.0 - 0.5 K/uL Final   Basophils Relative 04/01/2023 1  % Final   Basophils Absolute 04/01/2023 0.1  0.0 - 0.1 K/uL Final   Immature Granulocytes 04/01/2023 0  % Final   Abs Immature Granulocytes 04/01/2023 0.02  0.00 - 0.07 K/uL Final  Performed at [facility], [address], [place], [place] [address]   Sodium 04/01/2023 141  135 - 145 mmol/L Final   Potassium 04/01/2023 4.1  3.5 -  5.1 mmol/L Final   Chloride 04/01/2023 106  98 - 111 mmol/L Final   CO2 04/01/2023 25  22 - 32 mmol/L Final   Glucose, Bld 04/01/2023 100 (H)  70 - 99 mg/dL Final   Glucose reference range applies only to samples taken after fasting for at least 8 hours.   BUN 04/01/2023 9  6 - 20 mg/dL Final   Creatinine, Ser 04/01/2023 1.10  0.61 - 1.24 mg/dL Final   Calcium 19/14/7829 9.5  8.9 - 10.3 mg/dL Final   Total Protein 56/21/3086 6.5  6.5 - 8.1 g/dL Final   Albumin 57/84/6962 3.9  3.5 - 5.0 g/dL Final   AST 95/28/4132 34  15 - 41 U/L Final   ALT 04/01/2023 37  0 - 44 U/L Final   Alkaline Phosphatase 04/01/2023 73  38 - 126 U/L Final   Total Bilirubin 04/01/2023 1.0  0.3 - 1.2 mg/dL Final   GFR, Estimated 04/01/2023 >60  >60 mL/min Final   Comment: (NOTE) Calculated using the CKD-EPI Creatinine Equation (2021)    Anion gap 04/01/2023 10  5 - 15 Final   Performed at [facility], [address], [place], [place] [address]  Admission on 03/29/2023, Discharged on 03/30/2023  Component Date Value Ref Range Status   Sodium 03/29/2023 139  135 - 145 mmol/L Final   Potassium 03/29/2023 3.5  3.5 - 5.1 mmol/L Final   Chloride 03/29/2023 99  98 - 111 mmol/L Final   CO2 03/29/2023 28  22 - 32 mmol/L Final   Glucose, Bld 03/29/2023 97  70 - 99 mg/dL Final   Glucose reference range applies only to samples taken after fasting for at least 8 hours.   BUN 03/29/2023 8  6 - 20 mg/dL Final   Creatinine, Ser 03/29/2023 1.07  0.61 - 1.24 mg/dL Final   Calcium 27/25/3664 9.5  8.9 - 10.3 mg/dL Final   Total Protein 40/34/7425 7.1  6.5 - 8.1 g/dL Final   Albumin 95/63/8756 4.1  3.5 - 5.0 g/dL Final   AST 43/32/9518 37  15 - 41 U/L Final   ALT 03/29/2023 41  0 - 44 U/L Final   Alkaline Phosphatase 03/29/2023 66  38 - 126 U/L Final   Total Bilirubin 03/29/2023 2.0 (H)  0.3 - 1.2 mg/dL Final   GFR, Estimated 03/29/2023 >60  >60 mL/min Final   Comment: (NOTE) Calculated using the CKD-EPI Creatinine  Equation (2021)    Anion gap 03/29/2023 12  5 - 15 Final   Performed at [facility], [address], [place], [place] [address]   Alcohol, Ethyl (B) 03/29/2023 <10  <10 mg/dL Final   Comment: (NOTE) Lowest detectable limit for serum alcohol is 10 mg/dL.  For medical purposes only. Performed at [facility], [address] [place], [place] [address]    Salicylate Lvl 03/29/2023 <7.0 (L)  7.0 - 30.0 mg/dL Final   Performed at [facility], [address] [place], [place] [address]   Acetaminophen (Tylenol), Serum 03/29/2023 <10 (L)  10 - 30 ug/mL Final   Comment: (NOTE) Therapeutic concentrations vary significantly. A range of 10-30 ug/mL  may be an effective concentration for many patients. However, some  are best treated at concentrations outside of this range. Acetaminophen concentrations >  150 ug/mL at 4 hours after ingestion  and >50 ug/mL at 12 hours after ingestion are often associated with  toxic reactions.  Performed at [facility], [address] [place], [place] [address]    WBC 03/29/2023 5.6  4.0 - 10.5 K/uL Final   RBC 03/29/2023 4.92  4.22 - 5.81 MIL/uL Final   Hemoglobin 03/29/2023 15.5  13.0 - 17.0 g/dL Final   HCT 62/13/0865 46.6  39.0 - 52.0 % Final   MCV 03/29/2023 94.7  80.0 - 100.0 fL Final   MCH 03/29/2023 31.5  26.0 - 34.0 pg Final   MCHC 03/29/2023 33.3  30.0 - 36.0 g/dL Final   RDW 78/46/9629 12.0  11.5 - 15.5 % Final   Platelets 03/29/2023 231  150 - 400 K/uL Final   nRBC 03/29/2023 0.0  0.0 - 0.2 % Final   Performed at [facility], [address], [place], [place] [address]   Opiates 03/29/2023 NONE DETECTED  NONE DETECTED Final   Cocaine 03/29/2023 NONE DETECTED  NONE DETECTED Final   Benzodiazepines 03/29/2023 NONE DETECTED  NONE DETECTED Final   Amphetamines 03/29/2023 NONE DETECTED  NONE DETECTED Final   Tetrahydrocannabinol 03/29/2023 POSITIVE (A)  NONE DETECTED Final   Barbiturates 03/29/2023 NONE DETECTED   NONE DETECTED Final   Comment: (NOTE) DRUG SCREEN FOR MEDICAL PURPOSES ONLY.  IF CONFIRMATION IS NEEDED FOR ANY PURPOSE, NOTIFY LAB WITHIN 5 DAYS.  LOWEST DETECTABLE LIMITS FOR URINE DRUG SCREEN Drug Class                     Cutoff (ng/mL) Amphetamine and metabolites    1000 Barbiturate and metabolites    200 Benzodiazepine                 200 Opiates and metabolites        300 Cocaine and metabolites        300 THC                            50 Performed at [facility], [address], [place], [place] [address]    Glucose-Capillary 03/29/2023 150 (H)  70 - 99 mg/dL Final   Glucose reference range applies only to samples taken after fasting for at least 8 hours.  Admission on 03/26/2023, Discharged on 03/26/2023  Component Date Value Ref Range Status   Glucose-Capillary 03/26/2023 86  70 - 99 mg/dL Final   Glucose reference range applies only to samples taken after fasting for at least 8 hours.  Admission on 03/26/2023, Discharged on 03/26/2023  Component Date Value Ref Range Status   Sodium 03/26/2023 135  135 - 145 mmol/L Final   Potassium 03/26/2023 3.0 (L)  3.5 - 5.1 mmol/L Final   Chloride 03/26/2023 100  98 - 111 mmol/L Final   CO2 03/26/2023 19 (L)  22 - 32 mmol/L Final   Glucose, Bld 03/26/2023 90  70 - 99 mg/dL Final   Glucose reference range applies only to samples taken after fasting for at least 8 hours.   BUN 03/26/2023 18  6 - 20 mg/dL Final   Creatinine, Ser 03/26/2023 1.26 (H)  0.61 - 1.24 mg/dL Final   Calcium 04/30/2535 9.0  8.9 - 10.3 mg/dL Final   GFR, Estimated 03/26/2023 >60  >60 mL/min Final   Comment: (NOTE) Calculated using the CKD-EPI Creatinine Equation (2021)    Anion gap 03/26/2023 16 (H)  5 - 15 Final   Performed at [facility], [address], [place], [place] [address]   WBC 03/26/2023 7.4  4.0 - 10.5 K/uL Final   RBC 03/26/2023 5.06  4.22 - 5.81 MIL/uL Final   Hemoglobin 03/26/2023 16.0  13.0 - 17.0 g/dL Final   HCT  56/21/3086 46.8  39.0 - 52.0 % Final   MCV 03/26/2023 92.5  80.0 - 100.0 fL Final   MCH 03/26/2023 31.6  26.0 - 34.0 pg Final   MCHC 03/26/2023 34.2  30.0 - 36.0 g/dL Final   RDW 57/84/6962 12.2  11.5 - 15.5 % Final   Platelets 03/26/2023 233  150 - 400 K/uL Final   nRBC 03/26/2023 0.0  0.0 - 0.2 % Final   Neutrophils Relative % 03/26/2023 49  % Final   Neutro Abs 03/26/2023 3.6  1.7 - 7.7 K/uL Final   Lymphocytes Relative 03/26/2023 37  % Final   Lymphs Abs 03/26/2023 2.8  0.7 - 4.0 K/uL Final   Monocytes Relative 03/26/2023 11  % Final   Monocytes Absolute 03/26/2023 0.8  0.1 - 1.0 K/uL Final   Eosinophils Relative 03/26/2023 2  % Final   Eosinophils Absolute 03/26/2023 0.1  0.0 - 0.5 K/uL Final   Basophils Relative 03/26/2023 1  % Final   Basophils Absolute 03/26/2023 0.0  0.0 - 0.1 K/uL Final   Immature Granulocytes 03/26/2023 0  % Final   Abs Immature Granulocytes 03/26/2023 0.02  0.00 - 0.07 K/uL Final   Performed at [facility], [address], [place], [place] [address]   Magnesium 03/26/2023 1.8  1.7 - 2.4 mg/dL Final   Performed at [facility], [address] [place], [place] [address]  Admission on 03/23/2023, Discharged on 03/23/2023  Component Date Value Ref Range Status   Glucose-Capillary 03/23/2023 128 (H)  70 - 99 mg/dL Final   Glucose reference range applies only to samples taken after fasting for at least 8 hours.   Sodium 03/23/2023 142  135 - 145 mmol/L Final   Potassium 03/23/2023 3.3 (L)  3.5 - 5.1 mmol/L Final   Chloride 03/23/2023 107  98 - 111 mmol/L Final   CO2 03/23/2023 25  22 - 32 mmol/L Final   Glucose, Bld 03/23/2023 108 (H)  70 - 99 mg/dL Final   Glucose reference range applies only to samples taken after fasting for at least 8 hours.   BUN 03/23/2023 13  6 - 20 mg/dL Final   Creatinine, Ser 03/23/2023 1.00  0.61 - 1.24 mg/dL Final   Calcium 07/06/7251 9.0  8.9 - 10.3 mg/dL Final   Total Protein 66/44/0347 7.4  6.5 - 8.1 g/dL Final    Albumin 42/59/5638 4.1  3.5 - 5.0 g/dL Final   AST 75/64/3329 34  15 - 41 U/L Final   ALT 03/23/2023 36  0 - 44 U/L Final   Alkaline Phosphatase 03/23/2023 63  38 - 126 U/L Final   Total Bilirubin 03/23/2023 1.8 (H)  0.3 - 1.2 mg/dL Final   GFR, Estimated 03/23/2023 >60  >60 mL/min Final   Comment: (NOTE) Calculated using the CKD-EPI Creatinine Equation (2021)    Anion gap 03/23/2023 10  5 - 15 Final   Performed at [facility], [address] [place], [place] [address]   WBC 03/23/2023 5.7  4.0 - 10.5 K/uL Final   RBC 03/23/2023 4.69  4.22 - 5.81 MIL/uL Final   Hemoglobin 03/23/2023 15.1  13.0 -  17.0 g/dL Final   HCT 16/04/9603 45.4  39.0 - 52.0 % Final   MCV 03/23/2023 96.8  80.0 - 100.0 fL Final   MCH 03/23/2023 32.2  26.0 - 34.0 pg Final   MCHC 03/23/2023 33.3  30.0 - 36.0 g/dL Final   RDW 54/03/8118 12.6  11.5 - 15.5 % Final   Platelets 03/23/2023 208  150 - 400 K/uL Final   nRBC 03/23/2023 0.0  0.0 - 0.2 % Final   Neutrophils Relative % 03/23/2023 62  % Final   Neutro Abs 03/23/2023 3.6  1.7 - 7.7 K/uL Final   Lymphocytes Relative 03/23/2023 25  % Final   Lymphs Abs 03/23/2023 1.5  0.7 - 4.0 K/uL Final   Monocytes Relative 03/23/2023 11  % Final   Monocytes Absolute 03/23/2023 0.6  0.1 - 1.0 K/uL Final   Eosinophils Relative 03/23/2023 1  % Final   Eosinophils Absolute 03/23/2023 0.0  0.0 - 0.5 K/uL Final   Basophils Relative 03/23/2023 1  % Final   Basophils Absolute 03/23/2023 0.0  0.0 - 0.1 K/uL Final   Immature Granulocytes 03/23/2023 0  % Final   Abs Immature Granulocytes 03/23/2023 0.01  0.00 - 0.07 K/uL Final   Performed at [facility], [address], [place], [place] [address]   Magnesium 03/23/2023 2.2  1.7 - 2.4 mg/dL Final   Performed at [facility], [address] [place], [place] [address]   Specimen Source 03/23/2023 URINE, CLEAN CATCH   Final   Color, Urine 03/23/2023 AMBER (A)  YELLOW Final    BIOCHEMICALS MAY BE AFFECTED BY COLOR   APPearance 03/23/2023 CLEAR  CLEAR Final   Specific Gravity, Urine 03/23/2023 1.030  1.005 - 1.030 Final   pH 03/23/2023 6.0  5.0 - 8.0 Final   Glucose, UA 03/23/2023 NEGATIVE  NEGATIVE mg/dL Final   Hgb urine dipstick 03/23/2023 NEGATIVE  NEGATIVE Final   Bilirubin Urine 03/23/2023 NEGATIVE  NEGATIVE Final   Ketones, ur 03/23/2023 5 (A)  NEGATIVE mg/dL Final   Protein, ur 30/86/5784 NEGATIVE  NEGATIVE mg/dL Final   Nitrite 69/62/9528 NEGATIVE  NEGATIVE Final   Leukocytes,Ua 03/23/2023 NEGATIVE  NEGATIVE Final   RBC / HPF 03/23/2023 0-5  0 - 5 RBC/hpf Final   WBC, UA 03/23/2023 0-5  0 - 5 WBC/hpf Final   Comment:        Reflex urine culture not performed if WBC <=10, OR if Squamous epithelial cells >5. If Squamous epithelial cells >5 suggest recollection.    Bacteria, UA 03/23/2023 NONE SEEN  NONE SEEN Final   Squamous Epithelial / HPF 03/23/2023 0-5  0 - 5 /HPF Final   Mucus 03/23/2023 PRESENT   Final   Performed at [facility], [address] [place], [place] [address]   Opiates 03/23/2023 NONE DETECTED  NONE DETECTED Final   Cocaine 03/23/2023 NONE DETECTED  NONE DETECTED Final   Benzodiazepines 03/23/2023 NONE DETECTED  NONE DETECTED Final   Amphetamines 03/23/2023 NONE DETECTED  NONE DETECTED Final   Tetrahydrocannabinol 03/23/2023 NONE DETECTED  NONE DETECTED Final   Barbiturates 03/23/2023 NONE DETECTED  NONE DETECTED Final   Comment: (NOTE) DRUG SCREEN FOR MEDICAL PURPOSES ONLY.  IF CONFIRMATION IS NEEDED FOR ANY PURPOSE, NOTIFY LAB WITHIN 5 DAYS.  LOWEST DETECTABLE LIMITS FOR URINE DRUG SCREEN Drug Class                     Cutoff (ng/mL) Amphetamine and metabolites  1000 Barbiturate and metabolites    200 Benzodiazepine                 200 Opiates and metabolites        300 Cocaine and metabolites        300 THC                            50 Performed at [facility], [address], [place], [place] [address]    Alcohol, Ethyl (B) 03/23/2023 <10  <10 mg/dL Final   Comment: (NOTE) Lowest detectable limit for serum alcohol is 10 mg/dL.  For medical purposes only. Performed at [facility], [address] [place], [place] [address]    Levetiracetam Lvl 03/23/2023 25.9  10.0 - 40.0 ug/mL Final   Comment: (NOTE) Performed At: [facility] [address] [place] , [place] [id number] Jolene Schimke MD WG:[id number]   There may be more visits with results that are not included.    Blood Alcohol level:  Lab Results  Component Value Date   ETH <10 05/04/2023   ETH <10 04/07/2023    Metabolic Disorder Labs: Lab Results  Component Value Date   HGBA1C 4.7 (L) 04/18/2020   MPG 88 04/18/2020   No results found for: "PROLACTIN" Lab Results  Component Value Date   CHOL 173 03/03/2023   TRIG 103 03/03/2023   HDL 44 03/03/2023   CHOLHDL 3.9 03/03/2023   VLDL 8 04/18/2020   LDLCALC 109 (H) 03/03/2023   LDLCALC 97 08/03/2022    Therapeutic Lab Levels: No results found for: "LITHIUM" No results found for: "VALPROATE" No results found for: "CBMZ"  Physical Findings   PHQ2-9    Flowsheet Row ED from [facility]/04/2023 in [facility] Office Visit from 04/25/2023 in [facility]ious [facility] Office Visit from 08/17/2022 in [facility] for Infectious Disease Office Visit from 06/30/2021 in [facility] for Infectious Disease ED from 04/04/2021 in [facility] Emergency Department at [facility]  PHQ-2 Total Score 4 1 1 2 1   PHQ-9 Total Score 23 -- -- -- 3      Flowsheet Row ED from 05/04/2023 in [facility] ED from 05/03/2023 in [facility] ED from 05/02/2023 in [facility] Emergency Department at [facility]  C-SSRS RISK CATEGORY High Risk High Risk Error: Question 6 not  populated        Musculoskeletal  Strength & Muscle Tone: within normal limited Gait & Station: laying down Patient leans: laying down  Psychiatric Specialty Exam  Presentation  General Appearance:Appropriate for Environment, Casual Eye Contact:Minimal Speech:Clear and Coherent, Normal Rate Volume:Decreased Handedness:Right  Mood and Affect  Mood:Depressed Affect:Appropriate, Congruent, Restricted  Thought Process  Thought Process:Coherent, Goal Directed, Linear Descriptions of Associations:Intact  Thought Content Suicidal Thoughts:No Homicidal Thoughts:No Hallucinations:None Ideas of Reference:None Thought Content:Rumination  Sensorium  Memory:Immediate Good Judgment:Fair Insight:Poor  Executive Functions  Orientation:Full (Time, Place and Person) Language:Good Concentration:Good Attention:Good Recall:Good Fund of Knowledge:Good  Psychomotor Activity  Psychomotor Activity:Psychomotor Activity: Decreased, Psychomotor Retardation  Assets  Assets:Communication Skills, Desire for Improvement, Resilience  Sleep  Quality:Good  Physical Exam  Physical Exam Vitals and nursing note reviewed.  HENT:     Head: Normocephalic and atraumatic.  Pulmonary:     Effort: Pulmonary effort is normal. No respiratory distress.  Neurological:     Mental  Status: He is alert and oriented to person, place, and time.    Blood pressure 110/79, pulse 76, temperature 97.6 F (36.4 C), temperature source Oral, resp. rate 18, SpO2 100%. There is no height or weight on file to calculate BMI.  Treatment Plan Summary: Daily contact with patient to assess and evaluate symptoms and progress in treatment and Medication management  Tired from coming off of stimulants. Depression improved, no longer having SI.   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 Encouraged cessation Comfort PRNs   Tobacco use d/o NRTs Encouraged cessation   Seizure Keppra level therapeutic  Continued home keppra 1000 mg BID  HIV Continued home biktarvy  Dispo:  Residential rehab   Princess Bruins, DO Psych Resident, PGY-3 05/06/2023 12:42 PM

## 2023-05-06 NOTE — Group Note (Signed)
Group Topic: Relapse and Recovery  Group Date: 05/06/2023 Start Time: 2000 End Time: 2100 Facilitators: Guss Bunde  Department: [facility]  Number of Participants: 5  Group Focus: co-dependency Treatment Modality:  Patient-Centered Therapy Interventions utilized were patient education Purpose: relapse prevention strategies  Name: Justin [name] Date of Birth: [birth year]  MR: [id number]    Level of Participation: active Quality of Participation: attentive Interactions with others: gave feedback Mood/Affect: appropriate Triggers (if applicable):  Cognition: goal directed Progress: Gaining insight Response: Outside AA group performed Plan: follow-up needed  Patients Problems:  Patient Active Problem List   Diagnosis Date Noted   MDD (major depressive disorder) 05/04/2023   Severe episode of recurrent major depressive disorder, with psychotic features (HCC) 05/03/2023   Suicidal thoughts 05/03/2023   Homicidal ideation 04/07/2023   Cannabis abuse 04/07/2023   Suicidal ideation 04/07/2023   Homelessness 03/30/2023   Cannabis use disorder, severe, dependence (HCC) 03/30/2023   Malingering 03/30/2023   Housing instability due to imminent risk of homelessness 03/03/2023   URI with cough and congestion 03/03/2023   MDD (major depressive disorder), recurrent severe, without psychosis (HCC) 01/18/2023   MDD (major depressive disorder), recurrent, severe, 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/06/2023   Seizure (HCC) 12/24/2021   Seizure disorder (HCC) 10/28/2021   Seizures (HCC) 07/31/2021   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   Suicidal ideations 07/08/2020   Amphetamine and psychostimulant-induced psychosis with delusions (HCC)    Amphetamine use disorder, 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

## 2023-05-06 NOTE — ED Notes (Signed)
Patient is sleeping. Respirations equal and unlabored, skin warm and dry. No change in assessment or acuity. Routine safety checks conducted according to facility protocol. Will continue to monitor for safety.   

## 2023-05-06 NOTE — Group Note (Unsigned)
Group Topic: Recovery Basics  Group Date: 05/06/2023 Start Time: 1215 End Time: 1240 Facilitators: Jenean Lindau, RN  Department: [facility]  Number of Participants: 6  Group Focus: chemical dependency education Treatment Modality:  Cognitive Behavioral Therapy Interventions utilized were patient education Purpose: enhance coping skills, express feelings, increase insight, regain self-worth, reinforce self-care, and relapse prevention strategies   Name: Justin [name] Date of Birth: 1981/01/15  MR: [id number]    Level of Participation: {THERAPIES; PSYCH GROUP PARTICIPATION QMVHQ:46962} Quality of Participation: {THERAPIES; PSYCH QUALITY OF PARTICIPATION:23992} Interactions with others: {THERAPIES; PSYCH INTERACTIONS:23993} Mood/Affect: {THERAPIES; PSYCH MOOD/AFFECT:23994} Triggers (if applicable): *** Cognition: {THERAPIES; PSYCH COGNITION:23995} Progress: {THERAPIES; PSYCH PROGRESS:23997} Response: *** Plan: {THERAPIES; PSYCH XBMW:41324}  Patients Problems:  Patient Active Problem List   Diagnosis Date Noted   MDD (major depressive disorder) 05/04/2023   Severe episode of recurrent major depressive disorder, with psychotic features (HCC) 05/03/2023   Suicidal thoughts 05/03/2023   Homicidal ideation 04/07/2023   Cannabis abuse 04/07/2023   Suicidal ideation 04/07/2023   Homelessness 03/30/2023   Cannabis use disorder, severe, dependence (HCC) 03/30/2023   Malingering 03/30/2023   Housing instability due to imminent risk of homelessness 03/03/2023   URI with cough and congestion 03/03/2023   MDD (major depressive disorder), recurrent severe, without psychosis (HCC) 01/18/2023   MDD (major depressive disorder), recurrent, severe, 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/06/2023   Seizure (HCC) 12/24/2021   Seizure disorder (HCC) 10/28/2021   Seizures (HCC) 07/31/2021   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   Suicidal ideations 07/08/2020    Amphetamine and psychostimulant-induced psychosis with delusions (HCC)    Amphetamine use disorder, 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

## 2023-05-06 NOTE — ED Notes (Signed)
Patient remains asleep in bed without issue or complaint.  No distress or withdrawal.  Patient is pleasant, organized and makes needs known to staff.  Denies avh shi or plan.  Will monitor.

## 2023-05-06 NOTE — ED Notes (Signed)
Patient is presently awake and sitting in the dayroom eating breakfast and watching tv. Patient is without distress or complaint.  He makes needs known and is pleasant and appropriate.  No evidence of withdrawal.  Patient denies avh shi or plan.  Will monitor.

## 2023-05-07 DIAGNOSIS — F15250 Other stimulant dependence with stimulant-induced psychotic disorder with delusions: Secondary | ICD-10-CM | POA: Diagnosis not present

## 2023-05-07 DIAGNOSIS — Z21 Asymptomatic human immunodeficiency virus [HIV] infection status: Secondary | ICD-10-CM | POA: Diagnosis not present

## 2023-05-07 DIAGNOSIS — F419 Anxiety disorder, unspecified: Secondary | ICD-10-CM | POA: Diagnosis not present

## 2023-05-07 DIAGNOSIS — F329 Major depressive disorder, single episode, unspecified: Secondary | ICD-10-CM | POA: Diagnosis not present

## 2023-05-07 DIAGNOSIS — F1290 Cannabis use, unspecified, uncomplicated: Secondary | ICD-10-CM

## 2023-05-07 MED ORDER — OLANZAPINE 7.5 MG PO TABS
7.5000 mg | ORAL_TABLET | Freq: Every day | ORAL | Status: DC
  Administered 2023-05-07: 7.5 mg via ORAL
  Filled 2023-05-07: qty 7
  Filled 2023-05-07: qty 1

## 2023-05-07 MED ORDER — ESCITALOPRAM OXALATE 20 MG PO TABS: 20.0000 mg | ORAL_TABLET | Freq: Every day | ORAL | 0 refills | Status: DC

## 2023-05-07 MED ORDER — OLANZAPINE 7.5 MG PO TABS: 7.5000 mg | ORAL_TABLET | Freq: Every day | ORAL | 0 refills | Status: DC

## 2023-05-07 MED ORDER — BIKTARVY 50-200-25 MG PO TABS: 1.0000 | ORAL_TABLET | Freq: Every day | ORAL | 0 refills | Status: DC

## 2023-05-07 MED ORDER — LEVETIRACETAM 1000 MG PO TABS: 1000.0000 mg | ORAL_TABLET | Freq: Two times a day (BID) | ORAL | 0 refills | Status: DC

## 2023-05-07 NOTE — ED Notes (Signed)
Pt presents with pleasant mood, affect congruent. Cowboy reports he has no new concerns today and states '' yeah I'm leaving for [employer] tomorrow to go to [employer]. '' He denies any SI HI or AV Hallucinations. No signs of acute decompensation. He reports eating and drinking well, no physical complaints and he reports sleeping well last night. He has been compliant with am medications. Will con't to monitor.

## 2023-05-07 NOTE — Group Note (Signed)
Group Topic: Fears and Unhealthy Coping Skills  Group Date: 05/07/2023 Start Time: 2012 End Time: 2045 Facilitators: Vassie Loll, RN  Department: [facility]  Number of Participants: 7  Group Focus: coping skills Treatment Modality:  Psychoeducation Interventions utilized were support Purpose: enhance coping skills  Name: Justin [name] Date of Birth: 1981/03/20  MR: [id number]    Level of Participation: moderate Quality of Participation: cooperative Interactions with others: gave feedback Mood/Affect: appropriate Triggers (if applicable):   Cognition: coherent/clear Progress: Moderate Response:   Plan: follow-up needed  Patients Problems:  Patient Active Problem List   Diagnosis Date Noted   Cannabis use disorder 05/07/2023   MDD (major depressive disorder) 05/04/2023   Severe episode of recurrent major depressive disorder, with psychotic features (HCC) 05/03/2023   Suicidal thoughts 05/03/2023   Homicidal ideation 04/07/2023   Cannabis abuse 04/07/2023   Suicidal ideation 04/07/2023   Homelessness 03/30/2023   Cannabis use disorder, severe, dependence (HCC) 03/30/2023   Malingering 03/30/2023   Housing instability due to imminent risk of homelessness 03/03/2023   URI with cough and congestion 03/03/2023   MDD (major depressive disorder), recurrent severe, without psychosis (HCC) 01/18/2023   MDD (major depressive disorder), recurrent, severe, 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/06/2023   Seizure (HCC) 12/24/2021   Seizure disorder (HCC) 10/28/2021   Seizures (HCC) 07/31/2021   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   Suicidal ideations 07/08/2020   Amphetamine and psychostimulant-induced psychosis with delusions (HCC)    Amphetamine use disorder, 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

## 2023-05-07 NOTE — ED Notes (Signed)
Patient in the Dayroom watching TV with other patients. NAD. Denies SI/HI/AVH. Respirations are even and unlabored. Will continue to monitor for safety.

## 2023-05-07 NOTE — [facility] Group Notes (Signed)
PsychoEducational Group Group Topic: Positive Reframing and the impact of negative thoughts on mood. Patients were given education on the impact of negative thoughts, and then asked to share one thought they felt was negative that was impacting their mood, that they would like to let go of.  The patient attended and was appropriate.

## 2023-05-07 NOTE — ED Notes (Signed)
Patient is sleeping. Respirations equal and unlabored, skin warm and dry. No change in assessment or acuity. Routine safety checks conducted according to facility protocol. Will continue to monitor for safety.   

## 2023-05-07 NOTE — ED Provider Notes (Signed)
Behavioral Health Progress Note  Date and Time: 05/07/2023 3:37 PM Name: Justin [name] MRN:  [id number]  Subjective:  Justin [name] is a 42 y.o. male, with PMH of substance induced mood d/o, amphetamine induced psychotic d/o, amphetamine use d/o, cannabis use d/o, suicide attempt, inpatient psych admission, HIV, seizure (on keppra), who presented to [facility] (05/04/2023) accompanied, then admitted to [facility] for assistance with getting into residential rehab for substance use   Mood and energy continues to improve, still some depression that continues to improve. Denied issues with sleep or appetite.  He feels nervous/ready to go to residential tomorrow. His friend will be stopping by to bring him his home meds and clothes.   Denied active and passive SI, HI, AVH, paranoia.   Review of Systems  Constitutional:  Positive for malaise/fatigue.  Respiratory:  Negative for shortness of breath.   Cardiovascular:  Negative for chest pain.  Gastrointestinal:  Negative for nausea and vomiting.  Neurological:  Negative for dizziness and headaches.    Diagnosis:  Final diagnoses:  Substance induced mood disorder (HCC)  Cannabis use disorder  Human immunodeficiency virus (HIV) disease (HCC)  Amphetamine and psychostimulant-induced psychosis with delusions (HCC)  Amphetamine use disorder, severe (HCC)    Past Psychiatric History: See H&P Past Medical History: See H&P Family History: See H&P Family Psychiatric  History: See H&P Social History: See H&P  Total Time spent with patient: 30 minutes  Additional Social History:                         Current Medications:  Current Facility-Administered Medications  Medication Dose Route Frequency Provider Last Rate Last Admin   acetaminophen (TYLENOL) tablet 650 mg  650 mg Oral Q6H PRN Augusto Gamble, MD       alum & mag hydroxide-simeth (MAALOX/MYLANTA) 200-200-20 MG/5ML suspension 30 mL  30 mL Oral Q4H PRN Augusto Gamble, MD        bictegravir-emtricitabine-tenofovir AF (BIKTARVY) 50-200-25 MG per tablet 1 tablet  1 tablet Oral Daily Lauree Chandler, NP   1 tablet at 05/07/23 0846   bismuth subsalicylate (PEPTO BISMOL) chewable tablet 524 mg  524 mg Oral Q3H PRN Augusto Gamble, MD       OLANZapine zydis (ZYPREXA) disintegrating tablet 5 mg  5 mg Oral Q6H PRN Augusto Gamble, MD       And   LORazepam (ATIVAN) tablet 1 mg  1 mg Oral Q6H PRN Augusto Gamble, MD       And   diphenhydrAMINE (BENADRYL) capsule 25 mg  25 mg Oral Q6H PRN Augusto Gamble, MD       haloperidol lactate (HALDOL) injection 5 mg  5 mg Intramuscular Q6H PRN Augusto Gamble, MD       And   LORazepam (ATIVAN) injection 1 mg  1 mg Intravenous Q6H PRN Augusto Gamble, MD       And   diphenhydrAMINE (BENADRYL) injection 25 mg  25 mg Intramuscular Q6H PRN Augusto Gamble, MD       escitalopram (LEXAPRO) tablet 20 mg  20 mg Oral Daily Lauree Chandler, NP   20 mg at 05/07/23 0846   hydrOXYzine (ATARAX) tablet 25 mg  25 mg Oral TID PRN Augusto Gamble, MD   25 mg at 05/06/23 1833   levETIRAcetam (KEPPRA) tablet 1,000 mg  1,000 mg Oral BID Lauree Chandler, NP   1,000 mg at 05/07/23 0846   nicotine (NICODERM CQ - dosed in mg/24 hours)  patch 14 mg  14 mg Transdermal Daily Bobbitt, Shalon E, NP   14 mg at 05/07/23 0845   nicotine polacrilex (NICORETTE) gum 2 mg  2 mg Oral PRN Augusto Gamble, MD   2 mg at 05/07/23 1410   OLANZapine (ZYPREXA) tablet 7.5 mg  7.5 mg Oral QHS Princess Bruins, DO       ondansetron [facility]) tablet 8 mg  8 mg Oral Q8H PRN Augusto Gamble, MD       polyethylene glycol (MIRALAX / GLYCOLAX) packet 17 g  17 g Oral Daily PRN Augusto Gamble, MD       senna (SENOKOT) tablet 8.6 mg  1 tablet Oral QHS PRN Augusto Gamble, MD       traZODone (DESYREL) tablet 50 mg  50 mg Oral QHS PRN Lauree Chandler, NP   50 mg at 05/06/23 2107   Current Outpatient Medications  Medication Sig Dispense Refill   bictegravir-emtricitabine-tenofovir AF (BIKTARVY) 50-200-25 MG TABS tablet Take 1  tablet by mouth daily. 30 tablet 0   escitalopram (LEXAPRO) 20 MG tablet Take 1 tablet (20 mg total) by mouth daily. 30 tablet 0   levETIRAcetam (KEPPRA) 1000 MG tablet Take 1 tablet (1,000 mg total) by mouth 2 (two) times daily. 60 tablet 0   OLANZapine (ZYPREXA) 7.5 MG tablet Take 1 tablet (7.5 mg total) by mouth at bedtime. 30 tablet 0    Labs  Lab Results:  Admission on 05/03/2023, Discharged on 05/04/2023  Component Date Value Ref Range Status   Alcohol, Ethyl (B) 05/04/2023 <10  <10 mg/dL Final   Comment: (NOTE) Lowest detectable limit for serum alcohol is 10 mg/dL.  For medical purposes only. Performed at [facility], [address] [place], [place] [address]   Admission on 05/02/2023, Discharged on 05/03/2023  Component Date Value Ref Range Status   WBC 05/02/2023 8.1  4.0 - 10.5 K/uL Final   RBC 05/02/2023 4.82  4.22 - 5.81 MIL/uL Final   Hemoglobin 05/02/2023 15.7  13.0 - 17.0 g/dL Final   HCT 60/45/4098 46.8  39.0 - 52.0 % Final   MCV 05/02/2023 97.1  80.0 - 100.0 fL Final   MCH 05/02/2023 32.6  26.0 - 34.0 pg Final   MCHC 05/02/2023 33.5  30.0 - 36.0 g/dL Final   RDW 11/91/4782 11.9  11.5 - 15.5 % Final   Platelets 05/02/2023 206  150 - 400 K/uL Final   nRBC 05/02/2023 0.0  0.0 - 0.2 % Final   Neutrophils Relative % 05/02/2023 71  % Final   Neutro Abs 05/02/2023 5.7  1.7 - 7.7 K/uL Final   Lymphocytes Relative 05/02/2023 23  % Final   Lymphs Abs 05/02/2023 1.8  0.7 - 4.0 K/uL Final   Monocytes Relative 05/02/2023 6  % Final   Monocytes Absolute 05/02/2023 0.5  0.1 - 1.0 K/uL Final   Eosinophils Relative 05/02/2023 0  % Final   Eosinophils Absolute 05/02/2023 0.0  0.0 - 0.5 K/uL Final   Basophils Relative 05/02/2023 0  % Final   Basophils Absolute 05/02/2023 0.0  0.0 - 0.1 K/uL Final   Immature Granulocytes 05/02/2023 0  % Final   Abs Immature Granulocytes 05/02/2023 0.03  0.00 - 0.07 K/uL Final   Performed at [facility], [address] [place], [place] [address]   Sodium 05/02/2023 139  135 - 145 mmol/L Final   Potassium 05/02/2023 3.6  3.5 - 5.1 mmol/L Final   Chloride 05/02/2023 103  98 -  111 mmol/L Final   CO2 05/02/2023 25  22 - 32 mmol/L Final   Glucose, Bld 05/02/2023 112 (H)  70 - 99 mg/dL Final   Glucose reference range applies only to samples taken after fasting for at least 8 hours.   BUN 05/02/2023 14  6 - 20 mg/dL Final   Creatinine, Ser 05/02/2023 0.95  0.61 - 1.24 mg/dL Final   Calcium 16/04/9603 9.1  8.9 - 10.3 mg/dL Final   Total Protein 54/03/8118 7.4  6.5 - 8.1 g/dL Final   Albumin 14/78/2956 4.2  3.5 - 5.0 g/dL Final   AST 21/30/8657 25  15 - 41 U/L Final   ALT 05/02/2023 27  0 - 44 U/L Final   Alkaline Phosphatase 05/02/2023 68  38 - 126 U/L Final   Total Bilirubin 05/02/2023 1.9 (H)  0.3 - 1.2 mg/dL Final   GFR, Estimated 05/02/2023 >60  >60 mL/min Final   Comment: (NOTE) Calculated using the CKD-EPI Creatinine Equation (2021)    Anion gap 05/02/2023 11  5 - 15 Final   Performed at [facility], [address] [place], [place] [address]   Lipase 05/02/2023 33  11 - 51 U/L Final   Performed at [facility], [address], [place], [place] [address]   Levetiracetam Lvl 05/02/2023 12.2  10.0 - 40.0 ug/mL Final   Comment: (NOTE) Performed At: [facility] [address] [place], [place] [id number] Jolene Schimke MD UV:[id number]   Admission on 04/30/2023, Discharged on 05/02/2023  Component Date Value Ref Range Status   Sodium 04/30/2023 137  135 - 145 mmol/L Final   Potassium 04/30/2023 3.5  3.5 - 5.1 mmol/L Final   Chloride 04/30/2023 101  98 - 111 mmol/L Final   CO2 04/30/2023 20 (L)  22 - 32 mmol/L Final   Glucose, Bld 04/30/2023 88  70 - 99 mg/dL Final   Glucose reference range applies only to samples taken after fasting for at least 8 hours.   BUN 04/30/2023 26 (H)  6 - 20 mg/dL Final   Creatinine, Ser 04/30/2023 1.21  0.61 - 1.24  mg/dL Final   Calcium 74/25/9563 9.3  8.9 - 10.3 mg/dL Final   Total Protein 87/56/4332 7.8  6.5 - 8.1 g/dL Final   Albumin 95/18/8416 4.6  3.5 - 5.0 g/dL Final   AST 60/63/0160 53 (H)  15 - 41 U/L Final   ALT 04/30/2023 37  0 - 44 U/L Final   Alkaline Phosphatase 04/30/2023 81  38 - 126 U/L Final   Total Bilirubin 04/30/2023 2.8 (H)  0.3 - 1.2 mg/dL Final   GFR, Estimated 04/30/2023 >60  >60 mL/min Final   Comment: (NOTE) Calculated using the CKD-EPI Creatinine Equation (2021)    Anion gap 04/30/2023 16 (H)  5 - 15 Final   Performed at [facility], [address] [place], [place] [address]   WBC 04/30/2023 10.2  4.0 - 10.5 K/uL Final   RBC 04/30/2023 4.92  4.22 - 5.81 MIL/uL Final   Hemoglobin 04/30/2023 15.9  13.0 - 17.0 g/dL Final   HCT 35/57/3220 47.6  39.0 - 52.0 % Final   MCV 04/30/2023 96.7  80.0 - 100.0 fL Final   MCH 04/30/2023 32.3  26.0 - 34.0 pg Final   MCHC 04/30/2023 33.4  30.0 - 36.0 g/dL Final   RDW 25/42/7062 12.1  11.5 - 15.5 % Final   Platelets 04/30/2023 218  150 - 400 K/uL Final   nRBC  04/30/2023 0.0  0.0 - 0.2 % Final   Neutrophils Relative % 04/30/2023 64  % Final   Neutro Abs 04/30/2023 6.5  1.7 - 7.7 K/uL Final   Lymphocytes Relative 04/30/2023 25  % Final   Lymphs Abs 04/30/2023 2.5  0.7 - 4.0 K/uL Final   Monocytes Relative 04/30/2023 11  % Final   Monocytes Absolute 04/30/2023 1.1 (H)  0.1 - 1.0 K/uL Final   Eosinophils Relative 04/30/2023 0  % Final   Eosinophils Absolute 04/30/2023 0.0  0.0 - 0.5 K/uL Final   Basophils Relative 04/30/2023 0  % Final   Basophils Absolute 04/30/2023 0.0  0.0 - 0.1 K/uL Final   Immature Granulocytes 04/30/2023 0  % Final   Abs Immature Granulocytes 04/30/2023 0.03  0.00 - 0.07 K/uL Final   Performed at [facility], [address], [place], [place] [address]   Opiates 04/30/2023 NONE DETECTED  NONE DETECTED Final   Cocaine 04/30/2023 NONE DETECTED  NONE DETECTED Final    Benzodiazepines 04/30/2023 NONE DETECTED  NONE DETECTED Final   Amphetamines 04/30/2023 POSITIVE (A)  NONE DETECTED Final   Tetrahydrocannabinol 04/30/2023 NONE DETECTED  NONE DETECTED Final   Barbiturates 04/30/2023 NONE DETECTED  NONE DETECTED Final   Comment: (NOTE) DRUG SCREEN FOR MEDICAL PURPOSES ONLY.  IF CONFIRMATION IS NEEDED FOR ANY PURPOSE, NOTIFY LAB WITHIN 5 DAYS.  LOWEST DETECTABLE LIMITS FOR URINE DRUG SCREEN Drug Class                     Cutoff (ng/mL) Amphetamine and metabolites    1000 Barbiturate and metabolites    200 Benzodiazepine                 200 Opiates and metabolites        300 Cocaine and metabolites        300 THC                            50 Performed at [facility], [address], [place], [place] [address]    Color, Urine 04/30/2023 YELLOW  YELLOW Final   APPearance 04/30/2023 CLEAR  CLEAR Final   Specific Gravity, Urine 04/30/2023 1.027  1.005 - 1.030 Final   pH 04/30/2023 5.0  5.0 - 8.0 Final   Glucose, UA 04/30/2023 NEGATIVE  NEGATIVE mg/dL Final   Hgb urine dipstick 04/30/2023 NEGATIVE  NEGATIVE Final   Bilirubin Urine 04/30/2023 NEGATIVE  NEGATIVE Final   Ketones, ur 04/30/2023 20 (A)  NEGATIVE mg/dL Final   Protein, ur 29/56/2130 NEGATIVE  NEGATIVE mg/dL Final   Nitrite 86/57/8469 NEGATIVE  NEGATIVE Final   Leukocytes,Ua 04/30/2023 NEGATIVE  NEGATIVE Final   RBC / HPF 04/30/2023 0-5  0 - 5 RBC/hpf Final   WBC, UA 04/30/2023 0-5  0 - 5 WBC/hpf Final   Bacteria, UA 04/30/2023 RARE (A)  NONE SEEN Final   Squamous Epithelial / HPF 04/30/2023 0-5  0 - 5 /HPF Final   Mucus 04/30/2023 PRESENT   Final   Performed at [facility], [address], [place], [place] [address]  Office Visit on 04/25/2023  Component Date Value Ref Range Status   HIV 1 RNA Quant 04/25/2023 Not Detected  Copies/mL Final   HIV-1 RNA Quant, Log 04/25/2023 Not Detected  Log cps/mL Final   Comment: . Reference Range:  Not Detected     copies/mL                           Not Detected Log copies/mL . [name] The test was performed using Real-Time Polymerase Chain Reaction. . . Reportable Range: 20 copies/mL to 10,000,000 copies/mL (1.30 Log copies/mL to 7.00 Log copies/mL). .    CD4 T Cell Abs 04/25/2023 537  400 - 1,790 /uL Final   CD4 % Helper T Cell 04/25/2023 31 (L)  33 - 65 % Final   Performed at [facility], [address], [place], [place] [address]   RPR Ser Ql 04/25/2023 NON-REACTIVE  NON-REACTIVE Final   Comment: . No laboratory evidence of syphilis. If recent exposure is suspected, submit a new sample in 2-4 weeks. .   Admission on 04/07/2023, Discharged on 04/08/2023  Component Date Value Ref Range Status   Glucose-Capillary 04/07/2023 126 (H)  70 - 99 mg/dL Final   Glucose reference range applies only to samples taken after fasting for at least 8 hours.   Sodium 04/07/2023 142  135 - 145 mmol/L Final   Potassium 04/07/2023 3.9  3.5 - 5.1 mmol/L Final   Chloride 04/07/2023 104  98 - 111 mmol/L Final   CO2 04/07/2023 26  22 - 32 mmol/L Final   Glucose, Bld 04/07/2023 92  70 - 99 mg/dL Final   Glucose reference range applies only to samples taken after fasting for at least 8 hours.   BUN 04/07/2023 10  6 - 20 mg/dL Final   Creatinine, Ser 04/07/2023 0.97  0.61 - 1.24 mg/dL Final   Calcium 19/14/7829 9.6  8.9 - 10.3 mg/dL Final   Total Protein 56/21/3086 8.0  6.5 - 8.1 g/dL Final   Albumin 57/84/6962 4.4  3.5 - 5.0 g/dL Final   AST 95/28/4132 25  15 - 41 U/L Final   ALT 04/07/2023 33  0 - 44 U/L Final   Alkaline Phosphatase 04/07/2023 85  38 - 126 U/L Final   Total Bilirubin 04/07/2023 1.9 (H)  0.3 - 1.2 mg/dL Final   GFR, Estimated 04/07/2023 >60  >60 mL/min Final   Comment: (NOTE) Calculated using the CKD-EPI Creatinine Equation (2021)    Anion gap 04/07/2023 12  5 - 15 Final   Performed at [facility], [address],  [place], [place] [address]   Alcohol, Ethyl (B) 04/07/2023 <10  <10 mg/dL Final   Comment: (NOTE) Lowest detectable limit for serum alcohol is 10 mg/dL.  For medical purposes only. Performed at [facility], [address], [place], [place] [address]    Salicylate Lvl 04/07/2023 <7.0 (L)  7.0 - 30.0 mg/dL Final   Performed at [facility], [address] [place], [place] [address]   Acetaminophen (Tylenol), Serum 04/07/2023 <10 (L)  10 - 30 ug/mL Final   Comment: (NOTE) Therapeutic concentrations vary significantly. A range of 10-30 ug/mL  may be an effective concentration for many patients. However, some  are best treated at concentrations outside of this range. Acetaminophen concentrations >150 ug/mL at 4 hours after ingestion  and >50 ug/mL at 12 hours after ingestion are often associated with  toxic reactions.  Performed at [facility], [address], [place], [place] [address]    WBC 04/07/2023 8.7  4.0 - 10.5 K/uL Final   RBC 04/07/2023 5.01  4.22 - 5.81 MIL/uL Final   Hemoglobin 04/07/2023 16.5  13.0 - 17.0 g/dL Final   HCT 31/51/7616 50.2  39.0 - 52.0 % Final   MCV 04/07/2023 100.2 (H)  80.0 - 100.0 fL Final   MCH 04/07/2023 32.9  26.0 - 34.0 pg Final   MCHC 04/07/2023 32.9  30.0 - 36.0 g/dL Final   RDW 07/37/1062 12.3  11.5 - 15.5 % Final   Platelets 04/07/2023 229  150 - 400 K/uL Final   nRBC 04/07/2023 0.0  0.0 - 0.2 % Final   Performed at [facility], [address] [place], [place] [address]   Opiates 04/07/2023 NONE DETECTED  NONE DETECTED Final   Cocaine 04/07/2023 NONE DETECTED  NONE DETECTED Final   Benzodiazepines 04/07/2023 NONE DETECTED  NONE DETECTED Final   Amphetamines 04/07/2023 NONE DETECTED  NONE DETECTED Final   Tetrahydrocannabinol 04/07/2023 POSITIVE (A)  NONE DETECTED Final   Barbiturates 04/07/2023 NONE DETECTED  NONE DETECTED Final   Comment: (NOTE) DRUG SCREEN FOR  MEDICAL PURPOSES ONLY.  IF CONFIRMATION IS NEEDED FOR ANY PURPOSE, NOTIFY LAB WITHIN 5 DAYS.  LOWEST DETECTABLE LIMITS FOR URINE DRUG SCREEN Drug Class                     Cutoff (ng/mL) Amphetamine and metabolites    1000 Barbiturate and metabolites    200 Benzodiazepine                 200 Opiates and metabolites        300 Cocaine and metabolites        300 THC                            50 Performed at [facility], [address], [place], [place] [address]   Admission on 04/01/2023, Discharged on 04/02/2023  Component Date Value Ref Range Status   WBC 04/01/2023 7.2  4.0 - 10.5 K/uL Final   RBC 04/01/2023 4.80  4.22 - 5.81 MIL/uL Final   Hemoglobin 04/01/2023 15.2  13.0 - 17.0 g/dL Final   HCT 35/00/9381 47.4  39.0 - 52.0 % Final   MCV 04/01/2023 98.8  80.0 - 100.0 fL Final   MCH 04/01/2023 31.7  26.0 - 34.0 pg Final   MCHC 04/01/2023 32.1  30.0 - 36.0 g/dL Final   RDW 82/99/3716 11.9  11.5 - 15.5 % Final   Platelets 04/01/2023 220  150 - 400 K/uL Final   nRBC 04/01/2023 0.0  0.0 - 0.2 % Final   Neutrophils Relative % 04/01/2023 42  % Final   Neutro Abs 04/01/2023 3.0  1.7 - 7.7 K/uL Final   Lymphocytes Relative 04/01/2023 46  % Final   Lymphs Abs 04/01/2023 3.3  0.7 - 4.0 K/uL Final   Monocytes Relative 04/01/2023 9  % Final   Monocytes Absolute 04/01/2023 0.6  0.1 - 1.0 K/uL Final   Eosinophils Relative 04/01/2023 2  % Final   Eosinophils Absolute 04/01/2023 0.1  0.0 - 0.5 K/uL Final   Basophils Relative 04/01/2023 1  % Final   Basophils Absolute 04/01/2023 0.1  0.0 - 0.1 K/uL Final   Immature Granulocytes 04/01/2023 0  % Final   Abs Immature Granulocytes 04/01/2023 0.02  0.00 - 0.07 K/uL Final   Performed at [facility], [address], [place], [place] [address]   Sodium 04/01/2023 141  135 - 145 mmol/L Final   Potassium 04/01/2023 4.1  3.5 - 5.1 mmol/L Final  Chloride 04/01/2023 106  98 - 111 mmol/L Final   CO2 04/01/2023 25  22 - 32  mmol/L Final   Glucose, Bld 04/01/2023 100 (H)  70 - 99 mg/dL Final   Glucose reference range applies only to samples taken after fasting for at least 8 hours.   BUN 04/01/2023 9  6 - 20 mg/dL Final   Creatinine, Ser 04/01/2023 1.10  0.61 - 1.24 mg/dL Final   Calcium 16/04/9603 9.5  8.9 - 10.3 mg/dL Final   Total Protein 54/03/8118 6.5  6.5 - 8.1 g/dL Final   Albumin 14/78/2956 3.9  3.5 - 5.0 g/dL Final   AST 21/30/8657 34  15 - 41 U/L Final   ALT 04/01/2023 37  0 - 44 U/L Final   Alkaline Phosphatase 04/01/2023 73  38 - 126 U/L Final   Total Bilirubin 04/01/2023 1.0  0.3 - 1.2 mg/dL Final   GFR, Estimated 04/01/2023 >60  >60 mL/min Final   Comment: (NOTE) Calculated using the CKD-EPI Creatinine Equation (2021)    Anion gap 04/01/2023 10  5 - 15 Final   Performed at [facility], [address], [place], [place] [address]  Admission on 03/29/2023, Discharged on 03/30/2023  Component Date Value Ref Range Status   Sodium 03/29/2023 139  135 - 145 mmol/L Final   Potassium 03/29/2023 3.5  3.5 - 5.1 mmol/L Final   Chloride 03/29/2023 99  98 - 111 mmol/L Final   CO2 03/29/2023 28  22 - 32 mmol/L Final   Glucose, Bld 03/29/2023 97  70 - 99 mg/dL Final   Glucose reference range applies only to samples taken after fasting for at least 8 hours.   BUN 03/29/2023 8  6 - 20 mg/dL Final   Creatinine, Ser 03/29/2023 1.07  0.61 - 1.24 mg/dL Final   Calcium 29/52/8413 9.5  8.9 - 10.3 mg/dL Final   Total Protein 24/40/1027 7.1  6.5 - 8.1 g/dL Final   Albumin 25/36/6440 4.1  3.5 - 5.0 g/dL Final   AST 34/74/2595 37  15 - 41 U/L Final   ALT 03/29/2023 41  0 - 44 U/L Final   Alkaline Phosphatase 03/29/2023 66  38 - 126 U/L Final   Total Bilirubin 03/29/2023 2.0 (H)  0.3 - 1.2 mg/dL Final   GFR, Estimated 03/29/2023 >60  >60 mL/min Final   Comment: (NOTE) Calculated using the CKD-EPI Creatinine Equation (2021)    Anion gap 03/29/2023 12  5 - 15 Final   Performed at [facility],  [address] [place], [place] [address]   Alcohol, Ethyl (B) 03/29/2023 <10  <10 mg/dL Final   Comment: (NOTE) Lowest detectable limit for serum alcohol is 10 mg/dL.  For medical purposes only. Performed at [facility], [address] [place], [place] [address]    Salicylate Lvl 03/29/2023 <7.0 (L)  7.0 - 30.0 mg/dL Final   Performed at [facility], [address] [place], [place] [address]   Acetaminophen (Tylenol), Serum 03/29/2023 <10 (L)  10 - 30 ug/mL Final   Comment: (NOTE) Therapeutic concentrations vary significantly. A range of 10-30 ug/mL  may be an effective concentration for many patients. However, some  are best treated at concentrations outside of this range. Acetaminophen concentrations >150 ug/mL at 4 hours after ingestion  and >50 ug/mL at 12 hours after ingestion are often associated with  toxic reactions.  Performed at [facility], [address], [place], [place] [address]  WBC 03/29/2023 5.6  4.0 - 10.5 K/uL Final   RBC 03/29/2023 4.92  4.22 - 5.81 MIL/uL Final   Hemoglobin 03/29/2023 15.5  13.0 - 17.0 g/dL Final   HCT 16/04/9603 46.6  39.0 - 52.0 % Final   MCV 03/29/2023 94.7  80.0 - 100.0 fL Final   MCH 03/29/2023 31.5  26.0 - 34.0 pg Final   MCHC 03/29/2023 33.3  30.0 - 36.0 g/dL Final   RDW 54/03/8118 12.0  11.5 - 15.5 % Final   Platelets 03/29/2023 231  150 - 400 K/uL Final   nRBC 03/29/2023 0.0  0.0 - 0.2 % Final   Performed at [facility], [address], [place], [place] [address]   Opiates 03/29/2023 NONE DETECTED  NONE DETECTED Final   Cocaine 03/29/2023 NONE DETECTED  NONE DETECTED Final   Benzodiazepines 03/29/2023 NONE DETECTED  NONE DETECTED Final   Amphetamines 03/29/2023 NONE DETECTED  NONE DETECTED Final   Tetrahydrocannabinol 03/29/2023 POSITIVE (A)  NONE DETECTED Final   Barbiturates 03/29/2023 NONE DETECTED  NONE DETECTED Final   Comment: (NOTE) DRUG SCREEN FOR MEDICAL PURPOSES ONLY.  IF CONFIRMATION IS  NEEDED FOR ANY PURPOSE, NOTIFY LAB WITHIN 5 DAYS.  LOWEST DETECTABLE LIMITS FOR URINE DRUG SCREEN Drug Class                     Cutoff (ng/mL) Amphetamine and metabolites    1000 Barbiturate and metabolites    200 Benzodiazepine                 200 Opiates and metabolites        300 Cocaine and metabolites        300 THC                            50 Performed at [facility], [address], [place], [place] [address]    Glucose-Capillary 03/29/2023 150 (H)  70 - 99 mg/dL Final   Glucose reference range applies only to samples taken after fasting for at least 8 hours.  Admission on 03/26/2023, Discharged on 03/26/2023  Component Date Value Ref Range Status   Glucose-Capillary 03/26/2023 86  70 - 99 mg/dL Final   Glucose reference range applies only to samples taken after fasting for at least 8 hours.  Admission on 03/26/2023, Discharged on 03/26/2023  Component Date Value Ref Range Status   Sodium 03/26/2023 135  135 - 145 mmol/L Final   Potassium 03/26/2023 3.0 (L)  3.5 - 5.1 mmol/L Final   Chloride 03/26/2023 100  98 - 111 mmol/L Final   CO2 03/26/2023 19 (L)  22 - 32 mmol/L Final   Glucose, Bld 03/26/2023 90  70 - 99 mg/dL Final   Glucose reference range applies only to samples taken after fasting for at least 8 hours.   BUN 03/26/2023 18  6 - 20 mg/dL Final   Creatinine, Ser 03/26/2023 1.26 (H)  0.61 - 1.24 mg/dL Final   Calcium 30/86/5784 9.0  8.9 - 10.3 mg/dL Final   GFR, Estimated 03/26/2023 >60  >60 mL/min Final   Comment: (NOTE) Calculated using the CKD-EPI Creatinine Equation (2021)    Anion gap 03/26/2023 16 (H)  5 - 15 Final   Performed at [facility], [address] [place], [place] [address]   WBC 03/26/2023 7.4  4.0 - 10.5 K/uL Final   RBC 03/26/2023 5.06  4.22 - 5.81  MIL/uL Final   Hemoglobin 03/26/2023 16.0  13.0 - 17.0 g/dL Final   HCT 16/04/9603 46.8  39.0 - 52.0 % Final   MCV 03/26/2023 92.5  80.0 - 100.0 fL Final   MCH 03/26/2023 31.6   26.0 - 34.0 pg Final   MCHC 03/26/2023 34.2  30.0 - 36.0 g/dL Final   RDW 54/03/8118 12.2  11.5 - 15.5 % Final   Platelets 03/26/2023 233  150 - 400 K/uL Final   nRBC 03/26/2023 0.0  0.0 - 0.2 % Final   Neutrophils Relative % 03/26/2023 49  % Final   Neutro Abs 03/26/2023 3.6  1.7 - 7.7 K/uL Final   Lymphocytes Relative 03/26/2023 37  % Final   Lymphs Abs 03/26/2023 2.8  0.7 - 4.0 K/uL Final   Monocytes Relative 03/26/2023 11  % Final   Monocytes Absolute 03/26/2023 0.8  0.1 - 1.0 K/uL Final   Eosinophils Relative 03/26/2023 2  % Final   Eosinophils Absolute 03/26/2023 0.1  0.0 - 0.5 K/uL Final   Basophils Relative 03/26/2023 1  % Final   Basophils Absolute 03/26/2023 0.0  0.0 - 0.1 K/uL Final   Immature Granulocytes 03/26/2023 0  % Final   Abs Immature Granulocytes 03/26/2023 0.02  0.00 - 0.07 K/uL Final   Performed at [facility], [address], [place], [place] [address]   Magnesium 03/26/2023 1.8  1.7 - 2.4 mg/dL Final   Performed at [facility], [address] [place], [place] [address]  Admission on 03/23/2023, Discharged on 03/23/2023  Component Date Value Ref Range Status   Glucose-Capillary 03/23/2023 128 (H)  70 - 99 mg/dL Final   Glucose reference range applies only to samples taken after fasting for at least 8 hours.   Sodium 03/23/2023 142  135 - 145 mmol/L Final   Potassium 03/23/2023 3.3 (L)  3.5 - 5.1 mmol/L Final   Chloride 03/23/2023 107  98 - 111 mmol/L Final   CO2 03/23/2023 25  22 - 32 mmol/L Final   Glucose, Bld 03/23/2023 108 (H)  70 - 99 mg/dL Final   Glucose reference range applies only to samples taken after fasting for at least 8 hours.   BUN 03/23/2023 13  6 - 20 mg/dL Final   Creatinine, Ser 03/23/2023 1.00  0.61 - 1.24 mg/dL Final   Calcium 30/86/5784 9.0  8.9 - 10.3 mg/dL Final   Total Protein 69/62/9528 7.4  6.5 - 8.1 g/dL Final   Albumin 41/32/4401 4.1  3.5 - 5.0 g/dL Final   AST 02/72/5366 34  15 - 41 U/L Final   ALT 03/23/2023 36   0 - 44 U/L Final   Alkaline Phosphatase 03/23/2023 63  38 - 126 U/L Final   Total Bilirubin 03/23/2023 1.8 (H)  0.3 - 1.2 mg/dL Final   GFR, Estimated 03/23/2023 >60  >60 mL/min Final   Comment: (NOTE) Calculated using the CKD-EPI Creatinine Equation (2021)    Anion gap 03/23/2023 10  5 - 15 Final   Performed at [facility], [address], [place], [place] [address]   WBC 03/23/2023 5.7  4.0 - 10.5 K/uL Final   RBC 03/23/2023 4.69  4.22 - 5.81 MIL/uL Final   Hemoglobin 03/23/2023 15.1  13.0 - 17.0 g/dL Final   HCT 74/25/9563 45.4  39.0 - 52.0 % Final   MCV 03/23/2023 96.8  80.0 - 100.0 fL Final   MCH 03/23/2023 32.2  26.0 - 34.0 pg Final   MCHC  03/23/2023 33.3  30.0 - 36.0 g/dL Final   RDW 28/41/3244 12.6  11.5 - 15.5 % Final   Platelets 03/23/2023 208  150 - 400 K/uL Final   nRBC 03/23/2023 0.0  0.0 - 0.2 % Final   Neutrophils Relative % 03/23/2023 62  % Final   Neutro Abs 03/23/2023 3.6  1.7 - 7.7 K/uL Final   Lymphocytes Relative 03/23/2023 25  % Final   Lymphs Abs 03/23/2023 1.5  0.7 - 4.0 K/uL Final   Monocytes Relative 03/23/2023 11  % Final   Monocytes Absolute 03/23/2023 0.6  0.1 - 1.0 K/uL Final   Eosinophils Relative 03/23/2023 1  % Final   Eosinophils Absolute 03/23/2023 0.0  0.0 - 0.5 K/uL Final   Basophils Relative 03/23/2023 1  % Final   Basophils Absolute 03/23/2023 0.0  0.0 - 0.1 K/uL Final   Immature Granulocytes 03/23/2023 0  % Final   Abs Immature Granulocytes 03/23/2023 0.01  0.00 - 0.07 K/uL Final   Performed at [facility], [address], [place], [place] [address]   Magnesium 03/23/2023 2.2  1.7 - 2.4 mg/dL Final   Performed at [facility], [address], [place], [place] [address]   Specimen Source 03/23/2023 URINE, CLEAN CATCH   Final   Color, Urine 03/23/2023 AMBER (A)  YELLOW Final   BIOCHEMICALS MAY BE AFFECTED BY COLOR   APPearance 03/23/2023 CLEAR  CLEAR Final   Specific Gravity, Urine  03/23/2023 1.030  1.005 - 1.030 Final   pH 03/23/2023 6.0  5.0 - 8.0 Final   Glucose, UA 03/23/2023 NEGATIVE  NEGATIVE mg/dL Final   Hgb urine dipstick 03/23/2023 NEGATIVE  NEGATIVE Final   Bilirubin Urine 03/23/2023 NEGATIVE  NEGATIVE Final   Ketones, ur 03/23/2023 5 (A)  NEGATIVE mg/dL Final   Protein, ur 44/09/4740 NEGATIVE  NEGATIVE mg/dL Final   Nitrite 59/56/3875 NEGATIVE  NEGATIVE Final   Leukocytes,Ua 03/23/2023 NEGATIVE  NEGATIVE Final   RBC / HPF 03/23/2023 0-5  0 - 5 RBC/hpf Final   WBC, UA 03/23/2023 0-5  0 - 5 WBC/hpf Final   Comment:        Reflex urine culture not performed if WBC <=10, OR if Squamous epithelial cells >5. If Squamous epithelial cells >5 suggest recollection.    Bacteria, UA 03/23/2023 NONE SEEN  NONE SEEN Final   Squamous Epithelial / HPF 03/23/2023 0-5  0 - 5 /HPF Final   Mucus 03/23/2023 PRESENT   Final   Performed at [facility], [address], [place], [place] [address]   Opiates 03/23/2023 NONE DETECTED  NONE DETECTED Final   Cocaine 03/23/2023 NONE DETECTED  NONE DETECTED Final   Benzodiazepines 03/23/2023 NONE DETECTED  NONE DETECTED Final   Amphetamines 03/23/2023 NONE DETECTED  NONE DETECTED Final   Tetrahydrocannabinol 03/23/2023 NONE DETECTED  NONE DETECTED Final   Barbiturates 03/23/2023 NONE DETECTED  NONE DETECTED Final   Comment: (NOTE) DRUG SCREEN FOR MEDICAL PURPOSES ONLY.  IF CONFIRMATION IS NEEDED FOR ANY PURPOSE, NOTIFY LAB WITHIN 5 DAYS.  LOWEST DETECTABLE LIMITS FOR URINE DRUG SCREEN Drug Class                     Cutoff (ng/mL) Amphetamine and metabolites    1000 Barbiturate and metabolites    200 Benzodiazepine                 200 Opiates and metabolites        300 Cocaine  and metabolites        300 THC                            50 Performed at [facility], [address] [place], [place] [address]    Alcohol, Ethyl (B) 03/23/2023 <10  <10 mg/dL Final   Comment:  (NOTE) Lowest detectable limit for serum alcohol is 10 mg/dL.  For medical purposes only. Performed at [facility], [address], [place], [place] [address]    Levetiracetam Lvl 03/23/2023 25.9  10.0 - 40.0 ug/mL Final   Comment: (NOTE) Performed At: [facility] [address] [place], [place] [id number] Jolene Schimke MD RJ:[id number]   There may be more visits with results that are not included.    Blood Alcohol level:  Lab Results  Component Value Date   ETH <10 05/04/2023   ETH <10 04/07/2023    Metabolic Disorder Labs: Lab Results  Component Value Date   HGBA1C 4.7 (L) 04/18/2020   MPG 88 04/18/2020   No results found for: "PROLACTIN" Lab Results  Component Value Date   CHOL 173 03/03/2023   TRIG 103 03/03/2023   HDL 44 03/03/2023   CHOLHDL 3.9 03/03/2023   VLDL 8 04/18/2020   LDLCALC 109 (H) 03/03/2023   LDLCALC 97 08/03/2022    Therapeutic Lab Levels: No results found for: "LITHIUM" No results found for: "VALPROATE" No results found for: "CBMZ"  Physical Findings   PHQ2-9    Flowsheet Row ED from 05/04/2023 in [facility] Office Visit from 04/25/2023 in [facility] for Infectious Disease Office Visit from 08/17/2022 in [facility] for Infectious Disease Office Visit from 06/30/2021 in [facility] for Infectious Disease ED from 04/04/2021 in [facility] Emergency Department at [facility]  PHQ-2 Total Score 4 1 1 2 1   PHQ-9 Total Score 23 -- -- -- 3      Flowsheet Row ED from 05/04/2023 in [facility] ED from 05/03/2023 in [facility] ED from 05/02/2023 in [facility] Emergency Department at [facility]  C-SSRS RISK CATEGORY High Risk High Risk Error: Question 6 not populated        Musculoskeletal  Strength & Muscle Tone: within normal limited Gait & Station: laying  down Patient leans: laying down  Psychiatric Specialty Exam  Presentation  General Appearance:Appropriate for Environment, Casual Eye Contact:Minimal Speech:Clear and Coherent, Normal Rate Volume:Decreased Handedness:Right  Mood and Affect  Mood:Depressed Affect:Appropriate, Congruent, Restricted  Thought Process  Thought Process:Coherent, Goal Directed, Linear Descriptions of Associations:Intact  Thought Content Suicidal Thoughts:No Homicidal Thoughts:No Hallucinations:None Ideas of Reference:None Thought Content:Rumination  Sensorium  Memory:Immediate Good Judgment:Fair Insight:Poor  Executive Functions  Orientation:Full (Time, Place and Person) Language:Good Concentration:Good Attention:Good Recall:Good Fund of Knowledge:Good  Psychomotor Activity  Psychomotor Activity:Psychomotor Activity: Decreased, Psychomotor Retardation  Assets  Assets:Communication Skills, Desire for Improvement, Resilience  Sleep  Quality:Good  Physical Exam  Physical Exam Vitals and nursing note reviewed.  HENT:     Head: Normocephalic and atraumatic.  Pulmonary:     Effort: Pulmonary effort is normal. No respiratory distress.  Neurological:     General: No focal deficit present.     Mental Status: He is alert and oriented to person, place, and time.     Gait: Gait normal.    Blood pressure 108/85, pulse 88, temperature 97.6 F (  36.4 C), temperature source Oral, resp. rate 18, SpO2 98%. There is no height or weight on file to calculate BMI.  Treatment Plan Summary: Daily contact with patient to assess and evaluate symptoms and progress in treatment and Medication management  Mood continues to improve, evident by increased energy and brighter affect.  Confirmed with friend that he is bringing patient's home biktarvy to him in preparation of going to residential tomorrow.   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  Continued home lexapro 20 mg daily Continued home zyprexa 7.5 mg at bedtime Comfort PRNs   Cannabis use d/o Precontemplative stage. Encouraged cessation Comfort PRNs   Tobacco use d/o NRTs Encouraged cessation   Seizure Keppra level therapeutic  Continued home keppra 1000 mg BID  HIV Continued home biktarvy  Dispo:  - Residential rehab Monday 11/4 - Printed scripts for rehab signed and placed in patient's chart   Princess Bruins, DO Psych Resident, PGY-3 05/07/2023 3:37 PM

## 2023-05-07 NOTE — ED Notes (Signed)
Patient in the bedroom calm and composed. NAD,respirations are even and unlabored. Will continue to monitor for safety.

## 2023-05-07 NOTE — ED Notes (Signed)
Pt was provided breakfast.

## 2023-05-07 NOTE — Group Note (Signed)
Group Topic: Positive Affirmations  Group Date: 05/07/2023 Start Time: 0950 End Time: 1020 Facilitators: Londell Moh, NT  Department: [facility]  Number of Participants: 4  Group Focus: check in, daily focus, and goals/reality orientation Treatment Modality:  Psychoeducation Interventions utilized were patient education Purpose: increase insight  Name: Justin [name] Date of Birth: 1981/03/01  MR: [id number]    Level of Participation: active Quality of Participation: attentive Interactions with others: gave feedback Mood/Affect: appropriate and brightens with interaction Triggers (if applicable): n/a Cognition: coherent/clear and goal directed Progress: Gaining insight Response: Pt was active during group, he was able to identify his goal for the day and was able to write about what he is grateful for.  Plan: patient will be encouraged to continue to attend groups.  Patients Problems:  Patient Active Problem List   Diagnosis Date Noted   MDD (major depressive disorder) 05/04/2023   Severe episode of recurrent major depressive disorder, with psychotic features (HCC) 05/03/2023   Suicidal thoughts 05/03/2023   Homicidal ideation 04/07/2023   Cannabis abuse 04/07/2023   Suicidal ideation 04/07/2023   Homelessness 03/30/2023   Cannabis use disorder, severe, dependence (HCC) 03/30/2023   Malingering 03/30/2023   Housing instability due to imminent risk of homelessness 03/03/2023   URI with cough and congestion 03/03/2023   MDD (major depressive disorder), recurrent severe, without psychosis (HCC) 01/18/2023   MDD (major depressive disorder), recurrent, severe, 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/06/2023   Seizure (HCC) 12/24/2021   Seizure disorder (HCC) 10/28/2021   Seizures (HCC) 07/31/2021   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   Suicidal ideations 07/08/2020   Amphetamine and psychostimulant-induced psychosis with delusions (HCC)     Amphetamine use disorder, 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

## 2023-05-07 NOTE — ED Notes (Signed)
Pt was provided dinner.

## 2023-05-08 DIAGNOSIS — F15250 Other stimulant dependence with stimulant-induced psychotic disorder with delusions: Secondary | ICD-10-CM | POA: Diagnosis not present

## 2023-05-08 DIAGNOSIS — F329 Major depressive disorder, single episode, unspecified: Secondary | ICD-10-CM | POA: Diagnosis not present

## 2023-05-08 DIAGNOSIS — Z21 Asymptomatic human immunodeficiency virus [HIV] infection status: Secondary | ICD-10-CM | POA: Diagnosis not present

## 2023-05-08 DIAGNOSIS — F419 Anxiety disorder, unspecified: Secondary | ICD-10-CM | POA: Diagnosis not present

## 2023-05-08 MED ORDER — TRAZODONE HCL 50 MG PO TABS: 50.0000 mg | ORAL_TABLET | Freq: Every evening | ORAL | 0 refills | Status: DC | PRN

## 2023-05-08 NOTE — ED Provider Notes (Signed)
FBC/OBS ASAP Discharge Summary  Date and Time: 05/08/2023 1:46 PM  Name: Justin [name]  MRN:  [id number]   Discharge Diagnoses:  Final diagnoses:  Substance induced mood disorder (HCC)  Cannabis use disorder  Human immunodeficiency virus (HIV) disease (HCC)  Amphetamine and psychostimulant-induced psychosis with delusions (HCC)  Amphetamine use disorder, severe (HCC)   Subjective: On day of discharge, patient denied SI/HI/AVH. Patient was motivated to continue his treatment with [employer]. His friend brought his medications from home.   Stay Summary:  Justin [name] is a 42 y.o., male experiencing homelessness with a past psychiatric history of MDD, unspecified anxiety, substance-induced mood disorder, x1 prior psychiatric hospitalizations (once at [facility] for suicide attempt) and significant PMHx of seizure disorder  who presents to the [facility] Crisis center from behavioral health urgent care [facility]) for evaluation and management of suicidal ideations in the setting of chronic medical issues, psychosocial stressors, and recurrent drug use.   During the patient's hospitalization, patient had extensive initial psychiatric evaluation, and follow-up psychiatric evaluations every day.  Psychiatric diagnoses provided upon initial assessment:  MDD Unspecified anxiety Substance-induced mood disorder Tobacco use disorder  Patient's psychiatric medications were adjusted on admission:  -continued on lexapro 20mg  daily for depressive symptoms -continued on zyprexa 10mg  at bedtime for AVH   During the hospitalization, other adjustments were made to the patient's psychiatric medication regimen:  -patient's zyprexa was decreased to 7.5mg  2/2 report that it was his home dose   Patient's care was discussed during the interdisciplinary team meeting every day during the hospitalization.  The patient denied having side effects to prescribed psychiatric medication.  Gradually, patient  started adjusting to milieu. The patient was evaluated each day by a clinical provider to ascertain response to treatment. Improvement was noted by the patient's report of decreasing symptoms, improved sleep and appetite, affect, medication tolerance, behavior, and participation in unit programming.  Patient was asked each day to complete a self inventory noting mood, mental status, pain, new symptoms, anxiety and concerns.    Symptoms were reported as significantly decreased or resolved completely by discharge.   On day of discharge, the patient reports that their mood is stable. The patient denied having suicidal thoughts for more than 48 hours prior to discharge.  Patient denies having homicidal thoughts.  Patient denies having auditory hallucinations.  Patient denies any visual hallucinations or other symptoms of psychosis. The patient was motivated to continue taking medication with a goal of continued improvement in mental health.   The patient reports their target psychiatric symptoms of depression responded well to the psychiatric medications, and the patient reports overall benefit other psychiatric hospitalization. Supportive psychotherapy was provided to the patient. The patient also participated in regular group therapy while hospitalized. Coping skills, problem solving as well as relaxation therapies were also part of the unit programming.  Labs were reviewed with the patient, and abnormal results were discussed with the patient.  The patient is able to verbalize their individual safety plan to this provider.  # It is recommended to the patient to continue psychiatric medications as prescribed, after discharge from the hospital.    # It is recommended to the patient to follow up with your outpatient psychiatric provider and PCP.  # It was discussed with the patient, the impact of alcohol, drugs, tobacco have been there overall psychiatric and medical wellbeing, and total abstinence from  substance use was recommended the patient.ed.  # Prescriptions provided or sent directly to preferred pharmacy at  discharge. Patient agreeable to plan. Given opportunity to ask questions. Appears to feel comfortable with discharge.    # In the event of worsening symptoms, the patient is instructed to call the crisis hotline, 911 and or go to the nearest ED for appropriate evaluation and treatment of symptoms. To follow-up with primary care provider for other medical issues, concerns and or health care needs  # Patient was discharged [facility] with a plan to follow up as noted below.   Total Time spent with patient: 20 minutes  Past Psychiatric History:  schizoaffective disorder vs MDD, substance-induced mood disorder, unspecified anxiety  Past Medical History: chronic HCV infection, HIV, and seizure disorder  Family History: DM II in mother  Social History: currently homeless, reports he has some friends but no family in area  Tobacco Cessation:  A prescription for an FDA-approved tobacco cessation medication was offered at discharge and the patient refused  Current Medications:  Current Facility-Administered Medications  Medication Dose Route Frequency Provider Last Rate Last Admin   acetaminophen (TYLENOL) tablet 650 mg  650 mg Oral Q6H PRN Augusto Gamble, MD       alum & mag hydroxide-simeth (MAALOX/MYLANTA) 200-200-20 MG/5ML suspension 30 mL  30 mL Oral Q4H PRN Augusto Gamble, MD       bictegravir-emtricitabine-tenofovir AF (BIKTARVY) 50-200-25 MG per tablet 1 tablet  1 tablet Oral Daily Lauree Chandler, NP   1 tablet at 05/08/23 0912   bismuth subsalicylate (PEPTO BISMOL) chewable tablet 524 mg  524 mg Oral Q3H PRN Augusto Gamble, MD       OLANZapine zydis (ZYPREXA) disintegrating tablet 5 mg  5 mg Oral Q6H PRN Augusto Gamble, MD       And   LORazepam (ATIVAN) tablet 1 mg  1 mg Oral Q6H PRN Augusto Gamble, MD       And   diphenhydrAMINE (BENADRYL) capsule 25 mg  25 mg Oral Q6H PRN Augusto Gamble, MD       haloperidol lactate (HALDOL) injection 5 mg  5 mg Intramuscular Q6H PRN Augusto Gamble, MD       And   LORazepam (ATIVAN) injection 1 mg  1 mg Intravenous Q6H PRN Augusto Gamble, MD       And   diphenhydrAMINE (BENADRYL) injection 25 mg  25 mg Intramuscular Q6H PRN Augusto Gamble, MD       escitalopram (LEXAPRO) tablet 20 mg  20 mg Oral Daily Lauree Chandler, NP   20 mg at 05/08/23 0912   hydrOXYzine (ATARAX) tablet 25 mg  25 mg Oral TID PRN Augusto Gamble, MD   25 mg at 05/07/23 2004   levETIRAcetam (KEPPRA) tablet 1,000 mg  1,000 mg Oral BID Lauree Chandler, NP   1,000 mg at 05/08/23 0912   nicotine (NICODERM CQ - dosed in mg/24 hours) patch 14 mg  14 mg Transdermal Daily Bobbitt, Shalon E, NP   14 mg at 05/08/23 4098   nicotine polacrilex (NICORETTE) gum 2 mg  2 mg Oral PRN Augusto Gamble, MD   2 mg at 05/07/23 1410   OLANZapine (ZYPREXA) tablet 7.5 mg  7.5 mg Oral QHS Princess Bruins, DO   7.5 mg at 05/07/23 2004   ondansetron (ZOFRAN) tablet 8 mg  8 mg Oral Q8H PRN Augusto Gamble, MD       polyethylene glycol (MIRALAX / GLYCOLAX) packet 17 g  17 g Oral Daily PRN Augusto Gamble, MD       senna (SENOKOT) tablet 8.6 mg  1 tablet Oral QHS PRN Augusto Gamble, MD       traZODone (DESYREL) tablet 50 mg  50 mg Oral QHS PRN Lauree Chandler, NP   50 mg at 05/07/23 2109   Current Outpatient Medications  Medication Sig Dispense Refill   bictegravir-emtricitabine-tenofovir AF (BIKTARVY) 50-200-25 MG TABS tablet Take 1 tablet by mouth daily. 30 tablet 0   escitalopram (LEXAPRO) 20 MG tablet Take 1 tablet (20 mg total) by mouth daily. 30 tablet 0   levETIRAcetam (KEPPRA) 1000 MG tablet Take 1 tablet (1,000 mg total) by mouth 2 (two) times daily. 60 tablet 0   OLANZapine (ZYPREXA) 7.5 MG tablet Take 1 tablet (7.5 mg total) by mouth at bedtime. 30 tablet 0   traZODone (DESYREL) 50 MG tablet Take 1 tablet (50 mg total) by mouth at bedtime as needed for sleep. 30 tablet 0    PTA Medications:  PTA  Medications  Medication Sig   bictegravir-emtricitabine-tenofovir AF (BIKTARVY) 50-200-25 MG TABS tablet Take 1 tablet by mouth daily.   escitalopram (LEXAPRO) 20 MG tablet Take 1 tablet (20 mg total) by mouth daily.   levETIRAcetam (KEPPRA) 1000 MG tablet Take 1 tablet (1,000 mg total) by mouth 2 (two) times daily.   OLANZapine (ZYPREXA) 7.5 MG tablet Take 1 tablet (7.5 mg total) by mouth at bedtime.   traZODone (DESYREL) 50 MG tablet Take 1 tablet (50 mg total) by mouth at bedtime as needed for sleep.   Facility Ordered Medications  Medication   [COMPLETED] traZODone (DESYREL) tablet 100 mg   traZODone (DESYREL) tablet 50 mg   bictegravir-emtricitabine-tenofovir AF (BIKTARVY) 50-200-25 MG per tablet 1 tablet   escitalopram (LEXAPRO) tablet 20 mg   levETIRAcetam (KEPPRA) tablet 1,000 mg   nicotine (NICODERM CQ - dosed in mg/24 hours) patch 14 mg   acetaminophen (TYLENOL) tablet 650 mg   alum & mag hydroxide-simeth (MAALOX/MYLANTA) 200-200-20 MG/5ML suspension 30 mL   bismuth subsalicylate (PEPTO BISMOL) chewable tablet 524 mg   hydrOXYzine (ATARAX) tablet 25 mg   ondansetron (ZOFRAN) tablet 8 mg   polyethylene glycol (MIRALAX / GLYCOLAX) packet 17 g   senna (SENOKOT) tablet 8.6 mg   OLANZapine zydis (ZYPREXA) disintegrating tablet 5 mg   And   LORazepam (ATIVAN) tablet 1 mg   And   diphenhydrAMINE (BENADRYL) capsule 25 mg   haloperidol lactate (HALDOL) injection 5 mg   And   LORazepam (ATIVAN) injection 1 mg   And   diphenhydrAMINE (BENADRYL) injection 25 mg   nicotine polacrilex (NICORETTE) gum 2 mg   OLANZapine (ZYPREXA) tablet 7.5 mg       05/08/2023    1:37 PM 05/04/2023    3:39 PM 04/25/2023    3:11 PM  Depression screen PHQ 2/9  Decreased Interest 0 1 0  Down, Depressed, Hopeless 0 3 1  PHQ - 2 Score 0 4 1  Altered sleeping 1 3   Tired, decreased energy 0 3   Change in appetite 0 3   Feeling bad or failure about yourself  1 3   Trouble concentrating 0 3    Moving slowly or fidgety/restless 0 2   Suicidal thoughts 0 2   PHQ-9 Score 2 23   Difficult doing work/chores Not difficult at all Extremely dIfficult     Flowsheet Row ED from 05/04/2023 in [facility] ED from 05/03/2023 in [facility] ED from 05/02/2023 in [facility] Emergency Department at [facility]  C-SSRS RISK CATEGORY High  Risk High Risk Error: Question 6 not populated       Musculoskeletal  Strength & Muscle Tone: within normal limits Gait & Station: normal Patient leans: N/A  Psychiatric Specialty Exam  Presentation  General Appearance:  Appropriate for Environment  Eye Contact: Good  Speech: Clear and Coherent  Speech Volume: Normal  Handedness: Right   Mood and Affect  Mood: Euthymic  Affect: Appropriate; Congruent   Thought Process  Thought Processes: Coherent; Goal Directed  Descriptions of Associations:Intact  Orientation:Full (Time, Place and Person)  Thought Content:Logical   Hallucinations:Hallucinations: None  Ideas of Reference:None  Suicidal Thoughts:Suicidal Thoughts: No  Homicidal Thoughts:Homicidal Thoughts: No   Sensorium  Memory: Immediate Fair  Judgment: Fair  Insight: Fair   Art therapist  Concentration: Fair  Attention Span: Fair  Recall: Fair  Fund of Knowledge: Fair  Language: Fair   Psychomotor Activity  Psychomotor Activity:Psychomotor Activity: Normal   Assets  Assets: Communication Skills; Desire for Improvement; Resilience   Sleep  Sleep:Sleep: Good   No data recorded  Physical Exam  Physical Exam Constitutional:      Appearance: Normal appearance.  HENT:     Head: Normocephalic and atraumatic.  Pulmonary:     Effort: Pulmonary effort is normal.  Neurological:     General: No focal deficit present.     Mental Status: He is alert.    Review of Systems  Constitutional: Negative.   HENT:  Negative.    Eyes: Negative.   Respiratory: Negative.    Cardiovascular: Negative.   Gastrointestinal: Negative.   Genitourinary: Negative.   Musculoskeletal: Negative.   Skin: Negative.   Neurological: Negative.   Endo/Heme/Allergies: Negative.    Blood pressure 109/74, pulse 70, temperature 97.6 F (36.4 C), temperature source Oral, resp. rate 16, SpO2 99%. There is no height or weight on file to calculate BMI.  Demographic Factors:  Male and Low socioeconomic status  Loss Factors: Financial problems/change in socioeconomic status  Historical Factors: Prior suicide attempts and Impulsivity  Risk Reduction Factors:   Positive social support  Continued Clinical Symptoms:  More than one psychiatric diagnosis Previous Psychiatric Diagnoses and Treatments  Cognitive Features That Contribute To Risk:  Thought constriction (tunnel vision)    Suicide Risk:  Mild:  There are no identifiable plans, no associated intent, mild dysphoria and related symptoms, good self-control (both objective and subjective assessment), few other risk factors, and identifiable protective factors, including available and accessible social support.  Plan Of Care/Follow-up recommendations:  Activity: as tolerated  Diet: heart healthy  Other: -Follow-up with the rehab facility  -Take your psychiatric medications as prescribed at discharge - instructions are provided to you in the discharge paperwork  -Follow-up with outpatient primary care doctor and other specialists -for management of preventative medicine and chronic medical disease -HIV  -Testing: Follow-up with outpatient provider for abnormal lab results:  Tbili 1.9  -If you are prescribed an atypical antipsychotic medication, we recommend that your outpatient psychiatrist follow routine screening for side effects within 3 months of discharge, including monitoring: AIMS scale, height, weight, blood pressure, fasting lipid panel, HbA1c, and  fasting blood sugar.   -Recommend total abstinence from alcohol, tobacco, and other illicit drug use at discharge.   -If your psychiatric symptoms recur, worsen, or if you have side effects to your psychiatric medications, call your outpatient psychiatric provider, 911, [phone] or go to the nearest emergency department.  -If suicidal thoughts occur, immediately call your outpatient psychiatric provider, 911, [phone] or go to the  nearest emergency department.   Disposition: [employer]  Karie Fetch, MD, PGY-2 05/08/2023, 1:46 PM

## 2023-05-08 NOTE — ED Notes (Signed)
Patient in the bedroom calm and sleeping. NAD,respirations are even and unlabored. Will continue to monitor for safety.

## 2023-05-08 NOTE — ED Notes (Signed)
Patient remains asleep in bed without issue or complaint.  No distress noted.  Patient is scheduled for discharge to [facility] later this morning.  Will monitor

## 2023-05-24 ENCOUNTER — Other Ambulatory Visit: Payer: Self-pay

## 2023-05-24 ENCOUNTER — Other Ambulatory Visit: Payer: Self-pay | Admitting: Internal Medicine

## 2023-05-24 ENCOUNTER — Other Ambulatory Visit (HOSPITAL_COMMUNITY): Payer: Self-pay

## 2023-05-24 DIAGNOSIS — G40909 Epilepsy, unspecified, not intractable, without status epilepticus: Secondary | ICD-10-CM

## 2023-05-24 NOTE — Telephone Encounter (Signed)
Were you okay with refilling? Wanted to double check since one Rx from the ED is for 1,000 mg BID and one is for 1,500 mg BID.   Thanks!

## 2023-05-29 IMAGING — CT CT HEAD W/O CM
3 series · 15 of 47 positions shown, 18 images · non-contrast
Comparison: June 25, 2020

CLINICAL DATA: Seizure, nontraumatic (Age 18-40y)

EXAM:
CT HEAD WITHOUT CONTRAST
TECHNIQUE: Contiguous axial images were obtained from the base of the skull
through the vertex without intravenous contrast.

[Series 2: head wo · axial · 0.47mm/px · z∈[+1404,+1544]mm · 9 of 34 slices shown, 12 images]
[im 3/34  brain]
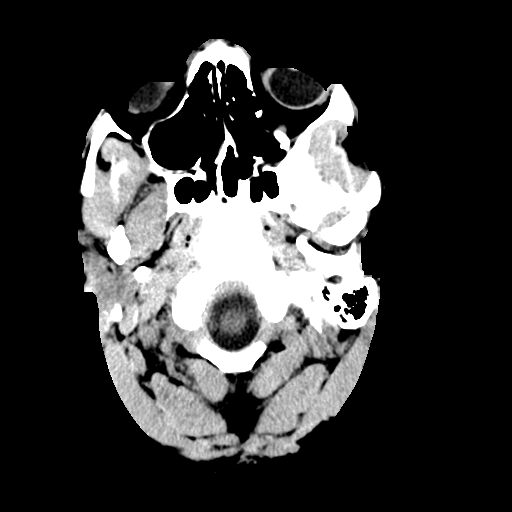
[im 3/34  bone]
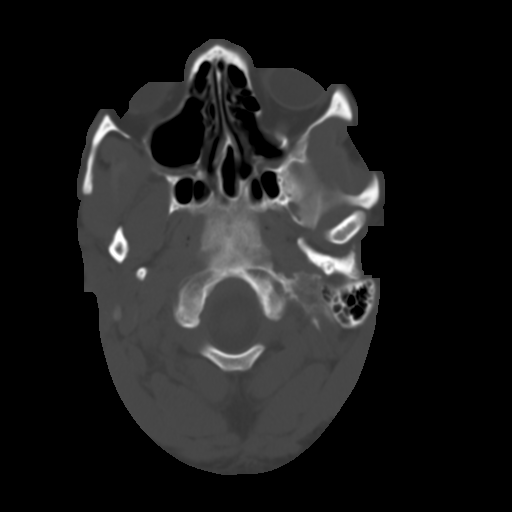
[im 6/34  brain]
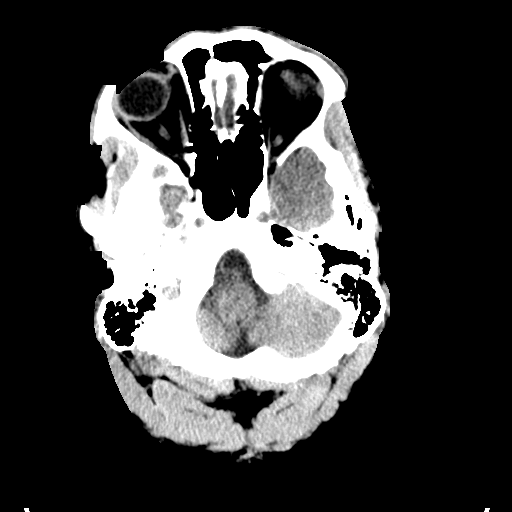
[im 10/34  brain]
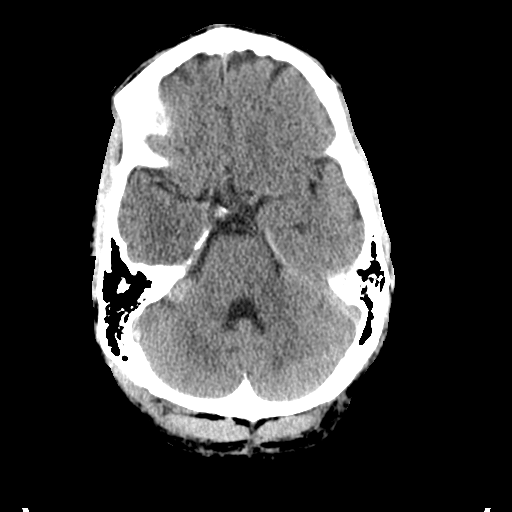
[im 13/34  brain]
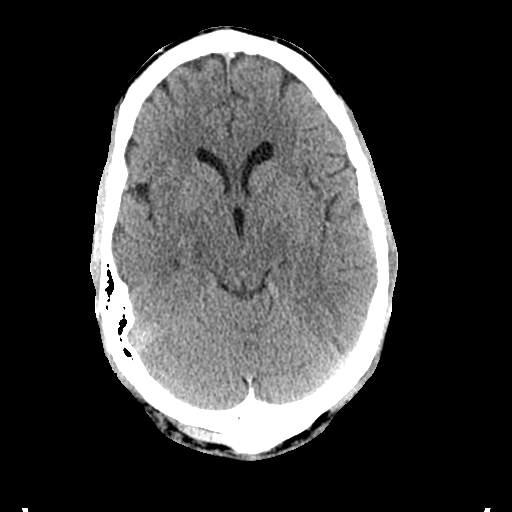
[im 18/34  brain]
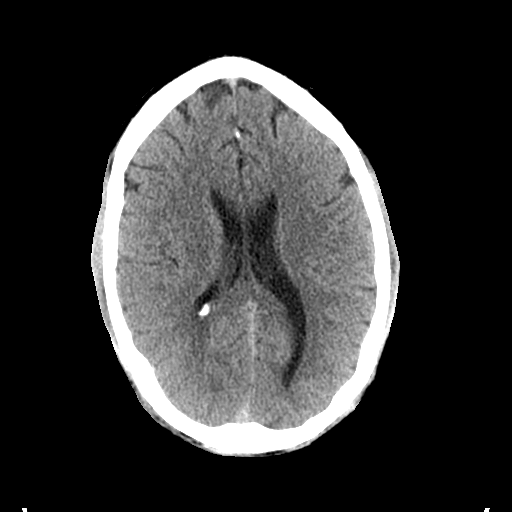
[im 18/34  bone]
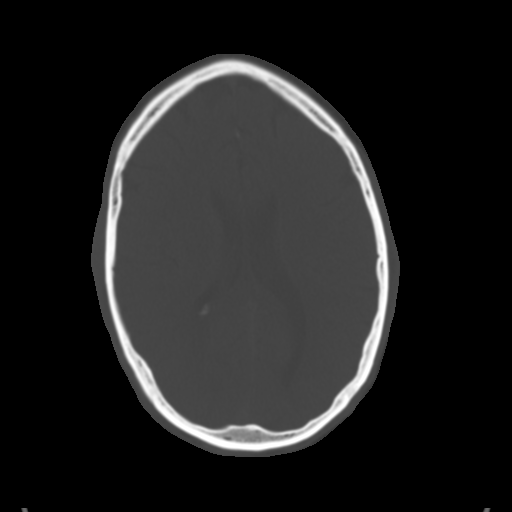
[im 21/34  brain]
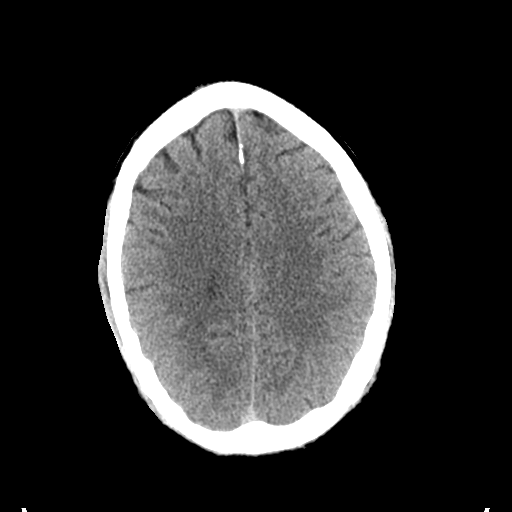
[im 24/34  brain]
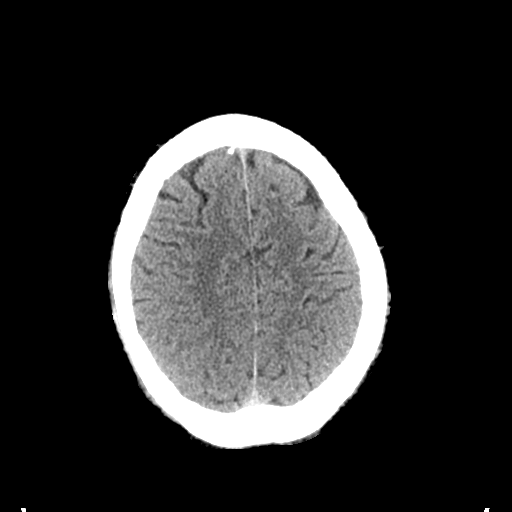
[im 28/34  brain]
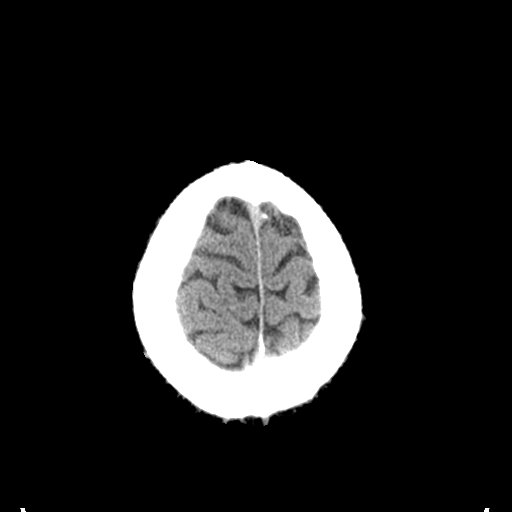
[im 31/34  brain]
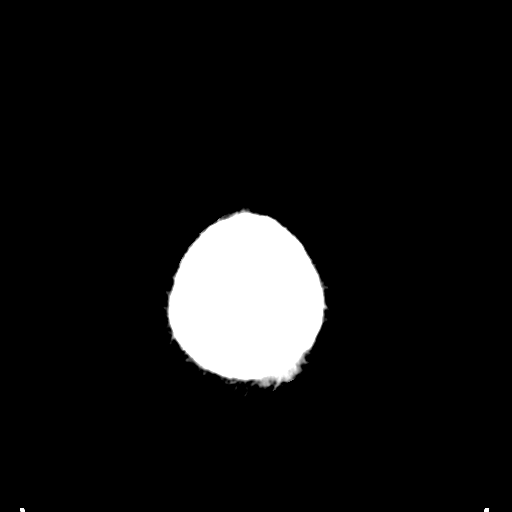
[im 31/34  bone]
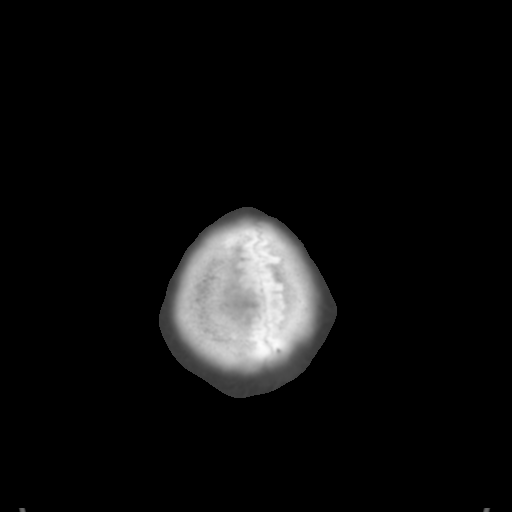

[Series 5: coronal soft tissue · coronal · 0.34mm/px · 3 of 82 slices shown]
[im 28/82  brain]
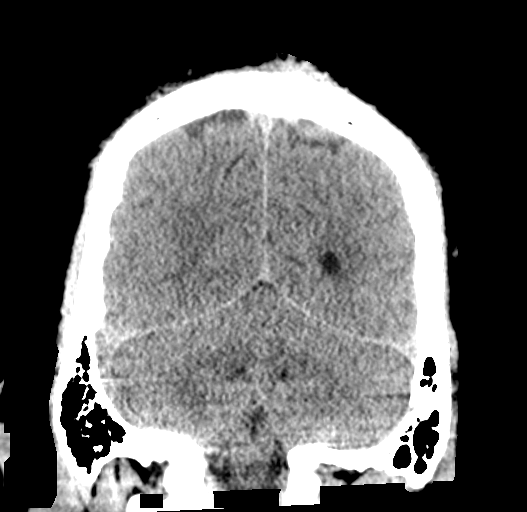
[im 37/82  brain]
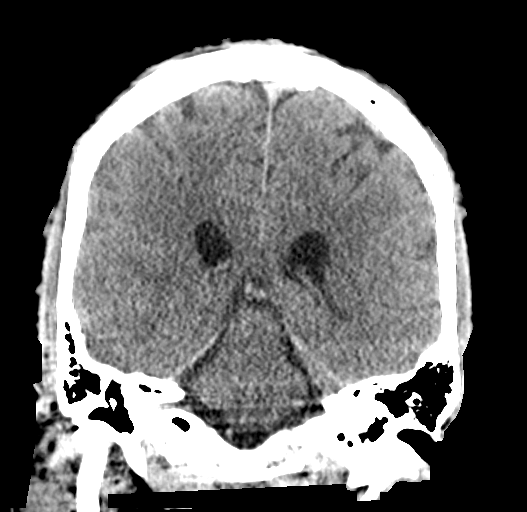
[im 46/82  brain]
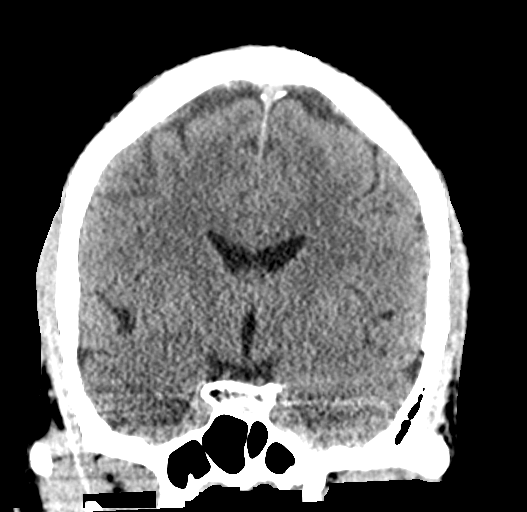

[Series 6: sagittal soft tissue · sagittal · 0.34mm/px · 3 of 60 slices shown]
[im 20/60  brain]
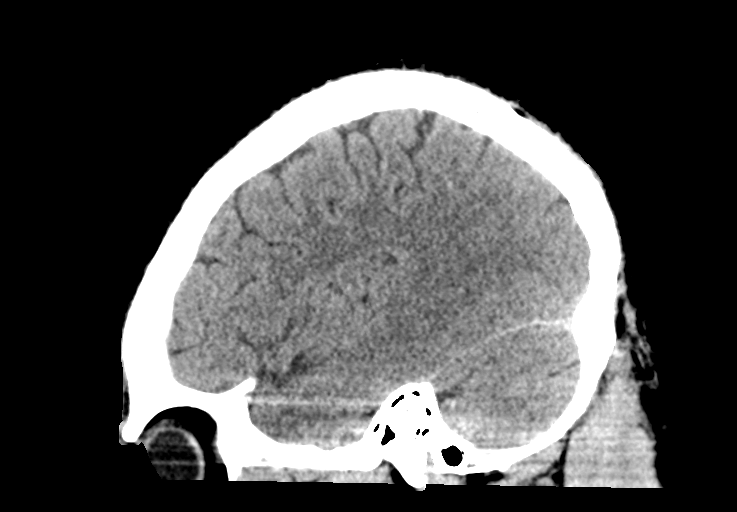
[im 30/60  brain]
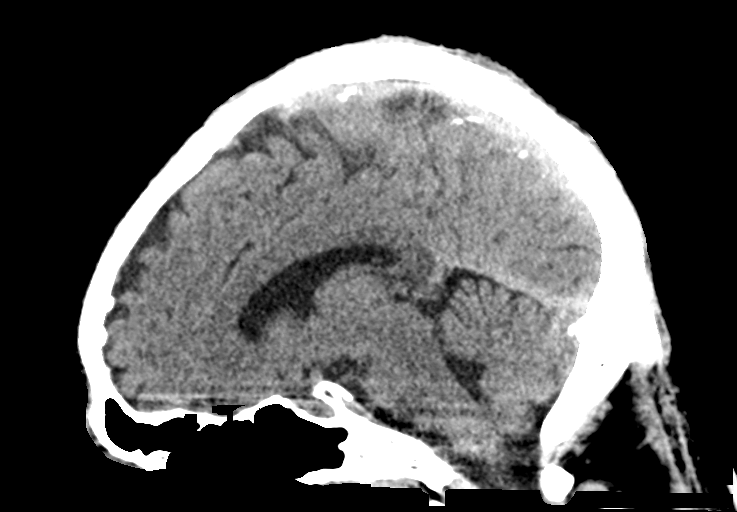
[im 40/60  brain]
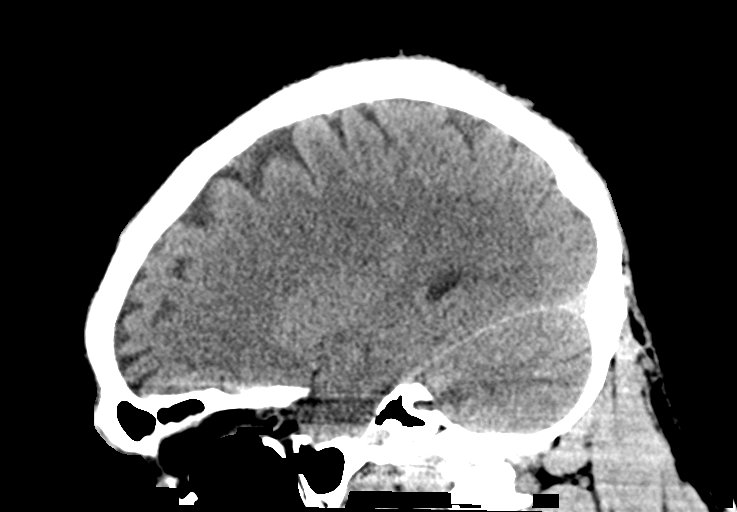

[15 of 47 positions shown; findings below may reference images not displayed]

FINDINGS: Brain: No evidence of acute infarction, hemorrhage, hydrocephalus,
extra-axial collection or mass lesion/mass effect.

Vascular: No hyperdense vessel or unexpected calcification.

Skull: Normal. Negative for fracture or focal lesion.

Sinuses/Orbits: No acute finding.

Other: None.
IMPRESSION: No acute intracranial abnormality.

## 2023-05-30 ENCOUNTER — Ambulatory Visit: Payer: MEDICAID | Admitting: Internal Medicine

## 2023-05-31 ENCOUNTER — Ambulatory Visit: Payer: MEDICAID | Admitting: Internal Medicine

## 2023-06-05 ENCOUNTER — Ambulatory Visit (HOSPITAL_COMMUNITY): Admission: EM | Admit: 2023-06-05 | Discharge: 2023-06-08 | Payer: MEDICAID | Attending: Family | Admitting: Family

## 2023-06-05 DIAGNOSIS — F1590 Other stimulant use, unspecified, uncomplicated: Secondary | ICD-10-CM | POA: Insufficient documentation

## 2023-06-05 DIAGNOSIS — Z5900 Homelessness unspecified: Secondary | ICD-10-CM | POA: Insufficient documentation

## 2023-06-05 DIAGNOSIS — F259 Schizoaffective disorder, unspecified: Secondary | ICD-10-CM | POA: Insufficient documentation

## 2023-06-05 DIAGNOSIS — Z79899 Other long term (current) drug therapy: Secondary | ICD-10-CM | POA: Insufficient documentation

## 2023-06-05 DIAGNOSIS — F1910 Other psychoactive substance abuse, uncomplicated: Secondary | ICD-10-CM

## 2023-06-05 DIAGNOSIS — F1914 Other psychoactive substance abuse with psychoactive substance-induced mood disorder: Secondary | ICD-10-CM | POA: Insufficient documentation

## 2023-06-05 DIAGNOSIS — F1994 Other psychoactive substance use, unspecified with psychoactive substance-induced mood disorder: Secondary | ICD-10-CM

## 2023-06-05 DIAGNOSIS — R45851 Suicidal ideations: Secondary | ICD-10-CM | POA: Insufficient documentation

## 2023-06-05 DIAGNOSIS — F1491 Cocaine use, unspecified, in remission: Secondary | ICD-10-CM | POA: Insufficient documentation

## 2023-06-05 DIAGNOSIS — Z9151 Personal history of suicidal behavior: Secondary | ICD-10-CM | POA: Insufficient documentation

## 2023-06-05 NOTE — Progress Notes (Signed)
   06/05/23 2207  [facility] Triage Screening (Walk-ins at [facility] only)  How Did You Hear About [place]? Self  What Is the Reason for Your Visit/Call Today? Pt presents to [facility] as a voluntary walk-in, with complaint of AVH, SI, with a plan to overdose on Tylenol PM.Pt reports that he has experienced a number of "episodes" or "blackouts" where he does not remember what has occurred. Pt reports history of depression, anxiety and Schizophrenia. Pt only reports taking Keppra at this time for epilepsy/seizure disorder. Pt reports hearing a voice whom he calls "Ms.Pam", and he describes this individual telling him that the blood of Jesus is not real and that she is who he should be worshipping. Pt reports experiencing visual hallucinations a few days ago, where he experienced seeing demonic faces. Pt denies current alcohol and drug use.  How Long Has This Been Causing You Problems? <Week  Have You Recently Had Any Thoughts About Hurting Yourself? Yes  How long ago did you have thoughts about hurting yourself? currently  Are You Planning to Commit Suicide/Harm Yourself At This time? Yes  Have you Recently Had Thoughts About Hurting Someone [name]? Yes  How long ago did you have thoughts of harming others? due to hearing voices  Are You Planning To Harm Someone At This Time? No  Physical Abuse Denies  Verbal Abuse Denies  Sexual Abuse Denies  Exploitation of patient/patient's resources Denies  Self-Neglect Denies  Are you currently experiencing any auditory, visual or other hallucinations? Yes  Please explain the hallucinations you are currently experiencing: Pt reports AVH for a few weeks  Have You Used Any Alcohol or Drugs in the Past 24 Hours? No  How long ago did you use Drugs or Alcohol? pt denies  What Did You Use and How Much? pt denies  Do you have any current medical co-morbidities that require immediate attention? No  Clinician description of patient physical appearance/behavior: Cooperative, pleasant,  casually dressed  What Do You Feel Would Help You the Most Today? Treatment for Depression or other mood problem  If access to [facility] was not available, would you have sought care in the Emergency Department? Yes  Determination of Need Urgent (48 hours)  Options For Referral Other: Comment;Outpatient Therapy;Medication Management;BH Urgent Care;Inpatient Hospitalization  Determination of Need filed? Yes

## 2023-06-06 ENCOUNTER — Encounter (HOSPITAL_COMMUNITY): Payer: Self-pay

## 2023-06-06 DIAGNOSIS — F1590 Other stimulant use, unspecified, uncomplicated: Secondary | ICD-10-CM | POA: Diagnosis not present

## 2023-06-06 DIAGNOSIS — F1914 Other psychoactive substance abuse with psychoactive substance-induced mood disorder: Secondary | ICD-10-CM | POA: Diagnosis not present

## 2023-06-06 DIAGNOSIS — F259 Schizoaffective disorder, unspecified: Secondary | ICD-10-CM | POA: Diagnosis not present

## 2023-06-06 DIAGNOSIS — F1491 Cocaine use, unspecified, in remission: Secondary | ICD-10-CM | POA: Diagnosis not present

## 2023-06-06 LAB — COMPREHENSIVE METABOLIC PANEL
ALT: 40 U/L (ref 0–44)
AST: 29 U/L (ref 15–41)
Albumin: 4 g/dL (ref 3.5–5.0)
Alkaline Phosphatase: 86 U/L (ref 38–126)
Anion gap: 9 (ref 5–15)
BUN: 9 mg/dL (ref 6–20)
CO2: 28 mmol/L (ref 22–32)
Calcium: 9.9 mg/dL (ref 8.9–10.3)
Chloride: 106 mmol/L (ref 98–111)
Creatinine, Ser: 1.14 mg/dL (ref 0.61–1.24)
GFR, Estimated: >60 mL/min (ref >60)
Glucose, Bld: 94 mg/dL (ref 70–99)
Potassium: 4.7 mmol/L (ref 3.5–5.1)
Sodium: 143 mmol/L (ref 135–145)
Total Bilirubin: 0.8 mg/dL (ref <1.2)
Total Protein: 7.5 g/dL (ref 6.5–8.1)

## 2023-06-06 LAB — CBC WITH DIFFERENTIAL/PLATELET
Abs Immature Granulocytes: 0.01 10*3/uL (ref 0.00–0.07)
Basophils Absolute: 0 10*3/uL (ref 0.0–0.1)
Basophils Relative: 0 %
Eosinophils Absolute: 0.1 10*3/uL (ref 0.0–0.5)
Eosinophils Relative: 1 %
HCT: 50.5 % (ref 39.0–52.0)
Hemoglobin: 16.9 g/dL (ref 13.0–17.0)
Immature Granulocytes: 0 %
Lymphocytes Relative: 31 %
Lymphs Abs: 2.4 10*3/uL (ref 0.7–4.0)
MCH: 32.3 pg (ref 26.0–34.0)
MCHC: 33.5 g/dL (ref 30.0–36.0)
MCV: 96.6 fL (ref 80.0–100.0)
Monocytes Absolute: 0.6 10*3/uL (ref 0.1–1.0)
Monocytes Relative: 8 %
Neutro Abs: 4.6 10*3/uL (ref 1.7–7.7)
Neutrophils Relative %: 60 %
Platelets: 305 10*3/uL (ref 150–400)
RBC: 5.23 MIL/uL (ref 4.22–5.81)
RDW: 11.3 % — ABNORMAL LOW (ref 11.5–15.5)
WBC: 7.7 10*3/uL (ref 4.0–10.5)
nRBC: 0 % (ref 0.0–0.2)

## 2023-06-06 LAB — POCT URINE DRUG SCREEN - MANUAL ENTRY (I-SCREEN)
POC Amphetamine UR: NOT DETECTED
POC Buprenorphine (BUP): NOT DETECTED
POC Cocaine UR: NOT DETECTED
POC Marijuana UR: NOT DETECTED
POC Methadone UR: NOT DETECTED
POC Methamphetamine UR: NOT DETECTED
POC Morphine: NOT DETECTED
POC Oxazepam (BZO): NOT DETECTED
POC Oxycodone UR: NOT DETECTED
POC Secobarbital (BAR): NOT DETECTED

## 2023-06-06 MED ORDER — LEVETIRACETAM 500 MG PO TABS
1500.0000 mg | ORAL_TABLET | Freq: Two times a day (BID) | ORAL | Status: DC
  Administered 2023-06-06 – 2023-06-08 (×5): 1500 mg via ORAL
  Filled 2023-06-06 (×5): qty 3

## 2023-06-06 MED ORDER — DICYCLOMINE HCL 20 MG PO TABS: 20.0000 mg | ORAL_TABLET | Freq: Four times a day (QID) | ORAL | Status: DC | PRN

## 2023-06-06 MED ORDER — ESCITALOPRAM OXALATE 10 MG PO TABS
20.0000 mg | ORAL_TABLET | Freq: Every day | ORAL | Status: DC
  Administered 2023-06-06 – 2023-06-08 (×3): 20 mg via ORAL
  Filled 2023-06-06 (×3): qty 2

## 2023-06-06 MED ORDER — BICTEGRAVIR-EMTRICITAB-TENOFOV 50-200-25 MG PO TABS
1.0000 | ORAL_TABLET | Freq: Every day | ORAL | Status: DC
  Administered 2023-06-06 – 2023-06-08 (×3): 1 via ORAL
  Filled 2023-06-06 (×3): qty 1

## 2023-06-06 MED ORDER — HYDROXYZINE HCL 25 MG PO TABS: 25.0000 mg | ORAL_TABLET | Freq: Four times a day (QID) | ORAL | Status: DC | PRN

## 2023-06-06 MED ORDER — MAGNESIUM HYDROXIDE 400 MG/5ML PO SUSP: 15.0000 mL | Freq: Every day | ORAL | Status: DC | PRN

## 2023-06-06 MED ORDER — NAPROXEN 500 MG PO TABS
500.0000 mg | ORAL_TABLET | Freq: Two times a day (BID) | ORAL | Status: DC | PRN
  Administered 2023-06-06 – 2023-06-07 (×3): 500 mg via ORAL
  Filled 2023-06-06 (×3): qty 1

## 2023-06-06 MED ORDER — LOPERAMIDE HCL 2 MG PO CAPS: 2.0000 mg | ORAL_CAPSULE | ORAL | Status: DC | PRN

## 2023-06-06 MED ORDER — ALUM & MAG HYDROXIDE-SIMETH 200-200-20 MG/5ML PO SUSP: 15.0000 mL | Freq: Four times a day (QID) | ORAL | Status: DC | PRN

## 2023-06-06 MED ORDER — TRAZODONE HCL 50 MG PO TABS
50.0000 mg | ORAL_TABLET | Freq: Every evening | ORAL | Status: DC | PRN
  Administered 2023-06-06 (×2): 50 mg via ORAL
  Filled 2023-06-06 (×2): qty 1

## 2023-06-06 MED ORDER — TRAZODONE HCL 50 MG PO TABS
50.0000 mg | ORAL_TABLET | Freq: Once | ORAL | Status: AC
  Administered 2023-06-06: 50 mg via ORAL
  Filled 2023-06-06: qty 1

## 2023-06-06 MED ORDER — METHOCARBAMOL 500 MG PO TABS
500.0000 mg | ORAL_TABLET | Freq: Three times a day (TID) | ORAL | Status: DC | PRN
  Administered 2023-06-06: 500 mg via ORAL
  Filled 2023-06-06: qty 1

## 2023-06-06 MED ORDER — OLANZAPINE 10 MG PO TABS: 10.0000 mg | ORAL_TABLET | Freq: Every day | ORAL | Status: DC

## 2023-06-06 MED ORDER — NICOTINE 14 MG/24HR TD PT24
14.0000 mg | MEDICATED_PATCH | Freq: Every day | TRANSDERMAL | Status: DC
  Administered 2023-06-06 – 2023-06-08 (×3): 14 mg via TRANSDERMAL
  Filled 2023-06-06 (×3): qty 1

## 2023-06-06 MED ORDER — OLANZAPINE 7.5 MG PO TABS
7.5000 mg | ORAL_TABLET | Freq: Every day | ORAL | Status: DC
  Administered 2023-06-06 – 2023-06-07 (×3): 7.5 mg via ORAL
  Filled 2023-06-06 (×3): qty 1

## 2023-06-06 MED ORDER — ACETAMINOPHEN 325 MG PO TABS
650.0000 mg | ORAL_TABLET | Freq: Four times a day (QID) | ORAL | Status: DC | PRN
Start: 2023-06-06 — End: 2023-06-08

## 2023-06-06 MED ORDER — ONDANSETRON 4 MG PO TBDP: 4.0000 mg | ORAL_TABLET | Freq: Four times a day (QID) | ORAL | Status: DC | PRN

## 2023-06-06 NOTE — ED Notes (Signed)
Pt states that he still isn't feeling himself today but he knows he has come to the right place to get help. Pt denies SI, HI, AVH. Contracts for safety. Rates pain 10/10 as neck and back pain, chronic. Pt endorses anxiety and depression. Worried about his missed disability appointment. States that the appointment can be rescheduled for Saturday. Wanted to know if he could attend the in-person appointment and then resume treatment here. Pt encouraged to reach out to SW and provider in the morning to find out his options. Pt endorses poor sleep last night. Provider notified. No other complaints at this time.
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
## 2023-06-06 NOTE — ED Notes (Signed)
Pt lying on bed with eyes closed. Easily aroused to name being called. Denies SI/HI/AVH. No acute distress noted. No concerns voiced. Informed pt to notify staff with any needs or assistance. Pt verbalized understanding or agreement. Will continue to monitor for safety.

## 2023-06-06 NOTE — ED Notes (Signed)
Pt sleeping in no acute distress. RR even and unlabored. Environment secured. Will continue to monitor for safety. 

## 2023-06-06 NOTE — ED Notes (Signed)
Pt here voluntarily for help with SI. Pt passive SI but contracts for safety. States he had every intention of killing himself but God (pt points upwards) stopped him. Pt denies HI and pain. Endorses AVH a couple days ago where he was hearing voices telling him to harm himself. "I have a hard time telling what is real." Pt states that he stopped taking prescribed Zyprexa about 3 weeks ago. "Someone told me that I wasn't schizophrenic and didn't need it. They said my intelligence was too high to be schizophrenic." Pt takes prescribed Keppra for seizures. Last dose was at 1900 on Monday. Pt states he drinks alcohol occasionally with last drink 3 days ago and used Xanax 3 days ago as well. Pt is homeless with no social support. "My family has disowned me. I have a brother in [place] but he said I can't stay with him until I can prove that I have overcome my demons." Pt states he came in for help because he has been helped here before. Pt is calm and cooperative with assessment. States he had a disability appointment scheduled for last week but missed it. Pt encouraged to speak with social worker about it in the morning. Pt in Adult bed 1.

## 2023-06-06 NOTE — ED Notes (Signed)
Pt resting at this hour. No apparent distress. RR even and unlabored. Monitored for safety.  

## 2023-06-06 NOTE — ED Notes (Signed)
Pt asleep at this hour. No apparent distress. RR even and unlabored. Monitored for safety.  

## 2023-06-06 NOTE — ED Notes (Signed)
Pt c/o pain and discomfort in his neck and back Given PRN as appropriate.

## 2023-06-06 NOTE — ED Notes (Signed)
Pt playing cards with other patients at this hour. No apparent distress. RR even and unlabored. Monitored for safety.

## 2023-06-06 NOTE — ED Notes (Signed)
Pt states pain has now resolved. Rated 0/10. Pt asked for a snack and snack given.

## 2023-06-06 NOTE — ED Provider Notes (Signed)
[facility] Continuous Assessment Admission H&P  Date: 06/06/23 Patient Name: Justin [name] MRN: [id number] Chief Complaint: " A lot is going on".   Diagnoses:  Final diagnoses:  Substance induced mood disorder (HCC)  Polysubstance abuse (HCC)  Schizoaffective disorder, unspecified type (HCC)    HPI: Cutler D. Pilling is a 42 year old male with medical history of HIV and psychiatric history significant for polysubstance abuse, schizoaffective disorder, suicide attempt, hallucinations, MDD, severe behavior, and agitation,, who presented voluntarily as a walk-in to [facility] with complaints of AVH, and SI, with a plan to overdose on Tylenol PM.  Patient seen face-to-face by this provider and chart reviewed.Per chart review, patient has visited the ED/UC 22 times within the past six months with similar psychiatric complaints, most notably, substance abuse , and SI with plan/intent.  On evaluation, patient is alert, oriented x 4, and cooperative. Speech is clear, normal rate and coherent. Pt appears casually dressed. Eye contact is good. Mood is anxious and depressed, affect is congruent with mood. Thought process is coherent and thought content is WDL Pt endorses S with plan,passive HI, endorses AH, denies VH. There is no objective indication that the patient is responding to internal stimuli. No delusions elicited during this assessment.   Patient presents as very goal oriented and reports "a lot is going on, I have a lot of depression, regression, and hearing voices, and my substance abuse issues, I want to kill myself, hurt other people, I want help, and am trying to fight this battle, that's why I came here. I'm having memory issues, I can't remember where I've been, I need to get to a program where I can be helped or else I'm a end up overdosing on drugs".  In triage "Pt reported hearing a voice whom he calls "Ms.Pam", and he describes this individual telling him that the blood of Jesus  is not real and that she is who he should be worshipping. Pt reports experiencing visual hallucinations a few days ago, where he experienced seeing demonic faces".   Patient endorses illicit substance use and reports he drinks alcohol until he blacks out, his last drink was 3 days ago and he does not drink daily.  He also endorses methamphetamine use and reports last use was 3 days ago, quantity unknown.  He also reports abusing Klonopin and Xanax 2 pills each daily and last had any of these pills 3 days ago.  Patient reports he has been to a drug rehab facility [facility] but left 3 weeks ago because it did.work out. He reports being established with outpatient psychiatric services with Dr Audrie Lia at [facility] for infective diseases where he also gets his other life saving medications.  He is not linked to a therapist.   Patient reports "I need inpatient psychiatric admission, outpatient will not work because I have too much access to drugs, they just hand it to me. Patient endorses current homelessness.  Support, encouragement, reassurance provided about ongoing stressors.  Patient is provided with opportunity for questions.  Discussed recommendation for admission to inpatient psychiatric admission for stabilization and treatment.  Inpatient milieu and expectations discussed.   Patient will be admitted to the continuous observation unit for safety and exam pending transfer to an inpatient psychiatric unit.  He verbalizes understanding and is in agreement. .  Total Time spent with patient: 20 minutes  Musculoskeletal  Strength & Muscle Tone: within normal limits Gait & Station: normal Patient leans: N/A  Psychiatric Specialty Exam  Presentation General Appearance:  Casual  Eye Contact: Good  Speech: Clear and Coherent  Speech Volume: Normal  Handedness: Right   Mood and Affect  Mood: Depressed  Affect: Congruent   Thought Process  Thought  Processes: Goal Directed  Descriptions of Associations:Intact  Orientation:Full (Time, Place and Person)  Thought Content:WDL  Diagnosis of Schizophrenia or Schizoaffective disorder in past: Yes  Duration of Psychotic Symptoms: Greater [name] six months  Hallucinations:Hallucinations: Auditory Description of Auditory Hallucinations: Pt reports hearing voices that tell him wha to believe Description of Visual Hallucinations: none  Ideas of Reference:None  Suicidal Thoughts:Suicidal Thoughts: Yes, Active SI Active Intent and/or Plan: With Plan  Homicidal Thoughts:Homicidal Thoughts: Yes, Passive HI Passive Intent and/or Plan: With Intent   Sensorium  Memory: Immediate Fair  Judgment: Poor  Insight: Poor   Executive Functions  Concentration: Good  Attention Span: Good  Recall: Good  Fund of Knowledge: Good  Language: Good   Psychomotor Activity  Psychomotor Activity: Psychomotor Activity: Normal   Assets  Assets: Communication Skills; Desire for Improvement   Sleep  Sleep: Sleep: Fair   Nutritional Assessment (For OBS and FBC admissions only) Has the patient had a weight loss or gain of 10 pounds or more in the last 3 months?: No Has the patient had a decrease in food intake/or appetite?: No Does the patient have dental problems?: No Does the patient have eating habits or behaviors that may be indicators of an eating disorder including binging or inducing vomiting?: No Has the patient recently lost weight without trying?: 0 Has the patient been eating poorly because of a decreased appetite?: 0 Malnutrition Screening Tool Score: 0    Physical Exam Constitutional:      General: He is not in acute distress.    Appearance: He is not diaphoretic.  HENT:     Head: Normocephalic.     Right Ear: External ear normal.     Left Ear: External ear normal.     Nose: No congestion.  Eyes:     General:        Right eye: No discharge.        Left  eye: No discharge.  Cardiovascular:     Rate and Rhythm: Normal rate.  Pulmonary:     Effort: No respiratory distress.  Chest:     Chest wall: No tenderness.  Neurological:     Mental Status: He is alert and oriented to person, place, and time.  Psychiatric:        Attention and Perception: He perceives auditory hallucinations.        Mood and Affect: Mood is anxious and depressed. Affect is flat.        Speech: Speech normal.        Behavior: Behavior is cooperative.        Thought Content: Thought content includes homicidal and suicidal ideation. Thought content includes suicidal plan. Thought content does not include homicidal plan.    Review of Systems  Constitutional:  Negative for chills, diaphoresis and fever.  HENT:  Negative for congestion.   Eyes:  Negative for discharge.  Respiratory:  Negative for cough, hemoptysis and wheezing.   Cardiovascular:  Negative for chest pain and palpitations.  Gastrointestinal:  Negative for diarrhea, nausea and vomiting.  Neurological:  Negative for dizziness, seizures, loss of consciousness and headaches.  Psychiatric/Behavioral:  Positive for depression, hallucinations, substance abuse and suicidal ideas. The patient is nervous/anxious.     Blood pressure [name])  132/99, pulse 79, temperature 98.9 F (37.2 C), temperature source Oral, resp. rate 20, SpO2 99%. There is no height or weight on file to calculate BMI.  Past Psychiatric History: See H & P   Is the patient at risk to self? Yes  Has the patient been a risk to self in the past 6 months? Yes .    Has the patient been a risk to self within the distant past? Yes   Is the patient a risk to others? No   Has the patient been a risk to others in the past 6 months? No   Has the patient been a risk to others within the distant past? No   Past Medical History: See Chart  Family History: N/A  Social History: N/A  Last Labs:  Admission on 06/05/2023  Component Date Value Ref Range  Status   WBC 06/06/2023 7.7  4.0 - 10.5 K/uL Final   RBC 06/06/2023 5.23  4.22 - 5.81 MIL/uL Final   Hemoglobin 06/06/2023 16.9  13.0 - 17.0 g/dL Final   HCT 96/29/5284 50.5  39.0 - 52.0 % Final   MCV 06/06/2023 96.6  80.0 - 100.0 fL Final   MCH 06/06/2023 32.3  26.0 - 34.0 pg Final   MCHC 06/06/2023 33.5  30.0 - 36.0 g/dL Final   RDW 13/24/4010 11.3 (L)  11.5 - 15.5 % Final   Platelets 06/06/2023 305  150 - 400 K/uL Final   nRBC 06/06/2023 0.0  0.0 - 0.2 % Final   Neutrophils Relative % 06/06/2023 60  % Final   Neutro Abs 06/06/2023 4.6  1.7 - 7.7 K/uL Final   Lymphocytes Relative 06/06/2023 31  % Final   Lymphs Abs 06/06/2023 2.4  0.7 - 4.0 K/uL Final   Monocytes Relative 06/06/2023 8  % Final   Monocytes Absolute 06/06/2023 0.6  0.1 - 1.0 K/uL Final   Eosinophils Relative 06/06/2023 1  % Final   Eosinophils Absolute 06/06/2023 0.1  0.0 - 0.5 K/uL Final   Basophils Relative 06/06/2023 0  % Final   Basophils Absolute 06/06/2023 0.0  0.0 - 0.1 K/uL Final   Immature Granulocytes 06/06/2023 0  % Final   Abs Immature Granulocytes 06/06/2023 0.01  0.00 - 0.07 K/uL Final   Performed at [facility], [address] [place], [place] [address]   Sodium 06/06/2023 143  135 - 145 mmol/L Final   Potassium 06/06/2023 4.7  3.5 - 5.1 mmol/L Final   Chloride 06/06/2023 106  98 - 111 mmol/L Final   CO2 06/06/2023 28  22 - 32 mmol/L Final   Glucose, Bld 06/06/2023 94  70 - 99 mg/dL Final   Glucose reference range applies only to samples taken after fasting for at least 8 hours.   BUN 06/06/2023 9  6 - 20 mg/dL Final   Creatinine, Ser 06/06/2023 1.14  0.61 - 1.24 mg/dL Final   Calcium 66/44/0347 9.9  8.9 - 10.3 mg/dL Final   Total Protein 42/59/5638 7.5  6.5 - 8.1 g/dL Final   Albumin 75/64/3329 4.0  3.5 - 5.0 g/dL Final   AST 51/88/4166 29  15 - 41 U/L Final   ALT 06/06/2023 40  0 - 44 U/L Final   Alkaline Phosphatase 06/06/2023 86  38 - 126 U/L Final   Total Bilirubin 06/06/2023 0.8   <1.2 mg/dL Final   GFR, Estimated 06/06/2023 >60  >60 mL/min Final   Comment: (NOTE) Calculated using the CKD-EPI Creatinine Equation (2021)  Anion gap 06/06/2023 9  5 - 15 Final   Performed at [facility], [address], [place], [place] [address]   POC Amphetamine UR 06/06/2023 None Detected  NONE DETECTED (Cut Off Level 1000 ng/mL) Final   POC Secobarbital (BAR) 06/06/2023 None Detected  NONE DETECTED (Cut Off Level 300 ng/mL) Final   POC Buprenorphine (BUP) 06/06/2023 None Detected  NONE DETECTED (Cut Off Level 10 ng/mL) Final   POC Oxazepam (BZO) 06/06/2023 None Detected  NONE DETECTED (Cut Off Level 300 ng/mL) Final   POC Cocaine UR 06/06/2023 None Detected  NONE DETECTED (Cut Off Level 300 ng/mL) Final   POC Methamphetamine UR 06/06/2023 None Detected  NONE DETECTED (Cut Off Level 1000 ng/mL) Final   POC Morphine 06/06/2023 None Detected  NONE DETECTED (Cut Off Level 300 ng/mL) Final   POC Methadone UR 06/06/2023 None Detected  NONE DETECTED (Cut Off Level 300 ng/mL) Final   POC Oxycodone UR 06/06/2023 None Detected  NONE DETECTED (Cut Off Level 100 ng/mL) Final   POC Marijuana UR 06/06/2023 None Detected  NONE DETECTED (Cut Off Level 50 ng/mL) Final  Admission on 05/03/2023, Discharged on 05/04/2023  Component Date Value Ref Range Status   Alcohol, Ethyl (B) 05/04/2023 <10  <10 mg/dL Final   Comment: (NOTE) Lowest detectable limit for serum alcohol is 10 mg/dL.  For medical purposes only. Performed at [facility], [address], [place], [place] [address]   Admission on 05/02/2023, Discharged on 05/03/2023  Component Date Value Ref Range Status   WBC 05/02/2023 8.1  4.0 - 10.5 K/uL Final   RBC 05/02/2023 4.82  4.22 - 5.81 MIL/uL Final   Hemoglobin 05/02/2023 15.7  13.0 - 17.0 g/dL Final   HCT 36/64/4034 46.8  39.0 - 52.0 % Final   MCV 05/02/2023 97.1  80.0 - 100.0 fL Final   MCH 05/02/2023 32.6  26.0 - 34.0 pg Final   MCHC 05/02/2023 33.5  30.0 - 36.0 g/dL  Final   RDW 74/25/9563 11.9  11.5 - 15.5 % Final   Platelets 05/02/2023 206  150 - 400 K/uL Final   nRBC 05/02/2023 0.0  0.0 - 0.2 % Final   Neutrophils Relative % 05/02/2023 71  % Final   Neutro Abs 05/02/2023 5.7  1.7 - 7.7 K/uL Final   Lymphocytes Relative 05/02/2023 23  % Final   Lymphs Abs 05/02/2023 1.8  0.7 - 4.0 K/uL Final   Monocytes Relative 05/02/2023 6  % Final   Monocytes Absolute 05/02/2023 0.5  0.1 - 1.0 K/uL Final   Eosinophils Relative 05/02/2023 0  % Final   Eosinophils Absolute 05/02/2023 0.0  0.0 - 0.5 K/uL Final   Basophils Relative 05/02/2023 0  % Final   Basophils Absolute 05/02/2023 0.0  0.0 - 0.1 K/uL Final   Immature Granulocytes 05/02/2023 0  % Final   Abs Immature Granulocytes 05/02/2023 0.03  0.00 - 0.07 K/uL Final   Performed at [facility], [address], [place], [place] [address]   Sodium 05/02/2023 139  135 - 145 mmol/L Final   Potassium 05/02/2023 3.6  3.5 - 5.1 mmol/L Final   Chloride 05/02/2023 103  98 - 111 mmol/L Final   CO2 05/02/2023 25  22 - 32 mmol/L Final   Glucose, Bld 05/02/2023 112 (H)  70 - 99 mg/dL Final   Glucose reference range applies only to samples taken after fasting for at least 8 hours.   BUN 05/02/2023 14  6 -  20 mg/dL Final   Creatinine, Ser 05/02/2023 0.95  0.61 - 1.24 mg/dL Final   Calcium 78/29/5621 9.1  8.9 - 10.3 mg/dL Final   Total Protein 30/86/5784 7.4  6.5 - 8.1 g/dL Final   Albumin 69/62/9528 4.2  3.5 - 5.0 g/dL Final   AST 41/32/4401 25  15 - 41 U/L Final   ALT 05/02/2023 27  0 - 44 U/L Final   Alkaline Phosphatase 05/02/2023 68  38 - 126 U/L Final   Total Bilirubin 05/02/2023 1.9 (H)  0.3 - 1.2 mg/dL Final   GFR, Estimated 05/02/2023 >60  >60 mL/min Final   Comment: (NOTE) Calculated using the CKD-EPI Creatinine Equation (2021)    Anion gap 05/02/2023 11  5 - 15 Final   Performed at [facility], [address], [place], [place] [address]   Lipase 05/02/2023 33  11 - 51  U/L Final   Performed at [facility], [address], [place], [place] [address]   Levetiracetam Lvl 05/02/2023 12.2  10.0 - 40.0 ug/mL Final   Comment: (NOTE) Performed At: [facility] [address] [place], [place] [id number] Jolene Schimke MD GL:[id number]   Admission on 04/30/2023, Discharged on 05/02/2023  Component Date Value Ref Range Status   Sodium 04/30/2023 137  135 - 145 mmol/L Final   Potassium 04/30/2023 3.5  3.5 - 5.1 mmol/L Final   Chloride 04/30/2023 101  98 - 111 mmol/L Final   CO2 04/30/2023 20 (L)  22 - 32 mmol/L Final   Glucose, Bld 04/30/2023 88  70 - 99 mg/dL Final   Glucose reference range applies only to samples taken after fasting for at least 8 hours.   BUN 04/30/2023 26 (H)  6 - 20 mg/dL Final   Creatinine, Ser 04/30/2023 1.21  0.61 - 1.24 mg/dL Final   Calcium 18/84/1660 9.3  8.9 - 10.3 mg/dL Final   Total Protein 63/07/6008 7.8  6.5 - 8.1 g/dL Final   Albumin 93/23/5573 4.6  3.5 - 5.0 g/dL Final   AST 22/08/5425 53 (H)  15 - 41 U/L Final   ALT 04/30/2023 37  0 - 44 U/L Final   Alkaline Phosphatase 04/30/2023 81  38 - 126 U/L Final   Total Bilirubin 04/30/2023 2.8 (H)  0.3 - 1.2 mg/dL Final   GFR, Estimated 04/30/2023 >60  >60 mL/min Final   Comment: (NOTE) Calculated using the CKD-EPI Creatinine Equation (2021)    Anion gap 04/30/2023 16 (H)  5 - 15 Final   Performed at [facility], [address] [place], [place] [address]   WBC 04/30/2023 10.2  4.0 - 10.5 K/uL Final   RBC 04/30/2023 4.92  4.22 - 5.81 MIL/uL Final   Hemoglobin 04/30/2023 15.9  13.0 - 17.0 g/dL Final   HCT 62/83/1517 47.6  39.0 - 52.0 % Final   MCV 04/30/2023 96.7  80.0 - 100.0 fL Final   MCH 04/30/2023 32.3  26.0 - 34.0 pg Final   MCHC 04/30/2023 33.4  30.0 - 36.0 g/dL Final   RDW 61/60/7371 12.1  11.5 - 15.5 % Final   Platelets 04/30/2023 218  150 - 400 K/uL Final   nRBC 04/30/2023 0.0  0.0 - 0.2 % Final   Neutrophils Relative  % 04/30/2023 64  % Final   Neutro Abs 04/30/2023 6.5  1.7 - 7.7 K/uL Final   Lymphocytes Relative 04/30/2023 25  % Final   Lymphs Abs 04/30/2023 2.5  0.7 - 4.0 K/uL Final  Monocytes Relative 04/30/2023 11  % Final   Monocytes Absolute 04/30/2023 1.1 (H)  0.1 - 1.0 K/uL Final   Eosinophils Relative 04/30/2023 0  % Final   Eosinophils Absolute 04/30/2023 0.0  0.0 - 0.5 K/uL Final   Basophils Relative 04/30/2023 0  % Final   Basophils Absolute 04/30/2023 0.0  0.0 - 0.1 K/uL Final   Immature Granulocytes 04/30/2023 0  % Final   Abs Immature Granulocytes 04/30/2023 0.03  0.00 - 0.07 K/uL Final   Performed at [facility], [address] [place], [place] [address]   Opiates 04/30/2023 NONE DETECTED  NONE DETECTED Final   Cocaine 04/30/2023 NONE DETECTED  NONE DETECTED Final   Benzodiazepines 04/30/2023 NONE DETECTED  NONE DETECTED Final   Amphetamines 04/30/2023 POSITIVE (A)  NONE DETECTED Final   Tetrahydrocannabinol 04/30/2023 NONE DETECTED  NONE DETECTED Final   Barbiturates 04/30/2023 NONE DETECTED  NONE DETECTED Final   Comment: (NOTE) DRUG SCREEN FOR MEDICAL PURPOSES ONLY.  IF CONFIRMATION IS NEEDED FOR ANY PURPOSE, NOTIFY LAB WITHIN 5 DAYS.  LOWEST DETECTABLE LIMITS FOR URINE DRUG SCREEN Drug Class                     Cutoff (ng/mL) Amphetamine and metabolites    1000 Barbiturate and metabolites    200 Benzodiazepine                 200 Opiates and metabolites        300 Cocaine and metabolites        300 THC                            50 Performed at [facility], [address], [place], [place] [address]    Color, Urine 04/30/2023 YELLOW  YELLOW Final   APPearance 04/30/2023 CLEAR  CLEAR Final   Specific Gravity, Urine 04/30/2023 1.027  1.005 - 1.030 Final   pH 04/30/2023 5.0  5.0 - 8.0 Final   Glucose, UA 04/30/2023 NEGATIVE  NEGATIVE mg/dL Final   Hgb urine dipstick 04/30/2023 NEGATIVE  NEGATIVE Final   Bilirubin Urine  04/30/2023 NEGATIVE  NEGATIVE Final   Ketones, ur 04/30/2023 20 (A)  NEGATIVE mg/dL Final   Protein, ur 65/78/4696 NEGATIVE  NEGATIVE mg/dL Final   Nitrite 29/52/8413 NEGATIVE  NEGATIVE Final   Leukocytes,Ua 04/30/2023 NEGATIVE  NEGATIVE Final   RBC / HPF 04/30/2023 0-5  0 - 5 RBC/hpf Final   WBC, UA 04/30/2023 0-5  0 - 5 WBC/hpf Final   Bacteria, UA 04/30/2023 RARE (A)  NONE SEEN Final   Squamous Epithelial / HPF 04/30/2023 0-5  0 - 5 /HPF Final   Mucus 04/30/2023 PRESENT   Final   Performed at [facility], [address], [place], [place] [address]  Office Visit on 04/25/2023  Component Date Value Ref Range Status   HIV 1 RNA Quant 04/25/2023 Not Detected  Copies/mL Final   HIV-1 RNA Quant, Log 04/25/2023 Not Detected  Log cps/mL Final   Comment: . Reference Range:                           Not Detected     copies/mL                           Not Detected Log copies/mL . [name] The test  was performed using Real-Time Polymerase Chain Reaction. . . Reportable Range: 20 copies/mL to 10,000,000 copies/mL (1.30 Log copies/mL to 7.00 Log copies/mL). .    CD4 T Cell Abs 04/25/2023 537  400 - 1,790 /uL Final   CD4 % Helper T Cell 04/25/2023 31 (L)  33 - 65 % Final   Performed at [facility], [address] [place], [place] [address]   RPR Ser Ql 04/25/2023 NON-REACTIVE  NON-REACTIVE Final   Comment: . No laboratory evidence of syphilis. If recent exposure is suspected, submit a new sample in 2-4 weeks. .   Admission on 04/07/2023, Discharged on 04/08/2023  Component Date Value Ref Range Status   Glucose-Capillary 04/07/2023 126 (H)  70 - 99 mg/dL Final   Glucose reference range applies only to samples taken after fasting for at least 8 hours.   Sodium 04/07/2023 142  135 - 145 mmol/L Final   Potassium 04/07/2023 3.9  3.5 - 5.1 mmol/L Final   Chloride 04/07/2023 104  98 - 111 mmol/L Final   CO2 04/07/2023 26  22 - 32 mmol/L Final   Glucose,  Bld 04/07/2023 92  70 - 99 mg/dL Final   Glucose reference range applies only to samples taken after fasting for at least 8 hours.   BUN 04/07/2023 10  6 - 20 mg/dL Final   Creatinine, Ser 04/07/2023 0.97  0.61 - 1.24 mg/dL Final   Calcium 19/14/7829 9.6  8.9 - 10.3 mg/dL Final   Total Protein 56/21/3086 8.0  6.5 - 8.1 g/dL Final   Albumin 57/84/6962 4.4  3.5 - 5.0 g/dL Final   AST 95/28/4132 25  15 - 41 U/L Final   ALT 04/07/2023 33  0 - 44 U/L Final   Alkaline Phosphatase 04/07/2023 85  38 - 126 U/L Final   Total Bilirubin 04/07/2023 1.9 (H)  0.3 - 1.2 mg/dL Final   GFR, Estimated 04/07/2023 >60  >60 mL/min Final   Comment: (NOTE) Calculated using the CKD-EPI Creatinine Equation (2021)    Anion gap 04/07/2023 12  5 - 15 Final   Performed at [facility], [address], [place], [place] [address]   Alcohol, Ethyl (B) 04/07/2023 <10  <10 mg/dL Final   Comment: (NOTE) Lowest detectable limit for serum alcohol is 10 mg/dL.  For medical purposes only. Performed at [facility], [address], [place], [place] [address]    Salicylate Lvl 04/07/2023 <7.0 (L)  7.0 - 30.0 mg/dL Final   Performed at [facility], [address] [place], [place] [address]   Acetaminophen (Tylenol), Serum 04/07/2023 <10 (L)  10 - 30 ug/mL Final   Comment: (NOTE) Therapeutic concentrations vary significantly. A range of 10-30 ug/mL  may be an effective concentration for many patients. However, some  are best treated at concentrations outside of this range. Acetaminophen concentrations >150 ug/mL at 4 hours after ingestion  and >50 ug/mL at 12 hours after ingestion are often associated with  toxic reactions.  Performed at [facility], [address], [place], [place] [address]    WBC 04/07/2023 8.7  4.0 - 10.5 K/uL Final   RBC 04/07/2023 5.01  4.22 - 5.81 MIL/uL Final   Hemoglobin 04/07/2023 16.5  13.0 - 17.0 g/dL Final    HCT 59/56/3875 50.2  39.0 - 52.0 % Final   MCV 04/07/2023 100.2 (H)  80.0 - 100.0 fL Final   MCH 04/07/2023 32.9  26.0 - 34.0 pg Final  MCHC 04/07/2023 32.9  30.0 - 36.0 g/dL Final   RDW 13/02/6577 12.3  11.5 - 15.5 % Final   Platelets 04/07/2023 229  150 - 400 K/uL Final   nRBC 04/07/2023 0.0  0.0 - 0.2 % Final   Performed at [facility], [address] [place], [place] [address]   Opiates 04/07/2023 NONE DETECTED  NONE DETECTED Final   Cocaine 04/07/2023 NONE DETECTED  NONE DETECTED Final   Benzodiazepines 04/07/2023 NONE DETECTED  NONE DETECTED Final   Amphetamines 04/07/2023 NONE DETECTED  NONE DETECTED Final   Tetrahydrocannabinol 04/07/2023 POSITIVE (A)  NONE DETECTED Final   Barbiturates 04/07/2023 NONE DETECTED  NONE DETECTED Final   Comment: (NOTE) DRUG SCREEN FOR MEDICAL PURPOSES ONLY.  IF CONFIRMATION IS NEEDED FOR ANY PURPOSE, NOTIFY LAB WITHIN 5 DAYS.  LOWEST DETECTABLE LIMITS FOR URINE DRUG SCREEN Drug Class                     Cutoff (ng/mL) Amphetamine and metabolites    1000 Barbiturate and metabolites    200 Benzodiazepine                 200 Opiates and metabolites        300 Cocaine and metabolites        300 THC                            50 Performed at [facility], [address], [place], [place] [address]   Admission on 04/01/2023, Discharged on 04/02/2023  Component Date Value Ref Range Status   WBC 04/01/2023 7.2  4.0 - 10.5 K/uL Final   RBC 04/01/2023 4.80  4.22 - 5.81 MIL/uL Final   Hemoglobin 04/01/2023 15.2  13.0 - 17.0 g/dL Final   HCT 13/24/4010 47.4  39.0 - 52.0 % Final   MCV 04/01/2023 98.8  80.0 - 100.0 fL Final   MCH 04/01/2023 31.7  26.0 - 34.0 pg Final   MCHC 04/01/2023 32.1  30.0 - 36.0 g/dL Final   RDW 27/25/3664 11.9  11.5 - 15.5 % Final   Platelets 04/01/2023 220  150 - 400 K/uL Final   nRBC 04/01/2023 0.0  0.0 - 0.2 % Final   Neutrophils Relative % 04/01/2023 42  % Final   Neutro Abs  04/01/2023 3.0  1.7 - 7.7 K/uL Final   Lymphocytes Relative 04/01/2023 46  % Final   Lymphs Abs 04/01/2023 3.3  0.7 - 4.0 K/uL Final   Monocytes Relative 04/01/2023 9  % Final   Monocytes Absolute 04/01/2023 0.6  0.1 - 1.0 K/uL Final   Eosinophils Relative 04/01/2023 2  % Final   Eosinophils Absolute 04/01/2023 0.1  0.0 - 0.5 K/uL Final   Basophils Relative 04/01/2023 1  % Final   Basophils Absolute 04/01/2023 0.1  0.0 - 0.1 K/uL Final   Immature Granulocytes 04/01/2023 0  % Final   Abs Immature Granulocytes 04/01/2023 0.02  0.00 - 0.07 K/uL Final   Performed at [facility], [address] [place], [place] [address]   Sodium 04/01/2023 141  135 - 145 mmol/L Final   Potassium 04/01/2023 4.1  3.5 - 5.1 mmol/L Final   Chloride 04/01/2023 106  98 - 111 mmol/L Final   CO2 04/01/2023 25  22 - 32 mmol/L Final   Glucose, Bld 04/01/2023 100 (H)  70 - 99 mg/dL Final   Glucose reference range applies  only to samples taken after fasting for at least 8 hours.   BUN 04/01/2023 9  6 - 20 mg/dL Final   Creatinine, Ser 04/01/2023 1.10  0.61 - 1.24 mg/dL Final   Calcium 47/42/5956 9.5  8.9 - 10.3 mg/dL Final   Total Protein 38/75/6433 6.5  6.5 - 8.1 g/dL Final   Albumin 29/51/8841 3.9  3.5 - 5.0 g/dL Final   AST 66/12/3014 34  15 - 41 U/L Final   ALT 04/01/2023 37  0 - 44 U/L Final   Alkaline Phosphatase 04/01/2023 73  38 - 126 U/L Final   Total Bilirubin 04/01/2023 1.0  0.3 - 1.2 mg/dL Final   GFR, Estimated 04/01/2023 >60  >60 mL/min Final   Comment: (NOTE) Calculated using the CKD-EPI Creatinine Equation (2021)    Anion gap 04/01/2023 10  5 - 15 Final   Performed at [facility], [address], [place], [place] [address]  Admission on 03/29/2023, Discharged on 03/30/2023  Component Date Value Ref Range Status   Sodium 03/29/2023 139  135 - 145 mmol/L Final   Potassium 03/29/2023 3.5  3.5 - 5.1 mmol/L Final   Chloride 03/29/2023 99  98 - 111 mmol/L Final   CO2 03/29/2023 28  22 -  32 mmol/L Final   Glucose, Bld 03/29/2023 97  70 - 99 mg/dL Final   Glucose reference range applies only to samples taken after fasting for at least 8 hours.   BUN 03/29/2023 8  6 - 20 mg/dL Final   Creatinine, Ser 03/29/2023 1.07  0.61 - 1.24 mg/dL Final   Calcium 23/55/7322 9.5  8.9 - 10.3 mg/dL Final   Total Protein 02/54/2706 7.1  6.5 - 8.1 g/dL Final   Albumin 23/76/2831 4.1  3.5 - 5.0 g/dL Final   AST 51/76/1607 37  15 - 41 U/L Final   ALT 03/29/2023 41  0 - 44 U/L Final   Alkaline Phosphatase 03/29/2023 66  38 - 126 U/L Final   Total Bilirubin 03/29/2023 2.0 (H)  0.3 - 1.2 mg/dL Final   GFR, Estimated 03/29/2023 >60  >60 mL/min Final   Comment: (NOTE) Calculated using the CKD-EPI Creatinine Equation (2021)    Anion gap 03/29/2023 12  5 - 15 Final   Performed at [facility], [address], [place], [place] [address]   Alcohol, Ethyl (B) 03/29/2023 <10  <10 mg/dL Final   Comment: (NOTE) Lowest detectable limit for serum alcohol is 10 mg/dL.  For medical purposes only. Performed at [facility], [address] [place], [place] [address]    Salicylate Lvl 03/29/2023 <7.0 (L)  7.0 - 30.0 mg/dL Final   Performed at [facility], [address], [place], [place] [address]   Acetaminophen (Tylenol), Serum 03/29/2023 <10 (L)  10 - 30 ug/mL Final   Comment: (NOTE) Therapeutic concentrations vary significantly. A range of 10-30 ug/mL  may be an effective concentration for many patients. However, some  are best treated at concentrations outside of this range. Acetaminophen concentrations >150 ug/mL at 4 hours after ingestion  and >50 ug/mL at 12 hours after ingestion are often associated with  toxic reactions.  Performed at [facility], [address] [place], [place] [address]    WBC 03/29/2023 5.6  4.0 - 10.5 K/uL Final   RBC 03/29/2023 4.92  4.22 - 5.81 MIL/uL Final   Hemoglobin 03/29/2023 15.5  13.0 - 17.0 g/dL Final   HCT 93/81/8299 46.6  39.0 -  52.0 % Final   MCV 03/29/2023 94.7  80.0 - 100.0 fL Final   MCH 03/29/2023 31.5  26.0 - 34.0 pg Final   MCHC 03/29/2023 33.3  30.0 - 36.0 g/dL Final   RDW 40/98/1191 12.0  11.5 - 15.5 % Final   Platelets 03/29/2023 231  150 - 400 K/uL Final   nRBC 03/29/2023 0.0  0.0 - 0.2 % Final   Performed at [facility], [address] [place], [place] [address]   Opiates 03/29/2023 NONE DETECTED  NONE DETECTED Final   Cocaine 03/29/2023 NONE DETECTED  NONE DETECTED Final   Benzodiazepines 03/29/2023 NONE DETECTED  NONE DETECTED Final   Amphetamines 03/29/2023 NONE DETECTED  NONE DETECTED Final   Tetrahydrocannabinol 03/29/2023 POSITIVE (A)  NONE DETECTED Final   Barbiturates 03/29/2023 NONE DETECTED  NONE DETECTED Final   Comment: (NOTE) DRUG SCREEN FOR MEDICAL PURPOSES ONLY.  IF CONFIRMATION IS NEEDED FOR ANY PURPOSE, NOTIFY LAB WITHIN 5 DAYS.  LOWEST DETECTABLE LIMITS FOR URINE DRUG SCREEN Drug Class                     Cutoff (ng/mL) Amphetamine and metabolites    1000 Barbiturate and metabolites    200 Benzodiazepine                 200 Opiates and metabolites        300 Cocaine and metabolites        300 THC                            50 Performed at [facility], [address], [place], [place] [address]    Glucose-Capillary 03/29/2023 150 (H)  70 - 99 mg/dL Final   Glucose reference range applies only to samples taken after fasting for at least 8 hours.  Admission on 03/26/2023, Discharged on 03/26/2023  Component Date Value Ref Range Status   Glucose-Capillary 03/26/2023 86  70 - 99 mg/dL Final   Glucose reference range applies only to samples taken after fasting for at least 8 hours.  Admission on 03/26/2023, Discharged on 03/26/2023  Component Date Value Ref Range Status   Sodium 03/26/2023 135  135 - 145 mmol/L Final   Potassium 03/26/2023 3.0 (L)  3.5 - 5.1 mmol/L Final   Chloride 03/26/2023 100  98 - 111 mmol/L Final   CO2 03/26/2023 19 (L)  22 - 32 mmol/L  Final   Glucose, Bld 03/26/2023 90  70 - 99 mg/dL Final   Glucose reference range applies only to samples taken after fasting for at least 8 hours.   BUN 03/26/2023 18  6 - 20 mg/dL Final   Creatinine, Ser 03/26/2023 1.26 (H)  0.61 - 1.24 mg/dL Final   Calcium 08/65/7846 9.0  8.9 - 10.3 mg/dL Final   GFR, Estimated 03/26/2023 >60  >60 mL/min Final   Comment: (NOTE) Calculated using the CKD-EPI Creatinine Equation (2021)    Anion gap 03/26/2023 16 (H)  5 - 15 Final   Performed at [facility], [address] [place], [place] [address]   WBC 03/26/2023 7.4  4.0 - 10.5 K/uL Final   RBC 03/26/2023 5.06  4.22 - 5.81 MIL/uL Final   Hemoglobin 03/26/2023 16.0  13.0 - 17.0 g/dL Final   HCT 28/41/3244 46.8  39.0 - 52.0 % Final   MCV 03/26/2023 92.5  80.0 - 100.0 fL Final  MCH 03/26/2023 31.6  26.0 - 34.0 pg Final   MCHC 03/26/2023 34.2  30.0 - 36.0 g/dL Final   RDW 78/29/5621 12.2  11.5 - 15.5 % Final   Platelets 03/26/2023 233  150 - 400 K/uL Final   nRBC 03/26/2023 0.0  0.0 - 0.2 % Final   Neutrophils Relative % 03/26/2023 49  % Final   Neutro Abs 03/26/2023 3.6  1.7 - 7.7 K/uL Final   Lymphocytes Relative 03/26/2023 37  % Final   Lymphs Abs 03/26/2023 2.8  0.7 - 4.0 K/uL Final   Monocytes Relative 03/26/2023 11  % Final   Monocytes Absolute 03/26/2023 0.8  0.1 - 1.0 K/uL Final   Eosinophils Relative 03/26/2023 2  % Final   Eosinophils Absolute 03/26/2023 0.1  0.0 - 0.5 K/uL Final   Basophils Relative 03/26/2023 1  % Final   Basophils Absolute 03/26/2023 0.0  0.0 - 0.1 K/uL Final   Immature Granulocytes 03/26/2023 0  % Final   Abs Immature Granulocytes 03/26/2023 0.02  0.00 - 0.07 K/uL Final   Performed at [facility], [address], [place], [place] [address]   Magnesium 03/26/2023 1.8  1.7 - 2.4 mg/dL Final   Performed at [facility], [address], [place], [place] [address]  There may be more visits with results that are not included.    Allergies: Patient  has no known allergies.  Medications:  Facility Ordered Medications  Medication   acetaminophen (TYLENOL) tablet 650 mg   alum & mag hydroxide-simeth (MAALOX/MYLANTA) 200-200-20 MG/5ML suspension 15 mL   magnesium hydroxide (MILK OF MAGNESIA) suspension 15 mL   bictegravir-emtricitabine-tenofovir AF (BIKTARVY) 50-200-25 MG per tablet 1 tablet   levETIRAcetam (KEPPRA) tablet 1,500 mg   OLANZapine (ZYPREXA) tablet 7.5 mg   traZODone (DESYREL) tablet 50 mg   escitalopram (LEXAPRO) tablet 20 mg   nicotine (NICODERM CQ - dosed in mg/24 hours) patch 14 mg   dicyclomine (BENTYL) tablet 20 mg   hydrOXYzine (ATARAX) tablet 25 mg   loperamide (IMODIUM) capsule 2-4 mg   methocarbamol (ROBAXIN) tablet 500 mg   naproxen (NAPROSYN) tablet 500 mg   ondansetron (ZOFRAN-ODT) disintegrating tablet 4 mg   PTA Medications  Medication Sig   bictegravir-emtricitabine-tenofovir AF (BIKTARVY) 50-200-25 MG TABS tablet Take 1 tablet by mouth daily.   escitalopram (LEXAPRO) 20 MG tablet Take 1 tablet (20 mg total) by mouth daily.   OLANZapine (ZYPREXA) 7.5 MG tablet Take 1 tablet (7.5 mg total) by mouth at bedtime.   traZODone (DESYREL) 50 MG tablet Take 1 tablet (50 mg total) by mouth at bedtime as needed for sleep.   levETIRAcetam (KEPPRA) 750 MG tablet TAKE 2 TABLETS(1500 MG) BY MOUTH TWICE DAILY      Medical Decision Making  Recommend inpatient psychiatric admission for stabilization and treatment. Patient continues to endorse SI with plan/intent, and passive HI. He is unable to contract for safety.  Patient has a history of 22 ED/UC visits with similar psychiatric stories centering around hallucination, depression, substance abuse and SI with plan.. Patient is homeless and there is concern to reasonably believe the patient is malingering and using the ED/hospital for secondary gain of shelter.   Patient is interested in long-term residential substance abuse treatment program.  Lab Orders         CBC  with Differential/Platelet         Comprehensive metabolic panel         Ethanol  POCT Urine Drug Screen - (I-Screen)       Recommend COWS protocol -Prn clonidine taper -hydroxyzine 25 mg PO every 6 hours prn for anxiety -ondansetron 4 mg ODT every 6 hours prn nausea/vomiting -naproxen 500 mg BID prn for pain -dicyclomine 20 mg PO every 6 hours prn abdominal cramping -loperamide 2-4 mg capsule prn diarrhea -methocarbamol 500 mg PO every 8 hours prn spasms   Home medication reordered -Biktarvy 1 tablet PO daily for HIV disease -Lexapro 20 mg p.o. daily for depressive symptoms. -Keppra 150 0 mg p.o. 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  Recommend inpatient psychiatric admission for stabilization and treatment.  Recommend COWS Protocol   Mancel Bale, NP 06/06/23  4:42 AM

## 2023-06-06 NOTE — ED Notes (Signed)
Pt sleeping at present, no distress noted at present.  Monitoring for safety.

## 2023-06-07 ENCOUNTER — Inpatient Hospital Stay: Admitting: Neurology

## 2023-06-07 ENCOUNTER — Encounter: Payer: Self-pay | Admitting: Neurology

## 2023-06-07 MED ORDER — TRAZODONE HCL 100 MG PO TABS
100.0000 mg | ORAL_TABLET | Freq: Every evening | ORAL | Status: DC | PRN
  Administered 2023-06-07: 100 mg via ORAL
  Filled 2023-06-07: qty 1

## 2023-06-07 NOTE — ED Notes (Signed)
 Patient A&Ox4. Denies intent to harm self/others when asked. Denies A/VH. Patient denies any physical complaints. No acute distress observed. Routine safety checks conducted according to facility protocol. Patient agreed to notify staff if thoughts of harm toward self or others arise. Will continue to monitor for safety.

## 2023-06-07 NOTE — ED Provider Notes (Signed)
Behavioral Health Progress Note  Date and Time: 06/07/2023 4:46 PM Name: Justin [name] MRN:  [id number]  Subjective:    Patient evaluated on the unit. Reports sleep is poor. Reports appetite is good. States mood is "fine" today. He continues to report interest in long term facility without admission to [facility]. Discussed that tomorrow he will likely be discharged tomorrow morning.   On interview, suicidal ideations are not present . Homicidal ideations are not present.   There are no auditory hallucinations, visual hallucinations, paranoid ideations, or delusional thought processes.   Side effects to currently prescribed medications are none. There are no somatic complaints. Reports regular bowel movements.   Diagnosis:  Final diagnoses:  Substance induced mood disorder (HCC)  Polysubstance abuse (HCC)  Schizoaffective disorder, unspecified type (HCC)    Total Time spent with patient: 20 minutes   Additional Social History:    Pain Medications: See MAR Prescriptions: See MAR Over the Counter: See MAR History of alcohol / drug use?: Yes Longest period of sobriety (when/how long): Unknown Negative Consequences of Use: Personal relationships, Work / Programmer, multimedia, Surveyor, quantity, Armed forces operational officer Withdrawal Symptoms: None     Current Medications:  Current Facility-Administered Medications  Medication Dose Route Frequency Provider Last Rate Last Admin   acetaminophen (TYLENOL) tablet 650 mg  650 mg Oral Q6H PRN Onuoha, Chinwendu V, NP       alum & mag hydroxide-simeth (MAALOX/MYLANTA) 200-200-20 MG/5ML suspension 15 mL  15 mL Oral Q6H PRN Onuoha, Chinwendu V, NP       bictegravir-emtricitabine-tenofovir AF (BIKTARVY) 50-200-25 MG per tablet 1 tablet  1 tablet Oral Daily Onuoha, Chinwendu V, NP   1 tablet at 06/07/23 1044   dicyclomine (BENTYL) tablet 20 mg  20 mg Oral Q6H PRN Onuoha, Chinwendu V, NP       escitalopram (LEXAPRO) tablet 20 mg  20 mg Oral Daily Onuoha, Chinwendu V, NP   20 mg at  06/07/23 1044   hydrOXYzine (ATARAX) tablet 25 mg  25 mg Oral Q6H PRN Onuoha, Chinwendu V, NP       levETIRAcetam (KEPPRA) tablet 1,500 mg  1,500 mg Oral BID Onuoha, Chinwendu V, NP   1,500 mg at 06/07/23 1043   loperamide (IMODIUM) capsule 2-4 mg  2-4 mg Oral PRN Onuoha, Chinwendu V, NP       magnesium hydroxide (MILK OF MAGNESIA) suspension 15 mL  15 mL Oral Daily PRN Onuoha, Chinwendu V, NP       methocarbamol (ROBAXIN) tablet 500 mg  500 mg Oral Q8H PRN Onuoha, Chinwendu V, NP   500 mg at 06/06/23 1943   naproxen (NAPROSYN) tablet 500 mg  500 mg Oral BID PRN Onuoha, Chinwendu V, NP   500 mg at 06/06/23 2114   nicotine (NICODERM CQ - dosed in mg/24 hours) patch 14 mg  14 mg Transdermal Daily Maryagnes Amos, FNP   14 mg at 06/07/23 1048   OLANZapine (ZYPREXA) tablet 7.5 mg  7.5 mg Oral QHS Onuoha, Chinwendu V, NP   7.5 mg at 06/06/23 2114   ondansetron (ZOFRAN-ODT) disintegrating tablet 4 mg  4 mg Oral Q6H PRN Onuoha, Chinwendu V, NP       traZODone (DESYREL) tablet 100 mg  100 mg Oral QHS PRN Carrion-Carrero, Jailah Willis, MD       Current Outpatient Medications  Medication Sig Dispense Refill   gabapentin (NEURONTIN) 300 MG capsule Take 300 mg by mouth 3 (three) times daily as needed (For anxiety).     hydrOXYzine (VISTARIL) 50 MG  capsule Take 50 mg by mouth every 6 (six) hours as needed for anxiety.     levETIRAcetam (KEPPRA) 750 MG tablet TAKE 2 TABLETS(1500 MG) BY MOUTH TWICE DAILY (Patient taking differently: Take 1,500 mg by mouth 2 (two) times daily.) 120 tablet 2   OLANZapine (ZYPREXA) 7.5 MG tablet Take 1 tablet (7.5 mg total) by mouth at bedtime. 30 tablet 0   traZODone (DESYREL) 50 MG tablet Take 1 tablet (50 mg total) by mouth at bedtime as needed for sleep. 30 tablet 0   escitalopram (LEXAPRO) 20 MG tablet Take 1 tablet (20 mg total) by mouth daily. (Patient not taking: Reported on 06/06/2023) 30 tablet 0    Labs  Lab Results:  Admission on 06/05/2023  Component Date Value  Ref Range Status   WBC 06/06/2023 7.7  4.0 - 10.5 K/uL Final   RBC 06/06/2023 5.23  4.22 - 5.81 MIL/uL Final   Hemoglobin 06/06/2023 16.9  13.0 - 17.0 g/dL Final   HCT 33/29/5188 50.5  39.0 - 52.0 % Final   MCV 06/06/2023 96.6  80.0 - 100.0 fL Final   MCH 06/06/2023 32.3  26.0 - 34.0 pg Final   MCHC 06/06/2023 33.5  30.0 - 36.0 g/dL Final   RDW 41/66/0630 11.3 (L)  11.5 - 15.5 % Final   Platelets 06/06/2023 305  150 - 400 K/uL Final   nRBC 06/06/2023 0.0  0.0 - 0.2 % Final   Neutrophils Relative % 06/06/2023 60  % Final   Neutro Abs 06/06/2023 4.6  1.7 - 7.7 K/uL Final   Lymphocytes Relative 06/06/2023 31  % Final   Lymphs Abs 06/06/2023 2.4  0.7 - 4.0 K/uL Final   Monocytes Relative 06/06/2023 8  % Final   Monocytes Absolute 06/06/2023 0.6  0.1 - 1.0 K/uL Final   Eosinophils Relative 06/06/2023 1  % Final   Eosinophils Absolute 06/06/2023 0.1  0.0 - 0.5 K/uL Final   Basophils Relative 06/06/2023 0  % Final   Basophils Absolute 06/06/2023 0.0  0.0 - 0.1 K/uL Final   Immature Granulocytes 06/06/2023 0  % Final   Abs Immature Granulocytes 06/06/2023 0.01  0.00 - 0.07 K/uL Final   Performed at [facility], [address] [place], [place] [address]   Sodium 06/06/2023 143  135 - 145 mmol/L Final   Potassium 06/06/2023 4.7  3.5 - 5.1 mmol/L Final   Chloride 06/06/2023 106  98 - 111 mmol/L Final   CO2 06/06/2023 28  22 - 32 mmol/L Final   Glucose, Bld 06/06/2023 94  70 - 99 mg/dL Final   Glucose reference range applies only to samples taken after fasting for at least 8 hours.   BUN 06/06/2023 9  6 - 20 mg/dL Final   Creatinine, Ser 06/06/2023 1.14  0.61 - 1.24 mg/dL Final   Calcium 93/23/5573 9.9  8.9 - 10.3 mg/dL Final   Total Protein 22/08/5425 7.5  6.5 - 8.1 g/dL Final   Albumin 12/25/7626 4.0  3.5 - 5.0 g/dL Final   AST 31/51/7616 29  15 - 41 U/L Final   ALT 06/06/2023 40  0 - 44 U/L Final   Alkaline Phosphatase 06/06/2023 86  38 - 126 U/L Final   Total Bilirubin  06/06/2023 0.8  <1.2 mg/dL Final   GFR, Estimated 06/06/2023 >60  >60 mL/min Final   Comment: (NOTE) Calculated using the CKD-EPI Creatinine Equation (2021)    Anion gap 06/06/2023 9  5 - 15 Final   Performed at  [facility], [address] [place], [place] [address]   POC Amphetamine UR 06/06/2023 None Detected  NONE DETECTED (Cut Off Level 1000 ng/mL) Final   POC Secobarbital (BAR) 06/06/2023 None Detected  NONE DETECTED (Cut Off Level 300 ng/mL) Final   POC Buprenorphine (BUP) 06/06/2023 None Detected  NONE DETECTED (Cut Off Level 10 ng/mL) Final   POC Oxazepam (BZO) 06/06/2023 None Detected  NONE DETECTED (Cut Off Level 300 ng/mL) Final   POC Cocaine UR 06/06/2023 None Detected  NONE DETECTED (Cut Off Level 300 ng/mL) Final   POC Methamphetamine UR 06/06/2023 None Detected  NONE DETECTED (Cut Off Level 1000 ng/mL) Final   POC Morphine 06/06/2023 None Detected  NONE DETECTED (Cut Off Level 300 ng/mL) Final   POC Methadone UR 06/06/2023 None Detected  NONE DETECTED (Cut Off Level 300 ng/mL) Final   POC Oxycodone UR 06/06/2023 None Detected  NONE DETECTED (Cut Off Level 100 ng/mL) Final   POC Marijuana UR 06/06/2023 None Detected  NONE DETECTED (Cut Off Level 50 ng/mL) Final  Admission on 05/03/2023, Discharged on 05/04/2023  Component Date Value Ref Range Status   Alcohol, Ethyl (B) 05/04/2023 <10  <10 mg/dL Final   Comment: (NOTE) Lowest detectable limit for serum alcohol is 10 mg/dL.  For medical purposes only. Performed at [facility], [address], [place], [place] [address]   Admission on 05/02/2023, Discharged on 05/03/2023  Component Date Value Ref Range Status   WBC 05/02/2023 8.1  4.0 - 10.5 K/uL Final   RBC 05/02/2023 4.82  4.22 - 5.81 MIL/uL Final   Hemoglobin 05/02/2023 15.7  13.0 - 17.0 g/dL Final   HCT 09/81/1914 46.8  39.0 - 52.0 % Final   MCV 05/02/2023 97.1  80.0 - 100.0 fL Final   MCH 05/02/2023 32.6  26.0 - 34.0 pg Final   MCHC 05/02/2023 33.5   30.0 - 36.0 g/dL Final   RDW 78/29/5621 11.9  11.5 - 15.5 % Final   Platelets 05/02/2023 206  150 - 400 K/uL Final   nRBC 05/02/2023 0.0  0.0 - 0.2 % Final   Neutrophils Relative % 05/02/2023 71  % Final   Neutro Abs 05/02/2023 5.7  1.7 - 7.7 K/uL Final   Lymphocytes Relative 05/02/2023 23  % Final   Lymphs Abs 05/02/2023 1.8  0.7 - 4.0 K/uL Final   Monocytes Relative 05/02/2023 6  % Final   Monocytes Absolute 05/02/2023 0.5  0.1 - 1.0 K/uL Final   Eosinophils Relative 05/02/2023 0  % Final   Eosinophils Absolute 05/02/2023 0.0  0.0 - 0.5 K/uL Final   Basophils Relative 05/02/2023 0  % Final   Basophils Absolute 05/02/2023 0.0  0.0 - 0.1 K/uL Final   Immature Granulocytes 05/02/2023 0  % Final   Abs Immature Granulocytes 05/02/2023 0.03  0.00 - 0.07 K/uL Final   Performed at [facility], [address] [place], [place] [address]   Sodium 05/02/2023 139  135 - 145 mmol/L Final   Potassium 05/02/2023 3.6  3.5 - 5.1 mmol/L Final   Chloride 05/02/2023 103  98 - 111 mmol/L Final   CO2 05/02/2023 25  22 - 32 mmol/L Final   Glucose, Bld 05/02/2023 112 (H)  70 - 99 mg/dL Final   Glucose reference range applies only to samples taken after fasting for at least 8 hours.   BUN 05/02/2023 14  6 - 20 mg/dL Final   Creatinine, Ser 05/02/2023 0.95  0.61 - 1.24  mg/dL Final   Calcium 09/81/1914 9.1  8.9 - 10.3 mg/dL Final   Total Protein 78/29/5621 7.4  6.5 - 8.1 g/dL Final   Albumin 30/86/5784 4.2  3.5 - 5.0 g/dL Final   AST 69/62/9528 25  15 - 41 U/L Final   ALT 05/02/2023 27  0 - 44 U/L Final   Alkaline Phosphatase 05/02/2023 68  38 - 126 U/L Final   Total Bilirubin 05/02/2023 1.9 (H)  0.3 - 1.2 mg/dL Final   GFR, Estimated 05/02/2023 >60  >60 mL/min Final   Comment: (NOTE) Calculated using the CKD-EPI Creatinine Equation (2021)    Anion gap 05/02/2023 11  5 - 15 Final   Performed at [facility], [address], [place], [place] [address]   Lipase  05/02/2023 33  11 - 51 U/L Final   Performed at [facility], [address], [place], [place] [address]   Levetiracetam Lvl 05/02/2023 12.2  10.0 - 40.0 ug/mL Final   Comment: (NOTE) Performed At: [facility] [address] [place], [place] [id number] Jolene Schimke MD [id number]   Admission on 04/30/2023, Discharged on 05/02/2023  Component Date Value Ref Range Status   Sodium 04/30/2023 137  135 - 145 mmol/L Final   Potassium 04/30/2023 3.5  3.5 - 5.1 mmol/L Final   Chloride 04/30/2023 101  98 - 111 mmol/L Final   CO2 04/30/2023 20 (L)  22 - 32 mmol/L Final   Glucose, Bld 04/30/2023 88  70 - 99 mg/dL Final   Glucose reference range applies only to samples taken after fasting for at least 8 hours.   BUN 04/30/2023 26 (H)  6 - 20 mg/dL Final   Creatinine, Ser 04/30/2023 1.21  0.61 - 1.24 mg/dL Final   Calcium 56/43/3295 9.3  8.9 - 10.3 mg/dL Final   Total Protein 18/84/1660 7.8  6.5 - 8.1 g/dL Final   Albumin 63/07/6008 4.6  3.5 - 5.0 g/dL Final   AST 93/23/5573 53 (H)  15 - 41 U/L Final   ALT 04/30/2023 37  0 - 44 U/L Final   Alkaline Phosphatase 04/30/2023 81  38 - 126 U/L Final   Total Bilirubin 04/30/2023 2.8 (H)  0.3 - 1.2 mg/dL Final   GFR, Estimated 04/30/2023 >60  >60 mL/min Final   Comment: (NOTE) Calculated using the CKD-EPI Creatinine Equation (2021)    Anion gap 04/30/2023 16 (H)  5 - 15 Final   Performed at [facility], [address], [place], [place] [address]   WBC 04/30/2023 10.2  4.0 - 10.5 K/uL Final   RBC 04/30/2023 4.92  4.22 - 5.81 MIL/uL Final   Hemoglobin 04/30/2023 15.9  13.0 - 17.0 g/dL Final   HCT 42/70/6237 47.6  39.0 - 52.0 % Final   MCV 04/30/2023 96.7  80.0 - 100.0 fL Final   MCH 04/30/2023 32.3  26.0 - 34.0 pg Final   MCHC 04/30/2023 33.4  30.0 - 36.0 g/dL Final   RDW 62/83/1517 12.1  11.5 - 15.5 % Final   Platelets 04/30/2023 218  150 - 400 K/uL Final   nRBC 04/30/2023 0.0  0.0 - 0.2 % Final    Neutrophils Relative % 04/30/2023 64  % Final   Neutro Abs 04/30/2023 6.5  1.7 - 7.7 K/uL Final   Lymphocytes Relative 04/30/2023 25  % Final   Lymphs Abs 04/30/2023 2.5  0.7 - 4.0 K/uL Final   Monocytes Relative 04/30/2023 11  % Final   Monocytes Absolute  04/30/2023 1.1 (H)  0.1 - 1.0 K/uL Final   Eosinophils Relative 04/30/2023 0  % Final   Eosinophils Absolute 04/30/2023 0.0  0.0 - 0.5 K/uL Final   Basophils Relative 04/30/2023 0  % Final   Basophils Absolute 04/30/2023 0.0  0.0 - 0.1 K/uL Final   Immature Granulocytes 04/30/2023 0  % Final   Abs Immature Granulocytes 04/30/2023 0.03  0.00 - 0.07 K/uL Final   Performed at [facility], [address], [place], [place] [address]   Opiates 04/30/2023 NONE DETECTED  NONE DETECTED Final   Cocaine 04/30/2023 NONE DETECTED  NONE DETECTED Final   Benzodiazepines 04/30/2023 NONE DETECTED  NONE DETECTED Final   Amphetamines 04/30/2023 POSITIVE (A)  NONE DETECTED Final   Tetrahydrocannabinol 04/30/2023 NONE DETECTED  NONE DETECTED Final   Barbiturates 04/30/2023 NONE DETECTED  NONE DETECTED Final   Comment: (NOTE) DRUG SCREEN FOR MEDICAL PURPOSES ONLY.  IF CONFIRMATION IS NEEDED FOR ANY PURPOSE, NOTIFY LAB WITHIN 5 DAYS.  LOWEST DETECTABLE LIMITS FOR URINE DRUG SCREEN Drug Class                     Cutoff (ng/mL) Amphetamine and metabolites    1000 Barbiturate and metabolites    200 Benzodiazepine                 200 Opiates and metabolites        300 Cocaine and metabolites        300 THC                            50 Performed at [facility], [address], [place], [place] [address]    Color, Urine 04/30/2023 YELLOW  YELLOW Final   APPearance 04/30/2023 CLEAR  CLEAR Final   Specific Gravity, Urine 04/30/2023 1.027  1.005 - 1.030 Final   pH 04/30/2023 5.0  5.0 - 8.0 Final   Glucose, UA 04/30/2023 NEGATIVE  NEGATIVE mg/dL Final   Hgb urine dipstick 04/30/2023 NEGATIVE  NEGATIVE Final    Bilirubin Urine 04/30/2023 NEGATIVE  NEGATIVE Final   Ketones, ur 04/30/2023 20 (A)  NEGATIVE mg/dL Final   Protein, ur 09/81/1914 NEGATIVE  NEGATIVE mg/dL Final   Nitrite 78/29/5621 NEGATIVE  NEGATIVE Final   Leukocytes,Ua 04/30/2023 NEGATIVE  NEGATIVE Final   RBC / HPF 04/30/2023 0-5  0 - 5 RBC/hpf Final   WBC, UA 04/30/2023 0-5  0 - 5 WBC/hpf Final   Bacteria, UA 04/30/2023 RARE (A)  NONE SEEN Final   Squamous Epithelial / HPF 04/30/2023 0-5  0 - 5 /HPF Final   Mucus 04/30/2023 PRESENT   Final   Performed at [facility], [address], [place], [place] [address]  Office Visit on 04/25/2023  Component Date Value Ref Range Status   HIV 1 RNA Quant 04/25/2023 Not Detected  Copies/mL Final   HIV-1 RNA Quant, Log 04/25/2023 Not Detected  Log cps/mL Final   Comment: . Reference Range:                           Not Detected     copies/mL                           Not Detected Log copies/mL . [name] The test was performed using Real-Time Polymerase Chain Reaction. . . Reportable Range:  20 copies/mL to 10,000,000 copies/mL (1.30 Log copies/mL to 7.00 Log copies/mL). .    CD4 T Cell Abs 04/25/2023 537  400 - 1,790 /uL Final   CD4 % Helper T Cell 04/25/2023 31 (L)  33 - 65 % Final   Performed at [facility], [address], [place], [place] [address]   RPR Ser Ql 04/25/2023 NON-REACTIVE  NON-REACTIVE Final   Comment: . No laboratory evidence of syphilis. If recent exposure is suspected, submit a new sample in 2-4 weeks. .   Admission on 04/07/2023, Discharged on 04/08/2023  Component Date Value Ref Range Status   Glucose-Capillary 04/07/2023 126 (H)  70 - 99 mg/dL Final   Glucose reference range applies only to samples taken after fasting for at least 8 hours.   Sodium 04/07/2023 142  135 - 145 mmol/L Final   Potassium 04/07/2023 3.9  3.5 - 5.1 mmol/L Final   Chloride 04/07/2023 104  98 - 111 mmol/L Final   CO2 04/07/2023 26  22 - 32 mmol/L  Final   Glucose, Bld 04/07/2023 92  70 - 99 mg/dL Final   Glucose reference range applies only to samples taken after fasting for at least 8 hours.   BUN 04/07/2023 10  6 - 20 mg/dL Final   Creatinine, Ser 04/07/2023 0.97  0.61 - 1.24 mg/dL Final   Calcium 60/45/4098 9.6  8.9 - 10.3 mg/dL Final   Total Protein 11/91/4782 8.0  6.5 - 8.1 g/dL Final   Albumin 95/62/1308 4.4  3.5 - 5.0 g/dL Final   AST 65/78/4696 25  15 - 41 U/L Final   ALT 04/07/2023 33  0 - 44 U/L Final   Alkaline Phosphatase 04/07/2023 85  38 - 126 U/L Final   Total Bilirubin 04/07/2023 1.9 (H)  0.3 - 1.2 mg/dL Final   GFR, Estimated 04/07/2023 >60  >60 mL/min Final   Comment: (NOTE) Calculated using the CKD-EPI Creatinine Equation (2021)    Anion gap 04/07/2023 12  5 - 15 Final   Performed at [facility], [address], [place], [place] [address]   Alcohol, Ethyl (B) 04/07/2023 <10  <10 mg/dL Final   Comment: (NOTE) Lowest detectable limit for serum alcohol is 10 mg/dL.  For medical purposes only. Performed at [facility], [address], [place], [place] [address]    Salicylate Lvl 04/07/2023 <7.0 (L)  7.0 - 30.0 mg/dL Final   Performed at [facility], [address], [place], [place] [address]   Acetaminophen (Tylenol), Serum 04/07/2023 <10 (L)  10 - 30 ug/mL Final   Comment: (NOTE) Therapeutic concentrations vary significantly. A range of 10-30 ug/mL  may be an effective concentration for many patients. However, some  are best treated at concentrations outside of this range. Acetaminophen concentrations >150 ug/mL at 4 hours after ingestion  and >50 ug/mL at 12 hours after ingestion are often associated with  toxic reactions.  Performed at [facility], [address], [place], [place] [address]    WBC 04/07/2023 8.7  4.0 - 10.5 K/uL Final   RBC 04/07/2023 5.01  4.22 - 5.81 MIL/uL Final   Hemoglobin 04/07/2023 16.5  13.0 -  17.0 g/dL Final   HCT 64/40/3474 50.2  39.0 - 52.0 % Final   MCV 04/07/2023 100.2 (H)  80.0 - 100.0 fL Final   MCH 04/07/2023 32.9  26.0 - 34.0 pg Final   MCHC 04/07/2023 32.9  30.0 - 36.0 g/dL Final  RDW 04/07/2023 12.3  11.5 - 15.5 % Final   Platelets 04/07/2023 229  150 - 400 K/uL Final   nRBC 04/07/2023 0.0  0.0 - 0.2 % Final   Performed at [facility], [address], [place], [place] [address]   Opiates 04/07/2023 NONE DETECTED  NONE DETECTED Final   Cocaine 04/07/2023 NONE DETECTED  NONE DETECTED Final   Benzodiazepines 04/07/2023 NONE DETECTED  NONE DETECTED Final   Amphetamines 04/07/2023 NONE DETECTED  NONE DETECTED Final   Tetrahydrocannabinol 04/07/2023 POSITIVE (A)  NONE DETECTED Final   Barbiturates 04/07/2023 NONE DETECTED  NONE DETECTED Final   Comment: (NOTE) DRUG SCREEN FOR MEDICAL PURPOSES ONLY.  IF CONFIRMATION IS NEEDED FOR ANY PURPOSE, NOTIFY LAB WITHIN 5 DAYS.  LOWEST DETECTABLE LIMITS FOR URINE DRUG SCREEN Drug Class                     Cutoff (ng/mL) Amphetamine and metabolites    1000 Barbiturate and metabolites    200 Benzodiazepine                 200 Opiates and metabolites        300 Cocaine and metabolites        300 THC                            50 Performed at [facility], [address], [place], [place] [address]   Admission on 04/01/2023, Discharged on 04/02/2023  Component Date Value Ref Range Status   WBC 04/01/2023 7.2  4.0 - 10.5 K/uL Final   RBC 04/01/2023 4.80  4.22 - 5.81 MIL/uL Final   Hemoglobin 04/01/2023 15.2  13.0 - 17.0 g/dL Final   HCT 09/81/1914 47.4  39.0 - 52.0 % Final   MCV 04/01/2023 98.8  80.0 - 100.0 fL Final   MCH 04/01/2023 31.7  26.0 - 34.0 pg Final   MCHC 04/01/2023 32.1  30.0 - 36.0 g/dL Final   RDW 78/29/5621 11.9  11.5 - 15.5 % Final   Platelets 04/01/2023 220  150 - 400 K/uL Final   nRBC 04/01/2023 0.0  0.0 - 0.2 % Final   Neutrophils Relative % 04/01/2023 42  %  Final   Neutro Abs 04/01/2023 3.0  1.7 - 7.7 K/uL Final   Lymphocytes Relative 04/01/2023 46  % Final   Lymphs Abs 04/01/2023 3.3  0.7 - 4.0 K/uL Final   Monocytes Relative 04/01/2023 9  % Final   Monocytes Absolute 04/01/2023 0.6  0.1 - 1.0 K/uL Final   Eosinophils Relative 04/01/2023 2  % Final   Eosinophils Absolute 04/01/2023 0.1  0.0 - 0.5 K/uL Final   Basophils Relative 04/01/2023 1  % Final   Basophils Absolute 04/01/2023 0.1  0.0 - 0.1 K/uL Final   Immature Granulocytes 04/01/2023 0  % Final   Abs Immature Granulocytes 04/01/2023 0.02  0.00 - 0.07 K/uL Final   Performed at [facility], [address], [place], [place] [address]   Sodium 04/01/2023 141  135 - 145 mmol/L Final   Potassium 04/01/2023 4.1  3.5 - 5.1 mmol/L Final   Chloride 04/01/2023 106  98 - 111 mmol/L Final   CO2 04/01/2023 25  22 - 32 mmol/L Final   Glucose, Bld 04/01/2023 100 (H)  70 - 99 mg/dL Final   Glucose reference range applies only to samples taken after fasting for at least 8 hours.  BUN 04/01/2023 9  6 - 20 mg/dL Final   Creatinine, Ser 04/01/2023 1.10  0.61 - 1.24 mg/dL Final   Calcium 66/12/3014 9.5  8.9 - 10.3 mg/dL Final   Total Protein 07/12/3233 6.5  6.5 - 8.1 g/dL Final   Albumin 57/32/2025 3.9  3.5 - 5.0 g/dL Final   AST 42/70/6237 34  15 - 41 U/L Final   ALT 04/01/2023 37  0 - 44 U/L Final   Alkaline Phosphatase 04/01/2023 73  38 - 126 U/L Final   Total Bilirubin 04/01/2023 1.0  0.3 - 1.2 mg/dL Final   GFR, Estimated 04/01/2023 >60  >60 mL/min Final   Comment: (NOTE) Calculated using the CKD-EPI Creatinine Equation (2021)    Anion gap 04/01/2023 10  5 - 15 Final   Performed at [facility], [address], [place], [place] [address]  Admission on 03/29/2023, Discharged on 03/30/2023  Component Date Value Ref Range Status   Sodium 03/29/2023 139  135 - 145 mmol/L Final   Potassium 03/29/2023 3.5  3.5 - 5.1 mmol/L Final   Chloride 03/29/2023 99  98 - 111 mmol/L Final   CO2  03/29/2023 28  22 - 32 mmol/L Final   Glucose, Bld 03/29/2023 97  70 - 99 mg/dL Final   Glucose reference range applies only to samples taken after fasting for at least 8 hours.   BUN 03/29/2023 8  6 - 20 mg/dL Final   Creatinine, Ser 03/29/2023 1.07  0.61 - 1.24 mg/dL Final   Calcium 51/76/1607 9.5  8.9 - 10.3 mg/dL Final   Total Protein 37/04/6268 7.1  6.5 - 8.1 g/dL Final   Albumin 48/54/6270 4.1  3.5 - 5.0 g/dL Final   AST 35/00/9381 37  15 - 41 U/L Final   ALT 03/29/2023 41  0 - 44 U/L Final   Alkaline Phosphatase 03/29/2023 66  38 - 126 U/L Final   Total Bilirubin 03/29/2023 2.0 (H)  0.3 - 1.2 mg/dL Final   GFR, Estimated 03/29/2023 >60  >60 mL/min Final   Comment: (NOTE) Calculated using the CKD-EPI Creatinine Equation (2021)    Anion gap 03/29/2023 12  5 - 15 Final   Performed at [facility], [address] [place], [place] [address]   Alcohol, Ethyl (B) 03/29/2023 <10  <10 mg/dL Final   Comment: (NOTE) Lowest detectable limit for serum alcohol is 10 mg/dL.  For medical purposes only. Performed at [facility], [address], [place], [place] [address]    Salicylate Lvl 03/29/2023 <7.0 (L)  7.0 - 30.0 mg/dL Final   Performed at [facility], [address] [place], [place] [address]   Acetaminophen (Tylenol), Serum 03/29/2023 <10 (L)  10 - 30 ug/mL Final   Comment: (NOTE) Therapeutic concentrations vary significantly. A range of 10-30 ug/mL  may be an effective concentration for many patients. However, some  are best treated at concentrations outside of this range. Acetaminophen concentrations >150 ug/mL at 4 hours after ingestion  and >50 ug/mL at 12 hours after ingestion are often associated with  toxic reactions.  Performed at [facility], [address] [place], [place] [address]    WBC 03/29/2023 5.6  4.0 - 10.5 K/uL Final   RBC 03/29/2023 4.92  4.22 - 5.81 MIL/uL Final   Hemoglobin 03/29/2023 15.5  13.0 - 17.0 g/dL Final   HCT  04/27/8526 46.6  39.0 - 52.0 % Final   MCV 03/29/2023 94.7  80.0 -  100.0 fL Final   MCH 03/29/2023 31.5  26.0 - 34.0 pg Final   MCHC 03/29/2023 33.3  30.0 - 36.0 g/dL Final   RDW 24/40/1027 12.0  11.5 - 15.5 % Final   Platelets 03/29/2023 231  150 - 400 K/uL Final   nRBC 03/29/2023 0.0  0.0 - 0.2 % Final   Performed at [facility], [address] [place], [place] [address]   Opiates 03/29/2023 NONE DETECTED  NONE DETECTED Final   Cocaine 03/29/2023 NONE DETECTED  NONE DETECTED Final   Benzodiazepines 03/29/2023 NONE DETECTED  NONE DETECTED Final   Amphetamines 03/29/2023 NONE DETECTED  NONE DETECTED Final   Tetrahydrocannabinol 03/29/2023 POSITIVE (A)  NONE DETECTED Final   Barbiturates 03/29/2023 NONE DETECTED  NONE DETECTED Final   Comment: (NOTE) DRUG SCREEN FOR MEDICAL PURPOSES ONLY.  IF CONFIRMATION IS NEEDED FOR ANY PURPOSE, NOTIFY LAB WITHIN 5 DAYS.  LOWEST DETECTABLE LIMITS FOR URINE DRUG SCREEN Drug Class                     Cutoff (ng/mL) Amphetamine and metabolites    1000 Barbiturate and metabolites    200 Benzodiazepine                 200 Opiates and metabolites        300 Cocaine and metabolites        300 THC                            50 Performed at [facility], [address] [place], [place] [address]    Glucose-Capillary 03/29/2023 150 (H)  70 - 99 mg/dL Final   Glucose reference range applies only to samples taken after fasting for at least 8 hours.  Admission on 03/26/2023, Discharged on 03/26/2023  Component Date Value Ref Range Status   Glucose-Capillary 03/26/2023 86  70 - 99 mg/dL Final   Glucose reference range applies only to samples taken after fasting for at least 8 hours.  Admission on 03/26/2023, Discharged on 03/26/2023  Component Date Value Ref Range Status   Sodium 03/26/2023 135  135 - 145 mmol/L Final   Potassium 03/26/2023 3.0 (L)  3.5 - 5.1 mmol/L Final   Chloride 03/26/2023 100  98 - 111 mmol/L Final   CO2 03/26/2023  19 (L)  22 - 32 mmol/L Final   Glucose, Bld 03/26/2023 90  70 - 99 mg/dL Final   Glucose reference range applies only to samples taken after fasting for at least 8 hours.   BUN 03/26/2023 18  6 - 20 mg/dL Final   Creatinine, Ser 03/26/2023 1.26 (H)  0.61 - 1.24 mg/dL Final   Calcium 74/25/9563 9.0  8.9 - 10.3 mg/dL Final   GFR, Estimated 03/26/2023 >60  >60 mL/min Final   Comment: (NOTE) Calculated using the CKD-EPI Creatinine Equation (2021)    Anion gap 03/26/2023 16 (H)  5 - 15 Final   Performed at [facility], [address], [place], [place] [address]   WBC 03/26/2023 7.4  4.0 - 10.5 K/uL Final   RBC 03/26/2023 5.06  4.22 - 5.81 MIL/uL Final   Hemoglobin 03/26/2023 16.0  13.0 - 17.0 g/dL Final   HCT 33/29/5188 46.8  39.0 - 52.0 % Final   MCV 03/26/2023 92.5  80.0 - 100.0 fL Final   MCH 03/26/2023 31.6  26.0 - 34.0 pg Final   MCHC 03/26/2023  34.2  30.0 - 36.0 g/dL Final   RDW 16/04/9603 12.2  11.5 - 15.5 % Final   Platelets 03/26/2023 233  150 - 400 K/uL Final   nRBC 03/26/2023 0.0  0.0 - 0.2 % Final   Neutrophils Relative % 03/26/2023 49  % Final   Neutro Abs 03/26/2023 3.6  1.7 - 7.7 K/uL Final   Lymphocytes Relative 03/26/2023 37  % Final   Lymphs Abs 03/26/2023 2.8  0.7 - 4.0 K/uL Final   Monocytes Relative 03/26/2023 11  % Final   Monocytes Absolute 03/26/2023 0.8  0.1 - 1.0 K/uL Final   Eosinophils Relative 03/26/2023 2  % Final   Eosinophils Absolute 03/26/2023 0.1  0.0 - 0.5 K/uL Final   Basophils Relative 03/26/2023 1  % Final   Basophils Absolute 03/26/2023 0.0  0.0 - 0.1 K/uL Final   Immature Granulocytes 03/26/2023 0  % Final   Abs Immature Granulocytes 03/26/2023 0.02  0.00 - 0.07 K/uL Final   Performed at [facility], [address] [place], [place] [address]   Magnesium 03/26/2023 1.8  1.7 - 2.4 mg/dL Final   Performed at [facility], [address], [place], [place] [address]  There may be more visits with results that are not included.     Blood Alcohol level:  Lab Results  Component Value Date   ETH <10 05/04/2023   ETH <10 04/07/2023    Metabolic Disorder Labs: Lab Results  Component Value Date   HGBA1C 4.7 (L) 04/18/2020   MPG 88 04/18/2020   No results found for: "PROLACTIN" Lab Results  Component Value Date   CHOL 173 03/03/2023   TRIG 103 03/03/2023   HDL 44 03/03/2023   CHOLHDL 3.9 03/03/2023   VLDL 8 04/18/2020   LDLCALC 109 (H) 03/03/2023   LDLCALC 97 08/03/2022    Therapeutic Lab Levels: No results found for: "LITHIUM" No results found for: "VALPROATE" No results found for: "CBMZ"  Physical Findings   PHQ2-9    Flowsheet Row ED from 05/04/2023 in [facility] Office Visit from 04/25/2023 in [facility] Dept Of [place]. [facility] Office Visit from 08/17/2022 in [facility] - A Dept Of [facility]. [facility] Office Visit from 06/30/2021 in [facility] - A Dept Of [place]. [facility] ED from 04/04/2021 in [facility] Emergency Department at [facility]  PHQ-2 Total Score 0 1 1 2 1   PHQ-9 Total Score 2 -- -- -- 3      Flowsheet Row ED from 06/05/2023 in [facility] ED from 05/04/2023 in [facility] ED from 05/03/2023 in [facility]  C-SSRS RISK CATEGORY High Risk High Risk High Risk        Musculoskeletal  Strength & Muscle Tone: within normal limits Gait & Station: normal Patient leans: N/A  Psychiatric Specialty Exam  Presentation  General Appearance:  Appropriate for Environment  Eye Contact: Good  Speech: Clear and Coherent; Normal Rate  Speech Volume: Normal  Handedness: -- (not assessed)   Mood and Affect  Mood: Euthymic  Affect: Congruent; Full Range   Thought Process  Thought Processes: Linear  Descriptions of  Associations:Intact  Orientation:-- (grossly intact)  Thought Content:Logical  Diagnosis of Schizophrenia or Schizoaffective disorder in past: No  Duration of Psychotic Symptoms: N/A   Hallucinations:Hallucinations:  None Description of Auditory Hallucinations: Pt reports hearing voices that tell him wha to believe Description of Visual Hallucinations: none  Ideas of Reference:None  Suicidal Thoughts:Suicidal Thoughts: No SI Active Intent and/or Plan: With Plan  Homicidal Thoughts:Homicidal Thoughts: No HI Passive Intent and/or Plan: With Intent   Sensorium  Memory: Immediate Good; Recent Good; Remote Good  Judgment: Fair  Insight: Fair   Chartered certified accountant: Fair  Attention Span: Fair  Recall: Jennelle Human of Knowledge: Fair  Language: Fair   Psychomotor Activity  Psychomotor Activity: Psychomotor Activity: Normal   Assets  Assets: Desire for Improvement; Resilience; Communication Skills   Sleep  Sleep: Sleep: Fair   Nutritional Assessment (For OBS and FBC admissions only) Has the patient had a weight loss or gain of 10 pounds or more in the last 3 months?: No Has the patient had a decrease in food intake/or appetite?: No Does the patient have dental problems?: No Does the patient have eating habits or behaviors that may be indicators of an eating disorder including binging or inducing vomiting?: No Has the patient recently lost weight without trying?: 0 Has the patient been eating poorly because of a decreased appetite?: 0 Malnutrition Screening Tool Score: 0    Physical Exam  Physical Exam Vitals and nursing note reviewed.  Constitutional:      General: He is not in acute distress.    Appearance: He is well-developed.  HENT:     Head: Normocephalic and atraumatic.  Eyes:     Conjunctiva/sclera: Conjunctivae normal.  Cardiovascular:     Rate and Rhythm: Normal rate and regular rhythm.     Heart sounds: No murmur  heard. Pulmonary:     Effort: Pulmonary effort is normal. No respiratory distress.     Breath sounds: Normal breath sounds.  Abdominal:     Palpations: Abdomen is soft.  Musculoskeletal:     Cervical back: Neck supple.  Skin:    General: Skin is warm and dry.  Neurological:     Mental Status: He is alert.    Review of Systems  All other systems reviewed and are negative.  Blood pressure 105/80, pulse 84, temperature 98.3 F (36.8 C), temperature source Oral, resp. rate 18, SpO2 100%. There is no height or weight on file to calculate BMI.  Treatment Plan Summary: Daily contact with patient to assess and evaluate symptoms and progress in treatment and Medication management   Lab Orders         CBC with Differential/Platelet         Comprehensive metabolic panel         Ethanol         POCT Urine Drug Screen - (I-Screen)        Recommend COWS protocol -Prn clonidine taper -hydroxyzine 25 mg PO every 6 hours prn for anxiety -ondansetron 4 mg ODT every 6 hours prn nausea/vomiting -naproxen 500 mg BID prn for pain -dicyclomine 20 mg PO every 6 hours prn abdominal cramping -loperamide 2-4 mg capsule prn diarrhea -methocarbamol 500 mg PO every 8 hours prn spasms    Home medication reordered -Biktarvy 1 tablet PO daily for HIV disease -Lexapro 20 mg p.o. daily for depressive symptoms. -Keppra 150 0 mg p.o. twice daily for seizures -Zyprexa 7.5 mg p.o. nightly for AH/Psychosis   Other PRNs -Tylenol, Maalox, MOM, trazodone   - NicoDerm CQ 24hours 14 mg transdermal patch for nicotine dependence   Dispo: Discharge IRC tomorrow   Signed: Lorri Frederick, MD  06/07/2023 4:46 PM

## 2023-06-07 NOTE — ED Notes (Signed)
Patient observed resting quietly, eyes closed. Respirations equal and unlabored. Will continue to monitor for safety.  

## 2023-06-07 NOTE — ED Notes (Signed)
Pt alert and watching television at this hour. No apparent distress. RR even and unlabored. Monitored for safety.

## 2023-06-07 NOTE — ED Notes (Signed)
Progress note   D: Pt seen on unit. Pt denies SI, HI, AVH. Pt rates pain  10/10 as chronic neuropathic pain in his back and neck. Pt endorses anxiety. "They told me I would be transferred to [facility]. When will that happen?" Pt states he wants placement but also doesn't want to miss his in-person appointment this Saturday to apply for disability. No other concerns noted at this time.  A: Pt provided support and encouragement. Hourly checks for safety.   R: Pt safe on the unit. Will continue to monitor.

## 2023-06-07 NOTE — ED Notes (Signed)
Pt awake calm and cooperative but does have c/o lights staying on and television being turned off@2300  he denies SI/HI/AVH will continue to monitor for safety

## 2023-06-07 NOTE — Progress Notes (Signed)
LCSW Progress Note  [id number]   Justin [name]  06/07/2023  1:06 PM  Description:   Inpatient Psychiatric Referral  Patient was recommended inpatient per . There are no available beds at [facility], per [facility] Rona Ravens RN. Patient was referred to the following out of network facilities:   [facility] Provider Address Phone Fax  [facility]  [address], [place] [address] [phone]  [facility] Patient Placement  [facility], [place] [phone]  [facility]  [address] [place] [address] [phone]  [facility] [address]  [address], [place] [address] [phone]  [facility]  [address], [place] [address] [phone]  [facility] Center[facility]  [address] [place] [address] [phone]  [facility]  [address] [place], [place] [address] [phone]  [facility]  [address] [place] [address] [phone]  [facility]  [address], [place] [address] [phone]  [facility]  [address] [place] [address] [phone]  [facility]  [address], [place] [address] [phone]  [facility]  [address] [place] [address] [phone]  [facility]  [place] [phone]  [facility]  [address] [place] [address] [phone]  [facility]  [address], [place] [address] [phone]  [facility]FA[facility]  [address] [place], [place] [phone]  [facility]  [address], [place] [address] [phone]  [facility]  [address], [place] [address] [phone]  [facility]  [address], [place] [address] [phone]  [facility]  [address] [place], [place] [address] [phone]  [facility]  [address], [place] [address] [phone]  [facility] Inpatient EFAX  [place] [phone]       Situation ongoing, CSW to continue following and update chart as more information becomes available.      Justin [name], MSW, LCSW  06/07/2023 1:06 PM

## 2023-06-07 NOTE — ED Notes (Signed)
[facility] paged for report

## 2023-06-07 NOTE — Progress Notes (Signed)
Patient reports that he continues to be depressed but is not experiencing any visual or auditory hallucinations.  He is calm and cooperative with questions, expresses some increased anxiety related to an appointment on Saturday at 0900. His sponsor reports this appointment is with [facility].  He reports that he has been attempting to establishing a disability claim and has to be at this appointment for his "mental and physical" assessment.  Justin [name] has demonstrated great strides in maintaining his sobriety this year.  He has gained weight, has not displayed aggressive nor delusional behaviors in the last several presentations to this facility.  He is interested in seeking additional assistance with group therapy and medication stabilization in the facility based crisis unit after he has attended his assessment.

## 2023-06-07 NOTE — Progress Notes (Signed)
Pt has been accepted to [facility] for today 06/07/2023 Bed assignment: Main campus  Pt meets inpatient criteria per: Malachy Chamber NP   Attending Physician will be Loni Beckwith, MD  Report can be called to: [phone] (this is a pager, please leave call-back number when giving report)  Pt can arrive after today pending medication for 7 days prior to DC from [facility].   Care Team Notified: Malachy Chamber NP, Fonnie Jarvis RN  [place] Letetia Romanello LCSW-A   06/07/2023 1:50 PM

## 2023-06-07 NOTE — Progress Notes (Signed)
Patient has agreed to be admitted to the Facility Based Crisis unit tomorrow.  His provider will speak with him again in the morning to confirm this decision.

## 2023-06-07 NOTE — ED Notes (Signed)
Pt asleep at this hour. No apparent distress. RR even and unlabored. Monitored for safety.  

## 2023-06-07 NOTE — Group Note (Addendum)
Group Topic:  Group Date:  Start Time:  End Time:  Facilitators:  Department:    Name:  Date of Birth: 1981-05-16  MR:     Level of Participation: Patient did not attend group Quality of Participation: Interactions with others: Mood/Affect:  Triggers (if applicable):  Cognition:  Progress:  Response:  Plan:  Patients Problems:  Patient Active Problem List   Diagnosis Date Noted   Cannabis use disorder 05/07/2023   MDD (major depressive disorder) 05/04/2023   Severe episode of recurrent major depressive disorder, with psychotic features (HCC) 05/03/2023   Suicidal thoughts 05/03/2023   Homicidal ideation 04/07/2023   Cannabis abuse 04/07/2023   Suicidal ideation 04/07/2023   Homelessness 03/30/2023   Cannabis use disorder, severe, dependence (HCC) 03/30/2023   Malingering 03/30/2023   Housing instability due to imminent risk of homelessness 03/03/2023   URI with cough and congestion 03/03/2023   MDD (major depressive disorder), recurrent severe, without psychosis (HCC) 01/18/2023   MDD (major depressive disorder), recurrent, severe, 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/06/2023   Seizure (HCC) 12/24/2021   Seizure disorder (HCC) 10/28/2021   Seizures (HCC) 07/31/2021   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

## 2023-06-08 ENCOUNTER — Other Ambulatory Visit (HOSPITAL_COMMUNITY): Payer: Self-pay

## 2023-06-08 ENCOUNTER — Other Ambulatory Visit (HOSPITAL_COMMUNITY)
Admission: EM | Admit: 2023-06-08 | Discharge: 2023-06-10 | Disposition: A | Discharge status: Home / Self Care | Payer: MEDICAID | Attending: Psychiatry | Admitting: Psychiatry

## 2023-06-08 DIAGNOSIS — Z818 Family history of other mental and behavioral disorders: Secondary | ICD-10-CM | POA: Insufficient documentation

## 2023-06-08 DIAGNOSIS — F259 Schizoaffective disorder, unspecified: Secondary | ICD-10-CM | POA: Diagnosis not present

## 2023-06-08 DIAGNOSIS — F1590 Other stimulant use, unspecified, uncomplicated: Secondary | ICD-10-CM | POA: Diagnosis not present

## 2023-06-08 DIAGNOSIS — F1520 Other stimulant dependence, uncomplicated: Secondary | ICD-10-CM

## 2023-06-08 DIAGNOSIS — B20 Human immunodeficiency virus [HIV] disease: Secondary | ICD-10-CM

## 2023-06-08 DIAGNOSIS — Z21 Asymptomatic human immunodeficiency virus [HIV] infection status: Secondary | ICD-10-CM | POA: Insufficient documentation

## 2023-06-08 DIAGNOSIS — F1924 Other psychoactive substance dependence with psychoactive substance-induced mood disorder: Secondary | ICD-10-CM | POA: Insufficient documentation

## 2023-06-08 DIAGNOSIS — B1920 Unspecified viral hepatitis C without hepatic coma: Secondary | ICD-10-CM | POA: Insufficient documentation

## 2023-06-08 DIAGNOSIS — F1914 Other psychoactive substance abuse with psychoactive substance-induced mood disorder: Secondary | ICD-10-CM | POA: Diagnosis not present

## 2023-06-08 DIAGNOSIS — F1994 Other psychoactive substance use, unspecified with psychoactive substance-induced mood disorder: Secondary | ICD-10-CM

## 2023-06-08 DIAGNOSIS — G40909 Epilepsy, unspecified, not intractable, without status epilepticus: Secondary | ICD-10-CM | POA: Insufficient documentation

## 2023-06-08 DIAGNOSIS — F1491 Cocaine use, unspecified, in remission: Secondary | ICD-10-CM | POA: Diagnosis not present

## 2023-06-08 DIAGNOSIS — F17210 Nicotine dependence, cigarettes, uncomplicated: Secondary | ICD-10-CM | POA: Insufficient documentation

## 2023-06-08 DIAGNOSIS — F1521 Other stimulant dependence, in remission: Secondary | ICD-10-CM

## 2023-06-08 DIAGNOSIS — F1290 Cannabis use, unspecified, uncomplicated: Secondary | ICD-10-CM

## 2023-06-08 DIAGNOSIS — Z5900 Homelessness unspecified: Secondary | ICD-10-CM

## 2023-06-08 MED ORDER — ESCITALOPRAM OXALATE 10 MG PO TABS
20.0000 mg | ORAL_TABLET | Freq: Every day | ORAL | Status: DC
  Administered 2023-06-09 – 2023-06-10 (×2): 20 mg via ORAL
  Filled 2023-06-08 (×2): qty 2

## 2023-06-08 MED ORDER — BIKTARVY 50-200-25 MG PO TABS: 1.0000 | ORAL_TABLET | Freq: Every day | ORAL | 0 refills | Status: DC

## 2023-06-08 MED ORDER — GABAPENTIN 300 MG PO CAPS: 300.0000 mg | ORAL_CAPSULE | Freq: Three times a day (TID) | ORAL | Status: DC | PRN

## 2023-06-08 MED ORDER — OLANZAPINE 7.5 MG PO TABS
7.5000 mg | ORAL_TABLET | Freq: Every day | ORAL | Status: DC
  Administered 2023-06-08 – 2023-06-09 (×2): 7.5 mg via ORAL
  Filled 2023-06-08 (×2): qty 1

## 2023-06-08 MED ORDER — HYDROXYZINE PAMOATE 50 MG PO CAPS: 50.0000 mg | ORAL_CAPSULE | Freq: Four times a day (QID) | ORAL | Status: DC | PRN

## 2023-06-08 MED ORDER — TRAZODONE HCL 100 MG PO TABS
100.0000 mg | ORAL_TABLET | Freq: Once | ORAL | Status: AC
  Administered 2023-06-08: 100 mg via ORAL
  Filled 2023-06-08: qty 1

## 2023-06-08 MED ORDER — ALUM & MAG HYDROXIDE-SIMETH 200-200-20 MG/5ML PO SUSP: 30.0000 mL | ORAL | Status: DC | PRN

## 2023-06-08 MED ORDER — MAGNESIUM HYDROXIDE 400 MG/5ML PO SUSP
30.0000 mL | Freq: Every day | ORAL | Status: DC | PRN
Start: 2023-06-08 — End: 2023-06-10

## 2023-06-08 MED ORDER — HYDROXYZINE HCL 25 MG PO TABS: 25.0000 mg | ORAL_TABLET | Freq: Four times a day (QID) | ORAL | Status: DC | PRN

## 2023-06-08 MED ORDER — BICTEGRAVIR-EMTRICITAB-TENOFOV 50-200-25 MG PO TABS
1.0000 | ORAL_TABLET | Freq: Every day | ORAL | Status: DC
  Administered 2023-06-09 – 2023-06-10 (×2): 1 via ORAL
  Filled 2023-06-08 (×2): qty 1

## 2023-06-08 MED ORDER — LEVETIRACETAM 500 MG PO TABS
1500.0000 mg | ORAL_TABLET | Freq: Two times a day (BID) | ORAL | Status: DC
  Administered 2023-06-08 – 2023-06-10 (×4): 1500 mg via ORAL
  Filled 2023-06-08 (×4): qty 3

## 2023-06-08 MED ORDER — TRAZODONE HCL 50 MG PO TABS
50.0000 mg | ORAL_TABLET | Freq: Every evening | ORAL | Status: DC | PRN
  Administered 2023-06-08: 50 mg via ORAL
  Filled 2023-06-08: qty 1

## 2023-06-08 MED ORDER — ACETAMINOPHEN 325 MG PO TABS: 650.0000 mg | ORAL_TABLET | Freq: Four times a day (QID) | ORAL | Status: DC | PRN

## 2023-06-08 NOTE — Group Note (Signed)
Group Topic: Social Support  Group Date: 06/08/2023 Start Time: 1220 End Time: 1250 Facilitators: Prentice Docker, RN  Department: [facility]  Number of Participants: 1  Group Focus: check in Treatment Modality:  Individual Therapy Interventions utilized were orientation Purpose: express feelings and increase insight  Name: Justin [name] Date of Birth: [birth year]  MR: [id number]    Level of Participation: active Quality of Participation: attentive, cooperative, engaged, and motivated Interactions with others: gave feedback Mood/Affect: appropriate and positive Triggers (if applicable): being homeless Cognition: coherent/clear, insightful, and logical Progress: Significant Response: "I am tired of living this way. 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   Cannabis use disorder 05/07/2023   MDD (major depressive disorder) 05/04/2023   Severe episode of recurrent major depressive disorder, with psychotic features (HCC) 05/03/2023   Suicidal thoughts 05/03/2023   Homicidal ideation 04/07/2023   Cannabis abuse 04/07/2023   Suicidal ideation 04/07/2023   Homelessness 03/30/2023   Cannabis use disorder, severe, dependence (HCC) 03/30/2023   Malingering 03/30/2023   Housing instability due to imminent risk of homelessness 03/03/2023   URI with cough and congestion 03/03/2023   MDD (major depressive disorder), recurrent severe, without psychosis (HCC) 01/18/2023   MDD (major depressive disorder), recurrent, severe, 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/06/2023   Seizure (HCC) 12/24/2021   Seizure disorder (HCC) 10/28/2021   Seizures (HCC) 07/31/2021   

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
## 2023-06-08 NOTE — ED Notes (Signed)
Pt transferred from observation unit to [facility] requesting residential tx due to meth abuse. Pt states, "I don't need to detox. I last used about 2 weeks ago. I'm over that part. I just need some help getting into a longterm facility. I prefer to go outside of [place], far away from [place]. I'm tired being on these streets sleeping and waking up using". Praise and support provided. Calm, cooperative throughout interview process. Skin assessment completed. Oriented to unit. Meal and drink offered. Pt denies SI/HI/AVH. Pt verbally contract for safety. Will monitor for safety.

## 2023-06-08 NOTE — ED Notes (Signed)
Patient observed resting quietly, eyes closed. Respirations equal and unlabored. Will continue to monitor for safety.  

## 2023-06-08 NOTE — ED Notes (Signed)
Pt asleep at this hour. No apparent distress. RR even and unlabored. Monitored for safety.  

## 2023-06-08 NOTE — ED Notes (Signed)
Patient in the Dayroom watching TV with other patients. Seen pleasant and NAD. Respirations are even and unlabored. Will continue to monitor for safety.

## 2023-06-08 NOTE — ED Provider Notes (Signed)
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  135 - 145 mmol/L Final   Potassium 04/01/2023 4.1  3.5 - 5.1 mmol/L Final   Chloride 04/01/2023 106  98 - 111 mmol/L Final   CO2 04/01/2023 25  22 - 32  mmol/L Final   Glucose, Bld 04/01/2023 100 (H)  70 - 99 mg/dL Final   Glucose reference range applies only to samples taken after fasting for at least 8 hours.   BUN 04/01/2023 9  6 - 20 mg/dL Final   Creatinine, Ser 04/01/2023 1.10  0.61 - 1.24 mg/dL Final   Calcium 95/62/1308 9.5  8.9 - 10.3 mg/dL Final   Total Protein 65/78/4696 6.5  6.5 - 8.1 g/dL Final   Albumin 29/52/8413 3.9  3.5 - 5.0 g/dL Final   AST 24/40/1027 34  15 - 41 U/L Final   ALT 04/01/2023 37  0 - 44 U/L Final   Alkaline Phosphatase 04/01/2023 73  38 - 126 U/L Final   Total Bilirubin 04/01/2023 1.0  0.3 - 1.2 mg/dL Final   GFR, Estimated 04/01/2023 >60  >60 mL/min Final   Comment: (NOTE) Calculated using the CKD-EPI Creatinine Equation (2021)    Anion gap 04/01/2023 10  5 - 15 Final   Performed at [facility], [address], [place], [place] [address]  Admission on 03/29/2023, Discharged on 03/30/2023  Component Date Value Ref Range Status   Sodium 03/29/2023 139  135 - 145 mmol/L Final   Potassium 03/29/2023 3.5  3.5 - 5.1 mmol/L Final   Chloride 03/29/2023 99  98 - 111 mmol/L Final   CO2 03/29/2023 28  22 - 32 mmol/L Final   Glucose, Bld 03/29/2023 97  70 - 99 mg/dL Final   Glucose reference range applies only to samples taken after fasting for at least 8 hours.   BUN 03/29/2023 8  6 - 20 mg/dL Final   Creatinine, Ser 03/29/2023 1.07  0.61 - 1.24 mg/dL Final   Calcium 44/09/4740 9.5  8.9 - 10.3 mg/dL Final   Total Protein 59/56/3875 7.1  6.5 - 8.1 g/dL Final   Albumin 64/33/2951 4.1  3.5 - 5.0 g/dL Final   AST 88/41/6606 37  15 - 41 U/L Final   ALT 03/29/2023 41  0 - 44 U/L Final   Alkaline Phosphatase 03/29/2023 66  38 - 126 U/L Final   Total Bilirubin 03/29/2023 2.0 (H)  0.3 - 1.2 mg/dL Final   GFR, Estimated 03/29/2023 >60  >60 mL/min Final   Comment: (NOTE) Calculated using the CKD-EPI Creatinine Equation (2021)    Anion gap 03/29/2023 12  5 - 15 Final   Performed at [facility],  [address] [place], [place] [address]   Alcohol, Ethyl (B) 03/29/2023 <10  <10 mg/dL Final   Comment: (NOTE) Lowest detectable limit for serum alcohol is 10 mg/dL.  For medical purposes only. Performed at [facility], [address] [place], [place] [address]    Salicylate Lvl 03/29/2023 <7.0 (L)  7.0 - 30.0 mg/dL Final   Performed at [facility], [address], [place], [place] [address]  Acetaminophen (Tylenol), Serum 03/29/2023 <10 (L)  10 - 30 ug/mL Final   Comment: (NOTE) Therapeutic concentrations vary significantly. A range of 10-30 ug/mL  may be an effective concentration for many patients. However, some  are best treated at concentrations outside of this range. Acetaminophen concentrations >150 ug/mL at 4 hours after ingestion  and >50 ug/mL at 12 hours after ingestion are often associated with  toxic reactions.  Performed at [facility], [address] [place], [place] [address]    WBC 03/29/2023 5.6  4.0 - 10.5 K/uL Final   RBC 03/29/2023 4.92  4.22 - 5.81 MIL/uL Final   Hemoglobin 03/29/2023 15.5  13.0 - 17.0 g/dL Final   HCT 57/84/6962 46.6  39.0 - 52.0 % Final   MCV 03/29/2023 94.7  80.0 - 100.0 fL Final   MCH 03/29/2023 31.5  26.0 - 34.0 pg Final   MCHC 03/29/2023 33.3  30.0 - 36.0 g/dL Final   RDW 95/28/4132 12.0  11.5 - 15.5 % Final   Platelets 03/29/2023 231  150 - 400 K/uL Final   nRBC 03/29/2023 0.0  0.0 - 0.2 % Final   Performed at [facility], [address] [place], [place] [address]   Opiates 03/29/2023 NONE DETECTED  NONE DETECTED Final   Cocaine 03/29/2023 NONE DETECTED  NONE DETECTED Final   Benzodiazepines 03/29/2023 NONE DETECTED  NONE DETECTED Final   Amphetamines 03/29/2023 NONE DETECTED  NONE DETECTED Final   Tetrahydrocannabinol 03/29/2023 POSITIVE (A)  NONE DETECTED Final   Barbiturates 03/29/2023 NONE DETECTED  NONE DETECTED Final   Comment: (NOTE) DRUG SCREEN FOR MEDICAL PURPOSES ONLY.  IF CONFIRMATION IS  NEEDED FOR ANY PURPOSE, NOTIFY LAB WITHIN 5 DAYS.  LOWEST DETECTABLE LIMITS FOR URINE DRUG SCREEN Drug Class                     Cutoff (ng/mL) Amphetamine and metabolites    1000 Barbiturate and metabolites    200 Benzodiazepine                 200 Opiates and metabolites        300 Cocaine and metabolites        300 THC                            50 Performed at [facility], [address], [place], [place] [address]    Glucose-Capillary 03/29/2023 150 (H)  70 - 99 mg/dL Final   Glucose reference range applies only to samples taken after fasting for at least 8 hours.  Admission on 03/26/2023, Discharged on 03/26/2023  Component Date Value Ref Range Status   Glucose-Capillary 03/26/2023 86  70 - 99 mg/dL Final   Glucose reference range applies only to samples taken after fasting for at least 8 hours.  Admission on 03/26/2023, Discharged on 03/26/2023  Component Date Value Ref Range Status   Sodium 03/26/2023 135  135 - 145 mmol/L Final   Potassium 03/26/2023 3.0 (L)  3.5 - 5.1 mmol/L Final   Chloride 03/26/2023 100  98 - 111 mmol/L Final   CO2 03/26/2023 19 (L)  22 - 32 mmol/L Final   Glucose, Bld 03/26/2023 90  70 - 99 mg/dL Final   Glucose reference range applies only to samples taken after fasting for at least 8 hours.   BUN 03/26/2023 18  6 - 20 mg/dL Final   Creatinine, Ser 03/26/2023  1.26 (H)  0.61 - 1.24 mg/dL Final   Calcium 20/25/4270 9.0  8.9 - 10.3 mg/dL Final   GFR, Estimated 03/26/2023 >60  >60 mL/min Final   Comment: (NOTE) Calculated using the CKD-EPI Creatinine Equation (2021)    Anion gap 03/26/2023 16 (H)  5 - 15 Final   Performed at [facility], [address], [place], [place] [address]   WBC 03/26/2023 7.4  4.0 - 10.5 K/uL Final   RBC 03/26/2023 5.06  4.22 - 5.81 MIL/uL Final   Hemoglobin 03/26/2023 16.0  13.0 - 17.0 g/dL Final   HCT 28/31/5176 46.8  39.0 - 52.0 % Final   MCV 03/26/2023 92.5  80.0 - 100.0 fL Final   MCH 03/26/2023 31.6   26.0 - 34.0 pg Final   MCHC 03/26/2023 34.2  30.0 - 36.0 g/dL Final   RDW 16/01/3709 12.2  11.5 - 15.5 % Final   Platelets 03/26/2023 233  150 - 400 K/uL Final   nRBC 03/26/2023 0.0  0.0 - 0.2 % Final   Neutrophils Relative % 03/26/2023 49  % Final   Neutro Abs 03/26/2023 3.6  1.7 - 7.7 K/uL Final   Lymphocytes Relative 03/26/2023 37  % Final   Lymphs Abs 03/26/2023 2.8  0.7 - 4.0 K/uL Final   Monocytes Relative 03/26/2023 11  % Final   Monocytes Absolute 03/26/2023 0.8  0.1 - 1.0 K/uL Final   Eosinophils Relative 03/26/2023 2  % Final   Eosinophils Absolute 03/26/2023 0.1  0.0 - 0.5 K/uL Final   Basophils Relative 03/26/2023 1  % Final   Basophils Absolute 03/26/2023 0.0  0.0 - 0.1 K/uL Final   Immature Granulocytes 03/26/2023 0  % Final   Abs Immature Granulocytes 03/26/2023 0.02  0.00 - 0.07 K/uL Final   Performed at [facility], [address] [place], [place] [address]   Magnesium 03/26/2023 1.8  1.7 - 2.4 mg/dL Final   Performed at [facility], [address], [place], [place] [address]  There may be more visits with results that are not included.    Allergies: Patient has no known allergies.  Medications:  PTA Medications  Medication Sig   escitalopram (LEXAPRO) 20 MG tablet Take 1 tablet (20 mg total) by mouth daily. (Patient not taking: Reported on 06/06/2023)   OLANZapine (ZYPREXA) 7.5 MG tablet Take 1 tablet (7.5 mg total) by mouth at bedtime.   traZODone (DESYREL) 50 MG tablet Take 1 tablet (50 mg total) by mouth at bedtime as needed for sleep.   levETIRAcetam (KEPPRA) 750 MG tablet TAKE 2 TABLETS(1500 MG) BY MOUTH TWICE DAILY (Patient taking differently: Take 1,500 mg by mouth 2 (two) times daily.)   gabapentin (NEURONTIN) 300 MG capsule Take 300 mg by mouth 3 (three) times daily as needed (For anxiety).   hydrOXYzine (VISTARIL) 50 MG capsule Take 50 mg by mouth every 6 (six) hours as needed for anxiety.   bictegravir-emtricitabine-tenofovir AF (BIKTARVY)  50-200-25 MG TABS tablet Take 1 tablet by mouth daily.    Long Term Goals: Improvement in symptoms so as ready for discharge  Short Term Goals: Patient will verbalize feelings in meetings with treatment team members., Patient will attend at least of 50% of the groups daily., Patient will score a low risk of violence for 24 hours prior to discharge, and Patient will take medications as prescribed daily.  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  Lance Muss, MD 06/08/23  12:00 PM

## 2023-06-08 NOTE — ED Notes (Signed)
 Patient A&Ox4. Denies intent to harm self/others when asked. Denies A/VH. Patient denies any physical complaints. No acute distress observed. Routine safety checks conducted according to facility protocol. Patient agreed to notify staff if thoughts of harm toward self or others arise. Will continue to monitor for safety.

## 2023-06-08 NOTE — ED Notes (Signed)
 Pt transferring to [facility]

## 2023-06-08 NOTE — ED Notes (Signed)
Patient is in the bedroom composed and sleeping. NAD.  Respirations even and unlabored. Will continue to monitor for safety.

## 2023-06-08 NOTE — Group Note (Signed)
Group Topic: Healthy Self Image and Positive Change Nutritional Group Meeting Group Date: 06/08/2023 Start Time: 1200 End Time: 1222 Facilitators: Vonzell Schlatter B  Department: [facility]  Number of Participants: 3  Group Focus: activities of daily living skills and daily focus Treatment Modality:  Psychoeducation Interventions utilized were patient education and problem solving Purpose: increase insight and regain self-worth  Name: Justin [name] Date of Birth: 05-May-1981  MR: [id number]    Level of Participation: Pt was not on the unit at this time Quality of Participation:  Interactions with others:  Mood/Affect:  Triggers (if applicable):  Cognition:  Progress:  Response:  Plan: follow-up needed  Patients Problems:  Patient Active Problem List   Diagnosis Date Noted   Stimulant use disorder 06/08/2023   Cannabis use disorder 05/07/2023   MDD (major depressive disorder) 05/04/2023   Severe episode of recurrent major depressive disorder, with psychotic features (HCC) 05/03/2023   Suicidal thoughts 05/03/2023   Homicidal ideation 04/07/2023   Cannabis abuse 04/07/2023   Suicidal ideation 04/07/2023   Homelessness 03/30/2023   Cannabis use disorder, severe, dependence (HCC) 03/30/2023   Malingering 03/30/2023   Housing instability due to imminent risk of homelessness 03/03/2023   URI with cough and congestion 03/03/2023   MDD (major depressive disorder), recurrent severe, without psychosis (HCC) 01/18/2023   MDD (major depressive disorder), recurrent, severe, 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/06/2023   Seizure (HCC) 12/24/2021   Seizure disorder (HCC) 10/28/2021   Seizures (HCC) 07/31/2021   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   Suicidal ideations 07/08/2020   Amphetamine and psychostimulant-induced psychosis with delusions (HCC)    Amphetamine use disorder, 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

## 2023-06-09 DIAGNOSIS — Z21 Asymptomatic human immunodeficiency virus [HIV] infection status: Secondary | ICD-10-CM | POA: Insufficient documentation

## 2023-06-09 DIAGNOSIS — F17210 Nicotine dependence, cigarettes, uncomplicated: Secondary | ICD-10-CM | POA: Insufficient documentation

## 2023-06-09 DIAGNOSIS — F1924 Other psychoactive substance dependence with psychoactive substance-induced mood disorder: Secondary | ICD-10-CM | POA: Diagnosis not present

## 2023-06-09 DIAGNOSIS — G40909 Epilepsy, unspecified, not intractable, without status epilepticus: Secondary | ICD-10-CM | POA: Diagnosis not present

## 2023-06-09 DIAGNOSIS — B1920 Unspecified viral hepatitis C without hepatic coma: Secondary | ICD-10-CM | POA: Diagnosis not present

## 2023-06-09 DIAGNOSIS — Z818 Family history of other mental and behavioral disorders: Secondary | ICD-10-CM | POA: Insufficient documentation

## 2023-06-09 MED ORDER — TRAZODONE HCL 100 MG PO TABS
100.0000 mg | ORAL_TABLET | Freq: Once | ORAL | Status: AC
  Administered 2023-06-09: 100 mg via ORAL
  Filled 2023-06-09: qty 1

## 2023-06-09 NOTE — ED Notes (Signed)
Pt came to the nursing station telling writer, he is nervous and wants to talk. Writer asked pt what's going on, and pt said, he is nervous about not being able to get a residential placement, because earlier, he was told he got denied from some facilities. Writer let pt know referral has been sent to [facility] by the social worker, so he should be hopeful and try to calm down and take a day at a time. Pt sounded hopeful and thank Clinical research associate and left. Will continue to monitor for safety and provide support.

## 2023-06-09 NOTE — Discharge Instructions (Addendum)
Follow-up recommendations:  Activity:  Normal, as tolerated Diet:  Per PCP recommendation  Patient is instructed prior to discharge to: Take all medications as prescribed by his mental healthcare provider. Report any adverse effects and/or reactions from the medicines to his outpatient provider promptly. Patient has been instructed & cautioned: To not engage in alcohol and or illegal drug use while on prescription medicines.  In the event of worsening symptoms, patient is instructed to call the crisis hotline at 988, 911 and or go to the nearest ED for appropriate evaluation and treatment of symptoms. To follow-up with his primary care provider for your other medical issues, concerns and or health care needs.  

## 2023-06-09 NOTE — ED Notes (Signed)
Patient on phone in hallway speaking with a facility regarding placement. No acute distress noted. No concerns voiced. Informed patient to notify staff with any needs or assistance. Patient verbalized understanding or agreement. Safety checks in place per facility policy.

## 2023-06-09 NOTE — ED Provider Notes (Signed)
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 73  38 - 126 U/L Final   Total Bilirubin 04/01/2023 1.0  0.3 - 1.2 mg/dL Final   GFR, Estimated 04/01/2023 >60  >60 mL/min Final   Comment: (NOTE) Calculated using the CKD-EPI Creatinine Equation (2021)    Anion gap 04/01/2023 10  5 - 15 Final   Performed at [facility], [address] [place], [place] [address]  Admission on 03/29/2023, Discharged on  03/30/2023  Component Date Value Ref Range Status   Sodium 03/29/2023 139  135 - 145 mmol/L Final   Potassium 03/29/2023 3.5  3.5 - 5.1 mmol/L Final   Chloride 03/29/2023 99  98 - 111 mmol/L Final   CO2 03/29/2023 28  22 - 32 mmol/L Final   Glucose, Bld 03/29/2023 97  70 - 99 mg/dL Final   Glucose reference range applies only to samples taken after fasting for at least 8 hours.   BUN 03/29/2023 8  6 - 20 mg/dL Final   Creatinine, Ser 03/29/2023 1.07  0.61 - 1.24 mg/dL Final   Calcium 18/84/1660 9.5  8.9 - 10.3 mg/dL Final   Total Protein 63/07/6008 7.1  6.5 - 8.1 g/dL Final   Albumin 93/23/5573 4.1  3.5 - 5.0 g/dL Final   AST 22/08/5425 37  15 - 41 U/L Final   ALT 03/29/2023 41  0 - 44 U/L Final   Alkaline Phosphatase 03/29/2023 66  38 - 126 U/L Final   Total Bilirubin 03/29/2023 2.0 (H)  0.3 - 1.2 mg/dL Final   GFR, Estimated 03/29/2023 >60  >60 mL/min Final   Comment: (NOTE) Calculated using the CKD-EPI Creatinine Equation (2021)    Anion gap 03/29/2023 12  5 - 15 Final   Performed at [facility], [address], [place], [place] [address]   Alcohol, Ethyl (B) 03/29/2023 <10  <10 mg/dL Final   Comment: (NOTE) Lowest detectable limit for serum alcohol is 10 mg/dL.  For medical purposes only. Performed at [facility], [address] [place], [place] [address]    Salicylate Lvl 03/29/2023 <7.0 (L)  7.0 - 30.0 mg/dL Final   Performed at [facility], [address], [place], [place] [address]   Acetaminophen (Tylenol), Serum 03/29/2023 <10 (L)  10 - 30 ug/mL Final   Comment: (NOTE) Therapeutic concentrations vary significantly. A range of 10-30 ug/mL  may be an effective concentration for many patients. However, some  are best treated at concentrations outside of this range. Acetaminophen concentrations >150 ug/mL at 4 hours after ingestion  and >50 ug/mL at 12 hours after ingestion are often associated with  toxic reactions.  Performed at [facility], [address], [place], [place] [address]    WBC 03/29/2023 5.6  4.0 - 10.5 K/uL Final   RBC 03/29/2023 4.92  4.22 - 5.81 MIL/uL Final   Hemoglobin 03/29/2023 15.5  13.0 -  17.0 g/dL Final   HCT 29/52/8413 46.6  39.0 - 52.0 % Final   MCV 03/29/2023 94.7  80.0 - 100.0 fL Final   MCH 03/29/2023 31.5  26.0 - 34.0 pg Final   MCHC 03/29/2023 33.3  30.0 - 36.0 g/dL Final   RDW 24/40/1027 12.0  11.5 - 15.5 % Final   Platelets 03/29/2023 231  150 - 400 K/uL Final   nRBC 03/29/2023 0.0  0.0 - 0.2 % Final   Performed at [facility], [address], [place], [place] [address]   Opiates 03/29/2023 NONE DETECTED  NONE DETECTED Final   Cocaine 03/29/2023 NONE DETECTED  NONE DETECTED Final   Benzodiazepines 03/29/2023 NONE DETECTED  NONE DETECTED Final   Amphetamines 03/29/2023 NONE DETECTED  NONE DETECTED Final   Tetrahydrocannabinol 03/29/2023 POSITIVE (A)  NONE DETECTED Final   Barbiturates 03/29/2023 NONE DETECTED  NONE DETECTED Final   Comment: (NOTE) DRUG SCREEN FOR MEDICAL PURPOSES ONLY.  IF CONFIRMATION IS NEEDED FOR ANY PURPOSE, NOTIFY LAB WITHIN 5 DAYS.  LOWEST DETECTABLE LIMITS FOR URINE DRUG SCREEN Drug Class                     Cutoff (ng/mL) Amphetamine and metabolites    1000 Barbiturate and metabolites    200 Benzodiazepine                 200 Opiates and metabolites        300 Cocaine and metabolites        300 THC                            50 Performed at [facility], [address], [place], [place] [address]    Glucose-Capillary 03/29/2023 150 (H)  70 - 99 mg/dL Final   Glucose reference range applies only to samples taken after fasting for at least 8 hours.  Admission on 03/26/2023, Discharged on 03/26/2023  Component Date Value Ref Range Status   Glucose-Capillary 03/26/2023 86  70 - 99 mg/dL Final   Glucose reference range applies only to samples taken after fasting for at least 8 hours.  Admission on 03/26/2023, Discharged on 03/26/2023   Component Date Value Ref Range Status   Sodium 03/26/2023 135  135 - 145 mmol/L Final   Potassium 03/26/2023 3.0 (L)  3.5 - 5.1 mmol/L Final   Chloride 03/26/2023 100  98 - 111 mmol/L Final   CO2 03/26/2023 19 (L)  22 - 32 mmol/L Final   Glucose, Bld 03/26/2023 90  70 - 99 mg/dL Final   Glucose reference range applies only to samples taken after fasting for at least 8 hours.   BUN 03/26/2023 18  6 - 20 mg/dL Final   Creatinine, Ser 03/26/2023 1.26 (H)  0.61 - 1.24 mg/dL Final   Calcium 74/25/9563 9.0  8.9 - 10.3 mg/dL Final   GFR, Estimated 03/26/2023 >60  >60 mL/min Final   Comment: (NOTE) Calculated using the CKD-EPI Creatinine Equation (2021)    Anion gap 03/26/2023 16 (H)  5 - 15 Final   Performed at [facility], [address], [place], [place] [address]   WBC 03/26/2023 7.4  4.0 - 10.5 K/uL Final   RBC 03/26/2023 5.06  4.22 - 5.81 MIL/uL Final   Hemoglobin 03/26/2023 16.0  13.0 - 17.0 g/dL Final   HCT 33/29/5188 46.8  39.0 - 52.0 % Final   MCV 03/26/2023  92.5  80.0 - 100.0 fL Final   MCH 03/26/2023 31.6  26.0 - 34.0 pg Final   MCHC 03/26/2023 34.2  30.0 - 36.0 g/dL Final   RDW 57/84/6962 12.2  11.5 - 15.5 % Final   Platelets 03/26/2023 233  150 - 400 K/uL Final   nRBC 03/26/2023 0.0  0.0 - 0.2 % Final   Neutrophils Relative % 03/26/2023 49  % Final   Neutro Abs 03/26/2023 3.6  1.7 - 7.7 K/uL Final   Lymphocytes Relative 03/26/2023 37  % Final   Lymphs Abs 03/26/2023 2.8  0.7 - 4.0 K/uL Final   Monocytes Relative 03/26/2023 11  % Final   Monocytes Absolute 03/26/2023 0.8  0.1 - 1.0 K/uL Final   Eosinophils Relative 03/26/2023 2  % Final   Eosinophils Absolute 03/26/2023 0.1  0.0 - 0.5 K/uL Final   Basophils Relative 03/26/2023 1  % Final   Basophils Absolute 03/26/2023 0.0  0.0 - 0.1 K/uL Final   Immature Granulocytes 03/26/2023 0  % Final   Abs Immature Granulocytes 03/26/2023 0.02  0.00 - 0.07 K/uL Final   Performed at [facility], [address],  [place], [place] [address]   Magnesium 03/26/2023 1.8  1.7 - 2.4 mg/dL Final   Performed at [facility], [address], [place], [place] [address]  There may be more visits with results that are not included.    Blood Alcohol level:  Lab Results  Component Value Date   ETH <10 05/04/2023   ETH <10 04/07/2023    Metabolic Disorder Labs: Lab Results  Component Value Date   HGBA1C 4.7 (L) 04/18/2020   MPG 88 04/18/2020   No results found for: "PROLACTIN" Lab Results  Component Value Date   CHOL 173 03/03/2023   TRIG 103 03/03/2023   HDL 44 03/03/2023   CHOLHDL 3.9 03/03/2023   VLDL 8 04/18/2020   LDLCALC 109 (H) 03/03/2023   LDLCALC 97 08/03/2022    Therapeutic Lab Levels: No results found for: "LITHIUM" No results found for: "VALPROATE" No results found for: "CBMZ"  Physical Findings   AUDIT    Flowsheet Row ED from 06/08/2023 in [facility]  Alcohol Use Disorder Identification Test Final Score (AUDIT) 22      PHQ2-9    Flowsheet Row ED from 06/08/2023 in [facility] ED from 06/05/2023 in [facility] ED from 05/04/2023 in [facility] Office Visit from 04/25/2023 in [facility] Office Visit from 08/17/2022 in [facility]  PHQ-2 Total Score 2 2 0 1 1  PHQ-9 Total Score 11 11 2  -- --      Flowsheet Row ED from 06/08/2023 in [facility] ED from 06/05/2023 in [facility] ED from 05/04/2023 in [facility]  C-SSRS RISK CATEGORY No Risk High Risk High Risk        Musculoskeletal  Strength & Muscle Tone: within normal limits Gait & Station: normal Patient leans: N/A  Psychiatric Specialty Exam  Presentation  General Appearance:  Appropriate  for Environment  Eye Contact: Fair  Speech: Clear and Coherent; Normal Rate  Speech Volume: Normal  Handedness: -- (not assessed)   Mood and Affect  Mood: Euthymic  Affect: Congruent; Appropriate  Thought Process  Thought Processes: Linear; Coherent; Goal Directed  Descriptions of Associations:Intact  Orientation:Full (Time, Place and Person)  Thought Content:Logical; WDL  Diagnosis of Schizophrenia or Schizoaffective disorder in past: No    Hallucinations:Hallucinations: None  Ideas of Reference:None  Suicidal Thoughts:Suicidal Thoughts: No  Homicidal Thoughts:Homicidal Thoughts: No   Sensorium  Memory: Remote Good  Judgment: Fair  Insight: Fair   Art therapist  Concentration: Good  Attention Span: Good  Recall: Good  Fund of Knowledge: Good  Language: Good   Psychomotor Activity  Psychomotor Activity:Psychomotor Activity: Normal   Assets  Assets: Communication Skills; Desire for Improvement; Resilience   Sleep  Sleep:Sleep: Fair     Physical Exam   Physical Exam  General: Pleasant, well-appearing. No acute distress. Pulmonary: Normal effort. No wheezing or rales. Skin: No obvious rash or lesions. Neuro: A&Ox3.No focal deficit.   Review of Systems  No reported symptoms   Blood pressure 114/79, pulse 70, temperature (!) 97.4 F (36.3 C), temperature source Oral, resp. rate 18, SpO2 97%. There is no height or weight on file to calculate BMI.  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

## 2023-06-09 NOTE — ED Notes (Signed)
 Patient is in the bedroom composed and sleeping. NAD.  Respirations even and unlabored. Will continue to monitor for safety.

## 2023-06-09 NOTE — ED Notes (Signed)
 Pt was provided lunch

## 2023-06-09 NOTE — ED Notes (Signed)
 Patient alert & oriented x4. Denies intent to harm self or others when asked. Denies A/VH. Patient denies any physical complaints when asked. No acute distress noted. Support and encouragement provided. Routine safety checks conducted per facility protocol. Encouraged patient to notify staff if any thoughts of harm towards self or others arise. Patient verbalizes understanding and agreement.

## 2023-06-09 NOTE — ED Notes (Signed)
Patient resting with eyes closed in no apparent acute distress. Respirations even and unlabored. Environment secured. Safety checks in place according to facility policy.

## 2023-06-09 NOTE — ED Notes (Addendum)
Pt became argumentative towards Child psychotherapist in regards to his placement and preceded to use profanity towards the Child psychotherapist. Patient is currently pacing the unit mumbling profanity still. Will continue to monitor for safety.

## 2023-06-09 NOTE — Group Note (Signed)
Group Topic: Recovery Basics  Group Date: 06/09/2023 Start Time: 1000 End Time: 1100 Facilitators: Londell Moh, NT  Department: [facility]  Number of Participants: 3  Group Focus: check in, coping skills, and daily focus Treatment Modality:  Psychoeducation Interventions utilized were clarification, exploration, and patient education Purpose: increase insight  Name: Justin [name] Date of Birth: 04/27/1981  MR: [id number]    Level of Participation: Pt did not attend group Patients Problems:  Patient Active Problem List   Diagnosis Date Noted   Stimulant use disorder 06/08/2023   Cannabis use disorder 05/07/2023   MDD (major depressive disorder) 05/04/2023   Severe episode of recurrent major depressive disorder, with psychotic features (HCC) 05/03/2023   Suicidal thoughts 05/03/2023   Homicidal ideation 04/07/2023   Cannabis abuse 04/07/2023   Suicidal ideation 04/07/2023   Homelessness 03/30/2023   Cannabis use disorder, severe, dependence (HCC) 03/30/2023   Malingering 03/30/2023   Housing instability due to imminent risk of homelessness 03/03/2023   URI with cough and congestion 03/03/2023   MDD (major depressive disorder), recurrent severe, without psychosis (HCC) 01/18/2023   MDD (major depressive disorder), recurrent, severe, 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/06/2023   Seizure (HCC) 12/24/2021   Seizure disorder (HCC) 10/28/2021   Seizures (HCC) 07/31/2021   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   Suicidal ideations 07/08/2020   Amphetamine and psychostimulant-induced psychosis with delusions (HCC)    Amphetamine use disorder, 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

## 2023-06-09 NOTE — ED Notes (Signed)
Spirituality group facilitated by Chaplain Orlene Salmons, MDiv, BCC.  Group Description:  Group focused on topic of hope.  Patients participated in facilitated discussion around topic, connecting with one another around experiences and definitions for hope.  Group members engaged with visual explorer photos, reflecting on what hope looks like for them today.  Group engaged in discussion around how their definitions of hope are present today in hospital.   Modalities: Psycho-social ed, Adlerian, Narrative, MI Patient Progress: DID NOT ATTEND  

## 2023-06-09 NOTE — Group Note (Signed)
Group Topic: Understanding Self  Group Date: 06/09/2023 Start Time: 0730 End Time: 0800 Facilitators: Debe Coder, NT  Department: [facility]  Number of Participants: 5  Group Focus: coping skills Treatment Modality:  Individual Therapy Interventions utilized were problem solving Purpose: express feelings  Name: Justin [name] Date of Birth: 01-02-1981  MR: [id number]    Level of Participation: active Quality of Participation: engaged Interactions with others: gave feedback Mood/Affect: appropriate Triggers (if applicable):  Cognition: coherent/clear Progress: Significant Response:  Plan: referral / recommendations  Patients Problems:  Patient Active Problem List   Diagnosis Date Noted   Stimulant use disorder 06/08/2023   Cannabis use disorder 05/07/2023   MDD (major depressive disorder) 05/04/2023   Severe episode of recurrent major depressive disorder, with psychotic features (HCC) 05/03/2023   Suicidal thoughts 05/03/2023   Homicidal ideation 04/07/2023   Cannabis abuse 04/07/2023   Suicidal ideation 04/07/2023   Homelessness 03/30/2023   Cannabis use disorder, severe, dependence (HCC) 03/30/2023   Malingering 03/30/2023   Housing instability due to imminent risk of homelessness 03/03/2023   URI with cough and congestion 03/03/2023   MDD (major depressive disorder), recurrent severe, without psychosis (HCC) 01/18/2023   MDD (major depressive disorder), recurrent, severe, 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/06/2023   Seizure (HCC) 12/24/2021   Seizure disorder (HCC) 10/28/2021   Seizures (HCC) 07/31/2021   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   Suicidal ideations 07/08/2020   Amphetamine and psychostimulant-induced psychosis with delusions (HCC)    Amphetamine use disorder, 

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
## 2023-06-09 NOTE — ED Notes (Signed)
Patient pacing hallway following conversation regarding placement following which patient became verbally aggressive towards social worker and walked off with use of profanity.

## 2023-06-09 NOTE — ED Notes (Signed)
Pt was provided breakfast.

## 2023-06-09 NOTE — ED Notes (Signed)
Pt was provided dinner.

## 2023-06-09 NOTE — ED Notes (Addendum)
Patient resting with eyes closed in no apparent acute distress. Respirations even and unlabored. Environment secured. Safety checks in place according to facility policy.   **amended due to wrong patient charting

## 2023-06-10 ENCOUNTER — Ambulatory Visit (HOSPITAL_COMMUNITY)
Admission: EM | Admit: 2023-06-10 | Discharge: 2023-06-10 | Disposition: A | Discharge status: Home / Self Care | Payer: MEDICAID

## 2023-06-10 DIAGNOSIS — F1924 Other psychoactive substance dependence with psychoactive substance-induced mood disorder: Secondary | ICD-10-CM | POA: Diagnosis not present

## 2023-06-10 DIAGNOSIS — F1910 Other psychoactive substance abuse, uncomplicated: Secondary | ICD-10-CM

## 2023-06-10 DIAGNOSIS — F17210 Nicotine dependence, cigarettes, uncomplicated: Secondary | ICD-10-CM | POA: Diagnosis not present

## 2023-06-10 DIAGNOSIS — B1920 Unspecified viral hepatitis C without hepatic coma: Secondary | ICD-10-CM | POA: Diagnosis not present

## 2023-06-10 DIAGNOSIS — G40909 Epilepsy, unspecified, not intractable, without status epilepticus: Secondary | ICD-10-CM | POA: Diagnosis not present

## 2023-06-10 DIAGNOSIS — F259 Schizoaffective disorder, unspecified: Secondary | ICD-10-CM

## 2023-06-10 NOTE — ED Notes (Signed)
Patient awake lying in bed.  He is quiet and calm.  D/c pending for 11am.  Will monitor until then.

## 2023-06-10 NOTE — Progress Notes (Signed)
   06/10/23 1259  [facility] Triage Screening (Walk-ins at [facility] only)  How Did You Hear About [place]? Self  What Is the Reason for Your Visit/Call Today? Justin [name] is a 42 year old male presenting to [facility] unaccompanied. Pt reports he has at a disability meeting this morning and had to be discharged from [facility], but is now seeking to be readmitted. Pt dneis substance use, Si, Hi and AVH.  How Long Has This Been Causing You Problems? > than 6 months  Have You Recently Had Any Thoughts About Hurting Yourself? No  Are You Planning to Commit Suicide/Harm Yourself At This time? No  Have you Recently Had Thoughts About Hurting Someone [name]? No  Are You Planning To Harm Someone At This Time? No  Physical Abuse Denies  Verbal Abuse Denies  Sexual Abuse Denies  Exploitation of patient/patient's resources Denies  Self-Neglect Denies  Are you currently experiencing any auditory, visual or other hallucinations? No  Have You Used Any Alcohol or Drugs in the Past 24 Hours? No  Do you have any current medical co-morbidities that require immediate attention? No  What Do You Feel Would Help You the Most Today? Medication(s)  If access to [facility] was not available, would you have sought care in the Emergency Department? No  Options For Referral Facility-Based Crisis;Medication Management

## 2023-06-10 NOTE — Discharge Instructions (Addendum)
Please Follow up with Outpatient Services  [facility] (Therapy only) (takes Medicaid and most major insurances) [place]:  [address] [place], [place] [address] Phone: [phone] [place]:  [address], [place], [place] [address]   Phone: [phone] [place]:  [address], [place], [place] [address]  Phone: [phone]  [facility] Behavioral Health Outpatient Clinics:   (will take occasional Medicaid. Takes most major insurances in network) [place]: [address], [place] Phone:[phone]  [place]: [address], [place] Phone:[phone] [name]: [address] [employer] Phone: [phone] [place]: [phone] [address], [place] Phone: [phone]  [facility]  (takes sliding scale and Medicaid as well as other major insurances)  [place]:   [address] [place] [address]  Phone: [phone] [place]:   [facility] [address] [place], [place] [address]   Phone: [phone]  [facility]  (medication, therapy, intensive in home services)  [address] [place], [place] [address] [phone]  [facility] (Therapy only)  Specializes in [place], perinatal mood disorders, anxiety and depression [address] [place], [place] [address] [phone]  [facility] (MST Intensive in-home services) [address] [place], [place] [address] [phone]  [facility]  (intensive in home services) [address] [place], [place] [address] [phone]  [facility]  (medication and therapy) [address], [place], [place] [address] [place], [place] [address] [phone]  [facility] [address] [place], [place] [address] Website:  info@[facility] .org Phone: [phone] Fax: [phone]  [facility] (medication) [address], [place],  [place] [address] Phone: [phone]  [facility] (intensive in-home, medication management) [address] [place], [place] [address] [phone]  [facility]  (specializes in trauma therapy) [address] Schwartz  [place], [place] [address] [phone]  [facility] (Psychological testing) [address],  [place], [place] [address] [phone]  [facility]  [address] [place], [place] [address] Phone:  [phone] Website:  [email or web address] .[email or web address] Address:  [address] [place], [place] [address] Phone:  [phone]  [facility] Address: [phone] [address] [place] [address] Phone [phone]    Some of the services offered:  Partial hospitalization program (PHP) Intensive outpatient program (IOP) Substance abuse intensive outpatient (SAIOP) PTSD a Trauma

## 2023-06-10 NOTE — ED Notes (Signed)
Pt discharged with  AVS.  AVS reviewed prior to discharge.  Pt alert, oriented, and ambulatory.  Safety maintained.  °

## 2023-06-10 NOTE — ED Provider Notes (Signed)
Behavioral Health Urgent Care Medical Screening Exam  Patient Name: Justin [name] MRN: [id number] Date of Evaluation: 06/10/23 Chief Complaint:   Diagnosis:  Final diagnoses:  Schizoaffective disorder, unspecified type (HCC)  Polysubstance abuse (HCC)    History of Present illness: Justin [name], 42 y.o., male patient seen face to face by this provider, consulted with Dr. Jannifer [place]; and chart reviewed on 06/10/23.  On evaluation Justin [name] reports that he wants treatment and to be able to go to a residential program. Per patient he was told by [facility] staff yesterday that he would be able to leave [facility] and the [facility] to attend his disability hearing, and then he would be able to return back to [facility] once his hearing was over. Patient denies SI/HI/AVH, and repeatedly says "I just want to return to the [facility] and get treatment to be able to go to a residential program.   During evaluation Kadian D Shilling is sitting in the assessment room and appears to be in no acute distress.  He is alert, oriented x 4, calm, cooperative and attentive.  His mood is euthymic with congruent affect.  He has normal speech, and behavior.  Objectively there is no evidence of psychosis/mania or delusional thinking.  Patient is able to converse coherently, goal directed thoughts, no distractibility, or pre-occupation. He denies suicidal/self-harm/homicidal ideation, psychosis, and paranoia.   At this time Justin [name] is educated and verbalizes understanding of mental health resources and other crisis services in the community. He is instructed to call 911 and present to the nearest emergency room should he experience any suicidal/homicidal ideation, auditory/visual/hallucinations, or detrimental worsening of his mental health condition.   Flowsheet Row ED from 06/10/2023 in [facility] ED from 06/08/2023 in [facility] ED from 06/05/2023 in [facility]  C-SSRS RISK CATEGORY Error: Q3, 4, or 5 should not be populated when Q2 is No No Risk High Risk       Psychiatric Specialty Exam  Presentation  General Appearance:Appropriate for Environment  Eye Contact:Good  Speech:Clear and Coherent  Speech Volume:Normal  Handedness:Right   Mood and Affect  Mood: Euthymic  Affect: Appropriate   Thought Process  Thought Processes: Coherent  Descriptions of Associations:Intact  Orientation:Full (Time, Place and Person)  Thought Content:Logical  Diagnosis of Schizophrenia or Schizoaffective disorder in past: No   Hallucinations:None Pt reports hearing voices that tell him wha to believe none  Ideas of Reference:None  Suicidal Thoughts:No With Plan Without Plan  Homicidal Thoughts:No Without Intent With Intent   Sensorium  Memory: Immediate Good; Recent Fair  Judgment: Fair  Insight: Fair   Art therapist  Concentration: Good  Attention Span: Good  Recall: Good  Fund of Knowledge: Good  Language: Good   Psychomotor Activity  Psychomotor Activity: Normal   Assets  Assets: Communication Skills; Social Support; Desire for Improvement   Sleep  Sleep: Fair  Number of hours:  5   Physical Exam: Physical Exam Vitals and nursing note reviewed. Exam conducted with a chaperone present.  Neurological:     Mental Status: He is alert.  Psychiatric:        Attention and Perception: Attention normal.        Mood and Affect: Mood is anxious.        Speech: Speech normal.        Behavior: Behavior is agitated.        Thought Content: Thought content normal.  Cognition and Memory: Memory normal.    Review of Systems  Constitutional: Negative.   Psychiatric/Behavioral:  Positive for substance abuse.    Blood pressure 129/79, pulse 83, temperature 97.8 F (36.6 C), temperature source Oral, resp. rate 20, SpO2 100%. There is no height or weight on file  to calculate BMI.  Musculoskeletal: Strength & Muscle Tone: within normal limits Gait & Station: normal Patient leans: N/A   [facility] MSE Discharge Disposition for Follow up and Recommendations: Based on my evaluation the patient does not appear to have an emergency medical condition and can be discharged with resources and follow up care in outpatient services for substance abuse. Patient advised to follow up on Monday 06/12/23 for residential treatment help.  Patient is psychiatrically cleared. Patient case review and discussed with Dr. Jannifer [place], and patient does not meet inpatient criteria for inpatient psychiatric treatment. At time of discharge, patient denies SI, HI, AVH. He demonstrated no overt evidence of psychosis or mania. Patient was instructed to call 911 or return to the emergency room if they experienced any concerning symptoms after discharge. Patient voiced understanding and agreed to the above.  Patient given resources to follow up with behavioral health urgent care for therapy and medication management. Patient denies access to weapons. Safety planning completed.    Justin [name], PMHNP 06/10/2023, 6:55 PM

## 2023-06-10 NOTE — ED Notes (Signed)
Patient is sleeping. Respirations equal and unlabored, skin warm and dry. No change in assessment or acuity. Routine safety checks conducted according to facility protocol. Will continue to monitor for safety.   

## 2023-06-14 ENCOUNTER — Emergency Department (HOSPITAL_COMMUNITY)
Admission: EM | Admit: 2023-06-14 | Discharge: 2023-06-16 | Disposition: A | Discharge status: Home / Self Care | Payer: MEDICAID | Attending: Emergency Medicine | Admitting: Emergency Medicine

## 2023-06-14 ENCOUNTER — Other Ambulatory Visit: Payer: Self-pay

## 2023-06-14 ENCOUNTER — Encounter (HOSPITAL_COMMUNITY): Payer: Self-pay | Admitting: Emergency Medicine

## 2023-06-14 DIAGNOSIS — L03114 Cellulitis of left upper limb: Secondary | ICD-10-CM | POA: Diagnosis not present

## 2023-06-14 DIAGNOSIS — F332 Major depressive disorder, recurrent severe without psychotic features: Secondary | ICD-10-CM | POA: Diagnosis present

## 2023-06-14 DIAGNOSIS — R443 Hallucinations, unspecified: Secondary | ICD-10-CM

## 2023-06-14 DIAGNOSIS — M79602 Pain in left arm: Secondary | ICD-10-CM

## 2023-06-14 DIAGNOSIS — R45851 Suicidal ideations: Secondary | ICD-10-CM

## 2023-06-14 DIAGNOSIS — F259 Schizoaffective disorder, unspecified: Secondary | ICD-10-CM | POA: Diagnosis present

## 2023-06-14 DIAGNOSIS — F1510 Other stimulant abuse, uncomplicated: Secondary | ICD-10-CM | POA: Insufficient documentation

## 2023-06-14 LAB — COMPREHENSIVE METABOLIC PANEL
ALT: 33 U/L (ref 0–44)
AST: 29 U/L (ref 15–41)
Albumin: 4.6 g/dL (ref 3.5–5.0)
Alkaline Phosphatase: 81 U/L (ref 38–126)
Anion gap: 14 (ref 5–15)
BUN: 16 mg/dL (ref 6–20)
CO2: 22 mmol/L (ref 22–32)
Calcium: 9.5 mg/dL (ref 8.9–10.3)
Chloride: 100 mmol/L (ref 98–111)
Creatinine, Ser: 0.96 mg/dL (ref 0.61–1.24)
GFR, Estimated: >60 mL/min (ref >60)
Glucose, Bld: 64 mg/dL — ABNORMAL LOW (ref 70–99)
Potassium: 3.2 mmol/L — ABNORMAL LOW (ref 3.5–5.1)
Sodium: 136 mmol/L (ref 135–145)
Total Bilirubin: 3.5 mg/dL — ABNORMAL HIGH (ref <1.2)
Total Protein: 8.2 g/dL — ABNORMAL HIGH (ref 6.5–8.1)

## 2023-06-14 LAB — CBC
HCT: 51.4 % (ref 39.0–52.0)
Hemoglobin: 16.9 g/dL (ref 13.0–17.0)
MCH: 31.7 pg (ref 26.0–34.0)
MCHC: 32.9 g/dL (ref 30.0–36.0)
MCV: 96.4 fL (ref 80.0–100.0)
Platelets: 236 10*3/uL (ref 150–400)
RBC: 5.33 MIL/uL (ref 4.22–5.81)
RDW: 11.8 % (ref 11.5–15.5)
WBC: 8.2 10*3/uL (ref 4.0–10.5)
nRBC: 0 % (ref 0.0–0.2)

## 2023-06-14 LAB — ETHANOL: Alcohol, Ethyl (B): <10 mg/dL (ref <10)

## 2023-06-14 LAB — ACETAMINOPHEN LEVEL: Acetaminophen (Tylenol), Serum: <10 ug/mL — ABNORMAL LOW (ref 10–30)

## 2023-06-14 LAB — SALICYLATE LEVEL: Salicylate Lvl: <7 mg/dL — ABNORMAL LOW (ref 7.0–30.0)

## 2023-06-14 MED ORDER — LIDOCAINE 5 % EX PTCH
1.0000 | MEDICATED_PATCH | CUTANEOUS | Status: DC
Start: 2023-06-14 — End: 2023-06-16
  Administered 2023-06-14 – 2023-06-15 (×2): 1 via TRANSDERMAL
  Filled 2023-06-14 (×4): qty 1

## 2023-06-14 MED ORDER — IBUPROFEN 800 MG PO TABS
800.0000 mg | ORAL_TABLET | Freq: Once | ORAL | Status: AC
Start: 2023-06-14 — End: 2023-06-14
  Administered 2023-06-14: 800 mg via ORAL
  Filled 2023-06-14: qty 1

## 2023-06-14 MED ORDER — LORAZEPAM 1 MG PO TABS
2.0000 mg | ORAL_TABLET | Freq: Once | ORAL | Status: AC
  Administered 2023-06-14: 2 mg via ORAL
  Filled 2023-06-14: qty 2

## 2023-06-14 NOTE — BH Assessment (Addendum)
There is a problem with the cart.  This clinician or co-worker will try to see patient once problem is resolved.

## 2023-06-14 NOTE — ED Triage Notes (Addendum)
Patient BIB [facility] c/o SI and left arm pain. Patient report he is been off with his psych meds. Patient report hearing voices to kill himself. Patient report 10/10 Left arm pain. Patient denies HI.

## 2023-06-14 NOTE — ED Provider Notes (Signed)
[place] EMERGENCY DEPARTMENT AT [facility] Provider Note   CSN: [id number] Arrival date & time: 06/14/23  1451     History  Chief Complaint  Patient presents with   Suicidal   Left Arm Pain    Justin [name] is a 42 y.o. male here presenting with left arm pain and hallucinations.  Patient has chronic auditory hallucinations.  Patient states that the voices keep on telling him to kill himself.  Patient does have multiple ED visits as well as psych visits for similar complaints.  Patient states that today he had noticed pain in the left biceps area.  He denies heavy lifting or trauma to the arm.  The history is provided by the patient.       Home Medications Prior to Admission medications   Not on File      Allergies    Patient has no known allergies.    Review of Systems   Review of Systems  Musculoskeletal:        Left arm pain  All other systems reviewed and are negative.   Physical Exam Updated Vital Signs BP 121/80 (BP Location: Right Arm)   Pulse 95   Temp 97.6 F (36.4 C) (Oral)   Resp 20   SpO2 100%  Physical Exam Vitals and nursing note reviewed.  Constitutional:      Comments: Slightly agitated and hallucinating  HENT:     Head: Normocephalic.     Nose: Nose normal.     Mouth/Throat:     Mouth: Mucous membranes are moist.  Eyes:     Extraocular Movements: Extraocular movements intact.     Pupils: Pupils are equal, round, and reactive to light.  Cardiovascular:     Rate and Rhythm: Normal rate and regular rhythm.     Pulses: Normal pulses.     Heart sounds: Normal heart sounds.  Pulmonary:     Effort: Pulmonary effort is normal.     Breath sounds: Normal breath sounds.  Abdominal:     General: Abdomen is flat.     Palpations: Abdomen is soft.  Musculoskeletal:     Cervical back: Normal range of motion and neck supple.     Comments: Has some tenderness over the left biceps area.  No obvious deformity and in particular no  signs of biceps rupture.  Patient has no bony tenderness of the humerus or elbow or forearm or shoulder.  Skin:    General: Skin is warm.     Capillary Refill: Capillary refill takes less than 2 seconds.  Neurological:     General: No focal deficit present.     Mental Status: He is alert.  Psychiatric:     Comments: Poor judgment     ED Results / Procedures / Treatments   Labs (all labs ordered are listed, but only abnormal results are displayed) Labs Reviewed  COMPREHENSIVE METABOLIC PANEL  ETHANOL  SALICYLATE LEVEL  ACETAMINOPHEN LEVEL  CBC  RAPID URINE DRUG SCREEN, HOSP PERFORMED    EKG None  Radiology No results found.  Procedures Procedures    Medications Ordered in ED Medications  ibuprofen (ADVIL) tablet 800 mg (has no administration in time range)  lidocaine (LIDODERM) 5 % 1 patch (has no administration in time range)    ED Course/ Medical Decision Making/ A&P  Medical Decision Making Justin [name] is a 42 y.o. male here presenting with auditory hallucination telling him to kill himself and left biceps pain.  Patient likely has muscle spasms of the left biceps.  He does not have any deformity to suggest biceps tendon rupture.  Patient has no trauma or injury.  Patient is a known drug abuser and requesting pain medicine.  I told him that I can try to give him anti-inflammatories as well as lidocaine patch and would like to hold off on pain medicine.  Will also get psych clearance labs and consult TTS  9:05 PM Labs unremarkable.  Serum tox is negative.  Patient is cleared for psych eval  Problems Addressed: Hallucination: chronic illness or injury Pain of left upper extremity: acute illness or injury  Amount and/or Complexity of Data Reviewed Labs: ordered.  Risk Prescription drug management.    Final Clinical Impression(s) / ED Diagnoses Final diagnoses:  None    Rx / DC Orders ED Discharge Orders     None          Charlynne Pander, MD 06/14/23 2105

## 2023-06-14 NOTE — ED Notes (Signed)
Patient very agitated getting his blood. RN tried to stick him but patient keep moving his arm. Patient stated No more blood. I dont want any poke no more. Just give X ray my left arm.

## 2023-06-14 NOTE — ED Notes (Signed)
NT attempted to stick x3. Unable to get it.

## 2023-06-14 NOTE — ED Notes (Signed)
Pt changed out into psych scrubs. Belongings have been placed behind nurses station of 1-8.

## 2023-06-15 ENCOUNTER — Encounter (HOSPITAL_COMMUNITY): Payer: Self-pay | Admitting: Psychiatric/Mental Health

## 2023-06-15 DIAGNOSIS — F332 Major depressive disorder, recurrent severe without psychotic features: Secondary | ICD-10-CM | POA: Diagnosis not present

## 2023-06-15 LAB — RAPID URINE DRUG SCREEN, HOSP PERFORMED
Amphetamines: POSITIVE — AB
Barbiturates: NOT DETECTED
Benzodiazepines: NOT DETECTED
Cocaine: NOT DETECTED
Opiates: NOT DETECTED
Tetrahydrocannabinol: NOT DETECTED

## 2023-06-15 MED ORDER — HYDROXYZINE HCL 25 MG PO TABS: 25.0000 mg | ORAL_TABLET | Freq: Three times a day (TID) | ORAL | Status: DC | PRN

## 2023-06-15 MED ORDER — OLANZAPINE 5 MG PO TBDP
7.5000 mg | ORAL_TABLET | Freq: Every day | ORAL | Status: DC
  Administered 2023-06-15: 7.5 mg via ORAL
  Filled 2023-06-15: qty 2

## 2023-06-15 MED ORDER — LORAZEPAM 1 MG PO TABS
2.0000 mg | ORAL_TABLET | Freq: Once | ORAL | Status: AC
  Administered 2023-06-15: 2 mg via ORAL
  Filled 2023-06-15: qty 2

## 2023-06-15 MED ORDER — TRAZODONE HCL 100 MG PO TABS: 100.0000 mg | ORAL_TABLET | Freq: Every evening | ORAL | Status: DC | PRN

## 2023-06-15 MED ORDER — SULFAMETHOXAZOLE-TRIMETHOPRIM 800-160 MG PO TABS
1.0000 | ORAL_TABLET | Freq: Every day | ORAL | Status: DC
  Administered 2023-06-15 – 2023-06-16 (×2): 1 via ORAL
  Filled 2023-06-15 (×2): qty 1

## 2023-06-15 MED ORDER — ESCITALOPRAM OXALATE 10 MG PO TABS
20.0000 mg | ORAL_TABLET | Freq: Every day | ORAL | Status: DC
  Administered 2023-06-16: 20 mg via ORAL
  Filled 2023-06-15: qty 2

## 2023-06-15 NOTE — ED Notes (Signed)
Nurse received pt resting in bed. Nurse observed natural raise and fall of pt chest.
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
## 2023-06-15 NOTE — ED Provider Notes (Signed)
Emergency Medicine Observation Re-evaluation Note  Justin [name] is a 42 y.o. male, seen on rounds today.  Pt initially presented to the ED for complaints of Suicidal and Left Arm Pain Currently, the patient is calm and cooperative.  Physical Exam  BP 95/84 (BP Location: Right Arm)   Pulse (!) 117   Temp 98.5 F (36.9 C) (Oral)   Resp 18   SpO2 98%  Physical Exam General: Awake. Alert. No acute distress Cardiac: Regular rate rhythm Lungs: Clear to auscultation bilaterally Skin: Small area of erythema and increased warmth over left proximal arm without drainage or fluctuance.  No significant tenderness to palpation Psych: Calm and cooperative  ED Course / MDM  EKG:   I have reviewed the labs performed to date as well as medications administered while in observation.  Recent changes in the last 24 hours include no acute behavioral events.  Started on Bactrim for cellulitis of left upper extremity.  Plan  Current plan is for continued boarding in the ED awaiting placement.    Royanne Foots, DO 06/15/23 1614

## 2023-06-15 NOTE — ED Notes (Signed)
Nurse received call from RN Antonietta Jewel from [facility] regarding possible admission for tomorrow after 8am. Report given.

## 2023-06-15 NOTE — ED Notes (Signed)
Nurse received below communication regarding care. Nurse paged number listed in communication. . . . . [name] Pt was accepted to [facility] ; Bed Assignment Main [facility] Fax Number: [phone] (Adult)  Pt meets inpatient criteria per Alona Bene, PMHNP  Attending Physician will be Dr. Loni Beckwith   Report can be called to:[phone]-Pager number, please leave a returned phone number to receive a phone call back.   Pt can arrive after 11:00am  Care Team notified: Abbe Amsterdam

## 2023-06-15 NOTE — ED Notes (Signed)
Pt complaining of left bicep and lower left arm pain. Pt's lower left arm near bicep noted to be red, swollen, and hot to touch. Vitals obtained. MD Elayne Snare made aware as well.

## 2023-06-15 NOTE — Progress Notes (Signed)
LCSW Progress Note  [id number]   Justin [name]  06/15/2023  9:25 PM    Inpatient Behavioral Health Placement  Pt meets inpatient criteria per Alona Bene, PMHNP. There are no available beds within [facility] per [facility] [employer]. Referral was sent to the following facilities;   Destination  Service Provider Address Phone [facility] [address] [place], [place] [address] [phone]  [facility] [address], [place] [address] [phone]  [facility] [address] [place] [address] [phone]  [facility] [address] [place] [address] [phone]  [facility] [address] [name], [place] [address] [phone]  [facility] [address] [place] [address] [phone]  [facility] [address] [place], [place] [address] [phone]  [facility] [address] [place] [address] [phone]  [facility] [address] [place] [address] [phone]  [facility] [address] [place] [address] [phone]  [facility] [address], [place] [address] [phone]  [facility] [address] [place] [address] [phone]  [facility] [address], [place] [address] [phone]  [facility] [place] [phone]  [facility] [address] [place] [address] [phone]  [facility] EFAX [address] [place], [place]  [phone]  [facility] [address] [place] [address] [phone]  [facility] [address], [place] [address] [phone]  [facility] [address], [place] [address] [phone]  [facility] [address], [place] [address] [phone]  [facility] [address] [place] [address] [phone]  [facility] [address], [place] [address] [phone]  [facility] Inpatient [facility] [place] [phone]  [facility] [address] [place] [address] [phone]    Situation ongoing,  CSW will follow up.    [name], MSW, [facility] 06/15/2023 9:25 PM

## 2023-06-15 NOTE — ED Notes (Signed)
Pt given lunch tray.

## 2023-06-15 NOTE — ED Notes (Signed)
Nurse received call from [facility] behavioral health requesting report for consideration of possible admission. Nurse spoke to [employer].

## 2023-06-15 NOTE — ED Notes (Signed)
Pt made aware that we still need urine sample

## 2023-06-15 NOTE — ED Notes (Signed)
Medication given along with [place] sandwich

## 2023-06-15 NOTE — ED Notes (Addendum)
Meal given

## 2023-06-15 NOTE — ED Notes (Signed)
ED provider at the bedside.  

## 2023-06-15 NOTE — ED Notes (Signed)
Tried to do TTS consult but Cart 1 was having issues being able to hear and see the patient. Will try to do consult later in the morning when IT has resolved the issue.

## 2023-06-15 NOTE — ED Notes (Signed)
Pt approached Nurses stations requesting 2mg  of ativan for bedtime and sandwich. Advised pt that nurse will reach out to MD to get meds placed.

## 2023-06-15 NOTE — BH Assessment (Signed)
TTS consult will be completed by IRIS. IRIS Coordinator will communicate in establishe chat with provider name and assessment time. Thanks

## 2023-06-15 NOTE — Progress Notes (Addendum)
Pt was accepted to [facility] TOMORROW 06/16/2023 ; Bed Assignment Main [facility] Fax Number: [phone] (Adult)  Pt meets inpatient criteria per Alona Bene, PMHNP  Attending Physician will be Dr. Loni Beckwith   Report can be called to:[phone]-Pager number, please leave a returned phone number to receive a phone call back.   Pt can arrive after 11:00am  Care Team notified: Abbe Amsterdam    [name], MSW, [facility] 06/15/2023 9:48 PM

## 2023-06-16 MED ORDER — LEVETIRACETAM 500 MG PO TABS
1000.0000 mg | ORAL_TABLET | Freq: Once | ORAL | Status: AC
  Administered 2023-06-16: 1000 mg via ORAL
  Filled 2023-06-16: qty 2

## 2023-06-16 MED ORDER — POTASSIUM CHLORIDE CRYS ER 20 MEQ PO TBCR
40.0000 meq | EXTENDED_RELEASE_TABLET | Freq: Once | ORAL | Status: AC
  Administered 2023-06-16: 40 meq via ORAL
  Filled 2023-06-16: qty 2

## 2023-06-16 MED ORDER — LEVETIRACETAM 500 MG PO TABS
500.0000 mg | ORAL_TABLET | Freq: Two times a day (BID) | ORAL | Status: DC
  Administered 2023-06-16: 500 mg via ORAL
  Filled 2023-06-16: qty 1

## 2023-06-16 MED ORDER — BICTEGRAVIR-EMTRICITAB-TENOFOV 50-200-25 MG PO TABS
1.0000 | ORAL_TABLET | Freq: Every day | ORAL | Status: DC
Start: 2023-06-16 — End: 2023-06-16
  Administered 2023-06-16: 1 via ORAL
  Filled 2023-06-16: qty 1

## 2023-06-16 NOTE — ED Notes (Signed)
Patient off unit to facility per provider. Patient alert, calm, cooperative, no s/s of distress at this time.  Patient discharge information and belongings given to [place] surgeon for transportation. Patient ambulatory off unit, escorted by RN. Patient transported by [employer].

## 2023-06-16 NOTE — ED Provider Notes (Signed)
Emergency Medicine Observation Re-evaluation Note  Justin [name] is a 42 y.o. male, seen on rounds today.  Pt initially presented to the ED for complaints of Suicidal and Left Arm Pain Currently, the patient is being admitted to [facility].  Physical Exam  BP 103/73 (BP Location: Right Arm)   Pulse 89   Temp 98.4 F (36.9 C) (Oral)   Resp 16   SpO2 97%  Physical Exam Alert and in no acute distress  ED Course / MDM  EKG:   I have reviewed the labs performed to date as well as medications administered while in observation.  Recent changes in the last 24 hours include accepting to [facility].  Plan  Current plan is for transfer to [facility].    Bethann Berkshire, MD 06/16/23 1246

## 2023-06-16 NOTE — ED Notes (Signed)
Patient given lunch tray.

## 2023-06-16 NOTE — Progress Notes (Addendum)
Inpatient Behavioral Health Placement  Pt has been accepted to [facility] 06/16/2023 however per nursing Gretta Arab, RN [facility] Intake RN has advised that the following is PENDING for acceptance at [facility]:  "1.Start his Keppra. 2. Start his biktarvy. 3. Send 7 days worth of biktarvy with him to [facility]. 4. Give potassium for 3.2 "  -TOC Consult requested to assist with inquiring about the 7 days of biktarvy.   1st shift CSW Dispo to follow up with care team.  Care Team notified: Shelly Coss, Kathie Rhodes [place],LCSWA   [name], MSW, [facility] 06/16/2023 1:06 AM

## 2023-06-16 NOTE — ED Notes (Signed)
Patient given breakfast tray.

## 2023-06-16 NOTE — Discharge Planning (Signed)
RNCM contacted pt friend, Bruce to bring pt home meds for transport to [facility].  Bruce will bring asap.  Bruce voiced concern about pt possibly missing appointment with Social Services to obtain disability scheduled for 12/19.  RNCM advised to contact [facility] regularly to obtain progress on pt condition so that he may reschedule if need be.

## 2023-06-16 NOTE — ED Notes (Addendum)
Cyndia Bent, RN from [facility] called and report given.  She is requesting an update in the AM prior to patient transport.  Per her request patient needs to be sent with 7 days of biktarvy.  Chelsea, SW notified about this request.  She is going to have TOC coordinate in the AM.

## 2023-07-07 ENCOUNTER — Encounter: Payer: Self-pay | Admitting: Internal Medicine

## 2023-07-07 ENCOUNTER — Telehealth: Payer: Self-pay

## 2023-07-07 ENCOUNTER — Other Ambulatory Visit: Payer: Self-pay

## 2023-07-07 ENCOUNTER — Ambulatory Visit: Payer: MEDICAID | Admitting: Internal Medicine

## 2023-07-07 ENCOUNTER — Other Ambulatory Visit (HOSPITAL_COMMUNITY): Payer: Self-pay

## 2023-07-07 VITALS — BP 128/91 | HR 109 | Temp 97.7°F | Wt 238.0 lb

## 2023-07-07 DIAGNOSIS — B20 Human immunodeficiency virus [HIV] disease: Secondary | ICD-10-CM | POA: Diagnosis not present

## 2023-07-07 DIAGNOSIS — G40909 Epilepsy, unspecified, not intractable, without status epilepticus: Secondary | ICD-10-CM | POA: Diagnosis not present

## 2023-07-07 DIAGNOSIS — Z5900 Homelessness unspecified: Secondary | ICD-10-CM

## 2023-07-07 DIAGNOSIS — Z113 Encounter for screening for infections with a predominantly sexual mode of transmission: Secondary | ICD-10-CM

## 2023-07-07 DIAGNOSIS — F332 Major depressive disorder, recurrent severe without psychotic features: Secondary | ICD-10-CM

## 2023-07-07 MED ORDER — OLANZAPINE 10 MG PO TABS: 10.0000 mg | ORAL_TABLET | Freq: Every day | ORAL | 5 refills | Status: AC

## 2023-07-07 MED ORDER — LEVETIRACETAM 750 MG PO TABS: 750.0000 mg | ORAL_TABLET | Freq: Two times a day (BID) | ORAL | 11 refills | Status: DC

## 2023-07-07 MED ORDER — BICTEGRAVIR-EMTRICITAB-TENOFOV 50-200-25 MG PO TABS: 1.0000 | ORAL_TABLET | Freq: Every day | ORAL | 11 refills | Status: AC

## 2023-07-07 MED ORDER — QUETIAPINE FUMARATE 100 MG PO TABS: 100.0000 mg | ORAL_TABLET | Freq: Every day | ORAL | 5 refills | Status: AC

## 2023-07-07 NOTE — Telephone Encounter (Signed)
 PA for olanzapine initiated via covermymeds.   Key: [id number]  Awaiting response for next steps.   Sandie Ano, RN

## 2023-07-07 NOTE — Assessment & Plan Note (Signed)
 I have refilled his Keppra.

## 2023-07-07 NOTE — Assessment & Plan Note (Addendum)
 Reports doing well and will check his labs today to confirm that.  Refills provided for him.  Unfortunately difficult to overcome the barrier of co-pays.  I have personally spent 45 minutes involved in face-to-face and non-face-to-face activities for this patient on the day of the visit. Professional time spent includes the following activities: Preparing to see the patient (review of tests), Obtaining and/or reviewing separately obtained history (admission/discharge record), Performing a medically appropriate examination and/or evaluation , Ordering medications/tests/procedures, referring and communicating with other health care professionals, Documenting clinical information in the EMR, Independently interpreting results (not separately reported), Communicating results to the patient/family/caregiver, Counseling and educating the patient/family/caregiver and Care coordination (not separately reported).

## 2023-07-07 NOTE — Progress Notes (Signed)
   Subjective:    Patient ID: Justin [name], male    DOB: [birth year], 43 y.o.   MRN: [id number]  HPI Justin [name] is here for follow-up of HIV. He was recently in [facility] after an apparent suicidal ideation and an emergency room visit and was discharged from there.  He remains homeless at this time.  He reports being drug-free over the last month.  He has had some difficulty getting his medications due to the co-pay requirement.  He has been taking Biktarvy  daily though has been out of his Zyprexa  and Seroquel .  He was also on Lexapro  and Wellbutrin however does not feel depression is his issue as much as the schizoaffective disorder.  He did have 1 sexual encounter with unprotected receptive anal intercourse.   Review of Systems  Constitutional:  Negative for fatigue.  Skin:  Negative for rash.       Objective:   Physical Exam Eyes:     General: No scleral icterus. Pulmonary:     Effort: Pulmonary effort is normal.  Neurological:     Mental Status: He is alert.   SH: he reports being drug free        Assessment & Plan:

## 2023-07-07 NOTE — Assessment & Plan Note (Signed)
 He is going to be working with [facility] for case management.

## 2023-07-07 NOTE — Assessment & Plan Note (Signed)
 He has ongoing issues with his mental health and I will refill his medications including the olanzapine  and quetiapine .  He does not feel depression is an issue and does not want to take the Lexapro  or Wellbutrin so we will defer on that.  Had a long discussion with him regarding medication and need to stay on this.

## 2023-07-08 LAB — CT/NG RNA, TMA RECTAL
Chlamydia Trachomatis RNA: NOT DETECTED
Neisseria Gonorrhoeae RNA: NOT DETECTED

## 2023-07-08 LAB — GC/CHLAMYDIA PROBE, AMP (THROAT)
Chlamydia trachomatis RNA: NOT DETECTED
Neisseria gonorrhoeae RNA: NOT DETECTED

## 2023-07-08 LAB — C. TRACHOMATIS/N. GONORRHOEAE RNA
C. trachomatis RNA, TMA: NOT DETECTED
N. gonorrhoeae RNA, TMA: NOT DETECTED

## 2023-07-10 LAB — RPR: RPR Ser Ql: NONREACTIVE

## 2023-07-10 LAB — T-HELPER CELLS (CD4) COUNT (NOT AT ARMC)
Absolute CD4: 507 {cells}/uL (ref 490–1740)
CD4 T Helper %: 32 % (ref 30–61)
Total lymphocyte count: 1574 {cells}/uL (ref 850–3900)

## 2023-07-10 LAB — HIV-1 RNA QUANT-NO REFLEX-BLD
HIV 1 RNA Quant: NOT DETECTED {copies}/mL
HIV-1 RNA Quant, Log: NOT DETECTED {Log_copies}/mL

## 2023-07-10 NOTE — Telephone Encounter (Signed)
 PA submitted, awaiting response.   Sandie Ano, RN

## 2023-07-11 NOTE — Telephone Encounter (Signed)
 PA approved until 07/09/24. Approval faxed to pharmacy.   Sandie Ano, RN

## 2023-07-15 ENCOUNTER — Other Ambulatory Visit: Payer: Self-pay

## 2023-07-15 ENCOUNTER — Emergency Department (HOSPITAL_COMMUNITY)
Admission: EM | Admit: 2023-07-15 | Discharge: 2023-07-15 | Disposition: A | Discharge status: Home / Self Care | Payer: MEDICAID | Attending: Emergency Medicine | Admitting: Emergency Medicine

## 2023-07-15 ENCOUNTER — Encounter (HOSPITAL_COMMUNITY): Payer: Self-pay

## 2023-07-15 ENCOUNTER — Emergency Department (HOSPITAL_COMMUNITY): Payer: MEDICAID

## 2023-07-15 DIAGNOSIS — Z21 Asymptomatic human immunodeficiency virus [HIV] infection status: Secondary | ICD-10-CM | POA: Diagnosis not present

## 2023-07-15 DIAGNOSIS — R569 Unspecified convulsions: Secondary | ICD-10-CM | POA: Diagnosis present

## 2023-07-15 LAB — I-STAT CHEM 8, ED
BUN: 9 mg/dL (ref 6–20)
Calcium, Ion: 1.17 mmol/L (ref 1.15–1.40)
Chloride: 104 mmol/L (ref 98–111)
Creatinine, Ser: 1.2 mg/dL (ref 0.61–1.24)
Glucose, Bld: 96 mg/dL (ref 70–99)
HCT: 49 % (ref 39.0–52.0)
Hemoglobin: 16.7 g/dL (ref 13.0–17.0)
Potassium: 3.5 mmol/L (ref 3.5–5.1)
Sodium: 143 mmol/L (ref 135–145)
TCO2: 26 mmol/L (ref 22–32)

## 2023-07-15 NOTE — ED Provider Notes (Addendum)
 [place] EMERGENCY DEPARTMENT AT [facility] Provider Note   CSN: [id number] Arrival date & time: 07/15/23  1717     History  Chief Complaint  Patient presents with   Seizures    Justin [name] is a 43 y.o. male.  43 year old male who is well-known known to me presents after having witnessed seizure.  According to bystander, patient's seizure lasted about 45 seconds and described as tonic-clonic activity.  Patient has history of seizure disorder and is on Klonopin and states he has been compliant.  Takes 1500 mg a day.  Patient denies any use of illicit drugs currently at this time.  He had no loss of bowel or bladder function.  Brief postictal phase.  States he is back to his baseline at this time.       Home Medications Prior to Admission medications   Medication Sig Start Date End Date Taking? Authorizing Provider  bictegravir-emtricitabine -tenofovir  AF (BIKTARVY ) 50-200-25 MG TABS tablet Take 1 tablet by mouth daily. 07/07/23   Efrain Lamar ORN, MD  levETIRAcetam  (KEPPRA ) 750 MG tablet Take 1 tablet (750 mg total) by mouth 2 (two) times daily. 07/07/23   Efrain Lamar ORN, MD  OLANZapine  (ZYPREXA ) 10 MG tablet Take 1 tablet (10 mg total) by mouth at bedtime. 07/07/23   Efrain Lamar ORN, MD  QUEtiapine  (SEROQUEL ) 100 MG tablet Take 1 tablet (100 mg total) by mouth at bedtime. 07/07/23   Comer, Lamar ORN, MD      Allergies    Patient has no known allergies.    Review of Systems   Review of Systems  All other systems reviewed and are negative.   Physical Exam Updated Vital Signs BP 124/89 (BP Location: Right Arm)   Pulse 95   Temp 97.8 F (36.6 C) (Oral)   Resp 18   Ht 1.829 m (6')   Wt 108.9 kg   SpO2 100%   BMI 32.55 kg/m  Physical Exam Vitals and nursing note reviewed.  Constitutional:      General: He is not in acute distress.    Appearance: Normal appearance. He is well-developed. He is not toxic-appearing.  HENT:     Head: Normocephalic and  atraumatic.  Eyes:     General: Lids are normal.     Conjunctiva/sclera: Conjunctivae normal.     Pupils: Pupils are equal, round, and reactive to light.  Neck:     Thyroid : No thyroid  mass.     Trachea: No tracheal deviation.  Cardiovascular:     Rate and Rhythm: Normal rate and regular rhythm.     Heart sounds: Normal heart sounds. No murmur heard.    No gallop.  Pulmonary:     Effort: Pulmonary effort is normal. No respiratory distress.     Breath sounds: Normal breath sounds. No stridor. No decreased breath sounds, wheezing, rhonchi or rales.  Abdominal:     General: There is no distension.     Palpations: Abdomen is soft.     Tenderness: There is no abdominal tenderness. There is no rebound.  Musculoskeletal:        General: No tenderness. Normal range of motion.     Cervical back: Normal range of motion and neck supple.  Skin:    General: Skin is warm and dry.     Findings: No abrasion or rash.  Neurological:     General: No focal deficit present.     Mental Status: He is alert and oriented to person, place, and time.  Mental status is at baseline.     GCS: GCS eye subscore is 4. GCS verbal subscore is 5. GCS motor subscore is 6.     Cranial Nerves: No cranial nerve deficit.     Sensory: No sensory deficit.     Motor: Motor function is intact.     Gait: Gait is intact.  Psychiatric:        Attention and Perception: Attention normal.        Speech: Speech normal.        Behavior: Behavior normal.     ED Results / Procedures / Treatments   Labs (all labs ordered are listed, but only abnormal results are displayed) Labs Reviewed  I-STAT CHEM 8, ED    EKG None  Radiology No results found.  Procedures Procedures    Medications Ordered in ED Medications - No data to display  ED Course/ Medical Decision Making/ A&P                                 Medical Decision Making Amount and/or Complexity of Data Reviewed Radiology: ordered.   CT of the head  without acute findings.  Concern for possible space-occupying lesion as she does have a history of HIV.  Electrolytes are within normal limits.  Patient states his last seizure was over 3 months ago.  No seizure activity here.  Patient notes insomnia as of late.  Suspect this may have lowered his seizure threshold.  Patient will follow-up with his neurologist        Final Clinical Impression(s) / ED Diagnoses Final diagnoses:  None    Rx / DC Orders ED Discharge Orders     None         Dasie Faden, MD 07/15/23 ARTEMUS Dasie Faden, MD 07/15/23 ARTEMUS

## 2023-07-15 NOTE — ED Triage Notes (Signed)
 Pt reports having a seizure today that lasted 45 seconds. Pt stiffened up and was out of it for about 20 minutes. Pt reports taking his medication as prescribed but has taken an unprescribed klonopin.

## 2023-07-26 ENCOUNTER — Emergency Department (HOSPITAL_COMMUNITY)
Admission: EM | Admit: 2023-07-26 | Discharge: 2023-07-26 | Disposition: A | Discharge status: Home / Self Care | Payer: MEDICAID | Attending: Emergency Medicine | Admitting: Emergency Medicine

## 2023-07-26 ENCOUNTER — Other Ambulatory Visit: Payer: Self-pay

## 2023-07-26 DIAGNOSIS — R569 Unspecified convulsions: Secondary | ICD-10-CM | POA: Diagnosis present

## 2023-07-26 DIAGNOSIS — Z21 Asymptomatic human immunodeficiency virus [HIV] infection status: Secondary | ICD-10-CM | POA: Diagnosis not present

## 2023-07-26 LAB — CBC
HCT: 48.6 % (ref 39.0–52.0)
Hemoglobin: 16 g/dL (ref 13.0–17.0)
MCH: 32.8 pg (ref 26.0–34.0)
MCHC: 32.9 g/dL (ref 30.0–36.0)
MCV: 99.6 fL (ref 80.0–100.0)
Platelets: 176 10*3/uL (ref 150–400)
RBC: 4.88 MIL/uL (ref 4.22–5.81)
RDW: 13.5 % (ref 11.5–15.5)
WBC: 4.8 10*3/uL (ref 4.0–10.5)
nRBC: 0 % (ref 0.0–0.2)

## 2023-07-26 LAB — BASIC METABOLIC PANEL
Anion gap: 5 (ref 5–15)
BUN: 8 mg/dL (ref 6–20)
CO2: 24 mmol/L (ref 22–32)
Calcium: 9 mg/dL (ref 8.9–10.3)
Chloride: 111 mmol/L (ref 98–111)
Creatinine, Ser: 1.03 mg/dL (ref 0.61–1.24)
GFR, Estimated: >60 mL/min (ref >60)
Glucose, Bld: 93 mg/dL (ref 70–99)
Potassium: 5.2 mmol/L — ABNORMAL HIGH (ref 3.5–5.1)
Sodium: 140 mmol/L (ref 135–145)

## 2023-07-26 NOTE — ED Provider Notes (Signed)
[place] EMERGENCY DEPARTMENT AT [facility] Provider Note   CSN: [id number] Arrival date & time: 07/26/23  1439     History  Chief Complaint  Patient presents with   Seizures    Justin [name] is a 43 y.o. male.  Patient with history of HIV, hepatitis C, seizures presents today with concern for seizures.  He states that he was at a group home today and was taking a nap on the couch when according to staff at the group home noticed he was having a seizure and called EMS.  According to bystander, seizure lasted less than a minute and was described as tonic-clonic activity.  Patient is on Keppra daily.  States that he missed his morning dose.  Denies any illicit drug use.  Does note that he bit his tongue and urinated on himself.  Did have a brief postictal phase, however upon my evaluation he is alert and oriented.  He did not injure himself.  Of note, patient well-known to the ED for similar complaints, longstanding history of medication noncompliance.  He does state that he has never seen a neurologist for his seizures despite numerous recommendations to follow-up.  Does note that he has been compliant with his Biktarvy, last levels were undetectable. Upon my evaluation, patient without any complaints.   The history is provided by the patient. No language interpreter was used.  Seizures      Home Medications Prior to Admission medications   Medication Sig Start Date End Date Taking? Authorizing Provider  bictegravir-emtricitabine-tenofovir AF (BIKTARVY) 50-200-25 MG TABS tablet Take 1 tablet by mouth daily. 07/07/23   [name], MD  levETIRAcetam (KEPPRA) 750 MG tablet Take 1 tablet (750 mg total) by mouth 2 (two) times daily. 07/07/23   Comer, Belia Heman, MD  OLANZapine (ZYPREXA) 10 MG tablet Take 1 tablet (10 mg total) by mouth at bedtime. 07/07/23   [name], MD  QUEtiapine (SEROQUEL) 100 MG tablet Take 1 tablet (100 mg total) by mouth at bedtime. 07/07/23    Comer, Belia Heman, MD      Allergies    Patient has no known allergies.    Review of Systems   Review of Systems  Neurological:  Positive for seizures.  All other systems reviewed and are negative.   Physical Exam Updated Vital Signs BP 124/73   Pulse 65   Temp 97.9 F (36.6 C) (Oral)   Resp 15   SpO2 100%  Physical Exam Vitals and nursing note reviewed.  Constitutional:      General: He is not in acute distress.    Appearance: Normal appearance. He is normal weight. He is not ill-appearing, toxic-appearing or diaphoretic.  HENT:     Head: Normocephalic and atraumatic.     Comments: No racoon eyes No battle sign    Mouth/Throat:     Comments: Small bite mark noted to the lateral edge of the tongue Eyes:     Extraocular Movements: Extraocular movements intact.     Pupils: Pupils are equal, round, and reactive to light.  Cardiovascular:     Rate and Rhythm: Normal rate and regular rhythm.     Heart sounds: Normal heart sounds.     Comments: No tenderness to palpation of the anterior chest wall Pulmonary:     Effort: Pulmonary effort is normal. No respiratory distress.     Breath sounds: Normal breath sounds.  Abdominal:     General: Abdomen is flat.  Palpations: Abdomen is soft.     Tenderness: There is no abdominal tenderness.     Comments: No abdominal tenderness  Musculoskeletal:        General: Normal range of motion.     Cervical back: Normal and normal range of motion.     Thoracic back: Normal.     Lumbar back: Normal.     Comments: No midline tenderness, no stepoffs or deformity noted on palpation of cervical, thoracic, and lumbar spine  Skin:    General: Skin is warm and dry.  Neurological:     General: No focal deficit present.     Mental Status: He is alert and oriented to person, place, and time.     GCS: GCS eye subscore is 4. GCS verbal subscore is 5. GCS motor subscore is 6.     Sensory: Sensation is intact.     Motor: Motor function is intact.      Coordination: Coordination is intact.     Gait: Gait is intact.     Comments: Alert and oriented to self, place, time and event.    Speech is fluent, clear without dysarthria or dysphasia.    Strength 5/5 in upper/lower extremities   Sensation intact in upper/lower extremities    CN I not tested  CN II grossly intact visual fields bilaterally. Did not visualize posterior eye.  CN III, IV, VI PERRLA and EOMs intact bilaterally  CN V Intact sensation to sharp and light touch to the face  CN VII facial movements symmetric  CN VIII not tested  CN IX, X no uvula deviation, symmetric rise of soft palate  CN XI 5/5 SCM and trapezius strength bilaterally  CN XII Midline tongue protrusion, symmetric L/R movements   Psychiatric:        Mood and Affect: Mood normal.        Behavior: Behavior normal.     ED Results / Procedures / Treatments   Labs (all labs ordered are listed, but only abnormal results are displayed) Labs Reviewed  BASIC METABOLIC PANEL - Abnormal; Notable for the following components:      Result Value   Potassium 5.2 (*)    All other components within normal limits  CBC  LEVETIRACETAM LEVEL    EKG None  Radiology No results found.  Procedures Procedures    Medications Ordered in ED Medications - No data to display  ED Course/ Medical Decision Making/ A&P                                 Medical Decision Making Amount and/or Complexity of Data Reviewed Labs: ordered.   This patient is a 43 y.o. male who presents to the ED for concern of seizures, this involves an extensive number of treatment options, and is a complaint that carries with it a high risk of complications and morbidity. The emergent differential diagnosis prior to evaluation includes, but is not limited to,  The differential diagnosis for includes but is not limited to idiopathic seizure, traumatic brain injury, intracranial hemorrhage, vascular lesion, mass or space containing lesion,  degenerative neurologic disease, congenital brain abnormality, infectious etiology such as meningitis, encephalitis or abscess, metabolic disturbance including hyper or hypoglycemia, hyper or hyponatremia, hyperosmolar state, uremia, hepatic failure, hypocalcemia, hypomagnesemia.  Toxic substances such as cocaine, lidocaine, antidepressants, theophylline, alcohol withdrawal, drug withdrawal, eclampsia, hypertensive encephalopathy and anoxic brain injury, medication noncomplaince   This is not  an exhaustive differential.   Past Medical History / Co-morbidities / Social History:  has a past medical history of Chronic hepatitis C without hepatic coma (HCC) (08/17/2017), Endocarditis, History of syphilis (08/25/2014), HIV (human immunodeficiency virus infection) (HCC), and Seizures (HCC).   Additional history: Chart reviewed. Pertinent results include: Patient with 22 ER visits in the last 6 months with similar complaints and no admissions.  Last Keppra level 2 months ago was within normal range suggesting medication noncompliance.  Last CT head was 11 days ago and was normal.  CD4 counts most recently within normal limits  Physical Exam: Physical exam performed. The pertinent findings include: Alert and oriented and neurologically intact without focal deficits.  Lab Tests: I ordered, and personally interpreted labs.  The pertinent results include:  K 5.2, however likely due to hemolysis   Cardiac Monitoring:  The patient was maintained on a cardiac monitor. Cardiac monitor showed an underlying rhythm of: no STEMI, no t wave changes. I agree with this interpretation.   Disposition: After consideration of the diagnostic results and the patients response to treatment, I feel that emergency department workup does not suggest an emergent condition requiring admission or immediate intervention beyond what has been performed at this time. The plan is: Discharge with close outpatient follow-up and return  precautions.  Patient did ask for a referral to neurology for follow-up.  Given same.  Emphasized importance of compliance with home medication.  He does state that he has not been sleeping very well lately due to a poor living situation, likely lowered his seizure threshold versus noncompliance.  Keppra level is pending.  Did have slightly elevated potassium, however the lab was hemolyzed, likely not accurate recommend outpatient recheck. The patient is safe for discharge and has been instructed to return immediately for worsening symptoms, change in symptoms or any other concerns.  Final Clinical Impression(s) / ED Diagnoses Final diagnoses:  Seizure (HCC)    Rx / DC Orders ED Discharge Orders     None     An After Visit Summary was printed and given to the patient.     Silva Bandy, PA-C 07/26/23 1900    Rozelle Logan, DO 07/28/23 [id number]

## 2023-07-26 NOTE — ED Triage Notes (Signed)
Pt BIBA from a day center for witnessed seizure. Was on the couch, did not fall or hit his head. Pt has not taken his keppra today but reports compliance. Looks to have bitten the outer front edges of his tongue slightly. No other complaints than the tongue pain. Postictal about 62m on scene, back to baseline on arrival. VSS

## 2023-07-26 NOTE — Discharge Instructions (Signed)
As we discussed, your workup in the ER today was reassuring for acute findings.  Is very important that you continue to take your medications as prescribed.  Have also given you a referral return to neurology with a number to call to schedule a follow-up appointment.  Please call at your earliest convenience.  Return if development of any new or worsening symptoms.

## 2023-07-27 ENCOUNTER — Emergency Department (HOSPITAL_COMMUNITY)
Admission: EM | Admit: 2023-07-27 | Discharge: 2023-07-27 | Disposition: A | Discharge status: Home / Self Care | Payer: MEDICAID | Source: Home / Self Care | Attending: Emergency Medicine | Admitting: Emergency Medicine

## 2023-07-27 ENCOUNTER — Encounter (HOSPITAL_COMMUNITY): Payer: Self-pay

## 2023-07-27 DIAGNOSIS — R569 Unspecified convulsions: Secondary | ICD-10-CM | POA: Insufficient documentation

## 2023-07-27 DIAGNOSIS — Z21 Asymptomatic human immunodeficiency virus [HIV] infection status: Secondary | ICD-10-CM | POA: Insufficient documentation

## 2023-07-27 LAB — COMPREHENSIVE METABOLIC PANEL
ALT: 25 U/L (ref 0–44)
AST: 24 U/L (ref 15–41)
Albumin: 3.9 g/dL (ref 3.5–5.0)
Alkaline Phosphatase: 78 U/L (ref 38–126)
Anion gap: 10 (ref 5–15)
BUN: 9 mg/dL (ref 6–20)
CO2: 22 mmol/L (ref 22–32)
Calcium: 8.7 mg/dL — ABNORMAL LOW (ref 8.9–10.3)
Chloride: 107 mmol/L (ref 98–111)
Creatinine, Ser: 0.99 mg/dL (ref 0.61–1.24)
GFR, Estimated: >60 mL/min (ref >60)
Glucose, Bld: 110 mg/dL — ABNORMAL HIGH (ref 70–99)
Potassium: 3.7 mmol/L (ref 3.5–5.1)
Sodium: 139 mmol/L (ref 135–145)
Total Bilirubin: 1.5 mg/dL — ABNORMAL HIGH (ref 0.0–1.2)
Total Protein: 7.4 g/dL (ref 6.5–8.1)

## 2023-07-27 LAB — CBC WITH DIFFERENTIAL/PLATELET
Abs Immature Granulocytes: 0.02 10*3/uL (ref 0.00–0.07)
Basophils Absolute: 0 10*3/uL (ref 0.0–0.1)
Basophils Relative: 0 %
Eosinophils Absolute: 0 10*3/uL (ref 0.0–0.5)
Eosinophils Relative: 0 %
HCT: 48.7 % (ref 39.0–52.0)
Hemoglobin: 16 g/dL (ref 13.0–17.0)
Immature Granulocytes: 0 %
Lymphocytes Relative: 19 %
Lymphs Abs: 1.3 10*3/uL (ref 0.7–4.0)
MCH: 32.5 pg (ref 26.0–34.0)
MCHC: 32.9 g/dL (ref 30.0–36.0)
MCV: 99 fL (ref 80.0–100.0)
Monocytes Absolute: 0.6 10*3/uL (ref 0.1–1.0)
Monocytes Relative: 9 %
Neutro Abs: 4.8 10*3/uL (ref 1.7–7.7)
Neutrophils Relative %: 72 %
Platelets: 192 10*3/uL (ref 150–400)
RBC: 4.92 MIL/uL (ref 4.22–5.81)
RDW: 13.1 % (ref 11.5–15.5)
WBC: 6.7 10*3/uL (ref 4.0–10.5)
nRBC: 0 % (ref 0.0–0.2)

## 2023-07-27 LAB — CBG MONITORING, ED: Glucose-Capillary: 116 mg/dL — ABNORMAL HIGH (ref 70–99)

## 2023-07-27 LAB — LEVETIRACETAM LEVEL: Levetiracetam Lvl: 20.9 ug/mL (ref 10.0–40.0)

## 2023-07-27 MED ORDER — LEVETIRACETAM IN NACL 1000 MG/100ML IV SOLN: 1000.0000 mg | Freq: Once | INTRAVENOUS | Status: DC

## 2023-07-27 MED ORDER — LEVETIRACETAM IN NACL 1500 MG/100ML IV SOLN
1500.0000 mg | Freq: Once | INTRAVENOUS | Status: DC
  Filled 2023-07-27: qty 100

## 2023-07-27 MED ORDER — LEVETIRACETAM 500 MG PO TABS
1500.0000 mg | ORAL_TABLET | Freq: Once | ORAL | Status: AC
  Administered 2023-07-27: 1500 mg via ORAL
  Filled 2023-07-27: qty 3

## 2023-07-27 NOTE — Discharge Instructions (Addendum)
Please take your Keppra daily as directed. You were given a neurology referral yesterday and should follow up for further management of your seizures.

## 2023-07-27 NOTE — ED Provider Triage Note (Signed)
Emergency Medicine Provider Triage Evaluation Note  HUW DEMUTH , a 43 y.o. male  was evaluated in triage.  Pt complains of a seizure earlier today.  States he was negative did not fall or hit his head.  Reports he is on Keppra for seizures.  Last dose was this morning.  Denies any chest pain or shortness of breath.  Review of Systems  Positive: As above Negative: As above  Physical Exam  BP (!) 156/87 (BP Location: Left Arm)   Pulse 96   Temp 98.2 F (36.8 C) (Oral)   Resp 20   Ht 6' (1.829 m)   Wt 108 kg   SpO2 95%   BMI 32.29 kg/m  Gen:   Awake, no distress   Resp:  Normal effort  MSK:   Moves extremities without difficulty    Medical Decision Making  Medically screening exam initiated at 12:49 PM.  Appropriate orders placed.  Hessie Knows Scotto was informed that the remainder of the evaluation will be completed by another provider, this initial triage assessment does not replace that evaluation, and the importance of remaining in the ED until their evaluation is complete.     Arabella Merles, PA-C 07/27/23 1251

## 2023-07-27 NOTE — ED Triage Notes (Signed)
BIBA for witnessed seizure x2 15 mins PTA that last 15 secs each.  Pt denies taking seizure meds this am.  Denies hitting head.  Pt seen for same yesterday  Hx seizures

## 2023-07-27 NOTE — ED Provider Notes (Signed)
[place] EMERGENCY DEPARTMENT AT [facility] Provider Note   CSN: [id number] Arrival date & time: 07/27/23  1234     History  Chief Complaint  Patient presents with   Seizures    Justin [name] is a 43 y.o. male.  Justin [name] is a 43 y.o. male with a history of seizures, polysubstance abuse and HIV, who presents to the emergency department for evaluation of seizure.  Patient reported 2 witnessed seizures lasting about 15 seconds each about 15 minutes prior to EMS arrival.  He denies hitting his head.  Patient reports that he feels that he is back to normal now just feels a bit fatigued, denies headache, visual changes, numbness, weakness or dizziness.  Patient was seen in the ED for seizure yesterday as well.  He reports that he did not take his Keppra this morning, thinks he took it last night.  He is well-known to the department and has been seen numerous times for seizures and has a history of medication noncompliance.  The history is provided by the patient.  Seizures      Home Medications Prior to Admission medications   Medication Sig Start Date End Date Taking? Authorizing Provider  bictegravir-emtricitabine-tenofovir AF (BIKTARVY) 50-200-25 MG TABS tablet Take 1 tablet by mouth daily. 07/07/23   [name], MD  levETIRAcetam (KEPPRA) 750 MG tablet Take 1 tablet (750 mg total) by mouth 2 (two) times daily. 07/07/23   Comer, Belia Heman, MD  OLANZapine (ZYPREXA) 10 MG tablet Take 1 tablet (10 mg total) by mouth at bedtime. 07/07/23   [name], MD  QUEtiapine (SEROQUEL) 100 MG tablet Take 1 tablet (100 mg total) by mouth at bedtime. 07/07/23   Comer, Belia Heman, MD      Allergies    Patient has no known allergies.    Review of Systems   Review of Systems  Neurological:  Positive for seizures.    Physical Exam Updated Vital Signs BP (!) 140/90   Pulse 90   Temp 98 F (36.7 C) (Oral)   Resp 16   Ht 6' (1.829 m)   Wt 108 kg   SpO2 98%   BMI  32.29 kg/m  Physical Exam Vitals and nursing note reviewed.  Constitutional:      General: He is not in acute distress.    Appearance: Normal appearance. He is well-developed. He is not diaphoretic.  HENT:     Head: Normocephalic and atraumatic.  Eyes:     General:        Right eye: No discharge.        Left eye: No discharge.     Pupils: Pupils are equal, round, and reactive to light.  Cardiovascular:     Rate and Rhythm: Normal rate and regular rhythm.     Pulses: Normal pulses.     Heart sounds: Normal heart sounds.  Pulmonary:     Effort: Pulmonary effort is normal. No respiratory distress.     Breath sounds: Normal breath sounds. No wheezing or rales.     Comments: Respirations equal and unlabored, patient able to speak in full sentences, lungs clear to auscultation bilaterally  Abdominal:     General: Bowel sounds are normal. There is no distension.     Palpations: Abdomen is soft. There is no mass.     Tenderness: There is no abdominal tenderness. There is no guarding.     Comments: Abdomen soft, nondistended, nontender to palpation in all quadrants  without guarding or peritoneal signs  Musculoskeletal:        General: No deformity.     Cervical back: Neck supple.  Skin:    General: Skin is warm and dry.     Capillary Refill: Capillary refill takes less than 2 seconds.  Neurological:     Mental Status: He is alert and oriented to person, place, and time.     Coordination: Coordination normal.     Comments: Speech is clear, able to follow commands CN III-XII intact Normal strength in upper and lower extremities bilaterally including dorsiflexion and plantar flexion, strong and equal grip strength Sensation normal to light and sharp touch Moves extremities without ataxia, coordination intact  Psychiatric:        Mood and Affect: Mood normal.        Behavior: Behavior normal.     ED Results / Procedures / Treatments   Labs (all labs ordered are listed, but only  abnormal results are displayed) Labs Reviewed  COMPREHENSIVE METABOLIC PANEL - Abnormal; Notable for the following components:      Result Value   Glucose, Bld 110 (*)    Calcium 8.7 (*)    Total Bilirubin 1.5 (*)    All other components within normal limits  CBG MONITORING, ED - Abnormal; Notable for the following components:   Glucose-Capillary 116 (*)    All other components within normal limits  CBC WITH DIFFERENTIAL/PLATELET    EKG EKG Interpretation Date/Time:  Thursday July 27 2023 12:58:42 EST Ventricular Rate:  87 PR Interval:  143 QRS Duration:  99 QT Interval:  359 QTC Calculation: 432 R Axis:   -37  Text Interpretation: Sinus rhythm Left axis deviation Non-specific ST-t changes Confirmed by Cathren Laine ([id number]) on 07/27/2023 2:27:25 PM  Radiology No results found.  Procedures Procedures    Medications Ordered in ED Medications  levETIRAcetam (KEPPRA) tablet 1,500 mg (1,500 mg Oral Given 07/27/23 1453)    ED Course/ Medical Decision Making/ A&P                                 Medical Decision Making Risk Prescription drug management.   Patient seen numerous times in the ED for breakthrough seizures in the setting of medication noncompliance.  Had 2 witnessed brief self resolved seizures today and reports that he did not take his Keppra this morning.  He is currently at baseline with no focal neurologic deficits.  Basic labs are unremarkable.  Patient did not have head injury and has had numerous head CTs with recurrent ED visits so we will hold off on head imaging at this time.  Patient given oral dose of Keppra and monitored in the ED for 2-1/2 hours with no further seizure-like activity.  At this time there does not appear to be any evidence of an acute emergency medical condition requiring further emergent evaluation and the patient appears stable for discharge with appropriate outpatient follow up. Diagnosis and return precautions discussed with  patient who verbalizes understanding and is agreeable to discharge.           Final Clinical Impression(s) / ED Diagnoses Final diagnoses:  Seizure [facility])    Rx / DC Orders ED Discharge Orders     None         Velda Shell 07/27/23 1546    Cathren Laine, MD 07/28/23 [phone]

## 2023-07-28 ENCOUNTER — Inpatient Hospital Stay (HOSPITAL_COMMUNITY)
Admission: EM | Admit: 2023-07-28 | Discharge: 2023-08-01 | DRG: 101 | Disposition: A | Discharge status: Home / Self Care | Payer: MEDICAID | Attending: Internal Medicine | Admitting: Internal Medicine

## 2023-07-28 ENCOUNTER — Encounter: Payer: Self-pay | Admitting: Neurology

## 2023-07-28 ENCOUNTER — Emergency Department (HOSPITAL_COMMUNITY): Payer: MEDICAID

## 2023-07-28 ENCOUNTER — Other Ambulatory Visit: Payer: Self-pay

## 2023-07-28 ENCOUNTER — Encounter (HOSPITAL_COMMUNITY): Payer: Self-pay | Admitting: Family Medicine

## 2023-07-28 DIAGNOSIS — R569 Unspecified convulsions: Secondary | ICD-10-CM

## 2023-07-28 DIAGNOSIS — E876 Hypokalemia: Secondary | ICD-10-CM | POA: Diagnosis present

## 2023-07-28 DIAGNOSIS — B20 Human immunodeficiency virus [HIV] disease: Secondary | ICD-10-CM | POA: Diagnosis present

## 2023-07-28 DIAGNOSIS — G40909 Epilepsy, unspecified, not intractable, without status epilepticus: Principal | ICD-10-CM | POA: Diagnosis present

## 2023-07-28 DIAGNOSIS — Z79899 Other long term (current) drug therapy: Secondary | ICD-10-CM

## 2023-07-28 DIAGNOSIS — B182 Chronic viral hepatitis C: Secondary | ICD-10-CM | POA: Diagnosis present

## 2023-07-28 DIAGNOSIS — F1510 Other stimulant abuse, uncomplicated: Secondary | ICD-10-CM | POA: Diagnosis present

## 2023-07-28 DIAGNOSIS — Z5900 Homelessness unspecified: Secondary | ICD-10-CM

## 2023-07-28 DIAGNOSIS — Z833 Family history of diabetes mellitus: Secondary | ICD-10-CM

## 2023-07-28 DIAGNOSIS — F259 Schizoaffective disorder, unspecified: Secondary | ICD-10-CM | POA: Diagnosis not present

## 2023-07-28 DIAGNOSIS — F1990 Other psychoactive substance use, unspecified, uncomplicated: Secondary | ICD-10-CM | POA: Diagnosis present

## 2023-07-28 DIAGNOSIS — F1210 Cannabis abuse, uncomplicated: Secondary | ICD-10-CM | POA: Diagnosis present

## 2023-07-28 DIAGNOSIS — Z9151 Personal history of suicidal behavior: Secondary | ICD-10-CM

## 2023-07-28 DIAGNOSIS — F17290 Nicotine dependence, other tobacco product, uncomplicated: Secondary | ICD-10-CM | POA: Diagnosis present

## 2023-07-28 DIAGNOSIS — F32A Depression, unspecified: Secondary | ICD-10-CM | POA: Diagnosis present

## 2023-07-28 LAB — CBC WITH DIFFERENTIAL/PLATELET
Abs Immature Granulocytes: 0.02 10*3/uL (ref 0.00–0.07)
Basophils Absolute: 0 10*3/uL (ref 0.0–0.1)
Basophils Relative: 0 %
Eosinophils Absolute: 0 10*3/uL (ref 0.0–0.5)
Eosinophils Relative: 0 %
HCT: 46.6 % (ref 39.0–52.0)
Hemoglobin: 15.4 g/dL (ref 13.0–17.0)
Immature Granulocytes: 0 %
Lymphocytes Relative: 31 %
Lymphs Abs: 2 10*3/uL (ref 0.7–4.0)
MCH: 32.2 pg (ref 26.0–34.0)
MCHC: 33 g/dL (ref 30.0–36.0)
MCV: 97.3 fL (ref 80.0–100.0)
Monocytes Absolute: 0.7 10*3/uL (ref 0.1–1.0)
Monocytes Relative: 11 %
Neutro Abs: 3.5 10*3/uL (ref 1.7–7.7)
Neutrophils Relative %: 58 %
Platelets: 207 10*3/uL (ref 150–400)
RBC: 4.79 MIL/uL (ref 4.22–5.81)
RDW: 13 % (ref 11.5–15.5)
WBC: 6.2 10*3/uL (ref 4.0–10.5)
nRBC: 0 % (ref 0.0–0.2)

## 2023-07-28 LAB — BASIC METABOLIC PANEL
Anion gap: 10 (ref 5–15)
BUN: 7 mg/dL (ref 6–20)
CO2: 24 mmol/L (ref 22–32)
Calcium: 8.9 mg/dL (ref 8.9–10.3)
Chloride: 104 mmol/L (ref 98–111)
Creatinine, Ser: 0.88 mg/dL (ref 0.61–1.24)
GFR, Estimated: >60 mL/min (ref >60)
Glucose, Bld: 99 mg/dL (ref 70–99)
Potassium: 3.4 mmol/L — ABNORMAL LOW (ref 3.5–5.1)
Sodium: 138 mmol/L (ref 135–145)

## 2023-07-28 LAB — RAPID URINE DRUG SCREEN, HOSP PERFORMED
Amphetamines: NOT DETECTED
Barbiturates: NOT DETECTED
Benzodiazepines: NOT DETECTED
Cocaine: NOT DETECTED
Opiates: NOT DETECTED
Tetrahydrocannabinol: NOT DETECTED

## 2023-07-28 LAB — ETHANOL: Alcohol, Ethyl (B): <10 mg/dL (ref <10)

## 2023-07-28 MED ORDER — LORAZEPAM 2 MG/ML IJ SOLN
INTRAMUSCULAR | Status: AC
  Administered 2023-07-28: 1 mg via INTRAMUSCULAR
  Filled 2023-07-28: qty 1

## 2023-07-28 MED ORDER — LEVETIRACETAM 500 MG PO TABS
750.0000 mg | ORAL_TABLET | Freq: Two times a day (BID) | ORAL | Status: DC
  Administered 2023-07-29: 750 mg via ORAL
  Filled 2023-07-28: qty 1

## 2023-07-28 MED ORDER — OLANZAPINE 5 MG PO TABS
10.0000 mg | ORAL_TABLET | Freq: Every day | ORAL | Status: DC
  Administered 2023-07-29 – 2023-07-31 (×4): 10 mg via ORAL
  Filled 2023-07-28: qty 2
  Filled 2023-07-28: qty 1
  Filled 2023-07-28 (×2): qty 2

## 2023-07-28 MED ORDER — HYDROCODONE-ACETAMINOPHEN 5-325 MG PO TABS
1.0000 | ORAL_TABLET | ORAL | Status: DC | PRN
  Administered 2023-07-29: 1 via ORAL
  Administered 2023-07-30 – 2023-08-01 (×4): 2 via ORAL
  Filled 2023-07-28 (×3): qty 2
  Filled 2023-07-28: qty 1
  Filled 2023-07-28: qty 2

## 2023-07-28 MED ORDER — POTASSIUM CHLORIDE CRYS ER 20 MEQ PO TBCR
20.0000 meq | EXTENDED_RELEASE_TABLET | Freq: Once | ORAL | Status: AC
  Administered 2023-07-29: 20 meq via ORAL
  Filled 2023-07-28: qty 1

## 2023-07-28 MED ORDER — ACETAMINOPHEN 650 MG RE SUPP: 650.0000 mg | Freq: Four times a day (QID) | RECTAL | Status: DC | PRN

## 2023-07-28 MED ORDER — BICTEGRAVIR-EMTRICITAB-TENOFOV 50-200-25 MG PO TABS
1.0000 | ORAL_TABLET | Freq: Every day | ORAL | Status: DC
Start: 2023-07-29 — End: 2023-08-01
  Administered 2023-07-29 – 2023-08-01 (×4): 1 via ORAL
  Filled 2023-07-28 (×5): qty 1

## 2023-07-28 MED ORDER — LORAZEPAM 2 MG/ML IJ SOLN: 1.0000 mg | Freq: Once | INTRAMUSCULAR | Status: AC

## 2023-07-28 MED ORDER — SODIUM CHLORIDE 0.9% FLUSH
3.0000 mL | Freq: Two times a day (BID) | INTRAVENOUS | Status: DC
  Administered 2023-07-29 – 2023-08-01 (×8): 3 mL via INTRAVENOUS

## 2023-07-28 MED ORDER — ENOXAPARIN SODIUM 40 MG/0.4ML IJ SOSY
40.0000 mg | PREFILLED_SYRINGE | INTRAMUSCULAR | Status: DC
Start: 2023-07-28 — End: 2023-08-01
  Administered 2023-07-29 – 2023-07-31 (×3): 40 mg via SUBCUTANEOUS
  Filled 2023-07-28 (×3): qty 0.4

## 2023-07-28 MED ORDER — QUETIAPINE FUMARATE 100 MG PO TABS
100.0000 mg | ORAL_TABLET | Freq: Every day | ORAL | Status: DC
  Administered 2023-07-29 – 2023-07-31 (×4): 100 mg via ORAL
  Filled 2023-07-28 (×4): qty 1

## 2023-07-28 MED ORDER — LEVETIRACETAM IN NACL 1500 MG/100ML IV SOLN
1500.0000 mg | Freq: Once | INTRAVENOUS | Status: AC
  Administered 2023-07-28: 1500 mg via INTRAVENOUS
  Filled 2023-07-28: qty 100

## 2023-07-28 MED ORDER — POLYETHYLENE GLYCOL 3350 17 G PO PACK: 17.0000 g | PACK | Freq: Every day | ORAL | Status: DC | PRN

## 2023-07-28 MED ORDER — ACETAMINOPHEN 325 MG PO TABS
650.0000 mg | ORAL_TABLET | Freq: Four times a day (QID) | ORAL | Status: DC | PRN
Start: 2023-07-28 — End: 2023-08-01

## 2023-07-28 NOTE — ED Provider Notes (Signed)
[place] EMERGENCY DEPARTMENT AT [facility] Provider Note   CSN: [id number] Arrival date & time: 07/28/23  1802     History  Chief Complaint  Patient presents with   Seizures    Justin [name] is a 43 y.o. male.  43 year old male with past medical history of HIV on Hart therapy as well as seizures on Keppra and schizophrenia presenting to the emergency department today with concern for seizure.  The patient has apparently had seizures daily now over the past few days.  Reports that he was started on new medication for schizophrenia which appears to be Zyprexa.  He states that this is occurred since then.  The patient denies any fevers or chills.  Denies any nausea or vomiting.  He was apparently told by bystanders that he was confused and acting abnormally just before the seizures.  He did not remember how he got here to the emergency department.  He denies any hallucinations currently.  He does not think that he fell but cannot recall.   Seizures      Home Medications Prior to Admission medications   Medication Sig Start Date End Date Taking? Authorizing Provider  bictegravir-emtricitabine-tenofovir AF (BIKTARVY) 50-200-25 MG TABS tablet Take 1 tablet by mouth daily. 07/07/23  Yes Comer, Belia Heman, MD  levETIRAcetam (KEPPRA) 750 MG tablet Take 1 tablet (750 mg total) by mouth 2 (two) times daily. 07/07/23  Yes Comer, Belia Heman, MD  OLANZapine (ZYPREXA) 10 MG tablet Take 1 tablet (10 mg total) by mouth at bedtime. 07/07/23  Yes Comer, Belia Heman, MD  QUEtiapine (SEROQUEL) 100 MG tablet Take 1 tablet (100 mg total) by mouth at bedtime. 07/07/23  Yes Comer, Belia Heman, MD      Allergies    Patient has no known allergies.    Review of Systems   Review of Systems  Neurological:  Positive for seizures.  All other systems reviewed and are negative.   Physical Exam Updated Vital Signs BP 122/75   Pulse 76   Temp 98.1 F (36.7 C) (Oral)   Resp 20   Ht 6' (1.829 m)   Wt  108 kg   SpO2 97%   BMI 32.29 kg/m  Physical Exam Vitals and nursing note reviewed.   Gen: NAD Eyes: PERRL, EOMI HEENT: no oropharyngeal swelling Neck: trachea midline Resp: clear to auscultation bilaterally Card: RRR, no murmurs, rubs, or gallops Abd: nontender, nondistended Extremities: no calf tenderness, no edema Vascular: 2+ radial pulses bilaterally, 2+ DP pulses bilaterally Neuro: Cranial nerves intact, equal strength and sensation throughout bilateral upper and lower extremities, in the middle of evaluation the patient started with some lipsmacking and looking to the right.  He then proceeded to have generalized tonic-clonic activity in the upper and lower extremities Skin: no rashes Psyc: acting appropriately   ED Results / Procedures / Treatments   Labs (all labs ordered are listed, but only abnormal results are displayed) Labs Reviewed  BASIC METABOLIC PANEL - Abnormal; Notable for the following components:      Result Value   Potassium 3.4 (*)    All other components within normal limits  CBC WITH DIFFERENTIAL/PLATELET  RAPID URINE DRUG SCREEN, HOSP PERFORMED  ETHANOL  LEVETIRACETAM LEVEL  BASIC METABOLIC PANEL  MAGNESIUM  CBC  PHOSPHORUS    EKG None  Radiology CT Head Wo Contrast Result Date: 07/28/2023 CLINICAL DATA:  Head trauma, abnormal mental status (Age 47-64y) EXAM: CT HEAD WITHOUT CONTRAST TECHNIQUE: Contiguous axial images were obtained  from the base of the skull through the vertex without intravenous contrast. RADIATION DOSE REDUCTION: This exam was performed according to the departmental dose-optimization program which includes automated exposure control, adjustment of the mA and/or kV according to patient size and/or use of iterative reconstruction technique. COMPARISON:  Head CT 07/15/2023 FINDINGS: Brain: No intracranial hemorrhage, mass effect, or midline shift. No hydrocephalus. The basilar cisterns are patent. No evidence of territorial  infarct or acute ischemia. No extra-axial or intracranial fluid collection. Vascular: No hyperdense vessel or unexpected calcification. Skull: No fracture or focal lesion. Sinuses/Orbits: No acute finding. Other: Posterior vertex scalp scarring. IMPRESSION: No acute intracranial abnormality. Electronically Signed   By: Narda Rutherford M.D.   On: 07/28/2023 21:04    Procedures Procedures    Medications Ordered in ED Medications  bictegravir-emtricitabine-tenofovir AF (BIKTARVY) 50-200-25 MG per tablet 1 tablet (has no administration in time range)  OLANZapine (ZYPREXA) tablet 10 mg (has no administration in time range)  QUEtiapine (SEROQUEL) tablet 100 mg (has no administration in time range)  levETIRAcetam (KEPPRA) tablet 750 mg (has no administration in time range)  enoxaparin (LOVENOX) injection 40 mg (has no administration in time range)  sodium chloride flush (NS) 0.9 % injection 3 mL (has no administration in time range)  potassium chloride SA (KLOR-CON M) CR tablet 20 mEq (has no administration in time range)  acetaminophen (TYLENOL) tablet 650 mg (has no administration in time range)    Or  acetaminophen (TYLENOL) suppository 650 mg (has no administration in time range)  HYDROcodone-acetaminophen (NORCO/VICODIN) 5-325 MG per tablet 1-2 tablet (has no administration in time range)  polyethylene glycol (MIRALAX / GLYCOLAX) packet 17 g (has no administration in time range)  LORazepam (ATIVAN) injection 1 mg (1 mg Intramuscular Given 07/28/23 1831)  levETIRAcetam (KEPPRA) IVPB 1500 mg/ 100 mL premix (0 mg Intravenous Stopped 07/28/23 2100)    ED Course/ Medical Decision Making/ A&P                                 Medical Decision Making 43 year old male with past medical history of HIV on Hart therapy as well as seizures and schizophrenia presenting to the emergency department today with seizures.  The patient did have a seizure while was evaluating him.  This lasted for roughly 1  minute.  The patient is given IM Ativan and IV access is established.  He was still mildly confused but appears to have stopped seizing so was given 1 mg of Ativan.  I will further evaluate him here with basic labs Wels a CT scan of his head to evaluate for possible injury.  Will give him a Keppra load here.  I will discuss his case with neurology see about going up on his medications as he reports that he has been taking his Keppra as prescribed.  The patient's labs are reassuring.  CT scan is unremarkable.  I did call and discussed the patient's case with Dr. Otelia Limes.  He recommends admission and Keppra level which is ordered.  A call was placed to hospital service for admission.  Amount and/or Complexity of Data Reviewed Labs: ordered. Radiology: ordered.  Risk Prescription drug management. Decision regarding hospitalization.           Final Clinical Impression(s) / ED Diagnoses Final diagnoses:  Recurrent seizures (HCC)    Rx / DC Orders ED Discharge Orders     None  Durwin Glaze, MD 07/28/23 [phone]

## 2023-07-28 NOTE — H&P (Signed)
History and Physical    ARNOLD KESTER VQQ:[id number] DOB: May 28, 1981 DOA: 07/28/2023  PCP: Pcp, No   Patient coming from: Home   Chief Complaint: Seizure   HPI: Justin [name] is a 43 y.o. male with medical history significant for homelessness, substance abuse, HIV, seizures, and schizoaffective disorder who presents after generalized seizure.  This is the third consecutive day that the patient has been seen in the emergency department for seizures.  Today, the patient reports strict adherence with his antiepileptic.  He reports that he was having an uneventful day when he woke up with people telling him that he had had a seizure.  He reports feeling back to his baseline by time of admission and denies any recent fever, chills, chest pain, abdominal pain, or focal numbness or weakness.  He has been trying to avoid illicit substances and states that he last used methamphetamine 1 week ago.  ED Course: Upon arrival to the ED, patient is found to be afebrile and saturating well on room air with normal heart rate and stable blood pressure.  Labs are notable for potassium 3.4, normal creatinine, normal bicarbonate, normal WBC, and normal hemoglobin.  No acute findings are seen on head CT.    Patient had a brief generalized seizure in the ED, was treated with Ativan, and loaded with 1500 mg of IV Keppra.  ED physician discussed the case with neurology who recommended observing the patient in the hospital given his repeated presentations, continuing his current dose of Keppra 750 mg twice daily, following up on the Keppra level, and considering increasing the Keppra dose based on Keppra level and clinical course.   Review of Systems:  All other systems reviewed and apart from HPI, are negative.  Past Medical History:  Diagnosis Date   Chronic hepatitis C without hepatic coma (HCC) 08/17/2017   Endocarditis    History of syphilis 08/25/2014   Treated by [facility] 07-2014.   HIV (human  immunodeficiency virus infection) (HCC)    Seizures (HCC)     Past Surgical History:  Procedure Laterality Date   hand surgery Right    LUMBAR PUNCTURE  08/08/2019       WRIST SURGERY      Social History:   reports that he has been smoking e-cigarettes. He has never used smokeless tobacco. He reports current alcohol use. He reports current drug use. Drugs: Marijuana and Methamphetamines.  No Known Allergies  Family History  Adopted: Yes  Problem Relation Age of Onset   Diabetes type II Mother      Prior to Admission medications   Medication Sig Start Date End Date Taking? Authorizing Provider  bictegravir-emtricitabine-tenofovir AF (BIKTARVY) 50-200-25 MG TABS tablet Take 1 tablet by mouth daily. 07/07/23   [name], MD  levETIRAcetam (KEPPRA) 750 MG tablet Take 1 tablet (750 mg total) by mouth 2 (two) times daily. 07/07/23   Comer, Belia Heman, MD  OLANZapine (ZYPREXA) 10 MG tablet Take 1 tablet (10 mg total) by mouth at bedtime. 07/07/23   [name], MD  QUEtiapine (SEROQUEL) 100 MG tablet Take 1 tablet (100 mg total) by mouth at bedtime. 07/07/23   [name], MD    Physical Exam: Vitals:   07/28/23 1830 07/28/23 1845 07/28/23 1848 07/28/23 2145  BP: (!) 158/115 (!) 151/103  119/71  Pulse: 81 75  76  Resp: 18 13  12   Temp:      TempSrc:      SpO2: 100% 97%  97%  Weight:   108 kg   Height:   6' (1.829 m)     Constitutional: NAD, calm  Eyes: PERTLA, lids and conjunctivae normal ENMT: Mucous membranes are moist. Posterior pharynx clear of any exudate or lesions.   Neck: supple, no masses  Respiratory: no wheezing, no crackles. No accessory muscle use.  Cardiovascular: S1 & S2 heard, regular rate and rhythm. No extremity edema.   Abdomen: No distension, no tenderness, soft. Bowel sounds active.  Musculoskeletal: no clubbing / cyanosis. No joint deformity upper and lower extremities.   Skin: no significant rashes, lesions, ulcers. Warm, dry,  well-perfused. Neurologic: CN 2-12 grossly intact. Strength 5/5 in all 4 limbs. Alert and oriented.  Psychiatric: Pleasant. Cooperative.    Labs and Imaging on Admission: I have personally reviewed following labs and imaging studies  CBC: Recent Labs  Lab 07/26/23 1619 07/27/23 1253 07/28/23 1833  WBC 4.8 6.7 6.2  NEUTROABS  --  4.8 3.5  HGB 16.0 16.0 15.4  HCT 48.6 48.7 46.6  MCV 99.6 99.0 97.3  PLT 176 192 207   Basic Metabolic Panel: Recent Labs  Lab 07/26/23 1709 07/27/23 1253 07/28/23 1833  NA 140 139 138  K 5.2* 3.7 3.4*  CL 111 107 104  CO2 24 22 24   GLUCOSE 93 110* 99  BUN 8 9 7   CREATININE 1.03 0.99 0.88  CALCIUM 9.0 8.7* 8.9   GFR: Estimated Creatinine Clearance: 138.9 mL/min (by C-G formula based on SCr of 0.88 mg/dL). Liver Function Tests: Recent Labs  Lab 07/27/23 1253  AST 24  ALT 25  ALKPHOS 78  BILITOT 1.5*  PROT 7.4  ALBUMIN 3.9   No results for input(s): "LIPASE", "AMYLASE" in the last 168 hours. No results for input(s): "AMMONIA" in the last 168 hours. Coagulation Profile: No results for input(s): "INR", "PROTIME" in the last 168 hours. Cardiac Enzymes: No results for input(s): "CKTOTAL", "CKMB", "CKMBINDEX", "TROPONINI" in the last 168 hours. BNP (last 3 results) No results for input(s): "PROBNP" in the last 8760 hours. HbA1C: No results for input(s): "HGBA1C" in the last 72 hours. CBG: Recent Labs  Lab 07/27/23 1248  GLUCAP 116*   Lipid Profile: No results for input(s): "CHOL", "HDL", "LDLCALC", "TRIG", "CHOLHDL", "LDLDIRECT" in the last 72 hours. Thyroid Function Tests: No results for input(s): "TSH", "T4TOTAL", "FREET4", "T3FREE", "THYROIDAB" in the last 72 hours. Anemia Panel: No results for input(s): "VITAMINB12", "FOLATE", "FERRITIN", "TIBC", "IRON", "RETICCTPCT" in the last 72 hours. Urine analysis:    Component Value Date/Time   COLORURINE YELLOW 04/30/2023 1700   APPEARANCEUR CLEAR 04/30/2023 1700   LABSPEC 1.027  04/30/2023 1700   PHURINE 5.0 04/30/2023 1700   GLUCOSEU NEGATIVE 04/30/2023 1700   HGBUR NEGATIVE 04/30/2023 1700   BILIRUBINUR NEGATIVE 04/30/2023 1700   BILIRUBINUR negative 09/25/2014 1641   KETONESUR 20 (A) 04/30/2023 1700   PROTEINUR NEGATIVE 04/30/2023 1700   UROBILINOGEN 4.0 09/25/2014 1641   UROBILINOGEN 0.2 08/19/2014 1152   NITRITE NEGATIVE 04/30/2023 1700   LEUKOCYTESUR NEGATIVE 04/30/2023 1700   Sepsis Labs: @LABRCNTIP (procalcitonin:4,lacticidven:4) )No results found for this or any previous visit (from the past 240 hours).   Radiological Exams on Admission: CT Head Wo Contrast Result Date: 07/28/2023 CLINICAL DATA:  Head trauma, abnormal mental status (Age 56-64y) EXAM: CT HEAD WITHOUT CONTRAST TECHNIQUE: Contiguous axial images were obtained from the base of the skull through the vertex without intravenous contrast. RADIATION DOSE REDUCTION: This exam was performed according to the departmental dose-optimization program which includes automated exposure  control, adjustment of the mA and/or kV according to patient size and/or use of iterative reconstruction technique. COMPARISON:  Head CT 07/15/2023 FINDINGS: Brain: No intracranial hemorrhage, mass effect, or midline shift. No hydrocephalus. The basilar cisterns are patent. No evidence of territorial infarct or acute ischemia. No extra-axial or intracranial fluid collection. Vascular: No hyperdense vessel or unexpected calcification. Skull: No fracture or focal lesion. Sinuses/Orbits: No acute finding. Other: Posterior vertex scalp scarring. IMPRESSION: No acute intracranial abnormality. Electronically Signed   By: Narda Rutherford M.D.   On: 07/28/2023 21:04    Assessment/Plan  1. Seizures  - Third consecutive day presenting with seizures; there is concern for noncompliance with AED (vs need for dose increase), Keppra level pending, was 20.9 on 07/26/23 - Continue seizure precautions, continue/resume oral Keppra in am, monitor  for recurrence   2. HIV  - CD4 was 537 and VL <20 in October 2024  - Continue Biktarvy    3. Schizoaffective disorder  - Continue Zyprexa and Seroquel    DVT prophylaxis: Lovenox  Code Status: Full  Level of Care: Level of care: Telemetry Family Communication: None present   Disposition Plan:  Patient is from: home  Anticipated d/c is to: home  Anticipated d/c date is: 07/29/23  Patient currently: will be monitored for recurrent seizures Consults called: None  Admission status: Observation     Briscoe Deutscher, MD [facility]  07/28/2023, 10:09 PM

## 2023-07-28 NOTE — ED Notes (Addendum)
This RN and Dr. Rhae Hammock at bedside and pt began having a seizure. Seizure lasted about 45 seconds.

## 2023-07-28 NOTE — ED Triage Notes (Signed)
BIB EMS from [place] parking lot.  Pt is homeless.  Living in his friend's [name].  He was walking out and friend witnessed him fall over, pt did fall, no obvious injuries, no pain.  He had "convulsions" for about a minute.  Called 911 at 1713.  Pt arrives somewhat post ictal.  A&O x 2.  Repetitive questioning. Started new meds for schizophrenia about 3 days ago.  Has not missed any keppra doses.  Daily seizures since beginning new meds.

## 2023-07-28 NOTE — ED Notes (Signed)
Pt is A&Ox4/ pt resting in bed/ no complaints at this time

## 2023-07-29 ENCOUNTER — Observation Stay (HOSPITAL_COMMUNITY): Payer: MEDICAID

## 2023-07-29 ENCOUNTER — Other Ambulatory Visit: Payer: Self-pay

## 2023-07-29 DIAGNOSIS — B20 Human immunodeficiency virus [HIV] disease: Secondary | ICD-10-CM | POA: Diagnosis not present

## 2023-07-29 DIAGNOSIS — F259 Schizoaffective disorder, unspecified: Secondary | ICD-10-CM | POA: Diagnosis not present

## 2023-07-29 LAB — BASIC METABOLIC PANEL
Anion gap: 10 (ref 5–15)
BUN: 10 mg/dL (ref 6–20)
CO2: 23 mmol/L (ref 22–32)
Calcium: 7.5 mg/dL — ABNORMAL LOW (ref 8.9–10.3)
Chloride: 111 mmol/L (ref 98–111)
Creatinine, Ser: 0.99 mg/dL (ref 0.61–1.24)
GFR, Estimated: >60 mL/min (ref >60)
Glucose, Bld: 79 mg/dL (ref 70–99)
Potassium: 2.9 mmol/L — ABNORMAL LOW (ref 3.5–5.1)
Sodium: 144 mmol/L (ref 135–145)

## 2023-07-29 LAB — CBC
HCT: 48.6 % (ref 39.0–52.0)
Hemoglobin: 15.8 g/dL (ref 13.0–17.0)
MCH: 32.1 pg (ref 26.0–34.0)
MCHC: 32.5 g/dL (ref 30.0–36.0)
MCV: 98.8 fL (ref 80.0–100.0)
Platelets: 183 10*3/uL (ref 150–400)
RBC: 4.92 MIL/uL (ref 4.22–5.81)
RDW: 12.9 % (ref 11.5–15.5)
WBC: 6.2 10*3/uL (ref 4.0–10.5)
nRBC: 0 % (ref 0.0–0.2)

## 2023-07-29 LAB — PHOSPHORUS: Phosphorus: 3.5 mg/dL (ref 2.5–4.6)

## 2023-07-29 LAB — MAGNESIUM: Magnesium: 1.8 mg/dL (ref 1.7–2.4)

## 2023-07-29 MED ORDER — LEVETIRACETAM 500 MG PO TABS
1500.0000 mg | ORAL_TABLET | Freq: Two times a day (BID) | ORAL | Status: DC
  Administered 2023-07-29 – 2023-08-01 (×6): 1500 mg via ORAL
  Filled 2023-07-29 (×6): qty 3

## 2023-07-29 MED ORDER — GADOBUTROL 1 MMOL/ML IV SOLN
10.0000 mL | Freq: Once | INTRAVENOUS | Status: AC | PRN
  Administered 2023-07-29: 10 mL via INTRAVENOUS

## 2023-07-29 MED ORDER — LEVETIRACETAM 500 MG PO TABS
750.0000 mg | ORAL_TABLET | Freq: Once | ORAL | Status: AC
  Administered 2023-07-29: 750 mg via ORAL
  Filled 2023-07-29: qty 1

## 2023-07-29 MED ORDER — POTASSIUM CHLORIDE CRYS ER 20 MEQ PO TBCR
40.0000 meq | EXTENDED_RELEASE_TABLET | Freq: Once | ORAL | Status: AC
  Administered 2023-07-29: 40 meq via ORAL
  Filled 2023-07-29: qty 2

## 2023-07-29 MED ORDER — LEVETIRACETAM 500 MG PO TABS: 1500.0000 mg | ORAL_TABLET | Freq: Once | ORAL | Status: DC

## 2023-07-29 NOTE — Progress Notes (Signed)
[facility]  PROGRESS NOTE  Justin [name] YQI:[id number] DOB: 05/10/1981 DOA: 07/28/2023 PCP: Pcp, No   Brief HPI:   a 43 y.o. male with medical history significant for homelessness, substance abuse, HIV, seizures, and schizoaffective disorder who presents after generalized seizure.  As per patient he has been having daily seizures.  Patient says that he was started on medication for schizophrenia, likely Zyprexa.  Patients with seizures have been happening since that time.  He was told by bystanders that he was confused and acting abnormally just before the seizures. Patient had a brief generalized seizure in the ED, was treated with Ativan, and loaded with 1500 mg of IV Keppra.  ED physician discussed the case with neurology who recommended observing the patient in the hospital given his repeated presentations, continuing his current dose of Keppra 1500 mg p.o. twice daily.  Follow Keppra levels.   Assessment/Plan:   Recurrent seizures -Patient having recurrent seizures despite being on Keppra, Keppra level was 20.9 on 07/26/2023. -Continue with Keppra 1500 mg p.o. twice daily -Will consult neurology for further recommendations -Might need long-term EEG monitoring -Called and discussed with Dr. Iver Nestle, will obtain MRI brain with and without contrast.  HIV -CD4 count 537, viral load less than 20 in October 2024 -Continue Biktarvy  Schizoaffective disorder -Continue Zyprexa, Seroquel   Medications     bictegravir-emtricitabine-tenofovir AF  1 tablet Oral Daily   enoxaparin (LOVENOX) injection  40 mg Subcutaneous Q24H   levETIRAcetam  1,500 mg Oral Q12H   levETIRAcetam  750 mg Oral Once   OLANZapine  10 mg Oral QHS   QUEtiapine  100 mg Oral QHS   sodium chloride flush  3 mL Intravenous Q12H     Data Reviewed:   CBG:  Recent Labs  Lab 07/27/23 1248  GLUCAP 116*    SpO2: 98 %    Vitals:   07/29/23 0623 07/29/23 0630 07/29/23 0715 07/29/23 0735  BP: (!)  141/98 (!) 129/102 (!) 139/97 (!) 149/95  Pulse: 73   75  Resp: 16 13 12 13   Temp:    98.3 F (36.8 C)  TempSrc:    Oral  SpO2: 96% 96% 96% 98%  Weight:      Height:          Data Reviewed:  Basic Metabolic Panel: Recent Labs  Lab 07/26/23 1709 07/27/23 1253 07/28/23 1833  NA 140 139 138  K 5.2* 3.7 3.4*  CL 111 107 104  CO2 24 22 24   GLUCOSE 93 110* 99  BUN 8 9 7   CREATININE 1.03 0.99 0.88  CALCIUM 9.0 8.7* 8.9    CBC: Recent Labs  Lab 07/26/23 1619 07/27/23 1253 07/28/23 1833 07/29/23 0807  WBC 4.8 6.7 6.2 6.2  NEUTROABS  --  4.8 3.5  --   HGB 16.0 16.0 15.4 15.8  HCT 48.6 48.7 46.6 48.6  MCV 99.6 99.0 97.3 98.8  PLT 176 192 207 183    LFT Recent Labs  Lab 07/27/23 1253  AST 24  ALT 25  ALKPHOS 78  BILITOT 1.5*  PROT 7.4  ALBUMIN 3.9     Antibiotics: Anti-infectives (From admission, onward)    Start     Dose/Rate Route Frequency Ordered Stop   07/29/23 1000  bictegravir-emtricitabine-tenofovir AF (BIKTARVY) 50-200-25 MG per tablet 1 tablet        1 tablet Oral Daily 07/28/23 2209          DVT prophylaxis: Lovenox  Code Status: Full code  Family  Communication: No family at bedside   CONSULTS    Subjective   No more seizures overnight.   Objective    Physical Examination:   General-appears in no acute distress Heart-S1-S2, regular, no murmur auscultated Lungs-clear to auscultation bilaterally, no wheezing or crackles auscultated Abdomen-soft, nontender, no organomegaly Extremities-no edema in the lower extremities Neuro-alert, oriented x3, no focal deficit noted  Status is: Inpatient:             Meredeth Ide   [facility] If 7PM-7AM, please contact night-coverage at [email or web address], Office  [phone]   07/29/2023, 8:44 AM  LOS: 0 days

## 2023-07-29 NOTE — Plan of Care (Signed)

## 2023-07-29 NOTE — Progress Notes (Signed)
     Patient Name: Justin [name]           DOB: [birth year]  MRN: [id number]      Admission Date: 07/28/2023  Attending Provider: Meredeth Ide, MD  Primary Diagnosis: Seizures [facility])   Level of care: Telemetry Medical   OVERNIGHT PROGRESS REPORT  Per handoff report with Dr. Sharl Ma via secure chat, patient needs to go to [facility] for LTM EEG. I have placed transfer orders and communicated with flow manager.    Anthoney Harada, DNP, ACNPC- AG [facility]

## 2023-07-30 ENCOUNTER — Observation Stay (HOSPITAL_COMMUNITY): Payer: MEDICAID

## 2023-07-30 DIAGNOSIS — F1210 Cannabis abuse, uncomplicated: Secondary | ICD-10-CM | POA: Diagnosis present

## 2023-07-30 DIAGNOSIS — Z9151 Personal history of suicidal behavior: Secondary | ICD-10-CM | POA: Diagnosis not present

## 2023-07-30 DIAGNOSIS — G40909 Epilepsy, unspecified, not intractable, without status epilepticus: Secondary | ICD-10-CM | POA: Diagnosis present

## 2023-07-30 DIAGNOSIS — Z833 Family history of diabetes mellitus: Secondary | ICD-10-CM | POA: Diagnosis not present

## 2023-07-30 DIAGNOSIS — Z79899 Other long term (current) drug therapy: Secondary | ICD-10-CM | POA: Diagnosis not present

## 2023-07-30 DIAGNOSIS — B20 Human immunodeficiency virus [HIV] disease: Secondary | ICD-10-CM | POA: Diagnosis present

## 2023-07-30 DIAGNOSIS — Z5900 Homelessness unspecified: Secondary | ICD-10-CM | POA: Diagnosis not present

## 2023-07-30 DIAGNOSIS — F17290 Nicotine dependence, other tobacco product, uncomplicated: Secondary | ICD-10-CM | POA: Diagnosis present

## 2023-07-30 DIAGNOSIS — R569 Unspecified convulsions: Secondary | ICD-10-CM

## 2023-07-30 DIAGNOSIS — E876 Hypokalemia: Secondary | ICD-10-CM | POA: Diagnosis present

## 2023-07-30 DIAGNOSIS — B182 Chronic viral hepatitis C: Secondary | ICD-10-CM | POA: Diagnosis present

## 2023-07-30 DIAGNOSIS — F32A Depression, unspecified: Secondary | ICD-10-CM | POA: Diagnosis present

## 2023-07-30 DIAGNOSIS — F259 Schizoaffective disorder, unspecified: Secondary | ICD-10-CM | POA: Diagnosis present

## 2023-07-30 DIAGNOSIS — F1510 Other stimulant abuse, uncomplicated: Secondary | ICD-10-CM | POA: Diagnosis present

## 2023-07-30 LAB — CBC
HCT: 46.9 % (ref 39.0–52.0)
Hemoglobin: 15.7 g/dL (ref 13.0–17.0)
MCH: 32.5 pg (ref 26.0–34.0)
MCHC: 33.5 g/dL (ref 30.0–36.0)
MCV: 97.1 fL (ref 80.0–100.0)
Platelets: 180 10*3/uL (ref 150–400)
RBC: 4.83 MIL/uL (ref 4.22–5.81)
RDW: 12.8 % (ref 11.5–15.5)
WBC: 4.4 10*3/uL (ref 4.0–10.5)
nRBC: 0 % (ref 0.0–0.2)

## 2023-07-30 LAB — BASIC METABOLIC PANEL
Anion gap: 9 (ref 5–15)
BUN: 14 mg/dL (ref 6–20)
CO2: 23 mmol/L (ref 22–32)
Calcium: 8.9 mg/dL (ref 8.9–10.3)
Chloride: 107 mmol/L (ref 98–111)
Creatinine, Ser: 0.87 mg/dL (ref 0.61–1.24)
GFR, Estimated: >60 mL/min (ref >60)
Glucose, Bld: 91 mg/dL (ref 70–99)
Potassium: 3.6 mmol/L (ref 3.5–5.1)
Sodium: 139 mmol/L (ref 135–145)

## 2023-07-30 MED ORDER — LORAZEPAM 2 MG/ML IJ SOLN: 2.0000 mg | INTRAMUSCULAR | Status: DC | PRN

## 2023-07-30 NOTE — Progress Notes (Signed)
Report called to [facility] 5W, and [facility] for transport.

## 2023-07-30 NOTE — Progress Notes (Signed)
[facility]  PROGRESS NOTE  Justin [name] UJW:[id number] DOB: 15-Apr-1981 DOA: 07/28/2023 PCP: Pcp, No   Brief HPI:   a 43 y.o. male with medical history significant for homelessness, substance abuse, HIV, seizures, and schizoaffective disorder who presents after generalized seizure.  As per patient he has been having daily seizures.  Patient says that he was started on medication for schizophrenia, likely Zyprexa.  Patients with seizures have been happening since that time.  He was told by bystanders that he was confused and acting abnormally just before the seizures. Patient had a brief generalized seizure in the ED, was treated with Ativan, and loaded with 1500 mg of IV Keppra.  ED physician discussed the case with neurology who recommended observing the patient in the hospital given his repeated presentations, continuing his current dose of Keppra 1500 mg p.o. twice daily.  Follow Keppra levels.   Assessment/Plan:   Recurrent seizures -Patient having recurrent seizures despite being on Keppra, Keppra level was 20.9 on 07/26/2023. -Continue with Keppra 1500 mg p.o. twice daily -Might need long-term EEG monitoring -Called and discussed with neurology, recommended to obtain MRI brain with and without contrast -MRI brain with and without contrast is unremarkable -Plan to transfer to [facility] for LTM EEG   HIV -CD4 count 537, viral load less than 20 in October 2024 -Continue Biktarvy  Schizoaffective disorder -Continue Zyprexa, Seroquel  Hypokalemia -Replete   Medications     bictegravir-emtricitabine-tenofovir AF  1 tablet Oral Daily   enoxaparin (LOVENOX) injection  40 mg Subcutaneous Q24H   levETIRAcetam  1,500 mg Oral Q12H   OLANZapine  10 mg Oral QHS   QUEtiapine  100 mg Oral QHS   sodium chloride flush  3 mL Intravenous Q12H     Data Reviewed:   CBG:  Recent Labs  Lab 07/27/23 1248  GLUCAP 116*    SpO2: 96 %    Vitals:   07/29/23 1828  07/29/23 2112 07/30/23 0132 07/30/23 0617  BP: (!) 172/99 (!) 145/83 121/76 (!) 136/93  Pulse: 68 68 67 63  Resp: 17 20 20 20   Temp: 98 F (36.7 C) 97.8 F (36.6 C) (!) 97.5 F (36.4 C) (!) 97.4 F (36.3 C)  TempSrc: Oral Oral Oral Oral  SpO2: 97% 95% 95% 96%  Weight:      Height:          Data Reviewed:  Basic Metabolic Panel: Recent Labs  Lab 07/26/23 1709 07/27/23 1253 07/28/23 1833 07/29/23 0807 07/30/23 0655  NA 140 139 138 144 139  K 5.2* 3.7 3.4* 2.9* 3.6  CL 111 107 104 111 107  CO2 24 22 24 23 23   GLUCOSE 93 110* 99 79 91  BUN 8 9 7 10 14   CREATININE 1.03 0.99 0.88 0.99 0.87  CALCIUM 9.0 8.7* 8.9 7.5* 8.9  MG  --   --   --  1.8  --   PHOS  --   --   --  3.5  --     CBC: Recent Labs  Lab 07/26/23 1619 07/27/23 1253 07/28/23 1833 07/29/23 0807 07/30/23 0655  WBC 4.8 6.7 6.2 6.2 4.4  NEUTROABS  --  4.8 3.5  --   --   HGB 16.0 16.0 15.4 15.8 15.7  HCT 48.6 48.7 46.6 48.6 46.9  MCV 99.6 99.0 97.3 98.8 97.1  PLT 176 192 207 183 180    LFT Recent Labs  Lab 07/27/23 1253  AST 24  ALT 25  ALKPHOS  78  BILITOT 1.5*  PROT 7.4  ALBUMIN 3.9     Antibiotics: Anti-infectives (From admission, onward)    Start     Dose/Rate Route Frequency Ordered Stop   07/29/23 1000  bictegravir-emtricitabine-tenofovir AF (BIKTARVY) 50-200-25 MG per tablet 1 tablet        1 tablet Oral Daily 07/28/23 2209          DVT prophylaxis: Lovenox  Code Status: Full code  Family Communication: No family at bedside   CONSULTS    Subjective   Patient seen, no new complaints.  MRI brain with and without contrast was unremarkable.   Objective    Physical Examination:   General-appears in no acute distress Heart-S1-S2, regular, no murmur auscultated Lungs-clear to auscultation bilaterally, no wheezing or crackles auscultated Abdomen-soft, nontender, no organomegaly Extremities-no edema in the lower extremities Neuro-alert, oriented x3, no focal  deficit noted  Status is: Inpatient:             Meredeth Ide   [facility] If 7PM-7AM, please contact night-coverage at [email or web address], Office  [phone]   07/30/2023, 8:06 AM  LOS: 0 days

## 2023-07-30 NOTE — Plan of Care (Addendum)
Neurological consultation for this patient has been delayed due to delayed transfer to [facility]. Will be seen on arrival to [facility]. If still at [facility] mid-morning, will be seen by Neuro NP mid-morning  Based on history provided by primary team and documented in chart, will most likely need LTM EEG for spell characterization which is not available at [facility] -- therefore transfer to [facility] was requested.   I have notified primary team that there are no EEG techs overnight on Sunday; hopefully he will arrive earlier in the day. Does not need neuro checks / NIH, EEG can be hooked up anywhere except ED hallway beds.   Brooke Dare MD-PhD [facility] [phone] Available 7 AM to 7 PM, outside these hours please contact Neurologist on call listed on AMION

## 2023-07-30 NOTE — Consult Note (Signed)
NEUROLOGY CONSULT NOTE   Date of service: July 30, 2023 Patient Name: Justin [name] MRN:  [id number] DOB:  Jan 03, 1981 Chief Complaint: "Seizures" Requesting Provider: Meredeth Ide, MD  History of Present Illness  Justin [name] is a 43 y.o. male with hx of substance abuse including amphetamines, cannabis, well-controlled HIV, seizures on Keppra, SI with history of suicide attempts, schizoaffective disorder, MDD, substance-induced mood disorder, and multiple recent ED evaluations for complaints of seizure including September and October of 2024, and multiple seizures in January 2025 (07/15/23, 07/27/23, 07/28/23) with reports of one seizure daily for the past week.  Patient states that he was diagnosed with seizures approximately 3 years ago that he associates with his substance abuse and homelessness.  Patient endorses compliance with his daily Keppra and Biktarvy.  He states that over the past week, he has been told that he has episodes beginning with him talking to people that are not present in the room with him followed by patient "falling out with convulsions" and subsequent confusion.  Patient denies any recollection of these events and prolonged confusion to where he feels he has only been in the hospital for one day.  He denies recent substance abuse since the summer of 2024 though chart review supports patient has sought help for substance use as recently as November 2024.  His Keppra dose was increased from 1,000 mg BID to 1,500 mg BID in December 2024 for multiple presentations concerning for seizures.  While in the ED, staff witnessed the patient have a 45 second generalized onset seizure on 07/28/23 and he was transferred to [facility] for continuous EEG monitoring.   The patient does endorse some intermittent tongue soreness and bowel and bladder incontinence throughout the week but denies any aura prior to seizure events.     ROS  Comprehensive ROS performed and pertinent positives  documented in HPI   Past History   Past Medical History:  Diagnosis Date   Chronic hepatitis C without hepatic coma (HCC) 08/17/2017   Endocarditis    History of syphilis 08/25/2014   Treated by [facility] 07-2014.   HIV (human immunodeficiency virus infection) (HCC)    Seizures (HCC)    Past Surgical History:  Procedure Laterality Date   hand surgery Right    LUMBAR PUNCTURE  08/08/2019       WRIST SURGERY     Family History: Family History  Adopted: Yes  Problem Relation Age of Onset   Diabetes type II Mother    Social History  reports that he has been smoking e-cigarettes. He has never used smokeless tobacco. He reports current alcohol use. He reports current drug use. Drugs: Marijuana and Methamphetamines.  No Known Allergies  Medications   Current Facility-Administered Medications:    acetaminophen (TYLENOL) tablet 650 mg, 650 mg, Oral, Q6H PRN **OR** acetaminophen (TYLENOL) suppository 650 mg, 650 mg, Rectal, Q6H PRN, Opyd, Lavone Neri, MD   bictegravir-emtricitabine-tenofovir AF (BIKTARVY) 50-200-25 MG per tablet 1 tablet, 1 tablet, Oral, Daily, Opyd, Lavone Neri, MD, 1 tablet at 07/30/23 0925   enoxaparin (LOVENOX) injection 40 mg, 40 mg, Subcutaneous, Q24H, Opyd, Lavone Neri, MD, 40 mg at 07/29/23 2104   HYDROcodone-acetaminophen (NORCO/VICODIN) 5-325 MG per tablet 1-2 tablet, 1-2 tablet, Oral, Q4H PRN, Opyd, Lavone Neri, MD, 1 tablet at 07/29/23 0101   levETIRAcetam (KEPPRA) tablet 1,500 mg, 1,500 mg, Oral, Q12H, Sharl Ma, Gagan S, MD, 1,500 mg at 07/30/23 0925   OLANZapine (ZYPREXA) tablet 10 mg, 10 mg, Oral, QHS, Opyd, Timothy S,  MD, 10 mg at 07/29/23 2020   polyethylene glycol (MIRALAX / GLYCOLAX) packet 17 g, 17 g, Oral, Daily PRN, Opyd, Lavone Neri, MD   QUEtiapine (SEROQUEL) tablet 100 mg, 100 mg, Oral, QHS, Opyd, Timothy S, MD, 100 mg at 07/29/23 2020   sodium chloride flush (NS) 0.9 % injection 3 mL, 3 mL, Intravenous, Q12H, Opyd, Timothy S, MD, 3 mL at 07/30/23 0956  Vitals    Vitals:   07/29/23 2112 07/30/23 0132 07/30/23 0617 07/30/23 1037  BP: (!) 145/83 121/76 (!) 136/93 (!) 125/95  Pulse: 68 67 63 69  Resp: 20 20 20 18   Temp: 97.8 F (36.6 C) (!) 97.5 F (36.4 C) (!) 97.4 F (36.3 C) 97.7 F (36.5 C)  TempSrc: Oral Oral Oral Oral  SpO2: 95% 95% 96% 96%  Weight:      Height:        Body mass index is 32.29 kg/m.  Physical Exam   Constitutional: Pleasant African American male. Appears well-developed and well-nourished.  Psych: Affect appropriate to situation. Does not appear to be attending to internal stimuli.  Denies hallucinations.  Calm and cooperative with exam.  Eyes: No scleral injection.  HENT: No OP obstruction.  Head: Normocephalic.  Cardiovascular: Extremities warm, well perfused, without edema.  Respiratory: Effort normal, non-labored breathing on room air.   GI: Soft.  No distension. There is no tenderness.  Skin: WDI.   Neurologic Examination   Mental Status: Patient is awake, alert, oriented to person, place, month, year, and situation. Patient is able to state he is in the hospital due to seizures but denies recollection of events surrounding seizures and prolonged confusion after seizure events with loss of memory of the past few days since hospitalization.   No signs of aphasia or neglect. Cranial Nerves: II: Visual Fields are full. Pupils are equal, round, and reactive to light.   III,IV, VI: EOMI without ptosis or diploplia.  V: Facial sensation is intact and symmetric to light touch VII: Face is symmetric resting and with movement VIII: Hearing is intact to voice X: Palate elevates symmetrically XI: Shoulder shrug is symmetric. XII: Tongue protrudes midline  Motor: Tone is normal. Bulk is normal. 5/5 strength was present in all four extremities without asymmetry or unilateral weakness.  Sensory: Sensation is symmetric to light touch in the arms and legs. No extinction to DSS present.  Cerebellar: FNF and HKS are  intact bilaterally  Labs/Imaging/Neurodiagnostic studies   CBC:  Recent Labs  Lab 07/28/23 1253 07/28/23 1833 07/29/23 0807 07/30/23 0655  WBC 6.7 6.2 6.2 4.4  NEUTROABS 4.8 3.5  --   --   HGB 16.0 15.4 15.8 15.7  HCT 48.7 46.6 48.6 46.9  MCV 99.0 97.3 98.8 97.1  PLT 192 207 183 180   Basic Metabolic Panel:  Lab Results  Component Value Date   NA 139 07/30/2023   K 3.6 07/30/2023   CO2 23 07/30/2023   GLUCOSE 91 07/30/2023   BUN 14 07/30/2023   CREATININE 0.87 07/30/2023   CALCIUM 8.9 07/30/2023   GFRNONAA >60 07/30/2023   GFRAA 92 11/27/2020   Lipid Panel:  Lab Results  Component Value Date   LDLCALC 109 (H) 03/03/2023   HgbA1c:  Lab Results  Component Value Date   HGBA1C 4.7 (L) 04/18/2020   Urine Drug Screen:     Component Value Date/Time   LABOPIA NONE DETECTED 07/28/2023 2138   COCAINSCRNUR NONE DETECTED 07/28/2023 2138   LABBENZ NONE DETECTED 07/28/2023 2138  AMPHETMU NONE DETECTED 07/28/2023 2138   THCU NONE DETECTED 07/28/2023 2138   LABBARB NONE DETECTED 07/28/2023 2138    Alcohol Level     Component Value Date/Time   ETH <10 07/28/2023 2138   INR  Lab Results  Component Value Date   INR 1.1 10/05/2021   AED levels:  Lab Results  Component Value Date   LEVETIRACETA 20.9 07/26/2023   CT Head without contrast(Personally reviewed): No acute intracranial abnormality  MRI Brain with and without contrast (Personally reviewed): Normal MRI appearance of the brain. No acute or focal lesion to explain the patient's symptoms.   Neurodiagnostics cEEG ordered, pending  ASSESSMENT   Justin [name] is a 43 y.o. male  has a past medical history of Chronic hepatitis C without hepatic coma (HCC) (08/17/2017), Endocarditis, History of syphilis (08/25/2014), HIV (human immunodeficiency virus infection) (HCC), and Seizures (HCC). Patient presented to OSH 07/27/23 with complaints of daily seizures for one week despite compliance with his home Keppra  dosing.  On 07/28/23 patient had a witnessed event consisting of generalized shaking lasting approximately 45 seconds while in the ED.  Patient was given lorazepam at this time and recommended to transfer to [facility] for continuous EEG monitoring.  Will place patient on cEEG monitoring for further evaluation.  MRI brain with and without contrast and UDS are unremarkable, levetiracetam level therapeutic at 20.9 on 07/26/23 with no missed doses per patient.   RECOMMENDATIONS  - cEEG monitoring for spell capture; press button for any clinical events of concern - Seizure precautions - Continue Keppra 1500 mg BID at this time - Ativan 2 mg PRN seizure > 5 min, 2 doses ordered, notify neurology if used - Discussed seizure precautions with patient - Neurology will follow ______________________________________________________________________  Seizure precautions: Per [facility] statutes, patients with seizures are not allowed to drive until they have been seizure-free for six months and cleared by a physician    Use caution when using heavy equipment or power tools. Avoid working on ladders or at heights. Take showers instead of baths. Ensure the water temperature is not too high on the home water heater. Do not go swimming alone. Do not lock yourself in a room alone (i.e. bathroom). When caring for infants or small children, sit down when holding, feeding, or changing them to minimize risk of injury to the child in the event you have a seizure. Maintain good sleep hygiene. Avoid alcohol.    If patient has another seizure, call 911 and bring them back to the ED if: A.  The seizure lasts longer than 5 minutes.      B.  The patient doesn't wake shortly after the seizure or has new problems such as difficulty seeing, speaking or moving following the seizure C.  The patient was injured during the seizure D.  The patient has a temperature over 102 F (39C) E.  The patient vomited during the seizure and now is  having trouble breathing    During the Seizure   - First, ensure adequate ventilation and place patients on the floor on their left side  Loosen clothing around the neck and ensure the airway is patent. If the patient is clenching the teeth, do not force the mouth open with any object as this can cause severe damage - Remove all items from the surrounding that can be hazardous. The patient may be oblivious to what's happening and may not even know what he or she is doing. If the patient is  confused and wandering, either gently guide him/her away and block access to outside areas - Reassure the individual and be comforting - Call 911. In most cases, the seizure ends before EMS arrives. However, there are cases when seizures may last over 3 to 5 minutes. Or the individual may have developed breathing difficulties or severe injuries. If a pregnant patient or a person with diabetes develops a seizure, it is prudent to call an ambulance. - Finally, if the patient does not regain full consciousness, then call EMS. Most patients will remain confused for about 45 to 90 minutes after a seizure, so you must use judgment in calling for help. - Avoid restraints but make sure the patient is in a bed with padded side rails - Place the individual in a lateral position with the neck slightly flexed; this will help the saliva drain from the mouth and prevent the tongue from falling backward - Remove all nearby furniture and other hazards from the area - Provide verbal assurance as the individual is regaining consciousness - Provide the patient with privacy if possible - Call for help and start treatment as ordered by the caregiver    After the Seizure (Postictal Stage)   After a seizure, most patients experience confusion, fatigue, muscle pain and/or a headache. Thus, one should permit the individual to sleep. For the next few days, reassurance is essential. Being calm and helping reorient the person is also of  importance.   Most seizures are painless and end spontaneously. Seizures are not harmful to others but can lead to complications such as stress on the lungs, brain and the heart. Individuals with prior lung problems may develop labored breathing and respiratory distress.    Signed, Kara Mead, NP [facility]  Attending Neurologist's note:  I personally saw this patient, gathering history, performing a full neurologic examination, reviewing relevant labs, personally reviewing relevant imaging including MRI brain, and formulated the assessment and plan, adding the note above for completeness and clarity to accurately reflect my thoughts  Brooke Dare MD-PhD [facility] [phone] Available 7 AM to 7 PM, outside these hours please contact Neurologist on call listed on AMION

## 2023-07-30 NOTE — Progress Notes (Signed)
LTM EEG hooked up and running - no initial skin breakdown - push button tested - [facility] monitoring. SH/GMD

## 2023-07-30 NOTE — Plan of Care (Signed)
  Problem: Education: Goal: Knowledge of General Education information will improve Description: Including pain rating scale, medication(s)/side effects and non-pharmacologic comfort measures Outcome: Progressing   Problem: Coping: Goal: Level of anxiety will decrease Outcome: Progressing   Problem: Pain Managment: Goal: General experience of comfort will improve and/or be controlled Outcome: Progressing

## 2023-07-31 DIAGNOSIS — B20 Human immunodeficiency virus [HIV] disease: Secondary | ICD-10-CM | POA: Diagnosis not present

## 2023-07-31 DIAGNOSIS — R569 Unspecified convulsions: Secondary | ICD-10-CM | POA: Diagnosis not present

## 2023-07-31 DIAGNOSIS — F259 Schizoaffective disorder, unspecified: Secondary | ICD-10-CM | POA: Diagnosis not present

## 2023-07-31 LAB — BASIC METABOLIC PANEL
Anion gap: 7 (ref 5–15)
BUN: 16 mg/dL (ref 6–20)
CO2: 23 mmol/L (ref 22–32)
Calcium: 8.8 mg/dL — ABNORMAL LOW (ref 8.9–10.3)
Chloride: 106 mmol/L (ref 98–111)
Creatinine, Ser: 0.99 mg/dL (ref 0.61–1.24)
GFR, Estimated: >60 mL/min (ref >60)
Glucose, Bld: 103 mg/dL — ABNORMAL HIGH (ref 70–99)
Potassium: 3.6 mmol/L (ref 3.5–5.1)
Sodium: 136 mmol/L (ref 135–145)

## 2023-07-31 LAB — LEVETIRACETAM LEVEL: Levetiracetam Lvl: 70 ug/mL — ABNORMAL HIGH (ref 10.0–40.0)

## 2023-07-31 LAB — CBC
HCT: 45.4 % (ref 39.0–52.0)
Hemoglobin: 15.6 g/dL (ref 13.0–17.0)
MCH: 32.8 pg (ref 26.0–34.0)
MCHC: 34.4 g/dL (ref 30.0–36.0)
MCV: 95.6 fL (ref 80.0–100.0)
Platelets: 204 10*3/uL (ref 150–400)
RBC: 4.75 MIL/uL (ref 4.22–5.81)
RDW: 12.7 % (ref 11.5–15.5)
WBC: 4.7 10*3/uL (ref 4.0–10.5)
nRBC: 0 % (ref 0.0–0.2)

## 2023-07-31 NOTE — Progress Notes (Signed)
LTM maint complete - no skin breakdown under: Fp1 Fp2  Serviced P7 [facility] monitored, Event button test confirmed by [facility].

## 2023-07-31 NOTE — Progress Notes (Signed)
LTM maint complete - no skin breakdown under:  FP1,FP2

## 2023-07-31 NOTE — Progress Notes (Signed)
NEUROLOGY CONSULT FOLLOW UP NOTE   Date of service: July 31, 2023 Patient Name: Justin [name] MRN:  [id number] DOB:  09-06-1980  Interval Hx/subjective  Patient with history of substance abuse who states he has abstained for the past several months, well-controlled HIV, seizures on Keppra, schizoaffective disorder and depression presented with multiple episodes where he begins to talk to people who were not present in the room and then has seizure-like activity and subsequent confusion.  He has no recollection of these episodes and states that he has memory of his time in the hospital has been foggy.  He reports that he has had no episodes today that he knows of but states that he is unaware of these episodes when they happen.  RN states he has not had any episodes as well.  LTM EEG shows right frontotemporal sharp waves. Vitals   Vitals:   07/31/23 0400 07/31/23 0829 07/31/23 1155 07/31/23 1726  BP: 130/78 (!) 142/78  (!) 143/92  Pulse: 77 64  73  Resp:  18 15 18   Temp: 97.8 F (36.6 C) (!) 97.4 F (36.3 C) (!) 97.2 F (36.2 C) 98.7 F (37.1 C)  TempSrc: Oral Oral Oral Oral  SpO2: 93% 99%    Weight:      Height:         Body mass index is 32.29 kg/m.  Physical Exam   Constitutional: Appears well-developed and well-nourished.  Psych: Affect appropriate to situation.  Eyes: No scleral injection.  HENT: No OP obstrucion.  Head: Normocephalic.  Respiratory: Effort normal, non-labored breathing.  Skin: WDI.   Neurologic Examination    NEURO:  Mental Status: Alert and oriented to person place time and situation, able to give clear and coherent history of present illness Speech/Language: speech is without dysarthria or aphasia.   Cranial Nerves:  II: PERRL.  III, IV, VI: EOMI. Eyelids elevate symmetrically.  V: Sensation is intact to light touch and symmetrical to face.  VII: Smile is symmetrical.  VIII: hearing intact to voice. IX, X: Phonation is normal.   IH:[id number] shrug 5/5. XII: tongue is midline without fasciculations. Motor: 5/5 strength to all muscle groups tested.  Tone: is normal and bulk is normal Sensation- Intact to light touch bilaterally.  Coordination: FTN intact bilaterally, HKS: no ataxia in BLE. Gait- deferred  Medications  Current Facility-Administered Medications:    acetaminophen (TYLENOL) tablet 650 mg, 650 mg, Oral, Q6H PRN **OR** acetaminophen (TYLENOL) suppository 650 mg, 650 mg, Rectal, Q6H PRN, Opyd, Lavone Neri, MD   bictegravir-emtricitabine-tenofovir AF (BIKTARVY) 50-200-25 MG per tablet 1 tablet, 1 tablet, Oral, Daily, Opyd, Lavone Neri, MD, 1 tablet at 07/31/23 1006   enoxaparin (LOVENOX) injection 40 mg, 40 mg, Subcutaneous, Q24H, Opyd, Lavone Neri, MD, 40 mg at 07/30/23 2127   HYDROcodone-acetaminophen (NORCO/VICODIN) 5-325 MG per tablet 1-2 tablet, 1-2 tablet, Oral, Q4H PRN, Opyd, Lavone Neri, MD, 2 tablet at 07/31/23 1006   levETIRAcetam (KEPPRA) tablet 1,500 mg, 1,500 mg, Oral, Q12H, Sharl Ma, Gagan S, MD, 1,500 mg at 07/31/23 1006   LORazepam (ATIVAN) injection 2 mg, 2 mg, Intravenous, Q4H PRN, Bhagat, Srishti L, MD   OLANZapine (ZYPREXA) tablet 10 mg, 10 mg, Oral, QHS, Opyd, Timothy S, MD, 10 mg at 07/30/23 2123   polyethylene glycol (MIRALAX / GLYCOLAX) packet 17 g, 17 g, Oral, Daily PRN, Opyd, Lavone Neri, MD   QUEtiapine (SEROQUEL) tablet 100 mg, 100 mg, Oral, QHS, Opyd, Timothy S, MD, 100 mg at 07/30/23 2123   sodium chloride flush (  NS) 0.9 % injection 3 mL, 3 mL, Intravenous, Q12H, Opyd, Lavone Neri, MD, 3 mL at 07/31/23 1007  Labs and Diagnostic Imaging   CBC:  Recent Labs  Lab 07/27/23 1253 07/28/23 1833 07/29/23 0807 07/30/23 0655 07/31/23 0500  WBC 6.7 6.2   < > 4.4 4.7  NEUTROABS 4.8 3.5  --   --   --   HGB 16.0 15.4   < > 15.7 15.6  HCT 48.7 46.6   < > 46.9 45.4  MCV 99.0 97.3   < > 97.1 95.6  PLT 192 207   < > 180 204   < > = values in this interval not displayed.    Basic Metabolic Panel:   Lab Results  Component Value Date   NA 136 07/31/2023   K 3.6 07/31/2023   CO2 23 07/31/2023   GLUCOSE 103 (H) 07/31/2023   BUN 16 07/31/2023   CREATININE 0.99 07/31/2023   CALCIUM 8.8 (L) 07/31/2023   GFRNONAA >60 07/31/2023   GFRAA 92 11/27/2020   Lipid Panel:  Lab Results  Component Value Date   LDLCALC 109 (H) 03/03/2023   HgbA1c:  Lab Results  Component Value Date   HGBA1C 4.7 (L) 04/18/2020   Urine Drug Screen:     Component Value Date/Time   LABOPIA NONE DETECTED 07/28/2023 2138   COCAINSCRNUR NONE DETECTED 07/28/2023 2138   LABBENZ NONE DETECTED 07/28/2023 2138   AMPHETMU NONE DETECTED 07/28/2023 2138   THCU NONE DETECTED 07/28/2023 2138   LABBARB NONE DETECTED 07/28/2023 2138    Alcohol Level     Component Value Date/Time   ETH <10 07/28/2023 2138   INR  Lab Results  Component Value Date   INR 1.1 10/05/2021   APTT No results found for: "APTT" AED levels:  Lab Results  Component Value Date   LEVETIRACETA 70.0 (H) 07/28/2023    CT Head without contrast(Personally reviewed): No acute abnormality  MRI Brain(Personally reviewed): No acute abnormality  LTM EEG 1/27:  Sharp waves, right frontotemporal region, intermittent slow right frontotemporal region, evidence of epileptogenicity and cortical dysfunction arising from right frontotemporal region but no seizures seen  Assessment   ORVILE CORONA is a 43 y.o. male with history of substance abuse who states that he has abstained from substance abuse for several months, HIV, seizures on Keppra, schizoaffective disorder and depression presents with episodes where he first begins talking to people who are not in the room, then having seizure-like activity and subsequently becoming confused.  He states he is unaware when these episodes happen but that he does have some confusion and fogginess afterwards.  He states he is memory of his time in the hospital has been unclear.  He is aware that substance  abuse does increase his risk for seizures and states that he has not used amphetamines in about 6 months.  He states that he has not had any episodes today that he has been aware of, and his RN states she has not observed any episodes either.  Given sharp waves and epileptogenicity on EEG, will keep patient on LTM EEG overnight and continue current dose of Keppra.  He is aware of seizure precautions and driving restriction.  Recommendations  -Continue long-term EEG - Continue seizure precautions - Continue Keppra 1500 mg twice daily - Continue Ativan 2 mg IV as needed for seizures, notify neurology if used - Neurology will follow along ______________________________________________________________________   Signed, Farhana Fellows Harland Dingwall, NP [facility]

## 2023-07-31 NOTE — Procedures (Signed)
Patient Name: Justin [name]  MRN: [id number]  Epilepsy Attending: Charlsie Quest  Referring Physician/Provider: Gordy Councilman, MD  Duration: 07/30/2023 1117 to 07/31/2023 1117  Patient history: 42yo M with recurrent seizures. EEG to evaluate for seizure  Level of alertness: Awake, asleep  AEDs during EEG study: LEV  Technical aspects: This EEG study was done with scalp electrodes positioned according to the 10-20 International system of electrode placement. Electrical activity was reviewed with band pass filter of 1-70Hz , sensitivity of 7 uV/mm, display speed of 80mm/sec with a 60Hz  notched filter applied as appropriate. EEG data were recorded continuously and digitally stored.  Video monitoring was available and reviewed as appropriate.  Description: The posterior dominant rhythm consists of 8-9 Hz activity of moderate voltage (25-35 uV) seen predominantly in posterior head regions, symmetric and reactive to eye opening and eye closing. Sleep was characterized by vertex waves, sleep spindles (12 to 14 Hz), maximal frontocentral region. EEG showed intermittent 3 to 5 Hz theta-delta slowing in right fronto-temporal region. Sharp waves were noted in right fronto-temporal region. Hyperventilation and photic stimulation were not performed.     ABNORMALITY -Sharp wave, right fronto-temporal region - Intermittent slow, right fronto-temporal region  IMPRESSION: This study showed evidence of epileptogenicity and cortical dysfunction arising from right fronto-temporal region. No seizures were seen throughout the recording.  Justin [name]

## 2023-07-31 NOTE — Progress Notes (Signed)
[facility]  PROGRESS NOTE  Justin [name] ZDG:[id number] DOB: [birth year] DOA: 07/28/2023 PCP: Pcp, No   Brief HPI:   a 43 y.o. male with medical history significant for homelessness, substance abuse, HIV, seizures, and schizoaffective disorder who presents after generalized seizure.  As per patient he has been having daily seizures.  Patient says that he was started on medication for schizophrenia, likely Zyprexa.  Patients with seizures have been happening since that time.  He was told by bystanders that he was confused and acting abnormally just before the seizures. Patient had a brief generalized seizure in the ED, was treated with Ativan, and loaded with 1500 mg of IV Keppra.  ED physician discussed the case with neurology who recommended observing the patient in the hospital given his repeated presentations, continuing his current dose of Keppra 1500 mg p.o. twice daily.  Follow Keppra levels.   Assessment/Plan:   Recurrent seizures -Patient having recurrent seizures despite being on Keppra, Keppra level was 20.9 on 07/26/2023. -Continue with Keppra 1500 mg p.o. twice daily -Will keep him but greatly appreciated, currently on LTM EEG, will await further recommendations -MRI brain with and without contrast is unremarkable -Continue with current dose Keppra, and as needed Ativan pending further neurology recommendations.  Morning reading showing epileptogenicity and cortical dysfunction arising from right fronto-temporal region. No seizures were seen throughout the recording    HIV -CD4 count 537, viral load less than 20 in October 2024 -Continue Biktarvy  Schizoaffective disorder -Continue Zyprexa, Seroquel  Hypokalemia -Replete   Medications     bictegravir-emtricitabine-tenofovir AF  1 tablet Oral Daily   enoxaparin (LOVENOX) injection  40 mg Subcutaneous Q24H   levETIRAcetam  1,500 mg Oral Q12H   OLANZapine  10 mg Oral QHS   QUEtiapine  100 mg Oral QHS   sodium  chloride flush  3 mL Intravenous Q12H     Data Reviewed:   CBG:  Recent Labs  Lab 07/27/23 1248  GLUCAP 116*    SpO2: 99 %    Vitals:   07/30/23 2331 07/31/23 0400 07/31/23 0829 07/31/23 1155  BP: (!) 112/90 130/78 (!) 142/78   Pulse: 69 77 64   Resp:   18 15  Temp: 97.8 F (36.6 C) 97.8 F (36.6 C) (!) 97.4 F (36.3 C) (!) 97.2 F (36.2 C)  TempSrc: Oral Oral Oral Oral  SpO2: 95% 93% 99%   Weight:      Height:          Data Reviewed:  Basic Metabolic Panel: Recent Labs  Lab 07/27/23 1253 07/28/23 1833 07/29/23 0807 07/30/23 0655 07/31/23 0500  NA 139 138 144 139 136  K 3.7 3.4* 2.9* 3.6 3.6  CL 107 104 111 107 106  CO2 22 24 23 23 23   GLUCOSE 110* 99 79 91 103*  BUN 9 7 10 14 16   CREATININE 0.99 0.88 0.99 0.87 0.99  CALCIUM 8.7* 8.9 7.5* 8.9 8.8*  MG  --   --  1.8  --   --   PHOS  --   --  3.5  --   --     CBC: Recent Labs  Lab 07/27/23 1253 07/28/23 1833 07/29/23 0807 07/30/23 0655 07/31/23 0500  WBC 6.7 6.2 6.2 4.4 4.7  NEUTROABS 4.8 3.5  --   --   --   HGB 16.0 15.4 15.8 15.7 15.6  HCT 48.7 46.6 48.6 46.9 45.4  MCV 99.0 97.3 98.8 97.1 95.6  PLT 192 207 183 180 204  LFT Recent Labs  Lab 07/27/23 1253  AST 24  ALT 25  ALKPHOS 78  BILITOT 1.5*  PROT 7.4  ALBUMIN 3.9     Antibiotics: Anti-infectives (From admission, onward)    Start     Dose/Rate Route Frequency Ordered Stop   07/29/23 1000  bictegravir-emtricitabine-tenofovir AF (BIKTARVY) 50-200-25 MG per tablet 1 tablet        1 tablet Oral Daily 07/28/23 2209          DVT prophylaxis: Lovenox  Code Status: Full code  Family Communication: No family at bedside   CONSULTS    Subjective   Denies any complaints   Objective    Physical Examination:   Awake Alert, Oriented X 3, No new F.N deficits, Normal affect, connected to LTM EEG Symmetrical Chest wall movement, Good air movement bilaterally, CTAB RRR,No Gallops,Rubs or new Murmurs, No  Parasternal Heave +ve B.Sounds, Abd Soft, No tenderness, No rebound - guarding or rigidity. No Cyanosis, Clubbing or edema, No new Rash or bruise     Status is: Inpatient:             Tacie Mccuistion   [facility] If 7PM-7AM, please contact night-coverage at [email or web address], Office  [phone]   07/31/2023, 4:12 PM  LOS: 1 day

## 2023-07-31 NOTE — Progress Notes (Addendum)
   07/31/23 1134  Spiritual Encounters  Type of Visit Initial  Care provided to: Patient  Referral source Nurse (RN/NT/LPN)  Reason for visit Advance directives  OnCall Visit No   Responded to request for HCPOA. Provided education, questions and answers. Patient also felling stressed as he does not know what is happening to him or why he is having seizures. He is not aware that the seizures are occurring. He also has had memory issues and told by his friend that he becomes combative and argue with people that aren't there when these episodes occur. Patient does not remember coming to the hospital as well as the several visits he had preceding the current stay. Patient has no immediate family and has moved to [facility] from [place]. Patient is concerned as he had a seizure at work. Patient is currently homeless and lives out of his friend's [name].  Left forms with patient.

## 2023-07-31 NOTE — Plan of Care (Signed)
Problem: Education: Goal: Knowledge of General Education information will improve Description: Including pain rating scale, medication(s)/side effects and non-pharmacologic comfort measures Outcome: Progressing   Problem: Elimination: Goal: Will not experience complications related to bowel motility Outcome: Progressing   Problem: Pain Managment: Goal: General experience of comfort will improve and/or be controlled Outcome: Progressing   Problem: Safety: Goal: Ability to remain free from injury will improve Outcome: Progressing

## 2023-08-01 ENCOUNTER — Other Ambulatory Visit (HOSPITAL_COMMUNITY): Payer: Self-pay

## 2023-08-01 DIAGNOSIS — G40909 Epilepsy, unspecified, not intractable, without status epilepticus: Secondary | ICD-10-CM | POA: Diagnosis not present

## 2023-08-01 DIAGNOSIS — R569 Unspecified convulsions: Secondary | ICD-10-CM | POA: Diagnosis not present

## 2023-08-01 MED ORDER — LEVETIRACETAM 1000 MG PO TABS
2000.0000 mg | ORAL_TABLET | Freq: Two times a day (BID) | ORAL | 0 refills | Status: AC
  Filled 2023-08-01: qty 120, 30d supply, fill #0

## 2023-08-01 MED ORDER — LEVETIRACETAM 500 MG PO TABS: 2000.0000 mg | ORAL_TABLET | Freq: Two times a day (BID) | ORAL | Status: DC

## 2023-08-01 NOTE — Progress Notes (Signed)
LTM EEG discontinued - no skin breakdown at [facility].

## 2023-08-01 NOTE — Procedures (Addendum)
Patient Name: MACARIUS RUARK  MRN: [id number]  Epilepsy Attending: Charlsie Quest  Referring Physician/Provider: Gordy Councilman, MD  Duration: 07/31/2023 1117 to 08/01/2023 1018   Patient history: 42yo M with recurrent seizures. EEG to evaluate for seizure   Level of alertness: Awake, asleep   AEDs during EEG study: LEV   Technical aspects: This EEG study was done with scalp electrodes positioned according to the 10-20 International system of electrode placement. Electrical activity was reviewed with band pass filter of 1-70Hz , sensitivity of 7 uV/mm, display speed of 65mm/sec with a 60Hz  notched filter applied as appropriate. EEG data were recorded continuously and digitally stored.  Video monitoring was available and reviewed as appropriate.   Description: The posterior dominant rhythm consists of 8-9 Hz activity of moderate voltage (25-35 uV) seen predominantly in posterior head regions, symmetric and reactive to eye opening and eye closing. Sleep was characterized by vertex waves, sleep spindles (12 to 14 Hz), maximal frontocentral region. EEG showed intermittent 3 to 5 Hz theta-delta slowing in right fronto-temporal region. Sharp waves were noted in right fronto-temporal region. Hyperventilation and photic stimulation were not performed.      ABNORMALITY - Sharp wave, right fronto-temporal region - Intermittent slow, right fronto-temporal region   IMPRESSION: This study showed evidence of epileptogenicity and cortical dysfunction arising from right fronto-temporal region. No seizures were seen throughout the recording.   Takeela Peil Annabelle Harman

## 2023-08-01 NOTE — Discharge Instructions (Signed)
Seizure precautions  Do not drive, operating heavy machinery, perform activities at heights, swimming or participation in water activities or provide baby sitting services for 67-month

## 2023-08-01 NOTE — Progress Notes (Signed)
Subjective: No acute events overnight.  Denies any further seizure-like activity.  ROS: negative except above  Examination  Vital signs in last 24 hours: Temp:  [97.5 F (36.4 C)-98.7 F (37.1 C)] 97.5 F (36.4 C) (01/28 0800) Pulse Rate:  [69-76] 69 (01/28 0342) Resp:  [18] 18 (01/28 0800) BP: (132-146)/(50-93) 142/50 (01/28 0800) SpO2:  [90 %-94 %] 94 % (01/28 0800)  General: lying in bed, NAD  Neuro: MS: Alert, oriented, follows commands CN: pupils equal and reactive,  EOMI, face symmetric, tongue midline, normal sensation over face, Motor: 5/5 strength in all 4 extremities Coordination: normal Gait: not tested  Basic Metabolic Panel: Recent Labs  Lab 07/27/23 1253 07/28/23 1833 07/29/23 0807 07/30/23 0655 07/31/23 0500  NA 139 138 144 139 136  K 3.7 3.4* 2.9* 3.6 3.6  CL 107 104 111 107 106  CO2 22 24 23 23 23   GLUCOSE 110* 99 79 91 103*  BUN 9 7 10 14 16   CREATININE 0.99 0.88 0.99 0.87 0.99  CALCIUM 8.7* 8.9 7.5* 8.9 8.8*  MG  --   --  1.8  --   --   PHOS  --   --  3.5  --   --     CBC: Recent Labs  Lab 07/27/23 1253 07/28/23 1833 07/29/23 0807 07/30/23 0655 07/31/23 0500  WBC 6.7 6.2 6.2 4.4 4.7  NEUTROABS 4.8 3.5  --   --   --   HGB 16.0 15.4 15.8 15.7 15.6  HCT 48.7 46.6 48.6 46.9 45.4  MCV 99.0 97.3 98.8 97.1 95.6  PLT 192 207 183 180 204     Coagulation Studies: No results for input(s): "LABPROT", "INR" in the last 72 hours.  Imaging personally reviewed:  MRI brain with and without contrast 07/29/2023:Normal MRI appearance of the brain. No acute or focal lesion to explain the patient's symptoms.    ASSESSMENT AND PLAN: 43 year old male with history of epilepsy who presented with breakthrough seizures with no clear provoking factors.  Epilepsy with breakthrough seizure -No seizures overnight -Increase Keppra to 2000 mg twice daily as we did not find any clear provoking factors -Patient does not have any family members or friends to  administer this medication unfortunately and he is typically not aware when the seizures are happening. -Discussed seizure precautions including no driving -Follow-up with neurology in 2 to 3 months (order placed) -Discussed plan with Dr. Randol Kern via secure chat  Seizure precautions: Per [facility] statutes, patients with seizures are not allowed to drive until they have been seizure-free for six months and cleared by a physician    Use caution when using heavy equipment or power tools. Avoid working on ladders or at heights. Take showers instead of baths. Ensure the water temperature is not too high on the home water heater. Do not go swimming alone. Do not lock yourself in a room alone (i.e. bathroom). When caring for infants or small children, sit down when holding, feeding, or changing them to minimize risk of injury to the child in the event you have a seizure. Maintain good sleep hygiene. Avoid alcohol.    If patient has another seizure, call 911 and bring them back to the ED if: A.  The seizure lasts longer than 5 minutes.      B.  The patient doesn't wake shortly after the seizure or has new problems such as difficulty seeing, speaking or moving following the seizure C.  The patient was injured during the seizure D.  The patient has a temperature over 102 F (39C) E.  The patient vomited during the seizure and now is having trouble breathing    During the Seizure   - First, ensure adequate ventilation and place patients on the floor on their left side  Loosen clothing around the neck and ensure the airway is patent. If the patient is clenching the teeth, do not force the mouth open with any object as this can cause severe damage - Remove all items from the surrounding that can be hazardous. The patient may be oblivious to what's happening and may not even know what he or she is doing. If the patient is confused and wandering, either gently guide him/her away and block access to  outside areas - Reassure the individual and be comforting - Call 911. In most cases, the seizure ends before EMS arrives. However, there are cases when seizures may last over 3 to 5 minutes. Or the individual may have developed breathing difficulties or severe injuries. If a pregnant patient or a person with diabetes develops a seizure, it is prudent to call an ambulance.     After the Seizure (Postictal Stage)   After a seizure, most patients experience confusion, fatigue, muscle pain and/or a headache. Thus, one should permit the individual to sleep. For the next few days, reassurance is essential. Being calm and helping reorient the person is also of importance.   Most seizures are painless and end spontaneously. Seizures are not harmful to others but can lead to complications such as stress on the lungs, brain and the heart. Individuals with prior lung problems may develop labored breathing and respiratory distress.     I have spent a total of  36 minutes with the patient reviewing hospital notes,  test results, labs and examining the patient as well as establishing an assessment and plan that was discussed personally with the patient.  > 50% of time was spent in direct patient care.        Lindie Spruce Epilepsy [facility] For questions after 5pm please refer to AMION to reach the Neurologist on call

## 2023-08-01 NOTE — TOC CM/SW Note (Signed)
Transition of Care [facility]) - Inpatient Brief Assessment   Patient Details  Name: Justin [name] MRN: [id number] Date of Birth: [birth year]  [facility]) CM/SW Contact:    Mearl Latin, LCSW Phone Number: 08/01/2023, 1:14 PM   Clinical Narrative: Patient admitted with seizures and history of homeless. Patient left prior to CSW seeing patient but he was seen in December and provided with community resources. No needs identified.    Transition of Care Asessment: Insurance and Status: Insurance coverage has been reviewed Patient has primary care physician: No Home environment has been reviewed: homeless Prior level of function:: Independent Prior/Current Home Services: No current home services Social Drivers of Health Review: SDOH reviewed interventions complete Readmission risk has been reviewed: Yes Transition of care needs: no transition of care needs at this time

## 2023-08-01 NOTE — Discharge Summary (Signed)
Physician Discharge Summary  Justin [name] ZOX:[id number] DOB: [birth year] DOA: 07/28/2023  PCP: Oneita Hurt, No  Admit date: 07/28/2023 Discharge date: 08/01/2023  Admitted From: Homeless Disposition: The same  Recommendations for Outpatient Follow-up:  Follow up with PCP in 1-2 weeks  Diet recommendation: Regular   Brief/Interim Summary:   43 y.o. male with medical history significant for homelessness, substance abuse, HIV, seizures, and schizoaffective disorder who presents after generalized seizure.  As per patient he has been having daily seizures.  Patient says that he was started on medication for schizophrenia, likely Zyprexa.  Patients with seizures have been happening since that time.  He was told by bystanders that he was confused and acting abnormally just before the seizures. Patient had a brief generalized seizure in the ED, was treated with Ativan, and loaded with 1500 mg of IV Keppra.  ED physician discussed the case with neurology who recommended the patient for further management   Recurrent seizures -Patient having recurrent seizures despite being on Keppra, Keppra level was 20.9 on 07/26/2023. -MRI brain with and without contrast is unremarkable -Neurology input greatly appreciated, he was kept on LTM EEG, his medication has been adjusted, Keppra was increased to 1000 mg oral twice daily, as there was no clear provoking factors found.  Have discussed seizure precautions with him, with no driving instructions, patient to follow-up with neurology in 2 to 30-month, orders was placed by neurology, he was given 1 month supply of new Keppra dose.       HIV -CD4 count 537, viral load less than 20 in October 2024 -Continue Biktarvy   Schizoaffective disorder -Continue Zyprexa, Seroquel   Hypokalemia -Repleted    Discharge Diagnoses:  Principal Problem:   Seizures (HCC) Active Problems:   Human immunodeficiency virus (HIV) disease (HCC)   Substance use disorder    Schizoaffective disorder [facility])    Discharge Instructions  Discharge Instructions     Ambulatory referral to Neurology   Complete by: As directed    An appointment is requested in approximately: 8 weeks   Diet - low sodium heart healthy   Complete by: As directed    Discharge instructions   Complete by: As directed    Seizure precautions  Do not drive, operating heavy machinery, perform activities at heights, swimming or participation in water activities or provide baby sitting services for 32-month   Increase activity slowly   Complete by: As directed       Allergies as of 08/01/2023   No Known Allergies      Medication List     TAKE these medications    bictegravir-emtricitabine-tenofovir AF 50-200-25 MG Tabs tablet Commonly known as: BIKTARVY Take 1 tablet by mouth daily.   levETIRAcetam 1000 MG tablet Commonly known as: KEPPRA Take 2 tablets (2,000 mg total) by mouth every 12 (twelve) hours. What changed:  medication strength how much to take when to take this   OLANZapine 10 MG tablet Commonly known as: ZYPREXA Take 1 tablet (10 mg total) by mouth at bedtime.   QUEtiapine 100 MG tablet Commonly known as: SEROQUEL Take 1 tablet (100 mg total) by mouth at bedtime.        No Known Allergies  Consultations: Neurology   Procedures/Studies: Overnight EEG with video Result Date: 07/31/2023 Charlsie Quest, MD     08/01/2023  9:58 AM Patient Name: Justin [name] MRN: [id number] Epilepsy Attending: Charlsie Quest Referring Physician/Provider: Gordy Councilman, MD Duration: 07/30/2023 1117 to 07/31/2023 1117 Patient history: 42yo  M with recurrent seizures. EEG to evaluate for seizure Level of alertness: Awake, asleep AEDs during EEG study: LEV Technical aspects: This EEG study was done with scalp electrodes positioned according to the 10-20 International system of electrode placement. Electrical activity was reviewed with band pass filter of 1-70Hz ,  sensitivity of 7 uV/mm, display speed of 49mm/sec with a 60Hz  notched filter applied as appropriate. EEG data were recorded continuously and digitally stored.  Video monitoring was available and reviewed as appropriate. Description: The posterior dominant rhythm consists of 8-9 Hz activity of moderate voltage (25-35 uV) seen predominantly in posterior head regions, symmetric and reactive to eye opening and eye closing. Sleep was characterized by vertex waves, sleep spindles (12 to 14 Hz), maximal frontocentral region. EEG showed intermittent 3 to 5 Hz theta-delta slowing in right fronto-temporal region. Sharp waves were noted in right fronto-temporal region. Hyperventilation and photic stimulation were not performed.   ABNORMALITY -Sharp wave, right fronto-temporal region - Intermittent slow, right fronto-temporal region IMPRESSION: This study showed evidence of epileptogenicity and cortical dysfunction arising from right fronto-temporal region. No seizures were seen throughout the recording. Charlsie Quest   MR BRAIN W WO CONTRAST Result Date: 07/29/2023 CLINICAL DATA:  Seizure, new onset.  Memory loss over the last week. EXAM: MRI HEAD WITHOUT AND WITH CONTRAST TECHNIQUE: Multiplanar, multiecho pulse sequences of the brain and surrounding structures were obtained without and with intravenous contrast. CONTRAST:  10mL GADAVIST GADOBUTROL 1 MMOL/ML IV SOLN COMPARISON:  CT head without contrast 07/28/2023. MR head without and with contrast 07/31/2021. FINDINGS: Brain: No acute infarct, hemorrhage, or mass lesion is present. No significant white matter lesions are present. Deep brain nuclei are within normal limits. The ventricles are of normal size. No significant extraaxial fluid collection is present. The brainstem and cerebellum are within normal limits. The internal auditory canals are within normal limits. Midline structures are within normal limits. Vascular: Flow is present in the major intracranial  arteries. Skull and upper cervical spine: The craniocervical junction is normal. Upper cervical spine is within normal limits. Marrow signal is unremarkable. Sinuses/Orbits: The paranasal sinuses and mastoid air cells are clear. The globes and orbits are within normal limits. IMPRESSION: Normal MRI appearance of the brain. No acute or focal lesion to explain the patient's symptoms. Electronically Signed   By: Marin Roberts M.D.   On: 07/29/2023 18:36   CT Head Wo Contrast Result Date: 07/28/2023 CLINICAL DATA:  Head trauma, abnormal mental status (Age 57-64y) EXAM: CT HEAD WITHOUT CONTRAST TECHNIQUE: Contiguous axial images were obtained from the base of the skull through the vertex without intravenous contrast. RADIATION DOSE REDUCTION: This exam was performed according to the departmental dose-optimization program which includes automated exposure control, adjustment of the mA and/or kV according to patient size and/or use of iterative reconstruction technique. COMPARISON:  Head CT 07/15/2023 FINDINGS: Brain: No intracranial hemorrhage, mass effect, or midline shift. No hydrocephalus. The basilar cisterns are patent. No evidence of territorial infarct or acute ischemia. No extra-axial or intracranial fluid collection. Vascular: No hyperdense vessel or unexpected calcification. Skull: No fracture or focal lesion. Sinuses/Orbits: No acute finding. Other: Posterior vertex scalp scarring. IMPRESSION: No acute intracranial abnormality. Electronically Signed   By: Narda Rutherford M.D.   On: 07/28/2023 21:04   CT Head Wo Contrast Result Date: 07/15/2023 CLINICAL DATA:  Seizure, new-onset, no history of trauma Pt reports having a seizure today that lasted 45 seconds. Pt stiffened up and was out of it for about 20 minutes. Pt  reports taking his medication as prescribed but has taken an unprescribed klonopin." EXAM: CT HEAD WITHOUT CONTRAST TECHNIQUE: Contiguous axial images were obtained from the base of the  skull through the vertex without intravenous contrast. RADIATION DOSE REDUCTION: This exam was performed according to the departmental dose-optimization program which includes automated exposure control, adjustment of the mA and/or kV according to patient size and/or use of iterative reconstruction technique. COMPARISON:  CT head 03/29/2023, MRI head 07/31/2021 FINDINGS: Brain: No evidence of large-territorial acute infarction. No parenchymal hemorrhage. No mass lesion. No extra-axial collection. No mass effect or midline shift. No hydrocephalus. Basilar cisterns are patent. Vascular: No hyperdense vessel. Skull: No acute fracture or focal lesion. Sinuses/Orbits: Paranasal sinuses and mastoid air cells are clear. The orbits are unremarkable. Other: None. IMPRESSION: No acute intracranial abnormality. Electronically Signed   By: Tish Frederickson M.D.   On: 07/15/2023 18:17      Subjective: Patient denies any complaints today, there is no recurrent seizures  Discharge Exam: Vitals:   08/01/23 0342 08/01/23 0800  BP: (!) 146/76 (!) 142/50  Pulse: 69   Resp: 18 18  Temp: 97.7 F (36.5 C) (!) 97.5 F (36.4 C)  SpO2: 91% 94%   Vitals:   07/31/23 1938 07/31/23 2321 08/01/23 0342 08/01/23 0800  BP: (!) 132/93 (!) 139/91 (!) 146/76 (!) 142/50  Pulse: 76  69   Resp:   18 18  Temp: 97.7 F (36.5 C) 98 F (36.7 C) 97.7 F (36.5 C) (!) 97.5 F (36.4 C)  TempSrc: Oral Oral Oral Oral  SpO2: 90% 93% 91% 94%  Weight:      Height:        General: Pt is alert, awake, not in acute distress Cardiovascular: RRR, S1/S2 +, no rubs, no gallops Respiratory: CTA bilaterally, no wheezing, no rhonchi Abdominal: Soft, NT, ND, bowel sounds + Extremities: no edema, no cyanosis    The results of significant diagnostics from this hospitalization (including imaging, microbiology, ancillary and laboratory) are listed below for reference.     Microbiology: No results found for this or any previous visit  (from the past 240 hours).   Labs: BNP (last 3 results) No results for input(s): "BNP" in the last 8760 hours. Basic Metabolic Panel: Recent Labs  Lab 07/27/23 1253 07/28/23 1833 07/29/23 0807 07/30/23 0655 07/31/23 0500  NA 139 138 144 139 136  K 3.7 3.4* 2.9* 3.6 3.6  CL 107 104 111 107 106  CO2 22 24 23 23 23   GLUCOSE 110* 99 79 91 103*  BUN 9 7 10 14 16   CREATININE 0.99 0.88 0.99 0.87 0.99  CALCIUM 8.7* 8.9 7.5* 8.9 8.8*  MG  --   --  1.8  --   --   PHOS  --   --  3.5  --   --    Liver Function Tests: Recent Labs  Lab 07/27/23 1253  AST 24  ALT 25  ALKPHOS 78  BILITOT 1.5*  PROT 7.4  ALBUMIN 3.9   No results for input(s): "LIPASE", "AMYLASE" in the last 168 hours. No results for input(s): "AMMONIA" in the last 168 hours. CBC: Recent Labs  Lab 07/27/23 1253 07/28/23 1833 07/29/23 0807 07/30/23 0655 07/31/23 0500  WBC 6.7 6.2 6.2 4.4 4.7  NEUTROABS 4.8 3.5  --   --   --   HGB 16.0 15.4 15.8 15.7 15.6  HCT 48.7 46.6 48.6 46.9 45.4  MCV 99.0 97.3 98.8 97.1 95.6  PLT 192 207 183 180  204   Cardiac Enzymes: No results for input(s): "CKTOTAL", "CKMB", "CKMBINDEX", "TROPONINI" in the last 168 hours. BNP: Invalid input(s): "POCBNP" CBG: Recent Labs  Lab 07/27/23 1248  GLUCAP 116*   D-Dimer No results for input(s): "DDIMER" in the last 72 hours. Hgb A1c No results for input(s): "HGBA1C" in the last 72 hours. Lipid Profile No results for input(s): "CHOL", "HDL", "LDLCALC", "TRIG", "CHOLHDL", "LDLDIRECT" in the last 72 hours. Thyroid function studies No results for input(s): "TSH", "T4TOTAL", "T3FREE", "THYROIDAB" in the last 72 hours.  Invalid input(s): "FREET3" Anemia work up No results for input(s): "VITAMINB12", "FOLATE", "FERRITIN", "TIBC", "IRON", "RETICCTPCT" in the last 72 hours. Urinalysis    Component Value Date/Time   COLORURINE YELLOW 04/30/2023 1700   APPEARANCEUR CLEAR 04/30/2023 1700   LABSPEC 1.027 04/30/2023 1700   PHURINE 5.0  04/30/2023 1700   GLUCOSEU NEGATIVE 04/30/2023 1700   HGBUR NEGATIVE 04/30/2023 1700   BILIRUBINUR NEGATIVE 04/30/2023 1700   BILIRUBINUR negative 09/25/2014 1641   KETONESUR 20 (A) 04/30/2023 1700   PROTEINUR NEGATIVE 04/30/2023 1700   UROBILINOGEN 4.0 09/25/2014 1641   UROBILINOGEN 0.2 08/19/2014 1152   NITRITE NEGATIVE 04/30/2023 1700   LEUKOCYTESUR NEGATIVE 04/30/2023 1700   Sepsis Labs Recent Labs  Lab 07/28/23 1833 07/29/23 0807 07/30/23 0655 07/31/23 0500  WBC 6.2 6.2 4.4 4.7   Microbiology No results found for this or any previous visit (from the past 240 hours).   Time coordinating discharge: Over 30 minutes  SIGNED:   Huey Bienenstock, MD  [facility] 08/01/2023, 2:48 PM Pager   If 7PM-7AM, please contact night-coverage [email or web address] Password [email or web address]

## 2023-08-01 NOTE — Progress Notes (Signed)
Discharge instructions discussed, education provided, IV removed, catheter tip intact, tolerated well, patient will be escorted to discharge lounge.

## 2023-09-06 ENCOUNTER — Ambulatory Visit: Payer: MEDICAID | Admitting: Neurology

## 2023-10-06 ENCOUNTER — Ambulatory Visit: Payer: MEDICAID | Admitting: Internal Medicine

## 2023-11-09 NOTE — Progress Notes (Signed)
 Notified by Jesslyn Moro with [facility] that patient is currently incarcerated and needs assistance getting his medical records to [facility].   ROI on file in media tab. Faxed last two office notes and last two sets of labs.   Pheobe Brass: [phone] F: [phone]   Arlon Bergamo, BSN, RN

## 2023-11-30 NOTE — Progress Notes (Signed)
 The 10-year ASCVD risk score (Arnett DK, et al., 2019) is: 7.1%   Values used to calculate the score:     Age: 43 years     Sex: Male     Is Non-Hispanic African American: Yes     Diabetic: No     Tobacco smoker: Yes     Systolic Blood Pressure: 142 mmHg     Is BP treated: No     HDL Cholesterol: 44 mg/dL     Total Cholesterol: 173 mg/dL  No current statin therapy, no upcoming appointment.   Hussien Greenblatt, BSN, RN

## 2024-05-16 ENCOUNTER — Ambulatory Visit: Payer: MEDICAID | Admitting: Infectious Diseases

## 2024-07-02 ENCOUNTER — Ambulatory Visit: Payer: MEDICAID | Admitting: Family
